# Patient Record
Sex: Female | Born: 1970 | Race: Black or African American | Hispanic: No | Marital: Single | Smoking: Former smoker
Health system: Southern US, Community
[De-identification: ages and names within clinical notes are randomized; demographics above are authoritative.]

## PROBLEM LIST (undated history)

## (undated) DIAGNOSIS — G4730 Sleep apnea, unspecified: Secondary | ICD-10-CM

## (undated) DIAGNOSIS — M1990 Unspecified osteoarthritis, unspecified site: Secondary | ICD-10-CM

## (undated) DIAGNOSIS — M25569 Pain in unspecified knee: Secondary | ICD-10-CM

## (undated) DIAGNOSIS — F319 Bipolar disorder, unspecified: Secondary | ICD-10-CM

## (undated) DIAGNOSIS — F329 Major depressive disorder, single episode, unspecified: Secondary | ICD-10-CM

## (undated) DIAGNOSIS — R51 Headache: Secondary | ICD-10-CM

## (undated) DIAGNOSIS — IMO0001 Reserved for inherently not codable concepts without codable children: Secondary | ICD-10-CM

## (undated) DIAGNOSIS — G4733 Obstructive sleep apnea (adult) (pediatric): Secondary | ICD-10-CM

## (undated) DIAGNOSIS — J189 Pneumonia, unspecified organism: Secondary | ICD-10-CM

## (undated) DIAGNOSIS — F4310 Post-traumatic stress disorder, unspecified: Secondary | ICD-10-CM

## (undated) DIAGNOSIS — F32A Depression, unspecified: Secondary | ICD-10-CM

## (undated) DIAGNOSIS — R519 Headache, unspecified: Secondary | ICD-10-CM

## (undated) DIAGNOSIS — F419 Anxiety disorder, unspecified: Secondary | ICD-10-CM

## (undated) DIAGNOSIS — G8929 Other chronic pain: Secondary | ICD-10-CM

## (undated) DIAGNOSIS — E785 Hyperlipidemia, unspecified: Secondary | ICD-10-CM

## (undated) DIAGNOSIS — J45909 Unspecified asthma, uncomplicated: Secondary | ICD-10-CM

## (undated) DIAGNOSIS — T4145XA Adverse effect of unspecified anesthetic, initial encounter: Secondary | ICD-10-CM

## (undated) DIAGNOSIS — M25579 Pain in unspecified ankle and joints of unspecified foot: Secondary | ICD-10-CM

## (undated) DIAGNOSIS — I10 Essential (primary) hypertension: Secondary | ICD-10-CM

## (undated) DIAGNOSIS — T8859XA Other complications of anesthesia, initial encounter: Secondary | ICD-10-CM

## (undated) DIAGNOSIS — M75100 Unspecified rotator cuff tear or rupture of unspecified shoulder, not specified as traumatic: Secondary | ICD-10-CM

## (undated) DIAGNOSIS — K219 Gastro-esophageal reflux disease without esophagitis: Secondary | ICD-10-CM

## (undated) DIAGNOSIS — Z9989 Dependence on other enabling machines and devices: Secondary | ICD-10-CM

## (undated) HISTORY — DX: Obstructive sleep apnea (adult) (pediatric): G47.33

## (undated) HISTORY — DX: Dependence on other enabling machines and devices: Z99.89

## (undated) HISTORY — DX: Pain in unspecified ankle and joints of unspecified foot: M25.579

## (undated) HISTORY — PX: HERNIA REPAIR: SHX51

## (undated) HISTORY — PX: TUBAL LIGATION: SHX77

## (undated) HISTORY — PX: CERVICAL LAMINECTOMY: SHX94

---

## 1999-06-19 HISTORY — PX: CARPAL TUNNEL RELEASE: SHX101

## 2000-08-08 ENCOUNTER — Encounter: Admission: RE | Admit: 2000-08-08 | Discharge: 2000-11-06 | Payer: Self-pay | Admitting: Specialist

## 2000-08-22 ENCOUNTER — Emergency Department (HOSPITAL_COMMUNITY): Admission: EM | Admit: 2000-08-22 | Discharge: 2000-08-23 | Payer: Self-pay | Admitting: Emergency Medicine

## 2000-12-10 ENCOUNTER — Emergency Department (HOSPITAL_COMMUNITY): Admission: EM | Admit: 2000-12-10 | Discharge: 2000-12-11 | Payer: Self-pay | Admitting: Emergency Medicine

## 2000-12-30 ENCOUNTER — Encounter: Payer: Self-pay | Admitting: Emergency Medicine

## 2000-12-30 ENCOUNTER — Emergency Department (HOSPITAL_COMMUNITY): Admission: EM | Admit: 2000-12-30 | Discharge: 2000-12-30 | Payer: Self-pay | Admitting: Emergency Medicine

## 2001-01-07 ENCOUNTER — Emergency Department (HOSPITAL_COMMUNITY): Admission: EM | Admit: 2001-01-07 | Discharge: 2001-01-07 | Payer: Self-pay | Admitting: Emergency Medicine

## 2001-01-07 ENCOUNTER — Encounter: Payer: Self-pay | Admitting: Emergency Medicine

## 2001-02-21 ENCOUNTER — Emergency Department (HOSPITAL_COMMUNITY): Admission: EM | Admit: 2001-02-21 | Discharge: 2001-02-22 | Payer: Self-pay | Admitting: Emergency Medicine

## 2006-09-22 ENCOUNTER — Emergency Department: Payer: Self-pay | Admitting: Emergency Medicine

## 2006-09-22 ENCOUNTER — Other Ambulatory Visit: Payer: Self-pay

## 2007-01-20 ENCOUNTER — Emergency Department: Payer: Self-pay | Admitting: Internal Medicine

## 2007-03-17 ENCOUNTER — Ambulatory Visit: Payer: Self-pay | Admitting: General Surgery

## 2007-06-14 ENCOUNTER — Emergency Department: Payer: Self-pay | Admitting: Emergency Medicine

## 2008-05-11 ENCOUNTER — Ambulatory Visit: Payer: Self-pay | Admitting: Internal Medicine

## 2008-09-01 ENCOUNTER — Emergency Department: Payer: Self-pay | Admitting: Emergency Medicine

## 2009-08-14 ENCOUNTER — Emergency Department: Payer: Self-pay | Admitting: Emergency Medicine

## 2009-08-26 ENCOUNTER — Emergency Department: Payer: Self-pay | Admitting: Emergency Medicine

## 2010-02-02 ENCOUNTER — Emergency Department: Payer: Self-pay | Admitting: Emergency Medicine

## 2010-02-14 ENCOUNTER — Emergency Department: Payer: Self-pay | Admitting: Emergency Medicine

## 2010-02-23 ENCOUNTER — Emergency Department: Payer: Self-pay | Admitting: Internal Medicine

## 2010-04-05 ENCOUNTER — Emergency Department: Payer: Self-pay | Admitting: Emergency Medicine

## 2010-11-01 ENCOUNTER — Emergency Department (HOSPITAL_COMMUNITY): Payer: Self-pay

## 2010-11-01 ENCOUNTER — Emergency Department (HOSPITAL_COMMUNITY)
Admission: EM | Admit: 2010-11-01 | Discharge: 2010-11-01 | Disposition: A | Discharge status: Home / Self Care | Payer: Self-pay | Attending: Emergency Medicine | Admitting: Emergency Medicine

## 2010-11-01 DIAGNOSIS — Z79899 Other long term (current) drug therapy: Secondary | ICD-10-CM | POA: Insufficient documentation

## 2010-11-01 DIAGNOSIS — F1010 Alcohol abuse, uncomplicated: Secondary | ICD-10-CM | POA: Insufficient documentation

## 2010-11-01 DIAGNOSIS — I10 Essential (primary) hypertension: Secondary | ICD-10-CM | POA: Insufficient documentation

## 2010-11-01 DIAGNOSIS — E119 Type 2 diabetes mellitus without complications: Secondary | ICD-10-CM | POA: Insufficient documentation

## 2010-11-01 DIAGNOSIS — R4182 Altered mental status, unspecified: Secondary | ICD-10-CM | POA: Insufficient documentation

## 2010-11-01 LAB — GLUCOSE, CAPILLARY
Glucose-Capillary: 225 mg/dL — ABNORMAL HIGH (ref 70–99)
Glucose-Capillary: 203 mg/dL — ABNORMAL HIGH (ref 70–99)

## 2010-11-01 LAB — COMPREHENSIVE METABOLIC PANEL
Alkaline Phosphatase: 77 U/L (ref 39–117)
BUN: 9 mg/dL (ref 6–23)
Chloride: 102 mEq/L (ref 96–112)
Glucose, Bld: 205 mg/dL — ABNORMAL HIGH (ref 70–99)
Potassium: 3.6 mEq/L (ref 3.5–5.1)
Total Bilirubin: 0.2 mg/dL — ABNORMAL LOW (ref 0.3–1.2)
Total Protein: 7.2 g/dL (ref 6.0–8.3)

## 2010-11-01 LAB — ETHANOL: Alcohol, Ethyl (B): 112 mg/dL — ABNORMAL HIGH (ref 0–10)

## 2010-11-01 LAB — CBC
HCT: 39.1 % (ref 36.0–46.0)
Hemoglobin: 12.8 g/dL (ref 12.0–15.0)
MCH: 26.3 pg (ref 26.0–34.0)
MCHC: 32.7 g/dL (ref 30.0–36.0)
MCV: 80.3 fL (ref 78.0–100.0)
Platelets: 211 10*3/uL (ref 150–400)
RBC: 4.87 MIL/uL (ref 3.87–5.11)
RDW: 14.5 % (ref 11.5–15.5)
WBC: 6.9 10*3/uL (ref 4.0–10.5)

## 2010-11-01 LAB — DIFFERENTIAL
Basophils Absolute: 0 10*3/uL (ref 0.0–0.1)
Basophils Relative: 0 % (ref 0–1)
Monocytes Absolute: 0.4 10*3/uL (ref 0.1–1.0)
Neutro Abs: 3.6 10*3/uL (ref 1.7–7.7)
Neutrophils Relative %: 52 % (ref 43–77)

## 2010-11-01 LAB — RAPID URINE DRUG SCREEN, HOSP PERFORMED
Amphetamines: NOT DETECTED
Barbiturates: NOT DETECTED
Cocaine: NOT DETECTED
Opiates: NOT DETECTED

## 2010-11-01 LAB — URINALYSIS, ROUTINE W REFLEX MICROSCOPIC
Bilirubin Urine: NEGATIVE
Glucose, UA: NEGATIVE mg/dL
Hgb urine dipstick: NEGATIVE
Ketones, ur: NEGATIVE mg/dL
Nitrite: NEGATIVE
Protein, ur: NEGATIVE mg/dL
Specific Gravity, Urine: 1.007 (ref 1.005–1.030)
Urobilinogen, UA: 0.2 mg/dL (ref 0.0–1.0)
pH: 6.5 (ref 5.0–8.0)

## 2010-11-01 LAB — POCT CARDIAC MARKERS
CKMB, poc: 1.4 ng/mL (ref 1.0–8.0)
Myoglobin, poc: 71.3 ng/mL (ref 12–200)
Troponin i, poc: <0.05 ng/mL (ref 0.00–0.09)

## 2010-11-02 ENCOUNTER — Emergency Department (HOSPITAL_COMMUNITY)
Admission: EM | Admit: 2010-11-02 | Discharge: 2010-11-02 | Disposition: A | Discharge status: Home / Self Care | Payer: Self-pay | Attending: Emergency Medicine | Admitting: Emergency Medicine

## 2010-11-02 ENCOUNTER — Emergency Department (HOSPITAL_COMMUNITY): Payer: Self-pay

## 2010-11-02 DIAGNOSIS — R5383 Other fatigue: Secondary | ICD-10-CM | POA: Insufficient documentation

## 2010-11-02 DIAGNOSIS — R112 Nausea with vomiting, unspecified: Secondary | ICD-10-CM | POA: Insufficient documentation

## 2010-11-02 DIAGNOSIS — R5381 Other malaise: Secondary | ICD-10-CM | POA: Insufficient documentation

## 2010-11-02 DIAGNOSIS — R079 Chest pain, unspecified: Secondary | ICD-10-CM | POA: Insufficient documentation

## 2010-11-02 DIAGNOSIS — I10 Essential (primary) hypertension: Secondary | ICD-10-CM | POA: Insufficient documentation

## 2010-11-02 DIAGNOSIS — E119 Type 2 diabetes mellitus without complications: Secondary | ICD-10-CM | POA: Insufficient documentation

## 2010-11-02 DIAGNOSIS — Z79899 Other long term (current) drug therapy: Secondary | ICD-10-CM | POA: Insufficient documentation

## 2010-11-02 LAB — COMPREHENSIVE METABOLIC PANEL
ALT: 15 U/L (ref 0–35)
CO2: 29 mEq/L (ref 19–32)
Calcium: 9.7 mg/dL (ref 8.4–10.5)
Creatinine, Ser: 0.87 mg/dL (ref 0.4–1.2)
GFR calc non Af Amer: >60 mL/min (ref >60)
Glucose, Bld: 176 mg/dL — ABNORMAL HIGH (ref 70–99)
Sodium: 137 mEq/L (ref 135–145)

## 2010-11-02 LAB — POCT PREGNANCY, URINE: Preg Test, Ur: NEGATIVE

## 2010-11-02 LAB — DIFFERENTIAL
Basophils Absolute: 0 10*3/uL (ref 0.0–0.1)
Eosinophils Absolute: 0.1 10*3/uL (ref 0.0–0.7)
Eosinophils Relative: 1 % (ref 0–5)

## 2010-11-02 LAB — CBC
Platelets: 199 10*3/uL (ref 150–400)
RDW: 15 % (ref 11.5–15.5)
WBC: 6.1 10*3/uL (ref 4.0–10.5)

## 2010-11-02 LAB — GLUCOSE, CAPILLARY
Glucose-Capillary: 174 mg/dL — ABNORMAL HIGH (ref 70–99)
Glucose-Capillary: 118 mg/dL — ABNORMAL HIGH (ref 70–99)

## 2010-11-02 LAB — LIPASE, BLOOD: Lipase: 17 U/L (ref 11–59)

## 2010-11-02 LAB — URINALYSIS, ROUTINE W REFLEX MICROSCOPIC
Bilirubin Urine: NEGATIVE
Nitrite: NEGATIVE
Specific Gravity, Urine: 1.021 (ref 1.005–1.030)
Urobilinogen, UA: 0.2 mg/dL (ref 0.0–1.0)

## 2010-11-02 LAB — POCT CARDIAC MARKERS: Myoglobin, poc: 72.6 ng/mL (ref 12–200)

## 2010-11-02 LAB — URINE MICROSCOPIC-ADD ON

## 2010-11-21 ENCOUNTER — Emergency Department (HOSPITAL_COMMUNITY)
Admission: EM | Admit: 2010-11-21 | Discharge: 2010-11-22 | Disposition: A | Discharge status: Home / Self Care | Payer: Medicaid Other | Attending: Emergency Medicine | Admitting: Emergency Medicine

## 2010-11-21 ENCOUNTER — Emergency Department (HOSPITAL_COMMUNITY): Payer: Medicaid Other

## 2010-11-21 DIAGNOSIS — IMO0002 Reserved for concepts with insufficient information to code with codable children: Secondary | ICD-10-CM | POA: Insufficient documentation

## 2010-11-21 DIAGNOSIS — M25569 Pain in unspecified knee: Secondary | ICD-10-CM | POA: Insufficient documentation

## 2010-11-21 DIAGNOSIS — I10 Essential (primary) hypertension: Secondary | ICD-10-CM | POA: Insufficient documentation

## 2010-11-21 DIAGNOSIS — M25469 Effusion, unspecified knee: Secondary | ICD-10-CM | POA: Insufficient documentation

## 2010-11-21 DIAGNOSIS — E669 Obesity, unspecified: Secondary | ICD-10-CM | POA: Insufficient documentation

## 2010-11-21 DIAGNOSIS — E119 Type 2 diabetes mellitus without complications: Secondary | ICD-10-CM | POA: Insufficient documentation

## 2010-11-21 DIAGNOSIS — M1710 Unilateral primary osteoarthritis, unspecified knee: Secondary | ICD-10-CM | POA: Insufficient documentation

## 2010-12-05 ENCOUNTER — Emergency Department (HOSPITAL_COMMUNITY)
Admission: EM | Admit: 2010-12-05 | Discharge: 2010-12-05 | Disposition: A | Discharge status: Home / Self Care | Payer: Worker's Compensation | Attending: Emergency Medicine | Admitting: Emergency Medicine

## 2010-12-05 DIAGNOSIS — S61209A Unspecified open wound of unspecified finger without damage to nail, initial encounter: Secondary | ICD-10-CM | POA: Insufficient documentation

## 2010-12-05 DIAGNOSIS — E119 Type 2 diabetes mellitus without complications: Secondary | ICD-10-CM | POA: Insufficient documentation

## 2010-12-05 DIAGNOSIS — Y9289 Other specified places as the place of occurrence of the external cause: Secondary | ICD-10-CM | POA: Insufficient documentation

## 2010-12-05 DIAGNOSIS — M79609 Pain in unspecified limb: Secondary | ICD-10-CM | POA: Insufficient documentation

## 2010-12-05 DIAGNOSIS — W260XXA Contact with knife, initial encounter: Secondary | ICD-10-CM | POA: Insufficient documentation

## 2010-12-05 DIAGNOSIS — I10 Essential (primary) hypertension: Secondary | ICD-10-CM | POA: Insufficient documentation

## 2010-12-05 DIAGNOSIS — E669 Obesity, unspecified: Secondary | ICD-10-CM | POA: Insufficient documentation

## 2011-07-26 ENCOUNTER — Encounter (HOSPITAL_COMMUNITY): Payer: Self-pay | Admitting: Emergency Medicine

## 2011-07-26 ENCOUNTER — Emergency Department (HOSPITAL_COMMUNITY): Payer: Medicaid Other

## 2011-07-26 ENCOUNTER — Emergency Department (HOSPITAL_COMMUNITY)
Admission: EM | Admit: 2011-07-26 | Discharge: 2011-07-26 | Disposition: A | Discharge status: Home / Self Care | Payer: Medicaid Other | Attending: Emergency Medicine | Admitting: Emergency Medicine

## 2011-07-26 DIAGNOSIS — I10 Essential (primary) hypertension: Secondary | ICD-10-CM | POA: Insufficient documentation

## 2011-07-26 DIAGNOSIS — R059 Cough, unspecified: Secondary | ICD-10-CM | POA: Insufficient documentation

## 2011-07-26 DIAGNOSIS — Z79899 Other long term (current) drug therapy: Secondary | ICD-10-CM | POA: Insufficient documentation

## 2011-07-26 DIAGNOSIS — J45909 Unspecified asthma, uncomplicated: Secondary | ICD-10-CM | POA: Insufficient documentation

## 2011-07-26 DIAGNOSIS — J3489 Other specified disorders of nose and nasal sinuses: Secondary | ICD-10-CM | POA: Insufficient documentation

## 2011-07-26 DIAGNOSIS — E119 Type 2 diabetes mellitus without complications: Secondary | ICD-10-CM | POA: Insufficient documentation

## 2011-07-26 DIAGNOSIS — R05 Cough: Secondary | ICD-10-CM | POA: Insufficient documentation

## 2011-07-26 HISTORY — DX: Essential (primary) hypertension: I10

## 2011-07-26 MED ORDER — IPRATROPIUM BROMIDE 0.02 % IN SOLN
0.5000 mg | Freq: Once | RESPIRATORY_TRACT | Status: AC
  Administered 2011-07-26: 0.5 mg via RESPIRATORY_TRACT
  Filled 2011-07-26: qty 2.5

## 2011-07-26 MED ORDER — ALBUTEROL SULFATE (5 MG/ML) 0.5% IN NEBU
5.0000 mg | INHALATION_SOLUTION | Freq: Once | RESPIRATORY_TRACT | Status: AC
  Administered 2011-07-26: 5 mg via RESPIRATORY_TRACT
  Filled 2011-07-26: qty 1

## 2011-07-26 MED ORDER — ALBUTEROL SULFATE HFA 108 (90 BASE) MCG/ACT IN AERS: 2.0000 | INHALATION_SPRAY | RESPIRATORY_TRACT | Status: DC | PRN

## 2011-07-26 MED ORDER — PREDNISONE 10 MG PO TABS: 20.0000 mg | ORAL_TABLET | Freq: Every day | ORAL | Status: DC

## 2011-07-26 MED ORDER — PREDNISONE 20 MG PO TABS
60.0000 mg | ORAL_TABLET | Freq: Once | ORAL | Status: AC
  Administered 2011-07-26: 60 mg via ORAL
  Filled 2011-07-26: qty 3

## 2011-07-26 NOTE — ED Notes (Signed)
Pt c/o head congestion and cough x several days; pt sts some wheezing but no hx of asthma

## 2011-07-26 NOTE — ED Provider Notes (Signed)
History     CSN: [id number]  Arrival date & time 07/26/11  1048   First MD Initiated Contact with Patient 07/26/11 1143      Chief Complaint  Patient presents with  . URI     HPI Pt c/o head congestion and cough x several days; pt sts some wheezing but no hx of asthma   Past Medical History  Diagnosis Date  . Diabetes mellitus   . Hypertension     History reviewed. No pertinent past surgical history.  History reviewed. No pertinent family history.  History  Substance Use Topics  . Smoking status: Never Smoker   . Smokeless tobacco: Not on file  . Alcohol Use: No    OB History    Grav Para Term Preterm Abortions TAB SAB Ect Mult Living                  Review of Systems Negative except as noted in history of present illness Allergies  Review of patient's allergies indicates no known allergies.  Home Medications   Current Outpatient Rx  Name Route Sig Dispense Refill  . ENALAPRIL MALEATE 10 MG PO TABS Oral Take 10 mg by mouth daily.    Marland Kitchen GLIPIZIDE ER 10 MG PO TB24 Oral Take 10 mg by mouth daily.    Marland Kitchen METFORMIN HCL 500 MG PO TABS Oral Take 1,000 mg by mouth 2 (two) times daily with a meal.    . ALBUTEROL SULFATE HFA 108 (90 BASE) MCG/ACT IN AERS Inhalation Inhale 2 puffs into the lungs every 4 (four) hours as needed for wheezing. 1 Inhaler 0  . PREDNISONE 10 MG PO TABS Oral Take 2 tablets (20 mg total) by mouth daily. 8 tablet 0    BP 170/100  Pulse 68  Temp(Src) 97.6 F (36.4 C) (Oral)  Resp 20  SpO2 99%  LMP 07/24/2011  Physical Exam  Nursing note and vitals reviewed. Constitutional: She is oriented to person, place, and time. She appears well-developed and well-nourished. No distress.  HENT:  Head: Normocephalic and atraumatic.  Eyes: Pupils are equal, round, and reactive to light.  Neck: Normal range of motion.  Cardiovascular: Normal rate and intact distal pulses.   Pulmonary/Chest: Effort normal. No respiratory distress. She has wheezes. She  exhibits no tenderness.  Abdominal: Normal appearance. She exhibits no distension.  Musculoskeletal: Normal range of motion.  Neurological: She is alert and oriented to person, place, and time. No cranial nerve deficit.  Skin: Skin is warm and dry. No rash noted.  Psychiatric: She has a normal mood and affect. Her behavior is normal.    ED Course  Procedures (including critical care time)  Labs Reviewed - No data to display Dg Chest 2 View  07/26/2011  *RADIOLOGY REPORT*  Clinical Data: Cough and wheezing.  Fever.  CHEST - 2 VIEW  Comparison: Chest x-ray of 11/02/2010.  Findings: Lung volumes are normal.  No consolidative airspace disease.  No pleural effusions.  No pneumothorax.  No pulmonary nodule or mass noted.  Pulmonary vasculature and the cardiomediastinal silhouette are within normal limits.  IMPRESSION: 1. No radiographic evidence of acute cardiopulmonary disease.  Original Report Authenticated By: Florencia Reasons, M.D.     1. Asthma       MDM          Nelia Shi, MD 07/26/11 [phone]

## 2011-10-18 ENCOUNTER — Encounter (HOSPITAL_COMMUNITY): Payer: Self-pay | Admitting: *Deleted

## 2011-10-18 ENCOUNTER — Emergency Department (HOSPITAL_COMMUNITY)
Admission: EM | Admit: 2011-10-18 | Discharge: 2011-10-19 | Disposition: A | Discharge status: Home / Self Care | Payer: Medicaid Other | Attending: Emergency Medicine | Admitting: Emergency Medicine

## 2011-10-18 DIAGNOSIS — I10 Essential (primary) hypertension: Secondary | ICD-10-CM | POA: Insufficient documentation

## 2011-10-18 DIAGNOSIS — E119 Type 2 diabetes mellitus without complications: Secondary | ICD-10-CM | POA: Insufficient documentation

## 2011-10-18 DIAGNOSIS — R079 Chest pain, unspecified: Secondary | ICD-10-CM | POA: Insufficient documentation

## 2011-10-18 DIAGNOSIS — M79609 Pain in unspecified limb: Secondary | ICD-10-CM

## 2011-10-18 DIAGNOSIS — M7989 Other specified soft tissue disorders: Secondary | ICD-10-CM | POA: Insufficient documentation

## 2011-10-18 DIAGNOSIS — M25559 Pain in unspecified hip: Secondary | ICD-10-CM | POA: Insufficient documentation

## 2011-10-18 NOTE — ED Notes (Signed)
Patient with three weeks of right hip/side pain that radiates through her whole right side.  Pain is worse when patient puts weight on the right leg.

## 2011-10-19 ENCOUNTER — Ambulatory Visit (HOSPITAL_COMMUNITY)
Admission: RE | Admit: 2011-10-19 | Discharge: 2011-10-19 | Disposition: A | Discharge status: Home / Self Care | Payer: Medicaid Other | Source: Ambulatory Visit | Attending: Emergency Medicine | Admitting: Emergency Medicine

## 2011-10-19 ENCOUNTER — Emergency Department (HOSPITAL_COMMUNITY): Payer: Medicaid Other

## 2011-10-19 DIAGNOSIS — M79609 Pain in unspecified limb: Secondary | ICD-10-CM | POA: Insufficient documentation

## 2011-10-19 DIAGNOSIS — M79606 Pain in leg, unspecified: Secondary | ICD-10-CM

## 2011-10-19 LAB — URINALYSIS, ROUTINE W REFLEX MICROSCOPIC
Bilirubin Urine: NEGATIVE
Ketones, ur: NEGATIVE mg/dL
Nitrite: NEGATIVE
Urobilinogen, UA: 1 mg/dL (ref 0.0–1.0)

## 2011-10-19 LAB — URINE MICROSCOPIC-ADD ON

## 2011-10-19 MED ORDER — ONDANSETRON HCL 4 MG PO TABS: 4.0000 mg | ORAL_TABLET | Freq: Four times a day (QID) | ORAL | Status: DC

## 2011-10-19 MED ORDER — HYDROCODONE-ACETAMINOPHEN 5-325 MG PO TABS: 1.0000 | ORAL_TABLET | ORAL | Status: DC | PRN

## 2011-10-19 MED ORDER — ONDANSETRON 4 MG PO TBDP: 4.0000 mg | ORAL_TABLET | Freq: Once | ORAL | Status: DC

## 2011-10-19 MED ORDER — HYDROMORPHONE HCL PF 1 MG/ML IJ SOLN: 1.0000 mg | Freq: Once | INTRAMUSCULAR | Status: DC

## 2011-10-19 MED ORDER — ONDANSETRON HCL 4 MG/2ML IJ SOLN
4.0000 mg | Freq: Once | INTRAMUSCULAR | Status: AC
  Administered 2011-10-19: 4 mg via INTRAVENOUS
  Filled 2011-10-19: qty 2

## 2011-10-19 MED ORDER — HYDROMORPHONE HCL PF 1 MG/ML IJ SOLN
1.0000 mg | Freq: Once | INTRAMUSCULAR | Status: AC
  Administered 2011-10-19: 1 mg via INTRAVENOUS
  Filled 2011-10-19: qty 1

## 2011-10-19 NOTE — ED Provider Notes (Signed)
History     CSN: [id number]  Arrival date & time 10/18/11  2245   First MD Initiated Contact with Patient 10/18/11 2333      Chief Complaint  Patient presents with  . Hip Pain  . Chest Pain    (Consider location/radiation/quality/duration/timing/severity/associated sxs/prior treatment) HPI  Patient presents to the ED for severe right side calf pain that radiates up towards her hip, then up her back, towards her shoulder blades, and then around the side to her chest. She states that her pain has been going on for 3 weeks. She has been afebrile. She denies previous history of DVT but admits to a family history. She admits to her calf being very tender. She denies recent long trip, surgery, or cough/chest pressure.   Past Medical History  Diagnosis Date  . Diabetes mellitus   . Hypertension     History reviewed. No pertinent past surgical history.  History reviewed. No pertinent family history.  History  Substance Use Topics  . Smoking status: Never Smoker   . Smokeless tobacco: Not on file  . Alcohol Use: No    OB History    Grav Para Term Preterm Abortions TAB SAB Ect Mult Living                  Review of Systems   HEENT: denies blurry vision or change in hearing PULMONARY: Denies difficulty breathing and SOB CARDIAC: denies chest pain or heart palpitations MUSCULOSKELETAL:  denies being unable to ambulate ABDOMEN AL: denies abdominal pain GU: denies loss of bowel or urinary control NEURO: denies numbness and tingling in extremities   Allergies  Review of patient's allergies indicates no known allergies.  Home Medications   Current Outpatient Rx  Name Route Sig Dispense Refill  . ALBUTEROL SULFATE HFA 108 (90 BASE) MCG/ACT IN AERS Inhalation Inhale 2 puffs into the lungs every 4 (four) hours as needed for wheezing. 1 Inhaler 0  . ENALAPRIL MALEATE 10 MG PO TABS Oral Take 10 mg by mouth daily.    Marland Kitchen GLIPIZIDE ER 10 MG PO TB24 Oral Take 10 mg by mouth  daily.    Marland Kitchen METFORMIN HCL 500 MG PO TABS Oral Take 1,000 mg by mouth 2 (two) times daily with a meal.    . PREDNISONE 10 MG PO TABS Oral Take 2 tablets (20 mg total) by mouth daily. 8 tablet 0    BP 148/93  Pulse 84  Temp(Src) 98.1 F (36.7 C) (Oral)  Resp 16  SpO2 97%  Physical Exam  Nursing note and vitals reviewed. Constitutional: She appears well-developed and well-nourished. No distress.  HENT:  Head: Normocephalic and atraumatic.  Eyes: Pupils are equal, round, and reactive to light.  Neck: Normal range of motion. Neck supple.  Cardiovascular: Normal rate and regular rhythm.   Pulmonary/Chest: Effort normal.  Abdominal: Soft.  Musculoskeletal:       Right lower leg: She exhibits tenderness and swelling (less than 3 cm of swelling). She exhibits no bony tenderness, no edema, no deformity and no laceration.        Equal strength to bilateral lower extremities. Neurosensory function adequate to both legs. Skin color is normal. Skin is warm and moist. I see no step off deformity, no bony tenderness. Pt is able to ambulate without limp. Pain is relieved when sitting in certain positions. ROM is decreased due to pain. No crepitus, laceration, effusion, swelling.  Pulses are normal   Neurological: She is alert.  Skin: Skin is  warm and dry.    ED Course  Procedures (including critical care time)  Labs Reviewed  URINALYSIS, ROUTINE W REFLEX MICROSCOPIC - Abnormal; Notable for the following:    Protein, ur 100 (*)    All other components within normal limits  URINE MICROSCOPIC-ADD ON - Abnormal; Notable for the following:    Squamous Epithelial / LPF FEW (*)    All other components within normal limits  PREGNANCY, URINE   Dg Hip Complete Right  10/19/2011  *RADIOLOGY REPORT*  Clinical Data: Right-sided migratory hip pain  RIGHT HIP - COMPLETE 2+ VIEW  Comparison: None.  Findings:  No fracture or dislocation.  Mild degenerative changes of the right hip with joint space loss,  subchondral sclerosis and osteophytosis. Minimal enthesopathic change/ prior avulsion injury at the greater trochanter.  Limited visualization of the pelvis is normal. Limited visualization of the bilateral SI joints and pubic symphysis is normal.  Suspect mild degenerative change within the contralateral left hip. Limited visualization of lower lumbar spine is normal.  Regional bowel gas pattern is normal.  IMPRESSION: 1.  Mild degenerative change of the right hip.  2.   Minimal enthesopathic change/prior avulsion injury at the greater trochanter.  Original Report Authenticated By: Waynard Reeds, M.D.     1. Extremity pain       MDM   . Date: 10/19/2011  Rate: 76  Rhythm: normal sinus rhythm  QRS Axis: normal  Intervals: normal  ST/T Wave abnormalities: normal  Conduction Disutrbances:none  Narrative Interpretation: Probable left atrial abnormality  Old EKG Reviewed: unchanged Nov 02, 2010  Patients pain managed in ED with strong pain medication.   According to the [facility] Criteria patient is low risk for a DVT with a score of zero. Due to her continued pain and swelling, I will bring her back to [facility] tomorrow for lower extremity dopplers to r/o DVT although unlikely. Patients pain being treated in the ED. Patient also given pain prescription for home. Xray of the hip shows arthritis and an old mild avulsion fracture.   This patient was discussed with Dr. Fonnie Jarvis who agrees with my treatment and plan.  Pt has been advised of the symptoms that warrant their return to the ED. Patient has voiced understanding and has agreed to follow-up with the PCP or specialist.         Dorthula Matas, PA 10/19/11 [phone]

## 2011-10-19 NOTE — ED Notes (Addendum)
Pt stated that she has been having right calf pain x 3 weeks. Pain is now radiating to her rt hip and back x 2 weeks. Pt stated that she has not had any trauma or injury to rt leg or hip. No swelling or warmness to leg noted. She has also been having constant dull left chest pain x 4 days. No n/v. Pt stated that she has intermittent SOB with CP. CP does not radiate. Will continue to monitor.

## 2011-10-19 NOTE — Progress Notes (Signed)
VASCULAR LAB PRELIMINARY  PRELIMINARY  PRELIMINARY  PRELIMINARY  Bilateral lower extremity venous duplex completed.    Preliminary report: Bilateral:  No evidence of DVT, superficial thrombosis, or Baker's Cyst.   Shailah Gibbins D, RVS 10/19/2011, 2:13 PM

## 2011-10-19 NOTE — ED Notes (Signed)
EDP at bedside  

## 2011-10-19 NOTE — Discharge Instructions (Signed)
Deep Vein Thrombosis A deep vein thrombosis (DVT) is a blood clot (thrombus) that develops in a deep vein. A DVT is a clot in the deep, larger veins of the leg, arm, or pelvis. These are more dangerous than clots that might form in veins on the surface of the body. Deep vein thrombosis can lead to complications if the clot breaks off and travels in the bloodstream to the lungs. CAUSES Blood clots form in a vein for different reasons. Usually several things cause blood clots. They include:  The flow of blood slows down.   The inside of the vein is damaged in some way.   The person has a condition that makes blood clot more easily. These conditions may include:   Older age (especially over 75 years old).   Having a history of blood clots.   Having major or lengthy surgery. Hip surgery is particularly high-risk.   Breaking a hip or leg.   Sitting or lying still for a long time.   Cancer or cancer treatment.   Having a long, thin tube (catheter) placed inside a vein during a medical procedure.   Being overweight (obese).   Pregnancy and childbirth.   Medicines with estrogen.   Smoking.   Other circulation or heart problems.  SYMPTOMS When a clot forms, it can either partially or totally block the blood flow in that vein. Symptoms of a DVT can include:  Swelling of the leg or arm, especially if one side is much worse.   Warmth and redness of the leg or arm, especially if one side is much worse.   Pain in an arm or leg. If the clot is in the leg, symptoms may be more noticeable or worse when standing or walking.  If the blood clot travels to the lung, it may cause:  Shortness of breath.   Chest pain. The pain may be worsened by deep breaths.   Coughing up thick mucus (phlegm), possibly flecked with blood.  Anyone with these symptoms should get emergency medical treatment right away. Call your local emergency services (911 in [place] if you have these symptoms. DIAGNOSIS If  a DVT is suspected, your caregiver will take a full medical history. He or she will also perform a physical exam. Tests that also may be required include:  Studies of the clotting properties of the blood.   An ultrasound scan.   X-rays to show the flow of blood when special dye is injected into the veins (venography).   Studies of your lungs if you have any chest symptoms.  PREVENTION  Exercise the legs regularly. Take a brisk 30 minute walk every day.   Maintain a weight that is appropriate for your height.   Avoid sitting or lying in bed for long periods of time without moving your legs.   Women, particularly those over the age of 35, should consider the risks and benefits of taking estrogen medicines, including birth control pills.   Do not smoke, especially if you take estrogen medicines.   Long-distance travel can increase your risk. You should exercise your legs by walking or pumping the muscles every hour.   In hospital prevention:   Prevention may include medical and nonmedical measures.  TREATMENT  The most common treatment for DVT is blood thinning (anticoagulant) medicine, which reduces the blood's tendency to clot. Anticoagulants can stop new blood clots from forming and old ones from growing. They cannot dissolve existing clots. Your body does this by itself over time.   Anticoagulants can be given by mouth, by intravenous (IV) access, or by injection. Your caregiver will determine the best program for you.   Less commonly, clot-dissolving drugs (thrombolytics) are used to dissolve a DVT. They carry a high risk of bleeding, so they are used mainly in severe cases.   Very rarely, a blood clot in the leg needs to be removed surgically.   If you are unable to take anticoagulants, your caregiver may arrange for you to have a filter placed in a main vein in your belly (abdomen). This filter prevents clots from traveling to your lungs.  HOME CARE INSTRUCTIONS  Take all  medicines prescribed by your caregiver. Follow the directions carefully.   You will most likely continue taking anticoagulants after you leave the hospital. Your caregiver will advise you on the length of treatment (usually 3 to 6 months, sometimes for life).   Taking too much or too little of an anticoagulant is dangerous. While taking this type of medicine, you will need to have regular blood tests to be sure the dose is correct. The dose can change for many reasons. It is critically important that you take this medicine exactly as prescribed, and that you have blood tests exactly as directed.   Many foods can interfere with anticoagulants. These include foods high in vitamin K, such as spinach, kale, broccoli, cabbage, collard and turnip greens, Brussels sprouts, peas, cauliflower, seaweed, parsley, beef and pork liver, green tea, and soybean oil. Your caregiver should discuss limits on these foods with you or you should arrange a visit with a dietician to answer your questions.   Many medicines can interfere with anticoagulants. You must tell your caregiver about any and all medicines you take. This includes all vitamins and supplements. Be especially cautious with aspirin and anti-inflammatory medicines. Ask your caregiver before taking these.   Anticoagulants can have side effects, mostly excessive bruising or bleeding. You will need to hold pressure over cuts for longer than usual. Avoid alcoholic drinks or consume only very small amounts while taking this medicine.   If you are taking an anticoagulant:   Wear a medical alert bracelet.   Notify your dentist or other caregivers before procedures.   Avoid contact sports.   Ask your caregiver how soon you can go back to normal activities. Not being active can lead to new clots. Ask for a list of what you should and should not do.   Exercise your lower leg muscles. This is important while traveling.   You may need to wear compression  stockings. These are tight elastic stockings that apply pressure to the lower legs. This can help keep the blood in the legs from clotting.   If you are a smoker, you should quit.   Learn as much as you can about DVT.  SEEK MEDICAL CARE IF:  You have unusual bruising or any bleeding problems.   The swelling or pain in your affected arm or leg is not gradually improving.   You anticipate surgery or long-distance travel. You should get specific advice on DVT prevention.   You discover other family members with blood clots. This may require further testing for inherited diseases or conditions.  SEEK IMMEDIATE MEDICAL CARE IF:  You develop chest pain.   You develop severe shortness of breath.   You begin to cough up bloody mucus or phlegm (sputum).   You feel dizzy or faint.   You develop swelling or pain in the leg.   You have   breathing problems after traveling.  MAKE SURE YOU:  Understand these instructions.   Will watch your condition.   Will get help right away if you are not doing well or get worse.  Document Released: 06/04/2005 Document Revised: 05/24/2011 Document Reviewed: 07/27/2010 Exit[employer] Patient Information 2012 [employer], LLPain of Unknown Etiology (Pain Without a Known Cause) You have come to your caregiver because of pain. Pain can occur in any part of the body. Often there is not a definite cause. If your laboratory (blood or urine) work was normal and x-rays or other studies were normal, your caregiver may treat you without knowing the cause of the pain. An example of this is the headache. Most headaches are diagnosed by taking a history. This means your caregiver asks you questions about your headaches. Your caregiver determines a treatment based on your answers. Usually testing done for headaches is normal. Often testing is not done unless there is no response to medications. Regardless of where your pain is located today, you can be given medications to make you  comfortable. If no physical cause of pain can be found, most cases of pain will gradually leave as suddenly as they came.  If you have a painful condition and no reason can be found for the pain, It is importantthat you follow up with your caregiver. If the pain becomes worse or does not go away, it may be necessary to repeat tests and look further for a possible cause.  Only take over-the-counter or prescription medicines for pain, discomfort, or fever as directed by your caregiver.   For the protection of your privacy, test results can not be given over the phone. Make sure you receive the results of your test. Ask as to how these results are to be obtained if you have not been informed. It is your responsibility to obtain your test results.   You may continue all activities unless the activities cause more pain. When the pain lessens, it is important to gradually resume normal activities. Resume activities by beginning slowly and gradually increasing the intensity and duration of the activities or exercise. During periods of severe pain, bed-rest may be helpful. Lay or sit in any position that is comfortable.   Ice used for acute (sudden) conditions may be effective. Use a large plastic bag filled with ice and wrapped in a towel. This may provide pain relief.   See your caregiver for continued problems. They can help or refer you for exercises or physical therapy if necessary.  If you were given medications for your condition, do not drive, operate machinery or power tools, or sign legal documents for 24 hours. Do not drink alcohol, take sleeping pills, or take other medications that may interfere with treatment. See your caregiver immediately if you have pain that is becoming worse and not relieved by medications. Document Released: 02/27/2001 Document Revised: 05/24/2011 Document Reviewed: 06/04/2005 [facility] Patient Information 2012 [place], [place].C.

## 2011-10-19 NOTE — ED Notes (Signed)
EKG done by EMT R Manson Passey Old and New EKG given to Dr Fonnie Jarvis

## 2011-10-21 ENCOUNTER — Encounter (HOSPITAL_COMMUNITY): Payer: Self-pay | Admitting: Emergency Medicine

## 2011-10-21 ENCOUNTER — Emergency Department (HOSPITAL_COMMUNITY): Payer: Medicaid Other

## 2011-10-21 ENCOUNTER — Emergency Department (HOSPITAL_COMMUNITY)
Admission: EM | Admit: 2011-10-21 | Discharge: 2011-10-21 | Disposition: A | Discharge status: Home / Self Care | Payer: Medicaid Other | Attending: Emergency Medicine | Admitting: Emergency Medicine

## 2011-10-21 DIAGNOSIS — M79609 Pain in unspecified limb: Secondary | ICD-10-CM | POA: Insufficient documentation

## 2011-10-21 DIAGNOSIS — R0602 Shortness of breath: Secondary | ICD-10-CM | POA: Insufficient documentation

## 2011-10-21 DIAGNOSIS — I10 Essential (primary) hypertension: Secondary | ICD-10-CM | POA: Insufficient documentation

## 2011-10-21 DIAGNOSIS — M7989 Other specified soft tissue disorders: Secondary | ICD-10-CM | POA: Insufficient documentation

## 2011-10-21 DIAGNOSIS — R0789 Other chest pain: Secondary | ICD-10-CM | POA: Insufficient documentation

## 2011-10-21 DIAGNOSIS — M76891 Other specified enthesopathies of right lower limb, excluding foot: Secondary | ICD-10-CM

## 2011-10-21 DIAGNOSIS — E119 Type 2 diabetes mellitus without complications: Secondary | ICD-10-CM | POA: Insufficient documentation

## 2011-10-21 LAB — URINALYSIS, ROUTINE W REFLEX MICROSCOPIC
Bilirubin Urine: NEGATIVE
Ketones, ur: NEGATIVE mg/dL
Nitrite: NEGATIVE
Protein, ur: NEGATIVE mg/dL
Specific Gravity, Urine: 1.007 (ref 1.005–1.030)
Urobilinogen, UA: 1 mg/dL (ref 0.0–1.0)

## 2011-10-21 LAB — CBC
HCT: 40.8 % (ref 36.0–46.0)
MCV: 83.6 fL (ref 78.0–100.0)
Platelets: 194 10*3/uL (ref 150–400)
RBC: 4.88 MIL/uL (ref 3.87–5.11)
WBC: 7.3 10*3/uL (ref 4.0–10.5)

## 2011-10-21 LAB — COMPREHENSIVE METABOLIC PANEL
ALT: 14 U/L (ref 0–35)
AST: 13 U/L (ref 0–37)
Alkaline Phosphatase: 69 U/L (ref 39–117)
CO2: 24 mEq/L (ref 19–32)
Calcium: 9.4 mg/dL (ref 8.4–10.5)
Chloride: 102 mEq/L (ref 96–112)
GFR calc Af Amer: 85 mL/min — ABNORMAL LOW (ref >90)
GFR calc non Af Amer: 73 mL/min — ABNORMAL LOW (ref >90)
Glucose, Bld: 97 mg/dL (ref 70–99)
Sodium: 136 mEq/L (ref 135–145)
Total Bilirubin: 0.2 mg/dL — ABNORMAL LOW (ref 0.3–1.2)

## 2011-10-21 LAB — DIFFERENTIAL
Eosinophils Relative: 2 % (ref 0–5)
Lymphocytes Relative: 34 % (ref 12–46)
Lymphs Abs: 2.5 10*3/uL (ref 0.7–4.0)
Neutro Abs: 4.4 10*3/uL (ref 1.7–7.7)

## 2011-10-21 LAB — URINE MICROSCOPIC-ADD ON

## 2011-10-21 MED ORDER — NAPROXEN 500 MG PO TABS: 500.0000 mg | ORAL_TABLET | Freq: Two times a day (BID) | ORAL | Status: DC

## 2011-10-21 MED ORDER — HYDROMORPHONE HCL PF 2 MG/ML IJ SOLN
1.0000 mg | Freq: Once | INTRAMUSCULAR | Status: AC
  Administered 2011-10-21: 1 mg via INTRAMUSCULAR
  Filled 2011-10-21: qty 1

## 2011-10-21 NOTE — Discharge Instructions (Signed)
Tendinitis  Tendinitis is swelling and inflammation of the tendons. Tendons are band-like tissues that connect muscle to bone. Tendinitis commonly occurs in the:    Shoulders (rotator cuff).   Heels (Achilles tendon).   Elbows (triceps tendon).  CAUSES  Tendinitis is usually caused by overusing the tendon, muscles, and joints involved. When the tissue surrounding a tendon (synovium) becomes inflamed, it is called tenosynovitis. Tendinitis commonly develops in people whose jobs require repetitive motions.  SYMPTOMS   Pain.   Tenderness.   Mild swelling.  DIAGNOSIS  Tendinitis is usually diagnosed by physical exam. Your caregiver may also order X-rays or other imaging tests.  TREATMENT  Your caregiver may recommend certain medicines or exercises for your treatment.  HOME CARE INSTRUCTIONS    Use a sling or splint for as long as directed by your caregiver until the pain decreases.   Put ice on the injured area.   Put ice in a plastic bag.   Place a towel between your skin and the bag.   Leave the ice on for 15 to 20 minutes, 3 to 4 times a day.   Avoid using the limb while the tendon is painful. Perform gentle range of motion exercises only as directed by your caregiver. Stop exercises if pain or discomfort increase, unless directed otherwise by your caregiver.   Only take over-the-counter or prescription medicines for pain, discomfort, or fever as directed by your caregiver.  SEEK MEDICAL CARE IF:    Your pain and swelling increase.   You develop new, unexplained symptoms, especially increased numbness in the hands.  MAKE SURE YOU:    Understand these instructions.   Will watch your condition.   Will get help right away if you are not doing well or get worse.  Document Released: 06/01/2000 Document Revised: 05/24/2011 Document Reviewed: 08/21/2010  [employer] Patient Information 2012 [employer].

## 2011-10-21 NOTE — ED Notes (Signed)
C/o pain and swelling to posterior R upper leg x 3 weeks.  States she was seen in ED this past week and came back for doppler and that they did not see a blood clot.  States pain and swelling is now going up leg more, into back , and has bilateral shoulder pain.

## 2011-10-21 NOTE — ED Provider Notes (Signed)
Medical screening examination/treatment/procedure(s) were performed by non-physician practitioner and as supervising physician I was immediately available for consultation/collaboration.  Iban Utz R. Samyah Bilbo, MD 10/21/11 2323 

## 2011-10-21 NOTE — ED Provider Notes (Signed)
History     CSN: [id number]  Arrival date & time 10/21/11  1941   First MD Initiated Contact with Patient 10/21/11 2011      8:19 PM HPI Patient reports swelling of right posterior calf for 3 weeks. States she was seen here 3 days ago for same and had Dopplers done on leg. Dopplers were negative for blood clot. Reports persistent pain and swelling. States now she is also beginning to have some chest pressure and shortness of breath. Denies erythema of leg, mass, numbness, tingling, weakness. Reports chest pressure and shortness of breath is worse with laying flat. Denies known injury to your right leg.  Patient is a 41 y.o. female presenting with leg pain.  Leg Pain  Incident onset: 3 weeks ago. The injury mechanism is unknown. Pain location: right posterior calf. The pain is severe. The pain has been worsening since onset. Pertinent negatives include no numbness, no inability to bear weight, no loss of motion, no muscle weakness, no loss of sensation and no tingling. She reports no foreign bodies present. The symptoms are aggravated by palpation, activity and bearing weight. Treatments tried: vicodin. The treatment provided no relief.    Past Medical History  Diagnosis Date  . Diabetes mellitus   . Hypertension     History reviewed. No pertinent past surgical history.  No family history on file.  History  Substance Use Topics  . Smoking status: Never Smoker   . Smokeless tobacco: Not on file  . Alcohol Use: No    OB History    Grav Para Term Preterm Abortions TAB SAB Ect Mult Living                  Review of Systems  Constitutional: Negative for fever, chills, diaphoresis and fatigue.  HENT: Negative for neck pain.   Respiratory: Positive for shortness of breath. Negative for cough and wheezing.   Cardiovascular: Positive for chest pain. Negative for leg swelling.  Gastrointestinal: Negative for nausea, vomiting and abdominal pain.  Musculoskeletal: Negative for back  pain.       Calf pain and swelling  Neurological: Negative for dizziness, tingling, weakness, numbness and headaches.  All other systems reviewed and are negative.    Allergies  Review of patient's allergies indicates no known allergies.  Home Medications   Current Outpatient Rx  Name Route Sig Dispense Refill  . ALBUTEROL SULFATE HFA 108 (90 BASE) MCG/ACT IN AERS Inhalation Inhale 2 puffs into the lungs every 4 (four) hours as needed. For wheezing.    . ENALAPRIL MALEATE 10 MG PO TABS Oral Take 10 mg by mouth daily.    Marland Kitchen GLIPIZIDE ER 10 MG PO TB24 Oral Take 10 mg by mouth daily.    Marland Kitchen HYDROCODONE-ACETAMINOPHEN 5-325 MG PO TABS Oral Take 1 tablet by mouth every 4 (four) hours as needed. For pain.    Marland Kitchen METFORMIN HCL 500 MG PO TABS Oral Take 1,000 mg by mouth 2 (two) times daily with a meal.    . ONDANSETRON HCL 4 MG PO TABS Oral Take 4 mg by mouth every 6 (six) hours.    Marland Kitchen PREDNISONE 10 MG PO TABS Oral Take 2 tablets (20 mg total) by mouth daily. 8 tablet 0    BP 160/81  Pulse 73  Temp(Src) 97.9 F (36.6 C) (Oral)  Resp 20  SpO2 100%  LMP 10/19/2011  Physical Exam  Vitals reviewed. Constitutional: She is oriented to person, place, and time. Vital signs are normal. She  appears well-developed and well-nourished.  HENT:  Head: Normocephalic and atraumatic.  Eyes: Conjunctivae are normal. Pupils are equal, round, and reactive to light.  Neck: Normal range of motion. Neck supple.  Cardiovascular: Normal rate, regular rhythm and normal heart sounds.  Exam reveals no friction rub.   No murmur heard. Pulmonary/Chest: Effort normal and breath sounds normal. She has no wheezes. She has no rhonchi. She has no rales. She exhibits no tenderness.  Musculoskeletal: Normal range of motion.       Right lower leg: She exhibits tenderness. She exhibits no swelling (no pitting edema or edema noticible ), no edema, no deformity and no laceration.       Legs: Neurological: She is alert and  oriented to person, place, and time.  Skin: Skin is warm and dry. No rash noted. No erythema. No pallor.    ED Course  Procedures  Results for orders placed during the hospital encounter of 10/21/11  CBC      Component Value Range   WBC 7.3  4.0 - 10.5 (K/uL)   RBC 4.88  3.87 - 5.11 (MIL/uL)   Hemoglobin 13.5  12.0 - 15.0 (g/dL)   HCT 16.1  09.6 - 04.5 (%)   MCV 83.6  78.0 - 100.0 (fL)   MCH 27.7  26.0 - 34.0 (pg)   MCHC 33.1  30.0 - 36.0 (g/dL)   RDW 40.9  81.1 - 91.4 (%)   Platelets 194  150 - 400 (K/uL)  DIFFERENTIAL      Component Value Range   Neutrophils Relative 60  43 - 77 (%)   Neutro Abs 4.4  1.7 - 7.7 (K/uL)   Lymphocytes Relative 34  12 - 46 (%)   Lymphs Abs 2.5  0.7 - 4.0 (K/uL)   Monocytes Relative 5  3 - 12 (%)   Monocytes Absolute 0.3  0.1 - 1.0 (K/uL)   Eosinophils Relative 2  0 - 5 (%)   Eosinophils Absolute 0.1  0.0 - 0.7 (K/uL)   Basophils Relative 0  0 - 1 (%)   Basophils Absolute 0.0  0.0 - 0.1 (K/uL)  COMPREHENSIVE METABOLIC PANEL      Component Value Range   Sodium 136  135 - 145 (mEq/L)   Potassium 4.1  3.5 - 5.1 (mEq/L)   Chloride 102  96 - 112 (mEq/L)   CO2 24  19 - 32 (mEq/L)   Glucose, Bld 97  70 - 99 (mg/dL)   BUN 10  6 - 23 (mg/dL)   Creatinine, Ser 7.82  0.50 - 1.10 (mg/dL)   Calcium 9.4  8.4 - 95.6 (mg/dL)   Total Protein 7.3  6.0 - 8.3 (g/dL)   Albumin 3.8  3.5 - 5.2 (g/dL)   AST 13  0 - 37 (U/L)   ALT 14  0 - 35 (U/L)   Alkaline Phosphatase 69  39 - 117 (U/L)   Total Bilirubin 0.2 (*) 0.3 - 1.2 (mg/dL)   GFR calc non Af Amer 73 (*) >90 (mL/min)   GFR calc Af Amer 85 (*) >90 (mL/min)  URINALYSIS, ROUTINE W REFLEX MICROSCOPIC      Component Value Range   Color, Urine YELLOW  YELLOW    APPearance CLEAR  CLEAR    Specific Gravity, Urine 1.007  1.005 - 1.030    pH 6.5  5.0 - 8.0    Glucose, UA NEGATIVE  NEGATIVE (mg/dL)   Hgb urine dipstick LARGE (*) NEGATIVE    Bilirubin Urine NEGATIVE  NEGATIVE    Ketones, ur NEGATIVE  NEGATIVE  (mg/dL)   Protein, ur NEGATIVE  NEGATIVE (mg/dL)   Urobilinogen, UA 1.0  0.0 - 1.0 (mg/dL)   Nitrite NEGATIVE  NEGATIVE    Leukocytes, UA TRACE (*) NEGATIVE   POCT I-STAT TROPONIN I      Component Value Range   Troponin i, poc 0.00  0.00 - 0.08 (ng/mL)   Comment 3           POCT PREGNANCY, URINE      Component Value Range   Preg Test, Ur NEGATIVE  NEGATIVE   URINE MICROSCOPIC-ADD ON      Component Value Range   Squamous Epithelial / LPF FEW (*) RARE    WBC, UA 0-2  <3 (WBC/hpf)   RBC / HPF 3-6  <3 (RBC/hpf)   Bacteria, UA RARE  RARE    Dg Chest 2 View  10/21/2011  *RADIOLOGY REPORT*  Clinical Data: Chest pain and shortness of breath for 2 months.  CHEST - 2 VIEW  Comparison: Chest radiograph performed 07/26/2011  Findings: The lungs are well-aerated and clear.  There is no evidence of focal opacification, pleural effusion or pneumothorax.  The heart is borderline normal in size; the mediastinal contour is within normal limits.  No acute osseous abnormalities are seen.  IMPRESSION: No acute cardiopulmonary process seen.  Original Report Authenticated By: Tonia Ghent, M.D.   Dg Hip Complete Right  10/19/2011  *RADIOLOGY REPORT*  Clinical Data: Right-sided migratory hip pain  RIGHT HIP - COMPLETE 2+ VIEW  Comparison: None.  Findings:  No fracture or dislocation.  Mild degenerative changes of the right hip with joint space loss, subchondral sclerosis and osteophytosis. Minimal enthesopathic change/ prior avulsion injury at the greater trochanter.  Limited visualization of the pelvis is normal. Limited visualization of the bilateral SI joints and pubic symphysis is normal.  Suspect mild degenerative change within the contralateral left hip. Limited visualization of lower lumbar spine is normal.  Regional bowel gas pattern is normal.  IMPRESSION: 1.  Mild degenerative change of the right hip.  2.   Minimal enthesopathic change/prior avulsion injury at the greater trochanter.  Original Report  Authenticated By: Waynard Reeds, M.D.    Date: 10/21/2011  Rate: 74  Rhythm: normal sinus rhythm  QRS Axis: normal  Intervals: normal  ST/T Wave abnormalities: normal  Conduction Disutrbances: none   Old EKG Reviewed: No significant changes noted since 10/19/2011     MDM   Discussed normal labs with patient. Readdressed patient's chest pain. Patient states chest pain has been occurring persistently for several months without change. Low suspicion for acute coronary syndrome or pulmonary edema. Suspect patient has tendinitis as patient points to right lateral posterior knee overhead and fibula. Patient is currently pending Medicaid in we'll be following up with primary care physician seems she hadn't. Patient reports a significant history is standing all day because she works at [employer]. Advised anti-inflammatory medication in addition to pain medication. Will give patient 2 days off work and advised to rest elevate and use warm compresses on the lower chart many. Patient agrees with plan and is ready for discharge     Thomasene Lot, Cordelia Poche 10/21/11 2146  Thomasene Lot, PA-C 10/21/11 2229

## 2011-10-21 NOTE — ED Notes (Signed)
Patient transported to X-ray 

## 2011-10-22 NOTE — ED Provider Notes (Signed)
Medical screening examination/treatment/procedure(s) were performed by non-physician practitioner and as supervising physician I was immediately available for consultation/collaboration.  Halena Mohar M Trenia Tennyson, MD 10/22/11 2334 

## 2011-11-04 ENCOUNTER — Encounter (HOSPITAL_COMMUNITY): Payer: Self-pay | Admitting: Emergency Medicine

## 2011-11-04 ENCOUNTER — Emergency Department (HOSPITAL_COMMUNITY)
Admission: EM | Admit: 2011-11-04 | Discharge: 2011-11-04 | Disposition: A | Discharge status: Home / Self Care | Payer: Medicaid Other | Attending: Emergency Medicine | Admitting: Emergency Medicine

## 2011-11-04 DIAGNOSIS — E119 Type 2 diabetes mellitus without complications: Secondary | ICD-10-CM | POA: Insufficient documentation

## 2011-11-04 DIAGNOSIS — M79609 Pain in unspecified limb: Secondary | ICD-10-CM | POA: Insufficient documentation

## 2011-11-04 DIAGNOSIS — M79606 Pain in leg, unspecified: Secondary | ICD-10-CM

## 2011-11-04 DIAGNOSIS — I10 Essential (primary) hypertension: Secondary | ICD-10-CM | POA: Insufficient documentation

## 2011-11-04 DIAGNOSIS — Z79899 Other long term (current) drug therapy: Secondary | ICD-10-CM | POA: Insufficient documentation

## 2011-11-04 LAB — POCT I-STAT, CHEM 8
Creatinine, Ser: 0.9 mg/dL (ref 0.50–1.10)
HCT: 45 % (ref 36.0–46.0)
Hemoglobin: 15.3 g/dL — ABNORMAL HIGH (ref 12.0–15.0)
Potassium: 3.4 mEq/L — ABNORMAL LOW (ref 3.5–5.1)
Sodium: 139 mEq/L (ref 135–145)

## 2011-11-04 LAB — DIFFERENTIAL
Basophils Absolute: 0 10*3/uL (ref 0.0–0.1)
Basophils Relative: 0 % (ref 0–1)
Eosinophils Absolute: 0.1 10*3/uL (ref 0.0–0.7)
Lymphs Abs: 2.4 10*3/uL (ref 0.7–4.0)
Neutrophils Relative %: 67 % (ref 43–77)

## 2011-11-04 LAB — CBC
MCH: 27.3 pg (ref 26.0–34.0)
Platelets: 226 10*3/uL (ref 150–400)
RBC: 4.94 MIL/uL (ref 3.87–5.11)

## 2011-11-04 MED ORDER — OXYCODONE-ACETAMINOPHEN 5-325 MG PO TABS: 1.0000 | ORAL_TABLET | Freq: Four times a day (QID) | ORAL | Status: AC | PRN

## 2011-11-04 NOTE — ED Notes (Signed)
C/o pain to R leg that radiates up R side of back and into R shoulder x 3 weeks.  No known injury.  Pt states she had a x-ray before for same and believes pain is coming from her hip.

## 2011-11-04 NOTE — Discharge Instructions (Signed)
No definitive cause for your pain was identified tonight .  The test specifically designed to rule out blood clot is negative.  Please followup with your primary care physician as scheduled June 5

## 2011-11-04 NOTE — ED Provider Notes (Signed)
Medical screening examination/treatment/procedure(s) were performed by non-physician practitioner and as supervising physician I was immediately available for consultation/collaboration.   Glynn Octave, MD 11/04/11 [phone]

## 2011-11-04 NOTE — ED Provider Notes (Signed)
History     CSN: [id number]  Arrival date & time 11/04/11  2034   First MD Initiated Contact with Patient 11/04/11 2148      Chief Complaint  Patient presents with  . Leg Pain    (Consider location/radiation/quality/duration/timing/severity/associated sxs/prior treatment) HPI Comments: Patient has been having right leg pain that radiates from her calf up to her lower back persistently for quite some time.  She has been evaluated in the emergency department several times for this with negative findings including x-ray, blood count today.  I added a d-dimer to rule out a DVT.  This is negative.  She does have an appointment with her primary care physician on June 5  The history is provided by the patient.    Past Medical History  Diagnosis Date  . Diabetes mellitus   . Hypertension     History reviewed. No pertinent past surgical history.  No family history on file.  History  Substance Use Topics  . Smoking status: Never Smoker   . Smokeless tobacco: Not on file  . Alcohol Use: No    OB History    Grav Para Term Preterm Abortions TAB SAB Ect Mult Living                  Review of Systems  Constitutional: Negative for fever.  Musculoskeletal: Positive for myalgias. Negative for back pain and joint swelling.  Skin: Negative for wound.  Neurological: Negative for weakness and numbness.    Allergies  Review of patient's allergies indicates no known allergies.  Home Medications   Current Outpatient Rx  Name Route Sig Dispense Refill  . ALBUTEROL SULFATE HFA 108 (90 BASE) MCG/ACT IN AERS Inhalation Inhale 2 puffs into the lungs every 4 (four) hours as needed. For wheezing.    . ENALAPRIL MALEATE 10 MG PO TABS Oral Take 10 mg by mouth daily.    Marland Kitchen GLIPIZIDE ER 10 MG PO TB24 Oral Take 10 mg by mouth daily.    Marland Kitchen METFORMIN HCL 500 MG PO TABS Oral Take 1,000 mg by mouth 2 (two) times daily with a meal.    . NAPROXEN 500 MG PO TABS Oral Take 1 tablet (500 mg total) by  mouth 2 (two) times daily. 30 tablet 0  . PREDNISONE 10 MG PO TABS Oral Take 2 tablets (20 mg total) by mouth daily. 8 tablet 0  . OXYCODONE-ACETAMINOPHEN 5-325 MG PO TABS Oral Take 1-2 tablets by mouth every 6 (six) hours as needed for pain. 20 tablet 0    BP 160/100  Pulse 98  Temp(Src) 97.9 F (36.6 C) (Oral)  Resp 18  SpO2 97%  LMP 10/19/2011  Physical Exam  Constitutional: She appears well-developed and well-nourished.  HENT:  Head: Normocephalic.  Neck: Normal range of motion.  Cardiovascular: Normal rate.   Pulmonary/Chest: Effort normal and breath sounds normal. No respiratory distress. She has no wheezes. She exhibits no tenderness.  Abdominal: Soft.  Musculoskeletal: Normal range of motion. She exhibits tenderness. She exhibits no edema.       Legs:   ED Course  Procedures (including critical care time)  Labs Reviewed  POCT I-STAT, CHEM 8 - Abnormal; Notable for the following:    Potassium 3.4 (*)    Glucose, Bld 136 (*)    Hemoglobin 15.3 (*)    All other components within normal limits  D-DIMER, QUANTITATIVE  CBC  DIFFERENTIAL   No results found.   No diagnosis found.    MDM  Reviewed.  Films.  Labs, previous recent evaluations for the same pain, the only test, missing, is a d-dimer, will obtaint his to rule out DVT, although I do not think this is likely after examination.        Arman Filter, NP 11/04/11 1610  Arman Filter, NP 11/04/11 9604  Arman Filter, NP 11/04/11 2340

## 2011-11-04 NOTE — ED Notes (Signed)
Pt reports having left calf pain for the past month - with radiation to lower back and shoulders.  Pt states she feel like her heart is tightening up and has been having severe abdominal cramping which she associates with her leg pain.  Pt denies any injury to the site.

## 2011-11-05 ENCOUNTER — Other Ambulatory Visit (HOSPITAL_BASED_OUTPATIENT_CLINIC_OR_DEPARTMENT_OTHER): Payer: Self-pay | Admitting: Internal Medicine

## 2011-11-05 ENCOUNTER — Other Ambulatory Visit (HOSPITAL_BASED_OUTPATIENT_CLINIC_OR_DEPARTMENT_OTHER): Payer: Self-pay

## 2011-11-05 DIAGNOSIS — R52 Pain, unspecified: Secondary | ICD-10-CM

## 2011-11-06 ENCOUNTER — Other Ambulatory Visit (HOSPITAL_BASED_OUTPATIENT_CLINIC_OR_DEPARTMENT_OTHER): Payer: Self-pay

## 2011-11-06 ENCOUNTER — Ambulatory Visit (HOSPITAL_BASED_OUTPATIENT_CLINIC_OR_DEPARTMENT_OTHER)
Admission: RE | Admit: 2011-11-06 | Discharge: 2011-11-06 | Disposition: A | Discharge status: Home / Self Care | Payer: Medicaid Other | Source: Ambulatory Visit | Attending: Internal Medicine | Admitting: Internal Medicine

## 2011-11-06 DIAGNOSIS — M7989 Other specified soft tissue disorders: Secondary | ICD-10-CM | POA: Insufficient documentation

## 2011-11-06 DIAGNOSIS — R52 Pain, unspecified: Secondary | ICD-10-CM

## 2011-11-06 DIAGNOSIS — M79609 Pain in unspecified limb: Secondary | ICD-10-CM | POA: Insufficient documentation

## 2011-12-16 ENCOUNTER — Observation Stay (HOSPITAL_COMMUNITY)
Admission: EM | Admit: 2011-12-16 | Discharge: 2011-12-17 | Disposition: A | Discharge status: Home / Self Care | Payer: Medicaid Other | Source: Ambulatory Visit | Attending: Emergency Medicine | Admitting: Emergency Medicine

## 2011-12-16 ENCOUNTER — Encounter (HOSPITAL_COMMUNITY): Payer: Self-pay | Admitting: Emergency Medicine

## 2011-12-16 ENCOUNTER — Emergency Department (HOSPITAL_COMMUNITY): Payer: Medicaid Other

## 2011-12-16 DIAGNOSIS — R079 Chest pain, unspecified: Principal | ICD-10-CM | POA: Insufficient documentation

## 2011-12-16 DIAGNOSIS — R071 Chest pain on breathing: Secondary | ICD-10-CM

## 2011-12-16 LAB — BASIC METABOLIC PANEL
BUN: 8 mg/dL (ref 6–23)
Chloride: 97 mEq/L (ref 96–112)
GFR calc Af Amer: 89 mL/min — ABNORMAL LOW (ref >90)
Glucose, Bld: 181 mg/dL — ABNORMAL HIGH (ref 70–99)
Potassium: 3.9 mEq/L (ref 3.5–5.1)
Sodium: 133 mEq/L — ABNORMAL LOW (ref 135–145)

## 2011-12-16 LAB — CBC WITH DIFFERENTIAL/PLATELET
Hemoglobin: 13 g/dL (ref 12.0–15.0)
Lymphs Abs: 1.9 10*3/uL (ref 0.7–4.0)
Monocytes Relative: 5 % (ref 3–12)
Neutro Abs: 3.2 10*3/uL (ref 1.7–7.7)
Neutrophils Relative %: 58 % (ref 43–77)
RBC: 4.77 MIL/uL (ref 3.87–5.11)
WBC: 5.5 10*3/uL (ref 4.0–10.5)

## 2011-12-16 LAB — GLUCOSE, CAPILLARY: Glucose-Capillary: 101 mg/dL — ABNORMAL HIGH (ref 70–99)

## 2011-12-16 LAB — URINALYSIS, ROUTINE W REFLEX MICROSCOPIC
Bilirubin Urine: NEGATIVE
Hgb urine dipstick: NEGATIVE
Nitrite: NEGATIVE
Specific Gravity, Urine: 1.01 (ref 1.005–1.030)
pH: 5.5 (ref 5.0–8.0)

## 2011-12-16 LAB — POCT I-STAT TROPONIN I: Troponin i, poc: 0 ng/mL (ref 0.00–0.08)

## 2011-12-16 MED ORDER — ASPIRIN 81 MG PO CHEW
CHEWABLE_TABLET | ORAL | Status: AC
  Administered 2011-12-16: 324 mg
  Filled 2011-12-16: qty 4

## 2011-12-16 MED ORDER — MORPHINE SULFATE 4 MG/ML IJ SOLN
4.0000 mg | Freq: Once | INTRAMUSCULAR | Status: AC
  Administered 2011-12-16: 4 mg via INTRAVENOUS
  Filled 2011-12-16: qty 1
  Filled 2011-12-16: qty 2

## 2011-12-16 MED ORDER — ONDANSETRON HCL 4 MG/2ML IJ SOLN
4.0000 mg | Freq: Once | INTRAMUSCULAR | Status: AC
  Administered 2011-12-16: 4 mg via INTRAVENOUS
  Filled 2011-12-16: qty 2

## 2011-12-16 NOTE — ED Notes (Signed)
Pt reports chest pain with shortness of breath, dizziness and nausea onset today at 1715. Pt also reports checked her blood sugar at home and it was 356.

## 2011-12-16 NOTE — ED Provider Notes (Signed)
History     CSN: [id number]  Arrival date & time 12/16/11  1827   First MD Initiated Contact with Patient 12/16/11 2019      Chief Complaint  Patient presents with  . Chest Pain  . Hyperglycemia    (Consider location/radiation/quality/duration/timing/severity/associated sxs/prior treatment) Patient is a 41 y.o. female presenting with chest pain. The history is provided by the patient.  Chest Pain The chest pain began 3 - 5 hours ago. Chest pain occurs constantly. The chest pain is unchanged. The pain is associated with breathing. At its most intense, the pain is at 7/10. The quality of the pain is described as pleuritic and sharp. Primary symptoms include shortness of breath, nausea and dizziness. Pertinent negatives for primary symptoms include no fever, no cough, no wheezing, no palpitations, no abdominal pain and no vomiting.  Dizziness also occurs with nausea. Dizziness does not occur with vomiting or weakness.   Pertinent negatives for associated symptoms include no weakness.   Pt states she was sitting on the cough when developed left sided chest pain. States it was associated with shortness of breath, dizziness, nausea. Describes shortness of breath as "pain with breathing."  States also checked her blood sugar at home and it was over 300. No cardiac history. No prior cardiac workup. Has never seen cardiology. Denies current sob, nausea. But states chest pain is still present. No fever, chills, cough, abdominal pain.   Past Medical History  Diagnosis Date  . Diabetes mellitus   . Hypertension   . Myocardial infarction     Past Surgical History  Procedure Date  . Tubal ligation     History reviewed. No pertinent family history.  History  Substance Use Topics  . Smoking status: Never Smoker   . Smokeless tobacco: Not on file  . Alcohol Use: No    OB History    Grav Para Term Preterm Abortions TAB SAB Ect Mult Living                  Review of Systems    Constitutional: Negative for fever and chills.  HENT: Negative for neck pain.   Respiratory: Positive for shortness of breath. Negative for cough, chest tightness, wheezing and stridor.   Cardiovascular: Positive for chest pain. Negative for palpitations and leg swelling.  Gastrointestinal: Positive for nausea. Negative for vomiting, abdominal pain, diarrhea and constipation.  Genitourinary: Negative for dysuria and flank pain.  Musculoskeletal: Negative for myalgias and back pain.  Skin: Negative.   Neurological: Positive for dizziness. Negative for weakness, light-headedness and headaches.    Allergies  Review of patient's allergies indicates no known allergies.  Home Medications   Current Outpatient Rx  Name Route Sig Dispense Refill  . ALBUTEROL SULFATE HFA 108 (90 BASE) MCG/ACT IN AERS Inhalation Inhale 2 puffs into the lungs every 4 (four) hours as needed. For wheezing.    . ENALAPRIL MALEATE 10 MG PO TABS Oral Take 10 mg by mouth daily.    Marland Kitchen GLIPIZIDE ER 10 MG PO TB24 Oral Take 10 mg by mouth daily.    Marland Kitchen METFORMIN HCL 500 MG PO TABS Oral Take 1,000 mg by mouth 2 (two) times daily with a meal.    . NAPROXEN 500 MG PO TABS Oral Take 1 tablet (500 mg total) by mouth 2 (two) times daily. 30 tablet 0  . PREDNISONE 10 MG PO TABS Oral Take 2 tablets (20 mg total) by mouth daily. 8 tablet 0    BP 135/114  Pulse 87  Temp 98 F (36.7 C) (Oral)  Resp 25  SpO2 98%  LMP 12/12/2011  Physical Exam  Nursing note and vitals reviewed. Constitutional: She is oriented to person, place, and time. She appears well-developed and well-nourished. No distress.       obese  HENT:  Head: Normocephalic.  Eyes: Conjunctivae are normal.  Neck: Neck supple.  Cardiovascular: Normal rate, regular rhythm and normal heart sounds.   Pulmonary/Chest: Effort normal and breath sounds normal. No respiratory distress. She has no wheezes. She has no rales.  Abdominal: Soft. Bowel sounds are normal. She  exhibits no distension. There is no tenderness.  Musculoskeletal:       Non pitting, mild LE edema bilaterally  Neurological: She is alert and oriented to person, place, and time.  Skin: Skin is warm and dry.  Psychiatric: She has a normal mood and affect.    ED Course  Procedures (including critical care time)  Labs Reviewed  BASIC METABOLIC PANEL - Abnormal; Notable for the following:    Sodium 133 (*)     Glucose, Bld 181 (*)     GFR calc non Af Amer 77 (*)     GFR calc Af Amer 89 (*)     All other components within normal limits  GLUCOSE, CAPILLARY - Abnormal; Notable for the following:    Glucose-Capillary 101 (*)     All other components within normal limits  CBC WITH DIFFERENTIAL  URINALYSIS, ROUTINE W REFLEX MICROSCOPIC  POCT I-STAT TROPONIN I   Dg Chest 2 View  12/16/2011  *RADIOLOGY REPORT*  Clinical Data: Left chest pain, shortness of breath, cough.  CHEST - 2 VIEW  Comparison: 06/23/2011  Findings: Heart is mildly enlarged.  There is peribronchial thickening.  Lungs are clear.  No effusions or edema.  No acute bony abnormality.  IMPRESSION: Mild cardiomegaly.  Bronchitic changes.  Original Report Authenticated By: Cyndie Chime, M.D.    Date: 12/16/2011  Rate: 86  Rhythm: normal sinus rhythm  QRS Axis: normal  Intervals: normal  ST/T Wave abnormalities: normal  Conduction Disutrbances: none  Narrative Interpretation:   Old EKG Reviewed: No significant changes noted    Pt with atypical Chest pain. No prior work up. Risk factors: she is diabetic, htn, family hx of cardiac problems. TIMI 0. ECG normal. Pain reproducible with palpation and breathing. PERC negative. Discussed results with pt and Dr. Karma Ganja, will place on Chest pain protocol in CDU. Stress echo in AM.   Off note. Pt's history states hx of MI, however, she has never had cardiac cath or stenting according to her. States she may have been told "a long time ago" that she had a "mini heart attack" but  states she cannot remember who told he this and states that she has never seen a cardiologist before. Never had a stress test. So do not think pt actually had an MI.   1. Chest pain on breathing       MDM         Lottie Mussel, PA 12/21/11 1605

## 2011-12-16 NOTE — ED Notes (Addendum)
Chest pain started about 1715 pm tonight while watching TV, sharp pain and some tightness with some left hand feeling heavy . Also noticing  some right side weakness.  Stated some hx of CP in past but" it has been long time ago" stated some dizzness  And some SOB

## 2011-12-17 ENCOUNTER — Encounter (HOSPITAL_COMMUNITY): Payer: Self-pay | Admitting: Physician Assistant

## 2011-12-17 DIAGNOSIS — R071 Chest pain on breathing: Secondary | ICD-10-CM

## 2011-12-17 LAB — CARDIAC PANEL(CRET KIN+CKTOT+MB+TROPI)
CK, MB: 1.9 ng/mL (ref 0.3–4.0)
Relative Index: 1.2 (ref 0.0–2.5)
Total CK: 153 U/L (ref 7–177)
Troponin I: <0.3 ng/mL (ref <0.30)

## 2011-12-17 LAB — POCT I-STAT TROPONIN I

## 2011-12-17 LAB — GLUCOSE, CAPILLARY: Glucose-Capillary: 124 mg/dL — ABNORMAL HIGH (ref 70–99)

## 2011-12-17 MED ORDER — IBUPROFEN 800 MG PO TABS
800.0000 mg | ORAL_TABLET | Freq: Once | ORAL | Status: AC
  Administered 2011-12-17: 800 mg via ORAL
  Filled 2011-12-17: qty 1

## 2011-12-17 MED ORDER — MORPHINE SULFATE 4 MG/ML IJ SOLN
4.0000 mg | Freq: Once | INTRAMUSCULAR | Status: AC
  Administered 2011-12-17: 4 mg via INTRAVENOUS
  Filled 2011-12-17: qty 1

## 2011-12-17 MED ORDER — IBUPROFEN 800 MG PO TABS: 800.0000 mg | ORAL_TABLET | Freq: Three times a day (TID) | ORAL | Status: AC

## 2011-12-17 NOTE — Discharge Instructions (Signed)
Read instructions below for reasons to return to the Emergency Department. It is recommended that your follow up with your Primary Care Doctor in regards to today's visit. If you do not have a doctor, use the resource guide listed below to help you find one. Take Ibuprofen as needed for chest pain.    Chest Pain (Nonspecific)  HOME CARE INSTRUCTIONS  For the next few days, avoid physical activities that bring on chest pain. Continue physical activities as directed.  Do not smoke cigarettes or drink alcohol until your symptoms are gone.  Only take over-the-counter or prescription medicine for pain, discomfort, or fever as directed by your caregiver.  Follow your caregiver's suggestions for further testing if your chest pain does not go away.  Keep any follow-up appointments you made. If you do not go to an appointment, you could develop lasting (chronic) problems with pain. If there is any problem keeping an appointment, you must call to reschedule.  SEEK MEDICAL CARE IF:  You think you are having problems from the medicine you are taking. Read your medicine instructions carefully.  Your chest pain does not go away, even after treatment.  You develop a rash with blisters on your chest.  SEEK IMMEDIATE MEDICAL CARE IF:  You have increased chest pain or pain that spreads to your arm, neck, jaw, back, or belly (abdomen).  You develop shortness of breath, an increasing cough, or you are coughing up blood.  You have severe back or abdominal pain, feel sick to your stomach (nauseous) or throw up (vomit).  You develop severe weakness, fainting, or chills.  You have an oral temperature above 102 F (38.9 C), not controlled by medicine.   THIS IS AN EMERGENCY. Do not wait to see if the pain will go away. Get medical help at once. Call your local emergency services (911 in [place] Do not drive yourself to the hospital.   RESOURCE GUIDE  Dental Problems  Patients with Medicaid: [facility] [phone] [address]                                           [phone] [address] Phone:  [phone]                                                  Phone:  [phone]  If unable to pay or uninsured, contact:  [facility] or [facility]. to become qualified for the adult dental clinic.  Chronic Pain Problems Contact [facility]  [phone] Patients need to be referred by their primary care doctor.  Insufficient Money for Medicine Contact [employer] Way:  call "211" or [facility] 9056746559.  No Primary Care Doctor Call [facility]  [phone] Other agencies that provide inexpensive medical care    [facility]  [phone]    [facility]  [phone]    [facility]  [phone]    [facility]  [phone]    [facility]  [phone]    [facility]  [phone]  Psychological [facility] [place]  [facility]  [phone] [facility]  [phone] [facility] Mental [facility]   [phone] (emergency services [phone])  Substance Abuse Resources [facility]  727-833-9794 [facility] [phone] [facility] 647-089-4190 Floydene Flock [phone] Residential & Outpatient Substance Abuse Program  [phone]  Abuse/Neglect [facility] Child Abuse Hotline (604) 225-3077 [facility] Child Abuse Hotline 509-326-6170 (After Hours)  Emergency [facility]ter [facility] [phone]  [facility] [place] [facility] [place] [phone] [facility] 5487160403  MRSA Hotline #:   478-118-6520    [facility] Resources  [facility]     [employer]                          [facility] Dept. [address] [place]                       [address]      [address]  Blondell Reveal Phone:  [phone]                                   Phone:  [phone]                 Phone:  [phone]  [facility] Mental Health Phone:  [phone]  [facility] Child Abuse Hotline (405) [phone] (After Hours)

## 2011-12-17 NOTE — ED Notes (Signed)
PATIENT STATES THE PAIN SHE IS AND HAS BEEN HAVING MAKES HER CHEST FEEL "DRAWN UP" AND MAKES HER LEFT ARM "SORE TO TRY TO MOVE OR LIFT" .DENIES INJURY. DENIES SOB; STATES THIS PAIN IS A DAILY OCCURRENCE.

## 2011-12-17 NOTE — Progress Notes (Signed)
Observation review is complete. 

## 2011-12-17 NOTE — ED Notes (Signed)
Called lab to check on 90 minute delay in lab results. Lab reports they do not have the blood sample . According to chart documentation sample was drawn at 1145 am

## 2011-12-17 NOTE — ED Provider Notes (Signed)
9:10 AM Pt was getting ready to have stress test but while getting ready to get onto treadmill states she still has CP. Due to this she was excluded form the CPP and had to be sent back to CDU. Will consult cards for close follow-up. Upon further review of charts, it is over the patient has had similar chest pains for several months. I personally saw the patient approximately 2 months ago patient describes similar pains in the past reports symptoms have not changed.   9:36 AM I have spoken with Edsel Petrin requesting a Cards Consult. States she will send MD down. Patient reports still having chest pain. With history of possible "mini heart attack" I felt that a consult was more appropriate. However I do feel the patient's chest pain is low risk has been ongoing for several months and is likely noncardiac in nature. Patient is also perc negative , has had normal bilateral Dopplers in the past and neg D-dimers. Feel it is unlikely patient is having pulmonary embolism or acute coronary syndrome.   10:10 AM I have Spoken with Rhonda  And Dr. Patty Sermons from [place] [facility]. They recommend one more cardiac marker and if normal patient may be d/ced home with PCP follow-up    Thomasene Lot, PA-C 12/18/11 1007

## 2011-12-17 NOTE — ED Provider Notes (Signed)
  Assumed care of patient in the CDU from [place], [place].  She reports that the patient is waiting on second set of cardiac markers.  The plan is for patient to be discharged home if second set of markers are negative.  Patient has been evaluated by [facility].    3:30 PM Cardiac enzymes negative.  Nurse has notified [facility] of results.  Plan is to discharge patient home with Cardiology follow up.  [place] PA Trish has scheduled patient for outpatient appointment.    Pascal Lux Humble, PA-C 12/18/11 0002  Basia Mcginty Anne Shutter, PA-C 12/18/11 0002

## 2011-12-17 NOTE — Consult Note (Signed)
CARDIOLOGY CONSULT NOTE   Patient ID: Diana Henderson MRN: [id number] DOB/AGE: 1971/01/12 41 y.o.  Admit date: 12/16/2011  Primary Physician  Dr Julio Sicks Primary Cardiologist   None  Reason for Consultation   Chest pain  Diana Henderson is a 41 y.o. female with no history of CAD. Pt had onset of SSCP that radiates to the left approximately 5:30 pm yesterday. She describes the pain as sharp and squeezing. It was a 10/10 when she took a deep breath. She drank water but took no meds. She got a niece to take her to the ER. In the ER, she received the meds listed below with almost no relief. She now c/o 7/10 pain with mild inspiration. The pain is still 10/10 with deep inspiration. She does not appear to be acutely uncomfortable. She was slightly diaphoretic but no nausea with the pain. She has been having this pain regularly, it was not new, but this was the worst episode.    Past Medical History  Diagnosis Date  . Diabetes mellitus   . Hypertension   . Myocardial infarction - possible - small      Past Surgical History  Procedure Date  . Tubal ligation    History   Social History  . Marital Status: Single    Spouse Name: N/A    Number of Children: N/A  . Years of Education: N/A   Occupational History  . CASHIER at [employer]    Social History Main Topics  . Smoking status: Never Smoker   . Smokeless tobacco: Not on file  . Alcohol Use: No  . Drug Use: No  . Sexually Active:    Other Topics Concern  . Not on file   Social History Narrative   Lives with 39 year-old daughter, works at [employer], does not exercise but is on her feet at work.   Family Status  Relation Status Death Age  . Mother Deceased 37s    ESRD on HD  . Father Deceased 30s    s/p CABG  . Sister Alive     No CAD  . Sister Alive     No CAD  . Brother Alive     No CAD    No Known Allergies  I have reviewed the patient's current medications   . aspirin      . morphine  4 mg  Intravenous Once  . morphine  4 mg Intravenous Once  . ondansetron  4 mg Intravenous Once    Medication Sig  albuterol (PROVENTIL HFA;VENTOLIN HFA) 108 (90 BASE) MCG/ACT inhaler Inhale 2 puffs into the lungs every 4 (four) hours as needed. For wheezing.  enalapril (VASOTEC) 10 MG tablet Take 10 mg by mouth daily.  glipiZIDE (GLUCOTROL XL) 10 MG 24 hr tablet Take 10 mg by mouth daily.  metFORMIN (GLUCOPHAGE) 500 MG tablet Take 1,000 mg by mouth 2 (two) times daily with a meal.  naproxen (NAPROSYN) 500 MG tablet Take 1 tablet (500 mg total) by mouth 2  times daily.  predniSONE (DELTASONE) 10 MG tablet Take 2 tablets (20 mg total)  daily.     ROS: She feels she eats a diabetic diet but admits she likes grits, hash browns and toast at breakfast and does not know about carbohydrate counting. She has not had illnesses, fevers or chills. No GI problems reported. Chronic MS aches/pains. Full 14 point review of systems complete and found to be negative unless listed above.  Physical Exam: Blood pressure 154/94, pulse 68, temperature  98.3 F (36.8 C), temperature source Oral, resp. rate 18, weight 288 lb (130.636 kg), last menstrual period 12/12/2011, SpO2 100.00%.  General: Well developed, well nourished, female in no acute distress Head: Eyes PERRLA, No xanthomas.   Normocephalic and atraumatic, oropharynx without edema or exudate. Dentition good Lungs: bilaterally clear Heart: HRRR S1 S2, no rub/gallop, no significant murmur. pulses are 2+ all 4 extrem.   Neck: No carotid bruits. No lymphadenopathy.  JVD not elevated. Abdomen: Bowel sounds present, abdomen soft and tender both upper quadrants without guarding. No masses or hernias noted. Msk:  No spine or cva tenderness. No weakness, no joint deformities or effusions. Extremities: No clubbing or cyanosis. No edema.  Neuro: Alert and oriented X 3. No focal deficits noted. Psych:  Good affect, responds appropriately Skin: No rashes or lesions  noted.  Labs:  Lab Results  Component Value Date   WBC 5.5 12/16/2011   HGB 13.0 12/16/2011   HCT 39.4 12/16/2011   MCV 82.6 12/16/2011   PLT 186 12/16/2011     Lab 12/16/11 1839  NA 133*  K 3.9  CL 97  CO2 25  BUN 8  CREATININE 0.90  CALCIUM 9.1  PROT --  BILITOT --  ALKPHOS --  ALT --  AST --  GLUCOSE 181*    Basename 12/17/11 0214 12/17/11 0001  TROPIPOC 0.00 0.05    ECG:  16-Dec-2011 18:32:44  Normal sinus rhythm Normal ECG No significant change since last tracing Vent. rate 86 BPM PR interval 144 ms QRS duration 76 ms QT/QTc 396/473 ms P-R-T axes 57 26 65  Radiology:  Dg Chest 2 View 12/16/2011  *RADIOLOGY REPORT*  Clinical Data: Left chest pain, shortness of breath, cough.  CHEST - 2 VIEW  Comparison: 06/23/2011  Findings: Heart is mildly enlarged.  There is peribronchial thickening.  Lungs are clear.  No effusions or edema.  No acute bony abnormality.  IMPRESSION: Mild cardiomegaly.  Bronchitic changes.  Original Report Authenticated By: Cyndie Chime, M.D.    ASSESSMENT AND PLAN:   The patient was seen today by Dr Patty Sermons, the patient evaluated and the data reviewed.  Principal Problem:  *Chest pain on breathing - Pt still uncomfortable, need 1 full set of enzymes (now >12 hours after onset of pain) to rule out. If does so, will give Toradol to try to get more comfortable. If this works, consider d/c with Motrin and f/u in ofc with OP MV.    Signed: Theodore Demark 12/17/2011, 10:24 AM Co-Sign MD Patient seen in CDU.  EKG from yesterday and today show no ischemic changes. I agree with physical exam as noted above.  In addition, the patient is tender to touch along upper left sternal border. Cardiac exam is normal. My impression is that this is musculoskeletal chest wall pain.  No EKG or physical findings for pericarditis. Agree with plans for trial of NSAIDs and subsequent outpatient Myoview providing next set of enzymes is okay.

## 2011-12-17 NOTE — ED Notes (Signed)
Carb Mod diet ordered spoke with Eber Jones

## 2011-12-18 NOTE — ED Provider Notes (Signed)
Medical screening examination/treatment/procedure(s) were performed by non-physician practitioner and as supervising physician I was immediately available for consultation/collaboration.  Kynslie Ringle R. Brittyn Salaz, MD 12/18/11 0030 

## 2011-12-18 NOTE — ED Provider Notes (Signed)
Medical screening examination/treatment/procedure(s) were performed by non-physician practitioner and as supervising physician I was immediately available for consultation/collaboration.   Dione Booze, MD 12/18/11 [phone]

## 2011-12-21 NOTE — ED Provider Notes (Signed)
Medical screening examination/treatment/procedure(s) were performed by non-physician practitioner and as supervising physician I was immediately available for consultation/collaboration.  Ethelda Chick, MD 12/21/11 [phone]

## 2011-12-31 ENCOUNTER — Encounter: Payer: Self-pay | Admitting: Nurse Practitioner

## 2011-12-31 ENCOUNTER — Encounter: Payer: Self-pay | Admitting: *Deleted

## 2012-01-10 HISTORY — PX: OTHER SURGICAL HISTORY: SHX169

## 2012-02-21 ENCOUNTER — Emergency Department (HOSPITAL_COMMUNITY): Payer: Medicaid Other

## 2012-02-21 ENCOUNTER — Encounter (HOSPITAL_COMMUNITY): Payer: Self-pay | Admitting: *Deleted

## 2012-02-21 ENCOUNTER — Emergency Department (EMERGENCY_DEPARTMENT_HOSPITAL)
Admission: EM | Admit: 2012-02-21 | Discharge: 2012-02-22 | Disposition: A | Discharge status: Home / Self Care | Payer: Medicaid Other | Source: Home / Self Care | Attending: Emergency Medicine | Admitting: Emergency Medicine

## 2012-02-21 DIAGNOSIS — F061 Catatonic disorder due to known physiological condition: Secondary | ICD-10-CM

## 2012-02-21 DIAGNOSIS — R29818 Other symptoms and signs involving the nervous system: Secondary | ICD-10-CM | POA: Insufficient documentation

## 2012-02-21 DIAGNOSIS — I252 Old myocardial infarction: Secondary | ICD-10-CM | POA: Insufficient documentation

## 2012-02-21 DIAGNOSIS — E119 Type 2 diabetes mellitus without complications: Secondary | ICD-10-CM | POA: Insufficient documentation

## 2012-02-21 DIAGNOSIS — R4182 Altered mental status, unspecified: Secondary | ICD-10-CM

## 2012-02-21 DIAGNOSIS — I10 Essential (primary) hypertension: Secondary | ICD-10-CM | POA: Insufficient documentation

## 2012-02-21 DIAGNOSIS — Z79899 Other long term (current) drug therapy: Secondary | ICD-10-CM | POA: Insufficient documentation

## 2012-02-21 LAB — CBC WITH DIFFERENTIAL/PLATELET
Basophils Relative: 0 % (ref 0–1)
Eosinophils Absolute: 0.1 10*3/uL (ref 0.0–0.7)
Eosinophils Relative: 1 % (ref 0–5)
MCH: 26.7 pg (ref 26.0–34.0)
MCHC: 33.2 g/dL (ref 30.0–36.0)
Monocytes Relative: 5 % (ref 3–12)
Neutrophils Relative %: 68 % (ref 43–77)
Platelets: 209 10*3/uL (ref 150–400)

## 2012-02-21 LAB — GLUCOSE, CAPILLARY: Glucose-Capillary: 141 mg/dL — ABNORMAL HIGH (ref 70–99)

## 2012-02-21 LAB — COMPREHENSIVE METABOLIC PANEL
AST: 18 U/L (ref 0–37)
Alkaline Phosphatase: 78 U/L (ref 39–117)
CO2: 23 mEq/L (ref 19–32)
Chloride: 100 mEq/L (ref 96–112)
Creatinine, Ser: 0.8 mg/dL (ref 0.50–1.10)
GFR calc non Af Amer: 89 mL/min — ABNORMAL LOW (ref >90)
Potassium: 4.3 mEq/L (ref 3.5–5.1)
Total Bilirubin: 0.2 mg/dL — ABNORMAL LOW (ref 0.3–1.2)

## 2012-02-21 LAB — RAPID URINE DRUG SCREEN, HOSP PERFORMED
Barbiturates: NOT DETECTED
Cocaine: NOT DETECTED
Opiates: NOT DETECTED

## 2012-02-21 MED ORDER — ALUM & MAG HYDROXIDE-SIMETH 200-200-20 MG/5ML PO SUSP
30.0000 mL | ORAL | Status: DC | PRN
Start: 2012-02-21 — End: 2012-02-22

## 2012-02-21 MED ORDER — IBUPROFEN 200 MG PO TABS: 600.0000 mg | ORAL_TABLET | Freq: Three times a day (TID) | ORAL | Status: DC | PRN

## 2012-02-21 MED ORDER — NABUMETONE 500 MG PO TABS
500.0000 mg | ORAL_TABLET | Freq: Two times a day (BID) | ORAL | Status: DC
  Administered 2012-02-21 – 2012-02-22 (×2): 500 mg via ORAL
  Filled 2012-02-21 (×4): qty 1

## 2012-02-21 MED ORDER — SODIUM CHLORIDE 0.9 % IV BOLUS (SEPSIS)
1000.0000 mL | Freq: Once | INTRAVENOUS | Status: AC
  Administered 2012-02-21: 1000 mL via INTRAVENOUS

## 2012-02-21 MED ORDER — DULOXETINE HCL 30 MG PO CPEP
30.0000 mg | ORAL_CAPSULE | Freq: Every day | ORAL | Status: DC
  Administered 2012-02-21 – 2012-02-22 (×2): 30 mg via ORAL
  Filled 2012-02-21 (×2): qty 1

## 2012-02-21 MED ORDER — METFORMIN HCL 500 MG PO TABS
500.0000 mg | ORAL_TABLET | Freq: Two times a day (BID) | ORAL | Status: DC
  Administered 2012-02-21 – 2012-02-22 (×3): 500 mg via ORAL
  Filled 2012-02-21 (×5): qty 1

## 2012-02-21 MED ORDER — GABAPENTIN 300 MG PO CAPS
300.0000 mg | ORAL_CAPSULE | Freq: Three times a day (TID) | ORAL | Status: DC
  Administered 2012-02-21 – 2012-02-22 (×4): 300 mg via ORAL
  Filled 2012-02-21 (×6): qty 1

## 2012-02-21 MED ORDER — LORAZEPAM 1 MG PO TABS: 1.0000 mg | ORAL_TABLET | Freq: Three times a day (TID) | ORAL | Status: DC | PRN

## 2012-02-21 MED ORDER — ONDANSETRON HCL 4 MG PO TABS: 4.0000 mg | ORAL_TABLET | Freq: Three times a day (TID) | ORAL | Status: DC | PRN

## 2012-02-21 MED ORDER — ONDANSETRON HCL 4 MG/2ML IJ SOLN
4.0000 mg | Freq: Once | INTRAMUSCULAR | Status: AC
  Administered 2012-02-21: 4 mg via INTRAVENOUS
  Filled 2012-02-21: qty 2

## 2012-02-21 MED ORDER — ALBUTEROL SULFATE HFA 108 (90 BASE) MCG/ACT IN AERS: 2.0000 | INHALATION_SPRAY | RESPIRATORY_TRACT | Status: DC | PRN

## 2012-02-21 MED ORDER — LORAZEPAM 2 MG/ML IJ SOLN
0.5000 mg | Freq: Once | INTRAMUSCULAR | Status: AC
  Administered 2012-02-21: 0.5 mg via INTRAVENOUS
  Filled 2012-02-21: qty 1

## 2012-02-21 MED ORDER — ACETAMINOPHEN 325 MG PO TABS: 650.0000 mg | ORAL_TABLET | ORAL | Status: DC | PRN

## 2012-02-21 MED ORDER — AMLODIPINE BESYLATE 10 MG PO TABS
10.0000 mg | ORAL_TABLET | Freq: Every day | ORAL | Status: DC
  Administered 2012-02-21 – 2012-02-22 (×2): 10 mg via ORAL
  Filled 2012-02-21 (×2): qty 1

## 2012-02-21 MED ORDER — GLIPIZIDE ER 10 MG PO TB24
10.0000 mg | ORAL_TABLET | Freq: Every day | ORAL | Status: DC
  Administered 2012-02-22: 10 mg via ORAL
  Filled 2012-02-21 (×2): qty 1

## 2012-02-21 NOTE — ED Notes (Signed)
Spoke with pt's sister and stated she was coming to ED to pick up pt's daughter. Spoke with charge nurse concerning this and stated ok for pt's daughter to go with pt's sister. Awaiting her arrival. Pt and pt's daughter informed of this.

## 2012-02-21 NOTE — ED Provider Notes (Signed)
History     CSN: [id number]  Arrival date & time 02/21/12  0247   First MD Initiated Contact with Patient 02/21/12 0255      Chief Complaint  Patient presents with  . Altered Mental Status    (Consider location/radiation/quality/duration/timing/severity/associated sxs/prior treatment) HPI 41 yo female presents to the ER via EMS from local gas station with report of altered mental status. Patient is reported to be catatonic. Patient will nod yes or no and follow simple commands, but will do nothing else. Patient is reported to have gone out with friends last night. Friends report she was in her normal state of physical and mental health, until midway through the evening, one of her friends heard her throwing up in the club bathroom. They report she only had one beer earlier in the evening.  She was not hanging out with anyone else in the club.  They do not feel that anyone "slipped her anything" Patient has history of diabetes, they were concerned that she would have low sugar, they gave her a hard candy and then brought her to the gas station to get her some food. On the way to the gas station, patient became less responsive and began staring off in space. Her friends report she's been under a lot of stress recently due to to losing her job and having foot surgery. She and her daughter had a fight yesterday. Patient has made passive suicide statements. She has a history of depression. No other history available from friends. Patient not answering any questions.  Past Medical History  Diagnosis Date  . Diabetes mellitus   . Hypertension   . Myocardial infarction     Past Surgical History  Procedure Date  . Tubal ligation     History reviewed. No pertinent family history.  History  Substance Use Topics  . Smoking status: Never Smoker   . Smokeless tobacco: Not on file  . Alcohol Use: No    OB History    Grav Para Term Preterm Abortions TAB SAB Ect Mult Living                   Review of Systems  Unable to perform ROS: Mental status change    Allergies  Review of patient's allergies indicates no known allergies.  Home Medications   Current Outpatient Rx  Name Route Sig Dispense Refill  . ALBUTEROL SULFATE HFA 108 (90 BASE) MCG/ACT IN AERS Inhalation Inhale 2 puffs into the lungs every 4 (four) hours as needed. For wheezing.    Marland Kitchen AMLODIPINE BESYLATE 10 MG PO TABS Oral Take 10 mg by mouth daily.    . DULOXETINE HCL 30 MG PO CPEP Oral Take 30 mg by mouth daily.    Marland Kitchen GABAPENTIN 300 MG PO CAPS Oral Take 300 mg by mouth 3 (three) times daily.    Marland Kitchen GLIPIZIDE ER 10 MG PO TB24 Oral Take 10 mg by mouth daily.    Marland Kitchen METFORMIN HCL 500 MG PO TABS Oral Take 500 mg by mouth 2 (two) times daily with a meal.     . NABUMETONE 500 MG PO TABS Oral Take 500 mg by mouth 2 (two) times daily.      BP 148/92  Pulse 107  Temp 98.5 F (36.9 C) (Oral)  Resp 22  SpO2 98%  Physical Exam  Nursing note and vitals reviewed. Constitutional:       Morbidly obese   HENT:  Head: Normocephalic and atraumatic.  Right Ear: External  ear normal.  Left Ear: External ear normal.  Nose: Nose normal.  Mouth/Throat: Oropharynx is clear and moist.  Eyes: Conjunctivae and EOM are normal. Pupils are equal, round, and reactive to light. Right eye exhibits no discharge. Left eye exhibits no discharge.  Neck: Normal range of motion. Neck supple. No JVD present. No tracheal deviation present. No thyromegaly present.  Cardiovascular: Normal rate, regular rhythm, normal heart sounds and intact distal pulses.  Exam reveals no gallop and no friction rub.   No murmur heard. Pulmonary/Chest: Effort normal and breath sounds normal. No stridor. No respiratory distress. She has no wheezes. She has no rales. She exhibits no tenderness.  Abdominal: Soft. Bowel sounds are normal. She exhibits no distension and no mass. There is no tenderness. There is no rebound and no guarding.  Musculoskeletal: She  exhibits no edema and no tenderness.  Lymphadenopathy:    She has no cervical adenopathy.  Neurological: She is alert. She displays normal reflexes. She exhibits normal muscle tone. Coordination normal.  Skin: Skin is warm and dry. No rash noted. No erythema. No pallor.  Psychiatric:       Pt does not answer questions, limited response to commands, waxy catatonia    ED Course  Procedures (including critical care time)  Labs Reviewed  ETHANOL - Abnormal; Notable for the following:    Alcohol, Ethyl (B) 78 (*)     All other components within normal limits  COMPREHENSIVE METABOLIC PANEL - Abnormal; Notable for the following:    Glucose, Bld 257 (*)     Total Bilirubin 0.2 (*)     GFR calc non Af Amer 89 (*)     All other components within normal limits  GLUCOSE, CAPILLARY - Abnormal; Notable for the following:    Glucose-Capillary 206 (*)     All other components within normal limits  SALICYLATE LEVEL - Abnormal; Notable for the following:    Salicylate Lvl <2.0 (*)     All other components within normal limits  CBC WITH DIFFERENTIAL  URINE RAPID DRUG SCREEN (HOSP PERFORMED)  ACETAMINOPHEN LEVEL   Ct Head Wo Contrast  02/21/2012  *RADIOLOGY REPORT*  Clinical Data: Altered mental status, history hypertension, diabetes, hyperglycemia  CT HEAD WITHOUT CONTRAST  Technique:  Contiguous axial images were obtained from the base of the skull through the vertex without contrast.  Comparison: 11/01/2010  Findings: Scattered motion artifacts for which repeat imaging was performed. Normal ventricular morphology. No midline shift or mass effect. Grossly normal appearance of brain parenchyma. No intracranial hemorrhage, mass lesion or evidence of acute infarction. No extra-axial fluid collections. Visualized paranasal sinuses and mastoid air cells clear. No acute calvarial abnormalities.  IMPRESSION: No definite acute intracranial abnormalities.   Original Report Authenticated By: Lollie Marrow, M.D.      Date: 02/21/2012  Rate: 104  Rhythm: sinus tachycardia  QRS Axis: normal  Intervals: normal  ST/T Wave abnormalities: normal  Conduction Disutrbances:none  Narrative Interpretation: tachycardia new  Old EKG Reviewed: changes noted    1. Altered mental state   2. 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 No medical cause found. No improvement in symptoms with observation. Have placed holding orders and will have ACT team member evaluate and start process for admission.        Olivia Mackie, MD 02/21/12 [phone]

## 2012-02-21 NOTE — ED Provider Notes (Signed)
[place], ACT will put pt on list to be seen.  Ward Givens, MD 02/21/12 [phone]

## 2012-02-21 NOTE — ED Notes (Signed)
Per EMS: [place]. Friends report unusual behavior. Pt is non-verbal, but able to write name and birthday down. Friends on scene report no ETOH, pt smells of ETOH. Pt will nod head to yes or no questions.

## 2012-02-21 NOTE — BH Assessment (Signed)
Assessment Note   Shawnte Winton is an 41 y.o. female.  Patient is a 41 year old African American female that has a history of depression.  Patient only nodded during the assessment. Patient's daughter reports that she started to become catatonic yesterday.  Patient has a BAL of 78 and her drug screen was negative. Patient stared blankly when I asked her if she wanted to hurt herself or others.  Patient was not able to deny that she did not want to hurt herself.  Patient was not able to verbally contract for safety. Patient and her daughter did not report any past psychiatric hospitalizations. Patient's daughter reports that her mother takes medication for depression. Patient's daughter reports that he she has been very depressed since losing her job and having foot surgery. Patient's daughter answered most of the questions.  Patient was only able to nod her head.  Patient nooded her head, "no" about a past history of substance abuse. Documentation in the file reports that she had a CT scan that did not have any irregularities. Her daughter reports that the patient does not see or hear things that is not there.    Axis I: Major Depression, single episode Axis II: Deferred Axis III:  Past Medical History  Diagnosis Date  . Diabetes mellitus   . Hypertension   . Myocardial infarction    Axis IV: economic problems, educational problems, occupational problems, other psychosocial or environmental problems, problems related to social environment, problems with access to health care services and problems with primary support group Axis V: 11-20 some danger of hurting self or others possible OR occasionally fails to maintain minimal personal hygiene OR gross impairment in communication  Past Medical History:  Past Medical History  Diagnosis Date  . Diabetes mellitus   . Hypertension   . Myocardial infarction     Past Surgical History  Procedure Date  . Tubal ligation     Family History:  History reviewed. No pertinent family history.  Social History:  reports that she has never smoked. She does not have any smokeless tobacco history on file. She reports that she does not drink alcohol or use illicit drugs.  Additional Social History:     CIWA: CIWA-Ar BP: 147/84 mmHg Pulse Rate: 100  COWS:    Allergies: No Known Allergies  Home Medications:  (Not in a hospital admission)  OB/GYN Status:  No LMP recorded. Patient is not currently having periods (Reason: Other).  General Assessment Data Location of Assessment: [facility] ACT Assessment: Yes Living Arrangements: Children Can pt return to current living arrangement?: Yes Admission Status: Voluntary Is patient capable of signing voluntary admission?: Yes Transfer from: Acute Hospital Referral Source: Other  Education Status Is patient currently in school?: Yes Name of school: [employer]  Risk to self Suicidal Ideation: Yes-Currently Present Suicidal Intent: No-Not Currently/Within Last 6 Months Is patient at risk for suicide?: Yes Suicidal Plan?: No Access to Means: No What has been your use of drugs/alcohol within the last 12 months?:  (none reported) Previous Attempts/Gestures: No How many times?: 0  Other Self Harm Risks: na Triggers for Past Attempts: None known Intentional Self Injurious Behavior: None Family Suicide History: No Recent stressful life event(s): Job Loss Persecutory voices/beliefs?: No Depression: Yes Depression Symptoms: Despondent;Tearfulness;Isolating;Fatigue;Guilt;Loss of interest in usual pleasures;Feeling worthless/self pity Substance abuse history and/or treatment for substance abuse?: No Suicide prevention information given to non-admitted patients: Yes  Risk to Others Homicidal Ideation: No Thoughts of Harm to Others: No Current  Homicidal Intent: No Current Homicidal Plan: No Access to Homicidal Means: No Identified Victim:  (none reported) History of harm to others?:  No Assessment of Violence: None Noted Violent Behavior Description:  (none reported) Does patient have access to weapons?: No Criminal Charges Pending?: No Does patient have a court date: No  Psychosis Hallucinations: None noted Delusions: None noted  Mental Status Report Appear/Hygiene: Bizarre Eye Contact: Fair Motor Activity: Freedom of movement Speech: Slow;Soft Level of Consciousness: Quiet/awake;Irritable Mood: Depressed;Despair;Empty Affect: Anxious;Blunted;Depressed Anxiety Level: Minimal Thought Processes: Coherent;Relevant Judgement: Unimpaired Orientation:  (none )  Cognitive Functioning Concentration: Decreased Memory: Recent Impaired;Remote Impaired IQ: Average Insight: Poor Impulse Control: Poor Appetite: Poor Weight Loss: 0  (0) Weight Gain: 0  Sleep: Increased Total Hours of Sleep: 9  Vegetative Symptoms: Staying in bed;Not bathing;Decreased grooming  ADLScreening [facility]) Patient's cognitive ability adequate to safely complete daily activities?: Yes Patient able to express need for assistance with ADLs?: Yes Independently performs ADLs?: Yes (appropriate for developmental age)  Abuse/Neglect [facility]) Physical Abuse: Denies Verbal Abuse: Denies Sexual Abuse: Denies  Prior Inpatient Therapy Prior Inpatient Therapy: No Prior Therapy Dates:  (none) Prior Therapy Facilty/Provider(s):  (none ) Reason for Treatment: na  Prior Outpatient Therapy Prior Outpatient Therapy: No Prior Therapy Facilty/Provider(s):  (none reported) Reason for Treatment:  (na)  ADL Screening (condition at time of admission) Patient's cognitive ability adequate to safely complete daily activities?: Yes Patient able to express need for assistance with ADLs?: Yes Independently performs ADLs?: Yes (appropriate for developmental age)       Abuse/Neglect Assessment (Assessment to be complete while patient is alone) Physical Abuse: Denies Verbal Abuse:  Denies Sexual Abuse: Denies Values / Beliefs Cultural Requests During Hospitalization: None Spiritual Requests During Hospitalization: None        Additional Information 1:1 In Past 12 Months?: No CIRT Risk: No Elopement Risk: No Does patient have medical clearance?: Yes     Disposition: Pending on waiting list at [facility] at present there are no beds for the consumer to be considered at this time. Patient was also referred to [facility] Disposition Disposition of Patient: Referred to Patient referred to: Other (Comment)  On Site Evaluation by:   Reviewed with Physician:     Phillip Heal LaVerne 02/21/2012 11:01 PM

## 2012-02-21 NOTE — ED Notes (Signed)
Pt resting in bed with eyes closed pt still not communicating at this time. Pt easily aroused pt opening eyes but not talking at this time.

## 2012-02-21 NOTE — ED Notes (Signed)
Pt with n/v x 1. MD called and stated he would order medication for pt.

## 2012-02-21 NOTE — ED Notes (Signed)
Friends and family at bedside.

## 2012-02-21 NOTE — ED Notes (Signed)
ZOX:WRUE<AV> Expected date:<BR> Expected time:<BR> Means of arrival:<BR> Comments:<BR> EMS/&quot;catatonic state&quot;

## 2012-02-21 NOTE — ED Notes (Signed)
Called lab about pt's tylenol and aspirin level. Lab states it was "just pulled from the pending." should be done in 10-15 minutes

## 2012-02-22 ENCOUNTER — Encounter (HOSPITAL_COMMUNITY): Payer: Self-pay

## 2012-02-22 ENCOUNTER — Inpatient Hospital Stay (HOSPITAL_COMMUNITY)
Admission: RE | Admit: 2012-02-22 | Discharge: 2012-02-28 | DRG: 885 | Disposition: A | Discharge status: Home / Self Care | Payer: Medicaid Other | Source: Ambulatory Visit | Attending: Psychiatry | Admitting: Psychiatry

## 2012-02-22 ENCOUNTER — Inpatient Hospital Stay (HOSPITAL_COMMUNITY): Admission: RE | Admit: 2012-02-22 | Payer: Self-pay | Source: Ambulatory Visit | Admitting: Psychiatry

## 2012-02-22 ENCOUNTER — Inpatient Hospital Stay (HOSPITAL_COMMUNITY): Admit: 2012-02-22 | Payer: Self-pay

## 2012-02-22 DIAGNOSIS — F332 Major depressive disorder, recurrent severe without psychotic features: Secondary | ICD-10-CM

## 2012-02-22 DIAGNOSIS — F339 Major depressive disorder, recurrent, unspecified: Principal | ICD-10-CM | POA: Diagnosis present

## 2012-02-22 DIAGNOSIS — F068 Other specified mental disorders due to known physiological condition: Secondary | ICD-10-CM | POA: Diagnosis present

## 2012-02-22 DIAGNOSIS — F1010 Alcohol abuse, uncomplicated: Secondary | ICD-10-CM | POA: Diagnosis present

## 2012-02-22 DIAGNOSIS — F061 Catatonic disorder due to known physiological condition: Secondary | ICD-10-CM

## 2012-02-22 LAB — GLUCOSE, CAPILLARY
Glucose-Capillary: 109 mg/dL — ABNORMAL HIGH (ref 70–99)
Glucose-Capillary: 101 mg/dL — ABNORMAL HIGH (ref 70–99)

## 2012-02-22 MED ORDER — ACETAMINOPHEN 325 MG PO TABS
650.0000 mg | ORAL_TABLET | Freq: Four times a day (QID) | ORAL | Status: DC | PRN
Start: 2012-02-22 — End: 2012-02-28
  Administered 2012-02-23 – 2012-02-26 (×4): 650 mg via ORAL

## 2012-02-22 MED ORDER — HYDROXYZINE HCL 25 MG PO TABS
25.0000 mg | ORAL_TABLET | Freq: Every evening | ORAL | Status: DC | PRN
  Administered 2012-02-25 – 2012-02-27 (×2): 25 mg via ORAL
  Filled 2012-02-22: qty 14

## 2012-02-22 MED ORDER — GABAPENTIN 300 MG PO CAPS
300.0000 mg | ORAL_CAPSULE | Freq: Three times a day (TID) | ORAL | Status: DC
  Administered 2012-02-22 – 2012-02-25 (×9): 300 mg via ORAL
  Filled 2012-02-22 (×16): qty 1

## 2012-02-22 MED ORDER — NABUMETONE 500 MG PO TABS
500.0000 mg | ORAL_TABLET | Freq: Every day | ORAL | Status: DC
  Administered 2012-02-23 – 2012-02-25 (×3): 500 mg via ORAL
  Filled 2012-02-22 (×6): qty 1

## 2012-02-22 MED ORDER — METFORMIN HCL 500 MG PO TABS
500.0000 mg | ORAL_TABLET | Freq: Two times a day (BID) | ORAL | Status: DC
  Administered 2012-02-23 – 2012-02-28 (×11): 500 mg via ORAL
  Filled 2012-02-22 (×12): qty 1
  Filled 2012-02-22 (×2): qty 28
  Filled 2012-02-22 (×2): qty 1

## 2012-02-22 MED ORDER — ALBUTEROL SULFATE HFA 108 (90 BASE) MCG/ACT IN AERS: 2.0000 | INHALATION_SPRAY | RESPIRATORY_TRACT | Status: DC | PRN

## 2012-02-22 MED ORDER — ALUM & MAG HYDROXIDE-SIMETH 200-200-20 MG/5ML PO SUSP: 30.0000 mL | ORAL | Status: DC | PRN

## 2012-02-22 MED ORDER — DULOXETINE HCL 30 MG PO CPEP
30.0000 mg | ORAL_CAPSULE | Freq: Every day | ORAL | Status: DC
  Administered 2012-02-23 – 2012-02-28 (×6): 30 mg via ORAL
  Filled 2012-02-22: qty 14
  Filled 2012-02-22 (×9): qty 1

## 2012-02-22 MED ORDER — GLIPIZIDE ER 10 MG PO TB24
10.0000 mg | ORAL_TABLET | Freq: Every day | ORAL | Status: DC
  Administered 2012-02-23 – 2012-02-28 (×6): 10 mg via ORAL
  Filled 2012-02-22 (×2): qty 1
  Filled 2012-02-22: qty 14
  Filled 2012-02-22 (×5): qty 1
  Filled 2012-02-22: qty 2

## 2012-02-22 MED ORDER — MAGNESIUM HYDROXIDE 400 MG/5ML PO SUSP: 30.0000 mL | Freq: Every day | ORAL | Status: DC | PRN

## 2012-02-22 MED ORDER — AMLODIPINE BESYLATE 10 MG PO TABS
10.0000 mg | ORAL_TABLET | Freq: Every day | ORAL | Status: DC
  Administered 2012-02-23 – 2012-02-28 (×6): 10 mg via ORAL
  Filled 2012-02-22 (×3): qty 1
  Filled 2012-02-22: qty 2
  Filled 2012-02-22 (×3): qty 1
  Filled 2012-02-22: qty 14
  Filled 2012-02-22: qty 1

## 2012-02-22 NOTE — Progress Notes (Signed)
D: Pt has passive SI but contracts for safety.. Pt is withdrawn and hardly speaks. Pt is currently sleeping. Pt did not attend wrap-up group.  A: Pt was offered support and encouragement. Pt was given scheduled medications. Pt was encourage to attend groups. Q 15 minute checks were done for safety.  R:Pt did not attend groups and does not interacts well with peers and staff. Pt istaking medication. Pt has no complaints.Pt receptive to treatment and safety maintained on unit. Pt remains on a 1:1 for safety.

## 2012-02-22 NOTE — Tx Team (Signed)
Initial Interdisciplinary Treatment Plan  PATIENT STRENGTHS: (choose at least two) Motivation for treatment/growth Supportive family/friends  PATIENT STRESSORS: Financial difficulties Health problems   PROBLEM LIST: Problem List/Patient Goals Date to be addressed Date deferred Reason deferred Estimated date of resolution  Suicidal Ideations      Anxiety                                                 DISCHARGE CRITERIA:  Improved stabilization in mood, thinking, and/or behavior Motivation to continue treatment in a less acute level of care Verbal commitment to aftercare and medication compliance  PRELIMINARY DISCHARGE PLAN: Attend PHP/IOP Outpatient therapy  PATIENT/FAMIILY INVOLVEMENT: This treatment plan has been presented to and reviewed with the patient, Diana Henderson, and/or family member,   The patient and family have been given the opportunity to ask questions and make suggestions.  Noah Charon 02/22/2012, 6:29 PM
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
## 2012-02-22 NOTE — Progress Notes (Signed)
Psychoeducational Group Note  Date:  02/22/2012 Time:  2000  Group Topic/Focus:  Wrap-Up Group:   The focus of this group is to help patients review their daily goal of treatment and discuss progress on daily workbooks.  Participation Level: Did Not Attend  Participation Quality:  Not Applicable  Affect:  Not Applicable  Cognitive:  Not Applicable  Insight:  Not Applicable  Engagement in Group: Not Applicable  Additional Comments:  The patient did not feel well enough to attend group.   Hazle Coca S 02/22/2012, 11:29 PM
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
## 2012-02-22 NOTE — BHH Counselor (Signed)
Accepted to [facility] by Verne Spurr, PA to Dr. Dan Humphreys [phone]). Completed support documentation. Pt is voluntary & to be transported via security. Updated EDP & RN.

## 2012-02-22 NOTE — ED Provider Notes (Signed)
Pt presented yesterday in catatonic state. Sleeping today but awakens, seems confused and when asked how she is feeling states "I don't know".  Waiting placement per ACT.   Devoria Albe, MD, FACEP   Ward Givens, MD 02/22/12 [phone]

## 2012-02-22 NOTE — ED Notes (Signed)
Report called to [place] at [facility].  Security called to transport pt to [facility]

## 2012-02-23 DIAGNOSIS — F1010 Alcohol abuse, uncomplicated: Secondary | ICD-10-CM

## 2012-02-23 DIAGNOSIS — F339 Major depressive disorder, recurrent, unspecified: Principal | ICD-10-CM

## 2012-02-23 DIAGNOSIS — F068 Other specified mental disorders due to known physiological condition: Secondary | ICD-10-CM

## 2012-02-23 NOTE — H&P (Signed)
  Pt was seen by me today and I agree with the key elements documented in H&P.  
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
## 2012-02-23 NOTE — Progress Notes (Signed)
Pt up in dayroom with MHT at side. Complaining of R foot pain of 8/10. Elevated in wheelchair. She continues with rocking movement. Displays thought blocking. Endorses AH but is unable to provide further detail. Denying SI/HI/VH at present. Pt supported, given tylenol for pain. 1:1 continued for pt safety. Pt remains safe. Diana Henderson

## 2012-02-23 NOTE — Progress Notes (Signed)
D   Pt was assisted in a bath and grooming  She has been up in the wheelchair  She has attended groups and going to the cafeteria for meals  She remains unstable due to psychotic symptoms and recent surgical history A   Verbal support given and medications administered and effectiveness monitored  Pt on 1:1 R   Safe at present time

## 2012-02-23 NOTE — Progress Notes (Addendum)
D   Pt is in her bed this morning resting  She has some rocking movements that are very repetitive which are not seen when she is laying back in bed  She is depressed and has somewhat of a poverty of speech  She mostly answers questions with one word or nods her head   Her affect is flat and she is very slow to move  Pt is at risk for a fall due to recent surgery to her foot and being unable to [place] a cane and wear her boot  And because of her lethargy A   Verbal support given  Medications administered and effectiveness monitored  Pt on 1:1  R   safe at present
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
## 2012-02-23 NOTE — Progress Notes (Signed)
D   Pt continues to be a high fall risk due to her depression psycotic symptoms and unsteady gait  A   Pt on 1:1   R   Safe at present

## 2012-02-23 NOTE — Progress Notes (Signed)
[facility] Group Notes:  (Counselor/Nursing/MHT/Case Management/Adjunct)  02/23/2012 12:13 PM  Type of Therapy:  Discharge Planning/ Group Therapy  Participation Level:  Did Not attend   Christen Butter 02/23/2012, 12:13 PM

## 2012-02-23 NOTE — Progress Notes (Signed)
Patient ID: Diana Henderson, female   DOB: 10-28-1970, 41 y.o.   MRN: [id number]  See DAR note in paper chart.

## 2012-02-24 LAB — COMPREHENSIVE METABOLIC PANEL
Albumin: 3.4 g/dL — ABNORMAL LOW (ref 3.5–5.2)
BUN: 8 mg/dL (ref 6–23)
Calcium: 9.1 mg/dL (ref 8.4–10.5)
Chloride: 99 mEq/L (ref 96–112)
Creatinine, Ser: 0.85 mg/dL (ref 0.50–1.10)
Total Bilirubin: 0.3 mg/dL (ref 0.3–1.2)

## 2012-02-24 LAB — LIPID PANEL
Cholesterol: 165 mg/dL (ref 0–200)
HDL: 61 mg/dL (ref >39)

## 2012-02-24 LAB — TSH: TSH: 1.174 u[IU]/mL (ref 0.350–4.500)

## 2012-02-24 LAB — T4, FREE: Free T4: 1.21 ng/dL (ref 0.80–1.80)

## 2012-02-24 LAB — GLUCOSE, CAPILLARY: Glucose-Capillary: 127 mg/dL — ABNORMAL HIGH (ref 70–99)

## 2012-02-24 MED ORDER — RISPERIDONE 0.5 MG PO TABS
0.5000 mg | ORAL_TABLET | Freq: Every day | ORAL | Status: DC
  Administered 2012-02-24: 0.5 mg via ORAL
  Filled 2012-02-24 (×4): qty 1

## 2012-02-24 NOTE — Progress Notes (Signed)
Pt observed resting in bed with eyes closed. RR WNL, even and unlabored. Light, audible snoring overheard. 1:1 staff at bedside for safety. Pt remains safe. Diana Henderson

## 2012-02-24 NOTE — Progress Notes (Signed)
D   Pt remains a high fall risk due to her mental status and unsteady gait A   Pt on 1:1 R  Safe at present

## 2012-02-24 NOTE — Progress Notes (Signed)
Psychoeducational Group Note  Date:  02/24/2012 Time:  2000  Group Topic/Focus:  Wrap-Up Group:   The focus of this group is to help patients review their daily goal of treatment and discuss progress on daily workbooks.  Participation Level:  Minimal  Participation Quality:  Appropriate  Affect:  Appropriate  Cognitive:  Appropriate  Insight:  Good  Engagement in Group:  Good  Additional Comments:  Patient attended and participated in group tonight  Scot Dock 02/24/2012, 3:44 AM

## 2012-02-24 NOTE — Progress Notes (Signed)
Patient ID: Merdis Delay, female   DOB: March 07, 1971, 41 y.o.   MRN: [id number]   [facility] Group Notes:  (Counselor/Nursing/MHT/Case Management/Adjunct)  02/24/2012 11 AM  Type of Therapy:  Aftercare Planning, Group Therapy, Dance/Movement Therapy   Participation Level:  Active  Participation Quality:  Appropriate  Affect:  Appropriate  Cognitive:  Appropriate  Insight:  Limited  Engagement in Group:  Limited  Engagement in Therapy:  Limited  Modes of Intervention:  Clarification, Problem-solving, Role-play, Socialization and Support  Summary of Progress/Problems: After Care: Pt. attended and participated in aftercare planning group. Pt. verbally accepted information on suicide prevention, warning signs to look for with suicide and crisis line numbers to use. Pt. shared that she ready to see her 12 year old daughter and wants to go back to [facility] for cooking.  Counseling:  Therapist and group members discussed positive perception as well as things within our control and things outside of our control. Therapist provided examples and group members discussed our options and choices when dealing with various life situations. Pt did not share but seemed attentive and alert during counseling portion of group.  Cassidi Long 02/24/2012. 11:52 AM

## 2012-02-24 NOTE — Progress Notes (Signed)
Psychoeducational Group Note  Date:  02/24/2012 Time:  0945 am  Group Topic/Focus:  Making Healthy Choices:   The focus of this group is to help patients identify negative/unhealthy choices they were using prior to admission and identify positive/healthier coping strategies to replace them upon discharge.  Participation Level:  Minimal  Participation Quality:  Attentive  Affect:  Blunted  Cognitive:  Alert  Insight:  Limited  Engagement in Group:  Limited  Additional Comments:    Andrena Mews 02/24/2012, 10:29 AM
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
## 2012-02-24 NOTE — Progress Notes (Signed)
Patient ID: Merdis Delay, female   DOB: [birth year], 41 y.o.   MRN: [id number]  Seen today. She is on wheelchair. Poor historian. appeared confused. Not sure why she is here.  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   AXIS IV  Financial occupational issues   AXIS V  31-40 impairment in reality testing     Treatment Plan Summary:    Will continue to observe for high blood sugar  Start risperidone 0.5 mg qhs

## 2012-02-24 NOTE — Progress Notes (Signed)
Pt is resting in bed with eyes closed. RR WNL. No distress noted, complaints voiced. 1:1 obs continued for safety and pt remains safe. Lawrence Marseilles

## 2012-02-24 NOTE — Progress Notes (Signed)
Pt in dayroom with MHT staff at side. She continues to display rocking in her wheelchair though it seems somewhat decreased from this time last evening. She rates her depression at an 8/10 (with 10 being the worst) and is sad that she did not have visitors today. Denying pain at the moment or other physical problems. Denies AVH/SI/HI. Pt given support and reassurance. Explained prn sleep med should she need it later tonight. 1:1 obs continued for safety. Pt verbalizes understanding. Pt remains safe. Lawrence Marseilles

## 2012-02-24 NOTE — Progress Notes (Signed)
D   Pt is unsteady on her feet and very slow to respond verbally and physically  She uses a wheelchair and needs assist to bath and go to bathroom A   Pt on 1:1 R   Safe at present time

## 2012-02-24 NOTE — Progress Notes (Signed)
D   Pt was up in her room this morning being assisted with morning ADL's  She has an unsteady gait and upper body rocking motions and is a high fall risk  She is less thought blocking her affect has improved A   Pt on 1:1  Verbal support given   Medications administered and effectiveness monitored  R   Pt safe at present

## 2012-02-25 DIAGNOSIS — F1010 Alcohol abuse, uncomplicated: Secondary | ICD-10-CM | POA: Diagnosis present

## 2012-02-25 DIAGNOSIS — F061 Catatonic disorder due to known physiological condition: Secondary | ICD-10-CM | POA: Diagnosis present

## 2012-02-25 LAB — GLUCOSE, CAPILLARY: Glucose-Capillary: 140 mg/dL — ABNORMAL HIGH (ref 70–99)

## 2012-02-25 MED ORDER — GABAPENTIN 300 MG PO CAPS
300.0000 mg | ORAL_CAPSULE | ORAL | Status: DC
  Administered 2012-02-25 – 2012-02-28 (×9): 300 mg via ORAL
  Filled 2012-02-25 (×5): qty 1
  Filled 2012-02-25: qty 42
  Filled 2012-02-25 (×2): qty 1
  Filled 2012-02-25: qty 42
  Filled 2012-02-25 (×2): qty 1
  Filled 2012-02-25: qty 42
  Filled 2012-02-25 (×2): qty 1

## 2012-02-25 MED ORDER — NABUMETONE 500 MG PO TABS
500.0000 mg | ORAL_TABLET | ORAL | Status: DC
  Administered 2012-02-25 – 2012-02-28 (×6): 500 mg via ORAL
  Filled 2012-02-25: qty 28
  Filled 2012-02-25: qty 1
  Filled 2012-02-25: qty 28
  Filled 2012-02-25 (×7): qty 1

## 2012-02-25 MED ORDER — RISPERIDONE 1 MG PO TABS
1.0000 mg | ORAL_TABLET | Freq: Every day | ORAL | Status: DC
  Administered 2012-02-25 – 2012-02-27 (×3): 1 mg via ORAL
  Filled 2012-02-25 (×4): qty 1
  Filled 2012-02-25: qty 14

## 2012-02-25 NOTE — Discharge Planning (Signed)
02/25/2012  SW met with Merdis Delay in discharge planning group.  SW found Jacqualine Sorey to need contacts made on their behalf with follow-up providers.  Pt states she can't recall how she got to the hospital.  Pt states she needs a note for school to be excused at [employer]  SW will continue to assess for referrals.  Clarice Pole, LCASA 02/25/2012, 10:19 AM

## 2012-02-25 NOTE — Progress Notes (Signed)
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  Alcohol Abuse.    The patient was seen today and reports the following:   ADL's: Good.  Sleep: The patient reports to having difficulty initiating and maintaining sleep last night but according to staff, the patient slept 6.75 hours.  Appetite: The patient reports a decreased appetite.   Mild>(1-10) >Severe  Hopelessness (1-10): 0  Depression (1-10): 0  Anxiety (1-10): 0   Suicidal Ideation: The patient denies any current suicidal ideations today.  Plan: No  Intent: No  Means: No   Homicidal Ideation: The patient denies any homicidal ideations today. Plan: No  Intent: No.  Means: No   General Appearance/Behavior: The patient was cooperative today with the treatment team.  Eye Contact: Good.  Speech: Appropriate in rate and volume with no pressuring noted.  Motor Behavior: wnl.  Level of Consciousness: Alert and Oriented x 3.  Mental Status: Alert and Oriented x 3.  Mood: Depressed - No depression reported today.  Affect: Mildly constricted.  Anxiety Level: No anxiety reported.  Thought Process: Episodic auditory hallucinations reported.  Thought Content: The patient reports episodic auditory hallucinations today but denies any visual hallucinations or delusional thinking.   Perception:   Episodic auditory hallucinations reported.  Judgment: Fair.  Insight: Fair.  Cognition: Oriented to person, place and time.  Sleep:  Number of Hours: 6.75    Vital Signs:Blood pressure 141/100, pulse 106, temperature 98.4 F (36.9 C), temperature source Oral, resp. rate 19, last menstrual period 02/15/2012.  Current Medications: Current Facility-Administered Medications  Medication Dose Route Frequency Provider Last Rate Last Dose  . acetaminophen (TYLENOL) tablet 650 mg  650 mg Oral Q6H PRN Mickeal Skinner, MD   650 mg  at 02/25/12 0805  . albuterol (PROVENTIL HFA;VENTOLIN HFA) 108 (90 BASE) MCG/ACT inhaler 2 puff  2 puff Inhalation Q4H PRN Mickeal Skinner, MD      . alum & mag hydroxide-simeth (MAALOX/MYLANTA) 200-200-20 MG/5ML suspension 30 mL  30 mL Oral Q4H PRN Mickeal Skinner, MD      . amLODipine (NORVASC) tablet 10 mg  10 mg Oral Daily Curlene Labrum Readling, MD   10 mg at 02/25/12 0802  . DULoxetine (CYMBALTA) DR capsule 30 mg  30 mg Oral Daily Curlene Labrum Readling, MD   30 mg at 02/25/12 0802  . gabapentin (NEURONTIN) capsule 300 mg  300 mg Oral [id number] Randy D Readling, MD      . glipiZIDE (GLUCOTROL XL) 24 hr tablet 10 mg  10 mg Oral Q breakfast Curlene Labrum Readling, MD   10 mg at 02/25/12 0802  . hydrOXYzine (ATARAX/VISTARIL) tablet 25 mg  25 mg Oral QHS PRN Mickeal Skinner, MD      . magnesium hydroxide (MILK OF MAGNESIA) suspension 30 mL  30 mL Oral Daily PRN Mickeal Skinner, MD      . metFORMIN (GLUCOPHAGE) tablet 500 mg  500 mg Oral BID WC Curlene Labrum Readling, MD   500 mg at 02/25/12 0802  . nabumetone (RELAFEN) tablet 500 mg  500 mg Oral BH-qamhs Randy D Readling, MD      . risperiDONE (RISPERDAL) tablet 1 mg  1 mg Oral QHS Curlene Labrum Readling, MD      . DISCONTD: gabapentin (NEURONTIN) capsule 300 mg  300 mg Oral TID Mickeal Skinner, MD   300 mg at 02/25/12 1151  . DISCONTD: nabumetone (RELAFEN)  tablet 500 mg  500 mg Oral Daily Mickeal Skinner, MD   500 mg at 02/25/12 0802  . DISCONTD: risperiDONE (RISPERDAL) tablet 0.5 mg  0.5 mg Oral QHS Wonda Cerise, MD   0.5 mg at 02/24/12 2149   Lab Results:  Results for orders placed during the hospital encounter of 02/22/12 (from the past 48 hour(s))  GLUCOSE, CAPILLARY     Status: Abnormal   Collection Time   02/24/12  6:12 AM      Component Value Range Comment   Glucose-Capillary 127 (*) 70 - 99 mg/dL   COMPREHENSIVE METABOLIC PANEL     Status: Abnormal   Collection Time   02/24/12  7:10 AM      Component Value Range Comment   Sodium 134 (*) 135 - 145 mEq/L    Potassium  4.2  3.5 - 5.1 mEq/L    Chloride 99  96 - 112 mEq/L    CO2 28  19 - 32 mEq/L    Glucose, Bld 133 (*) 70 - 99 mg/dL    BUN 8  6 - 23 mg/dL    Creatinine, Ser 4.09  0.50 - 1.10 mg/dL    Calcium 9.1  8.4 - 81.1 mg/dL    Total Protein 6.6  6.0 - 8.3 g/dL    Albumin 3.4 (*) 3.5 - 5.2 g/dL    AST 15  0 - 37 U/L    ALT 14  0 - 35 U/L    Alkaline Phosphatase 70  39 - 117 U/L    Total Bilirubin 0.3  0.3 - 1.2 mg/dL    GFR calc non Af Amer 83 (*) >90 mL/min    GFR calc Af Amer >90  >90 mL/min   TSH     Status: Normal   Collection Time   02/24/12  7:10 AM      Component Value Range Comment   TSH 1.174  0.350 - 4.500 uIU/mL   T4, FREE     Status: Normal   Collection Time   02/24/12  7:10 AM      Component Value Range Comment   Free T4 1.21  0.80 - 1.80 ng/dL   LIPID PANEL     Status: Normal   Collection Time   02/24/12  7:10 AM      Component Value Range Comment   Cholesterol 165  0 - 200 mg/dL    Triglycerides 74  <914 mg/dL    HDL 61  >78 mg/dL    Total CHOL/HDL Ratio 2.7      VLDL 15  0 - 40 mg/dL    LDL Cholesterol 89  0 - 99 mg/dL   GLUCOSE, CAPILLARY     Status: Abnormal   Collection Time   02/25/12  6:06 AM      Component Value Range Comment   Glucose-Capillary 140 (*) 70 - 99 mg/dL    Physical Findings: AIMS: Facial and Oral Movements Muscles of Facial Expression: Moderate Lips and Perioral Area: Minimal Jaw: None, normal Tongue: None, normal,Extremity Movements Upper (arms, wrists, hands, fingers): None, normal Lower (legs, knees, ankles, toes): None, normal, Trunk Movements Neck, shoulders, hips: None, normal, Overall Severity Severity of abnormal movements (highest score from questions above): Moderate Incapacitation due to abnormal movements: Minimal Patient's awareness of abnormal movements (rate only patient's report): No Awareness, Dental Status Current problems with teeth and/or dentures?: No Does patient usually wear dentures?: No   Review of Systems:    Neurological: The patient denies any headaches  today. He denies any seizures or dizziness.  G.I.: The patient denies any constipation today or G.I. Upset.  Musculoskeletal: The patient denies any muscle or skeletal difficulties today.  Extremities:  S/P operation of Right Foot.  Time was spent today discussing with the patient her current symptoms. The patient states that she is having difficulty initiating and maintaining sleep but according to staff she slept 6.75 hours last night.  The patient reports a decreased appetite and denies any significant feelings of sadness, anhedonia or depressed mood today.  The patient denies any suicidal or homicidal ideations but reports some auditory hallucinations last night.  She denies any visual hallucinations or delusional thinking and denies any anxiety symptoms.  The patient denies any medication related side effects today or other concerns.  Treatment Plan Summary:  1. Daily contact with patient to assess and evaluate symptoms and progress in treatment  2. Medication management  3. The patient will deny suicidal ideations or homicidal ideations for 48 hours prior to discharge and have a depression and anxiety rating of 3 or less. The patient will also deny any auditory or visual hallucinations or delusional thinking and display no manic or hypomanic behaviors.  4. The patient will deny any symptoms of substance withdrawal at time of discharge.   Plan:  1. Will continue the patient on the medication Cymbalta at 30 mgs po q am for depression and pain. 2. Will increase the medication Risperdal to 1 mg po qhs to further address her psychotic symptoms. 3. Will continue the medication Neurontin at 300 mgs po q am, 2 pm, hs for anxiety, chronic pain and mood stabilization. 4. Will continue the patient on her non-psychiatric medications last listed above. 5. Will order CBG's BID-WC to evaluate the patient's blood sugars. 6. Laboratory Studies reviewed.  7.  Will continue to monitor.  8. Will continue the patient on a 1 to 1 for fall risk.  RANDY READLING 02/25/2012, 2:45 PM

## 2012-02-25 NOTE — Progress Notes (Signed)
Psychoeducational Group Note  Date:  02/25/2012 Time:  2000  Group Topic/Focus:  Goals Group:   The focus of this group is to help patients establish daily goals to achieve during treatment and discuss how the patient can incorporate goal setting into their daily lives to aide in recovery.  Participation Level:  Active  Participation Quality:  Appropriate  Affect:  Appropriate  Cognitive:  Appropriate  Insight:  Good  Engagement in Group:  Limited  Additional Comments: Patient stated that she had a good day due to the fact that she received a visit from her daughter and sister and because she was able to get in contact with a few friends over the phone. Patient noted an increase in appetite.  Lyndee Hensen 02/25/2012, 9:14 PM

## 2012-02-25 NOTE — Progress Notes (Signed)
Pt continues to rest in bed with eyes closed. RR WNL, even and unlabored. 1:1 staff at bedside for safety. Pt remains safe. Lawrence Marseilles

## 2012-02-25 NOTE — Progress Notes (Signed)
Pt resting in bed with eyes closed. Shifting positions prn. RR WNL, even and unlabored. No distress noted, complaints voiced. 1:1 staff at bedside for pt safety. She remains safe. Lawrence Marseilles

## 2012-02-25 NOTE — Progress Notes (Signed)
02/25/2012         Time: 0930      Group Topic/Focus: The focus of this group is on enhancing patients' problem solving skills, which involves identifying the problem, brainstorming solutions and choosing and trying a solution.  Participation Level: Active  Participation Quality: Attentive  Affect: Blunted  Cognitive: Alert   Additional Comments: Patient participated appropriately, was able to offer suggestions during group activity.   Angy Swearengin 02/25/2012 10:03 AM

## 2012-02-25 NOTE — Progress Notes (Signed)
Psychoeducational Group Note  Date:  02/25/2012 Time:  2000  Group Topic/Focus:  Wrap-Up Group:   The focus of this group is to help patients review their daily goal of treatment and discuss progress on daily workbooks.  Participation Level:  Active  Participation Quality:  Appropriate  Affect:  Appropriate  Cognitive:  Appropriate  Insight:  Good  Engagement in Group:  Good  Additional Comments: Patient attended and participated in group tonight. She reported that her day was good. She went out side, colored, and attended all her groups. Pt advised that she did not have any visitors. She did not know how to get in touch with her family. She was afraid that her family did not know where she was. Her nurse advised that there are some family members number in her chart, we could help her to contact family after group. Pt reports that she would like to develop a support system in her community where she could go to talk with people and meet new people. She will need help setting up that support system and also help with setting up transportation to get there   Scot Dock 02/25/2012, 12:44 AM

## 2012-02-25 NOTE — Progress Notes (Signed)
Patient ID: Diana Henderson, female   DOB: April 20, 1971, 41 y.o.   MRN: [id number]   See paper chart for 1:1 notes.

## 2012-02-26 LAB — GLUCOSE, CAPILLARY: Glucose-Capillary: 102 mg/dL — ABNORMAL HIGH (ref 70–99)

## 2012-02-26 NOTE — Progress Notes (Signed)
[facility] Group Notes:  (Counselor/Nursing/MHT/Case Management/Adjunct)  02/26/2012 1:33 PM  Type of Therapy:  Group Therapy  Participation Level:  Active while in group pulled out to speak to staff  Participation Quality:  Attentive  Affect:  Flat  Cognitive:  Oriented  Insight:  Limited  Engagement in Group:  Good  Engagement in Therapy:  Good  Modes of Intervention:  Problem-solving, Support and exploration  Summary of Progress/Problems:Pt attended group therapy to process feelings surrounding recovery. Pt was able to explore what personal recovery looks and feels like physically, emotionally and spiritually. Pt shared and gave a description of what their personal road to recovery would like in terms of being straight, forked, or with curves.  Pt explored any fears of recovery in terms of possible road blocks and how to find healthy detours and discussed possible ideas for staying on the road to recovery. Pt shared that she is working on recovery and shared about having a nice visit with her sister and daughter. Pt also shared that she needs to take care of her hygiene and eat well. Pt shared she enjoyed breakfast. Vanetta Mulders, LPCA      Katreena Schupp L 02/26/2012, 1:33 PM

## 2012-02-26 NOTE — Progress Notes (Signed)
[facility] MD Progress Note                                         02/26/2012    Diana Henderson 1971-03-31    [id number] Hospital day #4  1. Major depressive disorder, recurrent, with catatonic features   2. Alcohol abuse   The patient was seen today and reports the following:  Sleep: good with medications Appetite: getting better  Mild (1-10) Severe  Depression (1-10): 5 Anxiety (1-10):  10 Hopelessness (1-10): 4  Suicidal Ideation: The patient denies suicidal ideation. Plan: None Intent: None Means: None  Homicidal Ideation: The patient denies homicidal ideation. Plan: None Intent: None Means: None  Eye Contact:  good General Appearance:  Fairly groomed Behavior:  Cooperative   Motor Behavior: normal Speech: clear and goal directed  Mental Status:  Orientation x  3 Level of Consciousness:   alert Mood: depressed Affect: congruent   Thought Process:  linear Thought Content: reports no AH today Perception: intact  Judgment: fair Insight:present Cognition: at least average  VS: oral temperature is 98.6 F (37 C). Her blood pressure is 142/101 and her pulse is 96. Her respiration is 19.   Current Medication:  . amLODipine  10 mg Oral Daily  . DULoxetine  30 mg Oral Daily  . gabapentin  300 mg Oral [id number]  . glipiZIDE  10 mg Oral Q breakfast  . metFORMIN  500 mg Oral BID WC  . nabumetone  500 mg Oral BH-qamhs  . risperiDONE  1 mg Oral QHS    Lab results:  Results for orders placed during the hospital encounter of 02/22/12 (from the past 48 hour(s))  GLUCOSE, CAPILLARY     Status: Abnormal   Collection Time   02/25/12  6:06 AM      Component Value Range Comment   Glucose-Capillary 140 (*) 70 - 99 mg/dL   GLUCOSE, CAPILLARY     Status: Abnormal   Collection Time   02/25/12  5:05 PM      Component Value Range Comment   Glucose-Capillary 118 (*) 70 - 99 mg/dL   GLUCOSE, CAPILLARY     Status: Abnormal   Collection Time   02/26/12  6:15 AM   Component Value Range Comment   Glucose-Capillary 102 (*) 70 - 99 mg/dL    Comment 1 Notify RN        No results found for this or any previous visit for  Last 48 hours.  Group attendance: 50% ROS:    Constitutional: WDWN Adult in NAD   GI: Negative for N,V,D,C   Neuro: Negative for dizziness, blurred vision, visual changes, headaches   Resp: Negative for wheezing, SOB, cough   Cardio: Negative for CP, diaphoresis, fatigue   MSK: Negative for joint pain, swelling, DROM, or ambulatory difficulties.  Time was spent with the patient discussing the current symptoms and the response to treatment. Diana Henderson states that she would like to have her Albuterol inhaler for her wheezing.  Treatment Summary: 1. Admit for crisis management and stabilization. 2. Medication management to reduce current symptoms to base line and improve the patient's overall level of functioning 3. Treat health problems as indicated. 4. Develop treatment plan to decrease risk of relapse upon discharge and the need for readmission. 5. Psycho-social education regarding relapse prevention and self care. 6. Health care follow up as needed for medical problems. 7. Restart  home medications where appropriate.   Treatment Plan: Plan:  1. Will continue the patient on the medication Cymbalta at 30 mgs po q am for depression and pain.  2. Will continue the medication Risperdal to 1 mg po qhs to further address her psychotic symptoms.  3. Will continue the medication Neurontin at 300 mgs po q am, 2 pm, hs for anxiety, chronic pain and mood stabilization.  4. Will continue the patient on her non-psychiatric medications last listed above.  5. Will order CBG's BID-WC to evaluate the patient's blood sugars.  6. Laboratory Studies reviewed.  7. Will continue to monitor.  8. Will continue the patient on a 1 to 1 for fall risk. [name]. Shoichi Mielke [facility] 02/26/2012

## 2012-02-26 NOTE — Progress Notes (Signed)
Psychoeducational Group Note  Date:  02/26/2012 Time:  0930  Group Topic/Focus:  Recovery Goals:   The focus of this group is to identify appropriate goals for recovery and establish a plan to achieve them.  Participation Level: Did Not Attend  Participation Quality:  Not Applicable  Affect:  Not Applicable  Cognitive:  Not Applicable  Insight:  Not Applicable  Engagement in Group: Not Applicable  Additional Comments:  Pt was unable to attend group as she was in the bathroom.  Diana Henderson E 02/26/2012, 11:07 AM

## 2012-02-26 NOTE — Progress Notes (Signed)
[facility] Group Notes:  (Counselor/Nursing/MHT/Case Management/Adjunct)  02/26/2012 5:02 PM  Type of Therapy:  Discharge Planning  Participation Level:  Active  Participation Quality:  Appropriate  Affect:  Blunted  Cognitive:  Oriented  Insight:  Good  Engagement in Group:  Good  Engagement in Therapy:  Good  Modes of Intervention:  Education, Problem-solving and Support  Summary of Progress/Problems: Patient stated that she realized more about why she got depressed. She stated that she had been working at [employer] and had to stop because of having trouble with her foot and eventually surgery. She had this done at [facility] and has an appointment that she was supposed to have today. Her last day of work was July 31st. She stated that she has always worked and this upset her plus now she has not been able to pay the bills and has had utilities cut off. Not able to provide daughter with extra things and this has caused some conflict with them. She feels better now that she had a visit from her sister and daughter and daughter says she just wants her to be better. Concerned about getting a letter to her school so she won't have to drop out. She had noticed that she wasn't jerking her head and was more conscious of stopping this when it started again.   Walid Haig, Aram Beecham 02/26/2012, 5:02 PM

## 2012-02-26 NOTE — Progress Notes (Signed)
NSG 1:1 note: D: Currently sitting in day room, watching TV with peers and sitter who is sitting at pts side per MD order. Appears flat and depressed, but brightens in conversation. Calm and cooperative with assessment. States she has had a good day. When asked to qualify good day, she replied she got to speak to several of her family members and it has helped her feel better. Denies SI/HI/AVH and contracts for safety. Offers no questions or concerns.  A: Safety has been maintained with 1:1 observation. Support and encouragement provided. POC and medications for the shift reviewed and understanding verbalized. Attending groups with appropriate participation. Meds given per MD order.   R: Pt remains safe. She is calm and cooperative and continues to be depressed. She offers no questions or concerns. Will continue 1:1 observation and continue current POC.

## 2012-02-26 NOTE — Progress Notes (Signed)
[facility] Group Notes:  (Counselor/Nursing/MHT/Case Management/Adjunct)  02/26/2012 5:00 PM  Type of Therapy:  Group Therapy  02/25/12  Participation Level:  Active  Participation Quality:  Attentive and Sharing  Affect:  Depressed  Cognitive:  Oriented  Insight:  Good  Engagement in Group:  Good  Engagement in Therapy:  Good  Modes of Intervention:  Education, Problem-solving and Support  Summary of Progress/Problems: Patient talked about how she felt like she was being taken advantage of and that wished family could at least just say thank-you. She exhibited a lot of head movement but was attentive to discussion. Agreed with peers that she liked to have a say so in her life. Talked about concerns of notifying school.   Normand Damron, Aram Beecham 02/26/2012, 5:00 PM

## 2012-02-26 NOTE — Progress Notes (Addendum)
RN 1:1 Note: Patient pleasant and cooperative. Remains on 1:1 for safety secondary to s/p foot surgery. Denies SI/HI. In bed lying on side resting. No complaints of pain. MHT at bedside. Joice Lofts RN MS EdS 02/26/2012  0800

## 2012-02-26 NOTE — Progress Notes (Signed)
1:1 Nursing Note:  Patient continues to sit in Day Room watching TV and interacting with others.  She is pleasant and cooperative.  She interacts with staff well.  She denies any SI/HI/AVH.  She was given tylenol for a headache which she felt relief.    Her behavior continues to be appropriate.

## 2012-02-27 NOTE — Progress Notes (Signed)
The focus of this group is to help patients review their daily goal of treatment and discuss progress on daily workbooks. Pt attended the group and participated in the discussion about recovery goals. Pt stated that as part of her recovery she would like to spend more time with her child and go back to school.

## 2012-02-27 NOTE — Progress Notes (Signed)
02/27/2012         Time: 0930      Group Topic/Focus: The focus of the group is on enhancing the patients' ability to utilize positive relaxation strategies by practicing several that can be used at discharge.  Participation Level: Active  Participation Quality: Active  Affect: Appropriate  Cognitive: Blunted  Additional Comments: Patient participated in the relaxation exercises, reported finding them helpful, and was able to identify other coping skills she can use to help relax upon discharge.    Bhumi Godbey 02/27/2012 1:29 PM

## 2012-02-27 NOTE — Progress Notes (Addendum)
RN 1:1 Note: Patient's affect brighter this afternoon. Patient remains using a wheelchair for safety and on 1:1 observation. Attending groups, hygiene improved. Joice Lofts RN MS EdS 02/27/2012  1:44 PM

## 2012-02-27 NOTE — Progress Notes (Signed)
Psychoeducational Group Note  Date:  02/27/2012 Time:  1100  Group Topic/Focus:  Personal Choices and Values:   The focus of this group is to help patients assess and explore the importance of values in their lives, how their values affect their decisions, how they express their values and what opposes their expression.  Participation Level:  Active  Participation Quality:  Appropriate, Sharing and Supportive  Affect:  Appropriate  Cognitive:  Appropriate  Insight:  Good  Engagement in Group:  Good  Additional Comments: pt was pleasant and happy about going home tomorrow.   Isla Pence M 02/27/2012, 1:22 PM

## 2012-02-27 NOTE — Progress Notes (Signed)
Late entry Note @ 1700 D/T 1-1.D: Pt. Back from recreation.pt using W/C d/t s/p Rt. Foot surgery.Pt with flat affect & depressed mood.Denies AVH  @ this time.Denies SI & HI.A: 1-1 continues for increased pt. Safety.R: Pt. Safety maintained.

## 2012-02-27 NOTE — Discharge Planning (Signed)
02/27/2012  SW met with Merdis Delay in discharge planning group.  SW found Lashawndra Callas to have adequate transportation upon discharge with her sister.  SW found Edie Strothman to need contacts made on their behalf regarding her medication copays.  SW provided transportation resources written on follow-up plan to assist with getting to medical appointments.  SW scheduled follow-ups with [facility] for clinical intake and medication management with Dr. Shela Commons.  SW will continue to assess for referrals.  Clarice Pole, LCASA 02/27/2012, 5:48 PM

## 2012-02-27 NOTE — Progress Notes (Signed)
Psychoeducational Group Note  Date:  02/27/2012 Time:  2000  Group Topic/Focus:  Wrap-Up Group:   The focus of this group is to help patients review their daily goal of treatment and discuss progress on daily workbooks.  Participation Level:  Active  Participation Quality:  Appropriate  Affect:  Flat  Cognitive:  Alert  Insight:  Good  Engagement in Group:  Good  Additional Comments:  Pt stated that she had a good day. She really enjoyed the group where the mental health association had a person come in and share their story.  Kaleen Odea R 02/27/2012, 9:22 PM

## 2012-02-27 NOTE — Progress Notes (Signed)
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   Alcohol Abuse.   The patient was seen today and reports the following:   ADL's: Good.  Sleep: The patient reports to sleeping well last night with her current medications. Appetite: The patient reports a good appetite today.   Mild>(1-10) >Severe  Hopelessness (1-10): 0  Depression (1-10): 0  Anxiety (1-10): 0   Suicidal Ideation: The patient adamantly denies any suicidal ideations today.  Plan: No  Intent: No  Means: No   Homicidal Ideation: The patient adamantly denies any homicidal ideations today.  Plan: No  Intent: No.  Means: No   General Appearance/Behavior: The patient was friendly and cooperative today with this provider.  Eye Contact: Good.  Speech: Appropriate in rate and volume with no pressuring noted.  Motor Behavior: wnl.  Level of Consciousness: Alert and Oriented x 3.  Mental Status: Alert and Oriented x 3.  Mood: Depressed - No depression reported today.  Affect: Full.  Anxiety Level: No anxiety reported.  Thought Process:  wnl.  Thought Content: The patient denies any auditory or visual hallucinations today as well as any delusional thinking.  Perception: wnl.  Judgment: Good.  Insight: Good.  Cognition: Oriented to person, place and time.  Sleep:  Number of Hours: 6.75    Vital Signs:Blood pressure 134/78, pulse 81, temperature 98.1 F (36.7 C), temperature source Oral, resp. rate 16, last menstrual period 02/15/2012.  Current Medications: Current Facility-Administered Medications  Medication Dose Route Frequency Provider Last Rate Last Dose  . acetaminophen (TYLENOL) tablet 650 mg  650 mg Oral Q6H PRN Mickeal Skinner, MD   650 mg at 02/26/12 1316  . albuterol (PROVENTIL HFA;VENTOLIN HFA) 108 (90 BASE) MCG/ACT inhaler 2 puff  2 puff Inhalation Q4H PRN Mickeal Skinner, MD      .  alum & mag hydroxide-simeth (MAALOX/MYLANTA) 200-200-20 MG/5ML suspension 30 mL  30 mL Oral Q4H PRN Mickeal Skinner, MD      . amLODipine (NORVASC) tablet 10 mg  10 mg Oral Daily Curlene Labrum Perley Arthurs, MD   10 mg at 02/27/12 0815  . DULoxetine (CYMBALTA) DR capsule 30 mg  30 mg Oral Daily Curlene Labrum Paullette Mckain, MD   30 mg at 02/27/12 0817  . gabapentin (NEURONTIN) capsule 300 mg  300 mg Oral [id number] Curlene Labrum Trayshawn Durkin, MD   300 mg at 02/27/12 0815  . glipiZIDE (GLUCOTROL XL) 24 hr tablet 10 mg  10 mg Oral Q breakfast Curlene Labrum Hillarie Harrigan, MD   10 mg at 02/27/12 0815  . hydrOXYzine (ATARAX/VISTARIL) tablet 25 mg  25 mg Oral QHS PRN Mickeal Skinner, MD   25 mg at 02/25/12 2142  . magnesium hydroxide (MILK OF MAGNESIA) suspension 30 mL  30 mL Oral Daily PRN Mickeal Skinner, MD      . metFORMIN (GLUCOPHAGE) tablet 500 mg  500 mg Oral BID WC Curlene Labrum Elianie Hubers, MD   500 mg at 02/27/12 0813  . nabumetone (RELAFEN) tablet 500 mg  500 mg Oral BH-qamhs Curlene Labrum Bryston Colocho, MD   500 mg at 02/27/12 0813  . risperiDONE (RISPERDAL) tablet 1 mg  1 mg Oral QHS Curlene Labrum Dhwani Venkatesh, MD   1 mg at 02/26/12 2121   Lab Results:  Results for orders placed during the hospital encounter of 02/22/12 (from the past 48 hour(s))  GLUCOSE, CAPILLARY     Status: Abnormal  Collection Time   02/25/12  5:05 PM      Component Value Range Comment   Glucose-Capillary 118 (*) 70 - 99 mg/dL   GLUCOSE, CAPILLARY     Status: Abnormal   Collection Time   02/26/12  6:15 AM      Component Value Range Comment   Glucose-Capillary 102 (*) 70 - 99 mg/dL    Comment 1 Notify RN     GLUCOSE, CAPILLARY     Status: Abnormal   Collection Time   02/26/12  4:54 PM      Component Value Range Comment   Glucose-Capillary 178 (*) 70 - 99 mg/dL    Physical Findings: AIMS: Facial and Oral Movements Muscles of Facial Expression: Moderate Lips and Perioral Area: Minimal Jaw: None, normal Tongue: None, normal,Extremity Movements Upper (arms, wrists, hands, fingers):  None, normal Lower (legs, knees, ankles, toes): None, normal, Trunk Movements Neck, shoulders, hips: None, normal, Overall Severity Severity of abnormal movements (highest score from questions above): Moderate Incapacitation due to abnormal movements: Minimal Patient's awareness of abnormal movements (rate only patient's report): No Awareness, Dental Status Current problems with teeth and/or dentures?: No Does patient usually wear dentures?: No   Review of Systems:  Neurological: The patient denies any headaches today. He denies any seizures or dizziness.  G.I.: The patient denies any constipation today or G.I. Upset.  Musculoskeletal: The patient denies any muscle or skeletal difficulties today.  Extremities: S/P operation of Right Foot.   Time was spent today discussing with the patient her current symptoms. The patient states that she is now sleeping well when she takes her sleep medications. The patient reports a good appetite and denies any significant feelings of sadness, anhedonia or depressed mood today. The patient adamantly denies any suicidal or homicidal ideations and denies any auditory or visual hallucinations or delusional thinking.  She also denies any anxiety symptoms today. The patient denies any medication related side effects today or other concerns.  Time was spent today discussing with the patient her plans at discharge.  There appears to be some Medicaid and follow up issues remaining and plans will be made for the patient to discharge tomorrow.   Treatment Plan Summary:  1. Daily contact with patient to assess and evaluate symptoms and progress in treatment  2. Medication management  3. The patient will deny suicidal ideations or homicidal ideations for 48 hours prior to discharge and have a depression and anxiety rating of 3 or less. The patient will also deny any auditory or visual hallucinations or delusional thinking and display no manic or hypomanic behaviors.  4.  The patient will deny any symptoms of substance withdrawal at time of discharge.   Plan:  1. Will continue the patient on the medication Cymbalta at 30 mgs po q am for depression and pain.  2. Will continue the medication Risperdal to 1 mg po qhs to address her psychotic symptoms.  3. Will continue the medication Neurontin at 300 mgs po q am, 2 pm, hs for anxiety, chronic pain and mood stabilization.  4. Will continue the patient on her non-psychiatric medications last listed above.  5. Will order CBG's BID-WC to evaluate the patient's blood sugars.  6. Laboratory Studies reviewed.  7. Will continue to monitor.  8. Will continue the patient on a 1 to 1 for fall risk. 9. Probable discharge in the morning.  Nashonda Limberg 02/27/2012, 12:17 PM

## 2012-02-27 NOTE — Tx Team (Addendum)
Interdisciplinary Treatment Plan Update (Adult) Date: 02/25/12  Time Reviewed: 9:57 AM  Progress in Treatment: Attending groups: Yes Participating in groups: Yes Taking medication as prescribed: Yes Tolerating medication: Yes Family/Significant othe contact made: Counselor to contact  Patient understands diagnosis: Yes Discussing patient identified problems/goals with staff: Yes Medical problems stabilized or resolved: Yes Denies suicidal/homicidal ideation: Yes Issues/concerns per patient self-inventory: None identified Other: N/A New problem(s) identified: None Identified Reason for Continuation of Hospitalization: Anxiety Depression Medication stabilization Interventions implemented related to continuation of hospitalization: mood stabilization, medication monitoring and adjustment, group therapy and psycho education, safety checks q 15 mins Additional comments: N/A Estimated length of stay:  Discharge Plan: SW is assessing for appropriate referrals.  New goal(s): N/A Review of initial/current patient goals per problem list:  1. Goal(s): Reduce depressive symptoms  Met: No  Target date: by discharge  As evidenced by: Reducing depression from a 10 to a 3 as reported by pt.  2. Goal (s): Eliminate Suicidal Ideation  Met: No  Target date: by discharge  As evidenced by: Eliminate suicidal ideation.  3. Goal(s): Reduce Psychosis  Met: No  Target date: by discharge  As evidenced by: Reduce psychotic symptoms to baseline, as reported by pt.   Attendees: Patient: Diana Henderson   9:58 AM 02/25/12  Family:    Physician: Franchot Gallo, MD  9:57 AM 02/25/12  Nursing:    Case Manager: Clarice Pole, LCASA  9:57 AM 02/25/12  Counselor: Veto Kemps, MT-BC  9:57 AM 02/25/12  Other:  Joslyn Devon, RN  9:57 AM 02/25/12  Other:    Other:    Other:    Scribe for Treatment Team:  Clarice Pole, LCASA 02/25/12  9:57 AM

## 2012-02-27 NOTE — Progress Notes (Signed)
8 PM Note d/t 1-1D:Pt sitting up in bed holding an appropriate conservation.Affect somewhat blunted & mood sad.A: staff with pt @ all times. 1-1 continues for increased pt safety.R: pt safety maintained.

## 2012-02-27 NOTE — Progress Notes (Signed)
1-1 note @1600  D:Pt. Off unit downstairs for recreation.

## 2012-02-28 MED ORDER — NABUMETONE 500 MG PO TABS: 500.0000 mg | ORAL_TABLET | ORAL | Status: DC

## 2012-02-28 MED ORDER — GABAPENTIN 300 MG PO CAPS: 300.0000 mg | ORAL_CAPSULE | ORAL | Status: DC

## 2012-02-28 MED ORDER — HYDROXYZINE HCL 25 MG PO TABS: 25.0000 mg | ORAL_TABLET | Freq: Every evening | ORAL | Status: AC | PRN

## 2012-02-28 MED ORDER — DULOXETINE HCL 30 MG PO CPEP: 30.0000 mg | ORAL_CAPSULE | Freq: Every day | ORAL | Status: DC

## 2012-02-28 MED ORDER — GLIPIZIDE ER 10 MG PO TB24: 10.0000 mg | ORAL_TABLET | Freq: Every day | ORAL | Status: DC

## 2012-02-28 MED ORDER — METFORMIN HCL 500 MG PO TABS: 500.0000 mg | ORAL_TABLET | Freq: Two times a day (BID) | ORAL | Status: DC

## 2012-02-28 MED ORDER — AMLODIPINE BESYLATE 10 MG PO TABS: 10.0000 mg | ORAL_TABLET | Freq: Every day | ORAL | Status: DC

## 2012-02-28 MED ORDER — ALBUTEROL SULFATE HFA 108 (90 BASE) MCG/ACT IN AERS: 2.0000 | INHALATION_SPRAY | RESPIRATORY_TRACT | Status: DC | PRN

## 2012-02-28 MED ORDER — RISPERIDONE 1 MG PO TABS: 1.0000 mg | ORAL_TABLET | Freq: Every day | ORAL | Status: DC

## 2012-02-28 NOTE — Progress Notes (Signed)
[facility] Adult Inpatient Family/Significant Other Suicide Prevention Education  Suicide Prevention Education:  Education Completed; Geronimo Running (sister) [phone])  has been identified by the patient as the family member/significant other with whom the patient will be residing, and identified as the person(s) who will aid the patient in the event of a mental health crisis (suicidal ideations/suicide attempt).  With written consent from the patient, the family member/significant other has been provided the following suicide prevention education, prior to the and/or following the discharge of the patient.  The suicide prevention education provided includes the following:  Suicide risk factors  Suicide prevention and interventions  National Suicide Hotline telephone number  [facility] assessment telephone number  [facility] Emergency Assistance 911  [facility] and/or Residential Mobile Crisis Unit telephone number  Request made of family/significant other to:  Remove weapons (e.g., guns, rifles, knives), all items previously/currently identified as safety concern.    Remove drugs/medications (over-the-counter, prescriptions, illicit drugs), all items previously/currently identified as a safety concern.  The family member/significant other verbalizes understanding of the suicide prevention education information provided.  The family member/significant other agrees to remove the items of safety concern listed above.   HartisAram Beecham 02/28/2012, 8:27 AM

## 2012-02-28 NOTE — Tx Team (Signed)
Interdisciplinary Treatment Plan Update (Adult) Date: 02/28/2012  Time Reviewed: 10:13 AM  Progress in Treatment: Attending groups: Yes  Participating in groups: Yes  Taking medication as prescribed: Yes  Tolerating medication: Yes  Family/Significant othe contact made: Counselor contacted the patient's sister.   Patient understands diagnosis: Yes  Discussing patient identified problems/goals with staff: Yes  Medical problems stabilized or resolved: Yes  Denies suicidal/homicidal ideation: Yes  Issues/concerns per patient self-inventory: None identified  Other: N/A  New problem(s) identified: None Identified    Additional comments: N/A  Estimated length of stay: discharge today Discharge Plan: Pt discharged to [facility] for medication management and counseling. New goal(s): N/A  Review of initial/current patient goals per problem list:  1. Goal(s): Reduce depressive symptoms   Met: Yes  Target date: by discharge   As evidenced by: Reducing depression from a 10 to a 3 as reported by pt.  2. Goal (s): Eliminate Suicidal Ideation   Met: Yes   Target date: by discharge   As evidenced by: Eliminate suicidal ideation, as per reported by patient for 48 hours consecutively.  3. Goal(s): Reduce Psychosis   Met: Yes  Target date: by discharge   As evidenced by: Reduce psychotic symptoms to baseline, as reported by pt.  Pt reported no hallucinations for the past 48 consecutive hours.  3. Goal(s): Address Aftercare plan  Met: Yes  Target date: by discharge   As evidenced by: Appts have been scheduled with [facility] for medication management and follow-up with [facility] has been arranged for primary care needs for her foot.   Attendees: Patient: Diana Henderson  02/28/2012 10:20 AM  Family:    Physician: Franchot Gallo, MD 02/28/2012 10:13 AM   Nursing:    Case Manager: Clarice Pole, LCASA  02/28/2012 10:13 AM   Counselor: Veto Kemps, MT-BC 02/28/2012 10:13 AM   Other: Joslyn Devon, RN 02/28/2012 10:13 AM   Other:    Other:    Other:    Scribe for Treatment Team:  Clarice Pole, LCASA 02/28/2012 10:13 AM

## 2012-02-28 NOTE — [facility] Suicide Risk Assessment (Signed)
Suicide Risk Assessment  Discharge Assessment     Demographic Factors:  Low socioeconomic status, Living alone and Unemployed  Mental Status Per Nursing Assessment::   On Admission:  Suicidal ideation indicated by patient At Time of Discharge: Time was spent today discussing with the patient her current symptoms. The patient states that she is continuing to sleep well when she takes her sleep medications. The patient reports a good appetite and denies any significant feelings of sadness, anhedonia or depressed mood today. The patient adamantly denies any suicidal or homicidal ideations and denies any auditory or visual hallucinations or delusional thinking.  She also denies any anxiety symptoms today. The patient denies any medication related side effects today or other concerns.   The patient states that she feels ready for discharge and would like to be discharged today.  The patient will be discharged to outpatient follow up as requested.   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  Alcohol Abuse.   The patient was seen today and reports the following:   ADL's: Good.  Sleep: The patient reports to sleeping well with her current medications.  Appetite: The patient reports a good appetite today.   Mild>(1-10) >Severe  Hopelessness (1-10): 0  Depression (1-10): 0  Anxiety (1-10): 0   Suicidal Ideation: The patient adamantly denies any suicidal ideations today.  Plan: No  Intent: No  Means: No   Homicidal Ideation: The patient adamantly denies any homicidal ideations today.  Plan: No  Intent: No.  Means: No   General Appearance/Behavior: The patient remains friendly and cooperative today with this provider.  Eye Contact: Good.  Speech: Appropriate in rate and volume with no pressuring noted.  Motor Behavior: wnl.  Level of Consciousness: Alert and Oriented x 3.  Mental Status: Alert and Oriented  x 3.  Mood: Depressed - No depression reported today.  Affect: Full.  Anxiety Level: No anxiety reported.  Thought Process: wnl.  Thought Content: The patient denies any auditory or visual hallucinations today as well as any delusional thinking.  Perception: wnl.  Judgment: Good.  Insight: Good.  Cognition: Oriented to person, place and time.   Current Medications:  Current Facility-Administered Medications   Medication  Dose  Route  Frequency  Provider  Last Rate  Last Dose   .  acetaminophen (TYLENOL) tablet 650 mg  650 mg  Oral  Q6H PRN  Mickeal Skinner, MD   650 mg at 02/26/12 1316   .  albuterol (PROVENTIL HFA;VENTOLIN HFA) 108 (90 BASE) MCG/ACT inhaler 2 puff  2 puff  Inhalation  Q4H PRN  Mickeal Skinner, MD     .  alum & mag hydroxide-simeth (MAALOX/MYLANTA) 200-200-20 MG/5ML suspension 30 mL  30 mL  Oral  Q4H PRN  Mickeal Skinner, MD     .  amLODipine (NORVASC) tablet 10 mg  10 mg  Oral  Daily  Curlene Labrum Braedyn Kauk, MD   10 mg at 02/27/12 0815   .  DULoxetine (CYMBALTA) DR capsule 30 mg  30 mg  Oral  Daily  Curlene Labrum Tiya Schrupp, MD   30 mg at 02/27/12 0817   .  gabapentin (NEURONTIN) capsule 300 mg  300 mg  Oral  [id number]  Curlene Labrum Saryn Cherry, MD   300 mg at 02/27/12 0815   .  glipiZIDE (GLUCOTROL XL) 24 hr tablet 10 mg  10 mg  Oral  Q breakfast  Ronny Bacon, MD  10 mg at 02/27/12 0815   .  hydrOXYzine (ATARAX/VISTARIL) tablet 25 mg  25 mg  Oral  QHS PRN  Mickeal Skinner, MD   25 mg at 02/25/12 2142   .  magnesium hydroxide (MILK OF MAGNESIA) suspension 30 mL  30 mL  Oral  Daily PRN  Mickeal Skinner, MD     .  metFORMIN (GLUCOPHAGE) tablet 500 mg  500 mg  Oral  BID WC  Curlene Labrum Dmani Mizer, MD   500 mg at 02/27/12 0813   .  nabumetone (RELAFEN) tablet 500 mg  500 mg  Oral  BH-qamhs  Curlene Labrum Kimari Coudriet, MD   500 mg at 02/27/12 0813   .  risperiDONE (RISPERDAL) tablet 1 mg  1 mg  Oral  QHS  Curlene Labrum Briannia Laba, MD   1 mg at 02/26/12 2121    Lab Results:  Results for orders placed during the hospital  encounter of 02/22/12 (from the past 48 hour(s))   GLUCOSE, CAPILLARY Status: Abnormal    Collection Time    02/25/12 5:05 PM   Component  Value  Range  Comment    Glucose-Capillary  118 (*)  70 - 99 mg/dL    GLUCOSE, CAPILLARY Status: Abnormal    Collection Time    02/26/12 6:15 AM   Component  Value  Range  Comment    Glucose-Capillary  102 (*)  70 - 99 mg/dL     Comment 1  Notify RN     GLUCOSE, CAPILLARY Status: Abnormal    Collection Time    02/26/12 4:54 PM   Component  Value  Range  Comment    Glucose-Capillary  178 (*)  70 - 99 mg/dL     Loss Factors: Loss of Employment.  Health Issues.   Historical Factors: Long History of Mental Illness. Non-psychiatric Medical Issues.  Corporate treasurer.    Risk Reduction Factors:   Good Family Support.  Good Insight into Mental Illness.  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    Alcohol Abuse.  AXIS II:   Deferred. AXIS III:  1.  Diabetes Mellitus.   2.  Hypertension.   3.  S/P Myocardial Infarction.  AXIS IV:   Long History of Mental Illness. Non-psychiatric Medical Issues.  Corporate treasurer.  Unemployment.   AXIS V:   GAF at time of admission approximately 35.  GAF at time of discharge approximately 60.  Cognitive Features That Contribute To Risk:  None   Physical Findings:  AIMS: Facial and Oral Movements  Muscles of Facial Expression: Moderate  Lips and Perioral Area: Minimal  Jaw: None, normal  Tongue: None, normal,Extremity Movements  Upper (arms, wrists, hands, fingers): None, normal  Lower (legs, knees, ankles, toes): None, normal, Trunk Movements  Neck, shoulders, hips: None, normal, Overall Severity  Severity of abnormal movements (highest score from  questions above): Moderate  Incapacitation due to abnormal movements: Minimal  Patient's awareness of abnormal movements (rate only patient's report): No Awareness, Dental Status  Current problems with teeth and/or dentures?: No  Does patient usually wear dentures?: No   Review of Systems:  Neurological: The patient denies any headaches today. He denies any seizures or dizziness.  G.I.: The patient denies any constipation today or G.I. Upset.  Musculoskeletal: The patient denies any muscle or skeletal difficulties today.  Extremities: S/P Operation of Right Foot.   Time was spent today discussing with the patient her current symptoms. The patient states that she is continuing to sleep well when she takes her sleep medications. The patient reports a good appetite and denies any significant feelings of sadness, anhedonia or depressed mood today. The patient adamantly denies any suicidal or homicidal ideations and denies any auditory or visual hallucinations or delusional thinking.  She also denies any anxiety symptoms today. The patient denies any medication related side effects today or other concerns.   The patient states that she feels ready for discharge and would like to be discharged today.  The patient will be discharged to outpatient follow up as requested.  Treatment Plan Summary:  1. Daily contact with patient to assess and evaluate symptoms and progress in treatment  2. Medication management  3. The patient will deny suicidal ideations or homicidal ideations for 48 hours prior to discharge and have a depression and anxiety rating of 3 or less. The patient will also deny any auditory or visual hallucinations or delusional thinking and display no manic or hypomanic behaviors.  4. The patient will deny any symptoms of substance withdrawal at time of discharge.   Plan:  1. Will continue the patient on the medication Cymbalta at 30 mgs po q am for depression and pain.  2. Will continue  the medication Risperdal at 1 mg po qhs to address her psychotic symptoms.  3. Will continue the medication Neurontin at 300 mgs po q am, 2 pm, hs for anxiety, chronic pain and mood stabilization.  4. Will continue the patient on her non-psychiatric medications last listed above.  5. Laboratory Studies reviewed.  6. Will continue to monitor.  7. Will continue the patient on a 1 to 1 for fall risk.  8. The patient will be discharge today to outpatient follow up at her request.  Suicide Risk:  Minimal: No identifiable suicidal ideation.  Patients presenting with no risk factors but with morbid ruminations; may be classified as minimal risk based on the severity of the depressive symptoms  Plan Of Care/Follow-up recommendations:  Activity:  Please be careful with walking until cleared by your Foot Doctor. Diet:  Diabetic, Heart Healthy Diet. Other:  Please take all medications only as directed and keep all scheduled follow up appointments.  Please return to your local Emergency Room should you have any thoughts of harming yourself or others.  Diana Henderson 02/28/2012, 12:21 PM

## 2012-02-28 NOTE — Progress Notes (Signed)
Patient discharged home.  Left by wheelchair and transported by mother and daughter  Denies any SI/HI/AVH.  Signed all appropriate paperwork and received samples of her medications.

## 2012-02-28 NOTE — Progress Notes (Signed)
Psychoeducational Group Note  Date:  9/12/2013Time:  0930  Group Topic/Focus:  Rediscovering Joy:   The focus of this group is to explore various ways to relieve stress in a positive manner.  Participation Level: Did Not Attend  Participation Quality:  Not Applicable  Affect:  Not Applicable  Cognitive:  Not Applicable  Insight:  Not Applicable  Engagement in Group: Not Applicable  Additional Comments:  Pt was unable to attend group due to the fact she was taking a shower while the group was in progress.  Tenya Araque E 02/28/2012, 12:28 PM

## 2012-02-28 NOTE — Progress Notes (Signed)
1:1 observation note:  Patient to be discharged home today.  She continues to be with sitter 1:1 to ensure safety.  She is ambulating well with toiletry.  Patient attended treatment team meeting today and discussed her aftercare with members.  She denies any SI/HI/AVH. Continue to reassure patient and ensure safety on unit; patient's behavior is appropriate on unit.

## 2012-02-28 NOTE — Discharge Planning (Signed)
02/28/2012  SW met with Merdis Delay in discharge planning group.  SW found Diana Henderson to have adequate transportation upon discharge with her sister.  Pt linked to [employer] [facility] and [facility].  SW provided contact resources for [employer] center and [employer] transportation.  Pt expressed ready to be discharged today.  SW will continue to assess for referrals.  Clarice Pole, LCASA 02/28/2012, 10:06 AM

## 2012-02-28 NOTE — Progress Notes (Signed)
Adult Services Patient-Family Contact/Session  Attendees:  Diana Henderson, patient's sister [phone]  Goal(s):  Discharge planning  Safety Concerns:  None   Narrative:  Called sister with plans to discharge patient today. She had some concerns that it was too soon for patient to be discharged, but admitted she had shown great improvement. She was more concerned with the fact that patient is going to have to go back out into the same situation as before. She is not able to work, drive, pay bills, etc. She doesn't have to pay rent currently because she is not working but still owes on utilities. The family has worked out that her daughter will stay with another sister in [place] for awhile to help reduce patient's stress.   They are counting on her money from school to help with the bills and getting caught up. She is working with a deadline of only missing 4 days of school and doesn't want to lose money or school. They think the letter that was given from here will help. Sister is worried that she may not be able to keep up with school since it is so soon. Counselor suggested that she get back in to meet the absence requirements and then maybe the school will work with her if she has additional problems.  Sister is worried about her having a lot of time alone at home but plans to check on her. Counselor informed her of outpatient follow-up and using this resource if she got concerned about patient. Sister thinks that this scared patient enough, she will ask for help. Also she knows that she could run the risk of losing her daughter if something else happened. Patient does not know the number to call for Medicaid transportation to get to appointments. This will be provided. Also informed her that our staff would reschedule appointment for her foot.  Barrier(s):none     Interventions: Support, information, discharge planning    Recommendation(s):  Outpatient follow up  Follow-up Required:   No  Explanation:    Veto Kemps 02/28/2012, 8:29 AM

## 2012-02-28 NOTE — Progress Notes (Signed)
[facility] Case Management Discharge Plan:  Will you be returning to the same living situation after discharge: Yes,    At discharge, do you have transportation home?:Yes,    Do you have the ability to pay for your medications:No.  Interagency Information:     Release of information consent forms completed and in the chart;  Patient's signature needed at discharge.  Patient to Follow up at:  Follow-up Information    Follow up with [facility]. On 03/06/2012. (Appt scheduled with Breck Coons, [facility] 10am.)    Contact information:   [address] [place], [place] [address] [phone] fax [phone]      Follow up with [facility] . On 03/14/2012. (Appt scheduled with Dr. Chevis Pretty at [facility].)    Contact information:   [address] [place], [place] [address] [phone] fax [phone]      Please follow up. (Please contact them 48 hours in advance to schedule transportation for medical appointments.  This company will request information regarding your medicaid.)    Contact information:   [employer] [phone]      Please follow up. (Please call to see if they can assist with bus passes and medication co-pays )    Contact information:   inter[facility] resource center [address] [place], [place] [address] [phone]        Follow up with [employer] .   Contact information:   Call 211 from your phone to ask about church's or agency that may be able to help with co-pay's and utility assistance.      Follow up with Dr. Leanor Kail. On 03/04/2012. (Appt at 2:45pm.)    Contact information:   [facility]    [address]  [place], [place] [address]  [phone] fax [phone]         Patient denies SI/HI:   Yes,       Safety Planning and Suicide Prevention discussed:  Yes,     Barrier to discharge identified:No.  Summary and Recommendations:  Patient was linked to the  interactive resource center to assist with possible transportation assistance to school via bus passes and assistance with medication co-pays.  Pt was linked to transportation services for medical appointments.       Clarice Pole C 02/28/2012, 1:05 PM

## 2012-02-28 NOTE — Progress Notes (Signed)
1:1 observation note:  Patient continues to interact well with staff and others.  She is pleasant and eager for discharge.  She has follow ups after discharge, hopefully, to help with her utilities.  Her mother and daughter will be picking her up.  Patient denies any SI/HI/AVH.  She will be getting samples of her medications to take home.  Continue to reassure patient and ensure safety on the unit; her behavior is appropriate.

## 2012-02-29 LAB — GLUCOSE, CAPILLARY: Glucose-Capillary: 108 mg/dL — ABNORMAL HIGH (ref 70–99)

## 2012-02-29 NOTE — Progress Notes (Signed)
Patient Discharge Instructions:  After Visit Summary (AVS):   Faxed to:  02/29/2012 Psychiatric Admission Assessment Note:   Faxed to:  02/29/2012 Suicide Risk Assessment - Discharge Assessment:   Faxed to:  02/29/2012 Faxed/Sent to the Next Level Care provider:  02/29/2012  Faxed to [facility] - Dr. Shela Commons and Breck Coons @ [phone] And to [facility] - Dr. Leanor Kail @ [phone]  Wandra Scot, 02/29/2012, 4:23 PM
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
## 2012-04-01 ENCOUNTER — Ambulatory Visit (INDEPENDENT_AMBULATORY_CARE_PROVIDER_SITE_OTHER): Payer: Medicaid Other | Admitting: *Deleted

## 2012-04-01 DIAGNOSIS — M7989 Other specified soft tissue disorders: Secondary | ICD-10-CM

## 2012-04-01 DIAGNOSIS — M79609 Pain in unspecified limb: Secondary | ICD-10-CM

## 2012-05-29 ENCOUNTER — Emergency Department (HOSPITAL_COMMUNITY)
Admission: EM | Admit: 2012-05-29 | Discharge: 2012-05-30 | Disposition: A | Discharge status: Home / Self Care | Payer: Medicaid Other | Attending: Emergency Medicine | Admitting: Emergency Medicine

## 2012-05-29 ENCOUNTER — Encounter (HOSPITAL_COMMUNITY): Payer: Self-pay | Admitting: Emergency Medicine

## 2012-05-29 DIAGNOSIS — R0609 Other forms of dyspnea: Secondary | ICD-10-CM | POA: Insufficient documentation

## 2012-05-29 DIAGNOSIS — E785 Hyperlipidemia, unspecified: Secondary | ICD-10-CM | POA: Insufficient documentation

## 2012-05-29 DIAGNOSIS — Z9889 Other specified postprocedural states: Secondary | ICD-10-CM | POA: Insufficient documentation

## 2012-05-29 DIAGNOSIS — R0600 Dyspnea, unspecified: Secondary | ICD-10-CM

## 2012-05-29 DIAGNOSIS — E119 Type 2 diabetes mellitus without complications: Secondary | ICD-10-CM | POA: Insufficient documentation

## 2012-05-29 DIAGNOSIS — J45909 Unspecified asthma, uncomplicated: Secondary | ICD-10-CM | POA: Insufficient documentation

## 2012-05-29 DIAGNOSIS — R0989 Other specified symptoms and signs involving the circulatory and respiratory systems: Secondary | ICD-10-CM | POA: Insufficient documentation

## 2012-05-29 DIAGNOSIS — I252 Old myocardial infarction: Secondary | ICD-10-CM | POA: Insufficient documentation

## 2012-05-29 DIAGNOSIS — I10 Essential (primary) hypertension: Secondary | ICD-10-CM | POA: Insufficient documentation

## 2012-05-29 HISTORY — DX: Hyperlipidemia, unspecified: E78.5

## 2012-05-29 HISTORY — DX: Unspecified asthma, uncomplicated: J45.909

## 2012-05-29 LAB — CBC WITH DIFFERENTIAL/PLATELET
Basophils Absolute: 0 10*3/uL (ref 0.0–0.1)
Eosinophils Absolute: 0 10*3/uL (ref 0.0–0.7)
Eosinophils Relative: 1 % (ref 0–5)
MCH: 27.7 pg (ref 26.0–34.0)
MCHC: 33 g/dL (ref 30.0–36.0)
MCV: 83.7 fL (ref 78.0–100.0)
Monocytes Absolute: 0.4 10*3/uL (ref 0.1–1.0)
Platelets: 181 10*3/uL (ref 150–400)
RDW: 13.7 % (ref 11.5–15.5)

## 2012-05-29 LAB — POCT I-STAT TROPONIN I: Troponin i, poc: 0.01 ng/mL (ref 0.00–0.08)

## 2012-05-29 MED ORDER — ASPIRIN 81 MG PO CHEW
324.0000 mg | CHEWABLE_TABLET | Freq: Once | ORAL | Status: AC
  Administered 2012-05-30: 324 mg via ORAL
  Filled 2012-05-29: qty 4

## 2012-05-29 NOTE — ED Notes (Signed)
Patient reports that for the past week, every time she has tried to lay down, she has to get up because of a pressure feeling in her chest/esphogeal area.  Patient also reports shortness of breath upon lying down. Cardiac history includes myocardial infarction and hypertension.

## 2012-05-29 NOTE — ED Provider Notes (Signed)
History     CSN: [id number]  Arrival date & time 05/29/12  2320   First MD Initiated Contact with Patient 05/29/12 2331      Chief Complaint  Patient presents with  . Chest Pain    (Consider location/radiation/quality/duration/timing/severity/associated sxs/prior treatment) Patient is a 41 y.o. female presenting with chest pain. The history is provided by the patient.  Chest Pain   She has been having dyspnea for the last 4 days. This is worse whenever she lays flat and better with sitting up. There's also a tight feeling across her chest which she rates at 10/10. There is no associated nausea or vomiting or diaphoresis. She denies cough or fever. Also, of note, she had surgery on her right foot 5 months ago and is still in a cast.  Past Medical History  Diagnosis Date  . Diabetes mellitus   . Hypertension   . Myocardial infarction   . Asthma   . Hyperlipidemia     Past Surgical History  Procedure Date  . Tubal ligation     History reviewed. No pertinent family history.  History  Substance Use Topics  . Smoking status: Never Smoker   . Smokeless tobacco: Not on file  . Alcohol Use: No    OB History    Grav Para Term Preterm Abortions TAB SAB Ect Mult Living                  Review of Systems  Cardiovascular: Positive for chest pain.  All other systems reviewed and are negative.    Allergies  Review of patient's allergies indicates no known allergies.  Home Medications   Current Outpatient Rx  Name  Route  Sig  Dispense  Refill  . ALBUTEROL SULFATE HFA 108 (90 BASE) MCG/ACT IN AERS   Inhalation   Inhale 2 puffs into the lungs every 4 (four) hours as needed for wheezing or shortness of breath.   18 g   0   . AMLODIPINE BESYLATE 10 MG PO TABS   Oral   Take 1 tablet (10 mg total) by mouth daily. For blood pressure control.   30 tablet   0   . DULOXETINE HCL 30 MG PO CPEP   Oral   Take 1 capsule (30 mg total) by mouth daily. For depression and  pain.   30 capsule   0   . GABAPENTIN 300 MG PO CAPS   Oral   Take 1 capsule (300 mg total) by mouth 3 (three) times daily at 8am, 2pm and bedtime. For anxiety, pain and mood stabilization.   90 capsule   0   . GLIPIZIDE ER 10 MG PO TB24   Oral   Take 1 tablet (10 mg total) by mouth daily with breakfast. For blood sugar control.   30 tablet   0   . METFORMIN HCL 500 MG PO TABS   Oral   Take 1 tablet (500 mg total) by mouth 2 (two) times daily with a meal. For blood sugar control.   60 tablet   0   . NABUMETONE 500 MG PO TABS   Oral   Take 1 tablet (500 mg total) by mouth 2 (two) times daily in the am and at bedtime.. For pain.   60 tablet   0   . RISPERIDONE 1 MG PO TABS   Oral   Take 1 tablet (1 mg total) by mouth at bedtime. For clarity of thoughts.   30 tablet   0  BP 143/90  Pulse 73  Temp 97.2 F (36.2 C) (Oral)  Resp 26  SpO2 100%  LMP 05/13/2012  Physical Exam  Nursing note and vitals reviewed. 41 year old female, resting comfortably and in no acute distress. Vital signs are significant for borderline hypertension with blood pressure 143/90, and tachypnea with respiratory rate of 26. Oxygen saturation is 100%, which is normal. Head is normocephalic and atraumatic. PERRLA, EOMI. Oropharynx is clear. Neck is nontender and supple without adenopathy or JVD. Back is nontender and there is no CVA tenderness. Lungs are clear without rales, wheezes, or rhonchi. Chest is nontender. Heart has regular rate and rhythm without murmur. Abdomen is soft, flat, nontender without masses or hepatosplenomegaly and peristalsis is normoactive. Extremities have 2+ edema. Short leg cast is present on the right. Skin is warm and dry without rash. Neurologic: Mental status is normal, cranial nerves are intact, there are no motor or sensory deficits.   ED Course  Procedures (including critical care time)  Results for orders placed during the hospital encounter of 05/29/12   CBC WITH DIFFERENTIAL      Component Value Range   WBC 5.5  4.0 - 10.5 K/uL   RBC 5.53 (*) 3.87 - 5.11 MIL/uL   Hemoglobin 15.3 (*) 12.0 - 15.0 g/dL   HCT 96.0 (*) 45.4 - 09.8 %   MCV 83.7  78.0 - 100.0 fL   MCH 27.7  26.0 - 34.0 pg   MCHC 33.0  30.0 - 36.0 g/dL   RDW 11.9  14.7 - 82.9 %   Platelets 181  150 - 400 K/uL   Neutrophils Relative 74  43 - 77 %   Neutro Abs 4.0  1.7 - 7.7 K/uL   Lymphocytes Relative 18  12 - 46 %   Lymphs Abs 1.0  0.7 - 4.0 K/uL   Monocytes Relative 7  3 - 12 %   Monocytes Absolute 0.4  0.1 - 1.0 K/uL   Eosinophils Relative 1  0 - 5 %   Eosinophils Absolute 0.0  0.0 - 0.7 K/uL   Basophils Relative 0  0 - 1 %   Basophils Absolute 0.0  0.0 - 0.1 K/uL  COMPREHENSIVE METABOLIC PANEL      Component Value Range   Sodium 135  135 - 145 mEq/L   Potassium 3.8  3.5 - 5.1 mEq/L   Chloride 96  96 - 112 mEq/L   CO2 24  19 - 32 mEq/L   Glucose, Bld 151 (*) 70 - 99 mg/dL   BUN 11  6 - 23 mg/dL   Creatinine, Ser 5.62  0.50 - 1.10 mg/dL   Calcium 9.7  8.4 - 13.0 mg/dL   Total Protein 8.0  6.0 - 8.3 g/dL   Albumin 4.1  3.5 - 5.2 g/dL   AST 34  0 - 37 U/L   ALT 29  0 - 35 U/L   Alkaline Phosphatase 86  39 - 117 U/L   Total Bilirubin 0.3  0.3 - 1.2 mg/dL   GFR calc non Af Amer 71 (*) >90 mL/min   GFR calc Af Amer 82 (*) >90 mL/min  PRO B NATRIURETIC PEPTIDE      Component Value Range   Pro B Natriuretic peptide (BNP) <5.0  0 - 125 pg/mL  D-DIMER, QUANTITATIVE      Component Value Range   D-Dimer, Quant 0.49 (*) 0.00 - 0.48 ug/mL-FEU  POCT I-STAT TROPONIN I      Component Value Range  Troponin i, poc 0.01  0.00 - 0.08 ng/mL   Comment 3            Dg Chest 2 View  05/30/2012  *RADIOLOGY REPORT*  Clinical Data: Chest pain and cough for 2 days.  CHEST - 2 VIEW  Comparison: 12/16/2011  Findings: Mild cardiac enlargement.  Pulmonary vascularity is normal.  No focal airspace consolidation.  No blunting of costophrenic angles.  No pneumothorax.  Mediastinal  contours are intact.  No significant change since the previous study.  IMPRESSION: Mild cardiac enlargement.  No evidence of active pulmonary disease.   Original Report Authenticated By: Burman Nieves, M.D.    Ct Angio Chest W/cm &/or Wo Cm  05/30/2012  *RADIOLOGY REPORT*  Clinical Data: Chest pressure and shortness of breath.  CT ANGIOGRAPHY CHEST  Technique:  Multidetector CT imaging of the chest using the standard protocol during bolus administration of intravenous contrast. Multiplanar reconstructed images including MIPs were obtained and reviewed to evaluate the vascular anatomy.  Contrast: OMNIPAQUE IOHEXOL 350 MG/ML SOLN  Comparison: None.  Findings: Technically limited study due to the patient's body habitus.  There is moderately reasonable opacification and visualization of the central and proximal segmental pulmonary arteries.  Distal segmental and peripheral vessels are not well visualized.  No filling defects demonstrated in the visualized central vessels.  No evidence of significant central pulmonary embolus.  Mild cardiac enlargement.  Normal caliber thoracic aorta.  No significant lymphadenopathy in the chest.  Esophagus is decompressed.  Mild enlargement of the thyroid gland diffusely suggesting goiter.  Visualization of the lungs is limited due to respiratory motion artifact but no gross consolidation or interstitial changes are appreciated.  No significant pleural effusion.  No pneumothorax.  Airways appear patent.  Degenerative changes in the thoracic spine.  IMPRESSION: Technically limited study.  No large central pulmonary emboli are visualized.   Original Report Authenticated By: Burman Nieves, M.D.       Date: 05/29/2012  Rate: 104  Rhythm: sinus tachycardia  QRS Axis: normal  Intervals: normal  ST/T Wave abnormalities: normal  Conduction Disutrbances:none  Narrative Interpretation: Sinus tachycardia, otherwise normal ECG. When compared with ECG of 02/21/2012, no  significant changes are seen.  Old EKG Reviewed: unchanged    1. Dyspnea       MDM  Acute dyspnea worrisome for CHF exacerbation. She also has a history asthma. Her foot is been immobilized in a cast for extended period of time which would put her risk for DVT and pulmonary embolism. Screening labs are ordered. Chest x-ray is ordered.  D-dimer is come back slightly elevated, but chest x-ray is unremarkable and BNP is very low indicating that she does not have any significant component of congestive heart failure. Although d-dimer is only minimally elevated, she is at increased risk for DVT, so CT angiogram will be obtained.  CT angiogram shows no evidence of pulmonary embolism and also no occult pneumonia. She will be given a therapeutic trial of albuterol to see if there is any improvement.  There is slight improvement with initial albuterol. A second possibility is that she may be having dyspnea secondary to anxiety. She'll be given a second albuterol treatment and also a dose of lorazepam.  She feels much better after the second treatment and the lorazepam. She is given a dose of dexamethasone in the ED and she is discharged with a prescription for lorazepam and is to followup with PCP in 3 days.  Dione Booze, MD 05/30/12 [phone]

## 2012-05-29 NOTE — ED Notes (Signed)
MD at bedside. 

## 2012-05-30 ENCOUNTER — Emergency Department (HOSPITAL_COMMUNITY): Payer: Medicaid Other

## 2012-05-30 ENCOUNTER — Encounter (HOSPITAL_COMMUNITY): Payer: Self-pay | Admitting: Radiology

## 2012-05-30 LAB — D-DIMER, QUANTITATIVE: D-Dimer, Quant: 0.49 ug/mL-FEU — ABNORMAL HIGH (ref 0.00–0.48)

## 2012-05-30 LAB — COMPREHENSIVE METABOLIC PANEL
ALT: 29 U/L (ref 0–35)
AST: 34 U/L (ref 0–37)
Calcium: 9.7 mg/dL (ref 8.4–10.5)
Creatinine, Ser: 0.97 mg/dL (ref 0.50–1.10)
GFR calc Af Amer: 82 mL/min — ABNORMAL LOW (ref >90)
Glucose, Bld: 151 mg/dL — ABNORMAL HIGH (ref 70–99)
Sodium: 135 mEq/L (ref 135–145)
Total Protein: 8 g/dL (ref 6.0–8.3)

## 2012-05-30 MED ORDER — ALBUTEROL SULFATE (5 MG/ML) 0.5% IN NEBU
2.5000 mg | INHALATION_SOLUTION | Freq: Once | RESPIRATORY_TRACT | Status: AC
  Administered 2012-05-30: 2.5 mg via RESPIRATORY_TRACT
  Filled 2012-05-30: qty 0.5

## 2012-05-30 MED ORDER — IOHEXOL 350 MG/ML SOLN
100.0000 mL | Freq: Once | INTRAVENOUS | Status: AC | PRN
  Administered 2012-05-30: 100 mL via INTRAVENOUS

## 2012-05-30 MED ORDER — LORAZEPAM 2 MG/ML IJ SOLN
1.0000 mg | Freq: Once | INTRAMUSCULAR | Status: AC
  Administered 2012-05-30: 1 mg via INTRAVENOUS
  Filled 2012-05-30: qty 1

## 2012-05-30 MED ORDER — LORAZEPAM 1 MG PO TABS: 1.0000 mg | ORAL_TABLET | Freq: Three times a day (TID) | ORAL | Status: DC | PRN

## 2012-05-30 MED ORDER — DEXAMETHASONE SODIUM PHOSPHATE 10 MG/ML IJ SOLN
10.0000 mg | Freq: Once | INTRAMUSCULAR | Status: AC
  Administered 2012-05-30: 10 mg via INTRAVENOUS
  Filled 2012-05-30: qty 1

## 2012-05-30 MED ORDER — IPRATROPIUM BROMIDE 0.02 % IN SOLN
0.5000 mg | Freq: Once | RESPIRATORY_TRACT | Status: AC
  Administered 2012-05-30: 0.5 mg via RESPIRATORY_TRACT
  Filled 2012-05-30: qty 2.5

## 2012-05-30 NOTE — ED Notes (Signed)
Patient transported to X-ray 

## 2012-05-30 NOTE — ED Notes (Signed)
The patient is AOx4 and comfortable with her discharge instructions. 

## 2012-05-30 NOTE — ED Notes (Signed)
Call pt. Diana Henderson to pick pt. Up when D/c [phone]

## 2012-06-02 ENCOUNTER — Encounter (HOSPITAL_COMMUNITY): Payer: Self-pay | Admitting: *Deleted

## 2012-06-02 ENCOUNTER — Emergency Department (HOSPITAL_COMMUNITY)
Admission: EM | Admit: 2012-06-02 | Discharge: 2012-06-02 | Disposition: A | Discharge status: Home / Self Care | Payer: Medicaid Other | Attending: Emergency Medicine | Admitting: Emergency Medicine

## 2012-06-02 DIAGNOSIS — E119 Type 2 diabetes mellitus without complications: Secondary | ICD-10-CM | POA: Insufficient documentation

## 2012-06-02 DIAGNOSIS — I252 Old myocardial infarction: Secondary | ICD-10-CM | POA: Insufficient documentation

## 2012-06-02 DIAGNOSIS — R110 Nausea: Secondary | ICD-10-CM | POA: Insufficient documentation

## 2012-06-02 DIAGNOSIS — Z791 Long term (current) use of non-steroidal anti-inflammatories (NSAID): Secondary | ICD-10-CM | POA: Insufficient documentation

## 2012-06-02 DIAGNOSIS — Z79899 Other long term (current) drug therapy: Secondary | ICD-10-CM | POA: Insufficient documentation

## 2012-06-02 DIAGNOSIS — I10 Essential (primary) hypertension: Secondary | ICD-10-CM | POA: Insufficient documentation

## 2012-06-02 DIAGNOSIS — H109 Unspecified conjunctivitis: Secondary | ICD-10-CM | POA: Insufficient documentation

## 2012-06-02 DIAGNOSIS — E785 Hyperlipidemia, unspecified: Secondary | ICD-10-CM | POA: Insufficient documentation

## 2012-06-02 DIAGNOSIS — R509 Fever, unspecified: Secondary | ICD-10-CM | POA: Insufficient documentation

## 2012-06-02 DIAGNOSIS — J45909 Unspecified asthma, uncomplicated: Secondary | ICD-10-CM | POA: Insufficient documentation

## 2012-06-02 LAB — CBC WITH DIFFERENTIAL/PLATELET
Eosinophils Relative: 0 % (ref 0–5)
HCT: 43.5 % (ref 36.0–46.0)
Hemoglobin: 13.9 g/dL (ref 12.0–15.0)
Lymphocytes Relative: 18 % (ref 12–46)
MCV: 84.5 fL (ref 78.0–100.0)
Monocytes Absolute: 1.1 10*3/uL — ABNORMAL HIGH (ref 0.1–1.0)
Monocytes Relative: 14 % — ABNORMAL HIGH (ref 3–12)
Neutro Abs: 5.1 10*3/uL (ref 1.7–7.7)
WBC: 7.5 10*3/uL (ref 4.0–10.5)

## 2012-06-02 LAB — BASIC METABOLIC PANEL
BUN: 8 mg/dL (ref 6–23)
CO2: 26 mEq/L (ref 19–32)
Chloride: 97 mEq/L (ref 96–112)
Creatinine, Ser: 0.99 mg/dL (ref 0.50–1.10)
Glucose, Bld: 142 mg/dL — ABNORMAL HIGH (ref 70–99)

## 2012-06-02 MED ORDER — ACETAMINOPHEN 325 MG PO TABS
650.0000 mg | ORAL_TABLET | Freq: Once | ORAL | Status: AC
  Administered 2012-06-02: 650 mg via ORAL
  Filled 2012-06-02: qty 2

## 2012-06-02 MED ORDER — KETOROLAC TROMETHAMINE 30 MG/ML IJ SOLN
30.0000 mg | Freq: Once | INTRAMUSCULAR | Status: AC
  Administered 2012-06-02: 30 mg via INTRAVENOUS
  Filled 2012-06-02: qty 1

## 2012-06-02 MED ORDER — ACETAMINOPHEN 500 MG PO TABS: 1000.0000 mg | ORAL_TABLET | Freq: Once | ORAL | Status: DC

## 2012-06-02 MED ORDER — ERYTHROMYCIN 5 MG/GM OP OINT
1.0000 "application " | TOPICAL_OINTMENT | Freq: Once | OPHTHALMIC | Status: AC
  Administered 2012-06-02: 1 via OPHTHALMIC
  Filled 2012-06-02: qty 1

## 2012-06-02 MED ORDER — SODIUM CHLORIDE 0.9 % IV BOLUS (SEPSIS)
1000.0000 mL | Freq: Once | INTRAVENOUS | Status: AC
  Administered 2012-06-02: 1000 mL via INTRAVENOUS

## 2012-06-02 MED ORDER — ONDANSETRON HCL 4 MG/2ML IJ SOLN
4.0000 mg | Freq: Once | INTRAMUSCULAR | Status: AC
  Administered 2012-06-02: 4 mg via INTRAVENOUS
  Filled 2012-06-02: qty 2

## 2012-06-02 NOTE — ED Notes (Signed)
Pt is here with swollen eyes times three days, fever, and headache.  Pt complains of all over body aches

## 2012-06-02 NOTE — ED Notes (Signed)
Warm compresses given for eyes per Dr.

## 2012-06-02 NOTE — ED Notes (Signed)
AIDET performed. 

## 2012-06-02 NOTE — ED Notes (Signed)
Assisted pt to restroom, full assistance needed

## 2012-06-02 NOTE — ED Provider Notes (Addendum)
History     CSN: [id number]  Arrival date & time 06/02/12  1224   First MD Initiated Contact with Patient 06/02/12 1819      Chief Complaint  Patient presents with  . swollen eyes   . Fever    HPI  The patient presents with concerns of ongoing generalized discomfort, and new bilateral eye discharge.  She was seen here 2 days ago after her symptoms began.  Since that time she notes that she continues to generally unwell, with continued subjective fever, chills, headache, as well as diffuse myalgia.  She complains specifically of new bilateral discharge, that prohibits her from opening her eyes.  There is associated irritation, swelling about the eyelids.  Symptoms are worse in the morning.  She has not achieved significant relief with anything.   Past Medical History  Diagnosis Date  . Diabetes mellitus   . Hypertension   . Myocardial infarction   . Asthma   . Hyperlipidemia     Past Surgical History  Procedure Date  . Tubal ligation     No family history on file.  History  Substance Use Topics  . Smoking status: Never Smoker   . Smokeless tobacco: Not on file  . Alcohol Use: No    OB History    Grav Para Term Preterm Abortions TAB SAB Ect Mult Living                  Review of Systems  Constitutional:       Per HPI, otherwise negative  HENT:       Per HPI, otherwise negative  Eyes: Negative.   Respiratory:       Per HPI, otherwise negative  Cardiovascular:       Per HPI, otherwise negative  Gastrointestinal: Positive for nausea. Negative for vomiting.  Genitourinary: Negative.   Musculoskeletal:       Per HPI, otherwise negative  Skin: Negative.   Neurological: Negative for syncope.    Allergies  Review of patient's allergies indicates no known allergies.  Home Medications   Current Outpatient Rx  Name  Route  Sig  Dispense  Refill  . ALBUTEROL SULFATE HFA 108 (90 BASE) MCG/ACT IN AERS   Inhalation   Inhale 2 puffs into the lungs every 4  (four) hours as needed for wheezing or shortness of breath.   18 g   0   . AMLODIPINE BESYLATE 10 MG PO TABS   Oral   Take 1 tablet (10 mg total) by mouth daily. For blood pressure control.   30 tablet   0   . CYCLOBENZAPRINE HCL 10 MG PO TABS   Oral   Take 10 mg by mouth 3 (three) times daily as needed.         . DULOXETINE HCL 30 MG PO CPEP   Oral   Take 1 capsule (30 mg total) by mouth daily. For depression and pain.   30 capsule   0   . GABAPENTIN 300 MG PO CAPS   Oral   Take 1 capsule (300 mg total) by mouth 3 (three) times daily at 8am, 2pm and bedtime. For anxiety, pain and mood stabilization.   90 capsule   0   . GLIPIZIDE ER 10 MG PO TB24   Oral   Take 1 tablet (10 mg total) by mouth daily with breakfast. For blood sugar control.   30 tablet   0   . HYDROXYZINE HCL 50 MG PO TABS  Oral   Take 50 mg by mouth 3 (three) times daily as needed.         . IBUPROFEN 200 MG PO TABS   Oral   Take 200 mg by mouth every 6 (six) hours as needed. For pain         . LORAZEPAM 1 MG PO TABS   Oral   Take 1 tablet (1 mg total) by mouth 3 (three) times daily as needed for anxiety.   15 tablet   0   . METFORMIN HCL 500 MG PO TABS   Oral   Take 1 tablet (500 mg total) by mouth 2 (two) times daily with a meal. For blood sugar control.   60 tablet   0   . RISPERIDONE 1 MG PO TABS   Oral   Take 1 tablet (1 mg total) by mouth at bedtime. For clarity of thoughts.   30 tablet   0     BP 140/75  Pulse 103  Temp 99.9 F (37.7 C) (Oral)  Resp 20  SpO2 97%  LMP 05/13/2012  Physical Exam  Nursing note and vitals reviewed. Constitutional: She is oriented to person, place, and time. She appears well-developed and well-nourished.       Large F resting in bed  HENT:  Head: Normocephalic and atraumatic.  Mouth/Throat: Oropharynx is clear and moist.  Eyes:       Bilateral discharge w edematous lids - eyes are injected.  Acuity is appropriate  Neck: No tracheal  deviation present.  Cardiovascular: Regular rhythm.  Tachycardia present.   Pulmonary/Chest: No stridor. Tachypnea noted.  Abdominal: Soft. Normal appearance.  Musculoskeletal: She exhibits no edema and no tenderness.       Feet:  Neurological: She is alert and oriented to person, place, and time. No cranial nerve deficit. She exhibits normal muscle tone. Coordination normal.  Skin: Skin is warm and dry.  Psychiatric: She has a normal mood and affect. Her speech is delayed. She is slowed and withdrawn. Cognition and memory are impaired.    ED Course  Procedures (including critical care time)  Labs Reviewed  CBC WITH DIFFERENTIAL - Abnormal; Notable for the following:    RBC 5.15 (*)     Monocytes Relative 14 (*)     Monocytes Absolute 1.1 (*)     All other components within normal limits  BASIC METABOLIC PANEL - Abnormal; Notable for the following:    Sodium 134 (*)     Glucose, Bld 142 (*)     GFR calc non Af Amer 69 (*)     GFR calc Af Amer 80 (*)     All other components within normal limits   No results found.   No diagnosis found.  O2- 99%ra, normal  I reviewed the patient's chart from 2 days ago, including labs, CT scan results.  9:10 PM  Patient sleeping, awakens easily Patient can open eyes- though minimally  MDM  This 41 year old female presents with concerns of new conjunctivitis, complicating ongoing generalized complaints, elbows most consistent with viral syndrome.  On exam the patient is in no distress.  She has unremarkable vital signs, unremarkable laboratory evaluation.  Following provision of topical antibiotics, warm compresses, the patient was able to arise.  This conjunctivitis is a notable change from a presentation several days ago.  She was counseled on the need for outpatient followup, continued ophthalmologic treatment, and given her sleeping status on repeat check, the absence of distress, the aforementioned reassuring  vital signs and labs, she is  appropriate for discharge with outpatient management.        Gerhard Munch, MD 06/02/12 2112  Gerhard Munch, MD 06/02/12 2112

## 2012-07-17 HISTORY — PX: OTHER SURGICAL HISTORY: SHX169

## 2012-07-17 HISTORY — PX: MIDTARSAL ARTHRODESIS: SHX2034

## 2012-09-03 ENCOUNTER — Encounter: Payer: Self-pay | Admitting: Podiatry

## 2012-09-04 DIAGNOSIS — M24576 Contracture, unspecified foot: Secondary | ICD-10-CM

## 2012-09-04 DIAGNOSIS — M24573 Contracture, unspecified ankle: Secondary | ICD-10-CM

## 2012-09-04 HISTORY — PX: GASTROC RECESSION EXTREMITY: SHX6262

## 2012-09-08 ENCOUNTER — Encounter: Payer: Self-pay | Admitting: *Deleted

## 2012-09-09 ENCOUNTER — Ambulatory Visit (INDEPENDENT_AMBULATORY_CARE_PROVIDER_SITE_OTHER): Payer: Medicaid Other | Admitting: Podiatry

## 2012-09-09 ENCOUNTER — Encounter: Payer: Self-pay | Admitting: Podiatry

## 2012-09-09 VITALS — BP 153/113 | HR 77 | Temp 98.1°F | Ht 66.0 in | Wt 289.0 lb

## 2012-09-09 DIAGNOSIS — Z9889 Other specified postprocedural states: Secondary | ICD-10-CM | POA: Insufficient documentation

## 2012-09-09 MED ORDER — HYDROCODONE-ACETAMINOPHEN 7.5-300 MG PO TABS: 325.0000 mg | ORAL_TABLET | ORAL | Status: DC | PRN

## 2012-09-09 NOTE — Patient Instructions (Addendum)
Seen for follow up on left foot surgery. Due to tightness and pain on calf and slow healing following bone surgery on left foot, we extended part of Achilles tendon and place the leg in Cast. It will be total 4 weeks in cast.  Due to unresolved foot pain and associated edema on lower limbs on both legs, we do not expect you will be able to return to work at this time.  Please advise the Disability people that you will not be able to return to work till further notice. Return in 3 weeks.

## 2012-09-09 NOTE — Progress Notes (Signed)
Post-op: Patient presents walking in casted foot assisted with a cane. Patient here for 5 days post right Gastrocnemius Recession left, (01/10/12 initial fusion of the first mCJ, and procedure repeated on  Left 07/17/12 for failed fusion). Stated that back of the leg (surgery site) is sore.  Pain is controlled with current analgesics. Medications being used: Vicodin 7.5mg . The patient denies any fever or chil, denies calf pain other than tenderness at incision site. She noted her left foot is getting more painful with some swelling in leg, since she is rely on the left side more. Patient wears ankle brace on left with tennis shoes.   Objective: Cast is in good shape. All digits are normal color and able to wiggle.  Noted mild pitting edema bilaterally at the level of anterior shin about 5 cm below knee cap.    Assessment: Post surgical foot under cast. Shown no complication with surgical area.  Pitting edema(1+) at anterior shin area is bilateral.   Plan: Return in 3 weeks to remove cast. Discussed about return work date.  Statement given stating that patient has not recovered from the original surgery and failed to improve from the surgery as to this date. At this time she is not able to return to work. According to her progress, it is not possible to determine her return to work date.

## 2012-09-30 ENCOUNTER — Ambulatory Visit (INDEPENDENT_AMBULATORY_CARE_PROVIDER_SITE_OTHER): Payer: Medicaid Other | Admitting: Podiatry

## 2012-09-30 ENCOUNTER — Encounter: Payer: Self-pay | Admitting: Podiatry

## 2012-09-30 VITALS — BP 153/90 | HR 72 | Ht 66.0 in | Wt 289.0 lb

## 2012-09-30 DIAGNOSIS — M21961 Unspecified acquired deformity of right lower leg: Secondary | ICD-10-CM

## 2012-09-30 DIAGNOSIS — M24576 Contracture, unspecified foot: Secondary | ICD-10-CM

## 2012-09-30 DIAGNOSIS — M21969 Unspecified acquired deformity of unspecified lower leg: Secondary | ICD-10-CM

## 2012-09-30 MED ORDER — HYDROCODONE-ACETAMINOPHEN 7.5-300 MG PO TABS: 325.0000 mg | ORAL_TABLET | ORAL | Status: DC | PRN

## 2012-09-30 NOTE — Progress Notes (Signed)
Subjective: Status post 10 weeks osseous foot surgery, third post op visit. Patient is ambulatory in BK cast. Denies having fever or chill. Pain is taking medication as prescribed. Patient stated that her right leg and back has been hurting. She was coping with pain medication. At this time she was referred to pain management center for her back pain.   Objective:  Cast removed. All wound healed well. No significant edema noted in right foot.  Denies pain upon ambulation.   Radiographic examination of the right limb show internal fixation devices across the first MCJ fusion site. Hard wares are in place. Bone to bone contact is in good approximation. First ray is in dorsiflexed position. Positive of trabecular patterns going across the fusion site.  No sign of bone resorption, no sign of gapping across the fusion site.  Impressions: Positive evidence of bone healing across the fusion site.   Assessment:  Satisfactory osseous healing right fusion site. Ankle equinus is corrected with Gastrocnemius Recession procedure.   Plan: Ambulate in CAM walker until leg muscle strength is returned. Will check back in 3 weeks.

## 2012-09-30 NOTE — Patient Instructions (Addendum)
Right bone surgery has been for 10 weeks now.  Try to ambulate in CAM walker as needed. May try regular tennis shoes as tolerated. Return in 3 weeks.

## 2012-10-21 ENCOUNTER — Ambulatory Visit (INDEPENDENT_AMBULATORY_CARE_PROVIDER_SITE_OTHER): Payer: Medicaid Other | Admitting: Podiatry

## 2012-10-21 DIAGNOSIS — M25579 Pain in unspecified ankle and joints of unspecified foot: Secondary | ICD-10-CM

## 2012-10-21 MED ORDER — OXYCODONE-ACETAMINOPHEN 10-325 MG PO TABS: 1.0000 | ORAL_TABLET | Freq: Four times a day (QID) | ORAL | Status: DC | PRN

## 2012-10-21 NOTE — Progress Notes (Signed)
S: 7 week post Lapidus fusion with plate and screws.  Patient is wearing CAM walker for ambulation for the past 2 weeks.  Patient also uses wheel chair at home to get around. Patient stated that the surgical site hurts on right foot.  Stated that she used Vicodin for pain and she has been out of the pain medication for several days.  O: No visible edema or erythema.  Pain elicited upon manipulation of forefoot or rearfoot right.  Last X-ray findings were consistent with the surgery without any displacement. No bone callus observed.  No complete consolidation at fusion site noted.  A: Status post fusion first MCJ healing in progress with pain upon weight bearing.  P: Reviewed the findings. 7th week is still too early for the bones to consolidate. Placed in cast to help immobilize and promote healing of the fusion site. Patient cooperated well. Instructed to take weight off of the foot and use wheel chair as much as possible.  Return in 3 weeks unless there is any other problem.

## 2012-11-03 ENCOUNTER — Encounter (HOSPITAL_COMMUNITY): Payer: Self-pay | Admitting: Emergency Medicine

## 2012-11-03 ENCOUNTER — Emergency Department (HOSPITAL_COMMUNITY): Payer: Medicaid Other

## 2012-11-03 ENCOUNTER — Emergency Department (HOSPITAL_COMMUNITY)
Admission: EM | Admit: 2012-11-03 | Discharge: 2012-11-03 | Disposition: A | Discharge status: Home / Self Care | Payer: Medicaid Other | Attending: Emergency Medicine | Admitting: Emergency Medicine

## 2012-11-03 DIAGNOSIS — R6889 Other general symptoms and signs: Secondary | ICD-10-CM | POA: Insufficient documentation

## 2012-11-03 DIAGNOSIS — Z79899 Other long term (current) drug therapy: Secondary | ICD-10-CM | POA: Insufficient documentation

## 2012-11-03 DIAGNOSIS — M25579 Pain in unspecified ankle and joints of unspecified foot: Secondary | ICD-10-CM | POA: Insufficient documentation

## 2012-11-03 DIAGNOSIS — R059 Cough, unspecified: Secondary | ICD-10-CM | POA: Insufficient documentation

## 2012-11-03 DIAGNOSIS — I10 Essential (primary) hypertension: Secondary | ICD-10-CM | POA: Insufficient documentation

## 2012-11-03 DIAGNOSIS — Z9889 Other specified postprocedural states: Secondary | ICD-10-CM | POA: Insufficient documentation

## 2012-11-03 DIAGNOSIS — R05 Cough: Secondary | ICD-10-CM | POA: Insufficient documentation

## 2012-11-03 DIAGNOSIS — I252 Old myocardial infarction: Secondary | ICD-10-CM | POA: Insufficient documentation

## 2012-11-03 DIAGNOSIS — Z8639 Personal history of other endocrine, nutritional and metabolic disease: Secondary | ICD-10-CM | POA: Insufficient documentation

## 2012-11-03 DIAGNOSIS — R0600 Dyspnea, unspecified: Secondary | ICD-10-CM

## 2012-11-03 DIAGNOSIS — Z862 Personal history of diseases of the blood and blood-forming organs and certain disorders involving the immune mechanism: Secondary | ICD-10-CM | POA: Insufficient documentation

## 2012-11-03 DIAGNOSIS — M25569 Pain in unspecified knee: Secondary | ICD-10-CM | POA: Insufficient documentation

## 2012-11-03 DIAGNOSIS — J45901 Unspecified asthma with (acute) exacerbation: Secondary | ICD-10-CM | POA: Insufficient documentation

## 2012-11-03 DIAGNOSIS — R0989 Other specified symptoms and signs involving the circulatory and respiratory systems: Secondary | ICD-10-CM | POA: Insufficient documentation

## 2012-11-03 DIAGNOSIS — E119 Type 2 diabetes mellitus without complications: Secondary | ICD-10-CM | POA: Insufficient documentation

## 2012-11-03 DIAGNOSIS — R0609 Other forms of dyspnea: Secondary | ICD-10-CM | POA: Insufficient documentation

## 2012-11-03 LAB — POCT I-STAT, CHEM 8
BUN: 9 mg/dL (ref 6–23)
Calcium, Ion: 0.98 mmol/L — ABNORMAL LOW (ref 1.12–1.23)
Chloride: 106 mEq/L (ref 96–112)
Creatinine, Ser: 0.9 mg/dL (ref 0.50–1.10)
Glucose, Bld: 194 mg/dL — ABNORMAL HIGH (ref 70–99)

## 2012-11-03 LAB — POCT I-STAT TROPONIN I: Troponin i, poc: 0 ng/mL (ref 0.00–0.08)

## 2012-11-03 MED ORDER — ALBUTEROL SULFATE (5 MG/ML) 0.5% IN NEBU
2.5000 mg | INHALATION_SOLUTION | Freq: Once | RESPIRATORY_TRACT | Status: AC
  Administered 2012-11-03: 2.5 mg via RESPIRATORY_TRACT
  Filled 2012-11-03: qty 0.5

## 2012-11-03 MED ORDER — SODIUM CHLORIDE 0.9 % IN NEBU
INHALATION_SOLUTION | RESPIRATORY_TRACT | Status: AC
  Filled 2012-11-03: qty 3

## 2012-11-03 NOTE — ED Notes (Signed)
PER EMS- pt picked up from rehab facility but stays at home with c/o SOB,  Pt has hx of asthma.  EMS reports lung sounds clear and labored breathing.  Pt reports taking 2 puffs of albuterol inhaler at 10a today, with relief.  Pt is alert and oriented and in no distress.

## 2012-11-03 NOTE — ED Notes (Signed)
[id number]<AV> Expected date:<BR> Expected time:<BR> Means of arrival:<BR> Comments:<BR> Hold for hall A

## 2012-11-03 NOTE — ED Provider Notes (Signed)
History     CSN: [id number]  Arrival date & time 11/03/12  1412   First MD Initiated Contact with Patient 11/03/12 1456      Chief Complaint  Patient presents with  . Shortness of Breath    (Consider location/radiation/quality/duration/timing/severity/associated sxs/prior treatment) HPI Comments: Patient presents with shortness of breath. She states that she frequently has shortness of breath but it was worse today. She's short of breath even at rest doesn't seem to be worse on exertion. She occasionally has some pain in the Center of her chest. It's worse with inspiration. She denies any other pleuritic-type pain. She denies any leg swelling. She has some pain in her left knee which she says is ongoing for several months. She also has some pain in her right foot. She had recent surgery on the right foot by a podiatrist and currently has a cast on her right lower leg. She does have a history of asthma and she's not sure if this is her asthma flaring up. She has a cough which is mostly dry. She denies he fevers or chills. She does have some sneezing and feels that she has allergies. She's using her albuterol inhaler at home without relief. She's not any other medications for asthma or allergic rhinitis.  Patient is a 42 y.o. female presenting with shortness of breath.  Shortness of Breath Associated symptoms: no abdominal pain, no chest pain, no cough, no diaphoresis, no fever, no headaches, no rash and no vomiting     Past Medical History  Diagnosis Date  . Diabetes mellitus   . Hypertension   . Myocardial infarction   . Asthma   . Hyperlipidemia     Past Surgical History  Procedure Laterality Date  . Tubal ligation    . Midtarsal arthrodesis Right 07/17/12  . Remove implant deep Right 07/17/12  . Lapidus fusion Right 01/10/12  . Carpal tunnel release Left 2001    Left wrist  . Gastroc recession extremity Right 3//20/14    Right foot    No family history on file.  History   Substance Use Topics  . Smoking status: Never Smoker   . Smokeless tobacco: Never Used  . Alcohol Use: No    OB History   Grav Para Term Preterm Abortions TAB SAB Ect Mult Living                  Review of Systems  Constitutional: Negative for fever, chills, diaphoresis and fatigue.  HENT: Negative for congestion, rhinorrhea and sneezing.   Eyes: Negative.   Respiratory: Positive for shortness of breath. Negative for cough and chest tightness.   Cardiovascular: Negative for chest pain and leg swelling.  Gastrointestinal: Negative for nausea, vomiting, abdominal pain, diarrhea and blood in stool.  Genitourinary: Negative for frequency, hematuria, flank pain and difficulty urinating.  Musculoskeletal: Positive for myalgias (to lower legs), joint swelling and arthralgias. Negative for back pain.  Skin: Negative for rash.  Neurological: Negative for dizziness, speech difficulty, weakness, numbness and headaches.    Allergies  Review of patient's allergies indicates no known allergies.  Home Medications   Current Outpatient Rx  Name  Route  Sig  Dispense  Refill  . albuterol (PROVENTIL HFA;VENTOLIN HFA) 108 (90 BASE) MCG/ACT inhaler   Inhalation   Inhale 2 puffs into the lungs every 4 (four) hours as needed for wheezing or shortness of breath.   18 g   0   . amLODipine (NORVASC) 10 MG tablet   Oral  Take 1 tablet (10 mg total) by mouth daily. For blood pressure control.   30 tablet   0   . cyclobenzaprine (FLEXERIL) 10 MG tablet   Oral   Take 10 mg by mouth 3 (three) times daily as needed.         . DULoxetine (CYMBALTA) 30 MG capsule   Oral   Take 1 capsule (30 mg total) by mouth daily. For depression and pain.   30 capsule   0   . gabapentin (NEURONTIN) 300 MG capsule   Oral   Take 1 capsule (300 mg total) by mouth 3 (three) times daily at 8am, 2pm and bedtime. For anxiety, pain and mood stabilization.   90 capsule   0   . glipiZIDE (GLUCOTROL XL) 10 MG  24 hr tablet   Oral   Take 1 tablet (10 mg total) by mouth daily with breakfast. For blood sugar control.   30 tablet   0   . hydrOXYzine (ATARAX/VISTARIL) 50 MG tablet   Oral   Take 50 mg by mouth 3 (three) times daily as needed.         Marland Kitchen ibuprofen (ADVIL,MOTRIN) 200 MG tablet   Oral   Take 200 mg by mouth every 6 (six) hours as needed. For pain         . LORazepam (ATIVAN) 1 MG tablet   Oral   Take 1 tablet (1 mg total) by mouth 3 (three) times daily as needed for anxiety.   15 tablet   0   . metFORMIN (GLUCOPHAGE) 500 MG tablet   Oral   Take 1 tablet (500 mg total) by mouth 2 (two) times daily with a meal. For blood sugar control.   60 tablet   0   . oxyCODONE-acetaminophen (PERCOCET) 10-325 MG per tablet   Oral   Take 1 tablet by mouth every 6 (six) hours as needed for pain.   90 tablet   0   . risperiDONE (RISPERDAL) 1 MG tablet   Oral   Take 1 mg by mouth at bedtime.           BP 141/87  Pulse 73  Temp(Src) 97.7 F (36.5 C)  Resp 24  SpO2 100%  LMP 10/29/2012  Physical Exam  Constitutional: She is oriented to person, place, and time. She appears well-developed and well-nourished.  HENT:  Head: Normocephalic and atraumatic.  Mouth/Throat: Oropharynx is clear and moist.  Eyes: Pupils are equal, round, and reactive to light.  Neck: Normal range of motion. Neck supple.  Cardiovascular: Normal rate, regular rhythm and normal heart sounds.   Pulmonary/Chest: Effort normal and breath sounds normal. No respiratory distress. She has no wheezes. She has no rales. She exhibits tenderness (Reproducible tenderness along the sternum).  Abdominal: Soft. Bowel sounds are normal. There is no tenderness. There is no rebound and no guarding.  Musculoskeletal: Normal range of motion. She exhibits no edema.  Pt has a lower leg cast to right leg.  TTP around left knee, no noticeable unilateral swelling to legs.  Generalized TTP along both legs diffusely   Lymphadenopathy:    She has no cervical adenopathy.  Neurological: She is alert and oriented to person, place, and time.  Skin: Skin is warm and dry. No rash noted.  Psychiatric: She has a normal mood and affect.    ED Course  Procedures (including critical care time)  Results for orders placed during the hospital encounter of 11/03/12  D-DIMER, QUANTITATIVE  Result Value Range   D-Dimer, Quant <0.27  0.00 - 0.48 ug/mL-FEU   Dg Chest 2 View  11/03/2012   *RADIOLOGY REPORT*  Clinical Data: Shortness of breath  CHEST - 2 VIEW  Comparison: May 30, 2012.  Findings: Stable mild cardiomegaly.  No acute pulmonary disease is noted.  Bony thorax is intact.  IMPRESSION: No acute cardiopulmonary abnormality seen.   Original Report Authenticated By: Lupita Raider.,  M.D.     Dg Chest 2 View  11/03/2012   *RADIOLOGY REPORT*  Clinical Data: Shortness of breath  CHEST - 2 VIEW  Comparison: May 30, 2012.  Findings: Stable mild cardiomegaly.  No acute pulmonary disease is noted.  Bony thorax is intact.  IMPRESSION: No acute cardiopulmonary abnormality seen.   Original Report Authenticated By: Lupita Raider.,  M.D.     Date: 11/03/2012  Rate: 71  Rhythm: normal sinus rhythm  QRS Axis: normal  Intervals: normal  ST/T Wave abnormalities: normal  Conduction Disutrbances:none  Narrative Interpretation:   Old EKG Reviewed: unchanged   1. Dyspnea       MDM  Patient presents with shortness of breath. She has normal oxygen saturations, has no tachypnea, tachycardia or other signs of respiratory distress. Her lung sounds are clear with no wheezing. She has no symptoms suggestive of acute coronary syndrome. She has a negative d-dimer and has no other suggestions of pulmonary embolus. I advised her to continue using her albuterol inhaler at home and to followup with her primary care physician within the next 2 days.        Rolan Bucco, MD 11/03/12 [phone]

## 2012-11-04 ENCOUNTER — Ambulatory Visit (INDEPENDENT_AMBULATORY_CARE_PROVIDER_SITE_OTHER): Payer: Medicaid Other | Admitting: Podiatry

## 2012-11-04 VITALS — BP 159/97 | HR 78

## 2012-11-04 DIAGNOSIS — M25579 Pain in unspecified ankle and joints of unspecified foot: Secondary | ICD-10-CM

## 2012-11-04 DIAGNOSIS — M25571 Pain in right ankle and joints of right foot: Secondary | ICD-10-CM

## 2012-11-04 DIAGNOSIS — M79661 Pain in right lower leg: Secondary | ICD-10-CM

## 2012-11-04 DIAGNOSIS — Z9889 Other specified postprocedural states: Secondary | ICD-10-CM

## 2012-11-04 DIAGNOSIS — R609 Edema, unspecified: Secondary | ICD-10-CM

## 2012-11-04 DIAGNOSIS — M216X9 Other acquired deformities of unspecified foot: Secondary | ICD-10-CM

## 2012-11-04 DIAGNOSIS — M21961 Unspecified acquired deformity of right lower leg: Secondary | ICD-10-CM

## 2012-11-04 MED ORDER — OXYCODONE-ACETAMINOPHEN 10-325 MG PO TABS: 1.0000 | ORAL_TABLET | Freq: Three times a day (TID) | ORAL | Status: DC | PRN

## 2012-11-04 NOTE — Progress Notes (Signed)
History of present illness:  42 year old female presents for post op care. Patient is walking with a cane wearing below knee cast on right lower limb.  Stated that the Zenaida Niece did not have access to wheel chair.  This is 8 weeks since the fusion of the first Metatarsocuneiform joint and Gastrocnemius Recession was done on right lower limb.  Stated that she had been to ER yesterday because of excess foot pain. Patient complained of pain on back of both legs and right foot at surgical site. Stated that pain is bad and have difficulty sleeping. When patient complained of left leg pain during her last visit, it was thought to be due to excess weight bearing in order to favor the right limb. The pain on right calf since the surgery was thought to be due to tendon lengthening surgery.  Objective:  Cast was removed. Surgical wound is well headed. No associated edema or erythema noted. Positive of bilateral calf pain when squeezed.  Calf is felt tight with questionable swelling. Noted of no increased heat at the calf area.  Radiographic examination of the right foot show incomplete bone healing at the fusion site. Possible loosening of fixation screws at proximal end of plat hole.  Assessment: R/O DVT. Slow bone healing at fusion site, 1st Metatarsocuneiform joint.  Excess post op pain right foot.  Plan:  Referral to vascular specialty to rule out DVT. Minimum weight bearing on right lower limb. Right foot placed in soft cast and Air CAM walker. Instructed how to use the air pump. Patient was instructed to use Wheel chair whenever possible to avoid weight bearing. Return in 2 weeks. If fail to improve by 3rd month, will try bone stimulator.

## 2012-11-12 ENCOUNTER — Emergency Department (HOSPITAL_COMMUNITY): Payer: Medicaid Other

## 2012-11-12 ENCOUNTER — Encounter (HOSPITAL_COMMUNITY): Payer: Self-pay | Admitting: Emergency Medicine

## 2012-11-12 ENCOUNTER — Emergency Department (HOSPITAL_COMMUNITY)
Admission: EM | Admit: 2012-11-12 | Discharge: 2012-11-13 | Disposition: A | Discharge status: Home / Self Care | Payer: Medicaid Other | Attending: Emergency Medicine | Admitting: Emergency Medicine

## 2012-11-12 DIAGNOSIS — I252 Old myocardial infarction: Secondary | ICD-10-CM | POA: Insufficient documentation

## 2012-11-12 DIAGNOSIS — S46919A Strain of unspecified muscle, fascia and tendon at shoulder and upper arm level, unspecified arm, initial encounter: Secondary | ICD-10-CM

## 2012-11-12 DIAGNOSIS — S52121A Displaced fracture of head of right radius, initial encounter for closed fracture: Secondary | ICD-10-CM

## 2012-11-12 DIAGNOSIS — W19XXXA Unspecified fall, initial encounter: Secondary | ICD-10-CM

## 2012-11-12 DIAGNOSIS — Y929 Unspecified place or not applicable: Secondary | ICD-10-CM | POA: Insufficient documentation

## 2012-11-12 DIAGNOSIS — Z91040 Latex allergy status: Secondary | ICD-10-CM | POA: Insufficient documentation

## 2012-11-12 DIAGNOSIS — S8000XA Contusion of unspecified knee, initial encounter: Secondary | ICD-10-CM

## 2012-11-12 DIAGNOSIS — Y9389 Activity, other specified: Secondary | ICD-10-CM | POA: Insufficient documentation

## 2012-11-12 DIAGNOSIS — S93409A Sprain of unspecified ligament of unspecified ankle, initial encounter: Secondary | ICD-10-CM

## 2012-11-12 DIAGNOSIS — S52123A Displaced fracture of head of unspecified radius, initial encounter for closed fracture: Secondary | ICD-10-CM | POA: Insufficient documentation

## 2012-11-12 DIAGNOSIS — I10 Essential (primary) hypertension: Secondary | ICD-10-CM | POA: Insufficient documentation

## 2012-11-12 DIAGNOSIS — IMO0002 Reserved for concepts with insufficient information to code with codable children: Secondary | ICD-10-CM | POA: Insufficient documentation

## 2012-11-12 DIAGNOSIS — W010XXA Fall on same level from slipping, tripping and stumbling without subsequent striking against object, initial encounter: Secondary | ICD-10-CM | POA: Insufficient documentation

## 2012-11-12 DIAGNOSIS — J45909 Unspecified asthma, uncomplicated: Secondary | ICD-10-CM | POA: Insufficient documentation

## 2012-11-12 DIAGNOSIS — E119 Type 2 diabetes mellitus without complications: Secondary | ICD-10-CM | POA: Insufficient documentation

## 2012-11-12 DIAGNOSIS — Z79899 Other long term (current) drug therapy: Secondary | ICD-10-CM | POA: Insufficient documentation

## 2012-11-12 DIAGNOSIS — E785 Hyperlipidemia, unspecified: Secondary | ICD-10-CM | POA: Insufficient documentation

## 2012-11-12 MED ORDER — OXYCODONE-ACETAMINOPHEN 5-325 MG PO TABS
2.0000 | ORAL_TABLET | Freq: Once | ORAL | Status: AC
  Administered 2012-11-12: 2 via ORAL
  Filled 2012-11-12: qty 2

## 2012-11-12 NOTE — ED Notes (Signed)
Per EMS: Pt reports falling at a thrift shop approximately 40 minutes ago. Pt reports bilateral knee pain, right ankle pain. EMS reports that the bathroom floor was flooded, however the patient had no signs of being wet. Pt has had a right foot surgery with fusion and pins, pt reports that her foot twisted inside her boot as she feel. Pt also reports having no cartilage in her left knee.

## 2012-11-12 NOTE — ED Notes (Signed)
Patient returned from X-ray 

## 2012-11-12 NOTE — ED Notes (Signed)
Patient transported to X-ray 

## 2012-11-12 NOTE — ED Notes (Signed)
[id number]<AV> Expected date:<BR> Expected time:<BR> Means of arrival:<BR> Comments:<BR> EMS, 42 F; fall c/o rt knee and ankle pain

## 2012-11-12 NOTE — ED Provider Notes (Signed)
History     CSN: [id number]  Arrival date & time 11/12/12  2114   First MD Initiated Contact with Patient 11/12/12 2117      Chief Complaint  Patient presents with  . Fall  . Knee Pain    Bilateral   . Ankle Pain    Right     (Consider location/radiation/quality/duration/timing/severity/associated sxs/prior treatment) Patient is a 42 y.o. female presenting with fall, knee pain, and ankle pain. The history is provided by the patient.  Fall This is a new problem. Pertinent negatives include no abdominal pain and no shortness of breath.  Knee Pain Associated symptoms: no back pain   Ankle Pain Associated symptoms: no back pain    patient states that she was at a thrift store in the bathrooms were wet. She states that her feet slipped and both ankles twisted out. She states that she landed on her ankles knees and arms. She states she has pain in her shoulders elbows knees and ankles. She states she is unable to raise her arms up. There is no neck pain. No loss consciousness. She did not hit her head. She states she is out of her pain meds that she is on after right foot surgery. She states that she still wears a boot for it. She states she follows up with her orthopedic surgeon next month.  Past Medical History  Diagnosis Date  . Diabetes mellitus   . Hypertension   . Myocardial infarction   . Asthma   . Hyperlipidemia     Past Surgical History  Procedure Laterality Date  . Tubal ligation    . Midtarsal arthrodesis Right 07/17/12  . Remove implant deep Right 07/17/12  . Lapidus fusion Right 01/10/12  . Carpal tunnel release Left 2001    Left wrist  . Gastroc recession extremity Right 3//20/14    Right foot    No family history on file.  History  Substance Use Topics  . Smoking status: Never Smoker   . Smokeless tobacco: Never Used  . Alcohol Use: No    OB History   Grav Para Term Preterm Abortions TAB SAB Ect Mult Living                  Review of Systems   Constitutional: Negative for activity change and appetite change.  Respiratory: Negative for cough and shortness of breath.   Gastrointestinal: Negative for abdominal pain and blood in stool.  Genitourinary: Negative for dysuria.  Musculoskeletal: Negative for back pain.       Patient complaining of pain in her shoulders elbows knees and ankles.    Allergies  Celery oil and Latex  Home Medications   Current Outpatient Rx  Name  Route  Sig  Dispense  Refill  . albuterol (PROVENTIL HFA;VENTOLIN HFA) 108 (90 BASE) MCG/ACT inhaler   Inhalation   Inhale 2 puffs into the lungs every 4 (four) hours as needed for wheezing or shortness of breath.   18 g   0   . amLODipine (NORVASC) 10 MG tablet   Oral   Take 1 tablet (10 mg total) by mouth daily. For blood pressure control.   30 tablet   0   . cyclobenzaprine (FLEXERIL) 10 MG tablet   Oral   Take 10 mg by mouth 3 (three) times daily as needed for muscle spasms.          . DULoxetine (CYMBALTA) 30 MG capsule   Oral   Take 1 capsule (30  mg total) by mouth daily. For depression and pain.   30 capsule   0   . gabapentin (NEURONTIN) 300 MG capsule   Oral   Take 1 capsule (300 mg total) by mouth 3 (three) times daily at 8am, 2pm and bedtime. For anxiety, pain and mood stabilization.   90 capsule   0   . glipiZIDE (GLUCOTROL XL) 10 MG 24 hr tablet   Oral   Take 1 tablet (10 mg total) by mouth daily with breakfast. For blood sugar control.   30 tablet   0   . hydrOXYzine (ATARAX/VISTARIL) 50 MG tablet   Oral   Take 50 mg by mouth 3 (three) times daily as needed for anxiety.          Marland Kitchen LORazepam (ATIVAN) 1 MG tablet   Oral   Take 1 tablet (1 mg total) by mouth 3 (three) times daily as needed for anxiety.   15 tablet   0   . metFORMIN (GLUCOPHAGE) 500 MG tablet   Oral   Take 1 tablet (500 mg total) by mouth 2 (two) times daily with a meal. For blood sugar control.   60 tablet   0   . oxyCODONE-acetaminophen  (PERCOCET) 10-325 MG per tablet   Oral   Take 1 tablet by mouth every 8 (eight) hours as needed for pain.   90 tablet   0   . risperiDONE (RISPERDAL) 1 MG tablet   Oral   Take 1 mg by mouth at bedtime.         Marland Kitchen tiotropium (SPIRIVA) 18 MCG inhalation capsule   Inhalation   Place 18 mcg into inhaler and inhale daily.           BP 154/97  Pulse 85  Temp(Src) 97.6 F (36.4 C) (Oral)  Resp 22  SpO2 100%  LMP 10/29/2012  Physical Exam  Constitutional: She appears well-developed and well-nourished.  Patient is obese  HENT:  Head: Normocephalic and atraumatic.  Eyes: Pupils are equal, round, and reactive to light.  Neck: Normal range of motion. Neck supple.  Cardiovascular: Normal rate and regular rhythm.   Pulmonary/Chest: Effort normal and breath sounds normal.  Abdominal: Soft. There is no tenderness.  Musculoskeletal: She exhibits tenderness.  Tenderness over bilateral shoulders. Decreased range of motion bilateral shoulders. No Crepitance. Tenderness over bilateral low-dose. Decreased range of motion in bilateral elbows. No tenderness over wrist. Neurovascular intact distally. Tenderness over bilateral knees. No effusion. Decreased range of motion. Tenderness in bilateral ankles diffusely. Well-healing surgical sites on right foot. Neurovascular intact distally    ED Course  Procedures (including critical care time)  Labs Reviewed - No data to display Dg Shoulder Right  11/12/2012   *RADIOLOGY REPORT*  Clinical Data: Status post fall; right shoulder pain.  RIGHT SHOULDER - 2+ VIEW  Comparison: None.  Findings: There is no evidence of fracture or dislocation.  The right humeral head is seated within the glenoid fossa.  Mild degenerative change is noted at the right acromioclavicular joint. No significant soft tissue abnormalities are seen.  The visualized portions of the right lung are clear.  IMPRESSION: No evidence of fracture or dislocation.   Original Report  Authenticated By: Tonia Ghent, M.D.   Dg Elbow Complete Left  11/12/2012   *RADIOLOGY REPORT*  Clinical Data: Status post fall; bilateral elbow pain.  LEFT ELBOW - COMPLETE 3+ VIEW  Comparison: None.  Findings: There is no evidence of fracture or dislocation.  The visualized joint spaces  are preserved.  No significant joint effusion is identified.  The soft tissues are unremarkable in appearance.  IMPRESSION: No evidence of fracture or dislocation.   Original Report Authenticated By: Tonia Ghent, M.D.   Dg Elbow Complete Right  11/12/2012   *RADIOLOGY REPORT*  Clinical Data: Status post fall; right elbow pain.  RIGHT ELBOW - COMPLETE 3+ VIEW  Comparison: None.  Findings: A small elbow joint effusion is suspected.  This raises question for an occult fracture, possibly along the radial head, though the fracture is not well seen.  Visualized joint spaces are grossly unremarkable.  No additional soft tissue abnormalities are seen.  Mild chronic cortical irregularity is noted along the coronoid process.  IMPRESSION: Suspect elbow joint effusion, raising question for occult fracture, possibly along the radial head, though the fracture is not well seen.   Original Report Authenticated By: Tonia Ghent, M.D.   Dg Ankle Complete Left  11/12/2012   *RADIOLOGY REPORT*  Clinical Data: Ankle pain  LEFT ANKLE COMPLETE - 3+ VIEW  Comparison: None.  Findings: Ankle mortise intact and the talar dome is normal.  No malleolar fracture.  Calcaneus is normal.  IMPRESSION: No evidence of ankle fracture.   Original Report Authenticated By: Genevive Bi, M.D.   Dg Ankle Complete Right  11/12/2012   *RADIOLOGY REPORT*  Clinical Data: Bilateral ankle pain, fall  RIGHT ANKLE - COMPLETE 3+ VIEW  Comparison: None.  Findings: There is some widening of the lateral ankle mortise. Talar dome is normal.  No malleolar fracture.  There is some deformity of the posterior talus which appears chronic.  There is internal fixation of  the tarsal metatarsal joint of the first ray.  IMPRESSION:  1.  No clear acute findings in the right ankle.  2.  Mild widening of the ankle mortise may be chronic. 3.  Internal fixation of the midfoot.   Original Report Authenticated By: Genevive Bi, M.D.   Dg Shoulder Left  11/12/2012   *RADIOLOGY REPORT*  Clinical Data: Status post fall; bilateral shoulder pain.  LEFT SHOULDER - 2+ VIEW  Comparison: None.  Findings: There is no evidence of fracture or dislocation.  The left humeral head is seated within the glenoid fossa.  Mild cortical irregularity is noted along the greater tuberosity, with question of underlying chronic Hill-Sachs lesion.  The acromioclavicular joint is unremarkable in appearance.  No significant soft tissue abnormalities are seen.  The visualized portions of the left lung are clear.  IMPRESSION: No evidence of acute fracture or dislocation.   Original Report Authenticated By: Tonia Ghent, M.D.   Dg Knee Complete 4 Views Left  11/12/2012   *RADIOLOGY REPORT*  Clinical Data: Bilateral knee pain  LEFT KNEE - COMPLETE 4+ VIEW  Comparison: None.  Findings: No fracture or dislocation left knee.  No joint effusion. There is narrowing of the medial compartment with osteophytosis  IMPRESSION: 1.  No evidence of fracture. 2.  Osteoarthritis of the medial compartment.   Original Report Authenticated By: Genevive Bi, M.D.   Dg Knee Complete 4 Views Right  11/12/2012   *RADIOLOGY REPORT*  Clinical Data: Fall, bilateral knee pain  RIGHT KNEE - COMPLETE 4+ VIEW  Comparison: None.  Findings: No fracture dislocation of the right knee.  No joint effusion.  Spurring of the patella noted.  IMPRESSION: No fracture or dislocation.   Original Report Authenticated By: Genevive Bi, M.D.     1. Fall, initial encounter   2. Radial head fracture, closed, right, initial encounter  3. Knee contusion, unspecified laterality, initial encounter   4. Shoulder strain, unspecified laterality,  initial encounter   5. Ankle sprain, unspecified laterality, initial encounter       MDM  Patient with fall. Pain in every extremity. X-rays are reassuring except for possible occult radial head fracture of the right elbow. Patient was given a sling and will followup with or so. Patient initially denied having any medications. States she ran out 2 weeks ago. [place] drug database records reviewed and she had 90 pills filled 8 days ago. She later stated that she did have 30 pills left over. She'll be discharged home.        Juliet Rude. Rubin Payor, MD 11/13/12 0000

## 2012-11-18 ENCOUNTER — Ambulatory Visit (INDEPENDENT_AMBULATORY_CARE_PROVIDER_SITE_OTHER): Payer: Medicaid Other | Admitting: Podiatry

## 2012-11-18 ENCOUNTER — Encounter: Payer: Self-pay | Admitting: Podiatry

## 2012-11-18 DIAGNOSIS — Z9889 Other specified postprocedural states: Secondary | ICD-10-CM

## 2012-11-18 DIAGNOSIS — M21969 Unspecified acquired deformity of unspecified lower leg: Secondary | ICD-10-CM

## 2012-11-18 DIAGNOSIS — M79609 Pain in unspecified limb: Secondary | ICD-10-CM

## 2012-11-18 DIAGNOSIS — M216X9 Other acquired deformities of unspecified foot: Secondary | ICD-10-CM

## 2012-11-18 DIAGNOSIS — M25571 Pain in right ankle and joints of right foot: Secondary | ICD-10-CM

## 2012-11-18 DIAGNOSIS — M25579 Pain in unspecified ankle and joints of unspecified foot: Secondary | ICD-10-CM

## 2012-11-18 MED ORDER — OXYCODONE-ACETAMINOPHEN 10-325 MG PO TABS: 1.0000 | ORAL_TABLET | Freq: Three times a day (TID) | ORAL | Status: DC | PRN

## 2012-11-18 NOTE — Progress Notes (Signed)
Subjective: 11 weeks post op. Patient came in with right arm wrapped in a sling and CAM walker on right lower limb. Stated that she slipped on wet floor at a store bathroom and fell on the right foot and the knee, and fractured right elbow bone. She fell on foot that had surgery on.  This happened last Wednesday, May 28. Stated that right foot is sore and hurting more. Patient feels the fall brought in more pain now on the foot. Objective: Pain and swelling on right foot more with weight bearing. No acute redness or erythema noted. X-ray; Focused at the fusion site with plate and screws. One of proximal screw head possibly loose.  Show some bone callus medial aspect of the base of the first Metatarsocuneiform fusion site.  On AP view, still show lucency at the fusion site.  Assessment: Accidental fall, and more pain and swelling at surgical site. Delayed/none healing bone of the fusion site. Plan: Explained clinical findings. Continue to stay in CAM walker. Will try bone stimulator if available.

## 2012-11-27 ENCOUNTER — Emergency Department (HOSPITAL_COMMUNITY): Payer: Medicaid Other

## 2012-11-27 ENCOUNTER — Emergency Department (HOSPITAL_COMMUNITY)
Admission: EM | Admit: 2012-11-27 | Discharge: 2012-11-27 | Disposition: A | Discharge status: Home / Self Care | Payer: Medicaid Other | Attending: Emergency Medicine | Admitting: Emergency Medicine

## 2012-11-27 ENCOUNTER — Encounter (HOSPITAL_COMMUNITY): Payer: Self-pay

## 2012-11-27 DIAGNOSIS — J45909 Unspecified asthma, uncomplicated: Secondary | ICD-10-CM | POA: Insufficient documentation

## 2012-11-27 DIAGNOSIS — E785 Hyperlipidemia, unspecified: Secondary | ICD-10-CM | POA: Insufficient documentation

## 2012-11-27 DIAGNOSIS — E119 Type 2 diabetes mellitus without complications: Secondary | ICD-10-CM | POA: Insufficient documentation

## 2012-11-27 DIAGNOSIS — E669 Obesity, unspecified: Secondary | ICD-10-CM | POA: Insufficient documentation

## 2012-11-27 DIAGNOSIS — F3289 Other specified depressive episodes: Secondary | ICD-10-CM | POA: Insufficient documentation

## 2012-11-27 DIAGNOSIS — R5383 Other fatigue: Secondary | ICD-10-CM | POA: Insufficient documentation

## 2012-11-27 DIAGNOSIS — F329 Major depressive disorder, single episode, unspecified: Secondary | ICD-10-CM | POA: Insufficient documentation

## 2012-11-27 DIAGNOSIS — R5381 Other malaise: Secondary | ICD-10-CM | POA: Insufficient documentation

## 2012-11-27 DIAGNOSIS — Z79899 Other long term (current) drug therapy: Secondary | ICD-10-CM | POA: Insufficient documentation

## 2012-11-27 DIAGNOSIS — I252 Old myocardial infarction: Secondary | ICD-10-CM | POA: Insufficient documentation

## 2012-11-27 DIAGNOSIS — Z794 Long term (current) use of insulin: Secondary | ICD-10-CM | POA: Insufficient documentation

## 2012-11-27 DIAGNOSIS — R0789 Other chest pain: Secondary | ICD-10-CM | POA: Insufficient documentation

## 2012-11-27 DIAGNOSIS — F17200 Nicotine dependence, unspecified, uncomplicated: Secondary | ICD-10-CM | POA: Insufficient documentation

## 2012-11-27 DIAGNOSIS — F4310 Post-traumatic stress disorder, unspecified: Secondary | ICD-10-CM | POA: Insufficient documentation

## 2012-11-27 DIAGNOSIS — R0602 Shortness of breath: Secondary | ICD-10-CM | POA: Insufficient documentation

## 2012-11-27 DIAGNOSIS — R002 Palpitations: Secondary | ICD-10-CM | POA: Insufficient documentation

## 2012-11-27 DIAGNOSIS — M2550 Pain in unspecified joint: Secondary | ICD-10-CM | POA: Insufficient documentation

## 2012-11-27 DIAGNOSIS — R000 Tachycardia, unspecified: Secondary | ICD-10-CM | POA: Insufficient documentation

## 2012-11-27 HISTORY — DX: Sleep apnea, unspecified: G47.30

## 2012-11-27 HISTORY — DX: Post-traumatic stress disorder, unspecified: F43.10

## 2012-11-27 HISTORY — DX: Major depressive disorder, single episode, unspecified: F32.9

## 2012-11-27 HISTORY — DX: Depression, unspecified: F32.A

## 2012-11-27 LAB — BASIC METABOLIC PANEL
BUN: 8 mg/dL (ref 6–23)
Calcium: 9.8 mg/dL (ref 8.4–10.5)
Chloride: 97 mEq/L (ref 96–112)
Creatinine, Ser: 0.83 mg/dL (ref 0.50–1.10)
GFR calc Af Amer: >90 mL/min (ref >90)
GFR calc non Af Amer: 85 mL/min — ABNORMAL LOW (ref >90)

## 2012-11-27 LAB — CBC WITH DIFFERENTIAL/PLATELET
Basophils Absolute: 0 10*3/uL (ref 0.0–0.1)
Basophils Relative: 0 % (ref 0–1)
Eosinophils Absolute: 0.1 10*3/uL (ref 0.0–0.7)
HCT: 41 % (ref 36.0–46.0)
MCH: 26.7 pg (ref 26.0–34.0)
MCHC: 32.9 g/dL (ref 30.0–36.0)
Monocytes Absolute: 0.5 10*3/uL (ref 0.1–1.0)
Monocytes Relative: 7 % (ref 3–12)
Neutro Abs: 4.3 10*3/uL (ref 1.7–7.7)
RDW: 12.9 % (ref 11.5–15.5)

## 2012-11-27 LAB — RAPID URINE DRUG SCREEN, HOSP PERFORMED
Barbiturates: NOT DETECTED
Cocaine: NOT DETECTED
Tetrahydrocannabinol: NOT DETECTED

## 2012-11-27 LAB — D-DIMER, QUANTITATIVE: D-Dimer, Quant: 0.34 ug/mL-FEU (ref 0.00–0.48)

## 2012-11-27 LAB — POCT I-STAT TROPONIN I: Troponin i, poc: 0.02 ng/mL (ref 0.00–0.08)

## 2012-11-27 MED ORDER — SODIUM CHLORIDE 0.9 % IV BOLUS (SEPSIS)
1000.0000 mL | Freq: Once | INTRAVENOUS | Status: AC
  Administered 2012-11-27: 1000 mL via INTRAVENOUS

## 2012-11-27 MED ORDER — IOHEXOL 350 MG/ML SOLN
100.0000 mL | Freq: Once | INTRAVENOUS | Status: AC | PRN
  Administered 2012-11-27: 100 mL via INTRAVENOUS

## 2012-11-27 MED ORDER — IOHEXOL 300 MG/ML  SOLN: 100.0000 mL | Freq: Once | INTRAMUSCULAR | Status: DC | PRN

## 2012-11-27 NOTE — ED Notes (Signed)
Pt ambulated to bathroom without assistance 

## 2012-11-27 NOTE — ED Notes (Signed)
Pt ambulated to bathroom 

## 2012-11-27 NOTE — ED Provider Notes (Signed)
History     CSN: [id number]  Arrival date & time 11/27/12  1730   First MD Initiated Contact with Patient 11/27/12 1902      Chief Complaint  Patient presents with  . Hypertension  . Tachycardia    (Consider location/radiation/quality/duration/timing/severity/associated sxs/prior treatment) HPI Comments: Patient history diabetes hypertension and asthma presents with palpitations and tachycardia. She has a home health nurse that comes out and helps her with her medications. Home health nursing noted her heart rate to be elevated in the 130s. She states that she's had some shortness of breath however this has been going on intermittently for months. She also notes some intermittent tightness across her chest which is also been going on for months it is unchanged. She does admit to some pleuritic chest pain at times as well. She denies any cough or chest congestion. She denies he fevers or chills. She denies any nausea or vomiting. She denies any dizziness. She has some intermittent headaches which she feels is associated to a knot that she has been back of her head which is also been on for years. She did recently have foot surgery and has a cam walker in place. She states she had a cast on prior to that. She denies a known history of blood clots.  Patient is a 42 y.o. female presenting with hypertension.  Hypertension Associated symptoms include chest pain (intermittent) and shortness of breath. Pertinent negatives include no abdominal pain and no headaches.    Past Medical History  Diagnosis Date  . Diabetes mellitus   . Hypertension   . Myocardial infarction   . Asthma   . Hyperlipidemia   . Sleep apnea   . Depression   . PTSD (post-traumatic stress disorder)     Past Surgical History  Procedure Laterality Date  . Tubal ligation    . Midtarsal arthrodesis Right 07/17/12  . Remove implant deep Right 07/17/12  . Lapidus fusion Right 01/10/12  . Carpal tunnel release Left 2001     Left wrist  . Gastroc recession extremity Right 3//20/14    Right foot    No family history on file.  History  Substance Use Topics  . Smoking status: Current Every Day Smoker  . Smokeless tobacco: Never Used  . Alcohol Use: No    OB History   Grav Para Term Preterm Abortions TAB SAB Ect Mult Living                  Review of Systems  Constitutional: Positive for fatigue. Negative for fever, chills and diaphoresis.  HENT: Negative for congestion, rhinorrhea and sneezing.   Eyes: Negative.   Respiratory: Positive for shortness of breath. Negative for cough and chest tightness.   Cardiovascular: Positive for chest pain (intermittent) and palpitations. Negative for leg swelling.  Gastrointestinal: Negative for nausea, vomiting, abdominal pain, diarrhea and blood in stool.  Genitourinary: Negative for frequency, hematuria, flank pain and difficulty urinating.  Musculoskeletal: Positive for arthralgias. Negative for back pain.  Skin: Negative for rash.  Neurological: Negative for dizziness, speech difficulty, weakness, numbness and headaches.    Allergies  Celery oil and Latex  Home Medications   Current Outpatient Rx  Name  Route  Sig  Dispense  Refill  . acetaminophen (TYLENOL) 500 MG tablet   Oral   Take 1,000 mg by mouth every 6 (six) hours as needed for pain.         Marland Kitchen albuterol (PROVENTIL HFA;VENTOLIN HFA) 108 (90 BASE) MCG/ACT inhaler  Inhalation   Inhale 2 puffs into the lungs every 4 (four) hours as needed for wheezing or shortness of breath.   18 g   0   . amLODipine (NORVASC) 10 MG tablet   Oral   Take 1 tablet (10 mg total) by mouth daily. For blood pressure control.   30 tablet   0   . cyclobenzaprine (FLEXERIL) 10 MG tablet   Oral   Take 10 mg by mouth 3 (three) times daily as needed for muscle spasms.          . DULoxetine (CYMBALTA) 30 MG capsule   Oral   Take 1 capsule (30 mg total) by mouth daily. For depression and pain.   30 capsule    0   . gabapentin (NEURONTIN) 300 MG capsule   Oral   Take 1 capsule (300 mg total) by mouth 3 (three) times daily at 8am, 2pm and bedtime. For anxiety, pain and mood stabilization.   90 capsule   0   . glipiZIDE (GLUCOTROL XL) 10 MG 24 hr tablet   Oral   Take 1 tablet (10 mg total) by mouth daily with breakfast. For blood sugar control.   30 tablet   0   . hydrOXYzine (ATARAX/VISTARIL) 50 MG tablet   Oral   Take 50 mg by mouth 3 (three) times daily as needed for anxiety.          . insulin glargine (LANTUS) 100 UNIT/ML injection   Subcutaneous   Inject 10 Units into the skin daily.         Marland Kitchen LORazepam (ATIVAN) 1 MG tablet   Oral   Take 1 tablet (1 mg total) by mouth 3 (three) times daily as needed for anxiety.   15 tablet   0   . metFORMIN (GLUCOPHAGE) 500 MG tablet   Oral   Take 1 tablet (500 mg total) by mouth 2 (two) times daily with a meal. For blood sugar control.   60 tablet   0   . oxyCODONE-acetaminophen (PERCOCET) 10-325 MG per tablet   Oral   Take 1 tablet by mouth every 8 (eight) hours as needed for pain.   90 tablet   0   . risperiDONE (RISPERDAL) 1 MG tablet   Oral   Take 1 mg by mouth at bedtime.         Marland Kitchen tiotropium (SPIRIVA) 18 MCG inhalation capsule   Inhalation   Place 18 mcg into inhaler and inhale daily.           BP 146/86  Pulse 128  Temp(Src) 98.5 F (36.9 C) (Oral)  Resp 10  SpO2 97%  LMP 11/12/2012  Physical Exam  Constitutional: She is oriented to person, place, and time. She appears well-developed and well-nourished.  obese  HENT:  Head: Normocephalic and atraumatic.  Mouth/Throat: Oropharynx is clear and moist.  Eyes: Pupils are equal, round, and reactive to light.  Neck: Normal range of motion. Neck supple.  Cardiovascular: Normal rate, regular rhythm and normal heart sounds.   Pulmonary/Chest: Effort normal and breath sounds normal. No respiratory distress. She has no wheezes. She has no rales. She exhibits no  tenderness.  Abdominal: Soft. Bowel sounds are normal. There is no tenderness. There is no rebound and no guarding.  Musculoskeletal: Normal range of motion. She exhibits no edema.  roght CAM walker present  Lymphadenopathy:    She has no cervical adenopathy.  Neurological: She is alert and oriented to person, place, and time.  Skin: Skin is warm and dry. No rash noted.  Psychiatric: She has a normal mood and affect.    ED Course  Procedures (including critical care time)  Results for orders placed during the hospital encounter of 11/27/12  CBC WITH DIFFERENTIAL      Result Value Range   WBC 6.9  4.0 - 10.5 K/uL   RBC 5.06  3.87 - 5.11 MIL/uL   Hemoglobin 13.5  12.0 - 15.0 g/dL   HCT 78.4  69.6 - 29.5 %   MCV 81.0  78.0 - 100.0 fL   MCH 26.7  26.0 - 34.0 pg   MCHC 32.9  30.0 - 36.0 g/dL   RDW 28.4  13.2 - 44.0 %   Platelets 196  150 - 400 K/uL   Neutrophils Relative % 62  43 - 77 %   Neutro Abs 4.3  1.7 - 7.7 K/uL   Lymphocytes Relative 30  12 - 46 %   Lymphs Abs 2.1  0.7 - 4.0 K/uL   Monocytes Relative 7  3 - 12 %   Monocytes Absolute 0.5  0.1 - 1.0 K/uL   Eosinophils Relative 1  0 - 5 %   Eosinophils Absolute 0.1  0.0 - 0.7 K/uL   Basophils Relative 0  0 - 1 %   Basophils Absolute 0.0  0.0 - 0.1 K/uL  BASIC METABOLIC PANEL      Result Value Range   Sodium 133 (*) 135 - 145 mEq/L   Potassium 3.7  3.5 - 5.1 mEq/L   Chloride 97  96 - 112 mEq/L   CO2 24  19 - 32 mEq/L   Glucose, Bld 265 (*) 70 - 99 mg/dL   BUN 8  6 - 23 mg/dL   Creatinine, Ser 1.02  0.50 - 1.10 mg/dL   Calcium 9.8  8.4 - 72.5 mg/dL   GFR calc non Af Amer 85 (*) >90 mL/min   GFR calc Af Amer >90  >90 mL/min  URINE RAPID DRUG SCREEN (HOSP PERFORMED)      Result Value Range   Opiates NONE DETECTED  NONE DETECTED   Cocaine NONE DETECTED  NONE DETECTED   Benzodiazepines NONE DETECTED  NONE DETECTED   Amphetamines NONE DETECTED  NONE DETECTED   Tetrahydrocannabinol NONE DETECTED  NONE DETECTED    Barbiturates NONE DETECTED  NONE DETECTED  D-DIMER, QUANTITATIVE      Result Value Range   D-Dimer, Quant 0.34  0.00 - 0.48 ug/mL-FEU  POCT I-STAT TROPONIN I      Result Value Range   Troponin i, poc 0.02  0.00 - 0.08 ng/mL   Comment 3             Ct Angio Chest Pe W/cm &/or Wo Cm  11/27/2012   *RADIOLOGY REPORT*  Clinical Data: Shortness of breath, tachycardia, hypertension, evaluate for pulmonary embolism  CT ANGIOGRAPHY CHEST  Technique:  Multidetector CT imaging of the chest using the standard protocol during bolus administration of intravenous contrast. Multiplanar reconstructed images including MIPs were obtained and reviewed to evaluate the vascular anatomy.  Contrast: OMNIPAQUE IOHEXOL 350 MG/ML SOLN  Comparison: Chest CT - 05/30/2012  Vascular Findings:  There is suboptimal opacification of the pulmonary arterial tree with the main pulmonary artery measuring only 169 HU.  There are no discrete filling defects within the main, right, left or segmental branches of the pulmonary arteries to suggest pulmonary embolism. Evaluation of the distal subsegmental pulmonary arteries is degraded secondary to suboptimal  vessel opacification and patient body habitus.  There is unchanged enlargement of the caliber of the main pulmonary artery measuring 3.4 cm in diameter.  Normal heart size.  No pericardial effusion.  Normal caliber of the thoracic aorta.  Bovine configuration of the aortic arch.  No periaortic stranding.  ---------------------------------------------------------  Nonvascular findings:  There is very mild ground glass opacification in most conspicuous within the right middle lobe and lingula, favored to represent atelectasis.  No focal airspace opacities.  No pleural effusion or pneumothorax.  There is mild thickening of the central aspect of the segmental bronchi.  No mediastinal, hilar or axillary lymphadenopathy.  Limited evaluation of the upper abdomen demonstrates diastases of the  rectus abdominal musculature.  No acute or aggressive osseous abnormalities.  IMPRESSION: 1.  Negative for pulmonary embolism to the level of the bilateral subsegmental pulmonary arteries.  2.  Unchanged enlargement of the caliber of the main pulmonary artery, nonspecific though could be seen in the setting of pulmonary arterial hypertension.  Further evaluation with cardiac echo may be performed as clinically indicated.   Original Report Authenticated By: Tacey Ruiz, MD     Date: 11/27/2012  Rate: 75  Rhythm: normal sinus rhythm  QRS Axis: normal  Intervals: normal  ST/T Wave abnormalities: nonspecific ST/T changes  Conduction Disutrbances:none  Narrative Interpretation:   Old EKG Reviewed: changes noted, mild t wave flattening   Date: 11/27/2012  Rate: 123  Rhythm: sinus tachycardia  QRS Axis: normal  Intervals: normal  ST/T Wave abnormalities: nonspecific ST/T changes  Conduction Disutrbances:none  Narrative Interpretation:   Old EKG Reviewed: unchanged    1. Tachycardia       MDM  Patient with sinus tachycardia. She currently does not have any chest pain. There's nothing suggestive of acute coronary syndrome. Her troponin is negative. There's no suggestion of infectious etiology for the tachycardia. There is no evidence of dehydration. She was given some IV fluids here in her heart rate has improved but is still mildly tachycardic into the 120s. Her blood pressures been stable. There is no evidence of pulmonary embolus. She was discharged home in good condition and will followup with her primary care physician.        Rolan Bucco, MD 11/27/12 2132

## 2012-11-27 NOTE — ED Notes (Signed)
Pt sent here from her homehealth nurse d/t HTN and tachycardia, pt c/o headache, shoulder pain where she fell 2wks ago

## 2012-12-03 ENCOUNTER — Encounter: Payer: Self-pay | Admitting: Podiatry

## 2012-12-03 ENCOUNTER — Ambulatory Visit (INDEPENDENT_AMBULATORY_CARE_PROVIDER_SITE_OTHER): Payer: Medicaid Other | Admitting: Podiatry

## 2012-12-03 VITALS — BP 132/85 | HR 89

## 2012-12-03 DIAGNOSIS — M25571 Pain in right ankle and joints of right foot: Secondary | ICD-10-CM

## 2012-12-03 DIAGNOSIS — M25579 Pain in unspecified ankle and joints of unspecified foot: Secondary | ICD-10-CM

## 2012-12-03 MED ORDER — LIDOCAINE 5 % EX PTCH: 1.0000 | MEDICATED_PATCH | CUTANEOUS | Status: DC

## 2012-12-03 MED ORDER — OXYCODONE-ACETAMINOPHEN 10-325 MG PO TABS: 1.0000 | ORAL_TABLET | Freq: Three times a day (TID) | ORAL | Status: DC | PRN

## 2012-12-03 NOTE — Progress Notes (Signed)
Subjective: Patient presents stating her right foot was hurting. Walking in CAM walker. This is status post 3 months Fusion of the first MCJ right and Gastroc recession right. Got in touch with distributor rep for bone stimulator. We were informed it would take another 2 weeks.   Objective: Range of motion causes pain on both feet at ankle on left, and surgery site on right, dorsum and back of leg.  No edema or erythema noted.  Assessment: Delayed bone healing. Last X-ray (11 weeks post op) did not show clear evidence of bone healing at fusion site. R/O Intolerance to hardware.  Plan: Wait on bone stimulator. Add Lidoderm patch for pain management. Patient is to use while resting or asleep to get good body rest. Pain pills (Vicodin 10/325) as needed. We will contact patient as soon as we get the bone stimulator.  Continue with CAM walker. Ace wrap dispensed to use under the CAM walker.

## 2012-12-10 ENCOUNTER — Encounter (INDEPENDENT_AMBULATORY_CARE_PROVIDER_SITE_OTHER): Payer: Medicaid Other | Admitting: Vascular Surgery

## 2012-12-10 DIAGNOSIS — M79609 Pain in unspecified limb: Secondary | ICD-10-CM

## 2012-12-15 ENCOUNTER — Ambulatory Visit: Payer: Self-pay | Admitting: Surgery

## 2013-01-13 DIAGNOSIS — M79609 Pain in unspecified limb: Secondary | ICD-10-CM

## 2013-01-30 ENCOUNTER — Ambulatory Visit (INDEPENDENT_AMBULATORY_CARE_PROVIDER_SITE_OTHER): Payer: Medicaid Other | Admitting: Podiatry

## 2013-01-30 DIAGNOSIS — M216X9 Other acquired deformities of unspecified foot: Secondary | ICD-10-CM

## 2013-01-30 DIAGNOSIS — M25571 Pain in right ankle and joints of right foot: Secondary | ICD-10-CM

## 2013-01-30 DIAGNOSIS — Z9889 Other specified postprocedural states: Secondary | ICD-10-CM

## 2013-01-30 MED ORDER — OXYCODONE-ACETAMINOPHEN 10-325 MG PO TABS: 1.0000 | ORAL_TABLET | Freq: Three times a day (TID) | ORAL | Status: DC | PRN

## 2013-01-30 NOTE — Progress Notes (Signed)
Patient came in with her younger sister who was concerned with her recovery.  Stated that the electronic brace is not helping. She has been wearing the brace over a month.  Her foot swells and hurts. Her case worker is questioning the nature of her problem.  She is not using wheelchair since they took them away for having extended time period.  Stated that her blood sugar was 225 yesterday.   Objective: Pain on left foot with prolonged ambulation. Interrupted treatment with bone stimulator. Continues to run high on blood sugar. Radiographic examination show bone healing consistent with the surgery done on the first MCJ fusion with screws and plate. Positive signs of bone healing with crossing trabecular patterns.  Assessment: Reviewed the treatment plan with bone stimulator that will take 3-4 months. Post op pain on left foot. Bone healing in progress.  Plan: Advised to keep the electronic brace for the next 3 months. If pain and swelling continues, we will remove hardware. Patient is to return back to her PCP to control her blood sugar. Will monitor her monthly.

## 2013-02-05 ENCOUNTER — Emergency Department (HOSPITAL_COMMUNITY)
Admission: EM | Admit: 2013-02-05 | Discharge: 2013-02-06 | Disposition: A | Discharge status: Home / Self Care | Payer: Medicaid Other | Attending: Emergency Medicine | Admitting: Emergency Medicine

## 2013-02-05 ENCOUNTER — Encounter (HOSPITAL_COMMUNITY): Payer: Self-pay | Admitting: *Deleted

## 2013-02-05 DIAGNOSIS — G43909 Migraine, unspecified, not intractable, without status migrainosus: Secondary | ICD-10-CM | POA: Insufficient documentation

## 2013-02-05 DIAGNOSIS — Z794 Long term (current) use of insulin: Secondary | ICD-10-CM | POA: Insufficient documentation

## 2013-02-05 DIAGNOSIS — F3289 Other specified depressive episodes: Secondary | ICD-10-CM | POA: Insufficient documentation

## 2013-02-05 DIAGNOSIS — R519 Headache, unspecified: Secondary | ICD-10-CM

## 2013-02-05 DIAGNOSIS — S93409A Sprain of unspecified ligament of unspecified ankle, initial encounter: Secondary | ICD-10-CM | POA: Insufficient documentation

## 2013-02-05 DIAGNOSIS — R739 Hyperglycemia, unspecified: Secondary | ICD-10-CM

## 2013-02-05 DIAGNOSIS — Z79899 Other long term (current) drug therapy: Secondary | ICD-10-CM | POA: Insufficient documentation

## 2013-02-05 DIAGNOSIS — Y939 Activity, unspecified: Secondary | ICD-10-CM | POA: Insufficient documentation

## 2013-02-05 DIAGNOSIS — I10 Essential (primary) hypertension: Secondary | ICD-10-CM | POA: Insufficient documentation

## 2013-02-05 DIAGNOSIS — E785 Hyperlipidemia, unspecified: Secondary | ICD-10-CM | POA: Insufficient documentation

## 2013-02-05 DIAGNOSIS — J45909 Unspecified asthma, uncomplicated: Secondary | ICD-10-CM | POA: Insufficient documentation

## 2013-02-05 DIAGNOSIS — F4310 Post-traumatic stress disorder, unspecified: Secondary | ICD-10-CM | POA: Insufficient documentation

## 2013-02-05 DIAGNOSIS — F329 Major depressive disorder, single episode, unspecified: Secondary | ICD-10-CM | POA: Insufficient documentation

## 2013-02-05 DIAGNOSIS — Y929 Unspecified place or not applicable: Secondary | ICD-10-CM | POA: Insufficient documentation

## 2013-02-05 DIAGNOSIS — F17200 Nicotine dependence, unspecified, uncomplicated: Secondary | ICD-10-CM | POA: Insufficient documentation

## 2013-02-05 DIAGNOSIS — X500XXA Overexertion from strenuous movement or load, initial encounter: Secondary | ICD-10-CM | POA: Insufficient documentation

## 2013-02-05 DIAGNOSIS — E669 Obesity, unspecified: Secondary | ICD-10-CM | POA: Insufficient documentation

## 2013-02-05 DIAGNOSIS — Z91040 Latex allergy status: Secondary | ICD-10-CM | POA: Insufficient documentation

## 2013-02-05 DIAGNOSIS — M79671 Pain in right foot: Secondary | ICD-10-CM

## 2013-02-05 DIAGNOSIS — S93401A Sprain of unspecified ligament of right ankle, initial encounter: Secondary | ICD-10-CM

## 2013-02-05 DIAGNOSIS — I252 Old myocardial infarction: Secondary | ICD-10-CM | POA: Insufficient documentation

## 2013-02-05 DIAGNOSIS — Z9889 Other specified postprocedural states: Secondary | ICD-10-CM | POA: Insufficient documentation

## 2013-02-05 DIAGNOSIS — G4730 Sleep apnea, unspecified: Secondary | ICD-10-CM | POA: Insufficient documentation

## 2013-02-05 DIAGNOSIS — E119 Type 2 diabetes mellitus without complications: Secondary | ICD-10-CM | POA: Insufficient documentation

## 2013-02-05 LAB — POCT I-STAT, CHEM 8
Chloride: 98 mEq/L (ref 96–112)
Glucose, Bld: 374 mg/dL — ABNORMAL HIGH (ref 70–99)
HCT: 42 % (ref 36.0–46.0)
Potassium: 4.4 mEq/L (ref 3.5–5.1)
Sodium: 136 mEq/L (ref 135–145)

## 2013-02-05 NOTE — ED Notes (Signed)
[facility] 324 for EMS

## 2013-02-05 NOTE — ED Notes (Signed)
Pt c/o migraine headache; right foot pain at previous surgery site a yr ago

## 2013-02-05 NOTE — ED Notes (Signed)
Per EMs pt complaining of R foot pain, had surgery x 1 year ago, also having a migraine, pt also diabetic, pt states has not followed up w/ doctor about diabetes lately.

## 2013-02-06 ENCOUNTER — Emergency Department (HOSPITAL_COMMUNITY): Payer: Medicaid Other

## 2013-02-06 LAB — GLUCOSE, CAPILLARY: Glucose-Capillary: 223 mg/dL — ABNORMAL HIGH (ref 70–99)

## 2013-02-06 MED ORDER — INSULIN ASPART 100 UNIT/ML ~~LOC~~ SOLN
10.0000 [IU] | Freq: Once | SUBCUTANEOUS | Status: AC
  Administered 2013-02-06: 10 [IU] via SUBCUTANEOUS
  Filled 2013-02-06: qty 1

## 2013-02-06 MED ORDER — OXYCODONE-ACETAMINOPHEN 5-325 MG PO TABS
2.0000 | ORAL_TABLET | Freq: Once | ORAL | Status: AC
  Administered 2013-02-06: 2 via ORAL
  Filled 2013-02-06: qty 2

## 2013-02-06 NOTE — ED Provider Notes (Signed)
CSN: [id number]     Arrival date & time 02/05/13  2008 History     First MD Initiated Contact with Patient 02/06/13 0004     Chief Complaint  Patient presents with  . Migraine  . Foot Pain   (Consider location/radiation/quality/duration/timing/severity/associated sxs/prior Treatment) HPI 42 yo female presents to the ER with multiple complaints.  She reports she has had daily headaches for the past year.  Headaches are global, last throughout the whole day, and are moderate to severe.  No photo/phono phobia.  No n/v.  No neck pain, no fevers.  No trauma.  Pt also c/o elevated blood sugars. She was seen by her pcm on Wednesday and had her lantus increased from 10 units to 25 units.  She reports her sugars have still been high.  No increased urination or thirst.  Pt also c/o acute on chronic pain to her right foot and ankle.  Pt had surgery to her foot about a year ago, has had pain since the surgery that is steadily getting worse.  She was seen this past Friday by her foot surgeon who felt patient may need another surgery.  Today she got out of bed and twisted her foot and ankle.  She has been unable to bear weight due to pain.  Past Medical History  Diagnosis Date  . Diabetes mellitus   . Hypertension   . Myocardial infarction   . Asthma   . Hyperlipidemia   . Sleep apnea   . Depression   . PTSD (post-traumatic stress disorder)    Past Surgical History  Procedure Laterality Date  . Tubal ligation    . Midtarsal arthrodesis Right 07/17/12  . Remove implant deep Right 07/17/12  . Lapidus fusion Right 01/10/12  . Carpal tunnel release Left 2001    Left wrist  . Gastroc recession extremity Right 3//20/14    Right foot   No family history on file. History  Substance Use Topics  . Smoking status: Current Every Day Smoker  . Smokeless tobacco: Never Used  . Alcohol Use: No   OB History   Grav Para Term Preterm Abortions TAB SAB Ect Mult Living                 Review of Systems   All other systems reviewed and are negative.    Allergies  Celery oil and Latex  Home Medications   Current Outpatient Rx  Name  Route  Sig  Dispense  Refill  . albuterol (PROVENTIL HFA;VENTOLIN HFA) 108 (90 BASE) MCG/ACT inhaler   Inhalation   Inhale 2 puffs into the lungs every 4 (four) hours as needed for wheezing or shortness of breath.   18 g   0   . amLODipine (NORVASC) 10 MG tablet   Oral   Take 1 tablet (10 mg total) by mouth daily. For blood pressure control.   30 tablet   0   . atenolol (TENORMIN) 50 MG tablet   Oral   Take 50 mg by mouth daily.         . cyclobenzaprine (FLEXERIL) 10 MG tablet   Oral   Take 10 mg by mouth 3 (three) times daily as needed for muscle spasms.          . DULoxetine (CYMBALTA) 30 MG capsule   Oral   Take 1 capsule (30 mg total) by mouth daily. For depression and pain.   30 capsule   0   . gabapentin (NEURONTIN) 300 MG capsule  Oral   Take 1 capsule (300 mg total) by mouth 3 (three) times daily at 8am, 2pm and bedtime. For anxiety, pain and mood stabilization.   90 capsule   0   . hydrOXYzine (ATARAX/VISTARIL) 50 MG tablet   Oral   Take 50 mg by mouth 3 (three) times daily as needed for anxiety.          . insulin glargine (LANTUS) 100 UNIT/ML injection   Subcutaneous   Inject 25 Units into the skin daily.          Marland Kitchen ketoprofen (ORUDIS) 75 MG capsule   Oral   Take 75 mg by mouth 3 (three) times daily as needed for pain (pain).         Marland Kitchen lisinopril (PRINIVIL,ZESTRIL) 20 MG tablet   Oral   Take 20 mg by mouth daily.         Marland Kitchen LORazepam (ATIVAN) 1 MG tablet   Oral   Take 1 tablet (1 mg total) by mouth 3 (three) times daily as needed for anxiety.   15 tablet   0   . metFORMIN (GLUCOPHAGE) 850 MG tablet   Oral   Take 850 mg by mouth 2 (two) times daily with a meal.         . oxyCODONE-acetaminophen (PERCOCET) 10-325 MG per tablet   Oral   Take 1 tablet by mouth every 8 (eight) hours as needed for  pain.   90 tablet   0   . risperiDONE (RISPERDAL) 1 MG tablet   Oral   Take 2 mg by mouth at bedtime.          Marland Kitchen tiotropium (SPIRIVA) 18 MCG inhalation capsule   Inhalation   Place 18 mcg into inhaler and inhale daily.         Marland Kitchen lidocaine (LIDODERM) 5 %   Transdermal   Place 1 patch onto the skin daily. Remove & Discard patch within 12 hours or as directed by MD   30 patch   0    BP 135/93  Pulse 91  Temp(Src) 99 F (37.2 C)  Resp 16  SpO2 97%  LMP 01/29/2013 Physical Exam  Nursing note and vitals reviewed. Constitutional: She is oriented to person, place, and time. She appears well-developed and well-nourished.  Obese female NAD  HENT:  Head: Normocephalic and atraumatic.  Right Ear: External ear normal.  Left Ear: External ear normal.  Nose: Nose normal.  Mouth/Throat: Oropharynx is clear and moist.  Eyes: Conjunctivae and EOM are normal. Pupils are equal, round, and reactive to light.  Neck: Normal range of motion. Neck supple. No JVD present. No tracheal deviation present. No thyromegaly present.  Cardiovascular: Normal rate, regular rhythm, normal heart sounds and intact distal pulses.  Exam reveals no gallop and no friction rub.   No murmur heard. Pulmonary/Chest: Effort normal and breath sounds normal. No stridor. No respiratory distress. She has no wheezes. She has no rales. She exhibits no tenderness.  Abdominal: Soft. Bowel sounds are normal. She exhibits no distension and no mass. There is no tenderness. There is no rebound and no guarding.  Musculoskeletal: Normal range of motion. She exhibits edema (mild soft tissue swelling noted to lateral malleolus, no warmth, crepitus) and tenderness (ttp over entire ankle, foot).  Lymphadenopathy:    She has no cervical adenopathy.  Neurological: She is alert and oriented to person, place, and time. No cranial nerve deficit. She exhibits normal muscle tone. Coordination normal.  Skin: Skin is warm  and dry. No rash  noted. No erythema. No pallor.  Psychiatric: She has a normal mood and affect. Her behavior is normal. Judgment and thought content normal.    ED Course   Procedures (including critical care time)  Labs Reviewed  POCT I-STAT, CHEM 8 - Abnormal; Notable for the following:    Glucose, Bld 374 (*)    All other components within normal limits   Dg Ankle Complete Right  02/06/2013   *RADIOLOGY REPORT*  Clinical Data: The patient up out of bed and felt something pop on the foot.  Painful anterior surface.  RIGHT ANKLE - COMPLETE 3+ VIEW  Comparison: 11/12/2012  Findings: Medial greater than lateral soft tissue swelling. Degenerative changes in the tibiotalar joint.  Suggestion of focal osteonecrosis or osteochondral defect in the medial talar dome. This is progressing since the previous study.  Postoperative changes in the first tarsometatarsal joint region.  See additional description of the right foot.  Plantar calcaneal spur.  No displaced fractures identified.  IMPRESSION: Soft tissue swelling about the right ankle.  Degenerative changes with focal osteonecrosis versus osteochondral defect at the medial talar dome.   Original Report Authenticated By: Burman Nieves, M.D.   Dg Foot Complete Right  02/06/2013   *RADIOLOGY REPORT*  Clinical Data: The patient out of bed and felt something pop on the foot.  The anterior surface of the foot is painful.  RIGHT FOOT COMPLETE - 3+ VIEW  Comparison: Right ankle 11/12/2012  Findings: Postoperative changes with plate and screw fixation across the first tarsometatarsal joint.  There is associated productive bone formation.  A screw fixation also extends across the first and second metatarsal heads.  Normal alignment of the fused segments.  Screw heads are not entirely flushed with the plate but appear stable since the previous ankle views. Degenerative changes in the first metatarsophalangeal joint as well as in multiple intertarsal joints.  Ossification over the  dorsum of the right foot, likely at the postoperative site.  This is developing since the previous study.  Plantar calcaneal spur is stable.  Dorsal soft tissue swelling.  Vascular calcifications. Non corticated ununited ossicle demonstrated medial to the talonavicular joint may represent avulsion fragment.  No displaced fractures demonstrated.  IMPRESSION: Postoperative changes at the first tarsometatarsal joint. Developing productive bone over the dorsal aspect of the postoperative change.  Dorsal soft tissue swelling.  Possible avulsion fracture of the talonavicular joint medially.   Original Report Authenticated By: Burman Nieves, M.D.   1. Ankle sprain, right, initial encounter   2. Foot pain, right   3. Hyperglycemia   4. Headache     MDM  42 yo female with multiple complaints.  Workup shows soft tissue swelling to ankle, foot.  Will refer her back to her foot surgeon, place her in ASO and crutches.  Headache ongoing for some time, no neurologic findings.  Elevated glucose improved with sq insulin.  Will refer her back to pcm.  Olivia Mackie, MD 02/06/13 [phone]

## 2013-02-17 ENCOUNTER — Ambulatory Visit (INDEPENDENT_AMBULATORY_CARE_PROVIDER_SITE_OTHER): Payer: Medicaid Other | Admitting: Podiatry

## 2013-02-17 DIAGNOSIS — M25571 Pain in right ankle and joints of right foot: Secondary | ICD-10-CM

## 2013-02-17 DIAGNOSIS — M216X9 Other acquired deformities of unspecified foot: Secondary | ICD-10-CM

## 2013-02-17 DIAGNOSIS — M25579 Pain in unspecified ankle and joints of unspecified foot: Secondary | ICD-10-CM

## 2013-02-17 DIAGNOSIS — R600 Localized edema: Secondary | ICD-10-CM | POA: Insufficient documentation

## 2013-02-17 HISTORY — DX: Pain in unspecified ankle and joints of unspecified foot: M25.579

## 2013-02-17 NOTE — Progress Notes (Signed)
Foot and ankle is swelling on both feet. Walking is slow. The left foot is hurting bad. Feel some improvement on right. Been doing what was told. Used device 3 hours/day and stayed home. Blood sugar this morning was 195 and yesterday was 230. Doctor has changed her medication.   Assessment: Post op pain and swelling. Delay healing right fusion site. In treatment with bone stimulator consistently for the past one month.  Plan: Continue with bone stimulator for another month, which will be total one and a half month.  Will check back next month.

## 2013-03-02 ENCOUNTER — Encounter: Payer: Medicaid Other | Admitting: Podiatry

## 2013-03-20 ENCOUNTER — Ambulatory Visit (INDEPENDENT_AMBULATORY_CARE_PROVIDER_SITE_OTHER): Payer: Medicaid Other | Admitting: Podiatry

## 2013-03-20 ENCOUNTER — Encounter: Payer: Self-pay | Admitting: Podiatry

## 2013-03-20 VITALS — BP 170/101 | HR 90

## 2013-03-20 DIAGNOSIS — M216X9 Other acquired deformities of unspecified foot: Secondary | ICD-10-CM

## 2013-03-20 DIAGNOSIS — M25571 Pain in right ankle and joints of right foot: Secondary | ICD-10-CM

## 2013-03-20 DIAGNOSIS — M79609 Pain in unspecified limb: Secondary | ICD-10-CM

## 2013-03-20 MED ORDER — OXYCODONE-ACETAMINOPHEN 10-325 MG PO TABS: 1.0000 | ORAL_TABLET | Freq: Three times a day (TID) | ORAL | Status: DC | PRN

## 2013-03-20 NOTE — Patient Instructions (Addendum)
X-rays show good bone healing.  Bone Stimulator is working well. Continue with the stimulator for one more months. Return in 2 months.

## 2013-03-20 NOTE — Progress Notes (Signed)
Foot hurts only when in certain position. It does not hurt all the time.  Getting around with a cane. Wants to have handicap parking slip signed.  Still using stimulator. So far she used 3 months straight. She was able to tell the difference.  Blood sugar was is under control. Blood pressure was up.  No clinical swelling or specific pain.  Normal range of motion.  X-rays: Positive of good bone callus along the fusion site.  Assessment: Improvement with right foot pain. Complete fusion of the first MCJ following Lapidus fusion and use of bone stimulator for 3 months.   Plan: Will use one more month with the stimulator. Will follow up in two months.

## 2013-06-16 ENCOUNTER — Ambulatory Visit: Payer: Medicaid Other | Admitting: Podiatry

## 2013-06-22 ENCOUNTER — Encounter: Payer: Self-pay | Admitting: Podiatry

## 2013-06-22 ENCOUNTER — Ambulatory Visit (INDEPENDENT_AMBULATORY_CARE_PROVIDER_SITE_OTHER): Payer: Medicaid Other | Admitting: Podiatry

## 2013-06-22 VITALS — BP 161/97 | HR 117

## 2013-06-22 DIAGNOSIS — R600 Localized edema: Secondary | ICD-10-CM

## 2013-06-22 DIAGNOSIS — M216X9 Other acquired deformities of unspecified foot: Secondary | ICD-10-CM

## 2013-06-22 DIAGNOSIS — M79609 Pain in unspecified limb: Secondary | ICD-10-CM

## 2013-06-22 DIAGNOSIS — M25571 Pain in right ankle and joints of right foot: Secondary | ICD-10-CM

## 2013-06-22 MED ORDER — OXYCODONE-ACETAMINOPHEN 10-325 MG PO TABS: 1.0000 | ORAL_TABLET | Freq: Three times a day (TID) | ORAL | Status: DC | PRN

## 2013-06-22 NOTE — Progress Notes (Signed)
Stated that she did well holding off this far. Patient points right medial arch area being the sore area and pain goes up to the ankle.  This pain started 2-3 weeks ago. Has not done anything unusual. Usually sits a lot. Uses rolling chair when goes to store.  Still cannot wear shoe on the foot.  Pain was mild initially than it got worse. Had bone stimulator on for 2 month (August and September).   Objective: Subjective pain increased on right foot with ambulation ambulation.  No visible change. X-ray show no change from the previous X-ray. Still show questionable lucent line through the fusion area. Not clearly distinguishable from the 2nd MCJ line.   Assessment: Rule out delayed fusion.  Plan: Will keep in CAM walker and go back using bone stimulator.  Re-evaluate in one month.

## 2013-06-22 NOTE — Patient Instructions (Signed)
Seen for pain on right foot. X-ray taken no change from previous. Stay in CAM walker to get around. Return back to using bone stimulator. Return in 1 month.

## 2013-07-07 ENCOUNTER — Encounter (HOSPITAL_COMMUNITY): Payer: Self-pay | Admitting: Emergency Medicine

## 2013-07-07 ENCOUNTER — Emergency Department (HOSPITAL_COMMUNITY)
Admission: EM | Admit: 2013-07-07 | Discharge: 2013-07-07 | Disposition: A | Discharge status: Home / Self Care | Payer: Medicaid Other | Attending: Emergency Medicine | Admitting: Emergency Medicine

## 2013-07-07 ENCOUNTER — Emergency Department (HOSPITAL_COMMUNITY): Payer: Medicaid Other

## 2013-07-07 DIAGNOSIS — R52 Pain, unspecified: Secondary | ICD-10-CM | POA: Insufficient documentation

## 2013-07-07 DIAGNOSIS — F329 Major depressive disorder, single episode, unspecified: Secondary | ICD-10-CM | POA: Insufficient documentation

## 2013-07-07 DIAGNOSIS — R1013 Epigastric pain: Secondary | ICD-10-CM | POA: Insufficient documentation

## 2013-07-07 DIAGNOSIS — Z79899 Other long term (current) drug therapy: Secondary | ICD-10-CM | POA: Insufficient documentation

## 2013-07-07 DIAGNOSIS — R079 Chest pain, unspecified: Secondary | ICD-10-CM

## 2013-07-07 DIAGNOSIS — F4310 Post-traumatic stress disorder, unspecified: Secondary | ICD-10-CM | POA: Insufficient documentation

## 2013-07-07 DIAGNOSIS — I252 Old myocardial infarction: Secondary | ICD-10-CM | POA: Insufficient documentation

## 2013-07-07 DIAGNOSIS — R0789 Other chest pain: Secondary | ICD-10-CM | POA: Insufficient documentation

## 2013-07-07 DIAGNOSIS — J45901 Unspecified asthma with (acute) exacerbation: Secondary | ICD-10-CM | POA: Insufficient documentation

## 2013-07-07 DIAGNOSIS — I10 Essential (primary) hypertension: Secondary | ICD-10-CM | POA: Insufficient documentation

## 2013-07-07 DIAGNOSIS — F17200 Nicotine dependence, unspecified, uncomplicated: Secondary | ICD-10-CM | POA: Insufficient documentation

## 2013-07-07 DIAGNOSIS — R1110 Vomiting, unspecified: Secondary | ICD-10-CM | POA: Insufficient documentation

## 2013-07-07 DIAGNOSIS — Z794 Long term (current) use of insulin: Secondary | ICD-10-CM | POA: Insufficient documentation

## 2013-07-07 DIAGNOSIS — Z8739 Personal history of other diseases of the musculoskeletal system and connective tissue: Secondary | ICD-10-CM | POA: Insufficient documentation

## 2013-07-07 DIAGNOSIS — Z91040 Latex allergy status: Secondary | ICD-10-CM | POA: Insufficient documentation

## 2013-07-07 DIAGNOSIS — F3289 Other specified depressive episodes: Secondary | ICD-10-CM | POA: Insufficient documentation

## 2013-07-07 DIAGNOSIS — E119 Type 2 diabetes mellitus without complications: Secondary | ICD-10-CM | POA: Insufficient documentation

## 2013-07-07 DIAGNOSIS — R197 Diarrhea, unspecified: Secondary | ICD-10-CM | POA: Insufficient documentation

## 2013-07-07 LAB — COMPREHENSIVE METABOLIC PANEL
ALK PHOS: 92 U/L (ref 39–117)
ALT: 13 U/L (ref 0–35)
AST: 13 U/L (ref 0–37)
Albumin: 3.6 g/dL (ref 3.5–5.2)
BUN: 8 mg/dL (ref 6–23)
CALCIUM: 8.8 mg/dL (ref 8.4–10.5)
CO2: 23 mEq/L (ref 19–32)
CREATININE: 0.77 mg/dL (ref 0.50–1.10)
Chloride: 97 mEq/L (ref 96–112)
GFR calc non Af Amer: >90 mL/min (ref >90)
GLUCOSE: 307 mg/dL — AB (ref 70–99)
Potassium: 4.4 mEq/L (ref 3.7–5.3)
Sodium: 136 mEq/L — ABNORMAL LOW (ref 137–147)
TOTAL PROTEIN: 7.4 g/dL (ref 6.0–8.3)
Total Bilirubin: 0.4 mg/dL (ref 0.3–1.2)

## 2013-07-07 LAB — CBC
HEMATOCRIT: 42 % (ref 36.0–46.0)
Hemoglobin: 13.9 g/dL (ref 12.0–15.0)
MCH: 26.7 pg (ref 26.0–34.0)
MCHC: 33.1 g/dL (ref 30.0–36.0)
MCV: 80.8 fL (ref 78.0–100.0)
Platelets: 214 10*3/uL (ref 150–400)
RBC: 5.2 MIL/uL — ABNORMAL HIGH (ref 3.87–5.11)
RDW: 14.5 % (ref 11.5–15.5)
WBC: 5.4 10*3/uL (ref 4.0–10.5)

## 2013-07-07 LAB — POCT I-STAT TROPONIN I: TROPONIN I, POC: 0 ng/mL (ref 0.00–0.08)

## 2013-07-07 LAB — PRO B NATRIURETIC PEPTIDE: Pro B Natriuretic peptide (BNP): 9.6 pg/mL (ref 0–125)

## 2013-07-07 LAB — LIPASE, BLOOD: LIPASE: 59 U/L (ref 11–59)

## 2013-07-07 LAB — D-DIMER, QUANTITATIVE (NOT AT ARMC)

## 2013-07-07 MED ORDER — GI COCKTAIL ~~LOC~~
30.0000 mL | Freq: Once | ORAL | Status: AC
  Administered 2013-07-07: 30 mL via ORAL
  Filled 2013-07-07: qty 30

## 2013-07-07 MED ORDER — OMEPRAZOLE 20 MG PO CPDR: 20.0000 mg | DELAYED_RELEASE_CAPSULE | Freq: Every day | ORAL | Status: DC

## 2013-07-07 MED ORDER — FAMOTIDINE IN NACL 20-0.9 MG/50ML-% IV SOLN
20.0000 mg | Freq: Once | INTRAVENOUS | Status: AC
  Administered 2013-07-07: 20 mg via INTRAVENOUS
  Filled 2013-07-07: qty 50

## 2013-07-07 MED ORDER — SUCRALFATE 1 G PO TABS: 1.0000 g | ORAL_TABLET | Freq: Four times a day (QID) | ORAL | Status: DC

## 2013-07-07 MED ORDER — ONDANSETRON HCL 4 MG/2ML IJ SOLN
4.0000 mg | Freq: Once | INTRAMUSCULAR | Status: AC
  Administered 2013-07-07: 4 mg via INTRAVENOUS
  Filled 2013-07-07: qty 2

## 2013-07-07 NOTE — ED Provider Notes (Signed)
CSN: [id number]     Arrival date & time 07/07/13  6144 History   First MD Initiated Contact with Patient 07/07/13 0957     Chief Complaint  Patient presents with  . Chest Pain  . Shortness of Breath  . Emesis  . Diarrhea   HPI  Patient p/w chest and epigastric pain. Symptoms began within the past day. Since onset she said pain essentially from her epigastrium to her suprasternal notch.  The pain is burning, severe.  On the pain is worse with prone positioning.  There is associated nausea, generalized discomfort.  There is no vomiting, there is mild dyspnea. Pain is slightly worse with deep inspiration.  The patient has no history of thromboembolic disease.  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 No relief of the chest pain with anything.  No  Past Medical History  Diagnosis Date  . Diabetes mellitus   . Hypertension   . Myocardial infarction   . Asthma   . Hyperlipidemia   . Sleep apnea   . Depression   . PTSD (post-traumatic stress disorder)   . Pain in joint, ankle and foot 02/17/2013   Past Surgical History  Procedure Laterality Date  . Tubal ligation    . Midtarsal arthrodesis Right 07/17/12  . Remove implant deep Right 07/17/12  . Lapidus fusion Right 01/10/12  . Carpal tunnel release Left 2001    Left wrist  . Gastroc recession extremity Right 3//20/14    Right foot   No family history on file. History  Substance Use Topics  . Smoking status: Current Every Day Smoker    Types: Cigarettes  . Smokeless tobacco: Never Used  . Alcohol Use: No   OB History   Grav Para Term Preterm Abortions TAB SAB Ect Mult Living                 Review of Systems  Constitutional:       Per HPI, otherwise negative  HENT:       Per HPI, otherwise negative  Respiratory:       Per HPI, otherwise negative  Cardiovascular:       Per HPI, otherwise  negative  Gastrointestinal: Positive for nausea and abdominal pain. Negative for vomiting.  Endocrine:       Negative aside from HPI  Genitourinary:       Neg aside from HPI   Musculoskeletal:       Per HPI, otherwise negative  Skin: Negative.   Neurological: Negative for syncope.    Allergies  Celery oil and Latex  Home Medications   Current Outpatient Rx  Name  Route  Sig  Dispense  Refill  . EXPIRED: albuterol (PROVENTIL HFA;VENTOLIN HFA) 108 (90 BASE) MCG/ACT inhaler   Inhalation   Inhale 2 puffs into the lungs every 4 (four) hours as needed for wheezing or shortness of breath.   18 g   0   . EXPIRED: amLODipine (NORVASC) 10 MG tablet   Oral   Take 1 tablet (10 mg total) by mouth daily. For blood pressure control.   30 tablet   0   . atenolol (TENORMIN) 50 MG tablet   Oral   Take 50 mg by mouth daily.         . cyclobenzaprine (FLEXERIL) 10 MG tablet   Oral   Take 10 mg by mouth 3 (three) times  daily as needed for muscle spasms.          Marland Kitchen EXPIRED: DULoxetine (CYMBALTA) 30 MG capsule   Oral   Take 1 capsule (30 mg total) by mouth daily. For depression and pain.   30 capsule   0   . EXPIRED: gabapentin (NEURONTIN) 300 MG capsule   Oral   Take 1 capsule (300 mg total) by mouth 3 (three) times daily at 8am, 2pm and bedtime. For anxiety, pain and mood stabilization.   90 capsule   0   . hydrOXYzine (ATARAX/VISTARIL) 50 MG tablet   Oral   Take 50 mg by mouth 3 (three) times daily as needed for anxiety.          . insulin glargine (LANTUS) 100 UNIT/ML injection   Subcutaneous   Inject 25 Units into the skin daily.          Marland Kitchen ketoprofen (ORUDIS) 75 MG capsule   Oral   Take 75 mg by mouth 3 (three) times daily as needed for pain (pain).         Marland Kitchen lidocaine (LIDODERM) 5 %   Transdermal   Place 1 patch onto the skin daily. Remove & Discard patch within 12 hours or as directed by MD   30 patch   0   . lisinopril (PRINIVIL,ZESTRIL) 20 MG tablet    Oral   Take 20 mg by mouth daily.         Marland Kitchen LORazepam (ATIVAN) 1 MG tablet   Oral   Take 1 tablet (1 mg total) by mouth 3 (three) times daily as needed for anxiety.   15 tablet   0   . metFORMIN (GLUCOPHAGE) 850 MG tablet   Oral   Take 850 mg by mouth 2 (two) times daily with a meal.         . oxyCODONE-acetaminophen (PERCOCET) 10-325 MG per tablet   Oral   Take 1 tablet by mouth every 8 (eight) hours as needed for pain.   90 tablet   0   . risperiDONE (RISPERDAL) 1 MG tablet   Oral   Take 2 mg by mouth at bedtime.          Marland Kitchen tiotropium (SPIRIVA) 18 MCG inhalation capsule   Inhalation   Place 18 mcg into inhaler and inhale daily.          BP 143/75  Pulse 81  Temp(Src) 98.2 F (36.8 C) (Oral)  Resp 19  Wt 279 lb (126.554 kg)  SpO2 100%  LMP 07/02/2013 Physical Exam  Nursing note and vitals reviewed. Constitutional: She is oriented to person, place, and time. She appears well-developed and well-nourished. No distress.  HENT:  Head: Normocephalic and atraumatic.  Eyes: Conjunctivae and EOM are normal.  Cardiovascular: Normal rate and regular rhythm.   Pulmonary/Chest: Effort normal and breath sounds normal. No stridor. No respiratory distress.    Abdominal: She exhibits no distension. There is tenderness in the epigastric area.  Musculoskeletal: She exhibits no edema.       Feet:  Neurological: She is alert and oriented to person, place, and time. No cranial nerve deficit.  Skin: Skin is warm and dry.  Psychiatric: She has a normal mood and affect.    ED Course  Procedures (including critical care time) Labs Review Labs Reviewed  CBC  PRO B NATRIURETIC PEPTIDE  COMPREHENSIVE METABOLIC PANEL  D-DIMER, QUANTITATIVE  LIPASE, BLOOD  POCT I-STAT TROPONIN I   Imaging Review Dg Chest 2 View  07/07/2013   CLINICAL DATA:  Chest pain and shortness of breath  EXAM: CHEST  2 VIEW  COMPARISON:  Nov 03, 2012 chest radiograph and chest CT November 27, 2012   FINDINGS: Lungs are clear. Heart size and pulmonary vascularity are normal. No adenopathy. No pneumothorax. There is degenerative change in the thoracic spine.  IMPRESSION: No edema or consolidation.   Electronically Signed   By: Lowella Grip M.D.   On: 07/07/2013 10:05    EKG Interpretation    Date/Time:  Tuesday July 07 2013 09:11:42 EST Ventricular Rate:  88 PR Interval:  144 QRS Duration: 68 QT Interval:  384 QTC Calculation: 464 R Axis:   92 Text Interpretation:  Normal sinus rhythm Rightward axis Borderline ECG Sinus rhythm No significant change since last tracing Rightward axis Abnormal ekg Confirmed by Carmin Muskrat  MD [phone]) on 07/07/2013 11:01:59 AM           1:17 PM Patient in no distress, sitting upright, stable vital signs She and family members are aware of all results, the need for followup, the likelihood of GI etiology. MDM   1. Chest pain    Patient presents with ongoing epigastric pain.  On exam she is awake alert and hemodynamically stable, in no distress.  Patient's the pain is worse with supine positioning, reassuring labs, absence of distress were reassuring for the low suspicion of ongoing coronary ischemia.  The patient was discharged in stable condition to follow up with primary care after initiation of therapy.    Carmin Muskrat, MD 07/07/13 1318

## 2013-07-07 NOTE — ED Notes (Signed)
PT ambulated with baseline gait; VSS; A&Ox3; no signs of distress; respirations even and unlabored; skin warm and dry; no questions upon discharge.  

## 2013-07-07 NOTE — ED Notes (Signed)
Pharmacy at bedside

## 2013-07-07 NOTE — Discharge Instructions (Signed)
As discussed, it is important that you follow up as soon as possible with your physician for continued management of your condition. ° °If you develop any new, or concerning changes in your condition, please return to the emergency department immediately. ° °Chest Pain (Nonspecific) °It is often hard to give a specific diagnosis for the cause of chest pain. There is always a chance that your pain could be related to something serious, such as a heart attack or a blood clot in the lungs. You need to follow up with your caregiver for further evaluation. °CAUSES  °· Heartburn. °· Pneumonia or bronchitis. °· Anxiety or stress. °· Inflammation around your heart (pericarditis) or lung (pleuritis or pleurisy). °· A blood clot in the lung. °· A collapsed lung (pneumothorax). It can develop suddenly on its own (spontaneous pneumothorax) or from injury (trauma) to the chest. °· Shingles infection (herpes zoster virus). °The chest wall is composed of bones, muscles, and cartilage. Any of these can be the source of the pain. °· The bones can be bruised by injury. °· The muscles or cartilage can be strained by coughing or overwork. °· The cartilage can be affected by inflammation and become sore (costochondritis). °DIAGNOSIS  °Lab tests or other studies, such as X-rays, electrocardiography, stress testing, or cardiac imaging, may be needed to find the cause of your pain.  °TREATMENT  °· Treatment depends on what may be causing your chest pain. Treatment may include: °· Acid blockers for heartburn. °· Anti-inflammatory medicine. °· Pain medicine for inflammatory conditions. °· Antibiotics if an infection is present. °· You may be advised to change lifestyle habits. This includes stopping smoking and avoiding alcohol, caffeine, and chocolate. °· You may be advised to keep your head raised (elevated) when sleeping. This reduces the chance of acid going backward from your stomach into your esophagus. °· Most of the time, nonspecific  chest pain will improve within 2 to 3 days with rest and mild pain medicine. °HOME CARE INSTRUCTIONS  °· If antibiotics were prescribed, take your antibiotics as directed. Finish them even if you start to feel better. °· For the next few days, avoid physical activities that bring on chest pain. Continue physical activities as directed. °· Do not smoke. °· Avoid drinking alcohol. °· Only take over-the-counter or prescription medicine for pain, discomfort, or fever as directed by your caregiver. °· Follow your caregiver's suggestions for further testing if your chest pain does not go away. °· Keep any follow-up appointments you made. If you do not go to an appointment, you could develop lasting (chronic) problems with pain. If there is any problem keeping an appointment, you must call to reschedule. °SEEK MEDICAL CARE IF:  °· You think you are having problems from the medicine you are taking. Read your medicine instructions carefully. °· Your chest pain does not go away, even after treatment. °· You develop a rash with blisters on your chest. °SEEK IMMEDIATE MEDICAL CARE IF:  °· You have increased chest pain or pain that spreads to your arm, neck, jaw, back, or abdomen. °· You develop shortness of breath, an increasing cough, or you are coughing up blood. °· You have severe back or abdominal pain, feel nauseous, or vomit. °· You develop severe weakness, fainting, or chills. °· You have a fever. °THIS IS AN EMERGENCY. Do not wait to see if the pain will go away. Get medical help at once. Call your local emergency services (911 in [place] Do not drive yourself to the hospital. °  the hospital. MAKE SURE YOU:   Understand these instructions.  Will watch your condition.  Will get help right away if you are not doing well or get worse. Document Released: 03/14/2005 Document Revised: 08/27/2011 Document Reviewed: 01/08/2008 [facility] Patient Information 2014 [place].

## 2013-07-07 NOTE — ED Notes (Signed)
Pt states since last nite has mid epigastric pain, vomiting, diarrhea, sob.

## 2013-07-24 ENCOUNTER — Ambulatory Visit (INDEPENDENT_AMBULATORY_CARE_PROVIDER_SITE_OTHER): Payer: Medicaid Other | Admitting: Podiatry

## 2013-07-24 ENCOUNTER — Encounter: Payer: Self-pay | Admitting: Podiatry

## 2013-07-24 VITALS — BP 136/76 | HR 74

## 2013-07-24 DIAGNOSIS — M216X9 Other acquired deformities of unspecified foot: Secondary | ICD-10-CM

## 2013-07-24 DIAGNOSIS — M25579 Pain in unspecified ankle and joints of unspecified foot: Secondary | ICD-10-CM

## 2013-07-24 DIAGNOSIS — Z9889 Other specified postprocedural states: Secondary | ICD-10-CM

## 2013-07-24 DIAGNOSIS — M79609 Pain in unspecified limb: Secondary | ICD-10-CM

## 2013-07-24 MED ORDER — OXYCODONE-ACETAMINOPHEN 10-325 MG PO TABS: 1.0000 | ORAL_TABLET | Freq: Three times a day (TID) | ORAL | Status: DC | PRN

## 2013-07-24 NOTE — Patient Instructions (Signed)
Seen for pain in right foot at surgical area. Will have CT scan done to determine if the fusion has failed. Will have referral to Radiology.

## 2013-07-24 NOTE — Progress Notes (Signed)
HPI: First Metatarsocuneiform fusion with two screw fixation in 01/10/2012. After 6 month post op, the area was determined to be failed to fuse.  Second MCJ fusion done with plate and screws in 9/52/8413. After 6 month post op, the area was suspected to have failed again showing no evidence of solid bone fusion at the site. Patient was placed in bone stimulator in late July or early August, but patient stopped using after about a month.  On March 20, 2013 patient was informed to stay in bone stimulator for the minimum of 3 months.  On her last January visit patient was advised to go back using bone stimulator after seeing no change in fusion site in X-ray finding.  Radiographic examination done in January 2015, it failed to show definitive sign of solid fusion. Noted of no bony trabeculation across fusion site.  Assessment: Possible failed fusion first Metatarsocuneiform joint after using bone stimulator.  Plan: Will order CT scan to determine condition of fusion site. Reviewed the above findings with patient.

## 2013-07-31 ENCOUNTER — Ambulatory Visit
Admission: RE | Admit: 2013-07-31 | Discharge: 2013-07-31 | Disposition: A | Discharge status: Home / Self Care | Payer: Medicaid Other | Source: Ambulatory Visit | Attending: Podiatry | Admitting: Podiatry

## 2013-08-17 ENCOUNTER — Ambulatory Visit: Payer: Medicaid Other | Admitting: Podiatry

## 2013-08-17 ENCOUNTER — Ambulatory Visit (INDEPENDENT_AMBULATORY_CARE_PROVIDER_SITE_OTHER): Payer: Medicaid Other | Admitting: Podiatry

## 2013-08-17 ENCOUNTER — Encounter: Payer: Self-pay | Admitting: Podiatry

## 2013-08-17 VITALS — BP 168/111 | HR 87

## 2013-08-17 DIAGNOSIS — M79609 Pain in unspecified limb: Secondary | ICD-10-CM

## 2013-08-17 DIAGNOSIS — M25579 Pain in unspecified ankle and joints of unspecified foot: Secondary | ICD-10-CM

## 2013-08-17 DIAGNOSIS — M216X9 Other acquired deformities of unspecified foot: Secondary | ICD-10-CM

## 2013-08-17 DIAGNOSIS — M659 Synovitis and tenosynovitis, unspecified: Secondary | ICD-10-CM | POA: Insufficient documentation

## 2013-08-17 MED ORDER — OXYCODONE-ACETAMINOPHEN 10-325 MG PO TABS: 1.0000 | ORAL_TABLET | Freq: Three times a day (TID) | ORAL | Status: DC | PRN

## 2013-08-17 NOTE — Patient Instructions (Signed)
CT scan revealed fused bone. Continue with bone stimulator for next 2-3 months longer since it is making the foot feel better.  May use pain pills as needed. Return as needed.

## 2013-08-17 NOTE — Progress Notes (Signed)
Subjective: S/P Lapidus fusion one year and 2 month right foot.  Reviewed CT result that revealed the area in question is fused.  Patient continues to have pain in instep area upon weight bearing and have difficulty wearing regular shoe gear. Using Bone stimulator since last visit. Stated that it gives her less pain while it is on her foot.   Objective: No visible abnormal findings on right foot and ankle. Subjective pain in instep and at the post surgical site with weight bearing.    Assessment: Status post Lapidus fusion right (January 2014) still having pain around the surgical site.  R/O Intolerance to hard wares, screws and plate.  Tenosynovitis mid foot at instep area.   Plan: Reviewed available options including 2nd opinion. Continue to use Bone stimulator since it is helping her to have less pain. Try to wear regular shoe gear gradually with incremental steps.  Analgesics as needed.

## 2013-08-30 ENCOUNTER — Encounter (HOSPITAL_COMMUNITY): Payer: Self-pay | Admitting: Emergency Medicine

## 2013-08-30 ENCOUNTER — Emergency Department (HOSPITAL_COMMUNITY)
Admission: EM | Admit: 2013-08-30 | Discharge: 2013-08-30 | Disposition: A | Discharge status: Hospice | Payer: Medicaid Other | Source: Home / Self Care | Attending: Family Medicine | Admitting: Family Medicine

## 2013-08-30 DIAGNOSIS — K5900 Constipation, unspecified: Secondary | ICD-10-CM

## 2013-08-30 MED ORDER — PEG 3350-KCL-NA BICARB-NACL 420 G PO SOLR: 4000.0000 mL | Freq: Once | ORAL | Status: DC

## 2013-08-30 NOTE — ED Notes (Signed)
C/o constipation for two weeks Has tried miralax but no relief. States she does have rectal pain and abd pain States abd pain starts on LLQ and radiates to the RLQ

## 2013-08-30 NOTE — ED Provider Notes (Signed)
CSN: [id number]     Arrival date & time 08/30/13  1332 History   First MD Initiated Contact with Patient 08/30/13 1407     Chief Complaint  Patient presents with  . Constipation   (Consider location/radiation/quality/duration/timing/severity/associated sxs/prior Treatment) Patient is a 43 y.o. female presenting with constipation. The history is provided by the patient.  Constipation Severity:  Mild Time since last bowel movement:  2 weeks Chronicity:  New Stool description:  None produced Worsened by:  Nothing tried Ineffective treatments:  Miralax Associated symptoms: abdominal pain   Associated symptoms: no back pain, no diarrhea, no fever, no nausea and no vomiting     Past Medical History  Diagnosis Date  . Diabetes mellitus   . Hypertension   . Myocardial infarction   . Asthma   . Hyperlipidemia   . Sleep apnea   . Depression   . PTSD (post-traumatic stress disorder)   . Pain in joint, ankle and foot 02/17/2013   Past Surgical History  Procedure Laterality Date  . Tubal ligation    . Midtarsal arthrodesis Right 07/17/12  . Remove implant deep Right 07/17/12  . Lapidus fusion Right 01/10/12  . Carpal tunnel release Left 2001    Left wrist  . Gastroc recession extremity Right 3//20/14    Right foot   History reviewed. No pertinent family history. History  Substance Use Topics  . Smoking status: Former Smoker    Types: Cigarettes    Quit date: 07/03/2013  . Smokeless tobacco: Never Used  . Alcohol Use: No   OB History   Grav Para Term Preterm Abortions TAB SAB Ect Mult Living                 Review of Systems  Constitutional: Negative.  Negative for fever.  Gastrointestinal: Positive for abdominal pain, constipation and anal bleeding. Negative for nausea, vomiting, diarrhea and rectal pain.  Musculoskeletal: Negative for back pain.    Allergies  Celery oil and Latex  Home Medications   Current Outpatient Rx  Name  Route  Sig  Dispense  Refill  .  albuterol (PROVENTIL HFA;VENTOLIN HFA) 108 (90 BASE) MCG/ACT inhaler   Inhalation   Inhale 2 puffs into the lungs every 4 (four) hours as needed for wheezing or shortness of breath.         Marland Kitchen amLODipine (NORVASC) 10 MG tablet   Oral   Take 10 mg by mouth daily.         Marland Kitchen atenolol (TENORMIN) 50 MG tablet   Oral   Take 50 mg by mouth daily.         . Canagliflozin (INVOKANA) 300 MG TABS   Oral   Take 300 mg by mouth daily.         . cyclobenzaprine (FLEXERIL) 10 MG tablet   Oral   Take 10 mg by mouth 3 (three) times daily as needed for muscle spasms.          . DULoxetine (CYMBALTA) 60 MG capsule   Oral   Take 60 mg by mouth daily.         . hydrOXYzine (ATARAX/VISTARIL) 50 MG tablet   Oral   Take 50 mg by mouth 3 (three) times daily as needed for anxiety.          . insulin glargine (LANTUS) 100 UNIT/ML injection   Subcutaneous   Inject 25 Units into the skin at bedtime.          . insulin NPH-regular  Human (NOVOLIN 70/30) (70-30) 100 UNIT/ML injection   Subcutaneous   Inject 30 Units into the skin 2 (two) times daily with a meal.         . ketoprofen (ORUDIS) 75 MG capsule   Oral   Take 75 mg by mouth 3 (three) times daily as needed for pain (pain).         Marland Kitchen lisinopril (PRINIVIL,ZESTRIL) 20 MG tablet   Oral   Take 20 mg by mouth daily.         Marland Kitchen LORazepam (ATIVAN) 1 MG tablet   Oral   Take 1 tablet (1 mg total) by mouth 3 (three) times daily as needed for anxiety.   15 tablet   0   . omeprazole (PRILOSEC) 20 MG capsule   Oral   Take 1 capsule (20 mg total) by mouth daily.   21 capsule   0   . oxyCODONE-acetaminophen (PERCOCET) 10-325 MG per tablet   Oral   Take 1 tablet by mouth every 8 (eight) hours as needed for pain.   90 tablet   0   . polyethylene glycol-electrolytes (NULYTELY/GOLYTELY) 420 G solution   Oral   Take 4,000 mLs by mouth once.   4000 mL   0   . risperiDONE (RISPERDAL) 2 MG tablet   Oral   Take 2 mg by mouth  at bedtime.         . sucralfate (CARAFATE) 1 G tablet   Oral   Take 1 tablet (1 g total) by mouth 4 (four) times daily.   28 tablet   0   . tiotropium (SPIRIVA) 18 MCG inhalation capsule   Inhalation   Place 18 mcg into inhaler and inhale daily.          BP 144/84  Pulse 80  Temp(Src) 99.1 F (37.3 C) (Oral)  Resp 18  SpO2 100%  LMP 07/26/2013 Physical Exam  Nursing note and vitals reviewed. Constitutional: She is oriented to person, place, and time. She appears well-developed and well-nourished.  HENT:  Mouth/Throat: Oropharynx is clear and moist.  Abdominal: Soft. Bowel sounds are normal. She exhibits no distension and no mass. There is no splenomegaly or hepatomegaly. There is tenderness in the right lower quadrant, suprapubic area and left lower quadrant. There is no rigidity, no rebound, no guarding, no CVA tenderness, no tenderness at McBurney's point and negative Murphy's sign.  Neurological: She is alert and oriented to person, place, and time.  Skin: Skin is warm and dry.    ED Course  Procedures (including critical care time) Labs Review Labs Reviewed - No data to display Imaging Review No results found.   MDM   1. Acute constipation        Billy Fischer, MD 08/30/13 2011

## 2013-09-22 ENCOUNTER — Encounter: Payer: Self-pay | Admitting: Podiatry

## 2013-09-22 ENCOUNTER — Ambulatory Visit (INDEPENDENT_AMBULATORY_CARE_PROVIDER_SITE_OTHER): Payer: Medicaid Other | Admitting: Podiatry

## 2013-09-22 VITALS — BP 157/94 | HR 88

## 2013-09-22 DIAGNOSIS — M79609 Pain in unspecified limb: Secondary | ICD-10-CM

## 2013-09-22 DIAGNOSIS — M7660 Achilles tendinitis, unspecified leg: Secondary | ICD-10-CM

## 2013-09-22 DIAGNOSIS — M659 Synovitis and tenosynovitis, unspecified: Secondary | ICD-10-CM

## 2013-09-22 DIAGNOSIS — R609 Edema, unspecified: Secondary | ICD-10-CM

## 2013-09-22 DIAGNOSIS — R600 Localized edema: Secondary | ICD-10-CM

## 2013-09-22 MED ORDER — OXYCODONE-ACETAMINOPHEN 10-325 MG PO TABS: 1.0000 | ORAL_TABLET | Freq: Three times a day (TID) | ORAL | Status: DC | PRN

## 2013-09-22 NOTE — Progress Notes (Signed)
Subjective: Swelling on lateral foot and ankle. Now pain has expanded to center bottom of foot.  Stated that she walks minimum and uses motor cart in grocery store. Her foot tend to give out when she walks long time. Been back for a month using stimulator.  The CT scan result was previously informed to patient. The report stated that the top 1/2 of the surface was fused. Uses pain pills about two times a day.   Objective: Tender through out right foot from fore foot to ankle when attempted go through range of motion. Much pain at the ankle joint, forefoot, midfoot and arch. Mild ankle edema on lateral aspect. Positive of forefoot varus.  Assessment: Status post Lapidus fusion. Fail to recover from post op pain. Chronic pain and swelling right foot and ankle. Tenosynovitis foot and ankle right.  Plan:  Reviewed findings. Discussed benefit of 2nd opinion.  Will refer out upon request.

## 2013-09-22 NOTE — Patient Instructions (Signed)
Seen for pain in right foot.  Continue with bone stimulator if it makes the foot feel better. Ok to try regular shoes. Will send for 2nd opinion if requested.  Pain medication re ordered.

## 2013-10-08 ENCOUNTER — Encounter: Payer: Self-pay | Admitting: *Deleted

## 2013-10-23 ENCOUNTER — Encounter: Payer: Self-pay | Admitting: Podiatry

## 2013-10-23 ENCOUNTER — Ambulatory Visit (INDEPENDENT_AMBULATORY_CARE_PROVIDER_SITE_OTHER): Payer: Medicaid Other | Admitting: Podiatry

## 2013-10-23 VITALS — BP 135/82 | HR 87

## 2013-10-23 DIAGNOSIS — S99919A Unspecified injury of unspecified ankle, initial encounter: Secondary | ICD-10-CM

## 2013-10-23 DIAGNOSIS — S8990XA Unspecified injury of unspecified lower leg, initial encounter: Secondary | ICD-10-CM

## 2013-10-23 DIAGNOSIS — R600 Localized edema: Secondary | ICD-10-CM

## 2013-10-23 DIAGNOSIS — M79609 Pain in unspecified limb: Secondary | ICD-10-CM

## 2013-10-23 DIAGNOSIS — S90921A Unspecified superficial injury of right foot, initial encounter: Secondary | ICD-10-CM

## 2013-10-23 DIAGNOSIS — S99929A Unspecified injury of unspecified foot, initial encounter: Secondary | ICD-10-CM

## 2013-10-23 DIAGNOSIS — R609 Edema, unspecified: Secondary | ICD-10-CM

## 2013-10-23 DIAGNOSIS — M25579 Pain in unspecified ankle and joints of unspecified foot: Secondary | ICD-10-CM

## 2013-10-23 MED ORDER — OXYCODONE-ACETAMINOPHEN 10-325 MG PO TABS: 1.0000 | ORAL_TABLET | Freq: Three times a day (TID) | ORAL | Status: DC | PRN

## 2013-10-23 NOTE — Progress Notes (Signed)
Subjective:  43 year old female presents stating that a brick fell on top of right foot near at old surgery site 3 days ago and is still painful.  Walks with cane because of poor balance. Foot swells inside tennis shoe and hurts. So wears surgical shoes.  Objective: Most pain is over old incision and medial aspect right foot.  Right lateral nkle edema is minimum.  No visible discoloration or edema noted on right foot.  Radiographic examination reveal no change from old X-ray. Positive of bone callus over fusion site at the old 1st  MCJ right. All fixation plate and screws are intact.  Pain and swelling with ambulation right foot.  Assessment:  Status post Lapidus fusion.  New trauma to right foot from fallen brick without acute damage to osseous structure. Difficulty walking with poor balance.   Plan:  Reviewed findings.  Continue to walk in surgical shoe with aid of cane.  Pain medication as needed.  Monitor monthly.

## 2013-10-23 NOTE — Patient Instructions (Signed)
Seen for pain on right foot. Injured with a brick. X-ray taken. No acute soft tissue or bony injury noted. Return as needed.

## 2013-10-28 ENCOUNTER — Encounter (INDEPENDENT_AMBULATORY_CARE_PROVIDER_SITE_OTHER): Payer: Self-pay | Admitting: General Surgery

## 2013-10-28 ENCOUNTER — Ambulatory Visit (INDEPENDENT_AMBULATORY_CARE_PROVIDER_SITE_OTHER): Payer: Medicaid Other | Admitting: General Surgery

## 2013-10-28 VITALS — BP 144/100 | HR 80 | Temp 97.9°F | Resp 16 | Ht 66.0 in | Wt 300.6 lb

## 2013-10-28 DIAGNOSIS — R109 Unspecified abdominal pain: Secondary | ICD-10-CM

## 2013-10-28 NOTE — Patient Instructions (Signed)
Will get CT of abdomen.  

## 2013-10-28 NOTE — Progress Notes (Signed)
Patient ID: Diana Henderson, female   DOB: [birth year], 43 y.o.   MRN: [id number]  Chief Complaint  Patient presents with  . New Evaluation    eval knot in navel area/painful    HPI Diana Henderson is a 43 y.o. female.  We're asked to see the patient in consultation by Dr. Vista Lawman to evaluate her for her abdominal pain. The patient is a 43 year old black female who has a history of umbilical hernia repair done in [place] in 1996. Over the last several months she has felt what she describes as a knot centrally in her abdomen. She has had some occasional nausea associated with it but no vomiting. Her appetite has been good and her bowels are working normally. She also reports having a sudden pain in her left chest with shortness of breath. She does have a history of myocardial infarction.  HPI  Past Medical History  Diagnosis Date  . Diabetes mellitus   . Hypertension   . Myocardial infarction   . Asthma   . Hyperlipidemia   . Sleep apnea   . Depression   . PTSD (post-traumatic stress disorder)   . Pain in joint, ankle and foot 02/17/2013    Past Surgical History  Procedure Laterality Date  . Tubal ligation    . Midtarsal arthrodesis Right 07/17/12  . Remove implant deep Right 07/17/12  . Lapidus fusion Right 01/10/12  . Carpal tunnel release Left 2001    Left wrist  . Gastroc recession extremity Right 3//20/14    Right foot  . Hernia repair      1996    History reviewed. No pertinent family history.  Social History History  Substance Use Topics  . Smoking status: Former Smoker    Types: Cigarettes    Quit date: 07/03/2013  . Smokeless tobacco: Never Used  . Alcohol Use: No    Allergies  Allergen Reactions  . Celery Oil Hives  . Latex Itching    Powdered latex gloves.      Current Outpatient Prescriptions  Medication Sig Dispense Refill  . albuterol (PROVENTIL HFA;VENTOLIN HFA) 108 (90 BASE) MCG/ACT inhaler Inhale 2 puffs into the lungs every 4 (four)  hours as needed for wheezing or shortness of breath.      Marland Kitchen amLODipine (NORVASC) 10 MG tablet Take 10 mg by mouth daily.      Marland Kitchen atenolol (TENORMIN) 50 MG tablet Take 50 mg by mouth daily.      . Canagliflozin (INVOKANA) 300 MG TABS Take 300 mg by mouth daily.      . cyclobenzaprine (FLEXERIL) 10 MG tablet Take 10 mg by mouth 3 (three) times daily as needed for muscle spasms.       . DULoxetine (CYMBALTA) 60 MG capsule Take 60 mg by mouth daily.      . hydrOXYzine (ATARAX/VISTARIL) 50 MG tablet Take 50 mg by mouth 3 (three) times daily as needed for anxiety.       . insulin glargine (LANTUS) 100 UNIT/ML injection Inject 25 Units into the skin at bedtime.       . insulin NPH-regular Human (NOVOLIN 70/30) (70-30) 100 UNIT/ML injection Inject 30 Units into the skin 2 (two) times daily with a meal.      . ketoprofen (ORUDIS) 75 MG capsule Take 75 mg by mouth 3 (three) times daily as needed for pain (pain).      Marland Kitchen lisinopril (PRINIVIL,ZESTRIL) 20 MG tablet Take 20 mg by mouth daily.      Marland Kitchen LORazepam (  ATIVAN) 1 MG tablet Take 1 tablet (1 mg total) by mouth 3 (three) times daily as needed for anxiety.  15 tablet  0  . omeprazole (PRILOSEC) 20 MG capsule Take 1 capsule (20 mg total) by mouth daily.  21 capsule  0  . oxyCODONE-acetaminophen (PERCOCET) 10-325 MG per tablet Take 1 tablet by mouth every 8 (eight) hours as needed for pain.  90 tablet  0  . polyethylene glycol-electrolytes (NULYTELY/GOLYTELY) 420 G solution Take 4,000 mLs by mouth once.  4000 mL  0  . risperiDONE (RISPERDAL) 2 MG tablet Take 2 mg by mouth at bedtime.      . sucralfate (CARAFATE) 1 G tablet Take 1 tablet (1 g total) by mouth 4 (four) times daily.  28 tablet  0  . tiotropium (SPIRIVA) 18 MCG inhalation capsule Place 18 mcg into inhaler and inhale daily.       No current facility-administered medications for this visit.    Review of Systems Review of Systems  Constitutional: Negative.   HENT: Negative.   Eyes: Negative.    Respiratory: Positive for chest tightness and shortness of breath.   Cardiovascular: Positive for chest pain.  Gastrointestinal: Positive for nausea and abdominal pain.  Endocrine: Negative.   Genitourinary: Negative.   Musculoskeletal: Negative.   Skin: Negative.   Allergic/Immunologic: Negative.   Neurological: Negative.   Hematological: Negative.   Psychiatric/Behavioral: Negative.     Blood pressure 144/100, pulse 80, temperature 97.9 F (36.6 C), temperature source Temporal, resp. rate 16, height 5\' 6"  (1.676 m), weight 300 lb 9.6 oz (136.351 kg).  Physical Exam Physical Exam  Constitutional: She is oriented to person, place, and time.  Obese black female in no acute distress  HENT:  Head: Normocephalic and atraumatic.  Eyes: Conjunctivae and EOM are normal. Pupils are equal, round, and reactive to light.  Neck: Normal range of motion. Neck supple.  Cardiovascular: Normal rate, regular rhythm and normal heart sounds.   Pulmonary/Chest: Effort normal and breath sounds normal.  Abdominal: Soft. Bowel sounds are normal.  She has a well-healed midline scar near the umbilicus. She is morbidly obese so it is very difficult to feel if there is a mass in this area or any fascial defect. She has some mild to moderate tenderness diffusely around her abdomen but no peritonitis  Musculoskeletal: Normal range of motion.  Lymphadenopathy:    She has no cervical adenopathy.  Neurological: She is alert and oriented to person, place, and time.  Skin: Skin is warm and dry.  Psychiatric: She has a normal mood and affect. Her behavior is normal.    Data Reviewed As above  Assessment    The patient has some abdominal pain that is nonspecific and she is too obese to appreciate a definite hernia clinically. Because of this I think she would benefit from a CT scan of her abdomen and pelvis to look for any radiographic evidence of a hernia. If she has a hernia then she may benefit from having  this fixed. The because of her cardiac history she would require cardiac clearance prior to scheduling this.     Plan    Plan for CT scan of her abdomen and pelvis. She will follow up with [place] in a couple weeks to go over the results of the study and then proceed accordingly        Luella Cook III 10/28/2013, 4:50 PM

## 2013-11-16 ENCOUNTER — Encounter (INDEPENDENT_AMBULATORY_CARE_PROVIDER_SITE_OTHER): Payer: Medicaid Other | Admitting: General Surgery

## 2013-11-30 ENCOUNTER — Encounter (INDEPENDENT_AMBULATORY_CARE_PROVIDER_SITE_OTHER): Payer: Self-pay | Admitting: General Surgery

## 2013-11-30 ENCOUNTER — Ambulatory Visit (INDEPENDENT_AMBULATORY_CARE_PROVIDER_SITE_OTHER): Payer: Medicaid Other | Admitting: General Surgery

## 2013-11-30 ENCOUNTER — Telehealth (INDEPENDENT_AMBULATORY_CARE_PROVIDER_SITE_OTHER): Payer: Self-pay | Admitting: General Surgery

## 2013-11-30 VITALS — BP 134/76 | HR 80 | Temp 97.8°F | Ht 66.0 in | Wt 297.0 lb

## 2013-11-30 DIAGNOSIS — R109 Unspecified abdominal pain: Secondary | ICD-10-CM

## 2013-11-30 NOTE — Patient Instructions (Signed)
Will call with results of CT 

## 2013-11-30 NOTE — Progress Notes (Signed)
Subjective:     Patient ID: Diana Henderson, female   DOB: 1971/03/05, 43 y.o.   MRN: [id number]  HPI The patient is a 43 year old black female who we saw recently with some central abdominal pain. Since her last visit she has had a couple episodes of nausea and vomiting. Her appetite is otherwise good and her bowels are working normally. She denies any fevers or chills.  Review of Systems  Constitutional: Negative.   HENT: Negative.   Eyes: Negative.   Respiratory: Negative.   Cardiovascular: Negative.   Gastrointestinal: Positive for nausea, vomiting and abdominal pain.  Endocrine: Negative.   Genitourinary: Negative.   Musculoskeletal: Negative.   Skin: Negative.   Allergic/Immunologic: Negative.   Neurological: Negative.   Hematological: Negative.   Psychiatric/Behavioral: Negative.        Objective:   Physical Exam  Constitutional: She is oriented to person, place, and time. She appears well-developed and well-nourished.  HENT:  Head: Normocephalic and atraumatic.  Eyes: Conjunctivae and EOM are normal. Pupils are equal, round, and reactive to light.  Neck: Normal range of motion. Neck supple.  Cardiovascular: Normal rate, regular rhythm and normal heart sounds.   Pulmonary/Chest: Effort normal and breath sounds normal.  Abdominal: Soft. Bowel sounds are normal.  There is some tenderness centrally around her old scar. I do not palpate a definite mass or fascial defect at the site. There is no abdominal distention or sign of obstruction  Musculoskeletal: Normal range of motion.  Lymphadenopathy:    She has no cervical adenopathy.  Neurological: She is alert and oriented to person, place, and time.  Skin: Skin is warm and dry.  Psychiatric: She has a normal mood and affect. Her behavior is normal.       Assessment:     The patient has persistent central Abdominal pain. Because of her size it is difficult to tell by physical exam whether she has a hernia or not.      Plan:     At this point we will try to obtain the CT scan of her abdomen and pelvis that we ordered last time. We will call her with the results of the study and then proceed accordingly.

## 2013-11-30 NOTE — Telephone Encounter (Signed)
LMOM asking pt to return my call.  This is d/t her appt today which was meant to go over the CT scan.  She did not have this CT scan done so I wanted to see if she was symptomatic or if we could reschedule the appt for another day so we can try and reschedule her CT scan beforehand.

## 2013-11-30 NOTE — Addendum Note (Signed)
Addended byFlossie Buffy on: 11/30/2013 01:49 PM   Modules accepted: Orders

## 2013-12-03 ENCOUNTER — Emergency Department (HOSPITAL_COMMUNITY): Payer: Medicaid Other

## 2013-12-03 ENCOUNTER — Encounter (HOSPITAL_COMMUNITY): Payer: Self-pay | Admitting: Emergency Medicine

## 2013-12-03 ENCOUNTER — Emergency Department (HOSPITAL_COMMUNITY)
Admission: EM | Admit: 2013-12-03 | Discharge: 2013-12-03 | Disposition: A | Discharge status: Home / Self Care | Payer: Medicaid Other | Attending: Emergency Medicine | Admitting: Emergency Medicine

## 2013-12-03 DIAGNOSIS — S99929A Unspecified injury of unspecified foot, initial encounter: Secondary | ICD-10-CM

## 2013-12-03 DIAGNOSIS — M25571 Pain in right ankle and joints of right foot: Secondary | ICD-10-CM

## 2013-12-03 DIAGNOSIS — S0993XA Unspecified injury of face, initial encounter: Secondary | ICD-10-CM | POA: Diagnosis present

## 2013-12-03 DIAGNOSIS — I252 Old myocardial infarction: Secondary | ICD-10-CM | POA: Diagnosis not present

## 2013-12-03 DIAGNOSIS — M549 Dorsalgia, unspecified: Secondary | ICD-10-CM

## 2013-12-03 DIAGNOSIS — S4980XA Other specified injuries of shoulder and upper arm, unspecified arm, initial encounter: Secondary | ICD-10-CM | POA: Insufficient documentation

## 2013-12-03 DIAGNOSIS — S99919A Unspecified injury of unspecified ankle, initial encounter: Secondary | ICD-10-CM

## 2013-12-03 DIAGNOSIS — Y9389 Activity, other specified: Secondary | ICD-10-CM | POA: Diagnosis not present

## 2013-12-03 DIAGNOSIS — Y92410 Unspecified street and highway as the place of occurrence of the external cause: Secondary | ICD-10-CM | POA: Insufficient documentation

## 2013-12-03 DIAGNOSIS — F319 Bipolar disorder, unspecified: Secondary | ICD-10-CM | POA: Insufficient documentation

## 2013-12-03 DIAGNOSIS — S0990XA Unspecified injury of head, initial encounter: Secondary | ICD-10-CM | POA: Insufficient documentation

## 2013-12-03 DIAGNOSIS — S199XXA Unspecified injury of neck, initial encounter: Principal | ICD-10-CM

## 2013-12-03 DIAGNOSIS — S46909A Unspecified injury of unspecified muscle, fascia and tendon at shoulder and upper arm level, unspecified arm, initial encounter: Secondary | ICD-10-CM | POA: Insufficient documentation

## 2013-12-03 DIAGNOSIS — I10 Essential (primary) hypertension: Secondary | ICD-10-CM | POA: Diagnosis not present

## 2013-12-03 DIAGNOSIS — F411 Generalized anxiety disorder: Secondary | ICD-10-CM | POA: Diagnosis not present

## 2013-12-03 DIAGNOSIS — Z91040 Latex allergy status: Secondary | ICD-10-CM | POA: Insufficient documentation

## 2013-12-03 DIAGNOSIS — IMO0002 Reserved for concepts with insufficient information to code with codable children: Secondary | ICD-10-CM | POA: Insufficient documentation

## 2013-12-03 DIAGNOSIS — Z794 Long term (current) use of insulin: Secondary | ICD-10-CM | POA: Diagnosis not present

## 2013-12-03 DIAGNOSIS — S8990XA Unspecified injury of unspecified lower leg, initial encounter: Secondary | ICD-10-CM | POA: Diagnosis not present

## 2013-12-03 DIAGNOSIS — M542 Cervicalgia: Secondary | ICD-10-CM

## 2013-12-03 DIAGNOSIS — Z79899 Other long term (current) drug therapy: Secondary | ICD-10-CM | POA: Diagnosis not present

## 2013-12-03 DIAGNOSIS — E119 Type 2 diabetes mellitus without complications: Secondary | ICD-10-CM | POA: Insufficient documentation

## 2013-12-03 DIAGNOSIS — M25512 Pain in left shoulder: Secondary | ICD-10-CM

## 2013-12-03 HISTORY — DX: Anxiety disorder, unspecified: F41.9

## 2013-12-03 HISTORY — DX: Bipolar disorder, unspecified: F31.9

## 2013-12-03 LAB — URINE MICROSCOPIC-ADD ON

## 2013-12-03 LAB — I-STAT CHEM 8, ED
BUN: 18 mg/dL (ref 6–23)
CALCIUM ION: 1.19 mmol/L (ref 1.12–1.23)
CHLORIDE: 104 meq/L (ref 96–112)
Creatinine, Ser: 1.1 mg/dL (ref 0.50–1.10)
Glucose, Bld: 138 mg/dL — ABNORMAL HIGH (ref 70–99)
HCT: 47 % — ABNORMAL HIGH (ref 36.0–46.0)
Hemoglobin: 16 g/dL — ABNORMAL HIGH (ref 12.0–15.0)
Potassium: 4 mEq/L (ref 3.7–5.3)
Sodium: 141 mEq/L (ref 137–147)
TCO2: 25 mmol/L (ref 0–100)

## 2013-12-03 LAB — URINALYSIS, ROUTINE W REFLEX MICROSCOPIC
BILIRUBIN URINE: NEGATIVE
Glucose, UA: NEGATIVE mg/dL
Hgb urine dipstick: NEGATIVE
KETONES UR: NEGATIVE mg/dL
Leukocytes, UA: NEGATIVE
NITRITE: NEGATIVE
PH: 7 (ref 5.0–8.0)
Protein, ur: 100 mg/dL — AB
Specific Gravity, Urine: 1.021 (ref 1.005–1.030)
UROBILINOGEN UA: 1 mg/dL (ref 0.0–1.0)

## 2013-12-03 LAB — CBC
HCT: 43 % (ref 36.0–46.0)
Hemoglobin: 13.7 g/dL (ref 12.0–15.0)
MCH: 27.3 pg (ref 26.0–34.0)
MCHC: 31.9 g/dL (ref 30.0–36.0)
MCV: 85.8 fL (ref 78.0–100.0)
PLATELETS: 186 10*3/uL (ref 150–400)
RBC: 5.01 MIL/uL (ref 3.87–5.11)
RDW: 14.1 % (ref 11.5–15.5)
WBC: 6.7 10*3/uL (ref 4.0–10.5)

## 2013-12-03 LAB — COMPREHENSIVE METABOLIC PANEL
ALT: 33 U/L (ref 0–35)
AST: 35 U/L (ref 0–37)
Albumin: 3.8 g/dL (ref 3.5–5.2)
Alkaline Phosphatase: 77 U/L (ref 39–117)
BILIRUBIN TOTAL: 0.4 mg/dL (ref 0.3–1.2)
BUN: 16 mg/dL (ref 6–23)
CHLORIDE: 102 meq/L (ref 96–112)
CO2: 24 meq/L (ref 19–32)
Calcium: 9.6 mg/dL (ref 8.4–10.5)
Creatinine, Ser: 1.06 mg/dL (ref 0.50–1.10)
GFR calc Af Amer: 72 mL/min — ABNORMAL LOW (ref >90)
GFR, EST NON AFRICAN AMERICAN: 62 mL/min — AB (ref >90)
Glucose, Bld: 144 mg/dL — ABNORMAL HIGH (ref 70–99)
Potassium: 4.2 mEq/L (ref 3.7–5.3)
SODIUM: 138 meq/L (ref 137–147)
Total Protein: 7.3 g/dL (ref 6.0–8.3)

## 2013-12-03 LAB — ETHANOL

## 2013-12-03 MED ORDER — HYDROCODONE-ACETAMINOPHEN 5-325 MG PO TABS: 1.0000 | ORAL_TABLET | Freq: Four times a day (QID) | ORAL | Status: DC | PRN

## 2013-12-03 MED ORDER — ORPHENADRINE CITRATE ER 100 MG PO TB12: 100.0000 mg | ORAL_TABLET | Freq: Two times a day (BID) | ORAL | Status: DC | PRN

## 2013-12-03 MED ORDER — ONDANSETRON HCL 4 MG/2ML IJ SOLN
4.0000 mg | Freq: Once | INTRAMUSCULAR | Status: AC
  Administered 2013-12-03: 4 mg via INTRAVENOUS
  Filled 2013-12-03: qty 2

## 2013-12-03 MED ORDER — IOHEXOL 300 MG/ML  SOLN
100.0000 mL | Freq: Once | INTRAMUSCULAR | Status: AC | PRN
  Administered 2013-12-03: 100 mL via INTRAVENOUS

## 2013-12-03 MED ORDER — HYDROCODONE-ACETAMINOPHEN 5-325 MG PO TABS
1.0000 | ORAL_TABLET | Freq: Once | ORAL | Status: AC
  Administered 2013-12-03: 1 via ORAL
  Filled 2013-12-03: qty 1

## 2013-12-03 MED ORDER — HYDROMORPHONE HCL PF 1 MG/ML IJ SOLN
1.0000 mg | Freq: Once | INTRAMUSCULAR | Status: AC
  Administered 2013-12-03: 1 mg via INTRAVENOUS
  Filled 2013-12-03: qty 1
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
## 2013-12-03 NOTE — ED Notes (Signed)
Pt ambulated to the door and sat in the chair, awaiting d/c at this time

## 2013-12-03 NOTE — ED Notes (Signed)
Pt transported to ct via stretcher.

## 2013-12-03 NOTE — ED Notes (Signed)
Resident MD with pt for revaluation

## 2013-12-03 NOTE — ED Notes (Signed)
Family with patient at this time
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
## 2013-12-07 ENCOUNTER — Telehealth (INDEPENDENT_AMBULATORY_CARE_PROVIDER_SITE_OTHER): Payer: Self-pay

## 2013-12-07 ENCOUNTER — Other Ambulatory Visit: Payer: Self-pay | Admitting: Internal Medicine

## 2013-12-07 ENCOUNTER — Ambulatory Visit
Admission: RE | Admit: 2013-12-07 | Discharge: 2013-12-07 | Disposition: A | Discharge status: Home / Self Care | Payer: Medicaid Other | Source: Ambulatory Visit | Attending: General Surgery | Admitting: General Surgery

## 2013-12-07 DIAGNOSIS — N920 Excessive and frequent menstruation with regular cycle: Secondary | ICD-10-CM

## 2013-12-07 DIAGNOSIS — R109 Unspecified abdominal pain: Secondary | ICD-10-CM
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
## 2013-12-08 ENCOUNTER — Other Ambulatory Visit: Payer: Medicaid Other

## 2013-12-09 NOTE — Telephone Encounter (Signed)
No evidence of recurrent hernia on CT

## 2013-12-09 NOTE — Telephone Encounter (Signed)
Patient called back and given msg. Appt cancelled.

## 2013-12-09 NOTE — Telephone Encounter (Signed)
LMOM> CT normal. No evidence of recurrent hernia. Does show scar tissue. Scar tissue probable for her pain. Nothing to surgically. She is scheduled for a follow up late this week. She does not need to keep appt unless she would like to discuss results with Dr Marlou Starks. Other wise she can cancel the appt.

## 2013-12-14 ENCOUNTER — Encounter (INDEPENDENT_AMBULATORY_CARE_PROVIDER_SITE_OTHER): Payer: Medicaid Other | Admitting: General Surgery

## 2014-01-13 ENCOUNTER — Emergency Department (HOSPITAL_COMMUNITY): Payer: Medicaid Other

## 2014-01-13 ENCOUNTER — Inpatient Hospital Stay (HOSPITAL_COMMUNITY)
Admission: EM | Admit: 2014-01-13 | Discharge: 2014-01-16 | DRG: 194 | Disposition: A | Discharge status: Home / Self Care | Payer: Medicaid Other | Attending: Internal Medicine | Admitting: Internal Medicine

## 2014-01-13 ENCOUNTER — Encounter (HOSPITAL_COMMUNITY): Payer: Self-pay | Admitting: Emergency Medicine

## 2014-01-13 DIAGNOSIS — G8929 Other chronic pain: Secondary | ICD-10-CM | POA: Diagnosis present

## 2014-01-13 DIAGNOSIS — A088 Other specified intestinal infections: Secondary | ICD-10-CM | POA: Diagnosis present

## 2014-01-13 DIAGNOSIS — Z6841 Body Mass Index (BMI) 40.0 and over, adult: Secondary | ICD-10-CM | POA: Diagnosis not present

## 2014-01-13 DIAGNOSIS — R112 Nausea with vomiting, unspecified: Secondary | ICD-10-CM

## 2014-01-13 DIAGNOSIS — E669 Obesity, unspecified: Secondary | ICD-10-CM | POA: Diagnosis present

## 2014-01-13 DIAGNOSIS — Z794 Long term (current) use of insulin: Secondary | ICD-10-CM | POA: Diagnosis not present

## 2014-01-13 DIAGNOSIS — E119 Type 2 diabetes mellitus without complications: Secondary | ICD-10-CM | POA: Diagnosis present

## 2014-01-13 DIAGNOSIS — J45901 Unspecified asthma with (acute) exacerbation: Secondary | ICD-10-CM | POA: Diagnosis present

## 2014-01-13 DIAGNOSIS — F411 Generalized anxiety disorder: Secondary | ICD-10-CM | POA: Diagnosis present

## 2014-01-13 DIAGNOSIS — E785 Hyperlipidemia, unspecified: Secondary | ICD-10-CM

## 2014-01-13 DIAGNOSIS — G4730 Sleep apnea, unspecified: Secondary | ICD-10-CM | POA: Diagnosis present

## 2014-01-13 DIAGNOSIS — Z87891 Personal history of nicotine dependence: Secondary | ICD-10-CM | POA: Diagnosis not present

## 2014-01-13 DIAGNOSIS — R0602 Shortness of breath: Secondary | ICD-10-CM | POA: Diagnosis present

## 2014-01-13 DIAGNOSIS — F4310 Post-traumatic stress disorder, unspecified: Secondary | ICD-10-CM | POA: Diagnosis present

## 2014-01-13 DIAGNOSIS — I10 Essential (primary) hypertension: Secondary | ICD-10-CM | POA: Diagnosis present

## 2014-01-13 DIAGNOSIS — J189 Pneumonia, unspecified organism: Principal | ICD-10-CM | POA: Diagnosis present

## 2014-01-13 DIAGNOSIS — F339 Major depressive disorder, recurrent, unspecified: Secondary | ICD-10-CM

## 2014-01-13 DIAGNOSIS — R197 Diarrhea, unspecified: Secondary | ICD-10-CM

## 2014-01-13 DIAGNOSIS — F061 Catatonic disorder due to known physiological condition: Secondary | ICD-10-CM

## 2014-01-13 DIAGNOSIS — I252 Old myocardial infarction: Secondary | ICD-10-CM | POA: Diagnosis not present

## 2014-01-13 DIAGNOSIS — R062 Wheezing: Secondary | ICD-10-CM

## 2014-01-13 DIAGNOSIS — R109 Unspecified abdominal pain: Secondary | ICD-10-CM

## 2014-01-13 DIAGNOSIS — F319 Bipolar disorder, unspecified: Secondary | ICD-10-CM | POA: Diagnosis present

## 2014-01-13 LAB — URINALYSIS, ROUTINE W REFLEX MICROSCOPIC
Bilirubin Urine: NEGATIVE
GLUCOSE, UA: NEGATIVE mg/dL
Hgb urine dipstick: NEGATIVE
Ketones, ur: NEGATIVE mg/dL
LEUKOCYTES UA: NEGATIVE
NITRITE: NEGATIVE
PROTEIN: 100 mg/dL — AB
Specific Gravity, Urine: 1.017 (ref 1.005–1.030)
UROBILINOGEN UA: 0.2 mg/dL (ref 0.0–1.0)
pH: 5 (ref 5.0–8.0)

## 2014-01-13 LAB — COMPREHENSIVE METABOLIC PANEL
ALT: 53 U/L — AB (ref 0–35)
AST: 52 U/L — AB (ref 0–37)
Albumin: 3.7 g/dL (ref 3.5–5.2)
Alkaline Phosphatase: 78 U/L (ref 39–117)
Anion gap: 16 — ABNORMAL HIGH (ref 5–15)
BUN: 7 mg/dL (ref 6–23)
CO2: 23 mEq/L (ref 19–32)
CREATININE: 0.92 mg/dL (ref 0.50–1.10)
Calcium: 9.1 mg/dL (ref 8.4–10.5)
Chloride: 99 mEq/L (ref 96–112)
GFR calc Af Amer: 86 mL/min — ABNORMAL LOW (ref >90)
GFR calc non Af Amer: 74 mL/min — ABNORMAL LOW (ref >90)
Glucose, Bld: 204 mg/dL — ABNORMAL HIGH (ref 70–99)
Potassium: 4.4 mEq/L (ref 3.7–5.3)
SODIUM: 138 meq/L (ref 137–147)
TOTAL PROTEIN: 7.2 g/dL (ref 6.0–8.3)
Total Bilirubin: 0.4 mg/dL (ref 0.3–1.2)

## 2014-01-13 LAB — PREGNANCY, URINE: Preg Test, Ur: NEGATIVE

## 2014-01-13 LAB — GLUCOSE, CAPILLARY
GLUCOSE-CAPILLARY: 394 mg/dL — AB (ref 70–99)
GLUCOSE-CAPILLARY: 314 mg/dL — AB (ref 70–99)

## 2014-01-13 LAB — URINE MICROSCOPIC-ADD ON

## 2014-01-13 LAB — CBC
HCT: 42.5 % (ref 36.0–46.0)
HEMOGLOBIN: 13.8 g/dL (ref 12.0–15.0)
MCH: 27.4 pg (ref 26.0–34.0)
MCHC: 32.5 g/dL (ref 30.0–36.0)
MCV: 84.3 fL (ref 78.0–100.0)
PLATELETS: 174 10*3/uL (ref 150–400)
RBC: 5.04 MIL/uL (ref 3.87–5.11)
RDW: 13.7 % (ref 11.5–15.5)
WBC: 7.5 10*3/uL (ref 4.0–10.5)

## 2014-01-13 LAB — CBC WITH DIFFERENTIAL/PLATELET
BASOS ABS: 0 10*3/uL (ref 0.0–0.1)
Basophils Relative: 0 % (ref 0–1)
EOS PCT: 2 % (ref 0–5)
Eosinophils Absolute: 0.1 10*3/uL (ref 0.0–0.7)
HCT: 42.7 % (ref 36.0–46.0)
Hemoglobin: 13.8 g/dL (ref 12.0–15.0)
Lymphocytes Relative: 40 % (ref 12–46)
Lymphs Abs: 2.2 10*3/uL (ref 0.7–4.0)
MCH: 27.7 pg (ref 26.0–34.0)
MCHC: 32.3 g/dL (ref 30.0–36.0)
MCV: 85.6 fL (ref 78.0–100.0)
MONO ABS: 0.3 10*3/uL (ref 0.1–1.0)
Monocytes Relative: 6 % (ref 3–12)
Neutro Abs: 2.9 10*3/uL (ref 1.7–7.7)
Neutrophils Relative %: 52 % (ref 43–77)
PLATELETS: 168 10*3/uL (ref 150–400)
RBC: 4.99 MIL/uL (ref 3.87–5.11)
RDW: 13.7 % (ref 11.5–15.5)
WBC: 5.5 10*3/uL (ref 4.0–10.5)

## 2014-01-13 LAB — PRO B NATRIURETIC PEPTIDE: Pro B Natriuretic peptide (BNP): 13.3 pg/mL (ref 0–125)

## 2014-01-13 LAB — CREATININE, SERUM
CREATININE: 0.84 mg/dL (ref 0.50–1.10)
GFR calc Af Amer: >90 mL/min (ref >90)
GFR, EST NON AFRICAN AMERICAN: 83 mL/min — AB (ref >90)

## 2014-01-13 LAB — HEMOGLOBIN A1C
HEMOGLOBIN A1C: 7.8 % — AB (ref <5.7)
Mean Plasma Glucose: 177 mg/dL — ABNORMAL HIGH (ref <117)

## 2014-01-13 LAB — TROPONIN I

## 2014-01-13 LAB — LIPASE, BLOOD: LIPASE: 19 U/L (ref 11–59)

## 2014-01-13 MED ORDER — INSULIN ASPART 100 UNIT/ML ~~LOC~~ SOLN
0.0000 [IU] | Freq: Three times a day (TID) | SUBCUTANEOUS | Status: DC
  Administered 2014-01-13: 9 [IU] via SUBCUTANEOUS

## 2014-01-13 MED ORDER — HYDROXYZINE HCL 25 MG PO TABS: 50.0000 mg | ORAL_TABLET | Freq: Three times a day (TID) | ORAL | Status: DC | PRN

## 2014-01-13 MED ORDER — IPRATROPIUM-ALBUTEROL 0.5-2.5 (3) MG/3ML IN SOLN
3.0000 mL | Freq: Once | RESPIRATORY_TRACT | Status: AC
  Administered 2014-01-13: 3 mL via RESPIRATORY_TRACT
  Filled 2014-01-13: qty 3

## 2014-01-13 MED ORDER — OXYCODONE-ACETAMINOPHEN 10-325 MG PO TABS: 1.0000 | ORAL_TABLET | Freq: Three times a day (TID) | ORAL | Status: DC | PRN

## 2014-01-13 MED ORDER — LISINOPRIL 20 MG PO TABS
20.0000 mg | ORAL_TABLET | Freq: Two times a day (BID) | ORAL | Status: DC
  Administered 2014-01-13 – 2014-01-16 (×6): 20 mg via ORAL
  Filled 2014-01-13 (×7): qty 1

## 2014-01-13 MED ORDER — ONDANSETRON HCL 4 MG PO TABS: 4.0000 mg | ORAL_TABLET | Freq: Four times a day (QID) | ORAL | Status: DC | PRN

## 2014-01-13 MED ORDER — METHYLPREDNISOLONE SODIUM SUCC 125 MG IJ SOLR
125.0000 mg | Freq: Once | INTRAMUSCULAR | Status: AC
  Administered 2014-01-13: 125 mg via INTRAVENOUS
  Filled 2014-01-13: qty 2

## 2014-01-13 MED ORDER — CYCLOBENZAPRINE HCL 10 MG PO TABS: 10.0000 mg | ORAL_TABLET | Freq: Three times a day (TID) | ORAL | Status: DC | PRN

## 2014-01-13 MED ORDER — ALBUTEROL SULFATE (2.5 MG/3ML) 0.083% IN NEBU
5.0000 mg | INHALATION_SOLUTION | Freq: Once | RESPIRATORY_TRACT | Status: AC
Start: 2014-01-13 — End: 2014-01-13
  Administered 2014-01-13: 5 mg via RESPIRATORY_TRACT
  Filled 2014-01-13: qty 6

## 2014-01-13 MED ORDER — PANTOPRAZOLE SODIUM 40 MG PO TBEC
40.0000 mg | DELAYED_RELEASE_TABLET | Freq: Every day | ORAL | Status: DC
  Administered 2014-01-14 – 2014-01-16 (×3): 40 mg via ORAL
  Filled 2014-01-13 (×2): qty 1

## 2014-01-13 MED ORDER — DEXTROSE 5 % IV SOLN
500.0000 mg | INTRAVENOUS | Status: DC
  Filled 2014-01-13: qty 500

## 2014-01-13 MED ORDER — IPRATROPIUM-ALBUTEROL 0.5-2.5 (3) MG/3ML IN SOLN: 3.0000 mL | RESPIRATORY_TRACT | Status: DC | PRN

## 2014-01-13 MED ORDER — ORPHENADRINE CITRATE ER 100 MG PO TB12
100.0000 mg | ORAL_TABLET | Freq: Two times a day (BID) | ORAL | Status: DC | PRN
  Filled 2014-01-13: qty 1

## 2014-01-13 MED ORDER — IPRATROPIUM-ALBUTEROL 0.5-2.5 (3) MG/3ML IN SOLN
3.0000 mL | Freq: Four times a day (QID) | RESPIRATORY_TRACT | Status: DC
  Administered 2014-01-14 – 2014-01-16 (×10): 3 mL via RESPIRATORY_TRACT
  Filled 2014-01-13 (×11): qty 3

## 2014-01-13 MED ORDER — AZITHROMYCIN 500 MG IV SOLR
500.0000 mg | INTRAVENOUS | Status: DC
  Administered 2014-01-13 – 2014-01-14 (×2): 500 mg via INTRAVENOUS
  Filled 2014-01-13 (×3): qty 500

## 2014-01-13 MED ORDER — GUAIFENESIN ER 600 MG PO TB12
1200.0000 mg | ORAL_TABLET | Freq: Two times a day (BID) | ORAL | Status: DC
  Administered 2014-01-13 – 2014-01-16 (×6): 1200 mg via ORAL
  Filled 2014-01-13 (×7): qty 2

## 2014-01-13 MED ORDER — MAGNESIUM SULFATE 40 MG/ML IJ SOLN
2.0000 g | Freq: Once | INTRAMUSCULAR | Status: AC
  Administered 2014-01-13: 2 g via INTRAVENOUS
  Filled 2014-01-13: qty 50

## 2014-01-13 MED ORDER — DEXTROSE 5 % IV SOLN
1.0000 g | INTRAVENOUS | Status: DC
  Administered 2014-01-13 – 2014-01-14 (×2): 1 g via INTRAVENOUS
  Filled 2014-01-13 (×3): qty 10

## 2014-01-13 MED ORDER — METHYLPREDNISOLONE SODIUM SUCC 125 MG IJ SOLR
60.0000 mg | Freq: Two times a day (BID) | INTRAMUSCULAR | Status: DC
  Administered 2014-01-13 – 2014-01-14 (×3): 60 mg via INTRAVENOUS
  Filled 2014-01-13 (×5): qty 0.96

## 2014-01-13 MED ORDER — ATENOLOL 50 MG PO TABS
50.0000 mg | ORAL_TABLET | Freq: Every day | ORAL | Status: DC
  Administered 2014-01-14 – 2014-01-16 (×3): 50 mg via ORAL
  Filled 2014-01-13 (×3): qty 1

## 2014-01-13 MED ORDER — OXYCODONE-ACETAMINOPHEN 5-325 MG PO TABS
1.0000 | ORAL_TABLET | Freq: Three times a day (TID) | ORAL | Status: DC | PRN
  Administered 2014-01-15 (×2): 1 via ORAL
  Filled 2014-01-13 (×2): qty 1

## 2014-01-13 MED ORDER — IPRATROPIUM-ALBUTEROL 0.5-2.5 (3) MG/3ML IN SOLN
3.0000 mL | RESPIRATORY_TRACT | Status: DC
  Administered 2014-01-13 (×2): 3 mL via RESPIRATORY_TRACT
  Filled 2014-01-13 (×2): qty 3

## 2014-01-13 MED ORDER — OXYCODONE HCL 5 MG PO TABS
5.0000 mg | ORAL_TABLET | Freq: Three times a day (TID) | ORAL | Status: DC | PRN
Start: 2014-01-13 — End: 2014-01-16
  Administered 2014-01-15 (×2): 5 mg via ORAL
  Filled 2014-01-13 (×2): qty 1

## 2014-01-13 MED ORDER — RISPERIDONE 2 MG PO TABS
2.0000 mg | ORAL_TABLET | Freq: Every day | ORAL | Status: DC
  Administered 2014-01-13 – 2014-01-15 (×3): 2 mg via ORAL
  Filled 2014-01-13 (×4): qty 1

## 2014-01-13 MED ORDER — AMLODIPINE BESYLATE 10 MG PO TABS
10.0000 mg | ORAL_TABLET | Freq: Every day | ORAL | Status: DC
  Administered 2014-01-14 – 2014-01-16 (×3): 10 mg via ORAL
  Filled 2014-01-13 (×3): qty 1

## 2014-01-13 MED ORDER — SODIUM CHLORIDE 0.9 % IV BOLUS (SEPSIS)
500.0000 mL | Freq: Once | INTRAVENOUS | Status: AC
  Administered 2014-01-13: 500 mL via INTRAVENOUS

## 2014-01-13 MED ORDER — KETOPROFEN 75 MG PO CAPS
75.0000 mg | ORAL_CAPSULE | Freq: Three times a day (TID) | ORAL | Status: DC | PRN
  Filled 2014-01-13: qty 1

## 2014-01-13 MED ORDER — DEXTROSE 5 % IV SOLN
1.0000 g | Freq: Once | INTRAVENOUS | Status: DC
  Administered 2014-01-13: 1 g via INTRAVENOUS
  Filled 2014-01-13: qty 10

## 2014-01-13 MED ORDER — SUCRALFATE 1 G PO TABS
1.0000 g | ORAL_TABLET | Freq: Every day | ORAL | Status: DC
  Administered 2014-01-13 – 2014-01-16 (×4): 1 g via ORAL
  Filled 2014-01-13 (×4): qty 1

## 2014-01-13 MED ORDER — DULOXETINE HCL 60 MG PO CPEP
60.0000 mg | ORAL_CAPSULE | Freq: Every day | ORAL | Status: DC
  Administered 2014-01-13 – 2014-01-16 (×4): 60 mg via ORAL
  Filled 2014-01-13 (×4): qty 1

## 2014-01-13 MED ORDER — TIOTROPIUM BROMIDE MONOHYDRATE 18 MCG IN CAPS
18.0000 ug | ORAL_CAPSULE | Freq: Every day | RESPIRATORY_TRACT | Status: DC
  Administered 2014-01-14 – 2014-01-15 (×2): 18 ug via RESPIRATORY_TRACT
  Filled 2014-01-13: qty 5

## 2014-01-13 MED ORDER — LORAZEPAM 1 MG PO TABS
1.0000 mg | ORAL_TABLET | Freq: Three times a day (TID) | ORAL | Status: DC | PRN
  Administered 2014-01-15 (×2): 1 mg via ORAL
  Filled 2014-01-13 (×2): qty 1

## 2014-01-13 MED ORDER — INSULIN GLARGINE 100 UNIT/ML ~~LOC~~ SOLN
25.0000 [IU] | Freq: Every day | SUBCUTANEOUS | Status: DC
  Filled 2014-01-13: qty 0.25

## 2014-01-13 MED ORDER — ALBUTEROL (5 MG/ML) CONTINUOUS INHALATION SOLN
10.0000 mg/h | INHALATION_SOLUTION | Freq: Once | RESPIRATORY_TRACT | Status: AC
  Administered 2014-01-13: 10 mg/h via RESPIRATORY_TRACT
  Filled 2014-01-13: qty 20

## 2014-01-13 MED ORDER — SODIUM CHLORIDE 0.9 % IV SOLN
INTRAVENOUS | Status: AC
  Administered 2014-01-13: via INTRAVENOUS

## 2014-01-13 MED ORDER — ALBUTEROL SULFATE HFA 108 (90 BASE) MCG/ACT IN AERS: 2.0000 | INHALATION_SPRAY | RESPIRATORY_TRACT | Status: DC | PRN

## 2014-01-13 MED ORDER — ENOXAPARIN SODIUM 40 MG/0.4ML ~~LOC~~ SOLN
40.0000 mg | SUBCUTANEOUS | Status: DC
  Administered 2014-01-13: 40 mg via SUBCUTANEOUS
  Filled 2014-01-13 (×2): qty 0.4

## 2014-01-13 MED ORDER — CANAGLIFLOZIN 300 MG PO TABS
300.0000 mg | ORAL_TABLET | Freq: Every day | ORAL | Status: DC
  Administered 2014-01-14 – 2014-01-16 (×3): 300 mg via ORAL
  Filled 2014-01-13 (×3): qty 1

## 2014-01-13 MED ORDER — ONDANSETRON HCL 4 MG/2ML IJ SOLN: 4.0000 mg | Freq: Four times a day (QID) | INTRAMUSCULAR | Status: DC | PRN

## 2014-01-13 MED ORDER — INSULIN ASPART PROT & ASPART (70-30 MIX) 100 UNIT/ML ~~LOC~~ SUSP
30.0000 [IU] | Freq: Two times a day (BID) | SUBCUTANEOUS | Status: DC
  Administered 2014-01-13 – 2014-01-16 (×6): 30 [IU] via SUBCUTANEOUS
  Filled 2014-01-13: qty 10

## 2014-01-13 NOTE — ED Provider Notes (Signed)
CSN: [id number]     Arrival date & time 01/13/14  3267 History   First MD Initiated Contact with Patient 01/13/14 [phone]     Chief Complaint  Patient presents with  . Shortness of Breath  . Asthma  . Emesis     (Consider location/radiation/quality/duration/timing/severity/associated sxs/prior Treatment) The history is provided by the patient. No language interpreter was used.  Diana Henderson is a 43 y/o F with PMHx of asthma, DM, HTN, HLD, sleep apnea, depression, PTSD, anxiety, bipolar presenting to the ED with shortness of breath, abdominal pain, and emesis for the past couple of days. Patient reported that she has been having worsening of her asthma for the past couple of days with no reliefs with nebulizer treatments at home - stated that she has been using at least 2 treatments per day. Patient reported that she has been having nasal congestion as well as cough that is dry associated with this. Patient stated that she has been having chills. Reported that the shortness of breath worsens with exertion. Patient reported that she has been feeling dizzy - reported that when she got up this morning and with activity she has been having dizziness. Stated that she has been dealing with abdominal pain for the past couple of days, but stated that she normally has abdominal pain - stated that she had an Ultrasound done before with negative findings. Stated that she has been feeling nauseous and reported one episode of emesis yesterday and today - denied NB/NB. Denied traveling, fever, weakness, numbness, tingling, diarrhea, melena, hematochezia, hematemesis, syncope, dysuria, urinary symptoms. PCP Dr. Vevelyn Pat  Past Medical History  Diagnosis Date  . Diabetes mellitus   . Hypertension   . Myocardial infarction   . Asthma   . Hyperlipidemia   . Sleep apnea   . Depression   . PTSD (post-traumatic stress disorder)   . Pain in joint, ankle and foot 02/17/2013  . Anxiety   . Bipolar 1 disorder     Past Surgical History  Procedure Laterality Date  . Tubal ligation    . Midtarsal arthrodesis Right 07/17/12  . Remove implant deep Right 07/17/12  . Lapidus fusion Right 01/10/12  . Carpal tunnel release Left 2001    Left wrist  . Gastroc recession extremity Right 3//20/14    Right foot  . Hernia repair      1996   History reviewed. No pertinent family history. History  Substance Use Topics  . Smoking status: Former Smoker    Types: Cigarettes    Quit date: 07/03/2013  . Smokeless tobacco: Never Used  . Alcohol Use: No   OB History   Grav Para Term Preterm Abortions TAB SAB Ect Mult Living                 Review of Systems  Constitutional: Positive for chills. Negative for fever.  HENT: Negative for sore throat.   Respiratory: Positive for cough, chest tightness, shortness of breath and wheezing.   Cardiovascular: Negative for chest pain.  Gastrointestinal: Positive for nausea, vomiting and abdominal pain. Negative for diarrhea, constipation, blood in stool and anal bleeding.  Musculoskeletal: Negative for back pain, neck pain and neck stiffness.  Neurological: Positive for dizziness. Negative for weakness.      Allergies  Celery oil and Latex  Home Medications   Prior to Admission medications   Medication Sig Start Date End Date Taking? Authorizing Provider  albuterol (PROVENTIL HFA;VENTOLIN HFA) 108 (90 BASE) MCG/ACT inhaler Inhale 2 puffs into  the lungs every 4 (four) hours as needed for wheezing or shortness of breath.   Yes Historical Provider, MD  amLODipine (NORVASC) 10 MG tablet Take 10 mg by mouth daily.   Yes Historical Provider, MD  atenolol (TENORMIN) 50 MG tablet Take 50 mg by mouth daily.   Yes Historical Provider, MD  Canagliflozin (INVOKANA) 300 MG TABS Take 300 mg by mouth daily.   Yes Historical Provider, MD  cyclobenzaprine (FLEXERIL) 10 MG tablet Take 10 mg by mouth 3 (three) times daily as needed for muscle spasms.    Yes Historical Provider,  MD  DULoxetine (CYMBALTA) 60 MG capsule Take 60 mg by mouth daily.   Yes Historical Provider, MD  hydrOXYzine (ATARAX/VISTARIL) 50 MG tablet Take 50 mg by mouth 3 (three) times daily as needed for anxiety.    Yes Historical Provider, MD  insulin glargine (LANTUS) 100 UNIT/ML injection Inject 25 Units into the skin at bedtime.    Yes Historical Provider, MD  insulin NPH-regular Human (NOVOLIN 70/30) (70-30) 100 UNIT/ML injection Inject 30 Units into the skin 2 (two) times daily with a meal.   Yes Historical Provider, MD  ketoprofen (ORUDIS) 75 MG capsule Take 75 mg by mouth 3 (three) times daily as needed for pain (pain).   Yes Historical Provider, MD  lisinopril (PRINIVIL,ZESTRIL) 20 MG tablet Take 20 mg by mouth 2 (two) times daily.    Yes Historical Provider, MD  LORazepam (ATIVAN) 1 MG tablet Take 1 tablet (1 mg total) by mouth 3 (three) times daily as needed for anxiety. 04/03/50  Yes Delora Fuel, MD  omeprazole (PRILOSEC) 20 MG capsule Take 1 capsule (20 mg total) by mouth daily. 07/07/13  Yes Carmin Muskrat, MD  orphenadrine (NORFLEX) 100 MG tablet Take 1 tablet (100 mg total) by mouth 2 (two) times daily as needed for muscle spasms (moderate pain). 12/03/13  Yes Bonnita Hollow, MD  oxyCODONE-acetaminophen (PERCOCET) 10-325 MG per tablet Take 1 tablet by mouth every 8 (eight) hours as needed for pain. 10/23/13  Yes Myeong Sheard, DPM  risperiDONE (RISPERDAL) 2 MG tablet Take 2 mg by mouth at bedtime.   Yes Historical Provider, MD  sucralfate (CARAFATE) 1 G tablet Take 1 g by mouth daily.   Yes Historical Provider, MD  tiotropium (SPIRIVA) 18 MCG inhalation capsule Place 18 mcg into inhaler and inhale daily.   Yes Historical Provider, MD   BP 145/81  Pulse 72  Temp(Src) 98.7 F (37.1 C) (Oral)  Resp 26  Ht 5\' 6"  (1.676 m)  Wt 269 lb (122.018 kg)  BMI 43.44 kg/m2  SpO2 100%  LMP 12/14/2013 Physical Exam  Nursing note and vitals reviewed. Constitutional: She is oriented to person, place,  and time. She appears well-developed and well-nourished. No distress.  HENT:  Head: Normocephalic and atraumatic.  Mouth/Throat: Oropharynx is clear and moist. No oropharyngeal exudate.  Eyes: Conjunctivae and EOM are normal. Pupils are equal, round, and reactive to light. Right eye exhibits no discharge.  Neck: Normal range of motion. Neck supple. No tracheal deviation present.  Negative neck stiffness Negative nuchal rigidity  Negative cervical lymphadenopathy  Negative meningeal signs   Cardiovascular: Normal rate, regular rhythm and normal heart sounds.  Exam reveals no friction rub.   No murmur heard. Cap refill < 3 seconds Negative swelling or pitting edema noted to the lower extremities bilaterally   Pulmonary/Chest: No respiratory distress. She has wheezes. She has no rales. She exhibits no tenderness.  Patient is able to speak in full  sentences, but at the end of each sentence patient needs to take a deep breath Negative use of accessory muscles Negative stridor Expiratory wheezes noted to upper and lower lobes bilaterally with rhonchi noted to lower lobes bilaterally Decreased lung expansion noted  Abdominal: Soft. Bowel sounds are normal. She exhibits no distension. There is tenderness. There is no rebound and no guarding.  Obese Negative abdominal distension noted BS normoactive in all 4 quadrants Negative peritoneal signs Negative rigidity or guarding noted Discomfort upon palpation to the RUQ, LUQ, and epigastric  Musculoskeletal: Normal range of motion.  Full ROM to upper and lower extremities without difficulty noted, negative ataxia noted.  Lymphadenopathy:    She has no cervical adenopathy.  Neurological: She is alert and oriented to person, place, and time. No cranial nerve deficit. She exhibits normal muscle tone. Coordination normal.  Cranial nerves III-XII grossly intact Strength 5+/5+ to upper and lower extremities bilaterally with resistance applied, equal  distribution noted Equal grip strength  Negative facial drooping Negative slurred speech  Negative aphasia  Skin: Skin is warm and dry. No rash noted. She is not diaphoretic. No erythema.  Psychiatric: She has a normal mood and affect. Her behavior is normal. Thought content normal.    ED Course  Procedures (including critical care time)  10:46 AM This provider re-assessed the patient, after 3 breathing treatments, IV steroids, and Magnesium - patient continues to be short of breath and wheezing.   11:08 AM This provider spoke with Dr. Ree Kida - [facility] - discussed case, history, labs, imaging, ED course, vitals in great detail. As per physician, recommended patient to be started on IV antibiotics burning possible beginnings of pneumonia as noted on chest x-ray. Patient to be admitted to [place] as inpatient.  Results for orders placed during the hospital encounter of 01/13/14  CBC WITH DIFFERENTIAL      Result Value Ref Range   WBC 5.5  4.0 - 10.5 K/uL   RBC 4.99  3.87 - 5.11 MIL/uL   Hemoglobin 13.8  12.0 - 15.0 g/dL   HCT 42.7  36.0 - 46.0 %   MCV 85.6  78.0 - 100.0 fL   MCH 27.7  26.0 - 34.0 pg   MCHC 32.3  30.0 - 36.0 g/dL   RDW 13.7  11.5 - 15.5 %   Platelets 168  150 - 400 K/uL   Neutrophils Relative % 52  43 - 77 %   Neutro Abs 2.9  1.7 - 7.7 K/uL   Lymphocytes Relative 40  12 - 46 %   Lymphs Abs 2.2  0.7 - 4.0 K/uL   Monocytes Relative 6  3 - 12 %   Monocytes Absolute 0.3  0.1 - 1.0 K/uL   Eosinophils Relative 2  0 - 5 %   Eosinophils Absolute 0.1  0.0 - 0.7 K/uL   Basophils Relative 0  0 - 1 %   Basophils Absolute 0.0  0.0 - 0.1 K/uL  COMPREHENSIVE METABOLIC PANEL      Result Value Ref Range   Sodium 138  137 - 147 mEq/L   Potassium 4.4  3.7 - 5.3 mEq/L   Chloride 99  96 - 112 mEq/L   CO2 23  19 - 32 mEq/L   Glucose, Bld 204 (*) 70 - 99 mg/dL   BUN 7  6 - 23 mg/dL   Creatinine, Ser 0.92  0.50 - 1.10 mg/dL   Calcium 9.1  8.4 - 10.5 mg/dL   Total  Protein  7.2  6.0 - 8.3 g/dL   Albumin 3.7  3.5 - 5.2 g/dL   AST 52 (*) 0 - 37 U/L   ALT 53 (*) 0 - 35 U/L   Alkaline Phosphatase 78  39 - 117 U/L   Total Bilirubin 0.4  0.3 - 1.2 mg/dL   GFR calc non Af Amer 74 (*) >90 mL/min   GFR calc Af Amer 86 (*) >90 mL/min   Anion gap 16 (*) 5 - 15  LIPASE, BLOOD      Result Value Ref Range   Lipase 19  11 - 59 U/L  URINALYSIS, ROUTINE W REFLEX MICROSCOPIC      Result Value Ref Range   Color, Urine YELLOW  YELLOW   APPearance CLOUDY (*) CLEAR   Specific Gravity, Urine 1.017  1.005 - 1.030   pH 5.0  5.0 - 8.0   Glucose, UA NEGATIVE  NEGATIVE mg/dL   Hgb urine dipstick NEGATIVE  NEGATIVE   Bilirubin Urine NEGATIVE  NEGATIVE   Ketones, ur NEGATIVE  NEGATIVE mg/dL   Protein, ur 100 (*) NEGATIVE mg/dL   Urobilinogen, UA 0.2  0.0 - 1.0 mg/dL   Nitrite NEGATIVE  NEGATIVE   Leukocytes, UA NEGATIVE  NEGATIVE  TROPONIN I      Result Value Ref Range   Troponin I <0.30  <0.30 ng/mL  PRO B NATRIURETIC PEPTIDE      Result Value Ref Range   Pro B Natriuretic peptide (BNP) 13.3  0 - 125 pg/mL  URINE MICROSCOPIC-ADD ON      Result Value Ref Range   Squamous Epithelial / LPF FEW (*) RARE   WBC, UA 0-2  <3 WBC/hpf   Bacteria, UA FEW (*) RARE   Urine-Other MUCOUS PRESENT    PREGNANCY, URINE      Result Value Ref Range   Preg Test, Ur NEGATIVE  NEGATIVE    Labs Review Labs Reviewed  COMPREHENSIVE METABOLIC PANEL - Abnormal; Notable for the following:    Glucose, Bld 204 (*)    AST 52 (*)    ALT 53 (*)    GFR calc non Af Amer 74 (*)    GFR calc Af Amer 86 (*)    Anion gap 16 (*)    All other components within normal limits  URINALYSIS, ROUTINE W REFLEX MICROSCOPIC - Abnormal; Notable for the following:    APPearance CLOUDY (*)    Protein, ur 100 (*)    All other components within normal limits  URINE MICROSCOPIC-ADD ON - Abnormal; Notable for the following:    Squamous Epithelial / LPF FEW (*)    Bacteria, UA FEW (*)    All other  components within normal limits  CBC WITH DIFFERENTIAL  LIPASE, BLOOD  TROPONIN I  PRO B NATRIURETIC PEPTIDE  PREGNANCY, URINE    Imaging Review Dg Chest 2 View  01/13/2014   CLINICAL DATA:  Shortness of breath, asthma and emesis. Productive cough and cold symptoms.  EXAM: CHEST  2 VIEW  COMPARISON:  12/03/2013  FINDINGS: There is motion artifact on the lateral image. Lungs are adequately inflated as there is mild hazy density over the posterior base on the lateral film as cannot exclude atelectasis or infection. There is mild cardiomegaly slightly worse. There are degenerative changes of the spine.  IMPRESSION: Mild opacification of the posterior lung bases on the lateral film as cannot exclude atelectasis or infection.  Cardiomegaly.   Electronically Signed   By: Marin Olp M.D.  On: 01/13/2014 10:29   US Abdomen Limited Ruq  01/13/2014   CLINICAL DATA:  Abdominal pain.  EXAM: US ABDOMEN LIMITED - RIGHT UPPER QUADRANT  COMPARISON:  CT 12/03/2013  FINDINGS: Gallbladder:  No gallstones or wall thickening visualized. No sonographic Murphy sign noted.  Common bile duct:  Diameter: 4.4 mm.  Liver:  Increased echogenicity compatible with hepatic steatosis. 2 cm region of decreased echogenicity adjacent the gallbladder fossa likely focal fatty sparing.  IMPRESSION: No acute hepatobiliary findings.   Electronically Signed   By: Marin Olp M.D.   On: 01/13/2014 10:23     EKG Interpretation   Date/Time:  Wednesday January 13 2014 08:47:39 EDT Ventricular Rate:  82 PR Interval:  95 QRS Duration: 149 QT Interval:  484 QTC Calculation: 565 R Axis:   -32 Text Interpretation:  Age not entered, assumed to be  43 years old for  purpose of ECG interpretation Ectopic atrial rhythm Short PR interval  Nonspecific intraventricular conduction delay Borderline ST depression,  diffuse leads Similar to prior Confirmed by Mingo Amber  MD, [place] ([id number]) on  01/13/2014 9:00:27 AM      MDM   Final diagnoses:   Asthma exacerbation  Wheezing    Medications  cefTRIAXone (ROCEPHIN) 1 g in dextrose 5 % 50 mL IVPB (not administered)  ipratropium-albuterol (DUONEB) 0.5-2.5 (3) MG/3ML nebulizer solution 3 mL (3 mLs Nebulization Given 01/13/14 0757)  albuterol (PROVENTIL) (2.5 MG/3ML) 0.083% nebulizer solution 5 mg (5 mg Nebulization Given 01/13/14 0915)  sodium chloride 0.9 % bolus 500 mL (0 mLs Intravenous Stopped 01/13/14 1038)  methylPREDNISolone sodium succinate (SOLU-MEDROL) 125 mg/2 mL injection 125 mg (125 mg Intravenous Given 01/13/14 1038)  sodium chloride 0.9 % bolus 500 mL (500 mLs Intravenous New Bag/Given 01/13/14 1041)  magnesium sulfate IVPB 2 g 50 mL (2 g Intravenous New Bag/Given 01/13/14 1044)  albuterol (PROVENTIL,VENTOLIN) solution continuous neb (10 mg/hr Nebulization Given 01/13/14 1035)   Filed Vitals:   01/13/14 0914 01/13/14 0916 01/13/14 1046 01/13/14 1119  BP: 124/71  145/81   Pulse: 77  72   Temp:      TempSrc:      Resp: 26  26   Height:      Weight:      SpO2: 100% 99% 99% 100%   EKG noted normal sinus rhythm a heart rate of 82 beats per minute with a short PR interval-nonspecific intraventricular conduction identified-no previous EKG to compare. Troponin negative elevation. BNP negative elevation. CBC negative elevated white blood cell count-negative left shift or leukocytosis noted. Hemoglobin 13.8, hematocrit 42.7. CMP negative findings-BUN 7, creatinine 0.92. Glucose 204 with anion gap of 16.0 mEq per liter. Mild bump in AST, ALT identified-AST 52, ALT 53. Negative elevated alkaline phosphatase of bilirubin. Lipase negative elevation. Urine pregnancy negative. Urinalysis noted negative hemoglobin, negative nitrites or leukocytes identified. Negative pyuria noted in the sample. Right upper quadrant ultrasounds negative for gallstones or wall thickening-no acute hepatobiliary findings identified. Chest x-ray noted mild opacification of the posterior lung base on the lateral film  but cannot exclude atelectasis versus infection. Cardiomegaly identified. Patient presenting to the ED with asthma attack - patient has been given fluids, IV steroids, numerous breathing treatments, IV Magnesium - patient continues to have shortness of breath and expiratory wheezes on exam. Vitals are stable, pulse ox 99% on room air. CXR possible beginnings of pneumonia - empiric treatment to be started. Patient to be admitted to the hospital secondary to asthma exacerbation. This provider spoke with Dr. Ree Kida -  patient to be admitted to [place]. Discussed plan for admission with patient who agreed to plan of care and understood. Patient stable for transfer.   Jamse Mead, PA-C 01/13/14 1615

## 2014-01-13 NOTE — H&P (Signed)
[facility] History and Physical  Koral Thaden GHW:[id number] DOB: April 26, 1971 DOA: 01/13/2014  Referring physician:  PCP: Benito Mccreedy, MD  Specialists:   Chief Complaint: Shortness of breath, nausea, vomiting, diarrhea  HPI: Diana Henderson is a 43 y.o. female  With a history of diabetes mellitus, hypertension, asthma, depression, sleep apnea that presented to the emergency department with complaints of shortness of breath, nausea, vomiting, diarrhea. Patient states approximately 2 days ago she began to have shortness of breath along with cough nasal congestion. She began to feel as if she was coming down with a cold. Patient used her albuterol inhaler, however had no improvement in her shortness of breath. She denies any fever or chills at home. Patient also complains of nausea, vomiting, diarrhea which started last night. Patient had 3 episodes of vomiting in the past 12 hours. Patient states she's had 2 episodes of diarrhea. She denies blood with either. Patient denies any sick contacts recent travel. Patient denies any recent hospitalization for an exacerbation.  Review of Systems:  Constitutional: Denies fever, chills, diaphoresis, appetite change and fatigue.  HEENT: Complains of nasal congestion. Respiratory: Shortness of breath and cough for 2 days. Cardiovascular: Denies chest pain, palpitations and leg swelling.  Gastrointestinal: Complains of nausea, vomiting, diarrhea for one day. Genitourinary: Denies dysuria, urgency, frequency, hematuria, flank pain and difficulty urinating.  Musculoskeletal: Denies myalgias, back pain, joint swelling, arthralgias and gait problem.  Skin: Denies pallor, rash and wound.  Neurological: Denies dizziness, seizures, syncope, weakness, light-headedness, numbness and headaches.  Hematological: Denies adenopathy. Easy bruising, personal or family bleeding history  Psychiatric/Behavioral: Denies suicidal ideation, mood changes,  confusion, nervousness, sleep disturbance and agitation  Past Medical History  Diagnosis Date  . Diabetes mellitus   . Hypertension   . Myocardial infarction   . Asthma   . Hyperlipidemia   . Sleep apnea   . Depression   . PTSD (post-traumatic stress disorder)   . Pain in joint, ankle and foot 02/17/2013  . Anxiety   . Bipolar 1 disorder    Past Surgical History  Procedure Laterality Date  . Tubal ligation    . Midtarsal arthrodesis Right 07/17/12  . Remove implant deep Right 07/17/12  . Lapidus fusion Right 01/10/12  . Carpal tunnel release Left 2001    Left wrist  . Gastroc recession extremity Right 3//20/14    Right foot  . Hernia repair      1996   Social History: Patient lives at home with her family. She states she has not smoked in over 20 years. Patient denies any alcohol or illicit drug use. Patient states she is currently disabled due to her foot pain, however it tends [employer].  Allergies  Allergen Reactions  . Celery Oil Hives  . Latex Itching    Powdered latex gloves.      History reviewed. No pertinent family history.   Prior to Admission medications   Medication Sig Start Date End Date Taking? Authorizing Provider  albuterol (PROVENTIL HFA;VENTOLIN HFA) 108 (90 BASE) MCG/ACT inhaler Inhale 2 puffs into the lungs every 4 (four) hours as needed for wheezing or shortness of breath.   Yes Historical Provider, MD  amLODipine (NORVASC) 10 MG tablet Take 10 mg by mouth daily.   Yes Historical Provider, MD  atenolol (TENORMIN) 50 MG tablet Take 50 mg by mouth daily.   Yes Historical Provider, MD  Canagliflozin (INVOKANA) 300 MG TABS Take 300 mg by mouth daily.   Yes Historical Provider, MD  cyclobenzaprine (FLEXERIL)  10 MG tablet Take 10 mg by mouth 3 (three) times daily as needed for muscle spasms.    Yes Historical Provider, MD  DULoxetine (CYMBALTA) 60 MG capsule Take 60 mg by mouth daily.   Yes Historical Provider, MD  hydrOXYzine (ATARAX/VISTARIL) 50 MG  tablet Take 50 mg by mouth 3 (three) times daily as needed for anxiety.    Yes Historical Provider, MD  insulin glargine (LANTUS) 100 UNIT/ML injection Inject 25 Units into the skin at bedtime.    Yes Historical Provider, MD  insulin NPH-regular Human (NOVOLIN 70/30) (70-30) 100 UNIT/ML injection Inject 30 Units into the skin 2 (two) times daily with a meal.   Yes Historical Provider, MD  ketoprofen (ORUDIS) 75 MG capsule Take 75 mg by mouth 3 (three) times daily as needed for pain (pain).   Yes Historical Provider, MD  lisinopril (PRINIVIL,ZESTRIL) 20 MG tablet Take 20 mg by mouth 2 (two) times daily.    Yes Historical Provider, MD  LORazepam (ATIVAN) 1 MG tablet Take 1 tablet (1 mg total) by mouth 3 (three) times daily as needed for anxiety. 24/58/09  Yes Delora Fuel, MD  omeprazole (PRILOSEC) 20 MG capsule Take 1 capsule (20 mg total) by mouth daily. 07/07/13  Yes Carmin Muskrat, MD  orphenadrine (NORFLEX) 100 MG tablet Take 1 tablet (100 mg total) by mouth 2 (two) times daily as needed for muscle spasms (moderate pain). 12/03/13  Yes Bonnita Hollow, MD  oxyCODONE-acetaminophen (PERCOCET) 10-325 MG per tablet Take 1 tablet by mouth every 8 (eight) hours as needed for pain. 10/23/13  Yes Myeong Sheard, DPM  risperiDONE (RISPERDAL) 2 MG tablet Take 2 mg by mouth at bedtime.   Yes Historical Provider, MD  sucralfate (CARAFATE) 1 G tablet Take 1 g by mouth daily.   Yes Historical Provider, MD  tiotropium (SPIRIVA) 18 MCG inhalation capsule Place 18 mcg into inhaler and inhale daily.   Yes Historical Provider, MD   Physical Exam: Filed Vitals:   01/13/14 1046  BP: 145/81  Pulse: 72  Temp:   Resp: 26     General: Well developed, well nourished, NAD, appears stated age  HEENT: NCAT, PERRLA, EOMI, Anicteic Sclera, mucous membranes moist.   Neck: Supple, no JVD, no masses  Cardiovascular: S1 S2 auscultated, no rubs, murmurs or gallops. Regular rate and rhythm.  Respiratory: Diffuse expiratory  wheezing noted in all lung fields  Abdomen: Soft, obese, epigastric and umbilical tenderness, nondistended, + bowel sounds  Extremities: warm, dry without cyanosis clubbing or edema. Right foot and diabetic shoe  Neuro: AAOx3, cranial nerves grossly intact. Strength 5/5 in patient's upper and lower extremities bilaterally  Skin: Without rashes exudates or nodules  Psych: Normal affect and demeanor with intact judgement and insight  Labs on Admission:  Basic Metabolic Panel:  Recent Labs Lab 01/13/14 0845  NA 138  K 4.4  CL 99  CO2 23  GLUCOSE 204*  BUN 7  CREATININE 0.92  CALCIUM 9.1   Liver Function Tests:  Recent Labs Lab 01/13/14 0845  AST 52*  ALT 53*  ALKPHOS 78  BILITOT 0.4  PROT 7.2  ALBUMIN 3.7    Recent Labs Lab 01/13/14 0845  LIPASE 19   No results found for this basename: AMMONIA,  in the last 168 hours CBC:  Recent Labs Lab 01/13/14 0845  WBC 5.5  NEUTROABS 2.9  HGB 13.8  HCT 42.7  MCV 85.6  PLT 168   Cardiac Enzymes:  Recent Labs Lab 01/13/14 0845  TROPONINI <  0.30    BNP (last 3 results)  Recent Labs  07/07/13 1019 01/13/14 0845  PROBNP 9.6 13.3   CBG: No results found for this basename: GLUCAP,  in the last 168 hours  Radiological Exams on Admission: Dg Chest 2 View  01/13/2014   CLINICAL DATA:  Shortness of breath, asthma and emesis. Productive cough and cold symptoms.  EXAM: CHEST  2 VIEW  COMPARISON:  12/03/2013  FINDINGS: There is motion artifact on the lateral image. Lungs are adequately inflated as there is mild hazy density over the posterior base on the lateral film as cannot exclude atelectasis or infection. There is mild cardiomegaly slightly worse. There are degenerative changes of the spine.  IMPRESSION: Mild opacification of the posterior lung bases on the lateral film as cannot exclude atelectasis or infection.  Cardiomegaly.   Electronically Signed   By: Marin Olp M.D.   On: 01/13/2014 10:29   US Abdomen  Limited Ruq  01/13/2014   CLINICAL DATA:  Abdominal pain.  EXAM: US ABDOMEN LIMITED - RIGHT UPPER QUADRANT  COMPARISON:  CT 12/03/2013  FINDINGS: Gallbladder:  No gallstones or wall thickening visualized. No sonographic Murphy sign noted.  Common bile duct:  Diameter: 4.4 mm.  Liver:  Increased echogenicity compatible with hepatic steatosis. 2 cm region of decreased echogenicity adjacent the gallbladder fossa likely focal fatty sparing.  IMPRESSION: No acute hepatobiliary findings.   Electronically Signed   By: Marin Olp M.D.   On: 01/13/2014 10:23    EKG: Independently reviewed. Sinus rhythm, rate 82  Assessment/Plan  Asthma Exacerbation possibly secondary to community-acquired pneumonia -Patient be admitted to medical floor. -Chest x-ray: Mild opacification of the posterior lung bases on the lateral film, cannot exclude atelectasis or infection -Will place patient on azithromycin and ceftriaxone, DuoNeb treatments, and oxygen to maintain her sats above 92%, Mucinex, steriods -Will try to obtain sputum culture and Gram stain, urine Legionella and strep pneumonia antigens -Will obtain repeat chest x-ray in the morning.  Nausea, vomiting, diarrhea -Possibly secondary to viral gastroenteritis -Will place patient on antiemetics as needed along with IV fluid -Patient wishes to try to eat, we'll place her on a carb modified heart healthy diet -Will obtain GI pathogen panel -Ultrasound the abdomen showed no acute hepatobiliary findings.  Diabetes mellitus type 2 -Will obtain hemoglobin A1c -Will continue patient's home regimen along with insulin sliding scale and CBG monitoring  Essential Hypertension -Continue amlodipine, Tenormin, lisinopril  Depression -Continue Cymbalta and Risperdal  Chronic foot pain -Continue home regimen of Percocet, Norflex, orudis, Flexeril  Sleep apnea -Ordered CPAP at bedtime  DVT prophylaxis: Lovenox  Code Status: Full  Condition:  Guarded  Family Communication: None at bedside. Admission, patients condition and plan of care including tests being ordered have been discussed with the patient, who indicates understanding and agreed with the plan and Code Status.  Disposition Plan: Admitted   Time spent: 60 minutes  Scarlett Portlock D.O. [facility] Pager [phone]  If 7PM-7AM, please contact night-coverage [email or web address] Password [facility] 01/13/2014, 11:52 AM

## 2014-01-13 NOTE — Progress Notes (Signed)
ANTIBIOTIC CONSULT NOTE - INITIAL  Pharmacy Consult for Azithromycin Indication: CAP  Allergies  Allergen Reactions  . Celery Oil Hives  . Latex Itching    Powdered latex gloves.      Patient Measurements: Height: 5\' 6"  (167.6 cm) Weight: 269 lb (122.018 kg) IBW/kg (Calculated) : 59.3  Vital Signs: Temp: 98.7 F (37.1 C) (07/29 0748) Temp src: Oral (07/29 0748) BP: 145/81 mmHg (07/29 1046) Pulse Rate: 72 (07/29 1046) Intake/Output from previous day:   Intake/Output from this shift:    Labs:  Recent Labs  01/13/14 0845  WBC 5.5  HGB 13.8  PLT 168  CREATININE 0.92   Estimated Creatinine Clearance: 102.9 ml/min (by C-G formula based on Cr of 0.92). No results found for this basename: VANCOTROUGH, VANCOPEAK, VANCORANDOM, GENTTROUGH, GENTPEAK, GENTRANDOM, TOBRATROUGH, TOBRAPEAK, TOBRARND, AMIKACINPEAK, AMIKACINTROU, AMIKACIN,  in the last 72 hours   Microbiology: No results found for this or any previous visit (from the past 720 hour(s)).  Medical History: Past Medical History  Diagnosis Date  . Diabetes mellitus   . Hypertension   . Myocardial infarction   . Asthma   . Hyperlipidemia   . Sleep apnea   . Depression   . PTSD (post-traumatic stress disorder)   . Pain in joint, ankle and foot 02/17/2013  . Anxiety   . Bipolar 1 disorder     Assessment: 19 YOF who presented to the [facility] on 7/29 with SOB and nasal congestion. The patient has history of asthma. CXR showed mild opacification - infection could not be ruled out. Pharmacy was consulted to start Azithromycin along with Rocephin for r/o CAP.   Goal of Therapy:  Proper antibiotics for infection/cultures adjusted for renal/hepatic function   Plan:  1. Azithromycin 500 mg IV every 24 hours 2. Pharmacy will sign off as no further dose adjustments expected  Alycia Rossetti, PharmD, BCPS Clinical Pharmacist Pager: [phone] 01/13/2014 1:01 PM

## 2014-01-13 NOTE — ED Notes (Signed)
Pt reports having asthma and sob, productive cough and cold symptoms, no relief with inhalers at home. Pt able to speak in full sentences at triage, spo2 99%. Having n/v last night and this am.

## 2014-01-14 LAB — BASIC METABOLIC PANEL
Anion gap: 14 (ref 5–15)
BUN: 9 mg/dL (ref 6–23)
CO2: 23 mEq/L (ref 19–32)
CREATININE: 0.89 mg/dL (ref 0.50–1.10)
Calcium: 8.7 mg/dL (ref 8.4–10.5)
Chloride: 100 mEq/L (ref 96–112)
GFR calc Af Amer: 89 mL/min — ABNORMAL LOW (ref >90)
GFR calc non Af Amer: 77 mL/min — ABNORMAL LOW (ref >90)
Glucose, Bld: 219 mg/dL — ABNORMAL HIGH (ref 70–99)
Potassium: 4.5 mEq/L (ref 3.7–5.3)
Sodium: 137 mEq/L (ref 137–147)

## 2014-01-14 LAB — CBC
HCT: 40.4 % (ref 36.0–46.0)
Hemoglobin: 13 g/dL (ref 12.0–15.0)
MCH: 26.9 pg (ref 26.0–34.0)
MCHC: 32.2 g/dL (ref 30.0–36.0)
MCV: 83.6 fL (ref 78.0–100.0)
Platelets: 178 10*3/uL (ref 150–400)
RBC: 4.83 MIL/uL (ref 3.87–5.11)
RDW: 13.5 % (ref 11.5–15.5)
WBC: 12.7 10*3/uL — ABNORMAL HIGH (ref 4.0–10.5)

## 2014-01-14 LAB — GLUCOSE, CAPILLARY
GLUCOSE-CAPILLARY: 170 mg/dL — AB (ref 70–99)
Glucose-Capillary: 160 mg/dL — ABNORMAL HIGH (ref 70–99)
Glucose-Capillary: 184 mg/dL — ABNORMAL HIGH (ref 70–99)
Glucose-Capillary: 221 mg/dL — ABNORMAL HIGH (ref 70–99)
Glucose-Capillary: 252 mg/dL — ABNORMAL HIGH (ref 70–99)
Glucose-Capillary: 297 mg/dL — ABNORMAL HIGH (ref 70–99)

## 2014-01-14 LAB — LEGIONELLA ANTIGEN, URINE: Legionella Antigen, Urine: NEGATIVE

## 2014-01-14 LAB — STREP PNEUMONIAE URINARY ANTIGEN: STREP PNEUMO URINARY ANTIGEN: NEGATIVE

## 2014-01-14 LAB — HIV ANTIBODY (ROUTINE TESTING W REFLEX): HIV 1&2 Ab, 4th Generation: NONREACTIVE

## 2014-01-14 MED ORDER — PNEUMOCOCCAL VAC POLYVALENT 25 MCG/0.5ML IJ INJ
0.5000 mL | INJECTION | INTRAMUSCULAR | Status: AC
  Administered 2014-01-15: 0.5 mL via INTRAMUSCULAR
  Filled 2014-01-14: qty 0.5

## 2014-01-14 MED ORDER — INSULIN ASPART 100 UNIT/ML ~~LOC~~ SOLN
0.0000 [IU] | SUBCUTANEOUS | Status: DC
Start: 2014-01-14 — End: 2014-01-16
  Administered 2014-01-14: 3 [IU] via SUBCUTANEOUS
  Administered 2014-01-14: 8 [IU] via SUBCUTANEOUS
  Administered 2014-01-14: 5 [IU] via SUBCUTANEOUS
  Administered 2014-01-14: 3 [IU] via SUBCUTANEOUS
  Administered 2014-01-14: 8 [IU] via SUBCUTANEOUS
  Administered 2014-01-14 – 2014-01-15 (×2): 3 [IU] via SUBCUTANEOUS
  Administered 2014-01-15 (×4): 2 [IU] via SUBCUTANEOUS

## 2014-01-14 MED ORDER — ENOXAPARIN SODIUM 80 MG/0.8ML ~~LOC~~ SOLN
0.5000 mg/kg | SUBCUTANEOUS | Status: DC
  Administered 2014-01-14 – 2014-01-15 (×2): 70 mg via SUBCUTANEOUS
  Filled 2014-01-14 (×4): qty 0.8

## 2014-01-14 NOTE — Progress Notes (Signed)
Brought patient a new CPAP. Auto settings of 20 max/4 min with 2L bled in. Patient tolerating well.

## 2014-01-14 NOTE — ED Provider Notes (Signed)
Medical screening examination/treatment/procedure(s) were conducted as a shared visit with non-physician practitioner(s) and myself.  I personally evaluated the patient during the encounter.   EKG Interpretation   Date/Time:  Wednesday January 13 2014 08:47:39 EDT Ventricular Rate:  82 PR Interval:  95 QRS Duration: 149 QT Interval:  484 QTC Calculation: 565 R Axis:   -32 Text Interpretation:  Age not entered, assumed to be  43 years old for  purpose of ECG interpretation Ectopic atrial rhythm Short PR interval  Nonspecific intraventricular conduction delay Borderline ST depression,  diffuse leads Similar to prior Confirmed by Mingo Amber  MD, Prentiss ([id number]) on  01/13/2014 9:00:27 AM       CRITICAL CARE Performed by: Osvaldo Shipper   Total critical care time: 30 minutes  Critical care time was exclusive of separately billable procedures and treating other patients.  Critical care was necessary to treat or prevent imminent or life-threatening deterioration.  Critical care was time spent personally by me on the following activities: development of treatment plan with patient and/or surrogate as well as nursing, discussions with consultants, evaluation of patient's response to treatment, examination of patient, obtaining history from patient or surrogate, ordering and performing treatments and interventions, ordering and review of laboratory studies, ordering and review of radiographic studies, pulse oximetry and re-evaluation of patient's condition.   Patient here with asthma exacerbation. After 1 hour long albuterol treatment, still having severe wheezing and dyspnea. 2nd hour long albuterol treatment given, magnesium given. Admitted.  Osvaldo Shipper, MD 01/14/14 [phone]

## 2014-01-14 NOTE — Progress Notes (Signed)
PROGRESS NOTE  Diana Henderson BZJ:[id number] DOB: April 06, 1971 DOA: 01/13/2014 PCP: Benito Mccreedy, MD  Subjective/ 24 H Interval events - feels better this morning  Assessment/Plan: Asthma Exacerbation possibly secondary to community-acquired pneumonia  - slight improvement in her respiratory status, still wheezing this morning - Chest x-ray: Mild opacification of the posterior lung bases on the lateral film, cannot exclude atelectasis or infection  - sputum culture and Gram stain, urine Legionella and strep pneumonia antigens  Nausea, vomiting, diarrhea  - some chronic component, improving.  - Ultrasound the abdomen showed no acute hepatobiliary findings.  Diabetes mellitus type 2  - A1c 7.8 - Will continue patient's home regimen along with insulin sliding scale and CBG monitoring  Essential Hypertension  - Continue amlodipine, Tenormin, lisinopril  Depression  - Continue Cymbalta and Risperdal  Chronic foot pain  - Continue home regimen of Percocet, Norflex, orudis, Flexeril  Sleep apnea  - Ordered CPAP at bedtime  Diet: heart  Fluids: NS DVT Prophylaxis: Lovenox  Code Status: Full Family Communication: none  Disposition Plan: home when ready   Consultants:  None   Procedures:  None    Antibiotics Ceftriaxone 7/29 >> Azithromycin 7/29 >>  Studies  Filed Vitals:   01/13/14 2039 01/14/14 0500 01/14/14 0946 01/14/14 0957  BP: 172/90 129/62 122/74   Pulse: 98 85 82   Temp: 98.3 F (36.8 C) 97.7 F (36.5 C) 97.7 F (36.5 C)   TempSrc: Oral Oral Oral   Resp: 20 20 21    Height:      Weight:      SpO2: 98% 94% 96% 96%    Intake/Output Summary (Last 24 hours) at 01/14/14 1113 Last data filed at 01/14/14 0000  Gross per 24 hour  Intake    620 ml  Output    750 ml  Net   -130 ml   Filed Weights   01/13/14 0748 01/13/14 1331  Weight: 122.018 kg (269 lb) 135.1 kg (297 lb 13.5 oz)    Exam:  General:  NAD  Cardiovascular: RRR  Respiratory:  scattered wheezing  Abdomen: non tender, soft  MSK: no edema  Neuro: non focal  Data Reviewed: Basic Metabolic Panel:  Recent Labs Lab 01/13/14 0845 01/13/14 1525 01/14/14 0714  NA 138  --  137  K 4.4  --  4.5  CL 99  --  100  CO2 23  --  23  GLUCOSE 204*  --  219*  BUN 7  --  9  CREATININE 0.92 0.84 0.89  CALCIUM 9.1  --  8.7   Liver Function Tests:  Recent Labs Lab 01/13/14 0845  AST 52*  ALT 53*  ALKPHOS 78  BILITOT 0.4  PROT 7.2  ALBUMIN 3.7    Recent Labs Lab 01/13/14 0845  LIPASE 19   No results found for this basename: AMMONIA,  in the last 168 hours CBC:  Recent Labs Lab 01/13/14 0845 01/13/14 1525 01/14/14 0714  WBC 5.5 7.5 12.7*  NEUTROABS 2.9  --   --   HGB 13.8 13.8 13.0  HCT 42.7 42.5 40.4  MCV 85.6 84.3 83.6  PLT 168 174 178   Cardiac Enzymes:  Recent Labs Lab 01/13/14 0845  TROPONINI <0.30   BNP (last 3 results)  Recent Labs  07/07/13 1019 01/13/14 0845  PROBNP 9.6 13.3   CBG:  Recent Labs Lab 01/13/14 1751 01/13/14 2033 01/14/14 0013 01/14/14 0401 01/14/14 0817  GLUCAP 394* 314* 297* 252* 221*    Recent Results (from the  past 240 hour(s))  CULTURE, BLOOD (ROUTINE X 2)     Status: None   Collection Time    01/13/14  3:10 PM      Result Value Ref Range Status   Specimen Description BLOOD RIGHT ARM   Final   Special Requests BOTTLES DRAWN AEROBIC AND ANAEROBIC 10CC   Final   Culture  Setup Time     Final   Value: 01/13/2014 18:50     Performed at [facility]   Culture     Final   Value:        BLOOD CULTURE RECEIVED NO GROWTH TO DATE CULTURE WILL BE HELD FOR 5 DAYS BEFORE ISSUING A FINAL NEGATIVE REPORT     Performed at [facility]   Report Status PENDING   Incomplete  CULTURE, BLOOD (ROUTINE X 2)     Status: None   Collection Time    01/13/14  3:29 PM      Result Value Ref Range Status   Specimen Description BLOOD LEFT HAND   Final   Special Requests BOTTLES DRAWN AEROBIC ONLY 5CC    Final   Culture  Setup Time     Final   Value: 01/13/2014 18:50     Performed at [facility]   Culture     Final   Value:        BLOOD CULTURE RECEIVED NO GROWTH TO DATE CULTURE WILL BE HELD FOR 5 DAYS BEFORE ISSUING A FINAL NEGATIVE REPORT     Performed at [facility]   Report Status PENDING   Incomplete     Studies: Dg Chest 2 View  01/13/2014   CLINICAL DATA:  Shortness of breath, asthma and emesis. Productive cough and cold symptoms.  EXAM: CHEST  2 VIEW  COMPARISON:  12/03/2013  FINDINGS: There is motion artifact on the lateral image. Lungs are adequately inflated as there is mild hazy density over the posterior base on the lateral film as cannot exclude atelectasis or infection. There is mild cardiomegaly slightly worse. There are degenerative changes of the spine.  IMPRESSION: Mild opacification of the posterior lung bases on the lateral film as cannot exclude atelectasis or infection.  Cardiomegaly.   Electronically Signed   By: Marin Olp M.D.   On: 01/13/2014 10:29   US Abdomen Limited Ruq  01/13/2014   CLINICAL DATA:  Abdominal pain.  EXAM: US ABDOMEN LIMITED - RIGHT UPPER QUADRANT  COMPARISON:  CT 12/03/2013  FINDINGS: Gallbladder:  No gallstones or wall thickening visualized. No sonographic Murphy sign noted.  Common bile duct:  Diameter: 4.4 mm.  Liver:  Increased echogenicity compatible with hepatic steatosis. 2 cm region of decreased echogenicity adjacent the gallbladder fossa likely focal fatty sparing.  IMPRESSION: No acute hepatobiliary findings.   Electronically Signed   By: Marin Olp M.D.   On: 01/13/2014 10:23    Scheduled Meds: . amLODipine  10 mg Oral Daily  . atenolol  50 mg Oral Daily  . azithromycin  500 mg Intravenous Q24H  . Canagliflozin  300 mg Oral Daily  . cefTRIAXone (ROCEPHIN)  IV  1 g Intravenous Q24H  . DULoxetine  60 mg Oral Daily  . enoxaparin (LOVENOX) injection  0.5 mg/kg Subcutaneous Q24H  . guaiFENesin  1,200 mg Oral BID    . insulin aspart  0-15 Units Subcutaneous 6 times per day  . insulin aspart protamine- aspart  30 Units Subcutaneous BID WC  . ipratropium-albuterol  3 mL Nebulization QID  .  lisinopril  20 mg Oral BID  . methylPREDNISolone (SOLU-MEDROL) injection  60 mg Intravenous Q12H  . pantoprazole  40 mg Oral Daily  . [START ON 01/15/2014] pneumococcal 23 valent vaccine  0.5 mL Intramuscular Tomorrow-1000  . risperiDONE  2 mg Oral QHS  . sucralfate  1 g Oral Daily  . tiotropium  18 mcg Inhalation Daily   Continuous Infusions: . sodium chloride 75 mL/hr at 01/13/14 2354    Principal Problem:   Asthma exacerbation Active Problems:   Major depressive disorder, recurrent, with catatonic features   Abdominal pain, unspecified site   Essential hypertension   Diabetes mellitus   Time spent: 25  This note has been created with Surveyor, quantity. Any transcriptional errors are unintentional.   Marzetta Board, MD [facility] Pager [phone]. If 7 PM - 7 AM, please contact night-coverage at [email or web address], password [facility] 01/14/2014, 11:13 AM  LOS: 1 day

## 2014-01-14 NOTE — Progress Notes (Signed)
RT applied CPAP for patient on Auto of max 20cmH2O and min 4 cmH2O with 2 L of O2 bled in. Patient is tolerating CPAP at this time. RT will continue to monitor.

## 2014-01-15 LAB — GLUCOSE, CAPILLARY
Glucose-Capillary: 120 mg/dL — ABNORMAL HIGH (ref 70–99)
Glucose-Capillary: 128 mg/dL — ABNORMAL HIGH (ref 70–99)
Glucose-Capillary: 133 mg/dL — ABNORMAL HIGH (ref 70–99)
Glucose-Capillary: 134 mg/dL — ABNORMAL HIGH (ref 70–99)
Glucose-Capillary: 152 mg/dL — ABNORMAL HIGH (ref 70–99)
Glucose-Capillary: 166 mg/dL — ABNORMAL HIGH (ref 70–99)
Glucose-Capillary: 175 mg/dL — ABNORMAL HIGH (ref 70–99)

## 2014-01-15 LAB — GI PATHOGEN PANEL BY PCR, STOOL
C difficile toxin A/B: NEGATIVE
CRYPTOSPORIDIUM BY PCR: NEGATIVE
Campylobacter by PCR: NEGATIVE
E coli (ETEC) LT/ST: NEGATIVE
E coli (STEC): NEGATIVE
E coli 0157 by PCR: NEGATIVE
G lamblia by PCR: NEGATIVE
Norovirus GI/GII: NEGATIVE
ROTAVIRUS A BY PCR: NEGATIVE
SALMONELLA BY PCR: NEGATIVE
SHIGELLA BY PCR: NEGATIVE

## 2014-01-15 MED ORDER — CHLORHEXIDINE GLUCONATE 0.12 % MT SOLN
15.0000 mL | Freq: Two times a day (BID) | OROMUCOSAL | Status: DC
  Administered 2014-01-15 – 2014-01-16 (×2): 15 mL via OROMUCOSAL
  Filled 2014-01-15 (×5): qty 15

## 2014-01-15 MED ORDER — PREDNISONE 20 MG PO TABS
40.0000 mg | ORAL_TABLET | Freq: Every day | ORAL | Status: DC
  Administered 2014-01-15 – 2014-01-16 (×2): 40 mg via ORAL
  Filled 2014-01-15 (×3): qty 2

## 2014-01-15 MED ORDER — CETYLPYRIDINIUM CHLORIDE 0.05 % MT LIQD
7.0000 mL | Freq: Two times a day (BID) | OROMUCOSAL | Status: DC
  Administered 2014-01-15 – 2014-01-16 (×3): 7 mL via OROMUCOSAL

## 2014-01-15 MED ORDER — LEVOFLOXACIN 750 MG PO TABS
750.0000 mg | ORAL_TABLET | Freq: Every day | ORAL | Status: DC
  Administered 2014-01-15 – 2014-01-16 (×2): 750 mg via ORAL
  Filled 2014-01-15 (×2): qty 1

## 2014-01-15 NOTE — Progress Notes (Signed)
PROGRESS NOTE  Diana Henderson DXI:[id number] DOB: 1971-03-31 DOA: 01/13/2014 PCP: Benito Mccreedy, MD  Subjective/ 24 H Interval events - feels better this morning  Assessment/Plan: Asthma Exacerbation possibly secondary to community-acquired pneumonia  - still wheezing this morning, feels shortwinded - Chest x-ray: Mild opacification of the posterior lung bases on the lateral film, cannot exclude atelectasis or infection  - sputum culture and Gram stain pending, urine Legionella and strep pneumonia antigens negative  - narrow to levofloxacin today - convert steroids to oral - d/c spiriva Nausea, vomiting, diarrhea  - some chronic component, improving.  - Ultrasound the abdomen showed no acute hepatobiliary findings.  - gi pathogen panel pending Diabetes mellitus type 2  - A1c 7.8 - Will continue patient's home regimen along with insulin sliding scale and CBG monitoring  Essential Hypertension  - Continue amlodipine, Tenormin, lisinopril  Depression  - Continue Cymbalta and Risperdal  Chronic foot pain  - Continue home regimen of Percocet, Norflex, orudis, Flexeril  Sleep apnea  - Ordered CPAP at bedtime  Diet: heart  Fluids: NS DVT Prophylaxis: Lovenox  Code Status: Full Family Communication: none  Disposition Plan: home when ready  Consultants:  None   Procedures:  None    Antibiotics Ceftriaxone 7/29 >> 7/31 Azithromycin 7/29 >> 7/31 Levofloxacin 7/31 >>  Studies  Filed Vitals:   01/14/14 1656 01/14/14 1710 01/14/14 2045 01/15/14 0415  BP:  108/76 104/72 145/82  Pulse:  71 75 67  Temp:  98.3 F (36.8 C) 97.9 F (36.6 C) 97.7 F (36.5 C)  TempSrc:  Oral Oral Oral  Resp:  23 20 20   Height:      Weight:   136 kg (299 lb 13.2 oz) 136 kg (299 lb 13.2 oz)  SpO2: 98% 98% 97% 96%    Intake/Output Summary (Last 24 hours) at 01/15/14 0928 Last data filed at 01/15/14 0426  Gross per 24 hour  Intake      0 ml  Output   2950 ml  Net  -2950 ml     Filed Weights   01/13/14 1331 01/14/14 2045 01/15/14 0415  Weight: 135.1 kg (297 lb 13.5 oz) 136 kg (299 lb 13.2 oz) 136 kg (299 lb 13.2 oz)    Exam:  General:  NAD  Cardiovascular: RRR  Respiratory: scattered wheezing, improved since yesterday  Abdomen: non tender, soft  MSK: no edema  Data Reviewed: Basic Metabolic Panel:  Recent Labs Lab 01/13/14 0845 01/13/14 1525 01/14/14 0714  NA 138  --  137  K 4.4  --  4.5  CL 99  --  100  CO2 23  --  23  GLUCOSE 204*  --  219*  BUN 7  --  9  CREATININE 0.92 0.84 0.89  CALCIUM 9.1  --  8.7   Liver Function Tests:  Recent Labs Lab 01/13/14 0845  AST 52*  ALT 53*  ALKPHOS 78  BILITOT 0.4  PROT 7.2  ALBUMIN 3.7    Recent Labs Lab 01/13/14 0845  LIPASE 19   CBC:  Recent Labs Lab 01/13/14 0845 01/13/14 1525 01/14/14 0714  WBC 5.5 7.5 12.7*  NEUTROABS 2.9  --   --   HGB 13.8 13.8 13.0  HCT 42.7 42.5 40.4  MCV 85.6 84.3 83.6  PLT 168 174 178   Cardiac Enzymes:  Recent Labs Lab 01/13/14 0845  TROPONINI <0.30   BNP (last 3 results)  Recent Labs  07/07/13 1019 01/13/14 0845  PROBNP 9.6 13.3   CBG:  Recent Labs Lab 01/14/14 1235 01/14/14 1712 01/14/14 2015 01/15/14 0007 01/15/14 0412  GLUCAP 160* 170* 184* 134* 133*    Recent Results (from the past 240 hour(s))  CULTURE, BLOOD (ROUTINE X 2)     Status: None   Collection Time    01/13/14  3:10 PM      Result Value Ref Range Status   Specimen Description BLOOD RIGHT ARM   Final   Special Requests BOTTLES DRAWN AEROBIC AND ANAEROBIC 10CC   Final   Culture  Setup Time     Final   Value: 01/13/2014 18:50     Performed at [facility]   Culture     Final   Value:        BLOOD CULTURE RECEIVED NO GROWTH TO DATE CULTURE WILL BE HELD FOR 5 DAYS BEFORE ISSUING A FINAL NEGATIVE REPORT     Performed at [facility]   Report Status PENDING   Incomplete  CULTURE, BLOOD (ROUTINE X 2)     Status: None   Collection Time     01/13/14  3:29 PM      Result Value Ref Range Status   Specimen Description BLOOD LEFT HAND   Final   Special Requests BOTTLES DRAWN AEROBIC ONLY 5CC   Final   Culture  Setup Time     Final   Value: 01/13/2014 18:50     Performed at [facility]   Culture     Final   Value:        BLOOD CULTURE RECEIVED NO GROWTH TO DATE CULTURE WILL BE HELD FOR 5 DAYS BEFORE ISSUING A FINAL NEGATIVE REPORT     Performed at [facility]   Report Status PENDING   Incomplete     Studies: No results found.  Scheduled Meds: . amLODipine  10 mg Oral Daily  . atenolol  50 mg Oral Daily  . Canagliflozin  300 mg Oral Daily  . DULoxetine  60 mg Oral Daily  . enoxaparin (LOVENOX) injection  0.5 mg/kg Subcutaneous Q24H  . guaiFENesin  1,200 mg Oral BID  . insulin aspart  0-15 Units Subcutaneous 6 times per day  . insulin aspart protamine- aspart  30 Units Subcutaneous BID WC  . ipratropium-albuterol  3 mL Nebulization QID  . levofloxacin  750 mg Oral Daily  . lisinopril  20 mg Oral BID  . pantoprazole  40 mg Oral Daily  . pneumococcal 23 valent vaccine  0.5 mL Intramuscular Tomorrow-1000  . predniSONE  40 mg Oral Q breakfast  . risperiDONE  2 mg Oral QHS  . sucralfate  1 g Oral Daily  . tiotropium  18 mcg Inhalation Daily   Continuous Infusions:    Principal Problem:   Asthma exacerbation Active Problems:   Major depressive disorder, recurrent, with catatonic features   Abdominal pain, unspecified site   Essential hypertension   Diabetes mellitus  Time spent: 25  This note has been created with Surveyor, quantity. Any transcriptional errors are unintentional.   Marzetta Board, MD [facility] Pager [phone]. If 7 PM - 7 AM, please contact night-coverage at [email or web address], password [facility] 01/15/2014, 9:28 AM  LOS: 2 days

## 2014-01-15 NOTE — Progress Notes (Signed)
RT applied CPAP for patient on Auto with a maximum of 20cmH2O and minimum of 4 cmH2O and 2 L of O2 bled in.  Patient is tolerating the CPAP at this time.  RT will continue to monitor.

## 2014-01-16 LAB — GLUCOSE, CAPILLARY
GLUCOSE-CAPILLARY: 104 mg/dL — AB (ref 70–99)
GLUCOSE-CAPILLARY: 137 mg/dL — AB (ref 70–99)
Glucose-Capillary: 99 mg/dL (ref 70–99)
Glucose-Capillary: 168 mg/dL — ABNORMAL HIGH (ref 70–99)

## 2014-01-16 MED ORDER — PREDNISONE 20 MG PO TABS: 40.0000 mg | ORAL_TABLET | Freq: Every day | ORAL | Status: DC

## 2014-01-16 MED ORDER — LEVOFLOXACIN 750 MG PO TABS: 750.0000 mg | ORAL_TABLET | Freq: Every day | ORAL | Status: DC

## 2014-01-16 NOTE — Discharge Instructions (Signed)

## 2014-01-16 NOTE — Discharge Summary (Signed)
Physician Discharge Summary  Diana Henderson MBW:[id number] DOB: 09-21-1970 DOA: 01/13/2014  PCP: Diana Mccreedy, MD  Admit date: 01/13/2014 Discharge date: 01/16/2014  Time spent: 35 minutes  Recommendations for Outpatient Follow-up:  1. Follow up with Dr. Vista Henderson in a week   Discharge Diagnoses:  Principal Problem:   Asthma exacerbation Active Problems:   Major depressive disorder, recurrent, with catatonic features   Abdominal pain, unspecified site   Essential hypertension   Diabetes mellitus  Discharge Condition: stable  Diet recommendation: regular   Filed Weights   01/13/14 1331 01/14/14 2045 01/15/14 0415  Weight: 135.1 kg (297 lb 13.5 oz) 136 kg (299 lb 13.2 oz) 136 kg (299 lb 13.2 oz)   History of present illness:  Diana Henderson is a 43 y.o. female With a history of diabetes mellitus, hypertension, asthma, depression, sleep apnea that presented to the emergency department with complaints of shortness of breath, nausea, vomiting, diarrhea. Patient states approximately 2 days ago she began to have shortness of breath along with cough nasal congestion. She began to feel as if she was coming down with a cold. Patient used her albuterol inhaler, however had no improvement in her shortness of breath. She denies any fever or chills at home. Patient also complains of nausea, vomiting, diarrhea which started last night. Patient had 3 episodes of vomiting in the past 12 hours. Patient states she's had 2 episodes of diarrhea. She denies blood with either. Patient denies any sick contacts recent travel. Patient denies any recent hospitalization for an exacerbation.  Hospital Course:  Asthma Exacerbation possibly secondary to community-acquired pneumonia, suspect also underlying COPD - patient was admitted with probable CAP based on CXR and asthma exacerbation. She was initially started on Ceftriaxone and Azithromycin and IV steroids as well as nebulizer treatments. She  gradually improved and was transitioned to po Levofloxacin and po Prednisone with continued improvement. On the day of discharge her wheezing has resolved and patient was able to ambulate in the hallway on room air without any respiratory discomfort. Once her acute issues resolve she should have formal PFT evaluation as an outpatient.  Nausea, vomiting, diarrhea - some chronic component, resolved and tolerating diet. Ultrasound the abdomen showed no acute hepatobiliary findings.  Diabetes mellitus type 2 - A1c 7.8  Essential Hypertension - Continue amlodipine, Tenormin, lisinopril  Depression - Continue Cymbalta and Risperdal  Chronic foot pain - Continue home regimen of Percocet, Norflex, orudis, Flexeril. She is under disability for that. Sleep apnea - Ordered CPAP at bedtime  Procedures:  None    Consultations:  None   Discharge Exam: Filed Vitals:   01/16/14 0405 01/16/14 0900 01/16/14 0906 01/16/14 1636  BP: 111/63 115/71  111/70  Pulse: 83 67  67  Temp: 97.9 F (36.6 C) 97.6 F (36.4 C)  97.8 F (36.6 C)  TempSrc:  Oral  Oral  Resp: 20 20  20   Height:      Weight:      SpO2: 100% 100% 93% 100%   General: NAD Cardiovascular: RRR Respiratory: CTA biL, decreased breath sounds overall  Discharge Instructions    Medication List         albuterol 108 (90 BASE) MCG/ACT inhaler  Commonly known as:  PROVENTIL HFA;VENTOLIN HFA  Inhale 2 puffs into the lungs every 4 (four) hours as needed for wheezing or shortness of breath.     amLODipine 10 MG tablet  Commonly known as:  NORVASC  Take 10 mg by mouth daily.  atenolol 50 MG tablet  Commonly known as:  TENORMIN  Take 50 mg by mouth daily.     cyclobenzaprine 10 MG tablet  Commonly known as:  FLEXERIL  Take 10 mg by mouth 3 (three) times daily as needed for muscle spasms.     DULoxetine 60 MG capsule  Commonly known as:  CYMBALTA  Take 60 mg by mouth daily.     hydrOXYzine 50 MG tablet  Commonly known as:   ATARAX/VISTARIL  Take 50 mg by mouth 3 (three) times daily as needed for anxiety.     insulin NPH-regular Human (70-30) 100 UNIT/ML injection  Commonly known as:  NOVOLIN 70/30  Inject 30 Units into the skin 2 (two) times daily with a meal.     INVOKANA 300 MG Tabs  Generic drug:  Canagliflozin  Take 300 mg by mouth daily.     ketoprofen 75 MG capsule  Commonly known as:  ORUDIS  Take 75 mg by mouth 3 (three) times daily as needed for pain (pain).     levofloxacin 750 MG tablet  Commonly known as:  LEVAQUIN  Take 1 tablet (750 mg total) by mouth daily.     lisinopril 20 MG tablet  Commonly known as:  PRINIVIL,ZESTRIL  Take 20 mg by mouth 2 (two) times daily.     LORazepam 1 MG tablet  Commonly known as:  ATIVAN  Take 1 tablet (1 mg total) by mouth 3 (three) times daily as needed for anxiety.     omeprazole 20 MG capsule  Commonly known as:  PRILOSEC  Take 1 capsule (20 mg total) by mouth daily.     orphenadrine 100 MG tablet  Commonly known as:  NORFLEX  Take 1 tablet (100 mg total) by mouth 2 (two) times daily as needed for muscle spasms (moderate pain).     oxyCODONE-acetaminophen 10-325 MG per tablet  Commonly known as:  PERCOCET  Take 1 tablet by mouth every 8 (eight) hours as needed for pain.     predniSONE 20 MG tablet  Commonly known as:  DELTASONE  Take 2 tablets (40 mg total) by mouth daily with breakfast. 2 tablets daily for 2 days then 1 tablet daily for 2 days then stop     risperiDONE 2 MG tablet  Commonly known as:  RISPERDAL  Take 2 mg by mouth at bedtime.     sucralfate 1 G tablet  Commonly known as:  CARAFATE  Take 1 g by mouth daily.     tiotropium 18 MCG inhalation capsule  Commonly known as:  SPIRIVA  Place 18 mcg into inhaler and inhale daily.           Follow-up Information   Follow up with OSEI-BONSU,GEORGE, MD. Schedule an appointment as soon as possible for a visit in 1 week.   Specialty:  Internal Medicine   Contact information:     [address] [place] [address] [phone]      The results of significant diagnostics from this hospitalization (including imaging, microbiology, ancillary and laboratory) are listed below for reference.    Significant Diagnostic Studies: Dg Chest 2 View  01/13/2014   CLINICAL DATA:  Shortness of breath, asthma and emesis. Productive cough and cold symptoms.  EXAM: CHEST  2 VIEW  COMPARISON:  12/03/2013  FINDINGS: There is motion artifact on the lateral image. Lungs are adequately inflated as there is mild hazy density over the posterior base on the lateral film as cannot exclude  atelectasis or infection. There is mild cardiomegaly slightly worse. There are degenerative changes of the spine.  IMPRESSION: Mild opacification of the posterior lung bases on the lateral film as cannot exclude atelectasis or infection.  Cardiomegaly.   Electronically Signed   By: Marin Olp M.D.   On: 01/13/2014 10:29   US Abdomen Limited Ruq  01/13/2014   CLINICAL DATA:  Abdominal pain.  EXAM: US ABDOMEN LIMITED - RIGHT UPPER QUADRANT  COMPARISON:  CT 12/03/2013  FINDINGS: Gallbladder:  No gallstones or wall thickening visualized. No sonographic Murphy sign noted.  Common bile duct:  Diameter: 4.4 mm.  Liver:  Increased echogenicity compatible with hepatic steatosis. 2 cm region of decreased echogenicity adjacent the gallbladder fossa likely focal fatty sparing.  IMPRESSION: No acute hepatobiliary findings.   Electronically Signed   By: Marin Olp M.D.   On: 01/13/2014 10:23   Microbiology: Recent Results (from the past 240 hour(s))  CULTURE, BLOOD (ROUTINE X 2)     Status: None   Collection Time    01/13/14  3:10 PM      Result Value Ref Range Status   Specimen Description BLOOD RIGHT ARM   Final   Special Requests BOTTLES DRAWN AEROBIC AND ANAEROBIC 10CC   Final   Culture  Setup Time     Final   Value: 01/13/2014 18:50     Performed at [facility]   Culture     Final    Value:        BLOOD CULTURE RECEIVED NO GROWTH TO DATE CULTURE WILL BE HELD FOR 5 DAYS BEFORE ISSUING A FINAL NEGATIVE REPORT     Performed at [facility]   Report Status PENDING   Incomplete  CULTURE, BLOOD (ROUTINE X 2)     Status: None   Collection Time    01/13/14  3:29 PM      Result Value Ref Range Status   Specimen Description BLOOD LEFT HAND   Final   Special Requests BOTTLES DRAWN AEROBIC ONLY 5CC   Final   Culture  Setup Time     Final   Value: 01/13/2014 18:50     Performed at [facility]   Culture     Final   Value:        BLOOD CULTURE RECEIVED NO GROWTH TO DATE CULTURE WILL BE HELD FOR 5 DAYS BEFORE ISSUING A FINAL NEGATIVE REPORT     Performed at [facility]   Report Status PENDING   Incomplete    Labs: Basic Metabolic Panel:  Recent Labs Lab 01/13/14 0845 01/13/14 1525 01/14/14 0714  NA 138  --  137  K 4.4  --  4.5  CL 99  --  100  CO2 23  --  23  GLUCOSE 204*  --  219*  BUN 7  --  9  CREATININE 0.92 0.84 0.89  CALCIUM 9.1  --  8.7   Liver Function Tests:  Recent Labs Lab 01/13/14 0845  AST 52*  ALT 53*  ALKPHOS 78  BILITOT 0.4  PROT 7.2  ALBUMIN 3.7    Recent Labs Lab 01/13/14 0845  LIPASE 19   CBC:  Recent Labs Lab 01/13/14 0845 01/13/14 1525 01/14/14 0714  WBC 5.5 7.5 12.7*  NEUTROABS 2.9  --   --   HGB 13.8 13.8 13.0  HCT 42.7 42.5 40.4  MCV 85.6 84.3 83.6  PLT 168 174 178   Cardiac Enzymes:  Recent Labs Lab 01/13/14 0845  TROPONINI <0.30   BNP: BNP (last 3 results)  Recent Labs  07/07/13 1019 01/13/14 0845  PROBNP 9.6 13.3   CBG:  Recent Labs Lab 01/15/14 2010 01/15/14 2341 01/16/14 0403 01/16/14 0726 01/16/14 1156  GLUCAP 175* 166* 104* 99 168*   Signed:  GHERGHE, COSTIN  [facility] 01/16/2014, 4:55 PM

## 2014-01-16 NOTE — Progress Notes (Signed)
Patient will be going home by bus.  Patient received new cane before leaving.  Patient given a bus pass.  Discharge paperwork was explained and given to patient.

## 2014-01-16 NOTE — Progress Notes (Signed)
4 prong black cane found in patient's room after discharge.  Called patient's residence and spoke with her uncle, Mr. Ginnie Smart.  He stated that she had received a new cane upon discharge and that we could throw this one away.  Earleen Reaper RN-BC, [employer]

## 2014-01-19 LAB — CULTURE, BLOOD (ROUTINE X 2)
CULTURE: NO GROWTH
Culture: NO GROWTH

## 2014-01-28 ENCOUNTER — Encounter (HOSPITAL_COMMUNITY): Payer: Self-pay | Admitting: Emergency Medicine

## 2014-01-28 ENCOUNTER — Emergency Department (HOSPITAL_COMMUNITY)
Admission: EM | Admit: 2014-01-28 | Discharge: 2014-01-28 | Disposition: A | Discharge status: Home / Self Care | Payer: Medicaid Other | Attending: Emergency Medicine | Admitting: Emergency Medicine

## 2014-01-28 ENCOUNTER — Emergency Department (HOSPITAL_COMMUNITY): Payer: Medicaid Other

## 2014-01-28 DIAGNOSIS — R197 Diarrhea, unspecified: Secondary | ICD-10-CM | POA: Diagnosis not present

## 2014-01-28 DIAGNOSIS — I10 Essential (primary) hypertension: Secondary | ICD-10-CM | POA: Insufficient documentation

## 2014-01-28 DIAGNOSIS — R079 Chest pain, unspecified: Secondary | ICD-10-CM | POA: Insufficient documentation

## 2014-01-28 DIAGNOSIS — Z79899 Other long term (current) drug therapy: Secondary | ICD-10-CM | POA: Insufficient documentation

## 2014-01-28 DIAGNOSIS — R1011 Right upper quadrant pain: Secondary | ICD-10-CM | POA: Insufficient documentation

## 2014-01-28 DIAGNOSIS — R0602 Shortness of breath: Secondary | ICD-10-CM | POA: Insufficient documentation

## 2014-01-28 DIAGNOSIS — Z91040 Latex allergy status: Secondary | ICD-10-CM | POA: Insufficient documentation

## 2014-01-28 DIAGNOSIS — R1012 Left upper quadrant pain: Secondary | ICD-10-CM | POA: Diagnosis not present

## 2014-01-28 DIAGNOSIS — R1110 Vomiting, unspecified: Secondary | ICD-10-CM | POA: Insufficient documentation

## 2014-01-28 DIAGNOSIS — Z791 Long term (current) use of non-steroidal anti-inflammatories (NSAID): Secondary | ICD-10-CM | POA: Diagnosis not present

## 2014-01-28 DIAGNOSIS — Z792 Long term (current) use of antibiotics: Secondary | ICD-10-CM | POA: Insufficient documentation

## 2014-01-28 DIAGNOSIS — Z794 Long term (current) use of insulin: Secondary | ICD-10-CM | POA: Insufficient documentation

## 2014-01-28 DIAGNOSIS — Z8739 Personal history of other diseases of the musculoskeletal system and connective tissue: Secondary | ICD-10-CM | POA: Diagnosis not present

## 2014-01-28 DIAGNOSIS — R112 Nausea with vomiting, unspecified: Secondary | ICD-10-CM | POA: Diagnosis not present

## 2014-01-28 DIAGNOSIS — Z3202 Encounter for pregnancy test, result negative: Secondary | ICD-10-CM | POA: Diagnosis not present

## 2014-01-28 DIAGNOSIS — IMO0002 Reserved for concepts with insufficient information to code with codable children: Secondary | ICD-10-CM | POA: Diagnosis not present

## 2014-01-28 DIAGNOSIS — R1013 Epigastric pain: Secondary | ICD-10-CM | POA: Diagnosis not present

## 2014-01-28 DIAGNOSIS — R000 Tachycardia, unspecified: Secondary | ICD-10-CM | POA: Insufficient documentation

## 2014-01-28 DIAGNOSIS — I252 Old myocardial infarction: Secondary | ICD-10-CM | POA: Diagnosis not present

## 2014-01-28 DIAGNOSIS — R05 Cough: Secondary | ICD-10-CM | POA: Insufficient documentation

## 2014-01-28 DIAGNOSIS — E119 Type 2 diabetes mellitus without complications: Secondary | ICD-10-CM | POA: Insufficient documentation

## 2014-01-28 DIAGNOSIS — F319 Bipolar disorder, unspecified: Secondary | ICD-10-CM | POA: Insufficient documentation

## 2014-01-28 DIAGNOSIS — R059 Cough, unspecified: Secondary | ICD-10-CM | POA: Insufficient documentation

## 2014-01-28 DIAGNOSIS — J45909 Unspecified asthma, uncomplicated: Secondary | ICD-10-CM | POA: Diagnosis not present

## 2014-01-28 DIAGNOSIS — F411 Generalized anxiety disorder: Secondary | ICD-10-CM | POA: Insufficient documentation

## 2014-01-28 DIAGNOSIS — Z87891 Personal history of nicotine dependence: Secondary | ICD-10-CM | POA: Diagnosis not present

## 2014-01-28 LAB — URINALYSIS, ROUTINE W REFLEX MICROSCOPIC
Bilirubin Urine: NEGATIVE
Glucose, UA: 100 mg/dL — AB
HGB URINE DIPSTICK: NEGATIVE
Ketones, ur: NEGATIVE mg/dL
Leukocytes, UA: NEGATIVE
NITRITE: NEGATIVE
PROTEIN: 30 mg/dL — AB
SPECIFIC GRAVITY, URINE: 1.016 (ref 1.005–1.030)
UROBILINOGEN UA: 0.2 mg/dL (ref 0.0–1.0)
pH: 5 (ref 5.0–8.0)

## 2014-01-28 LAB — CBC WITH DIFFERENTIAL/PLATELET
BASOS ABS: 0 10*3/uL (ref 0.0–0.1)
BASOS PCT: 0 % (ref 0–1)
EOS ABS: 0.1 10*3/uL (ref 0.0–0.7)
EOS PCT: 1 % (ref 0–5)
HEMATOCRIT: 41.5 % (ref 36.0–46.0)
Hemoglobin: 13.4 g/dL (ref 12.0–15.0)
Lymphocytes Relative: 41 % (ref 12–46)
Lymphs Abs: 1.9 10*3/uL (ref 0.7–4.0)
MCH: 27.7 pg (ref 26.0–34.0)
MCHC: 32.3 g/dL (ref 30.0–36.0)
MCV: 85.9 fL (ref 78.0–100.0)
MONOS PCT: 7 % (ref 3–12)
Monocytes Absolute: 0.3 10*3/uL (ref 0.1–1.0)
NEUTROS ABS: 2.3 10*3/uL (ref 1.7–7.7)
Neutrophils Relative %: 51 % (ref 43–77)
Platelets: 187 10*3/uL (ref 150–400)
RBC: 4.83 MIL/uL (ref 3.87–5.11)
RDW: 13.7 % (ref 11.5–15.5)
WBC: 4.6 10*3/uL (ref 4.0–10.5)

## 2014-01-28 LAB — URINE MICROSCOPIC-ADD ON

## 2014-01-28 LAB — I-STAT TROPONIN, ED: Troponin i, poc: 0 ng/mL (ref 0.00–0.08)

## 2014-01-28 LAB — COMPREHENSIVE METABOLIC PANEL
ALBUMIN: 3.5 g/dL (ref 3.5–5.2)
ALT: 45 U/L — ABNORMAL HIGH (ref 0–35)
ANION GAP: 17 — AB (ref 5–15)
AST: 29 U/L (ref 0–37)
Alkaline Phosphatase: 84 U/L (ref 39–117)
BUN: 6 mg/dL (ref 6–23)
CHLORIDE: 102 meq/L (ref 96–112)
CO2: 20 mEq/L (ref 19–32)
CREATININE: 0.94 mg/dL (ref 0.50–1.10)
Calcium: 8.7 mg/dL (ref 8.4–10.5)
GFR calc Af Amer: 84 mL/min — ABNORMAL LOW (ref >90)
GFR calc non Af Amer: 72 mL/min — ABNORMAL LOW (ref >90)
Glucose, Bld: 239 mg/dL — ABNORMAL HIGH (ref 70–99)
Potassium: 4.2 mEq/L (ref 3.7–5.3)
Sodium: 139 mEq/L (ref 137–147)
Total Bilirubin: 0.4 mg/dL (ref 0.3–1.2)
Total Protein: 7.1 g/dL (ref 6.0–8.3)

## 2014-01-28 LAB — LIPASE, BLOOD: LIPASE: 24 U/L (ref 11–59)

## 2014-01-28 LAB — D-DIMER, QUANTITATIVE: D-Dimer, Quant: 0.35 ug/mL-FEU (ref 0.00–0.48)

## 2014-01-28 LAB — POC URINE PREG, ED: PREG TEST UR: NEGATIVE

## 2014-01-28 MED ORDER — ONDANSETRON 4 MG PO TBDP
8.0000 mg | ORAL_TABLET | Freq: Once | ORAL | Status: AC
  Administered 2014-01-28: 8 mg via ORAL
  Filled 2014-01-28: qty 2

## 2014-01-28 MED ORDER — ONDANSETRON HCL 4 MG/2ML IJ SOLN
4.0000 mg | Freq: Once | INTRAMUSCULAR | Status: AC
  Administered 2014-01-28: 4 mg via INTRAVENOUS
  Filled 2014-01-28: qty 2

## 2014-01-28 MED ORDER — IPRATROPIUM-ALBUTEROL 0.5-2.5 (3) MG/3ML IN SOLN
3.0000 mL | Freq: Once | RESPIRATORY_TRACT | Status: AC
  Administered 2014-01-28: 3 mL via RESPIRATORY_TRACT
  Filled 2014-01-28: qty 3

## 2014-01-28 MED ORDER — ONDANSETRON 8 MG PO TBDP: 8.0000 mg | ORAL_TABLET | Freq: Three times a day (TID) | ORAL | Status: DC | PRN

## 2014-01-28 MED ORDER — SODIUM CHLORIDE 0.9 % IV BOLUS (SEPSIS)
1000.0000 mL | Freq: Once | INTRAVENOUS | Status: AC
  Administered 2014-01-28: 1000 mL via INTRAVENOUS

## 2014-01-28 NOTE — ED Notes (Signed)
Dr. Goldston at the bedside. 

## 2014-01-28 NOTE — ED Provider Notes (Signed)
CSN: [id number]     Arrival date & time 01/28/14  0551 History   First MD Initiated Contact with Patient 01/28/14 [phone]     Chief Complaint  Patient presents with  . Emesis     (Consider location/radiation/quality/duration/timing/severity/associated sxs/prior Treatment) HPI 43 year old female presents with vomiting that started this morning. She states that she was getting on the bus to go to school started developing nausea and vomiting during the ride. She was dropped off in the ER due to this. The patient states she's vomited at least 6 times. She's had diarrhea since yesterday. Loose, watery stools, no blood. She's also had a cough for couple days and some shortness of breath but feels like she needs a breathing treatment. This is similar to when she was admitted last month for pneumonia. She denies a specific pain until her abdomen is palpated. She states she has recurrent epigastric pain that worked up in the past with ultrasounds showed no obvious etiology. She endorses mild left chest wall pain with inspiration since her last discharge.  Past Medical History  Diagnosis Date  . Diabetes mellitus   . Hypertension   . Myocardial infarction   . Asthma   . Hyperlipidemia   . Sleep apnea   . Depression   . PTSD (post-traumatic stress disorder)   . Pain in joint, ankle and foot 02/17/2013  . Anxiety   . Bipolar 1 disorder    Past Surgical History  Procedure Laterality Date  . Tubal ligation    . Midtarsal arthrodesis Right 07/17/12  . Remove implant deep Right 07/17/12  . Lapidus fusion Right 01/10/12  . Carpal tunnel release Left 2001    Left wrist  . Gastroc recession extremity Right 3//20/14    Right foot  . Hernia repair      1996   History reviewed. No pertinent family history. History  Substance Use Topics  . Smoking status: Former Smoker    Types: Cigarettes    Quit date: 07/03/2013  . Smokeless tobacco: Never Used  . Alcohol Use: No   OB History   Grav Para Term  Preterm Abortions TAB SAB Ect Mult Living                 Review of Systems  Constitutional: Negative for fever.  Respiratory: Positive for cough and shortness of breath.   Cardiovascular: Positive for chest pain. Negative for leg swelling.  Gastrointestinal: Positive for nausea, vomiting, abdominal pain and diarrhea.  Genitourinary: Negative for dysuria.  All other systems reviewed and are negative.     Allergies  Celery oil and Latex  Home Medications   Prior to Admission medications   Medication Sig Start Date End Date Taking? Authorizing Provider  albuterol (PROVENTIL HFA;VENTOLIN HFA) 108 (90 BASE) MCG/ACT inhaler Inhale 2 puffs into the lungs every 4 (four) hours as needed for wheezing or shortness of breath.    Historical Provider, MD  amLODipine (NORVASC) 10 MG tablet Take 10 mg by mouth daily.    Historical Provider, MD  atenolol (TENORMIN) 50 MG tablet Take 50 mg by mouth daily.    Historical Provider, MD  Canagliflozin (INVOKANA) 300 MG TABS Take 300 mg by mouth daily.    Historical Provider, MD  cyclobenzaprine (FLEXERIL) 10 MG tablet Take 10 mg by mouth 3 (three) times daily as needed for muscle spasms.     Historical Provider, MD  DULoxetine (CYMBALTA) 60 MG capsule Take 60 mg by mouth daily.    Historical Provider, MD  hydrOXYzine (ATARAX/VISTARIL) 50 MG tablet Take 50 mg by mouth 3 (three) times daily as needed for anxiety.     Historical Provider, MD  insulin NPH-regular Human (NOVOLIN 70/30) (70-30) 100 UNIT/ML injection Inject 30 Units into the skin 2 (two) times daily with a meal.    Historical Provider, MD  ketoprofen (ORUDIS) 75 MG capsule Take 75 mg by mouth 3 (three) times daily as needed for pain (pain).    Historical Provider, MD  levofloxacin (LEVAQUIN) 750 MG tablet Take 1 tablet (750 mg total) by mouth daily. 01/16/14   Costin Karlyne Greenspan, MD  lisinopril (PRINIVIL,ZESTRIL) 20 MG tablet Take 20 mg by mouth 2 (two) times daily.     Historical Provider, MD   LORazepam (ATIVAN) 1 MG tablet Take 1 tablet (1 mg total) by mouth 3 (three) times daily as needed for anxiety. 29/51/88   Delora Fuel, MD  omeprazole (PRILOSEC) 20 MG capsule Take 1 capsule (20 mg total) by mouth daily. 07/07/13   Carmin Muskrat, MD  orphenadrine (NORFLEX) 100 MG tablet Take 1 tablet (100 mg total) by mouth 2 (two) times daily as needed for muscle spasms (moderate pain). 12/03/13   Bonnita Hollow, MD  oxyCODONE-acetaminophen (PERCOCET) 10-325 MG per tablet Take 1 tablet by mouth every 8 (eight) hours as needed for pain. 10/23/13   Myeong Sheard, DPM  predniSONE (DELTASONE) 20 MG tablet Take 2 tablets (40 mg total) by mouth daily with breakfast. 2 tablets daily for 2 days then 1 tablet daily for 2 days then stop 01/16/14   Caren Griffins, MD  risperiDONE (RISPERDAL) 2 MG tablet Take 2 mg by mouth at bedtime.    Historical Provider, MD  sucralfate (CARAFATE) 1 G tablet Take 1 g by mouth daily.    Historical Provider, MD  tiotropium (SPIRIVA) 18 MCG inhalation capsule Place 18 mcg into inhaler and inhale daily.    Historical Provider, MD   BP 162/92  Pulse 114  Temp(Src) 98.4 F (36.9 C) (Oral)  Resp 22  Ht 5\' 6"  (1.676 m)  Wt 289 lb (131.09 kg)  BMI 46.67 kg/m2  SpO2 95%  LMP 12/14/2013 Physical Exam  Nursing note and vitals reviewed. Constitutional: She is oriented to person, place, and time. She appears well-developed and well-nourished. No distress.  Morbidly obese  HENT:  Head: Normocephalic and atraumatic.  Right Ear: External ear normal.  Left Ear: External ear normal.  Nose: Nose normal.  Eyes: Right eye exhibits no discharge. Left eye exhibits no discharge.  Cardiovascular: Regular rhythm and normal heart sounds.  Tachycardia present.   Pulmonary/Chest: Effort normal and breath sounds normal.  Scan expiratory wheezes  Abdominal: Soft. She exhibits no distension. There is tenderness in the right upper quadrant, epigastric area and left upper quadrant.   Neurological: She is alert and oriented to person, place, and time.  Skin: Skin is warm and dry.    ED Course  Procedures (including critical care time) Labs Review Labs Reviewed  COMPREHENSIVE METABOLIC PANEL - Abnormal; Notable for the following:    Glucose, Bld 239 (*)    ALT 45 (*)    GFR calc non Af Amer 72 (*)    GFR calc Af Amer 84 (*)    Anion gap 17 (*)    All other components within normal limits  URINALYSIS, ROUTINE W REFLEX MICROSCOPIC - Abnormal; Notable for the following:    APPearance HAZY (*)    Glucose, UA 100 (*)    Protein, ur 30 (*)  All other components within normal limits  URINE MICROSCOPIC-ADD ON - Abnormal; Notable for the following:    Squamous Epithelial / LPF FEW (*)    Bacteria, UA FEW (*)    Crystals URIC ACID CRYSTALS (*)    All other components within normal limits  CBC WITH DIFFERENTIAL  LIPASE, BLOOD  D-DIMER, QUANTITATIVE  POC URINE PREG, ED  I-STAT TROPOININ, ED    Imaging Review Dg Chest 2 View  01/28/2014   CLINICAL DATA:  Cough and congestion and chest pain with history of diabetes and asthma  EXAM: CHEST  2 VIEW  COMPARISON:  PA and lateral chest of January 13, 2014  FINDINGS: The lungs are adequately inflated. There is no focal infiltrate. The interstitial markings are coarse though stable. The cardiac silhouette is top-normal in size but stable. The pulmonary vascularity is normal. There is no pleural effusion. The trachea is midline. There is mild degenerative disc change at multiple thoracic levels.  IMPRESSION: There are chronically increased pulmonary interstitial markings likely related to asthma and previous tobacco use. There is no evidence of CHF nor pneumonia.   Electronically Signed   By: David  Martinique   On: 01/28/2014 07:54     EKG Interpretation   Date/Time:  Thursday January 28 2014 07:59:48 EDT Ventricular Rate:  99 PR Interval:  121 QRS Duration: 80 QT Interval:  364 QTC Calculation: 467 R Axis:   32 Text  Interpretation:  Normal sinus rhythm Normal ECG No significant change  since last tracing Confirmed by Myalee Stengel  MD, Tamari Redwine ([id number]) on 01/28/2014  8:16:28 AM      MDM   Final diagnoses:  Non-intractable vomiting with nausea, vomiting of unspecified type    Patient's vomiting is likely from a gastroenteritis given her associated diarrhea. Has recurrent upper abdominal pain of unclear etiology, most likely associated with her nausea. Even with a heart rate of 140 in triage is significantly decreased with fluids. Heart rate is now around high 90s and low 100s. Blood pressure is normal. Workup for her cough is unremarkable. At this time she feels significantly improved and prefers to go home. Will discharge with Zofran and recommend followup with her PCP. Discussed return precautions.    Ephraim Hamburger, MD 01/28/14 [phone]

## 2014-01-28 NOTE — ED Notes (Signed)
Patient reports she has been throwing up since 5am this morning, approximately 6 times vomiting.  She reports previously being hospitalized for the same thing, and was told she had PNA.

## 2014-01-28 NOTE — Discharge Instructions (Signed)
Nausea and Vomiting °Nausea is a sick feeling that often comes before throwing up (vomiting). Vomiting is a reflex where stomach contents come out of your mouth. Vomiting can cause severe loss of body fluids (dehydration). Children and elderly adults can become dehydrated quickly, especially if they also have diarrhea. Nausea and vomiting are symptoms of a condition or disease. It is important to find the cause of your symptoms. °CAUSES  °· Direct irritation of the stomach lining. This irritation can result from increased acid production (gastroesophageal reflux disease), infection, food poisoning, taking certain medicines (such as nonsteroidal anti-inflammatory drugs), alcohol use, or tobacco use. °· Signals from the brain. These signals could be caused by a headache, heat exposure, an inner ear disturbance, increased pressure in the brain from injury, infection, a tumor, or a concussion, pain, emotional stimulus, or metabolic problems. °· An obstruction in the gastrointestinal tract (bowel obstruction). °· Illnesses such as diabetes, hepatitis, gallbladder problems, appendicitis, kidney problems, cancer, sepsis, atypical symptoms of a heart attack, or eating disorders. °· Medical treatments such as chemotherapy and radiation. °· Receiving medicine that makes you sleep (general anesthetic) during surgery. °DIAGNOSIS °Your caregiver may ask for tests to be done if the problems do not improve after a few days. Tests may also be done if symptoms are severe or if the reason for the nausea and vomiting is not clear. Tests may include: °· Urine tests. °· Blood tests. °· Stool tests. °· Cultures (to look for evidence of infection). °· X-rays or other imaging studies. °Test results can help your caregiver make decisions about treatment or the need for additional tests. °TREATMENT °You need to stay well hydrated. Drink frequently but in small amounts. You may wish to drink water, sports drinks, clear broth, or eat frozen  ice pops or gelatin dessert to help stay hydrated. When you eat, eating slowly may help prevent nausea. There are also some antinausea medicines that may help prevent nausea. °HOME CARE INSTRUCTIONS  °· Take all medicine as directed by your caregiver. °· If you do not have an appetite, do not force yourself to eat. However, you must continue to drink fluids. °· If you have an appetite, eat a normal diet unless your caregiver tells you differently. °¨ Eat a variety of complex carbohydrates (rice, wheat, potatoes, bread), lean meats, yogurt, fruits, and vegetables. °¨ Avoid high-fat foods because they are more difficult to digest. °· Drink enough water and fluids to keep your urine clear or pale yellow. °· If you are dehydrated, ask your caregiver for specific rehydration instructions. Signs of dehydration may include: °¨ Severe thirst. °¨ Dry lips and mouth. °¨ Dizziness. °¨ Dark urine. °¨ Decreasing urine frequency and amount. °¨ Confusion. °¨ Rapid breathing or pulse. °SEEK IMMEDIATE MEDICAL CARE IF:  °· You have blood or brown flecks (like coffee grounds) in your vomit. °· You have black or bloody stools. °· You have a severe headache or stiff neck. °· You are confused. °· You have severe abdominal pain. °· You have chest pain or trouble breathing. °· You do not urinate at least once every 8 hours. °· You develop cold or clammy skin. °· You continue to vomit for longer than 24 to 48 hours. °· You have a fever. °MAKE SURE YOU:  °· Understand these instructions. °· Will watch your condition. °· Will get help right away if you are not doing well or get worse. °Document Released: 06/04/2005 Document Revised: 08/27/2011 Document Reviewed: 11/01/2010 °[employer]® Patient Information ©2015 [employer]. This information is not intended   to replace advice given to you by your health care provider. Make sure you discuss any questions you have with your health care provider.    Diarrhea Diarrhea is frequent loose and  watery bowel movements. It can cause you to feel weak and dehydrated. Dehydration can cause you to become tired and thirsty, have a dry mouth, and have decreased urination that often is dark yellow. Diarrhea is a sign of another problem, most often an infection that will not last long. In most cases, diarrhea typically lasts 2-3 days. However, it can last longer if it is a sign of something more serious. It is important to treat your diarrhea as directed by your caregiver to lessen or prevent future episodes of diarrhea. CAUSES  Some common causes include:  Gastrointestinal infections caused by viruses, bacteria, or parasites.  Food poisoning or food allergies.  Certain medicines, such as antibiotics, chemotherapy, and laxatives.  Artificial sweeteners and fructose.  Digestive disorders. HOME CARE INSTRUCTIONS  Ensure adequate fluid intake (hydration): Have 1 cup (8 oz) of fluid for each diarrhea episode. Avoid fluids that contain simple sugars or sports drinks, fruit juices, whole milk products, and sodas. Your urine should be clear or pale yellow if you are drinking enough fluids. Hydrate with an oral rehydration solution that you can purchase at pharmacies, retail stores, and online. You can prepare an oral rehydration solution at home by mixing the following ingredients together:   - tsp table salt.   tsp baking soda.   tsp salt substitute containing potassium chloride.  1  tablespoons sugar.  1 L (34 oz) of water.  Certain foods and beverages may increase the speed at which food moves through the gastrointestinal (GI) tract. These foods and beverages should be avoided and include:  Caffeinated and alcoholic beverages.  High-fiber foods, such as raw fruits and vegetables, nuts, seeds, and whole grain breads and cereals.  Foods and beverages sweetened with sugar alcohols, such as xylitol, sorbitol, and mannitol.  Some foods may be well tolerated and may help thicken stool  including:  Starchy foods, such as rice, toast, pasta, low-sugar cereal, oatmeal, grits, baked potatoes, crackers, and bagels.  Bananas.  Applesauce.  Add probiotic-rich foods to help increase healthy bacteria in the GI tract, such as yogurt and fermented milk products.  Wash your hands well after each diarrhea episode.  Only take over-the-counter or prescription medicines as directed by your caregiver.  Take a warm bath to relieve any burning or pain from frequent diarrhea episodes. SEEK IMMEDIATE MEDICAL CARE IF:   You are unable to keep fluids down.  You have persistent vomiting.  You have blood in your stool, or your stools are black and tarry.  You do not urinate in 6-8 hours, or there is only a small amount of very dark urine.  You have abdominal pain that increases or localizes.  You have weakness, dizziness, confusion, or light-headedness.  You have a severe headache.  Your diarrhea gets worse or does not get better.  You have a fever or persistent symptoms for more than 2-3 days.  You have a fever and your symptoms suddenly get worse. MAKE SURE YOU:   Understand these instructions.  Will watch your condition.  Will get help right away if you are not doing well or get worse. Document Released: 05/25/2002 Document Revised: 10/19/2013 Document Reviewed: 02/10/2012 [facility] Patient Information 2015 [place], [place]. This information is not intended to replace advice given to you by your health care provider. Make sure  you discuss any questions you have with your health care provider.   Dehydration, Adult Dehydration is when you lose more fluids from the body than you take in. Vital organs like the kidneys, brain, and heart cannot function without a proper amount of fluids and salt. Any loss of fluids from the body can cause dehydration.  CAUSES   Vomiting.  Diarrhea.  Excessive sweating.  Excessive urine output.  Fever. SYMPTOMS  Mild  dehydration  Thirst.  Dry lips.  Slightly dry mouth. Moderate dehydration  Very dry mouth.  Sunken eyes.  Skin does not bounce back quickly when lightly pinched and released.  Dark urine and decreased urine production.  Decreased tear production.  Headache. Severe dehydration  Very dry mouth.  Extreme thirst.  Rapid, weak pulse (more than 100 beats per minute at rest).  Cold hands and feet.  Not able to sweat in spite of heat and temperature.  Rapid breathing.  Blue lips.  Confusion and lethargy.  Difficulty being awakened.  Minimal urine production.  No tears. DIAGNOSIS  Your caregiver will diagnose dehydration based on your symptoms and your exam. Blood and urine tests will help confirm the diagnosis. The diagnostic evaluation should also identify the cause of dehydration. TREATMENT  Treatment of mild or moderate dehydration can often be done at home by increasing the amount of fluids that you drink. It is best to drink small amounts of fluid more often. Drinking too much at one time can make vomiting worse. Refer to the home care instructions below. Severe dehydration needs to be treated at the hospital where you will probably be given intravenous (IV) fluids that contain water and electrolytes. HOME CARE INSTRUCTIONS   Ask your caregiver about specific rehydration instructions.  Drink enough fluids to keep your urine clear or pale yellow.  Drink small amounts frequently if you have nausea and vomiting.  Eat as you normally do.  Avoid:  Foods or drinks high in sugar.  Carbonated drinks.  Juice.  Extremely hot or cold fluids.  Drinks with caffeine.  Fatty, greasy foods.  Alcohol.  Tobacco.  Overeating.  Gelatin desserts.  Wash your hands well to avoid spreading bacteria and viruses.  Only take over-the-counter or prescription medicines for pain, discomfort, or fever as directed by your caregiver.  Ask your caregiver if you should  continue all prescribed and over-the-counter medicines.  Keep all follow-up appointments with your caregiver. SEEK MEDICAL CARE IF:  You have abdominal pain and it increases or stays in one area (localizes).  You have a rash, stiff neck, or severe headache.  You are irritable, sleepy, or difficult to awaken.  You are weak, dizzy, or extremely thirsty. SEEK IMMEDIATE MEDICAL CARE IF:   You are unable to keep fluids down or you get worse despite treatment.  You have frequent episodes of vomiting or diarrhea.  You have blood or green matter (bile) in your vomit.  You have blood in your stool or your stool looks black and tarry.  You have not urinated in 6 to 8 hours, or you have only urinated a small amount of very dark urine.  You have a fever.  You faint. MAKE SURE YOU:   Understand these instructions.  Will watch your condition.  Will get help right away if you are not doing well or get worse. Document Released: 06/04/2005 Document Revised: 08/27/2011 Document Reviewed: 01/22/2011 [facility] Patient Information 2015 [place], [place]. This information is not intended to replace advice given to you by your health  care provider. Make sure you discuss any questions you have with your health care provider.

## 2014-02-17 ENCOUNTER — Encounter (HOSPITAL_COMMUNITY): Payer: Self-pay | Admitting: Emergency Medicine

## 2014-02-17 ENCOUNTER — Emergency Department (HOSPITAL_COMMUNITY): Payer: Medicaid Other

## 2014-02-17 ENCOUNTER — Inpatient Hospital Stay (HOSPITAL_COMMUNITY)
Admission: EM | Admit: 2014-02-17 | Discharge: 2014-02-19 | DRG: 202 | Disposition: A | Discharge status: Home / Self Care | Payer: Medicaid Other | Attending: Internal Medicine | Admitting: Internal Medicine

## 2014-02-17 DIAGNOSIS — H109 Unspecified conjunctivitis: Secondary | ICD-10-CM | POA: Diagnosis present

## 2014-02-17 DIAGNOSIS — E119 Type 2 diabetes mellitus without complications: Secondary | ICD-10-CM | POA: Diagnosis present

## 2014-02-17 DIAGNOSIS — Z6841 Body Mass Index (BMI) 40.0 and over, adult: Secondary | ICD-10-CM

## 2014-02-17 DIAGNOSIS — J9601 Acute respiratory failure with hypoxia: Secondary | ICD-10-CM

## 2014-02-17 DIAGNOSIS — Z79899 Other long term (current) drug therapy: Secondary | ICD-10-CM

## 2014-02-17 DIAGNOSIS — Z794 Long term (current) use of insulin: Secondary | ICD-10-CM

## 2014-02-17 DIAGNOSIS — J45901 Unspecified asthma with (acute) exacerbation: Secondary | ICD-10-CM | POA: Diagnosis present

## 2014-02-17 DIAGNOSIS — E785 Hyperlipidemia, unspecified: Secondary | ICD-10-CM | POA: Diagnosis present

## 2014-02-17 DIAGNOSIS — F4310 Post-traumatic stress disorder, unspecified: Secondary | ICD-10-CM | POA: Diagnosis present

## 2014-02-17 DIAGNOSIS — K219 Gastro-esophageal reflux disease without esophagitis: Secondary | ICD-10-CM | POA: Diagnosis present

## 2014-02-17 DIAGNOSIS — J9600 Acute respiratory failure, unspecified whether with hypoxia or hypercapnia: Secondary | ICD-10-CM | POA: Diagnosis present

## 2014-02-17 DIAGNOSIS — H5460 Unqualified visual loss, one eye, unspecified: Secondary | ICD-10-CM | POA: Diagnosis present

## 2014-02-17 DIAGNOSIS — I2510 Atherosclerotic heart disease of native coronary artery without angina pectoris: Secondary | ICD-10-CM | POA: Diagnosis present

## 2014-02-17 DIAGNOSIS — R0602 Shortness of breath: Secondary | ICD-10-CM | POA: Diagnosis present

## 2014-02-17 DIAGNOSIS — G4733 Obstructive sleep apnea (adult) (pediatric): Secondary | ICD-10-CM | POA: Diagnosis present

## 2014-02-17 DIAGNOSIS — M940 Chondrocostal junction syndrome [Tietze]: Secondary | ICD-10-CM | POA: Diagnosis present

## 2014-02-17 DIAGNOSIS — F339 Major depressive disorder, recurrent, unspecified: Secondary | ICD-10-CM | POA: Diagnosis present

## 2014-02-17 DIAGNOSIS — I10 Essential (primary) hypertension: Secondary | ICD-10-CM | POA: Diagnosis present

## 2014-02-17 DIAGNOSIS — F411 Generalized anxiety disorder: Secondary | ICD-10-CM | POA: Diagnosis present

## 2014-02-17 DIAGNOSIS — F061 Catatonic disorder due to known physiological condition: Secondary | ICD-10-CM | POA: Diagnosis present

## 2014-02-17 DIAGNOSIS — Z87891 Personal history of nicotine dependence: Secondary | ICD-10-CM

## 2014-02-17 DIAGNOSIS — H5462 Unqualified visual loss, left eye, normal vision right eye: Secondary | ICD-10-CM

## 2014-02-17 DIAGNOSIS — I252 Old myocardial infarction: Secondary | ICD-10-CM

## 2014-02-17 LAB — CBC WITH DIFFERENTIAL/PLATELET
Basophils Absolute: 0 10*3/uL (ref 0.0–0.1)
Basophils Relative: 0 % (ref 0–1)
Eosinophils Absolute: 0 10*3/uL (ref 0.0–0.7)
Eosinophils Relative: 0 % (ref 0–5)
HCT: 43.4 % (ref 36.0–46.0)
Hemoglobin: 14.3 g/dL (ref 12.0–15.0)
Lymphocytes Relative: 29 % (ref 12–46)
Lymphs Abs: 2 10*3/uL (ref 0.7–4.0)
MCH: 28.1 pg (ref 26.0–34.0)
MCHC: 32.9 g/dL (ref 30.0–36.0)
MCV: 85.3 fL (ref 78.0–100.0)
Monocytes Absolute: 0.5 10*3/uL (ref 0.1–1.0)
Monocytes Relative: 7 % (ref 3–12)
Neutro Abs: 4.5 10*3/uL (ref 1.7–7.7)
Neutrophils Relative %: 64 % (ref 43–77)
Platelets: 173 10*3/uL (ref 150–400)
RBC: 5.09 MIL/uL (ref 3.87–5.11)
RDW: 14.3 % (ref 11.5–15.5)
WBC: 7 10*3/uL (ref 4.0–10.5)

## 2014-02-17 LAB — CBC
HCT: 41.5 % (ref 36.0–46.0)
HEMOGLOBIN: 13.6 g/dL (ref 12.0–15.0)
MCH: 27.8 pg (ref 26.0–34.0)
MCHC: 32.8 g/dL (ref 30.0–36.0)
MCV: 84.7 fL (ref 78.0–100.0)
PLATELETS: 183 10*3/uL (ref 150–400)
RBC: 4.9 MIL/uL (ref 3.87–5.11)
RDW: 14.4 % (ref 11.5–15.5)
WBC: 8.3 10*3/uL (ref 4.0–10.5)

## 2014-02-17 LAB — COMPREHENSIVE METABOLIC PANEL
ALK PHOS: 80 U/L (ref 39–117)
ALT: 16 U/L (ref 0–35)
AST: 17 U/L (ref 0–37)
Albumin: 3.8 g/dL (ref 3.5–5.2)
Anion gap: 17 — ABNORMAL HIGH (ref 5–15)
BUN: 7 mg/dL (ref 6–23)
CALCIUM: 8.8 mg/dL (ref 8.4–10.5)
CHLORIDE: 100 meq/L (ref 96–112)
CO2: 21 mEq/L (ref 19–32)
Creatinine, Ser: 0.88 mg/dL (ref 0.50–1.10)
GFR calc Af Amer: >90 mL/min (ref >90)
GFR calc non Af Amer: 78 mL/min — ABNORMAL LOW (ref >90)
Glucose, Bld: 271 mg/dL — ABNORMAL HIGH (ref 70–99)
Potassium: 3.3 mEq/L — ABNORMAL LOW (ref 3.7–5.3)
SODIUM: 138 meq/L (ref 137–147)
Total Bilirubin: 0.4 mg/dL (ref 0.3–1.2)
Total Protein: 7.5 g/dL (ref 6.0–8.3)

## 2014-02-17 LAB — BASIC METABOLIC PANEL
Anion gap: 15 (ref 5–15)
BUN: 6 mg/dL (ref 6–23)
CO2: 23 mEq/L (ref 19–32)
Calcium: 9.2 mg/dL (ref 8.4–10.5)
Chloride: 101 mEq/L (ref 96–112)
Creatinine, Ser: 0.9 mg/dL (ref 0.50–1.10)
GFR calc Af Amer: 88 mL/min — ABNORMAL LOW (ref >90)
GFR calc non Af Amer: 76 mL/min — ABNORMAL LOW (ref >90)
Glucose, Bld: 156 mg/dL — ABNORMAL HIGH (ref 70–99)
Potassium: 3.7 mEq/L (ref 3.7–5.3)
Sodium: 139 mEq/L (ref 137–147)

## 2014-02-17 LAB — RAPID URINE DRUG SCREEN, HOSP PERFORMED
Amphetamines: NOT DETECTED
Barbiturates: NOT DETECTED
Benzodiazepines: NOT DETECTED
Cocaine: NOT DETECTED
OPIATES: NOT DETECTED
Tetrahydrocannabinol: NOT DETECTED

## 2014-02-17 LAB — D-DIMER, QUANTITATIVE (NOT AT ARMC): D-Dimer, Quant: 0.36 ug/mL-FEU (ref 0.00–0.48)

## 2014-02-17 LAB — GLUCOSE, CAPILLARY: GLUCOSE-CAPILLARY: 202 mg/dL — AB (ref 70–99)

## 2014-02-17 LAB — TROPONIN I

## 2014-02-17 MED ORDER — POLYMYXIN B-TRIMETHOPRIM 10000-0.1 UNIT/ML-% OP SOLN
1.0000 [drp] | OPHTHALMIC | Status: DC
  Administered 2014-02-17 – 2014-02-19 (×12): 1 [drp] via OPHTHALMIC
  Filled 2014-02-17: qty 10

## 2014-02-17 MED ORDER — DEXAMETHASONE SODIUM PHOSPHATE 10 MG/ML IJ SOLN
10.0000 mg | Freq: Once | INTRAMUSCULAR | Status: AC
  Administered 2014-02-17: 10 mg via INTRAVENOUS
  Filled 2014-02-17: qty 1

## 2014-02-17 MED ORDER — NITROGLYCERIN 0.4 MG SL SUBL
SUBLINGUAL_TABLET | SUBLINGUAL | Status: DC
Start: 2014-02-17 — End: 2014-02-17
  Filled 2014-02-17: qty 1

## 2014-02-17 MED ORDER — METHYLPREDNISOLONE SODIUM SUCC 125 MG IJ SOLR
125.0000 mg | Freq: Four times a day (QID) | INTRAMUSCULAR | Status: AC
  Administered 2014-02-17 – 2014-02-18 (×2): 125 mg via INTRAVENOUS
  Filled 2014-02-17 (×2): qty 2

## 2014-02-17 MED ORDER — HYDROXYZINE HCL 25 MG PO TABS: 50.0000 mg | ORAL_TABLET | Freq: Three times a day (TID) | ORAL | Status: DC | PRN

## 2014-02-17 MED ORDER — PANTOPRAZOLE SODIUM 40 MG PO TBEC
40.0000 mg | DELAYED_RELEASE_TABLET | Freq: Every day | ORAL | Status: DC
  Administered 2014-02-17 – 2014-02-19 (×3): 40 mg via ORAL
  Filled 2014-02-17 (×3): qty 1

## 2014-02-17 MED ORDER — CANAGLIFLOZIN 300 MG PO TABS
300.0000 mg | ORAL_TABLET | Freq: Every day | ORAL | Status: DC
  Administered 2014-02-17 – 2014-02-19 (×3): 300 mg via ORAL
  Filled 2014-02-17 (×3): qty 1

## 2014-02-17 MED ORDER — ALBUTEROL SULFATE (2.5 MG/3ML) 0.083% IN NEBU: 2.5000 mg | INHALATION_SOLUTION | RESPIRATORY_TRACT | Status: DC | PRN

## 2014-02-17 MED ORDER — GI COCKTAIL ~~LOC~~
30.0000 mL | Freq: Once | ORAL | Status: AC
  Administered 2014-02-17: 30 mL via ORAL
  Filled 2014-02-17: qty 30

## 2014-02-17 MED ORDER — KETOPROFEN 75 MG PO CAPS
75.0000 mg | ORAL_CAPSULE | Freq: Three times a day (TID) | ORAL | Status: DC | PRN
  Filled 2014-02-17: qty 1

## 2014-02-17 MED ORDER — ONDANSETRON 8 MG PO TBDP
8.0000 mg | ORAL_TABLET | Freq: Three times a day (TID) | ORAL | Status: DC | PRN
  Filled 2014-02-17: qty 1

## 2014-02-17 MED ORDER — IPRATROPIUM BROMIDE 0.02 % IN SOLN
0.5000 mg | Freq: Once | RESPIRATORY_TRACT | Status: AC
  Administered 2014-02-17: 0.5 mg via RESPIRATORY_TRACT
  Filled 2014-02-17: qty 2.5

## 2014-02-17 MED ORDER — ALBUTEROL (5 MG/ML) CONTINUOUS INHALATION SOLN
10.0000 mg/h | INHALATION_SOLUTION | RESPIRATORY_TRACT | Status: DC
  Administered 2014-02-17: 10 mg/h via RESPIRATORY_TRACT

## 2014-02-17 MED ORDER — INSULIN ASPART PROT & ASPART (70-30 MIX) 100 UNIT/ML ~~LOC~~ SUSP
35.0000 [IU] | Freq: Two times a day (BID) | SUBCUTANEOUS | Status: DC
Start: 2014-02-17 — End: 2014-02-19
  Administered 2014-02-17 – 2014-02-19 (×5): 35 [IU] via SUBCUTANEOUS
  Filled 2014-02-17: qty 10

## 2014-02-17 MED ORDER — MORPHINE SULFATE 2 MG/ML IJ SOLN: 2.0000 mg | INTRAMUSCULAR | Status: DC | PRN

## 2014-02-17 MED ORDER — LORAZEPAM 1 MG PO TABS: 1.0000 mg | ORAL_TABLET | Freq: Three times a day (TID) | ORAL | Status: DC | PRN

## 2014-02-17 MED ORDER — IBUPROFEN 400 MG PO TABS
400.0000 mg | ORAL_TABLET | Freq: Three times a day (TID) | ORAL | Status: DC
  Administered 2014-02-17 – 2014-02-19 (×6): 400 mg via ORAL
  Filled 2014-02-17 (×8): qty 1

## 2014-02-17 MED ORDER — RISPERIDONE 2 MG PO TABS
2.0000 mg | ORAL_TABLET | Freq: Every day | ORAL | Status: DC
  Administered 2014-02-17 – 2014-02-18 (×2): 2 mg via ORAL
  Filled 2014-02-17 (×3): qty 1

## 2014-02-17 MED ORDER — PREDNISONE 50 MG PO TABS
50.0000 mg | ORAL_TABLET | Freq: Every day | ORAL | Status: DC
  Administered 2014-02-18 – 2014-02-19 (×2): 50 mg via ORAL
  Filled 2014-02-17 (×2): qty 1

## 2014-02-17 MED ORDER — SUCRALFATE 1 G PO TABS
1.0000 g | ORAL_TABLET | Freq: Every day | ORAL | Status: DC
  Administered 2014-02-17 – 2014-02-19 (×3): 1 g via ORAL
  Filled 2014-02-17 (×3): qty 1

## 2014-02-17 MED ORDER — ALBUTEROL SULFATE (2.5 MG/3ML) 0.083% IN NEBU
2.5000 mg | INHALATION_SOLUTION | Freq: Three times a day (TID) | RESPIRATORY_TRACT | Status: DC
  Administered 2014-02-18 – 2014-02-19 (×4): 2.5 mg via RESPIRATORY_TRACT
  Filled 2014-02-17 (×5): qty 3

## 2014-02-17 MED ORDER — PREDNISONE 20 MG PO TABS
60.0000 mg | ORAL_TABLET | Freq: Once | ORAL | Status: AC
  Administered 2014-02-17: 60 mg via ORAL
  Filled 2014-02-17: qty 3

## 2014-02-17 MED ORDER — AMLODIPINE BESYLATE 10 MG PO TABS
10.0000 mg | ORAL_TABLET | Freq: Every day | ORAL | Status: DC
  Administered 2014-02-17 – 2014-02-19 (×3): 10 mg via ORAL
  Filled 2014-02-17 (×3): qty 1

## 2014-02-17 MED ORDER — ATENOLOL 50 MG PO TABS
50.0000 mg | ORAL_TABLET | Freq: Every day | ORAL | Status: DC
  Administered 2014-02-17 – 2014-02-19 (×3): 50 mg via ORAL
  Filled 2014-02-17 (×3): qty 1

## 2014-02-17 MED ORDER — SULFACETAMIDE SODIUM 10 % OP SOLN
2.0000 [drp] | Freq: Four times a day (QID) | OPHTHALMIC | Status: DC
  Filled 2014-02-17: qty 15

## 2014-02-17 MED ORDER — NITROGLYCERIN 0.4 MG SL SUBL
0.4000 mg | SUBLINGUAL_TABLET | SUBLINGUAL | Status: DC | PRN
  Administered 2014-02-17 – 2014-02-19 (×3): 0.4 mg via SUBLINGUAL
  Filled 2014-02-17 (×2): qty 1

## 2014-02-17 MED ORDER — ALBUTEROL SULFATE (2.5 MG/3ML) 0.083% IN NEBU
2.5000 mg | INHALATION_SOLUTION | Freq: Four times a day (QID) | RESPIRATORY_TRACT | Status: DC
  Administered 2014-02-17: 2.5 mg via RESPIRATORY_TRACT
  Filled 2014-02-17: qty 3

## 2014-02-17 MED ORDER — IBUPROFEN 400 MG PO TABS
400.0000 mg | ORAL_TABLET | Freq: Three times a day (TID) | ORAL | Status: DC
  Filled 2014-02-17: qty 1

## 2014-02-17 MED ORDER — DULOXETINE HCL 60 MG PO CPEP
60.0000 mg | ORAL_CAPSULE | Freq: Every day | ORAL | Status: DC
  Administered 2014-02-17 – 2014-02-19 (×3): 60 mg via ORAL
  Filled 2014-02-17 (×3): qty 1

## 2014-02-17 MED ORDER — HEPARIN SODIUM (PORCINE) 5000 UNIT/ML IJ SOLN
5000.0000 [IU] | Freq: Three times a day (TID) | INTRAMUSCULAR | Status: DC
  Administered 2014-02-17 – 2014-02-19 (×6): 5000 [IU] via SUBCUTANEOUS
  Filled 2014-02-17 (×8): qty 1

## 2014-02-17 MED ORDER — METHYLPREDNISOLONE SODIUM SUCC 125 MG IJ SOLR: 125.0000 mg | Freq: Four times a day (QID) | INTRAMUSCULAR | Status: DC

## 2014-02-17 MED ORDER — ASPIRIN 325 MG PO TABS
325.0000 mg | ORAL_TABLET | ORAL | Status: AC
  Administered 2014-02-17: 325 mg via ORAL
  Filled 2014-02-17: qty 1

## 2014-02-17 MED ORDER — CYCLOBENZAPRINE HCL 10 MG PO TABS: 10.0000 mg | ORAL_TABLET | Freq: Three times a day (TID) | ORAL | Status: DC | PRN

## 2014-02-17 MED ORDER — ORPHENADRINE CITRATE ER 100 MG PO TB12: 100.0000 mg | ORAL_TABLET | Freq: Two times a day (BID) | ORAL | Status: DC | PRN

## 2014-02-17 MED ORDER — ALBUTEROL (5 MG/ML) CONTINUOUS INHALATION SOLN
10.0000 mg/h | INHALATION_SOLUTION | RESPIRATORY_TRACT | Status: DC
  Administered 2014-02-17: 10 mg/h via RESPIRATORY_TRACT
  Filled 2014-02-17: qty 20

## 2014-02-17 MED ORDER — LISINOPRIL 20 MG PO TABS
20.0000 mg | ORAL_TABLET | Freq: Two times a day (BID) | ORAL | Status: DC
  Administered 2014-02-17 – 2014-02-19 (×4): 20 mg via ORAL
  Filled 2014-02-17 (×5): qty 1

## 2014-02-17 NOTE — H&P (Signed)
[facility] History and Physical  Diana Henderson HDQ:[id number] DOB: Mar 11, 1971 DOA: 02/17/2014  Referring physician: Gerald Stabs lawyer PCP: Benito Mccreedy, MD   Chief Complaint: SOB  HPI: Diana Henderson is a 43 y.o. female  With past medical history of asthma, and previous history of smoking that comes in for shortness of breath and started one week prior to admission she relates has progressively gotten worse. She went to her primary care doctor who treated her with a course of antibiotics and steroids which she took. She continues to be mixed growth to cigarette smoke. She denies any new animals no emotional no new allergens no new exposures at work or school. She relates she woke up this morning and she can walk to the bathroom without being short of breath. She she relates no fever some cough some nausea but no vomiting.  In the ED: She was given an albuterol treatment 1 hour, she was ambulated on room air and she decided to 90% from 100%. So we are consulted for further evaluation.   Review of Systems:  Constitutional:  No weight loss, night sweats, Fevers, chills, fatigue.  HEENT:  No headaches, Difficulty swallowing,Tooth/dental problems,Sore throat,  No sneezing, itching, ear ache, nasal congestion, post nasal drip,  Cardio-vascular:  No chest pain, Orthopnea, PND, swelling in lower extremities, anasarca, dizziness, palpitations  GI:  No heartburn, indigestion, abdominal pain, nausea, vomiting, diarrhea, change in bowel habits, loss of appetite  Skin:  no rash or lesions.  GU:  no dysuria, change in color of urine, no urgency or frequency. No flank pain.  Musculoskeletal:  No joint pain or swelling. No decreased range of motion. No back pain.  Psych:  No change in mood or affect. No depression or anxiety. No memory loss.   Past Medical History  Diagnosis Date  . Diabetes mellitus   . Hypertension   . Myocardial infarction   . Asthma   . Hyperlipidemia     . Sleep apnea   . Depression   . PTSD (post-traumatic stress disorder)   . Pain in joint, ankle and foot 02/17/2013  . Anxiety   . Bipolar 1 disorder    Past Surgical History  Procedure Laterality Date  . Tubal ligation    . Midtarsal arthrodesis Right 07/17/12  . Remove implant deep Right 07/17/12  . Lapidus fusion Right 01/10/12  . Carpal tunnel release Left 2001    Left wrist  . Gastroc recession extremity Right 3//20/14    Right foot  . Hernia repair      1996   Social History:  reports that she quit smoking about 7 months ago. Her smoking use included Cigarettes. She smoked 0.50 packs per day. She has never used smokeless tobacco. She reports that she does not drink alcohol or use illicit drugs.  Allergies  Allergen Reactions  . Celery Oil Hives  . Pork-Derived Products Nausea And Vomiting    headach  . Latex Itching    Powdered latex gloves.      Family History  Problem Relation Age of Onset  . Heart attack Mother   . Kidney disease Father      Prior to Admission medications   Medication Sig Start Date End Date Taking? Authorizing Provider  albuterol (PROVENTIL HFA;VENTOLIN HFA) 108 (90 BASE) MCG/ACT inhaler Inhale 2 puffs into the lungs every 4 (four) hours as needed for wheezing or shortness of breath.   Yes Historical Provider, MD  amLODipine (NORVASC) 10 MG tablet Take 10 mg by mouth daily.  Yes Historical Provider, MD  atenolol (TENORMIN) 50 MG tablet Take 50 mg by mouth daily.   Yes Historical Provider, MD  Canagliflozin (INVOKANA) 300 MG TABS Take 300 mg by mouth daily.   Yes Historical Provider, MD  cyclobenzaprine (FLEXERIL) 10 MG tablet Take 10 mg by mouth 3 (three) times daily as needed for muscle spasms.    Yes Historical Provider, MD  DULoxetine (CYMBALTA) 60 MG capsule Take 60 mg by mouth daily.   Yes Historical Provider, MD  hydrOXYzine (ATARAX/VISTARIL) 50 MG tablet Take 50 mg by mouth 3 (three) times daily as needed for anxiety.    Yes Historical  Provider, MD  insulin NPH-regular Human (NOVOLIN 70/30) (70-30) 100 UNIT/ML injection Inject 30 Units into the skin 2 (two) times daily with a meal.   Yes Historical Provider, MD  ketoprofen (ORUDIS) 75 MG capsule Take 75 mg by mouth 3 (three) times daily as needed for pain (pain).   Yes Historical Provider, MD  lisinopril (PRINIVIL,ZESTRIL) 20 MG tablet Take 20 mg by mouth 2 (two) times daily.    Yes Historical Provider, MD  LORazepam (ATIVAN) 1 MG tablet Take 1 mg by mouth 3 (three) times daily as needed for anxiety.   Yes Historical Provider, MD  omeprazole (PRILOSEC) 20 MG capsule Take 20 mg by mouth daily.   Yes Historical Provider, MD  ondansetron (ZOFRAN ODT) 8 MG disintegrating tablet Take 1 tablet (8 mg total) by mouth every 8 (eight) hours as needed for nausea or vomiting. 01/28/14  Yes Ephraim Hamburger, MD  orphenadrine (NORFLEX) 100 MG tablet Take 100 mg by mouth 2 (two) times daily as needed for muscle spasms.   Yes Historical Provider, MD  risperiDONE (RISPERDAL) 2 MG tablet Take 2 mg by mouth at bedtime.   Yes Historical Provider, MD  sucralfate (CARAFATE) 1 G tablet Take 1 g by mouth daily.   Yes Historical Provider, MD  tiotropium (SPIRIVA) 18 MCG inhalation capsule Place 18 mcg into inhaler and inhale daily.   Yes Historical Provider, MD   Physical Exam: Filed Vitals:   02/17/14 1545 02/17/14 1600 02/17/14 1615 02/17/14 1630  BP: 140/81 134/77 146/92 149/72  Pulse:  91 89 86  Temp:      TempSrc:      Resp: 24 23 17 19   Height:      Weight:      SpO2:  98% 100% 100%    Wt Readings from Last 3 Encounters:  02/17/14 131.543 kg (290 lb)  01/28/14 131.09 kg (289 lb)  01/15/14 136 kg (299 lb 13.2 oz)    General:  Appears calm and comfortable Eyes: Her left eye has purulent drainage ENT: grossly normal hearing, lips & tongue Neck: no LAD, masses or thyromegaly Cardiovascular: RRR, no m/r/g. No LE edema. Telemetry: SR, no arrhythmias  Respiratory: Moderate air movement,  with minimal wheezing, she did get IV steroids and albuterol after I saw her. She still appears to be using axillary muscles to breathe. Abdomen: soft, ntnd Skin: no rash or induration seen on limited exam Musculoskeletal: grossly normal tone BUE/BLE Psychiatric: grossly normal mood and affect, speech fluent and appropriate Neurologic: grossly non-focal.          Labs on Admission:  Basic Metabolic Panel:  Recent Labs Lab 02/17/14 1447  NA 139  K 3.7  CL 101  CO2 23  GLUCOSE 156*  BUN 6  CREATININE 0.90  CALCIUM 9.2   Liver Function Tests: No results found for this basename: AST,  ALT, ALKPHOS, BILITOT, PROT, ALBUMIN,  in the last 168 hours No results found for this basename: LIPASE, AMYLASE,  in the last 168 hours No results found for this basename: AMMONIA,  in the last 168 hours CBC:  Recent Labs Lab 02/17/14 1447  WBC 7.0  NEUTROABS 4.5  HGB 14.3  HCT 43.4  MCV 85.3  PLT 173   Cardiac Enzymes: No results found for this basename: CKTOTAL, CKMB, CKMBINDEX, TROPONINI,  in the last 168 hours  BNP (last 3 results)  Recent Labs  07/07/13 1019 01/13/14 0845  PROBNP 9.6 13.3   CBG: No results found for this basename: GLUCAP,  in the last 168 hours  Radiological Exams on Admission: Dg Chest 2 View  02/17/2014   CLINICAL DATA:  Cough and sore throat  EXAM: CHEST  2 VIEW  COMPARISON:  01/28/2014  FINDINGS: Cardiac shadow is stable. The lungs are well aerated bilaterally without focal infiltrate. No sizable effusion is seen. No acute bony abnormality is noted.  IMPRESSION: No active cardiopulmonary disease.   Electronically Signed   By: Inez Catalina M.D.   On: 02/17/2014 14:46    EKG: Independently reviewed. Sinus rhythm normal axis no T wave abnormalities.  Assessment/Plan Acute respiratory failure with hypoxia due to   Asthma exacerbation: - Bring her in for 23 hour observation, give her 2 additional doses of IV Solu-Medrol then change her to oral, she doesn't  have a leukocytosis or fevers. Her chest x-ray is clear. So I will not start her on empiric antibiotics. It to expect her white count to increase due to the steroids. - Continue schedule albuterol every 4 hours - No additional narcotics check a UDS no additional benzos.  Essential hypertension - Continue current home meds  Diabetes mellitus type 2, uncomplicated - Continue 2641 at higher dose with sliding scale insulin as she will be on steroids and her glucose are expected to increase.  Conjunctivitis: -  Start her on trimethoprim ophthalmic drops    Code Status: full DVT Prophylaxis:heparin Family Communication: none Disposition Plan: inpatient  Time spent: 23 minutes  Charlynne Cousins [facility] Pager [phone]

## 2014-02-17 NOTE — ED Provider Notes (Signed)
CSN: [id number]     Arrival date & time 02/17/14  1118 History   First MD Initiated Contact with Patient 02/17/14 1151     Chief Complaint  Patient presents with  . Shortness of Breath    The patient is here due to SOB, possible eye infection and cough.  The patient has asthma and says she has no air condition in her home and feels this is causing her to be sick.     (Consider location/radiation/quality/duration/timing/severity/associated sxs/prior Treatment) Patient is a 43 y.o. female presenting with shortness of breath. The history is provided by the patient.  Shortness of Breath Severity:  Severe Onset quality:  Gradual Duration:  1 week Timing:  Constant Progression:  Worsening Chronicity:  Recurrent Context: activity and URI   Context: not animal exposure, not emotional upset, not fumes, not known allergens, not occupational exposure, not pollens, not smoke exposure, not strong odors and not weather changes   Relieved by:  Nothing Worsened by:  Exertion Associated symptoms: cough and wheezing   Associated symptoms: no abdominal pain, no chest pain, no diaphoresis, no ear pain, no fever, no headaches, no hemoptysis, no neck pain, no PND, no rash, no sore throat, no sputum production, no syncope, no swollen glands and no vomiting     Past Medical History  Diagnosis Date  . Diabetes mellitus   . Hypertension   . Myocardial infarction   . Asthma   . Hyperlipidemia   . Sleep apnea   . Depression   . PTSD (post-traumatic stress disorder)   . Pain in joint, ankle and foot 02/17/2013  . Anxiety   . Bipolar 1 disorder    Past Surgical History  Procedure Laterality Date  . Tubal ligation    . Midtarsal arthrodesis Right 07/17/12  . Remove implant deep Right 07/17/12  . Lapidus fusion Right 01/10/12  . Carpal tunnel release Left 2001    Left wrist  . Gastroc recession extremity Right 3//20/14    Right foot  . Hernia repair      1996   History reviewed. No pertinent family  history. History  Substance Use Topics  . Smoking status: Former Smoker    Types: Cigarettes    Quit date: 07/03/2013  . Smokeless tobacco: Never Used  . Alcohol Use: No   OB History   Grav Para Term Preterm Abortions TAB SAB Ect Mult Living                 Review of Systems  Constitutional: Negative for fever and diaphoresis.  HENT: Negative for ear pain and sore throat.   Respiratory: Positive for cough, shortness of breath and wheezing. Negative for hemoptysis and sputum production.   Cardiovascular: Negative for chest pain, syncope and PND.  Gastrointestinal: Negative for vomiting and abdominal pain.  Endocrine: Negative for polyphagia and polyuria.  Genitourinary: Negative for dysuria.  Musculoskeletal: Negative for neck pain.  Skin: Negative for rash.  Neurological: Negative for syncope and headaches.    All other systems negative except as documented in the HPI. All pertinent positives and negatives as reviewed in the HPI.   Allergies  Celery oil; Pork-derived products; and Latex  Home Medications   Prior to Admission medications   Medication Sig Start Date End Date Taking? Authorizing Provider  albuterol (PROVENTIL HFA;VENTOLIN HFA) 108 (90 BASE) MCG/ACT inhaler Inhale 2 puffs into the lungs every 4 (four) hours as needed for wheezing or shortness of breath.   Yes Historical Provider, MD  amLODipine (  NORVASC) 10 MG tablet Take 10 mg by mouth daily.   Yes Historical Provider, MD  atenolol (TENORMIN) 50 MG tablet Take 50 mg by mouth daily.   Yes Historical Provider, MD  Canagliflozin (INVOKANA) 300 MG TABS Take 300 mg by mouth daily.   Yes Historical Provider, MD  cyclobenzaprine (FLEXERIL) 10 MG tablet Take 10 mg by mouth 3 (three) times daily as needed for muscle spasms.    Yes Historical Provider, MD  DULoxetine (CYMBALTA) 60 MG capsule Take 60 mg by mouth daily.   Yes Historical Provider, MD  hydrOXYzine (ATARAX/VISTARIL) 50 MG tablet Take 50 mg by mouth 3 (three)  times daily as needed for anxiety.    Yes Historical Provider, MD  insulin NPH-regular Human (NOVOLIN 70/30) (70-30) 100 UNIT/ML injection Inject 30 Units into the skin 2 (two) times daily with a meal.   Yes Historical Provider, MD  ketoprofen (ORUDIS) 75 MG capsule Take 75 mg by mouth 3 (three) times daily as needed for pain (pain).   Yes Historical Provider, MD  lisinopril (PRINIVIL,ZESTRIL) 20 MG tablet Take 20 mg by mouth 2 (two) times daily.    Yes Historical Provider, MD  LORazepam (ATIVAN) 1 MG tablet Take 1 mg by mouth 3 (three) times daily as needed for anxiety.   Yes Historical Provider, MD  omeprazole (PRILOSEC) 20 MG capsule Take 20 mg by mouth daily.   Yes Historical Provider, MD  ondansetron (ZOFRAN ODT) 8 MG disintegrating tablet Take 1 tablet (8 mg total) by mouth every 8 (eight) hours as needed for nausea or vomiting. 01/28/14  Yes Ephraim Hamburger, MD  orphenadrine (NORFLEX) 100 MG tablet Take 100 mg by mouth 2 (two) times daily as needed for muscle spasms.   Yes Historical Provider, MD  risperiDONE (RISPERDAL) 2 MG tablet Take 2 mg by mouth at bedtime.   Yes Historical Provider, MD  sucralfate (CARAFATE) 1 G tablet Take 1 g by mouth daily.   Yes Historical Provider, MD  tiotropium (SPIRIVA) 18 MCG inhalation capsule Place 18 mcg into inhaler and inhale daily.   Yes Historical Provider, MD   BP 156/86  Pulse 100  Temp(Src) 97.6 F (36.4 C) (Oral)  Resp 24  Ht 5\' 6"  (1.676 m)  Wt 290 lb (131.543 kg)  BMI 46.83 kg/m2  SpO2 100%  LMP 02/14/2014 Physical Exam  Nursing note and vitals reviewed. Constitutional: She is oriented to person, place, and time. She appears well-developed and well-nourished. No distress.  HENT:  Head: Normocephalic and atraumatic.  Eyes: Pupils are equal, round, and reactive to light.  Neck: Normal range of motion. Neck supple.  Cardiovascular: Normal rate, regular rhythm and normal heart sounds.  Exam reveals no gallop and no friction rub.   No  murmur heard. Pulmonary/Chest: She is in respiratory distress. She has wheezes.  Musculoskeletal: She exhibits no edema.  Neurological: She is alert and oriented to person, place, and time.  Skin: Skin is warm and dry. No rash noted. No erythema.  Psychiatric: She has a normal mood and affect. Her behavior is normal. Thought content normal.    ED Course  Procedures (including critical care time) Labs Review Labs Reviewed  BASIC METABOLIC PANEL - Abnormal; Notable for the following:    Glucose, Bld 156 (*)    GFR calc non Af Amer 76 (*)    GFR calc Af Amer 88 (*)    All other components within normal limits  CBC WITH DIFFERENTIAL    Imaging Review Dg Chest  2 View  02/17/2014   CLINICAL DATA:  Cough and sore throat  EXAM: CHEST  2 VIEW  COMPARISON:  01/28/2014  FINDINGS: Cardiac shadow is stable. The lungs are well aerated bilaterally without focal infiltrate. No sizable effusion is seen. No acute bony abnormality is noted.  IMPRESSION: No active cardiopulmonary disease.   Electronically Signed   By: Inez Catalina M.D.   On: 02/17/2014 14:46     EKG Interpretation   Date/Time:  Wednesday February 17 2014 11:26:20 EDT Ventricular Rate:  93 PR Interval:  134 QRS Duration: 66 QT Interval:  380 QTC Calculation: 472 R Axis:   50 Text Interpretation:  Normal sinus rhythm Normal ECG No significant change  since last tracing Confirmed by ZACKOWSKI  MD, SCOTT ([id number]) on 02/17/2014  11:37:25 AM      The patient had difficulty with ambulation and her Pulse Ox dropped to 93% and she had increased SOB. The patient stopped and stated that she could not continue walking. The patient will be admitted for further care.    Brent General, PA-C 02/17/14 1630

## 2014-02-17 NOTE — ED Notes (Signed)
The patient is here due to SOB, possible eye infection and cough.  The patient has asthma and says she has no air condition in her home and feels this is causing her to be sick.  The says she has a dry cough, has green/yellow discharge in her left eye and says she is SOB.  The patient is asthmatic and says she does not have air in her apartment.  She says it is hot in her apartment and when she comes out it is cold and she thinks this is causing her to get sick.

## 2014-02-17 NOTE — ED Notes (Signed)
2 iv therapist at pts bedside with [place] machine

## 2014-02-17 NOTE — ED Notes (Signed)
Per chris pa give decadron as ordered earlier now at this time

## 2014-02-17 NOTE — ED Provider Notes (Signed)
Medical screening examination/treatment/procedure(s) were performed by non-physician practitioner and as supervising physician I was immediately available for consultation/collaboration.   EKG Interpretation   Date/Time:  Wednesday February 17 2014 11:26:20 EDT Ventricular Rate:  93 PR Interval:  134 QRS Duration: 66 QT Interval:  380 QTC Calculation: 472 R Axis:   50 Text Interpretation:  Normal sinus rhythm Normal ECG No significant change  since last tracing Confirmed by Armond Cuthrell  MD, [place] ([id number]) on 02/17/2014  11:37:25 AM        Fredia Sorrow, MD 02/17/14 1635

## 2014-02-17 NOTE — Progress Notes (Signed)
Pt states she has no pain now. NP stated that pt could be taken off of O2 if pt didn't need it. Removed O2 due to pt stating she didn't need it. Will continue to monitor pt. Ranelle Oyster, RN

## 2014-02-17 NOTE — ED Notes (Signed)
Iv therapist unable to obtain line states she will have another iv therapist try for line chris pa informed of no iv access and decadron not given as of this time

## 2014-02-17 NOTE — Progress Notes (Signed)
Texted page Baltazar Najjar, NP that pain having headache rating of 8. Will continue to monitor pt. Ranelle Oyster, RN

## 2014-02-17 NOTE — Progress Notes (Signed)
Shift event: Pt admitted earlier today with asthma exacerbation. PMHx of MI, CAD, obesity, IDDM II, HTN, OSA, GERD, morbid obesity, PTSD, depression and anxiety.  RN, Sonia Baller, paged this NP secondary to pt c/o chest pain. CP located at left chest and radiating to "stomach and left shoulder" accompanied by a "little" SOB. No diaphoresis or n/v. Described as sharp and rated a 9/10. Onset sudden around 1955. BP 162/80 HR 97 reg.  O2sat 98% RA, Afebrile. 12 lead EKG neg for acute changes. NTG x 1 given . Ddimer .36. Troponin < .30. ASA 325mg  po given. O2 2L per [place] placed and removed after testing negative. Pain resolved quickly before MSO4 given. Will continue to monitor pt.  A./P: Atypical CP. Will try GI cocktail if recurs.  Clance Boll, NP [facility]

## 2014-02-17 NOTE — ED Notes (Signed)
Iv therapy called 

## 2014-02-17 NOTE — ED Notes (Signed)
Chris pa infoprmed iv therapists unable to obtain iv access with [place] machine

## 2014-02-17 NOTE — ED Notes (Signed)
To x-ray

## 2014-02-17 NOTE — Progress Notes (Signed)
Called ed for report.

## 2014-02-17 NOTE — ED Notes (Signed)
Attempted iv x 1 22g rt posterior hand without success cath intact dsad to site iv therapist called

## 2014-02-17 NOTE — ED Notes (Signed)
resp tjherapist called for hour long treatment

## 2014-02-17 NOTE — ED Notes (Addendum)
Ambulatory short distance in hallway oxygen sat to 94% with hr to  110/min pt states feels sob when walking

## 2014-02-17 NOTE — ED Notes (Signed)
RESP TREATMENT TO CONTINUE TO FLOOR FLOOR RN AWARE RESP THERAPIST AWARE

## 2014-02-17 NOTE — ED Notes (Signed)
resp therapist called for hour long treatment

## 2014-02-17 NOTE — Progress Notes (Signed)
Baltazar Najjar, NP put in order for scheduled Motrin. Will continue to monitor pt. Ranelle Oyster, RN

## 2014-02-17 NOTE — Progress Notes (Signed)
Paged Baltazar Najjar, NP at [phone] that pt was having sudden sharp chest pain rating a 9.  Baltazar Najjar, NP called back at 2000, gave orders for EKG, 2Lof O2, aspirin, troponins, d-dimer. Will continue to monitor. Ranelle Oyster, RN

## 2014-02-18 DIAGNOSIS — H5462 Unqualified visual loss, left eye, normal vision right eye: Secondary | ICD-10-CM

## 2014-02-18 DIAGNOSIS — H5460 Unqualified visual loss, one eye, unspecified: Secondary | ICD-10-CM

## 2014-02-18 DIAGNOSIS — J45901 Unspecified asthma with (acute) exacerbation: Principal | ICD-10-CM

## 2014-02-18 DIAGNOSIS — F339 Major depressive disorder, recurrent, unspecified: Secondary | ICD-10-CM

## 2014-02-18 DIAGNOSIS — H109 Unspecified conjunctivitis: Secondary | ICD-10-CM

## 2014-02-18 DIAGNOSIS — J9600 Acute respiratory failure, unspecified whether with hypoxia or hypercapnia: Secondary | ICD-10-CM

## 2014-02-18 DIAGNOSIS — I10 Essential (primary) hypertension: Secondary | ICD-10-CM

## 2014-02-18 LAB — COMPREHENSIVE METABOLIC PANEL
ALK PHOS: 72 U/L (ref 39–117)
ALT: 14 U/L (ref 0–35)
ANION GAP: 17 — AB (ref 5–15)
AST: 18 U/L (ref 0–37)
Albumin: 3.5 g/dL (ref 3.5–5.2)
BUN: 12 mg/dL (ref 6–23)
CHLORIDE: 100 meq/L (ref 96–112)
CO2: 22 mEq/L (ref 19–32)
CREATININE: 1.01 mg/dL (ref 0.50–1.10)
Calcium: 9.1 mg/dL (ref 8.4–10.5)
GFR calc Af Amer: 77 mL/min — ABNORMAL LOW (ref >90)
GFR calc non Af Amer: 66 mL/min — ABNORMAL LOW (ref >90)
GLUCOSE: 191 mg/dL — AB (ref 70–99)
POTASSIUM: 4.2 meq/L (ref 3.7–5.3)
Sodium: 139 mEq/L (ref 137–147)
Total Bilirubin: 0.3 mg/dL (ref 0.3–1.2)
Total Protein: 7.4 g/dL (ref 6.0–8.3)

## 2014-02-18 LAB — TROPONIN I: Troponin I: <0.3 ng/mL (ref <0.30)

## 2014-02-18 NOTE — Progress Notes (Signed)
Pt seen and examined. Agree with assessment and plan as outlined above. Pt presents initially with asthma exacerbation. 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 Appreciate Neurology input, who did not feel sx are neuro related. Instead, advised formal Ophthalmology consult to r/o glaucoma. Have consulted Opthalmology. Otherwise, pt is continued on abx eye drop for suspected conjunctivitis. Will follow.

## 2014-02-18 NOTE — Consult Note (Signed)
NEURO HOSPITALIST CONSULT NOTE    Reason for Consult: left eye loss of vision for one week.   HPI:                                                                                                                                          Diana Henderson is an 43 y.o. female who was admitted for SOB which started one week ago.  While in hospital she mentioned she had also "lost vision in her left eye one week ago".  She states "it all started when her eye would become matted from crusty stuff around the lid and then would ooz pus" she states she lost complete vision at that time. She did not seek medical attention "due to nothing else being wrong".  Currently she states she still has no vision in her left eye and has a slight HA bilateral frontal region.  She continues to state she has great vision in her right eye. She denies ocular pain, abnormal shapes or lights. She denies scalp tenderness, claudication when chewing, pain over her temples to palpation.   Past Medical History  Diagnosis Date  . Diabetes mellitus   . Hypertension   . Myocardial infarction   . Asthma   . Hyperlipidemia   . Sleep apnea   . Depression   . PTSD (post-traumatic stress disorder)   . Pain in joint, ankle and foot 02/17/2013  . Anxiety   . Bipolar 1 disorder     Past Surgical History  Procedure Laterality Date  . Tubal ligation    . Midtarsal arthrodesis Right 07/17/12  . Remove implant deep Right 07/17/12  . Lapidus fusion Right 01/10/12  . Carpal tunnel release Left 2001    Left wrist  . Gastroc recession extremity Right 3//20/14    Right foot  . Hernia repair      1996    Family History  Problem Relation Age of Onset  . Heart attack Mother   . Kidney disease Father     Social History:  reports that she quit smoking about 7 months ago. Her smoking use included Cigarettes. She smoked 0.50 packs per day. She has never used smokeless tobacco. She reports that she does not drink  alcohol or use illicit drugs.  Allergies  Allergen Reactions  . Celery Oil Hives  . Pork-Derived Products Nausea And Vomiting    headach  . Latex Itching    Powdered latex gloves.      MEDICATIONS:  Scheduled: . albuterol  2.5 mg Nebulization TID  . amLODipine  10 mg Oral Daily  . atenolol  50 mg Oral Daily  . Canagliflozin  300 mg Oral Daily  . DULoxetine  60 mg Oral Daily  . heparin  5,000 Units Subcutaneous 3 times per day  . ibuprofen  400 mg Oral TID  . insulin aspart protamine- aspart  35 Units Subcutaneous BID WC  . lisinopril  20 mg Oral BID  . pantoprazole  40 mg Oral Daily  . predniSONE  50 mg Oral Q supper  . risperiDONE  2 mg Oral QHS  . sucralfate  1 g Oral Daily  . trimethoprim-polymyxin b  1 drop Left Eye 6 times per day     ROS:                                                                                                                                       History obtained from the patient  General ROS: negative for - chills, fatigue, fever, night sweats, weight gain or weight loss Psychological ROS: negative for - behavioral disorder, hallucinations, memory difficulties, mood swings or suicidal ideation Ophthalmic ROS: negative for - blurry vision, double vision, eye pain or loss of vision ENT ROS: negative for - epistaxis, nasal discharge, oral lesions, sore throat, tinnitus or vertigo Allergy and Immunology ROS: negative for - hives or itchy/watery eyes Hematological and Lymphatic ROS: negative for - bleeding problems, bruising or swollen lymph nodes Endocrine ROS: negative for - galactorrhea, hair pattern changes, polydipsia/polyuria or temperature intolerance Respiratory ROS: negative for - cough, hemoptysis, shortness of breath or wheezing Cardiovascular ROS: negative for - chest pain, dyspnea on exertion, edema or irregular  heartbeat Gastrointestinal ROS: negative for - abdominal pain, diarrhea, hematemesis, nausea/vomiting or stool incontinence Genito-Urinary ROS: negative for - dysuria, hematuria, incontinence or urinary frequency/urgency Musculoskeletal ROS: negative for - joint swelling or muscular weakness Neurological ROS: as noted in HPI Dermatological ROS: negative for rash and skin lesion changes   Blood pressure 121/74, pulse 71, temperature 97.5 F (36.4 C), temperature source Oral, resp. rate 20, height 5\' 6"  (1.676 m), weight 135.6 kg (298 lb 15.1 oz), last menstrual period 02/14/2014, SpO2 98.00%.   Neurologic Examination:                                                                                                      General: NAD Mental Status: Alert, oriented, thought content appropriate.  Speech fluent without evidence of  aphasia.  Able to follow 3 step commands without difficulty. Cranial Nerves: II: Discs flat bilaterally; Visual fields on the right eye grossly normal--left eye blinks to threat in all fields. (of note--when covering her left eye and testing right eye she initially stated she could see nothing but when explained this was her good eye, she was bale to count fingers. When covering the right eye and testing the left eye, she would tract my finger when distracted and blinked to threat in all fields. pupils equal, round, reactive to light and accommodation III,IV, VI: ptosis not present, extra-ocular motions intact bilaterally V,VII: smile symmetric, facial light touch sensation normal bilaterally VIII: hearing normal bilaterally IX,X: gag reflex present XI: bilateral shoulder shrug XII: midline tongue extension without atrophy or fasciculations --no pain to palpation of temples Motor: Right : Upper extremity   5/5    Left:     Upper extremity   5/5  Lower extremity   5/5     Lower extremity   5/5 Tone and bulk:normal tone throughout; no atrophy noted Sensory: Pinprick and  light touch intact throughout, bilaterally Deep Tendon Reflexes:  Right: Upper Extremity   Left: Upper extremity   biceps (C-5 to C-6) 1/4   biceps (C-5 to C-6) 1/4 tricep (C7) 1/4    triceps (C7) 1/4 Brachioradialis (C6) 1/4  Brachioradialis (C6) 1/4  Lower Extremity Lower Extremity  quadriceps (L-2 to L-4) 0/4   quadriceps (L-2 to L-4) 0/4 Achilles (S1) 0/4   Achilles (S1) 0/4  Plantars: Mute bilaterally Cerebellar: normal finger-to-nose,  normal heel-to-shin test Gait: not tested CV: pulses palpable throughout    Lab Results: Basic Metabolic Panel:  Recent Labs Lab 02/17/14 1447 02/17/14 1837  NA 139 138  K 3.7 3.3*  CL 101 100  CO2 23 21  GLUCOSE 156* 271*  BUN 6 7  CREATININE 0.90 0.88  CALCIUM 9.2 8.8    Liver Function Tests:  Recent Labs Lab 02/17/14 1837  AST 17  ALT 16  ALKPHOS 80  BILITOT 0.4  PROT 7.5  ALBUMIN 3.8   No results found for this basename: LIPASE, AMYLASE,  in the last 168 hours No results found for this basename: AMMONIA,  in the last 168 hours  CBC:  Recent Labs Lab 02/17/14 1447 02/17/14 1837  WBC 7.0 8.3  NEUTROABS 4.5  --   HGB 14.3 13.6  HCT 43.4 41.5  MCV 85.3 84.7  PLT 173 183    Cardiac Enzymes:  Recent Labs Lab 02/17/14 2034 02/18/14 0122 02/18/14 1050  TROPONINI <0.30 <0.30 <0.30    Lipid Panel: No results found for this basename: CHOL, TRIG, HDL, CHOLHDL, VLDL, LDLCALC,  in the last 168 hours  CBG:  Recent Labs Lab 02/17/14 [place] 202*    Microbiology: Results for orders placed during the hospital encounter of 01/13/14  CULTURE, BLOOD (ROUTINE X 2)     Status: None   Collection Time    01/13/14  3:10 PM      Result Value Ref Range Status   Specimen Description BLOOD RIGHT ARM   Final   Special Requests BOTTLES DRAWN AEROBIC AND ANAEROBIC 10CC   Final   Culture  Setup Time     Final   Value: 01/13/2014 18:50     Performed at [facility]     Final   Value: NO  GROWTH 5 DAYS     Performed at [facility]   Report Status 01/19/2014 FINAL  Final  CULTURE, BLOOD (ROUTINE X 2)     Status: None   Collection Time    01/13/14  3:29 PM      Result Value Ref Range Status   Specimen Description BLOOD LEFT HAND   Final   Special Requests BOTTLES DRAWN AEROBIC ONLY 5CC   Final   Culture  Setup Time     Final   Value: 01/13/2014 18:50     Performed at [facility]   Culture     Final   Value: NO GROWTH 5 DAYS     Performed at [facility]   Report Status 01/19/2014 FINAL   Final    Coagulation Studies: No results found for this basename: LABPROT, INR,  in the last 72 hours  Imaging: Dg Chest 2 View  02/17/2014   CLINICAL DATA:  Cough and sore throat  EXAM: CHEST  2 VIEW  COMPARISON:  01/28/2014  FINDINGS: Cardiac shadow is stable. The lungs are well aerated bilaterally without focal infiltrate. No sizable effusion is seen. No acute bony abnormality is noted.  IMPRESSION: No active cardiopulmonary disease.   Electronically Signed   By: Inez Catalina M.D.   On: 02/17/2014 14:46       Assessment and plan per attending neurologist  Etta Quill PA-C [facility] [phone]  02/18/2014, 12:29 PM   Assessment/Plan: 43 year old female with subjective vision loss on the left. No APD to suggest optic neuritis. She denied vision, but tracked with her left eye only. The description of discharge and lack of clear findings would argue against a neurological cause of her vision loss. I would favor ophthalmology consultation to rule out angle closure glaucoma.  1) ophthalmology consultation 2) neurology will sign off. Please call for any further questions

## 2014-02-18 NOTE — Progress Notes (Signed)
Utilization review completed.  

## 2014-02-18 NOTE — Plan of Care (Cosign Needed)
Problem: Food- and Nutrition-Related Knowledge Deficit (NB-1.1) Goal: Nutrition education Formal process to instruct or train a patient/client in a skill or to impart knowledge to help patients/clients voluntarily manage or modify food choices and eating behavior to maintain or improve health.  Outcome: Completed/Met Date Met:  02/18/14  RD requested for nutrition education regarding diabetes per patient.    Lab Results  Component Value Date    HGBA1C 7.8* 01/13/2014    RD provided "Carbohydrate Counting for People with Diabetes" and "Using Nutrition Labels: Carbohydrates" handout from the Academy of Nutrition and Dietetics. Discussed different food groups and their effects on blood sugar, emphasizing carbohydrate-containing foods. Provided list of carbohydrates and recommended serving sizes of common foods.  Discussed importance of controlled and consistent carbohydrate intake throughout the day. Provided examples of ways to balance meals/snacks and encouraged intake of high-fiber, whole grain complex carbohydrates. Teach back method used.  Expect poor to fair compliance. Pt was very distracted by family during visit.  Pt requested dietitian come and review diabetes diet education. Discussed carbohydrates and carbohydrate counting, sugar-free beverages and label reading. Patient's appetite and PO intake is adequate.  Pt identified as at nutrition risk on the Malnutrition Screen Tool. Pt reports 8 lb weight loss, wt history shows no weight loss, UBW of 295 lb. Pt states that she would like to lose weight but isn't able to do much physical activity.  Body mass index is 48.27 kg/(m^2). Pt meets criteria for morbid obesity based on current BMI.  Current diet order is carb modified, patient is consuming approximately 100% of meals at this time. Labs and medications reviewed. No further nutrition interventions warranted at this time. RD contact information provided. If additional nutrition issues  arise, please re-consult RD.  Diana Henderson, Diana Henderson, [place] Licensed Dietitian Nutritionist Pager: [phone]

## 2014-02-18 NOTE — Progress Notes (Signed)
[facility] PROGRESS NOTE  Shelton Soler VOJ:[id number] DOB: Dec 24, 1970 DOA: 02/17/2014 PCP: Benito Mccreedy, MD  Assessment/Plan:  Acute Asthma Exacerbation -stable- lung clear bilaterally without any wheeze.  Afebrile, no leukocytosis- denies any cough.  -placed on IV steroids and transitioned to oral prednisone.     -CXR without abnormalities -currently on oral prednisone and albuterol neb -PRN albuterol inhaler  Ashthma, uncontolled  -exposed to second hand smoke at home and only on albuterol inhaler- educated patient that this is the cause of her attacks -continue oral steroids, albuterol nebulizer, and PRN inhaler  Hypertension -stable -continue lisinopril, amlodipine, atenolol  MDD, with catatonic features -continue home regimen of cymbalta and risperidone  Diabetes -CBG stable -continue Insulin 70/30 and oral canagliflozin   Chest pain -patient had substernal chest pain- resolved with SL nitroglycerin -EKG normal -Continue to monitor- nitroglycerin PRN  Conjunctivitis -decrease in left eye redness and discharge -continue polytrim eye drops   Left eye pain/inability to see -complains of moderate eye pain for past few months; and inability to see for the past month.   -Appreciate neurology consult- does not think this is neurological in origin, and advised to rule out glaucoma -Obtained opthalmology consult- to rule out glaucome  Sleep apnea -continue home CPAP  GERD -continue home regimen of sucralafate and pantoperazole  DVT Prophylaxis;  Heparin [place] Code Status: Full Family Communication: No family at bedside Disposition Plan: Home when stable   Consultants:  Neurology   Procedures: None  Antibiotics:  Polytrim ( day 2)  HPI  Diana Henderson is a 43 y.o. female with PMH of asthma, diabetes, hypertension, and depression who presented with shortness of breath for the past few days that progressively gotten worse. She has been  recently sick, and went to her PCP who treated her with antibiotics and steroids, without much improvement on the SOB.  She has associated nausea. She states she quit smoking 7 months ago, but is exposed to second hand smoking. She denied fever, cough, chest pain, abdominal pain, or any urinary complaints. In the Ed, she was given an albuterol treatment 1 hour, and when ambulated in room air, she desats to 90% from 100%. She is admitted, given IV steroids and transitioned to oral steroids.    Subjective:  Denies SOB;  headache resolved;   substernal chest pain resolved with SL nitroglycerin; complains of pain and inability to see out of left eye for the past week.   Objective: Filed Vitals:   02/18/14 0535  BP: 121/74  Pulse: 71  Temp: 97.5 F (36.4 C)  Resp: 20   No intake or output data in the 24 hours ending 02/18/14 0846 Filed Weights   02/17/14 1126 02/17/14 1720  Weight: 131.543 kg (290 lb) 135.6 kg (298 lb 15.1 oz)    Exam:  Gen: Alert and oriented obese AA female, some mild respiratory distress when speaking. On [place] HEENT: Normocephalic, atraumatic.  Pupils symmertrical.  Moist mucosa.   Chest: clear to auscultate bilaterally, no ronchi or rales  Cardiac: Regular rate and rhythm, S1-S2, no rubs murmurs or gallops  Abdomen: soft, non tender, non distended, +bowel sounds. No guarding or rigidity  Extremities: Symmetrical in appearance without cyanosis or edema.  Right foot with surgical scar  Neurological: Alert awake oriented to time place and person.  Psychiatric: Appears normal.   Data Reviewed: Basic Metabolic Panel:  Recent Labs Lab 02/17/14 1447 02/17/14 1837  NA 139 138  K 3.7 3.3*  CL 101 100  CO2 23 21  GLUCOSE 156* 271*  BUN 6 7  CREATININE 0.90 0.88  CALCIUM 9.2 8.8   Liver Function Tests:  Recent Labs Lab 02/17/14 1837  AST 17  ALT 16  ALKPHOS 80  BILITOT 0.4  PROT 7.5  ALBUMIN 3.8   No results found for this basename: LIPASE, AMYLASE,  in  the last 168 hours No results found for this basename: AMMONIA,  in the last 168 hours CBC:  Recent Labs Lab 02/17/14 1447 02/17/14 1837  WBC 7.0 8.3  NEUTROABS 4.5  --   HGB 14.3 13.6  HCT 43.4 41.5  MCV 85.3 84.7  PLT 173 183   Cardiac Enzymes:  Recent Labs Lab 02/17/14 2034 02/18/14 0122  TROPONINI <0.30 <0.30   BNP (last 3 results)  Recent Labs  07/07/13 1019 01/13/14 0845  PROBNP 9.6 13.3   CBG:  Recent Labs Lab 02/17/14 1732  GLUCAP 202*    No results found for this or any previous visit (from the past 240 hour(s)).   Studies: Dg Chest 2 View  02/17/2014   CLINICAL DATA:  Cough and sore throat  EXAM: CHEST  2 VIEW  COMPARISON:  01/28/2014  FINDINGS: Cardiac shadow is stable. The lungs are well aerated bilaterally without focal infiltrate. No sizable effusion is seen. No acute bony abnormality is noted.  IMPRESSION: No active cardiopulmonary disease.   Electronically Signed   By: Inez Catalina M.D.   On: 02/17/2014 14:46    Scheduled Meds: . albuterol  2.5 mg Nebulization TID  . amLODipine  10 mg Oral Daily  . atenolol  50 mg Oral Daily  . Canagliflozin  300 mg Oral Daily  . DULoxetine  60 mg Oral Daily  . heparin  5,000 Units Subcutaneous 3 times per day  . ibuprofen  400 mg Oral TID  . insulin aspart protamine- aspart  35 Units Subcutaneous BID WC  . lisinopril  20 mg Oral BID  . pantoprazole  40 mg Oral Daily  . predniSONE  50 mg Oral Q supper  . risperiDONE  2 mg Oral QHS  . sucralfate  1 g Oral Daily  . trimethoprim-polymyxin b  1 drop Left Eye 6 times per day   Continuous Infusions:   Principal Problem:   Acute respiratory failure with hypoxia Active Problems:   Major depressive disorder, recurrent, with catatonic features   Asthma exacerbation   Essential hypertension   Diabetes mellitus type 2, uncomplicated   Conjunctivitis    Time spent: [place], [facility]  [facility] Pager [phone]. If 7PM-7AM, please contact  night-coverage at [email or web address], password [facility] 02/18/2014, 8:46 AM  LOS: 1 day

## 2014-02-18 NOTE — Consult Note (Signed)
CC: Vision loss HPI: Patient was admitted for asthma exacerbation and c/o vision loss in the left eye. Neurology has seen the patient and felt it is not neurologic in origin. I was asked to consult. Patient reports she has had poor, very blurry but not dark vision in the left eye for several months. She says the right eye sees well. She says she had an eye exam with an eye doctor in [place] this year, and was told that her left eye was not doing as well as her right. She does not remember the name of her doctor. She also complains of AM discharge OS. She wears glasses but does not have them with her. Patient endorses uncontrolled blood glucose at home, with readings above 300.  POH: Denies surgery, laser or injury.   Past Medical History  Diagnosis Date  . Diabetes mellitus   . Hypertension   . Myocardial infarction   . Asthma   . Hyperlipidemia   . Sleep apnea   . Depression   . PTSD (post-traumatic stress disorder)   . Pain in joint, ankle and foot 02/17/2013  . Anxiety   . Bipolar 1 disorder     Past Surgical History  Procedure Laterality Date  . Tubal ligation    . Midtarsal arthrodesis Right 07/17/12  . Remove implant deep Right 07/17/12  . Lapidus fusion Right 01/10/12  . Carpal tunnel release Left 2001    Left wrist  . Gastroc recession extremity Right 3//20/14    Right foot  . Hernia repair      1996   History   Social History  . Marital Status: Single    Spouse Name: N/A    Number of Children: N/A  . Years of Education: N/A   Occupational History  . CASHIER at [employer]    Social History Main Topics  . Smoking status: Former Smoker -- 0.50 packs/day    Types: Cigarettes    Quit date: 07/03/2013  . Smokeless tobacco: Never Used  . Alcohol Use: No  . Drug Use: No  . Sexual Activity: No   Other Topics Concern  . Not on file   Social History Narrative   Lives with daughter, works at [employer], does not exercise but is on her feet at work.   Family History   Problem Relation Age of Onset  . Heart attack Mother   . Kidney disease Father    Allergies  Allergen Reactions  . Celery Oil Hives  . Pork-Derived Products Nausea And Vomiting    headach  . Latex Itching    Powdered latex gloves.     Scheduled Meds: . albuterol  2.5 mg Nebulization TID  . amLODipine  10 mg Oral Daily  . atenolol  50 mg Oral Daily  . Canagliflozin  300 mg Oral Daily  . DULoxetine  60 mg Oral Daily  . heparin  5,000 Units Subcutaneous 3 times per day  . ibuprofen  400 mg Oral TID  . insulin aspart protamine- aspart  35 Units Subcutaneous BID WC  . lisinopril  20 mg Oral BID  . pantoprazole  40 mg Oral Daily  . predniSONE  50 mg Oral Q supper  . risperiDONE  2 mg Oral QHS  . sucralfate  1 g Oral Daily  . trimethoprim-polymyxin b  1 drop Left Eye 6 times per day   Continuous Infusions:  PRN Meds:.albuterol, hydrOXYzine, LORazepam, nitroGLYCERIN, ondansetron  ROS: See neuro consult note 02/18/2014  EXAM: VA near with readers:  OD 20/30  OS Hand motion. Miscounts fingers. Fixes and follows finger well.  IOP by tonopen: OD 21, OS 21 EOM: Full versions Alignment: Orthotropic Pupils: Perrl, no visible RAPD CVF: Full OD. ?constricted OS.   Penlight exam: Lids: Wnl OU Conj - Quiet OU Cornea - Clear OU AC - Formed OU Lens - Clear OU  Phenylephrine and tropicamide instilled OU at 6:12 PM. Dilated Fundus Examination (20D lens): OD: Optic nerve pink, sharp, normal c/d ratio. Macula vessels and periphery normal except for temporal pigmented scar in retinal periphery. OS: Optic nerve pink and sharp, normal c/d ratio. Macula, vessels and periphery normal.   Imp/Plan: 1) Subjectively decreased vision OS, unclear etiology. DDX includes refractive error, including related to labile blood glucose, amblyopia, functional vision loss. Recommend outpatient follow up with her eye doctor. 2) Diabetes mellitus without retinopathy. 3) Chronic appearing chorioretinal scar  OD.

## 2014-02-18 NOTE — Progress Notes (Signed)
02/18/14 2207  BiPAP/CPAP/SIPAP  BiPAP/CPAP/SIPAP Pt Type Adult  Mask Type Full face mask  Mask Size Medium  Respiratory Rate 16 breaths/min  IPAP 8 cmH20  EPAP 8 cmH2O  Flow Rate 3 lpm  BiPAP/CPAP/SIPAP CPAP  Patient Home Equipment No  Auto Titrate No  BiPAP/CPAP /SiPAP Vitals  Pulse Rate 68  Resp 16  Patient placed on CPAP on above settings with O2 bleed in. She tolerates it very well at this time.

## 2014-02-19 ENCOUNTER — Inpatient Hospital Stay (HOSPITAL_COMMUNITY): Payer: Medicaid Other

## 2014-02-19 DIAGNOSIS — E119 Type 2 diabetes mellitus without complications: Secondary | ICD-10-CM

## 2014-02-19 LAB — GLUCOSE, CAPILLARY
GLUCOSE-CAPILLARY: 143 mg/dL — AB (ref 70–99)
Glucose-Capillary: 137 mg/dL — ABNORMAL HIGH (ref 70–99)
Glucose-Capillary: 157 mg/dL — ABNORMAL HIGH (ref 70–99)

## 2014-02-19 MED ORDER — DEXTROMETHORPHAN POLISTIREX 30 MG/5ML PO LQCR
30.0000 mg | Freq: Two times a day (BID) | ORAL | Status: DC
  Administered 2014-02-19: 30 mg via ORAL
  Filled 2014-02-19 (×3): qty 5

## 2014-02-19 MED ORDER — DEXTROMETHORPHAN POLISTIREX 30 MG/5ML PO LQCR: 15.0000 mg | Freq: Every evening | ORAL | Status: DC | PRN

## 2014-02-19 MED ORDER — POLYMYXIN B-TRIMETHOPRIM 10000-0.1 UNIT/ML-% OP SOLN: 1.0000 [drp] | OPHTHALMIC | Status: DC

## 2014-02-19 MED ORDER — PREDNISONE 1 MG PO TABS: ORAL_TABLET | ORAL | Status: DC

## 2014-02-19 NOTE — Discharge Summary (Signed)
Pt seen and examined. Agree with above discharge summary. Pt presents with asthma exacerbation likely related to second hand smoke. CXR on day of discharge was clear. Pt did complain initially of complete lack of vision of L eye, however, pt reported blurry vision to ophthalmologist, who recommends outpt follow up. Would have pt follow up closely as an outpatient.

## 2014-02-19 NOTE — Progress Notes (Signed)
Patient reporting chest pain.  Nitro Sublingual given.  PA Lacy Duverney and MD Sherrian Divers notified

## 2014-02-19 NOTE — Progress Notes (Signed)
NURSING PROGRESS NOTE  Diana Henderson [id number] Discharge Data: 02/19/2014 8:17 PM Attending Provider: Donne Hazel, MD KGM:[id number],UYQIHK, MD     [name] to be D/C'd Home per MD order.  Discussed with the patient the After Visit Summary and all questions fully answered. All IV's discontinued with no bleeding noted. All belongings returned to patient for patient to take home.   Last Vital Signs:  Blood pressure 95/54, pulse 62, temperature 98.1 F (36.7 C), temperature source Oral, resp. rate 16, height 5\' 6"  (1.676 m), weight 135.6 kg (298 lb 15.1 oz), last menstrual period 02/14/2014, SpO2 100.00%.  Discharge Medication List   Medication List         albuterol 108 (90 BASE) MCG/ACT inhaler  Commonly known as:  PROVENTIL HFA;VENTOLIN HFA  Inhale 2 puffs into the lungs every 4 (four) hours as needed for wheezing or shortness of breath.     amLODipine 10 MG tablet  Commonly known as:  NORVASC  Take 10 mg by mouth daily.     atenolol 50 MG tablet  Commonly known as:  TENORMIN  Take 50 mg by mouth daily.     cyclobenzaprine 10 MG tablet  Commonly known as:  FLEXERIL  Take 10 mg by mouth 3 (three) times daily as needed for muscle spasms.     dextromethorphan 30 MG/5ML liquid  Commonly known as:  DELSYM  Take 2.5 mLs (15 mg total) by mouth at bedtime as needed for cough.     DULoxetine 60 MG capsule  Commonly known as:  CYMBALTA  Take 60 mg by mouth daily.     hydrOXYzine 50 MG tablet  Commonly known as:  ATARAX/VISTARIL  Take 50 mg by mouth 3 (three) times daily as needed for anxiety.     insulin NPH-regular Human (70-30) 100 UNIT/ML injection  Commonly known as:  NOVOLIN 70/30  Inject 30 Units into the skin 2 (two) times daily with a meal.     INVOKANA 300 MG Tabs  Generic drug:  Canagliflozin  Take 300 mg by mouth daily.     ketoprofen 75 MG capsule  Commonly known as:  ORUDIS  Take 75 mg by mouth 3 (three) times daily as needed for pain  (pain).     lisinopril 20 MG tablet  Commonly known as:  PRINIVIL,ZESTRIL  Take 20 mg by mouth 2 (two) times daily.     LORazepam 1 MG tablet  Commonly known as:  ATIVAN  Take 1 mg by mouth 3 (three) times daily as needed for anxiety.     omeprazole 20 MG capsule  Commonly known as:  PRILOSEC  Take 20 mg by mouth daily.     ondansetron 8 MG disintegrating tablet  Commonly known as:  ZOFRAN ODT  Take 1 tablet (8 mg total) by mouth every 8 (eight) hours as needed for nausea or vomiting.     orphenadrine 100 MG tablet  Commonly known as:  NORFLEX  Take 100 mg by mouth 2 (two) times daily as needed for muscle spasms.     predniSONE 1 MG tablet  Commonly known as:  DELTASONE  - 60mg   x 3 days  - 50mg   x2 days  - 40 mg x1 day     risperiDONE 2 MG tablet  Commonly known as:  RISPERDAL  Take 2 mg by mouth at bedtime.     sucralfate 1 G tablet  Commonly known as:  CARAFATE  Take 1 g by mouth daily.  tiotropium 18 MCG inhalation capsule  Commonly known as:  SPIRIVA  Place 18 mcg into inhaler and inhale daily.     trimethoprim-polymyxin b ophthalmic solution  Commonly known as:  POLYTRIM  Place 1 drop into the left eye every 4 (four) hours.

## 2014-02-19 NOTE — Discharge Summary (Signed)
Physician Discharge Summary  Diana Henderson FOY:[id number] DOB: [birth year] DOA: 02/17/2014  PCP: Benito Mccreedy, MD  Admit date: 02/17/2014 Discharge date: 02/19/2014  Time spent: 40 minutes  Recommendations for Outpatient Follow-up:  1. PCP for Management of asthma, and referral to ophthalmologist for assessment of left eye visual change.   Discharge Diagnoses:  Principal Problem:   Acute respiratory failure with hypoxia Active Problems:   Major depressive disorder, recurrent, with catatonic features   Asthma exacerbation   Essential hypertension   Diabetes mellitus type 2, uncomplicated   Conjunctivitis   Vision loss, left eye   Discharge Condition: Stable  Diet recommendation: Crab modified diet  Filed Weights   02/17/14 1126 02/17/14 1720  Weight: 131.543 kg (290 lb) 135.6 kg (298 lb 15.1 oz)    History of present illness:   Diana Henderson is a 43 y.o. female with PMH of asthma, diabetes, hypertension, and depression who presented with shortness of breath for the past few days that progressively gotten worse. She has been recently sick, and went to her PCP who treated her with antibiotics and steroids, without much improvement on the SOB. She has associated nausea. She states she quit smoking 7 months ago, but is exposed to second hand smoking. She denied fever, cough, chest pain, abdominal pain, or any urinary complaints. In the Ed, she was given an albuterol treatment 1 hour, and when ambulated in room air, she desats to 90% from 100%. She is admitted, given IV steroids and transitioned to oral steroids.     Hospital Course:   Acute Asthma Exacerbation  -stable- lung clear bilaterally without any wheeze. Afebrile, no leukocytosis, and CXR without abnormalities. Was placed on   IV steroids and transitioned to oral prednisone.  -Discharged on oral prednisone taper, continue PRN albuterol inhaler   Ashthma, uncontolled  -exposed to second hand smoke at home  and only on albuterol inhaler- educated patient that this is the cause of her attacks  -will be discharged on prednisone steroid taper.   - PRN albuterol inhaler   Cough -new onset cough- non productive, afebrile. Lungs clear bilaterally without any wheezing.  100% O2 sat on room air.  CXR negative for acute abnormalities.  -discharged on oral cough medicine and oral steroid taper.   Hypertension  -stable on discharge -continue home regimen of lisinopril, amlodipine, atenolol   MDD, with catatonic features  -continue home regimen of cymbalta and risperidone   Diabetes  -CBG stable  -continue Insulin 70/30 and oral canagliflozin   costochondritis -patient with chest pain likely due to ongoing cough- chest pain is musculoskeletal in nature and is is reproducible with palpation of chest.  -EKG normal  -will discharge on oral cough suppressant.follow up with PCP for referral to cardiologist for further assessment  Conjunctivitis  -significant improvement on discharge- denies eye discharge and any visual abnormalitis -continue polytrim eye drops   Left eye pain/inability to see  -complains of moderate eye pain for past few months; and inability to see for the past month.  -Appreciate neurology consult- does not think this is neurological in origin, and advised to rule out glaucoma  -Appreciate opthalmology consult- unclear etiology, chronic appearing chorioretinal scar OD but no signs of diabetic retinopathy.  recommend outpatient opthalmology followup  Sleep apnea  -continue home CPAP   GERD  -continue home regimen of sucralafate and pantoperazole  Procedures: None Consultations: None  Discharge Exam: Filed Vitals:   02/19/14 0904  BP: 131/72  Pulse: 65  Temp:   Resp:  Exam General: A and oriented obese AA female in NAD.  100% on RA Eyes: Anicteric account.  Cardiovascular: Regular rate and rhythm.  No murmurs, rubs, or gallops. Respiratory: Clear to  auscultate bilaterally without wheezing.  No rhonchi or crepitations. Abdomen: Soft nontender bowel sounds present. No guarding or rigidity.  Musculoskeletal: No edema.  Psychiatric: Appears normal.  Neurologic: Alert awake oriented to time place and person.    Discharge Instructions     Medication List         albuterol 108 (90 BASE) MCG/ACT inhaler  Commonly known as:  PROVENTIL HFA;VENTOLIN HFA  Inhale 2 puffs into the lungs every 4 (four) hours as needed for wheezing or shortness of breath.     amLODipine 10 MG tablet  Commonly known as:  NORVASC  Take 10 mg by mouth daily.     atenolol 50 MG tablet  Commonly known as:  TENORMIN  Take 50 mg by mouth daily.     cyclobenzaprine 10 MG tablet  Commonly known as:  FLEXERIL  Take 10 mg by mouth 3 (three) times daily as needed for muscle spasms.     dextromethorphan 30 MG/5ML liquid  Commonly known as:  DELSYM  Take 2.5 mLs (15 mg total) by mouth at bedtime as needed for cough.     DULoxetine 60 MG capsule  Commonly known as:  CYMBALTA  Take 60 mg by mouth daily.     hydrOXYzine 50 MG tablet  Commonly known as:  ATARAX/VISTARIL  Take 50 mg by mouth 3 (three) times daily as needed for anxiety.     insulin NPH-regular Human (70-30) 100 UNIT/ML injection  Commonly known as:  NOVOLIN 70/30  Inject 30 Units into the skin 2 (two) times daily with a meal.     INVOKANA 300 MG Tabs  Generic drug:  Canagliflozin  Take 300 mg by mouth daily.     ketoprofen 75 MG capsule  Commonly known as:  ORUDIS  Take 75 mg by mouth 3 (three) times daily as needed for pain (pain).     lisinopril 20 MG tablet  Commonly known as:  PRINIVIL,ZESTRIL  Take 20 mg by mouth 2 (two) times daily.     LORazepam 1 MG tablet  Commonly known as:  ATIVAN  Take 1 mg by mouth 3 (three) times daily as needed for anxiety.     omeprazole 20 MG capsule  Commonly known as:  PRILOSEC  Take 20 mg by mouth daily.     ondansetron 8 MG disintegrating  tablet  Commonly known as:  ZOFRAN ODT  Take 1 tablet (8 mg total) by mouth every 8 (eight) hours as needed for nausea or vomiting.     orphenadrine 100 MG tablet  Commonly known as:  NORFLEX  Take 100 mg by mouth 2 (two) times daily as needed for muscle spasms.     predniSONE 1 MG tablet  Commonly known as:  DELTASONE  - 60mg   x 3 days  - 50mg   x2 days  - 40 mg x1 day     risperiDONE 2 MG tablet  Commonly known as:  RISPERDAL  Take 2 mg by mouth at bedtime.     sucralfate 1 G tablet  Commonly known as:  CARAFATE  Take 1 g by mouth daily.     tiotropium 18 MCG inhalation capsule  Commonly known as:  SPIRIVA  Place 18 mcg into inhaler and inhale daily.       Allergies  Allergen Reactions  .  Celery Oil Hives  . Pork-Derived Products Nausea And Vomiting    headach  . Latex Itching    Powdered latex gloves.     Follow-up Information   Follow up with OSEI-BONSU,GEORGE, MD. Schedule an appointment as soon as possible for a visit in 2 days.   Specialty:  Internal Medicine   Contact information:   [address] [place] [address] [phone]        The results of significant diagnostics from this hospitalization (including imaging, microbiology, ancillary and laboratory) are listed below for reference.    Significant Diagnostic Studies: Dg Chest 2 View  02/19/2014   CLINICAL DATA:  Cough with shortness of breath and weakness  EXAM: CHEST  2 VIEW  COMPARISON:  PA and lateral chest dated February 17, 2014  FINDINGS: The lungs are well-expanded. The interstitial markings remain mildly prominent. The cardiopericardial silhouette remains enlarged. The pulmonary vascularity is not clearly engorged. There is no pleural effusion. The bony thorax is unremarkable.  IMPRESSION: There is no acute cardiopulmonary abnormality.   Electronically Signed   By: David  Martinique   On: 02/19/2014 10:43   Dg Chest 2 View  02/17/2014   CLINICAL DATA:  Cough and sore throat   EXAM: CHEST  2 VIEW  COMPARISON:  01/28/2014  FINDINGS: Cardiac shadow is stable. The lungs are well aerated bilaterally without focal infiltrate. No sizable effusion is seen. No acute bony abnormality is noted.  IMPRESSION: No active cardiopulmonary disease.   Electronically Signed   By: Inez Catalina M.D.   On: 02/17/2014 14:46   Dg Chest 2 View  01/28/2014   CLINICAL DATA:  Cough and congestion and chest pain with history of diabetes and asthma  EXAM: CHEST  2 VIEW  COMPARISON:  PA and lateral chest of January 13, 2014  FINDINGS: The lungs are adequately inflated. There is no focal infiltrate. The interstitial markings are coarse though stable. The cardiac silhouette is top-normal in size but stable. The pulmonary vascularity is normal. There is no pleural effusion. The trachea is midline. There is mild degenerative disc change at multiple thoracic levels.  IMPRESSION: There are chronically increased pulmonary interstitial markings likely related to asthma and previous tobacco use. There is no evidence of CHF nor pneumonia.   Electronically Signed   By: David  Martinique   On: 01/28/2014 07:54    Microbiology: No results found for this or any previous visit (from the past 240 hour(s)).   Labs: Basic Metabolic Panel:  Recent Labs Lab 02/17/14 1447 02/17/14 1837 02/18/14 1050  NA 139 138 139  K 3.7 3.3* 4.2  CL 101 100 100  CO2 23 21 22   GLUCOSE 156* 271* 191*  BUN 6 7 12   CREATININE 0.90 0.88 1.01  CALCIUM 9.2 8.8 9.1   Liver Function Tests:  Recent Labs Lab 02/17/14 1837 02/18/14 1050  AST 17 18  ALT 16 14  ALKPHOS 80 72  BILITOT 0.4 0.3  PROT 7.5 7.4  ALBUMIN 3.8 3.5   No results found for this basename: LIPASE, AMYLASE,  in the last 168 hours No results found for this basename: AMMONIA,  in the last 168 hours CBC:  Recent Labs Lab 02/17/14 1447 02/17/14 1837  WBC 7.0 8.3  NEUTROABS 4.5  --   HGB 14.3 13.6  HCT 43.4 41.5  MCV 85.3 84.7  PLT 173 183   Cardiac  Enzymes:  Recent Labs Lab 02/17/14 2034 02/18/14 0122 02/18/14 1050  TROPONINI <0.30 <0.30 <  0.30   BNP: BNP (last 3 results)  Recent Labs  07/07/13 1019 01/13/14 0845  PROBNP 9.6 13.3   CBG:  Recent Labs Lab 02/17/14 1732 02/19/14 0759 02/19/14 1136  GLUCAP 202* 137* 143*       Signed:  Lacy Duverney PA-C  [facility] 02/19/2014, 11:53 AM

## 2014-03-02 LAB — PULMONARY FUNCTION TEST

## 2014-03-10 ENCOUNTER — Ambulatory Visit (INDEPENDENT_AMBULATORY_CARE_PROVIDER_SITE_OTHER): Payer: Medicaid Other | Admitting: Podiatry

## 2014-03-10 ENCOUNTER — Encounter: Payer: Self-pay | Admitting: Podiatry

## 2014-03-10 VITALS — BP 165/102 | HR 95 | Ht 66.0 in | Wt 297.0 lb

## 2014-03-10 DIAGNOSIS — M25571 Pain in right ankle and joints of right foot: Secondary | ICD-10-CM

## 2014-03-10 DIAGNOSIS — M6740 Ganglion, unspecified site: Secondary | ICD-10-CM

## 2014-03-10 DIAGNOSIS — M79609 Pain in unspecified limb: Secondary | ICD-10-CM

## 2014-03-10 DIAGNOSIS — M659 Synovitis and tenosynovitis, unspecified: Secondary | ICD-10-CM

## 2014-03-10 DIAGNOSIS — M65979 Unspecified synovitis and tenosynovitis, unspecified ankle and foot: Secondary | ICD-10-CM

## 2014-03-10 DIAGNOSIS — D492 Neoplasm of unspecified behavior of bone, soft tissue, and skin: Secondary | ICD-10-CM

## 2014-03-10 MED ORDER — OXYCODONE-ACETAMINOPHEN 10-325 MG PO TABS: 1.0000 | ORAL_TABLET | ORAL | Status: DC

## 2014-03-10 NOTE — Progress Notes (Signed)
Subjective:  43 year old female presents stating that she has pain on right foot with a knot on top of foot that is painful for the past 2 weeks.  She has a history of a brick fell on top of right foot near at old surgery site in May 2015.  Walks with cane because of poor balance. Foot swells inside tennis shoe and hurts. So wears surgical cast boot.  Patient request for a new boot and prescription for pain medication.  Stated that she was approved for disability effective January 2016.  Objective: Palpable soft tissue mass, size of a pea over end of old incision dorsum of right foot. Pain with light pressure. No visible discoloration or edema noted on right foot.  Pain and swelling with ambulation right foot.   Assessment:  Status post Lapidus fusion with post surgical pain at surgery site. History of trauma to right foot from fallen brick without damage to osseous structure.  Ganglionic cyst dorsum right, size of a pea at proximal end of old incision. Difficulty walking with poor balance.   Plan:  Reviewed findings.  Compression applied to right foot affected area with Ace bandage. Patient is to get elastic OTC ankle support to provide compression. If pain continue return for Cortisone injection. Explained that if conservative treatment fails to improve, it may require excision of the lesion. Continue to walk in surgical cast boot with aid of cane.  Pain medication as needed.

## 2014-03-10 NOTE — Patient Instructions (Signed)
Seen for pain in right with palpable mass soft and size of a pea. Possible Ganglionic cyst under old scar right foot dorsum. Will try compression now with Ace bandage or with elastic Ankle support. If no change will try Cortisone injection on next visit.  Return in one month if pain is not resolved.  Dispensed Cast boot to replace old one.

## 2014-03-11 DIAGNOSIS — F332 Major depressive disorder, recurrent severe without psychotic features: Secondary | ICD-10-CM | POA: Diagnosis not present

## 2014-04-12 DIAGNOSIS — F332 Major depressive disorder, recurrent severe without psychotic features: Secondary | ICD-10-CM | POA: Diagnosis not present

## 2014-04-18 ENCOUNTER — Emergency Department (HOSPITAL_COMMUNITY): Payer: Medicaid Other

## 2014-04-18 ENCOUNTER — Encounter (HOSPITAL_COMMUNITY): Payer: Self-pay | Admitting: *Deleted

## 2014-04-18 ENCOUNTER — Emergency Department (HOSPITAL_COMMUNITY)
Admission: EM | Admit: 2014-04-18 | Discharge: 2014-04-18 | Disposition: A | Discharge status: Home / Self Care | Payer: Medicaid Other | Attending: Emergency Medicine | Admitting: Emergency Medicine

## 2014-04-18 DIAGNOSIS — F319 Bipolar disorder, unspecified: Secondary | ICD-10-CM | POA: Diagnosis not present

## 2014-04-18 DIAGNOSIS — Z87891 Personal history of nicotine dependence: Secondary | ICD-10-CM | POA: Diagnosis not present

## 2014-04-18 DIAGNOSIS — I252 Old myocardial infarction: Secondary | ICD-10-CM | POA: Diagnosis not present

## 2014-04-18 DIAGNOSIS — Z8669 Personal history of other diseases of the nervous system and sense organs: Secondary | ICD-10-CM | POA: Insufficient documentation

## 2014-04-18 DIAGNOSIS — I10 Essential (primary) hypertension: Secondary | ICD-10-CM | POA: Insufficient documentation

## 2014-04-18 DIAGNOSIS — M79671 Pain in right foot: Secondary | ICD-10-CM | POA: Diagnosis present

## 2014-04-18 DIAGNOSIS — E669 Obesity, unspecified: Secondary | ICD-10-CM | POA: Diagnosis not present

## 2014-04-18 DIAGNOSIS — R609 Edema, unspecified: Secondary | ICD-10-CM | POA: Diagnosis not present

## 2014-04-18 DIAGNOSIS — E119 Type 2 diabetes mellitus without complications: Secondary | ICD-10-CM | POA: Diagnosis not present

## 2014-04-18 DIAGNOSIS — M25571 Pain in right ankle and joints of right foot: Secondary | ICD-10-CM | POA: Diagnosis not present

## 2014-04-18 DIAGNOSIS — J45909 Unspecified asthma, uncomplicated: Secondary | ICD-10-CM | POA: Diagnosis not present

## 2014-04-18 DIAGNOSIS — F419 Anxiety disorder, unspecified: Secondary | ICD-10-CM | POA: Diagnosis not present

## 2014-04-18 DIAGNOSIS — M79609 Pain in unspecified limb: Secondary | ICD-10-CM

## 2014-04-18 DIAGNOSIS — Z79899 Other long term (current) drug therapy: Secondary | ICD-10-CM | POA: Diagnosis not present

## 2014-04-18 DIAGNOSIS — Z794 Long term (current) use of insulin: Secondary | ICD-10-CM | POA: Insufficient documentation

## 2014-04-18 DIAGNOSIS — M25561 Pain in right knee: Secondary | ICD-10-CM

## 2014-04-18 DIAGNOSIS — Z91040 Latex allergy status: Secondary | ICD-10-CM | POA: Insufficient documentation

## 2014-04-18 DIAGNOSIS — R52 Pain, unspecified: Secondary | ICD-10-CM

## 2014-04-18 MED ORDER — HYDROCODONE-ACETAMINOPHEN 5-325 MG PO TABS: ORAL_TABLET | ORAL | Status: DC

## 2014-04-18 MED ORDER — HYDROCODONE-ACETAMINOPHEN 5-325 MG PO TABS
1.0000 | ORAL_TABLET | Freq: Once | ORAL | Status: AC
  Administered 2014-04-18: 1 via ORAL
  Filled 2014-04-18: qty 1

## 2014-04-18 NOTE — ED Provider Notes (Signed)
CSN: [id number]     Arrival date & time 04/18/14  1205 History   First MD Initiated Contact with Patient 04/18/14 1259     Chief Complaint  Patient presents with  . Leg Pain  . Foot Pain     (Consider location/radiation/quality/duration/timing/severity/associated sxs/prior Treatment) HPI   Diana Henderson is a 43 y.o. female with past medical history significant for insulin-dependent diabetes, hypertension, hyperlipidemia complaining of worsening pain to right foot, calf and leg over the last several weeks. Patient is status post remote ORIF to foot by [facility] orthopedist, plan may be to reexplore the foot in January. Patient ambulates with a cane and has been ambulating at her baseline, she has no pain medication other than Tylenol. She denies history of DVT or PE, chest pain. Endorses a mild shortness of breath. Denies cough, fever, difficulty ambulating, lesion or sore.   Past Medical History  Diagnosis Date  . Diabetes mellitus   . Hypertension   . Myocardial infarction   . Asthma   . Hyperlipidemia   . Sleep apnea   . Depression   . PTSD (post-traumatic stress disorder)   . Pain in joint, ankle and foot 02/17/2013  . Anxiety   . Bipolar 1 disorder    Past Surgical History  Procedure Laterality Date  . Tubal ligation    . Midtarsal arthrodesis Right 07/17/12  . Remove implant deep Right 07/17/12  . Lapidus fusion Right 01/10/12  . Carpal tunnel release Left 2001    Left wrist  . Gastroc recession extremity Right 3//20/14    Right foot  . Hernia repair      1996   Family History  Problem Relation Age of Onset  . Heart attack Mother   . Kidney disease Father    History  Substance Use Topics  . Smoking status: Former Smoker -- 0.50 packs/day    Types: Cigarettes    Quit date: 07/03/2013  . Smokeless tobacco: Never Used  . Alcohol Use: No   OB History    No data available     Review of Systems  10 systems reviewed and found to be negative, except as noted  in the HPI.   Allergies  Celery oil; Pork-derived products; and Latex  Home Medications   Prior to Admission medications   Medication Sig Start Date End Date Taking? Authorizing Provider  albuterol (PROVENTIL HFA;VENTOLIN HFA) 108 (90 BASE) MCG/ACT inhaler Inhale 2 puffs into the lungs every 4 (four) hours as needed for wheezing or shortness of breath.    Historical Provider, MD  amLODipine (NORVASC) 10 MG tablet Take 10 mg by mouth daily.    Historical Provider, MD  atenolol (TENORMIN) 50 MG tablet Take 50 mg by mouth daily.    Historical Provider, MD  Canagliflozin (INVOKANA) 300 MG TABS Take 300 mg by mouth daily.    Historical Provider, MD  cyclobenzaprine (FLEXERIL) 10 MG tablet Take 10 mg by mouth 3 (three) times daily as needed for muscle spasms.     Historical Provider, MD  dextromethorphan (DELSYM) 30 MG/5ML liquid Take 2.5 mLs (15 mg total) by mouth at bedtime as needed for cough. 02/19/14   Lacy Duverney, PA-C  DULoxetine (CYMBALTA) 60 MG capsule Take 60 mg by mouth daily.    Historical Provider, MD  HYDROcodone-acetaminophen (NORCO/VICODIN) 5-325 MG per tablet Take 1-2 tablets by mouth every 6 hours as needed for pain. 04/18/14   Kazden Largo, PA-C  hydrOXYzine (ATARAX/VISTARIL) 50 MG tablet Take 50 mg by mouth  3 (three) times daily as needed for anxiety.     Historical Provider, MD  insulin NPH-regular Human (NOVOLIN 70/30) (70-30) 100 UNIT/ML injection Inject 30 Units into the skin 2 (two) times daily with a meal.    Historical Provider, MD  ketoprofen (ORUDIS) 75 MG capsule Take 75 mg by mouth 3 (three) times daily as needed for pain (pain).    Historical Provider, MD  lisinopril (PRINIVIL,ZESTRIL) 20 MG tablet Take 20 mg by mouth 2 (two) times daily.     Historical Provider, MD  LORazepam (ATIVAN) 1 MG tablet Take 1 mg by mouth 3 (three) times daily as needed for anxiety.    Historical Provider, MD  omeprazole (PRILOSEC) 20 MG capsule Take 20 mg by mouth daily.    Historical  Provider, MD  ondansetron (ZOFRAN ODT) 8 MG disintegrating tablet Take 1 tablet (8 mg total) by mouth every 8 (eight) hours as needed for nausea or vomiting. 01/28/14   Ephraim Hamburger, MD  orphenadrine (NORFLEX) 100 MG tablet Take 100 mg by mouth 2 (two) times daily as needed for muscle spasms.    Historical Provider, MD  oxyCODONE-acetaminophen (PERCOCET) 10-325 MG per tablet Take 1 tablet by mouth 1 day or 1 dose. 03/10/14   Myeong Sheard, DPM  predniSONE (DELTASONE) 1 MG tablet 60mg   x 3 days 50mg   x2 days 40 mg x1 day 02/19/14   Lacy Duverney, PA-C  risperiDONE (RISPERDAL) 2 MG tablet Take 2 mg by mouth at bedtime.    Historical Provider, MD  sucralfate (CARAFATE) 1 G tablet Take 1 g by mouth daily.    Historical Provider, MD  tiotropium (SPIRIVA) 18 MCG inhalation capsule Place 18 mcg into inhaler and inhale daily.    Historical Provider, MD  trimethoprim-polymyxin b (POLYTRIM) ophthalmic solution Place 1 drop into the left eye every 4 (four) hours. 02/19/14   Donne Hazel, MD   BP 142/83 mmHg  Pulse 79  Temp(Src) 98.2 F (36.8 C) (Oral)  Resp 18  SpO2 100%  LMP 04/09/2014 Physical Exam  Constitutional: She is oriented to person, place, and time. She appears well-developed and well-nourished. No distress.  obese  HENT:  Head: Normocephalic.  Eyes: Conjunctivae and EOM are normal.  Cardiovascular: Normal rate, regular rhythm and intact distal pulses.   Pulmonary/Chest: Effort normal and breath sounds normal. No stridor.  Musculoskeletal: Normal range of motion. She exhibits edema and tenderness.  Right knee: No deformity, erythema or abrasions.mildly reduced ROM, ++ crepitance.  Anterior and posterior drawer show no abnormal laxity. Stable to valgus and varus stress. Joint lines are non-tender. Neurovascularly intact. Pt ambulates with antalgic gait with cane.   No significant calf asymmetry, Homans + on right  Remote surgical scar to dorsum of right foot. FROM to ankle and toes,  NVI   Neurological: She is alert and oriented to person, place, and time.  Psychiatric: She has a normal mood and affect.  Nursing note and vitals reviewed.   ED Course  Procedures (including critical care time) Labs Review Labs Reviewed - No data to display  Imaging Review Dg Knee Complete 4 Views Right  04/18/2014   CLINICAL DATA:  Right leg tightness and swelling for 3 weeks around the right knee.  EXAM: RIGHT KNEE - COMPLETE 4+ VIEW  COMPARISON:  11/12/2012  FINDINGS: Negative for a fracture or dislocation. Again noted is spurring in the lateral knee compartment. Mild spurring in the medial knee compartment. Degenerative changes and irregularity along the superior aspect of  the patella. Difficult to exclude a small amount of joint fluid.  IMPRESSION: Mild to moderate osteoarthritis in the right knee. No acute bone abnormality.   Electronically Signed   By: Markus Daft M.D.   On: 04/18/2014 17:07   Dg Foot Complete Right  04/18/2014   CLINICAL DATA:  Right leg tightness and swelling. Right foot pain since surgery 2 years ago.  EXAM: RIGHT FOOT COMPLETE - 3+ VIEW  COMPARISON:  02/06/2013  FINDINGS: Plate and screws are present within the base of the first metatarsal and medial cuneiform for arthrodesis. There is a screw that transfixes the base of the first and second metatarsals. No breakage of the hardware. No definite acute fracture. Has plan as. Spurring at the inferior calcaneus.  IMPRESSION: No acute bony injury.  Postoperative changes.   Electronically Signed   By: Maryclare Bean M.D.   On: 04/18/2014 14:55     EKG Interpretation None      MDM   Final diagnoses:  Pain  Arthralgia of right foot  Arthralgia of right knee    Filed Vitals:   04/18/14 1225 04/18/14 1612 04/18/14 1800  BP: 163/95 135/86 142/83  Pulse: 79 76 79  Temp: 97.9 F (36.6 C) 98.2 F (36.8 C)   TempSrc: Oral Oral Oral  Resp: 16 14 18   SpO2: 99% 99% 100%    Medications  HYDROcodone-acetaminophen  (NORCO/VICODIN) 5-325 MG per tablet 1 tablet (1 tablet Oral Given 04/18/14 1313)    Shamia Uppal is a 43 y.o. female presenting with atraumatic exacerbation of chronic RLE pain. XR neg for acute abnormality. Prelim read of DVT study negative. Pt ambulatory with cane and pain improved in ED. Encouraged close follow up with ortho.   Evaluation does not show pathology that would require ongoing emergent intervention or inpatient treatment. Pt is hemodynamically stable and mentating appropriately. Discussed findings and plan with patient/guardian, who agrees with care plan. All questions answered. Return precautions discussed and outpatient follow up given.   Discharge Medication List as of 04/18/2014  5:56 PM    START taking these medications   Details  HYDROcodone-acetaminophen (NORCO/VICODIN) 5-325 MG per tablet Take 1-2 tablets by mouth every 6 hours as needed for pain., Print             Elmyra Ricks Vernadine Coombs, PA-C 04/19/14 1300

## 2014-04-18 NOTE — ED Notes (Signed)
To ED for eval of right foot radiating up to knee since 2013 when she had surgery in [place]. States pain is increasing. No injury. Seen by ortho 2 months ago in [place] and states she advised to come back in Jan. Pt ambulatory.

## 2014-04-18 NOTE — Discharge Instructions (Signed)
Take percocet for breakthrough pain, do not drink alcohol, drive, care for children or do other critical tasks while taking percocet. ° °Please follow with your primary care doctor in the next 2 days for a check-up. They must obtain records for further management.  ° °Do not hesitate to return to the Emergency Department for any new, worsening or concerning symptoms.  ° °

## 2014-04-18 NOTE — ED Provider Notes (Signed)
Diana Henderson is a 43 y.o. female presents with pain to right ankle calf and knee, atraumatic and radiating up the leg.  Pt ambulatory with cane at baseline and here in the ED.  DVT study without clot, knee x-ray pending.  Pt has follow-up with ortho in [place].    Care assumed from [facility], PA-C.  Physical Exam  BP 135/86 mmHg  Pulse 76  Temp(Src) 98.2 F (36.8 C) (Oral)  Resp 14  SpO2 99%  LMP 04/09/2014  Physical Exam  Face to face Exam:   General: Awake  HEENT: Atraumatic  Resp: Normal effort  Abd: Nondistended  Neuro:No focal weakness, ambulates with cane and steady gait  Lymph: No adenopathy  Results for orders placed or performed during the hospital encounter of 02/17/14  CBC with Differential  Result Value Ref Range   WBC 7.0 4.0 - 10.5 K/uL   RBC 5.09 3.87 - 5.11 MIL/uL   Hemoglobin 14.3 12.0 - 15.0 g/dL   HCT 43.4 36.0 - 46.0 %   MCV 85.3 78.0 - 100.0 fL   MCH 28.1 26.0 - 34.0 pg   MCHC 32.9 30.0 - 36.0 g/dL   RDW 14.3 11.5 - 15.5 %   Platelets 173 150 - 400 K/uL   Neutrophils Relative % 64 43 - 77 %   Neutro Abs 4.5 1.7 - 7.7 K/uL   Lymphocytes Relative 29 12 - 46 %   Lymphs Abs 2.0 0.7 - 4.0 K/uL   Monocytes Relative 7 3 - 12 %   Monocytes Absolute 0.5 0.1 - 1.0 K/uL   Eosinophils Relative 0 0 - 5 %   Eosinophils Absolute 0.0 0.0 - 0.7 K/uL   Basophils Relative 0 0 - 1 %   Basophils Absolute 0.0 0.0 - 0.1 K/uL  Basic metabolic panel  Result Value Ref Range   Sodium 139 137 - 147 mEq/L   Potassium 3.7 3.7 - 5.3 mEq/L   Chloride 101 96 - 112 mEq/L   CO2 23 19 - 32 mEq/L   Glucose, Bld 156 (H) 70 - 99 mg/dL   BUN 6 6 - 23 mg/dL   Creatinine, Ser 0.90 0.50 - 1.10 mg/dL   Calcium 9.2 8.4 - 10.5 mg/dL   GFR calc non Af Amer 76 (L) >90 mL/min   GFR calc Af Amer 88 (L) >90 mL/min   Anion gap 15 5 - 15  CBC  Result Value Ref Range   WBC 8.3 4.0 - 10.5 K/uL   RBC 4.90 3.87 - 5.11 MIL/uL   Hemoglobin 13.6 12.0 - 15.0 g/dL   HCT 41.5 36.0 -  46.0 %   MCV 84.7 78.0 - 100.0 fL   MCH 27.8 26.0 - 34.0 pg   MCHC 32.8 30.0 - 36.0 g/dL   RDW 14.4 11.5 - 15.5 %   Platelets 183 150 - 400 K/uL  Comprehensive metabolic panel  Result Value Ref Range   Sodium 138 137 - 147 mEq/L   Potassium 3.3 (L) 3.7 - 5.3 mEq/L   Chloride 100 96 - 112 mEq/L   CO2 21 19 - 32 mEq/L   Glucose, Bld 271 (H) 70 - 99 mg/dL   BUN 7 6 - 23 mg/dL   Creatinine, Ser 0.88 0.50 - 1.10 mg/dL   Calcium 8.8 8.4 - 10.5 mg/dL   Total Protein 7.5 6.0 - 8.3 g/dL   Albumin 3.8 3.5 - 5.2 g/dL   AST 17 0 - 37 U/L   ALT 16 0 - 35  U/L   Alkaline Phosphatase 80 39 - 117 U/L   Total Bilirubin 0.4 0.3 - 1.2 mg/dL   GFR calc non Af Amer 78 (L) >90 mL/min   GFR calc Af Amer >90 >90 mL/min   Anion gap 17 (H) 5 - 15  Comprehensive metabolic panel  Result Value Ref Range   Sodium 139 137 - 147 mEq/L   Potassium 4.2 3.7 - 5.3 mEq/L   Chloride 100 96 - 112 mEq/L   CO2 22 19 - 32 mEq/L   Glucose, Bld 191 (H) 70 - 99 mg/dL   BUN 12 6 - 23 mg/dL   Creatinine, Ser 1.01 0.50 - 1.10 mg/dL   Calcium 9.1 8.4 - 10.5 mg/dL   Total Protein 7.4 6.0 - 8.3 g/dL   Albumin 3.5 3.5 - 5.2 g/dL   AST 18 0 - 37 U/L   ALT 14 0 - 35 U/L   Alkaline Phosphatase 72 39 - 117 U/L   Total Bilirubin 0.3 0.3 - 1.2 mg/dL   GFR calc non Af Amer 66 (L) >90 mL/min   GFR calc Af Amer 77 (L) >90 mL/min   Anion gap 17 (H) 5 - 15  Urine rapid drug screen (hosp performed)  Result Value Ref Range   Opiates NONE DETECTED NONE DETECTED   Cocaine NONE DETECTED NONE DETECTED   Benzodiazepines NONE DETECTED NONE DETECTED   Amphetamines NONE DETECTED NONE DETECTED   Tetrahydrocannabinol NONE DETECTED NONE DETECTED   Barbiturates NONE DETECTED NONE DETECTED  Glucose, capillary  Result Value Ref Range   Glucose-Capillary 202 (H) 70 - 99 mg/dL  Troponin I (q 6hr x 3)  Result Value Ref Range   Troponin I <0.30 <0.30 ng/mL  Troponin I (q 6hr x 3)  Result Value Ref Range   Troponin I <0.30 <0.30 ng/mL   Troponin I (q 6hr x 3)  Result Value Ref Range   Troponin I <0.30 <0.30 ng/mL  D-dimer, quantitative  Result Value Ref Range   D-Dimer, Quant 0.36 0.00 - 0.48 ug/mL-FEU  Glucose, capillary  Result Value Ref Range   Glucose-Capillary 137 (H) 70 - 99 mg/dL  Glucose, capillary  Result Value Ref Range   Glucose-Capillary 143 (H) 70 - 99 mg/dL  Glucose, capillary  Result Value Ref Range   Glucose-Capillary 157 (H) 70 - 99 mg/dL   Dg Foot Complete Right  04/18/2014   CLINICAL DATA:  Right leg tightness and swelling. Right foot pain since surgery 2 years ago.  EXAM: RIGHT FOOT COMPLETE - 3+ VIEW  COMPARISON:  02/06/2013  FINDINGS: Plate and screws are present within the base of the first metatarsal and medial cuneiform for arthrodesis. There is a screw that transfixes the base of the first and second metatarsals. No breakage of the hardware. No definite acute fracture. Has plan as. Spurring at the inferior calcaneus.  IMPRESSION: No acute bony injury.  Postoperative changes.   Electronically Signed   By: Maryclare Bean M.D.   On: 04/18/2014 14:55     ED Course  Procedures  MDM   Plan:  Knee x-ray pending; may be d/c home if no acute findings.    5:06 PM X-ray without acute abnormality.  Results discussed with patient.  Pt to be d/c home.  Pt reports that she does not have an orthopedic physician.  Will d/c with referral to Dr. Doran Durand.  BP 135/86 mmHg  Pulse 76  Temp(Src) 98.2 F (36.8 C) (Oral)  Resp 14  SpO2 99%  LMP 04/09/2014      Abigail Butts, PA-C 04/18/14 1757

## 2014-04-18 NOTE — Progress Notes (Signed)
VASCULAR LAB PRELIMINARY  PRELIMINARY  PRELIMINARY  PRELIMINARY  Right lower extremity venous Doppler completed.    Preliminary report: There is no DVT or SVT noted in the right lower extremity.   Alga Southall, RVT 04/18/2014, 3:41 PM

## 2014-04-18 NOTE — ED Notes (Signed)
To vascular lab

## 2014-04-27 DIAGNOSIS — F332 Major depressive disorder, recurrent severe without psychotic features: Secondary | ICD-10-CM | POA: Diagnosis not present

## 2014-05-04 ENCOUNTER — Ambulatory Visit (INDEPENDENT_AMBULATORY_CARE_PROVIDER_SITE_OTHER): Payer: Medicaid Other | Admitting: Neurology

## 2014-05-04 ENCOUNTER — Encounter: Payer: Self-pay | Admitting: Neurology

## 2014-05-04 VITALS — BP 135/82 | HR 96 | Ht 66.0 in | Wt 299.4 lb

## 2014-05-04 DIAGNOSIS — G5600 Carpal tunnel syndrome, unspecified upper limb: Secondary | ICD-10-CM | POA: Insufficient documentation

## 2014-05-04 DIAGNOSIS — G609 Hereditary and idiopathic neuropathy, unspecified: Secondary | ICD-10-CM | POA: Insufficient documentation

## 2014-05-04 DIAGNOSIS — G5602 Carpal tunnel syndrome, left upper limb: Secondary | ICD-10-CM

## 2014-05-04 DIAGNOSIS — G4733 Obstructive sleep apnea (adult) (pediatric): Secondary | ICD-10-CM

## 2014-05-04 NOTE — Progress Notes (Signed)
[facility]    Provider:  Dr Jaynee Eagles Referring Provider: Benito Mccreedy, MD Primary Care Physician:  Benito Mccreedy, MD  CC:  Left hand pain  HPI:  Diana Henderson is a 43 y.o. female here as a referral from Dr. Vista Lawman for left hand pain. She also has OSA, DM2, HTN, COPD, Morbid obesity. neuropathy.  She has previous CTS release on the left hand. Now with 3 weeks of worsening numbness in the digits 1-2 of the left hand. Throbbing and weakness, dropping everything. She can't hold a spoon. Getting worse. Is right handed. She is waking up in the middle of the night with numbness and having to shake out her hand. Worse with driving a car or any use. Continuous, shaking it out. No neck pain. No inciting factors. Associated with radiation up the arm.   Reviewed notes, labs and imaging from outside physicians, which showed; HgbA1c 8.6cmp/cbc unremarkable.b12 278.   Review of Systems: Patient complains of symptoms per HPI as well as the following symptoms swelling in legs, allergies, runny nose. Pertinent negatives per HPI. All others negative.   History   Social History  . Marital Status: Single    Spouse Name: N/A    Number of Children: 1  . Years of Education: 12   Occupational History  . CASHIER at [employer]    Social History Main Topics  . Smoking status: Former Smoker -- 0.50 packs/day    Types: Cigarettes    Quit date: 07/03/2013  . Smokeless tobacco: Never Used  . Alcohol Use: No  . Drug Use: No  . Sexual Activity: No   Other Topics Concern  . Not on file   Social History Narrative   Lives with daughter, works at [employer], does not exercise but is on her feet at work.    Family History  Problem Relation Age of Onset  . Heart attack Mother   . Kidney disease Father     Past Medical History  Diagnosis Date  . Diabetes mellitus   . Hypertension   . Myocardial infarction   . Asthma   . Hyperlipidemia   . Sleep apnea   . Depression     . PTSD (post-traumatic stress disorder)   . Pain in joint, ankle and foot 02/17/2013  . Anxiety   . Bipolar 1 disorder     Past Surgical History  Procedure Laterality Date  . Tubal ligation    . Midtarsal arthrodesis Right 07/17/12  . Remove implant deep Right 07/17/12  . Lapidus fusion Right 01/10/12  . Carpal tunnel release Left 2001    Left wrist  . Gastroc recession extremity Right 3//20/14    Right foot  . Hernia repair      1996    Current Outpatient Prescriptions  Medication Sig Dispense Refill  . albuterol (PROVENTIL HFA;VENTOLIN HFA) 108 (90 BASE) MCG/ACT inhaler Inhale 2 puffs into the lungs every 4 (four) hours as needed for wheezing or shortness of breath.    Marland Kitchen amLODipine (NORVASC) 10 MG tablet Take 10 mg by mouth daily.    Marland Kitchen atenolol (TENORMIN) 50 MG tablet Take 50 mg by mouth daily.    . Canagliflozin (INVOKANA) 300 MG TABS Take 300 mg by mouth daily.    . cyclobenzaprine (FLEXERIL) 10 MG tablet Take 10 mg by mouth 3 (three) times daily as needed for muscle spasms.     Marland Kitchen dextromethorphan (DELSYM) 30 MG/5ML liquid Take 2.5 mLs (15 mg total) by mouth at bedtime as needed for cough.  90 mL 0  . DULoxetine (CYMBALTA) 60 MG capsule Take 60 mg by mouth daily.    . hydrOXYzine (ATARAX/VISTARIL) 50 MG tablet Take 50 mg by mouth 3 (three) times daily as needed for anxiety.     . insulin NPH-regular Human (NOVOLIN 70/30) (70-30) 100 UNIT/ML injection Inject 30 Units into the skin 2 (two) times daily with a meal.    . ketoprofen (ORUDIS) 75 MG capsule Take 75 mg by mouth 3 (three) times daily as needed for pain (pain).    Marland Kitchen lisinopril (PRINIVIL,ZESTRIL) 20 MG tablet Take 20 mg by mouth 2 (two) times daily.     Marland Kitchen LORazepam (ATIVAN) 1 MG tablet Take 1 mg by mouth 3 (three) times daily as needed for anxiety.    Marland Kitchen omeprazole (PRILOSEC) 20 MG capsule Take 20 mg by mouth daily.    . ondansetron (ZOFRAN ODT) 8 MG disintegrating tablet Take 1 tablet (8 mg total) by mouth every 8 (eight)  hours as needed for nausea or vomiting. 15 tablet 0  . orphenadrine (NORFLEX) 100 MG tablet Take 100 mg by mouth 2 (two) times daily as needed for muscle spasms.    Marland Kitchen oxyCODONE-acetaminophen (PERCOCET) 10-325 MG per tablet as needed.  0  . risperiDONE (RISPERDAL) 2 MG tablet Take 2 mg by mouth at bedtime.    . sucralfate (CARAFATE) 1 G tablet Take 1 g by mouth daily.    Marland Kitchen tiotropium (SPIRIVA) 18 MCG inhalation capsule Place 18 mcg into inhaler and inhale daily.    Marland Kitchen trimethoprim-polymyxin b (POLYTRIM) ophthalmic solution Place 1 drop into the left eye every 4 (four) hours. 10 mL 0   No current facility-administered medications for this visit.    Allergies as of 05/04/2014 - Review Complete 05/04/2014  Allergen Reaction Noted  . Celery oil Hives 11/12/2012  . Pork-derived products Nausea And Vomiting 02/17/2014  . Latex Itching 11/12/2012    Vitals: BP 135/82 mmHg  Pulse 96  Ht 5\' 6"  (1.676 m)  Wt 299 lb 6.4 oz (135.807 kg)  BMI 48.35 kg/m2  LMP 04/09/2014 Last Weight:  Wt Readings from Last 1 Encounters:  05/04/14 299 lb 6.4 oz (135.807 kg)   Last Height:   Ht Readings from Last 1 Encounters:  05/04/14 5\' 6"  (1.676 m)    Physical exam: Exam: Gen: NAD, , morbidly obese, unkempt                   CV: RRR, no MRG. No Carotid Bruits.  Eyes: Conjunctivae clear without exudates or hemorrhage  Neuro: Detailed Neurologic Exam  Speech:    Speech is normal; fluent and spontaneous with normal comprehension.  Cognition:    The patient is oriented to person, place, and time;     recent and remote memory intact;     language fluent;     normal attention, concentration,     fund of knowledge Cranial Nerves:    The pupils are equal, round, and reactive to light. The fundi are flat. Visual fields are full to finger confrontation. Extraocular movements are intact. Trigeminal sensation is intact and the muscles of mastication are normal. The face is symmetric. The palate elevates in  the midline. Voice is normal. Shoulder shrug is normal. The tongue has normal motion without fasciculations.   Coordination:    No dysmetria  Gait:    Antalgic, using a cane. Right leg boot  Motor Observation:    Left flattening thenar eminence Tone:    Normal muscle tone.  Posture:    Posture is normal. normal erect    Strength:    Diffuse left arm weakness 4/5 with give-way, weakness most pronounced left oppnens.      Sensation: Decreased pp in the left digits 1-2 and palm/hypothenar areas     Reflex Exam:  DTR's:    Deep tendon reflexes in the upper and lower extremities are hyporeflexic bilaterally may be secondary to large body habitus Toes:    Left downgoing without clonus     Assessment/Plan:   Diana Henderson is a 43 y.o. female here as a referral from Dr. Vista Lawman for left hand pain. She also has OSA, uncontrolled DM2, HTN, COPD, Morbid obesity, diabetic neuropathy.  She has previous CTS release on the left hand. Now with 3 weeks of worsening numbness in the digits 1-2 of the left hand. Throbbing and weakness, dropping everything.   CTS: Will eval with EMG/NCS of left arm. Conservative measures, wrist brace.  OSA: Continue to use CPAP, follow with sleep doctor regularly Neuropathy: Tight control of diabetes and other vascular risk factors Stroke prevention:  Tight control of diabetes and other vascular risk factors  Sarina Ill, MD  [facility] [address] [place], [place] [address]  Phone [phone] Fax [phone]

## 2014-05-04 NOTE — Patient Instructions (Signed)
Overall you are doing fairly well but I do want to suggest a few things today:   Remember to drink plenty of fluid, eat healthy meals and do not skip any meals. Try to eat protein with a every meal and eat a healthy snack such as fruit or nuts in between meals. Try to keep a regular sleep-wake schedule and try to exercise daily, particularly in the form of walking, 20-30 minutes a day, if you can.   I would like to suggest: wear a left wrist brace to help keep wrist straight during the day and when sleeping  As far as diagnostic testing: EMG/NCS  I would like to see you back within one week for emg/ncs, sooner if we need to. Please call us with any interim questions, concerns, problems, updates or refill requests.   Please also call us for any test results so we can go over those with you on the phone.  My clinical assistant and will answer any of your questions and relay your messages to me and also relay most of my messages to you.   Our phone number is [phone]. We also have an after hours call service for urgent matters and there is a physician on-call for urgent questions. For any emergencies you know to call 911 or go to the nearest emergency room

## 2014-05-13 ENCOUNTER — Encounter (HOSPITAL_COMMUNITY): Payer: Self-pay | Admitting: Emergency Medicine

## 2014-05-13 ENCOUNTER — Emergency Department (HOSPITAL_COMMUNITY)
Admission: EM | Admit: 2014-05-13 | Discharge: 2014-05-13 | Disposition: A | Discharge status: Home / Self Care | Payer: Medicaid Other | Attending: Emergency Medicine | Admitting: Emergency Medicine

## 2014-05-13 DIAGNOSIS — Z794 Long term (current) use of insulin: Secondary | ICD-10-CM | POA: Diagnosis not present

## 2014-05-13 DIAGNOSIS — I10 Essential (primary) hypertension: Secondary | ICD-10-CM | POA: Diagnosis not present

## 2014-05-13 DIAGNOSIS — Z8669 Personal history of other diseases of the nervous system and sense organs: Secondary | ICD-10-CM | POA: Insufficient documentation

## 2014-05-13 DIAGNOSIS — F329 Major depressive disorder, single episode, unspecified: Secondary | ICD-10-CM | POA: Insufficient documentation

## 2014-05-13 DIAGNOSIS — Z91040 Latex allergy status: Secondary | ICD-10-CM | POA: Insufficient documentation

## 2014-05-13 DIAGNOSIS — Z87891 Personal history of nicotine dependence: Secondary | ICD-10-CM | POA: Diagnosis not present

## 2014-05-13 DIAGNOSIS — F4310 Post-traumatic stress disorder, unspecified: Secondary | ICD-10-CM | POA: Diagnosis not present

## 2014-05-13 DIAGNOSIS — J45909 Unspecified asthma, uncomplicated: Secondary | ICD-10-CM | POA: Diagnosis not present

## 2014-05-13 DIAGNOSIS — I252 Old myocardial infarction: Secondary | ICD-10-CM | POA: Diagnosis not present

## 2014-05-13 DIAGNOSIS — M25561 Pain in right knee: Secondary | ICD-10-CM | POA: Insufficient documentation

## 2014-05-13 DIAGNOSIS — F419 Anxiety disorder, unspecified: Secondary | ICD-10-CM | POA: Insufficient documentation

## 2014-05-13 DIAGNOSIS — E119 Type 2 diabetes mellitus without complications: Secondary | ICD-10-CM | POA: Diagnosis not present

## 2014-05-13 DIAGNOSIS — G8929 Other chronic pain: Secondary | ICD-10-CM

## 2014-05-13 DIAGNOSIS — Z79899 Other long term (current) drug therapy: Secondary | ICD-10-CM | POA: Insufficient documentation

## 2014-05-13 MED ORDER — MORPHINE SULFATE 4 MG/ML IJ SOLN
4.0000 mg | Freq: Once | INTRAMUSCULAR | Status: AC
  Administered 2014-05-13: 4 mg via INTRAMUSCULAR
  Filled 2014-05-13: qty 1

## 2014-05-13 MED ORDER — CYCLOBENZAPRINE HCL 10 MG PO TABS: 10.0000 mg | ORAL_TABLET | Freq: Two times a day (BID) | ORAL | Status: DC | PRN

## 2014-05-13 MED ORDER — CYCLOBENZAPRINE HCL 10 MG PO TABS
5.0000 mg | ORAL_TABLET | Freq: Once | ORAL | Status: AC
  Administered 2014-05-13: 5 mg via ORAL
  Filled 2014-05-13: qty 1

## 2014-05-13 NOTE — ED Notes (Signed)
Family at bedside. 

## 2014-05-13 NOTE — ED Provider Notes (Signed)
CSN: [id number]     Arrival date & time 05/13/14  0865 History   First MD Initiated Contact with Patient 05/13/14 [phone]     Chief Complaint  Patient presents with  . Knee Pain    The patient is having right knee pain.  The patient said it has been hurting for about two or three months and today she cannot stand the pain.     (Consider location/radiation/quality/duration/timing/severity/associated sxs/prior Treatment) HPI  Diana Henderson is a 43 y.o. female complaining of atraumatic exacerbation of chronic right lower extremity pain. Patient normally ambulatory with a cane but she states the pain is so severe she cannot ambulate today. No pain medication taken prior to arrival. She has a history of remote right foot surgery. She denies any current trauma, states that right lower extremity is swollen but states that this is chronic for her. Patient was seen a month ago and had negative x-rays and DVT study at that time.  Past Medical History  Diagnosis Date  . Diabetes mellitus   . Hypertension   . Myocardial infarction   . Asthma   . Hyperlipidemia   . Sleep apnea   . Depression   . PTSD (post-traumatic stress disorder)   . Pain in joint, ankle and foot 02/17/2013  . Anxiety   . Bipolar 1 disorder    Past Surgical History  Procedure Laterality Date  . Tubal ligation    . Midtarsal arthrodesis Right 07/17/12  . Remove implant deep Right 07/17/12  . Lapidus fusion Right 01/10/12  . Carpal tunnel release Left 2001    Left wrist  . Gastroc recession extremity Right 3//20/14    Right foot  . Hernia repair      1996   Family History  Problem Relation Age of Onset  . Heart attack Mother   . Kidney disease Father    History  Substance Use Topics  . Smoking status: Former Smoker -- 0.50 packs/day    Types: Cigarettes    Quit date: 07/03/2013  . Smokeless tobacco: Never Used  . Alcohol Use: No   OB History    No data available     Review of Systems  10 systems  reviewed and found to be negative, except as noted in the HPI.  Allergies  Celery oil; Pork-derived products; and Latex  Home Medications   Prior to Admission medications   Medication Sig Start Date End Date Taking? Authorizing Provider  albuterol (PROVENTIL HFA;VENTOLIN HFA) 108 (90 BASE) MCG/ACT inhaler Inhale 2 puffs into the lungs every 4 (four) hours as needed for wheezing or shortness of breath.   Yes Historical Provider, MD  amLODipine (NORVASC) 10 MG tablet Take 10 mg by mouth daily.   Yes Historical Provider, MD  atenolol (TENORMIN) 50 MG tablet Take 50 mg by mouth daily.   Yes Historical Provider, MD  Canagliflozin (INVOKANA) 300 MG TABS Take 300 mg by mouth daily.   Yes Historical Provider, MD  DULoxetine (CYMBALTA) 60 MG capsule Take 60 mg by mouth daily.   Yes Historical Provider, MD  hydrOXYzine (ATARAX/VISTARIL) 50 MG tablet Take 50 mg by mouth 3 (three) times daily as needed for anxiety.    Yes Historical Provider, MD  insulin NPH-regular Human (NOVOLIN 70/30) (70-30) 100 UNIT/ML injection Inject 30 Units into the skin 2 (two) times daily with a meal.   Yes Historical Provider, MD  ketoprofen (ORUDIS) 75 MG capsule Take 75 mg by mouth 3 (three) times daily as needed for pain (  pain).   Yes Historical Provider, MD  lisinopril (PRINIVIL,ZESTRIL) 20 MG tablet Take 20 mg by mouth 2 (two) times daily.    Yes Historical Provider, MD  LORazepam (ATIVAN) 1 MG tablet Take 1 mg by mouth 3 (three) times daily as needed for anxiety.   Yes Historical Provider, MD  omeprazole (PRILOSEC) 20 MG capsule Take 20 mg by mouth daily.   Yes Historical Provider, MD  ondansetron (ZOFRAN ODT) 8 MG disintegrating tablet Take 1 tablet (8 mg total) by mouth every 8 (eight) hours as needed for nausea or vomiting. 01/28/14  Yes Ephraim Hamburger, MD  orphenadrine (NORFLEX) 100 MG tablet Take 100 mg by mouth 2 (two) times daily as needed for muscle spasms.   Yes Historical Provider, MD  oxyCODONE-acetaminophen  (PERCOCET) 10-325 MG per tablet Take 1 tablet by mouth every 6 (six) hours as needed for pain.  03/08/14  Yes Historical Provider, MD  risperiDONE (RISPERDAL) 2 MG tablet Take 2 mg by mouth at bedtime.   Yes Historical Provider, MD  sucralfate (CARAFATE) 1 G tablet Take 1 g by mouth daily.   Yes Historical Provider, MD  tiotropium (SPIRIVA) 18 MCG inhalation capsule Place 18 mcg into inhaler and inhale daily.   Yes Historical Provider, MD  trimethoprim-polymyxin b (POLYTRIM) ophthalmic solution Place 1 drop into the left eye every 4 (four) hours. 02/19/14  Yes Donne Hazel, MD  cyclobenzaprine (FLEXERIL) 10 MG tablet Take 1 tablet (10 mg total) by mouth 2 (two) times daily as needed for muscle spasms. 05/13/14   Jamariah Tony, PA-C  dextromethorphan (DELSYM) 30 MG/5ML liquid Take 2.5 mLs (15 mg total) by mouth at bedtime as needed for cough. Patient not taking: Reported on 05/13/2014 02/19/14   Lacy Duverney, PA-C   BP 164/92 mmHg  Pulse 84  Temp(Src) 97.6 F (36.4 C) (Oral)  Resp 22  SpO2 97%  LMP 05/05/2014 Physical Exam  Constitutional: She is oriented to person, place, and time. She appears well-developed and well-nourished. No distress.  Obese   HENT:  Head: Normocephalic.  Mouth/Throat: Oropharynx is clear and moist.  Eyes: Conjunctivae and EOM are normal. Pupils are equal, round, and reactive to light.  Neck: Normal range of motion.  Cardiovascular: Normal rate, regular rhythm and intact distal pulses.   Pulmonary/Chest: Effort normal and breath sounds normal. No stridor. No respiratory distress. She has no wheezes. She has no rales. She exhibits no tenderness.  Abdominal: Soft. Bowel sounds are normal. She exhibits no distension and no mass. There is no tenderness. There is no rebound and no guarding.  Musculoskeletal: Normal range of motion. She exhibits tenderness.  Right knee:  No deformity, erythema or abrasions. FROM. No effusion ++ Crepitance. Anterior and posterior drawer  show no abnormal laxity. Stable to valgus and varus stress. Joint lines are non-tender. Neurovascularly intact.   RLE with very slight increased swelling compared to LLE, Homans sign negative. No superficial collaterals or palpable cords    Neurological: She is alert and oriented to person, place, and time.  Psychiatric: She has a normal mood and affect.  Nursing note and vitals reviewed.   ED Course  Procedures (including critical care time) Labs Review Labs Reviewed - No data to display  Imaging Review No results found.   EKG Interpretation None      MDM   Final diagnoses:  Knee pain, chronic, right   Filed Vitals:   05/13/14 0943 05/13/14 1107  BP: 177/87 164/92  Pulse: 88 84  Temp:  97.6 F (36.4 C)   TempSrc: Oral   Resp: 20 22  SpO2: 100% 97%    Medications  morphine 4 MG/ML injection 4 mg (4 mg Intramuscular Given 05/13/14 1035)  cyclobenzaprine (FLEXERIL) tablet 5 mg (5 mg Oral Given 05/13/14 1035)    Lorissa Kishbaugh is a 43 y.o. female presenting with exacerbation of RLE pain. Pt has remote right foot surgery ambulates with cane normally, has not followed with ortho. Doubt DVT. No pain medws PTA. NVI Pt pain improved in ED and she ambulated without issue.   Evaluation does not show pathology that would require ongoing emergent intervention or inpatient treatment. Pt is hemodynamically stable and mentating appropriately. Discussed findings and plan with patient/guardian, who agrees with care plan. All questions answered. Return precautions discussed and outpatient follow up given.   New Prescriptions   CYCLOBENZAPRINE (FLEXERIL) 10 MG TABLET    Take 1 tablet (10 mg total) by mouth 2 (two) times daily as needed for muscle spasms.         Monico Blitz, PA-C 05/13/14 [place] Ray, MD 05/14/14 [phone]

## 2014-05-13 NOTE — ED Notes (Signed)
Pt ambulated in hallway with cane.

## 2014-05-13 NOTE — Discharge Instructions (Signed)
For pain control you may take up to 800mg of ibuprofen (that is usually 4 over the counter pills)  3 times a day (take with food) and acetaminophen 975mg (this is 3 over the counter pills) four times a day. Do not drink alcohol or combine with other medications that have acetaminophen as an ingredient (Read the labels!).  ° ° For breakthrough pain you may take Flexeril. Do not drink alcohol, drive or operate heavy machinery when taking Flexeril. ° °Please follow with your primary care doctor in the next 2 days for a check-up. They must obtain records for further management.  ° °Do not hesitate to return to the Emergency Department for any new, worsening or concerning symptoms.  ° ° °

## 2014-05-13 NOTE — ED Notes (Signed)
The patient is having right knee pain.  The patient said it has been hurting for about two or three months and today she cannot stand the pain.  She rates the pain 10/10.  Her pain is so severe she cannot even walk.

## 2014-05-16 ENCOUNTER — Inpatient Hospital Stay (HOSPITAL_COMMUNITY)
Admission: EM | Admit: 2014-05-16 | Discharge: 2014-05-17 | DRG: 202 | Disposition: A | Discharge status: Home / Self Care | Payer: Medicaid Other | Attending: Internal Medicine | Admitting: Internal Medicine

## 2014-05-16 ENCOUNTER — Encounter (HOSPITAL_COMMUNITY): Payer: Self-pay

## 2014-05-16 ENCOUNTER — Emergency Department (HOSPITAL_COMMUNITY): Payer: Medicaid Other

## 2014-05-16 DIAGNOSIS — G4733 Obstructive sleep apnea (adult) (pediatric): Secondary | ICD-10-CM | POA: Diagnosis present

## 2014-05-16 DIAGNOSIS — R0603 Acute respiratory distress: Secondary | ICD-10-CM | POA: Diagnosis present

## 2014-05-16 DIAGNOSIS — F329 Major depressive disorder, single episode, unspecified: Secondary | ICD-10-CM | POA: Diagnosis present

## 2014-05-16 DIAGNOSIS — I10 Essential (primary) hypertension: Secondary | ICD-10-CM | POA: Diagnosis present

## 2014-05-16 DIAGNOSIS — E785 Hyperlipidemia, unspecified: Secondary | ICD-10-CM | POA: Diagnosis present

## 2014-05-16 DIAGNOSIS — Z79899 Other long term (current) drug therapy: Secondary | ICD-10-CM

## 2014-05-16 DIAGNOSIS — J45901 Unspecified asthma with (acute) exacerbation: Principal | ICD-10-CM | POA: Diagnosis present

## 2014-05-16 DIAGNOSIS — Z794 Long term (current) use of insulin: Secondary | ICD-10-CM

## 2014-05-16 DIAGNOSIS — R0600 Dyspnea, unspecified: Secondary | ICD-10-CM

## 2014-05-16 DIAGNOSIS — Z6841 Body Mass Index (BMI) 40.0 and over, adult: Secondary | ICD-10-CM

## 2014-05-16 DIAGNOSIS — Z87891 Personal history of nicotine dependence: Secondary | ICD-10-CM

## 2014-05-16 DIAGNOSIS — R0602 Shortness of breath: Secondary | ICD-10-CM

## 2014-05-16 DIAGNOSIS — I252 Old myocardial infarction: Secondary | ICD-10-CM

## 2014-05-16 DIAGNOSIS — E119 Type 2 diabetes mellitus without complications: Secondary | ICD-10-CM

## 2014-05-16 DIAGNOSIS — F419 Anxiety disorder, unspecified: Secondary | ICD-10-CM | POA: Diagnosis present

## 2014-05-16 DIAGNOSIS — F319 Bipolar disorder, unspecified: Secondary | ICD-10-CM | POA: Diagnosis present

## 2014-05-16 DIAGNOSIS — I519 Heart disease, unspecified: Secondary | ICD-10-CM

## 2014-05-16 LAB — CBC
HCT: 43.6 % (ref 36.0–46.0)
HEMOGLOBIN: 14.4 g/dL (ref 12.0–15.0)
MCH: 28.6 pg (ref 26.0–34.0)
MCHC: 33 g/dL (ref 30.0–36.0)
MCV: 86.7 fL (ref 78.0–100.0)
Platelets: 188 10*3/uL (ref 150–400)
RBC: 5.03 MIL/uL (ref 3.87–5.11)
RDW: 12.7 % (ref 11.5–15.5)
WBC: 6 10*3/uL (ref 4.0–10.5)

## 2014-05-16 LAB — GLUCOSE, CAPILLARY
GLUCOSE-CAPILLARY: 370 mg/dL — AB (ref 70–99)
Glucose-Capillary: 296 mg/dL — ABNORMAL HIGH (ref 70–99)

## 2014-05-16 LAB — BASIC METABOLIC PANEL
Anion gap: 15 (ref 5–15)
BUN: 7 mg/dL (ref 6–23)
CHLORIDE: 97 meq/L (ref 96–112)
CO2: 22 mEq/L (ref 19–32)
Calcium: 9 mg/dL (ref 8.4–10.5)
Creatinine, Ser: 0.85 mg/dL (ref 0.50–1.10)
GFR calc Af Amer: >90 mL/min (ref >90)
GFR, EST NON AFRICAN AMERICAN: 82 mL/min — AB (ref >90)
Glucose, Bld: 185 mg/dL — ABNORMAL HIGH (ref 70–99)
Potassium: 4.5 mEq/L (ref 3.7–5.3)
SODIUM: 134 meq/L — AB (ref 137–147)

## 2014-05-16 LAB — I-STAT TROPONIN, ED: TROPONIN I, POC: 0 ng/mL (ref 0.00–0.08)

## 2014-05-16 LAB — PROTIME-INR
INR: 0.99 (ref 0.00–1.49)
Prothrombin Time: 13.1 seconds (ref 11.6–15.2)

## 2014-05-16 LAB — HEMOGLOBIN A1C
HEMOGLOBIN A1C: 7.9 % — AB (ref <5.7)
MEAN PLASMA GLUCOSE: 180 mg/dL — AB (ref <117)

## 2014-05-16 LAB — D-DIMER, QUANTITATIVE: D-Dimer, Quant: 0.48 ug/mL-FEU (ref 0.00–0.48)

## 2014-05-16 LAB — TSH: TSH: 0.529 u[IU]/mL (ref 0.350–4.500)

## 2014-05-16 MED ORDER — RISPERIDONE 2 MG PO TABS
2.0000 mg | ORAL_TABLET | Freq: Every day | ORAL | Status: DC
  Administered 2014-05-16: 2 mg via ORAL
  Filled 2014-05-16 (×3): qty 1

## 2014-05-16 MED ORDER — DULOXETINE HCL 60 MG PO CPEP
60.0000 mg | ORAL_CAPSULE | Freq: Every day | ORAL | Status: DC
  Administered 2014-05-17: 60 mg via ORAL
  Filled 2014-05-16 (×2): qty 1

## 2014-05-16 MED ORDER — FUROSEMIDE 10 MG/ML IJ SOLN
40.0000 mg | Freq: Once | INTRAMUSCULAR | Status: AC
  Administered 2014-05-16: 40 mg via INTRAVENOUS
  Filled 2014-05-16: qty 4

## 2014-05-16 MED ORDER — ACETAMINOPHEN 325 MG PO TABS: 650.0000 mg | ORAL_TABLET | Freq: Four times a day (QID) | ORAL | Status: DC | PRN

## 2014-05-16 MED ORDER — AMLODIPINE BESYLATE 10 MG PO TABS
10.0000 mg | ORAL_TABLET | Freq: Every day | ORAL | Status: DC
  Administered 2014-05-17: 10 mg via ORAL
  Filled 2014-05-16 (×2): qty 1

## 2014-05-16 MED ORDER — MORPHINE SULFATE 2 MG/ML IJ SOLN
1.0000 mg | INTRAMUSCULAR | Status: DC | PRN
  Administered 2014-05-16: 1 mg via INTRAVENOUS
  Filled 2014-05-16: qty 1

## 2014-05-16 MED ORDER — ACETAMINOPHEN 650 MG RE SUPP: 650.0000 mg | Freq: Four times a day (QID) | RECTAL | Status: DC | PRN

## 2014-05-16 MED ORDER — ONDANSETRON HCL 4 MG/2ML IJ SOLN
4.0000 mg | Freq: Once | INTRAMUSCULAR | Status: AC
Start: 2014-05-16 — End: 2014-05-16
  Administered 2014-05-16: 4 mg via INTRAVENOUS
  Filled 2014-05-16: qty 2

## 2014-05-16 MED ORDER — METHYLPREDNISOLONE SODIUM SUCC 125 MG IJ SOLR
125.0000 mg | Freq: Once | INTRAMUSCULAR | Status: AC
  Administered 2014-05-16: 125 mg via INTRAVENOUS
  Filled 2014-05-16: qty 2

## 2014-05-16 MED ORDER — ALBUTEROL (5 MG/ML) CONTINUOUS INHALATION SOLN
10.0000 mg/h | INHALATION_SOLUTION | RESPIRATORY_TRACT | Status: DC
  Administered 2014-05-16: 10 mg/h via RESPIRATORY_TRACT
  Filled 2014-05-16: qty 20

## 2014-05-16 MED ORDER — INSULIN ASPART 100 UNIT/ML ~~LOC~~ SOLN
10.0000 [IU] | Freq: Once | SUBCUTANEOUS | Status: AC
  Administered 2014-05-16: 10 [IU] via SUBCUTANEOUS

## 2014-05-16 MED ORDER — METHYLPREDNISOLONE SODIUM SUCC 125 MG IJ SOLR
80.0000 mg | Freq: Three times a day (TID) | INTRAMUSCULAR | Status: DC
  Administered 2014-05-16 – 2014-05-17 (×2): 80 mg via INTRAVENOUS
  Filled 2014-05-16 (×4): qty 1.28
  Filled 2014-05-16: qty 2

## 2014-05-16 MED ORDER — HEPARIN SODIUM (PORCINE) 5000 UNIT/ML IJ SOLN
5000.0000 [IU] | Freq: Three times a day (TID) | INTRAMUSCULAR | Status: DC
  Administered 2014-05-16 – 2014-05-17 (×4): 5000 [IU] via SUBCUTANEOUS
  Filled 2014-05-16 (×6): qty 1

## 2014-05-16 MED ORDER — IPRATROPIUM-ALBUTEROL 0.5-2.5 (3) MG/3ML IN SOLN
3.0000 mL | Freq: Once | RESPIRATORY_TRACT | Status: AC
  Administered 2014-05-16: 3 mL via RESPIRATORY_TRACT
  Filled 2014-05-16: qty 3

## 2014-05-16 MED ORDER — MAGNESIUM SULFATE 2 GM/50ML IV SOLN
2.0000 g | Freq: Once | INTRAVENOUS | Status: AC
  Administered 2014-05-16: 2 g via INTRAVENOUS
  Filled 2014-05-16: qty 50

## 2014-05-16 MED ORDER — SODIUM CHLORIDE 0.9 % IJ SOLN
3.0000 mL | Freq: Two times a day (BID) | INTRAMUSCULAR | Status: DC
  Administered 2014-05-16 – 2014-05-17 (×3): 3 mL via INTRAVENOUS

## 2014-05-16 MED ORDER — OXYCODONE HCL 5 MG PO TABS
5.0000 mg | ORAL_TABLET | Freq: Four times a day (QID) | ORAL | Status: DC | PRN
Start: 2014-05-16 — End: 2014-05-17
  Administered 2014-05-16: 5 mg via ORAL
  Filled 2014-05-16: qty 1

## 2014-05-16 MED ORDER — POLYMYXIN B-TRIMETHOPRIM 10000-0.1 UNIT/ML-% OP SOLN: 1.0000 [drp] | OPHTHALMIC | Status: DC

## 2014-05-16 MED ORDER — GUAIFENESIN-DM 100-10 MG/5ML PO SYRP
5.0000 mL | ORAL_SOLUTION | ORAL | Status: DC | PRN
  Administered 2014-05-17: 5 mL via ORAL
  Filled 2014-05-16 (×3): qty 5

## 2014-05-16 MED ORDER — OXYCODONE-ACETAMINOPHEN 5-325 MG PO TABS
1.0000 | ORAL_TABLET | Freq: Four times a day (QID) | ORAL | Status: DC | PRN
  Administered 2014-05-16: 1 via ORAL
  Filled 2014-05-16: qty 1

## 2014-05-16 MED ORDER — LISINOPRIL 20 MG PO TABS
20.0000 mg | ORAL_TABLET | Freq: Two times a day (BID) | ORAL | Status: DC
  Administered 2014-05-16 – 2014-05-17 (×2): 20 mg via ORAL
  Filled 2014-05-16 (×4): qty 1

## 2014-05-16 MED ORDER — PANTOPRAZOLE SODIUM 40 MG PO TBEC
40.0000 mg | DELAYED_RELEASE_TABLET | Freq: Every day | ORAL | Status: DC
  Administered 2014-05-17: 40 mg via ORAL
  Filled 2014-05-16: qty 1

## 2014-05-16 MED ORDER — ALBUTEROL SULFATE (2.5 MG/3ML) 0.083% IN NEBU
10.0000 mg | INHALATION_SOLUTION | Freq: Once | RESPIRATORY_TRACT | Status: DC
Start: 2014-05-16 — End: 2014-05-16
  Filled 2014-05-16: qty 12

## 2014-05-16 MED ORDER — LORAZEPAM 1 MG PO TABS
1.0000 mg | ORAL_TABLET | Freq: Three times a day (TID) | ORAL | Status: DC | PRN
  Administered 2014-05-17: 1 mg via ORAL
  Filled 2014-05-16: qty 1

## 2014-05-16 MED ORDER — CANAGLIFLOZIN 300 MG PO TABS
300.0000 mg | ORAL_TABLET | Freq: Every day | ORAL | Status: DC
  Administered 2014-05-17: 300 mg via ORAL
  Filled 2014-05-16 (×2): qty 300

## 2014-05-16 MED ORDER — GUAIFENESIN ER 600 MG PO TB12
1200.0000 mg | ORAL_TABLET | Freq: Two times a day (BID) | ORAL | Status: DC
  Administered 2014-05-16 – 2014-05-17 (×3): 1200 mg via ORAL
  Filled 2014-05-16 (×4): qty 2

## 2014-05-16 MED ORDER — INSULIN ASPART PROT & ASPART (70-30 MIX) 100 UNIT/ML ~~LOC~~ SUSP
25.0000 [IU] | Freq: Two times a day (BID) | SUBCUTANEOUS | Status: DC
  Administered 2014-05-17: 25 [IU] via SUBCUTANEOUS
  Filled 2014-05-16: qty 10

## 2014-05-16 MED ORDER — HYDROXYZINE HCL 25 MG PO TABS: 50.0000 mg | ORAL_TABLET | Freq: Three times a day (TID) | ORAL | Status: DC | PRN

## 2014-05-16 MED ORDER — SUCRALFATE 1 G PO TABS
1.0000 g | ORAL_TABLET | Freq: Every day | ORAL | Status: DC
  Filled 2014-05-16 (×2): qty 1

## 2014-05-16 MED ORDER — IPRATROPIUM-ALBUTEROL 0.5-2.5 (3) MG/3ML IN SOLN
3.0000 mL | RESPIRATORY_TRACT | Status: DC
  Administered 2014-05-16 – 2014-05-17 (×5): 3 mL via RESPIRATORY_TRACT
  Filled 2014-05-16 (×6): qty 3

## 2014-05-16 MED ORDER — TIOTROPIUM BROMIDE MONOHYDRATE 18 MCG IN CAPS: 18.0000 ug | ORAL_CAPSULE | Freq: Every day | RESPIRATORY_TRACT | Status: DC

## 2014-05-16 MED ORDER — OXYCODONE-ACETAMINOPHEN 10-325 MG PO TABS
1.0000 | ORAL_TABLET | Freq: Four times a day (QID) | ORAL | Status: DC | PRN
Start: 2014-05-16 — End: 2014-05-16

## 2014-05-16 MED ORDER — ATENOLOL 50 MG PO TABS
50.0000 mg | ORAL_TABLET | Freq: Every day | ORAL | Status: DC
  Administered 2014-05-17: 50 mg via ORAL
  Filled 2014-05-16 (×2): qty 1

## 2014-05-16 MED ORDER — ONDANSETRON 8 MG PO TBDP
8.0000 mg | ORAL_TABLET | Freq: Three times a day (TID) | ORAL | Status: DC | PRN
  Filled 2014-05-16: qty 1

## 2014-05-16 MED ORDER — INSULIN ASPART 100 UNIT/ML ~~LOC~~ SOLN
0.0000 [IU] | Freq: Three times a day (TID) | SUBCUTANEOUS | Status: DC
  Administered 2014-05-17: 3 [IU] via SUBCUTANEOUS
  Administered 2014-05-17: 8 [IU] via SUBCUTANEOUS

## 2014-05-16 MED ORDER — ALBUTEROL SULFATE (2.5 MG/3ML) 0.083% IN NEBU: 3.0000 mL | INHALATION_SOLUTION | RESPIRATORY_TRACT | Status: DC | PRN

## 2014-05-16 NOTE — ED Notes (Signed)
Pt. Reports having a cough, congestion and sob since Wednesday.  Chest pain when pt.  Coughs and deep inspiration.  Skin is warm and dry.  Audible wheezing noted. ECG completed in Triage.

## 2014-05-16 NOTE — Progress Notes (Signed)
  Echocardiogram 2D Echocardiogram has been performed.  Diana Henderson 05/16/2014, 3:34 PM

## 2014-05-16 NOTE — ED Provider Notes (Signed)
CSN: [id number]     Arrival date & time 05/16/14  1100 History   First MD Initiated Contact with Patient 05/16/14 1120     Chief Complaint  Patient presents with  . Shortness of Breath     HPI Patient reports 4 days of worsening cough and congestion with associated shortness of breath.  She reports chest pain when she coughs.  She denies productive cough.  Denies fevers and chills.  She has a history of asthma.  States she's tried her inhaler without improvement in her symptoms.  Her symptoms are moderate to severe in severity.  She feels short of breath at this time.  At time of my evaluation she just completed 5 mg of albuterol as well as 0.5 mg of Atrovent without much improvement in her symptoms.   Past Medical History  Diagnosis Date  . Diabetes mellitus   . Hypertension   . Myocardial infarction   . Asthma   . Hyperlipidemia   . Sleep apnea   . Depression   . PTSD (post-traumatic stress disorder)   . Pain in joint, ankle and foot 02/17/2013  . Anxiety   . Bipolar 1 disorder    Past Surgical History  Procedure Laterality Date  . Tubal ligation    . Midtarsal arthrodesis Right 07/17/12  . Remove implant deep Right 07/17/12  . Lapidus fusion Right 01/10/12  . Carpal tunnel release Left 2001    Left wrist  . Gastroc recession extremity Right 3//20/14    Right foot  . Hernia repair      1996   Family History  Problem Relation Age of Onset  . Heart attack Mother   . Kidney disease Father    History  Substance Use Topics  . Smoking status: Former Smoker -- 0.50 packs/day    Types: Cigarettes    Quit date: 07/03/2013  . Smokeless tobacco: Never Used  . Alcohol Use: No   OB History    No data available     Review of Systems  All other systems reviewed and are negative.     Allergies  Celery oil; Pork-derived products; and Latex  Home Medications   Prior to Admission medications   Medication Sig Start Date End Date Taking? Authorizing Provider  albuterol  (PROVENTIL HFA;VENTOLIN HFA) 108 (90 BASE) MCG/ACT inhaler Inhale 2 puffs into the lungs every 4 (four) hours as needed for wheezing or shortness of breath.    Historical Provider, MD  amLODipine (NORVASC) 10 MG tablet Take 10 mg by mouth daily.    Historical Provider, MD  atenolol (TENORMIN) 50 MG tablet Take 50 mg by mouth daily.    Historical Provider, MD  Canagliflozin (INVOKANA) 300 MG TABS Take 300 mg by mouth daily.    Historical Provider, MD  cyclobenzaprine (FLEXERIL) 10 MG tablet Take 1 tablet (10 mg total) by mouth 2 (two) times daily as needed for muscle spasms. 05/13/14   Nicole Pisciotta, PA-C  dextromethorphan (DELSYM) 30 MG/5ML liquid Take 2.5 mLs (15 mg total) by mouth at bedtime as needed for cough. Patient not taking: Reported on 05/13/2014 02/19/14   Lacy Duverney, PA-C  DULoxetine (CYMBALTA) 60 MG capsule Take 60 mg by mouth daily.    Historical Provider, MD  hydrOXYzine (ATARAX/VISTARIL) 50 MG tablet Take 50 mg by mouth 3 (three) times daily as needed for anxiety.     Historical Provider, MD  insulin NPH-regular Human (NOVOLIN 70/30) (70-30) 100 UNIT/ML injection Inject 30 Units into the skin 2 (two)  times daily with a meal.    Historical Provider, MD  ketoprofen (ORUDIS) 75 MG capsule Take 75 mg by mouth 3 (three) times daily as needed for pain (pain).    Historical Provider, MD  lisinopril (PRINIVIL,ZESTRIL) 20 MG tablet Take 20 mg by mouth 2 (two) times daily.     Historical Provider, MD  LORazepam (ATIVAN) 1 MG tablet Take 1 mg by mouth 3 (three) times daily as needed for anxiety.    Historical Provider, MD  omeprazole (PRILOSEC) 20 MG capsule Take 20 mg by mouth daily.    Historical Provider, MD  ondansetron (ZOFRAN ODT) 8 MG disintegrating tablet Take 1 tablet (8 mg total) by mouth every 8 (eight) hours as needed for nausea or vomiting. 01/28/14   Ephraim Hamburger, MD  orphenadrine (NORFLEX) 100 MG tablet Take 100 mg by mouth 2 (two) times daily as needed for muscle spasms.     Historical Provider, MD  oxyCODONE-acetaminophen (PERCOCET) 10-325 MG per tablet Take 1 tablet by mouth every 6 (six) hours as needed for pain.  03/08/14   Historical Provider, MD  risperiDONE (RISPERDAL) 2 MG tablet Take 2 mg by mouth at bedtime.    Historical Provider, MD  sucralfate (CARAFATE) 1 G tablet Take 1 g by mouth daily.    Historical Provider, MD  tiotropium (SPIRIVA) 18 MCG inhalation capsule Place 18 mcg into inhaler and inhale daily.    Historical Provider, MD  trimethoprim-polymyxin b (POLYTRIM) ophthalmic solution Place 1 drop into the left eye every 4 (four) hours. 02/19/14   Donne Hazel, MD   BP 155/88 mmHg  Pulse 108  Temp(Src) 98.6 F (37 C) (Oral)  Resp 24  Ht 5\' 6"  (1.676 m)  Wt 289 lb (131.09 kg)  BMI 46.67 kg/m2  SpO2 100%  LMP 05/05/2014 Physical Exam  Constitutional: She is oriented to person, place, and time. She appears well-developed and well-nourished. No distress.  HENT:  Head: Normocephalic and atraumatic.  Eyes: EOM are normal.  Neck: Normal range of motion.  Cardiovascular: Normal rate, regular rhythm and normal heart sounds.   Pulmonary/Chest:  Tachypnea.  Wheezing throughout.  No rhonchi.  No stridor.  Abdominal: Soft. She exhibits no distension. There is no tenderness.  Musculoskeletal: Normal range of motion.  Neurological: She is alert and oriented to person, place, and time.  Skin: Skin is warm and dry.  Psychiatric: She has a normal mood and affect. Judgment normal.  Nursing note and vitals reviewed.   ED Course  Procedures (including critical care time)   Labs Review Labs Reviewed  BASIC METABOLIC PANEL - Abnormal; Notable for the following:    Sodium 134 (*)    Glucose, Bld 185 (*)    GFR calc non Af Amer 82 (*)    All other components within normal limits  CBC  I-STAT TROPOININ, ED    Imaging Review Dg Chest Port 1 View  05/16/2014   CLINICAL DATA:  Shortness of breath.  History of asthma.  EXAM: PORTABLE CHEST - 1 VIEW   COMPARISON:  02/19/2014  FINDINGS: Low lung volumes without focal disease. Heart size is within normal limits based on the low lung volumes. The trachea is midline. No acute bone abnormality.  IMPRESSION: Low lung volumes without focal disease.   Electronically Signed   By: Markus Daft M.D.   On: 05/16/2014 12:56     EKG Interpretation   Date/Time:  Sunday May 16 2014 11:11:42 EST Ventricular Rate:  108 PR Interval:  132 QRS Duration: 72 QT Interval:  350 QTC Calculation: 469 R Axis:   70 Text Interpretation:  Sinus tachycardia Otherwise normal ECG No  significant change was found Confirmed by Jola Critzer  MD, Tenika Keeran ([id number]) on  05/16/2014 11:21:29 AM      MDM   Final diagnoses:  Shortness of breath    Patient has been given albuterol, Atrovent, magnesium, Solu-Medrol.  She continues to have some shortness of breath at this time and doesn't feel much better.  She seems rather bronchospastic.  She'll need admission to hospital for ongoing bronchodilator therapy and evaluation.  Chest x-ray without pneumonia.  Labs without significant abnormalities.  Doubt ACS.  Doubt PE.    Hoy Morn, MD 05/16/14 [phone]

## 2014-05-16 NOTE — H&P (Signed)
[facility] History and Physical  Diana Henderson ALP:[id number] DOB: 1970/11/17 DOA: 05/16/2014  Referring physician: Dr. Venora Maples PCP: Benito Mccreedy, MD   Chief Complaint: Respiratory distress  HPI: Diana Henderson is a 43 y.o. female with past medical history of diabetes, hypertension and asthma as well as morbid obesity. Patient came into the hospital complaining about extreme shortness of breath. Patient said she started to have wheezing, shortness of breath, non-productive cough for the past 4 days, been worsening to the point she decided to come to the ED today. Upon further questioning patient endorses some orthopnea, she has lower extremity edema as well. She denies any fever, denies chills, denies any sick contact. In the ED patient was not hypoxic, respiratory rate went up to 24 and she has some labored breathing. Patient will be admitted for observation for asthma exacerbation.   Review of Systems:  Constitutional: negative for anorexia, fevers and sweats Eyes: negative for irritation, redness and visual disturbance Ears, nose, mouth, throat, and face: negative for earaches, epistaxis, nasal congestion and sore throat Respiratory:Per history of present illness  Cardiovascular: negative for chest pain, dyspnea, lower extremity edema, orthopnea, palpitations and syncope Gastrointestinal: negative for abdominal pain, constipation, diarrhea, melena, nausea and vomiting Genitourinary:negative for dysuria, frequency and hematuria Hematologic/lymphatic: negative for bleeding, easy bruising and lymphadenopathy Musculoskeletal:negative for arthralgias, muscle weakness and stiff joints Neurological: negative for coordination problems, gait problems, headaches and weakness Endocrine: negative for diabetic symptoms including polydipsia, polyuria and weight loss Allergic/Immunologic: negative for anaphylaxis, hay fever and urticaria  Past Medical History  Diagnosis Date  .  Diabetes mellitus   . Hypertension   . Myocardial infarction   . Asthma   . Hyperlipidemia   . Sleep apnea   . Depression   . PTSD (post-traumatic stress disorder)   . Pain in joint, ankle and foot 02/17/2013  . Anxiety   . Bipolar 1 disorder    Past Surgical History  Procedure Laterality Date  . Tubal ligation    . Midtarsal arthrodesis Right 07/17/12  . Remove implant deep Right 07/17/12  . Lapidus fusion Right 01/10/12  . Carpal tunnel release Left 2001    Left wrist  . Gastroc recession extremity Right 3//20/14    Right foot  . Hernia repair      1996   Social History:   reports that she quit smoking about 10 months ago. Her smoking use included Cigarettes. She smoked 0.50 packs per day. She has never used smokeless tobacco. She reports that she does not drink alcohol or use illicit drugs.  Allergies  Allergen Reactions  . Celery Oil Hives  . Pork-Derived Products Nausea And Vomiting    headach  . Latex Itching    Powdered latex gloves.      Family History  Problem Relation Age of Onset  . Heart attack Mother   . Kidney disease Father      Prior to Admission medications   Medication Sig Start Date End Date Taking? Authorizing Provider  albuterol (PROVENTIL HFA;VENTOLIN HFA) 108 (90 BASE) MCG/ACT inhaler Inhale 2 puffs into the lungs every 4 (four) hours as needed for wheezing or shortness of breath.    Historical Provider, MD  amLODipine (NORVASC) 10 MG tablet Take 10 mg by mouth daily.    Historical Provider, MD  atenolol (TENORMIN) 50 MG tablet Take 50 mg by mouth daily.    Historical Provider, MD  Canagliflozin (INVOKANA) 300 MG TABS Take 300 mg by mouth daily.    Historical Provider, MD  cyclobenzaprine (FLEXERIL) 10 MG tablet Take 1 tablet (10 mg total) by mouth 2 (two) times daily as needed for muscle spasms. 05/13/14   Nicole Pisciotta, PA-C  dextromethorphan (DELSYM) 30 MG/5ML liquid Take 2.5 mLs (15 mg total) by mouth at bedtime as needed for cough. Patient  not taking: Reported on 05/13/2014 02/19/14   Lacy Duverney, PA-C  DULoxetine (CYMBALTA) 60 MG capsule Take 60 mg by mouth daily.    Historical Provider, MD  hydrOXYzine (ATARAX/VISTARIL) 50 MG tablet Take 50 mg by mouth 3 (three) times daily as needed for anxiety.     Historical Provider, MD  insulin NPH-regular Human (NOVOLIN 70/30) (70-30) 100 UNIT/ML injection Inject 30 Units into the skin 2 (two) times daily with a meal.    Historical Provider, MD  ketoprofen (ORUDIS) 75 MG capsule Take 75 mg by mouth 3 (three) times daily as needed for pain (pain).    Historical Provider, MD  lisinopril (PRINIVIL,ZESTRIL) 20 MG tablet Take 20 mg by mouth 2 (two) times daily.     Historical Provider, MD  LORazepam (ATIVAN) 1 MG tablet Take 1 mg by mouth 3 (three) times daily as needed for anxiety.    Historical Provider, MD  omeprazole (PRILOSEC) 20 MG capsule Take 20 mg by mouth daily.    Historical Provider, MD  ondansetron (ZOFRAN ODT) 8 MG disintegrating tablet Take 1 tablet (8 mg total) by mouth every 8 (eight) hours as needed for nausea or vomiting. 01/28/14   Ephraim Hamburger, MD  orphenadrine (NORFLEX) 100 MG tablet Take 100 mg by mouth 2 (two) times daily as needed for muscle spasms.    Historical Provider, MD  oxyCODONE-acetaminophen (PERCOCET) 10-325 MG per tablet Take 1 tablet by mouth every 6 (six) hours as needed for pain.  03/08/14   Historical Provider, MD  risperiDONE (RISPERDAL) 2 MG tablet Take 2 mg by mouth at bedtime.    Historical Provider, MD  sucralfate (CARAFATE) 1 G tablet Take 1 g by mouth daily.    Historical Provider, MD  tiotropium (SPIRIVA) 18 MCG inhalation capsule Place 18 mcg into inhaler and inhale daily.    Historical Provider, MD  trimethoprim-polymyxin b (POLYTRIM) ophthalmic solution Place 1 drop into the left eye every 4 (four) hours. 02/19/14   Donne Hazel, MD   Physical Exam: Filed Vitals:   05/16/14 1245  BP: 120/81  Pulse: 102  Temp:   Resp: 21   Constitutional:  Oriented to person, place, and time. Well-developed and well-nourished. Cooperative.  Head: Normocephalic and atraumatic.  Nose: Nose normal.  Mouth/Throat: Uvula is midline, oropharynx is clear and moist and mucous membranes are normal.  Eyes: Conjunctivae and EOM are normal. Pupils are equal, round, and reactive to light.  Neck: Trachea normal and normal range of motion. Neck supple.  Cardiovascular: Normal rate, regular rhythm, S1 normal, S2 normal, normal heart sounds and intact distal pulses.   Pulmonary/Chest: Effort normal and breath sounds normal.  Abdominal: Soft. Bowel sounds are normal. There is no hepatosplenomegaly. There is no tenderness.  Musculoskeletal: Normal range of motion.  Neurological: Alert and oriented to person, place, and time. Has normal strength. No cranial nerve deficit or sensory deficit.  Skin: Skin is warm, dry and intact.  Psychiatric: Has a normal mood and affect. Speech is normal and behavior is normal.   Labs on Admission:  Basic Metabolic Panel:  Recent Labs Lab 05/16/14 1133  NA 134*  K 4.5  CL 97  CO2 22  GLUCOSE 185*  BUN 7  CREATININE 0.85  CALCIUM 9.0   Liver Function Tests: No results for input(s): AST, ALT, ALKPHOS, BILITOT, PROT, ALBUMIN in the last 168 hours. No results for input(s): LIPASE, AMYLASE in the last 168 hours. No results for input(s): AMMONIA in the last 168 hours. CBC:  Recent Labs Lab 05/16/14 1133  WBC 6.0  HGB 14.4  HCT 43.6  MCV 86.7  PLT 188   Cardiac Enzymes: No results for input(s): CKTOTAL, CKMB, CKMBINDEX, TROPONINI in the last 168 hours.  BNP (last 3 results)  Recent Labs  07/07/13 1019 01/13/14 0845  PROBNP 9.6 13.3   CBG: No results for input(s): GLUCAP in the last 168 hours.  Radiological Exams on Admission: Dg Chest Port 1 View  05/16/2014   CLINICAL DATA:  Shortness of breath.  History of asthma.  EXAM: PORTABLE CHEST - 1 VIEW  COMPARISON:  02/19/2014  FINDINGS: Low lung volumes  without focal disease. Heart size is within normal limits based on the low lung volumes. The trachea is midline. No acute bone abnormality.  IMPRESSION: Low lung volumes without focal disease.   Electronically Signed   By: Markus Daft M.D.   On: 05/16/2014 12:56    EKG: Independently reviewed.   Assessment/Plan Principal Problem:   Asthma exacerbation Active Problems:   Essential hypertension   Diabetes mellitus type 2, uncomplicated   OSA (obstructive sleep apnea)   Respiratory distress    Acute asthma exacerbation Presented with SOB, wheezing and cough but no fever or chills. Patient is started on IV steroids, inhaled bronchodilators. Given 1 dose of IV magnesium in the ED, chest x-ray did not show acute abnormalities. Started on supportive management with mucolytics and antitussives.  Respiratory distress Secondary to asthma exacerbation, patient endorses some orthopnea and lower extremity edema. Check BNP, d-dimer, 2-D echocardiogram and give 40 mg of Lasix. Check BMP in a.m.  Diabetes mellitus type 2 Insulin-dependent diabetes, check hemoglobin A1c. Add insulin sliding scale, restarted on her home dose of 70/30 insulin mix.  Hypertension Restarted home medications  OSA CPAP at night. Patient counseled about weight loss.  Code Status: Full code  Family Communication: Plan discussed with the patient in the presence of her daughter at bedside.  Disposition Plan: Observation  Time spent: 70 minutes  [facility] Pager [phone]

## 2014-05-17 DIAGNOSIS — F329 Major depressive disorder, single episode, unspecified: Secondary | ICD-10-CM | POA: Diagnosis present

## 2014-05-17 DIAGNOSIS — Z794 Long term (current) use of insulin: Secondary | ICD-10-CM | POA: Diagnosis not present

## 2014-05-17 DIAGNOSIS — G4733 Obstructive sleep apnea (adult) (pediatric): Secondary | ICD-10-CM | POA: Diagnosis present

## 2014-05-17 DIAGNOSIS — E785 Hyperlipidemia, unspecified: Secondary | ICD-10-CM | POA: Diagnosis present

## 2014-05-17 DIAGNOSIS — J45901 Unspecified asthma with (acute) exacerbation: Secondary | ICD-10-CM | POA: Diagnosis present

## 2014-05-17 DIAGNOSIS — Z79899 Other long term (current) drug therapy: Secondary | ICD-10-CM | POA: Diagnosis not present

## 2014-05-17 DIAGNOSIS — Z87891 Personal history of nicotine dependence: Secondary | ICD-10-CM | POA: Diagnosis not present

## 2014-05-17 DIAGNOSIS — F419 Anxiety disorder, unspecified: Secondary | ICD-10-CM | POA: Diagnosis present

## 2014-05-17 DIAGNOSIS — R0602 Shortness of breath: Secondary | ICD-10-CM | POA: Diagnosis present

## 2014-05-17 DIAGNOSIS — Z6841 Body Mass Index (BMI) 40.0 and over, adult: Secondary | ICD-10-CM | POA: Diagnosis not present

## 2014-05-17 DIAGNOSIS — E119 Type 2 diabetes mellitus without complications: Secondary | ICD-10-CM | POA: Diagnosis present

## 2014-05-17 DIAGNOSIS — F319 Bipolar disorder, unspecified: Secondary | ICD-10-CM | POA: Diagnosis present

## 2014-05-17 DIAGNOSIS — I10 Essential (primary) hypertension: Secondary | ICD-10-CM | POA: Diagnosis present

## 2014-05-17 DIAGNOSIS — I252 Old myocardial infarction: Secondary | ICD-10-CM | POA: Diagnosis not present

## 2014-05-17 LAB — PRO B NATRIURETIC PEPTIDE: Pro B Natriuretic peptide (BNP): 24.3 pg/mL (ref 0–125)

## 2014-05-17 LAB — BASIC METABOLIC PANEL
ANION GAP: 16 — AB (ref 5–15)
BUN: 13 mg/dL (ref 6–23)
CALCIUM: 8.9 mg/dL (ref 8.4–10.5)
CHLORIDE: 97 meq/L (ref 96–112)
CO2: 22 mEq/L (ref 19–32)
Creatinine, Ser: 1.13 mg/dL — ABNORMAL HIGH (ref 0.50–1.10)
GFR calc Af Amer: 67 mL/min — ABNORMAL LOW (ref >90)
GFR calc non Af Amer: 58 mL/min — ABNORMAL LOW (ref >90)
Glucose, Bld: 254 mg/dL — ABNORMAL HIGH (ref 70–99)
Potassium: 4.5 mEq/L (ref 3.7–5.3)
Sodium: 135 mEq/L — ABNORMAL LOW (ref 137–147)

## 2014-05-17 LAB — CBC
HCT: 43.4 % (ref 36.0–46.0)
HEMOGLOBIN: 14.3 g/dL (ref 12.0–15.0)
MCH: 28.6 pg (ref 26.0–34.0)
MCHC: 32.9 g/dL (ref 30.0–36.0)
MCV: 86.8 fL (ref 78.0–100.0)
Platelets: 180 10*3/uL (ref 150–400)
RBC: 5 MIL/uL (ref 3.87–5.11)
RDW: 13 % (ref 11.5–15.5)
WBC: 9.2 10*3/uL (ref 4.0–10.5)

## 2014-05-17 LAB — MAGNESIUM: MAGNESIUM: 2.1 mg/dL (ref 1.5–2.5)

## 2014-05-17 LAB — GLUCOSE, CAPILLARY
Glucose-Capillary: 262 mg/dL — ABNORMAL HIGH (ref 70–99)
Glucose-Capillary: 296 mg/dL — ABNORMAL HIGH (ref 70–99)
Glucose-Capillary: 191 mg/dL — ABNORMAL HIGH (ref 70–99)

## 2014-05-17 MED ORDER — FLUTICASONE-SALMETEROL 100-50 MCG/DOSE IN AEPB: 1.0000 | INHALATION_SPRAY | Freq: Two times a day (BID) | RESPIRATORY_TRACT | Status: DC

## 2014-05-17 MED ORDER — METHYLPREDNISOLONE SODIUM SUCC 40 MG IJ SOLR
40.0000 mg | Freq: Three times a day (TID) | INTRAMUSCULAR | Status: DC
  Administered 2014-05-17: 40 mg via INTRAVENOUS
  Filled 2014-05-17 (×3): qty 1

## 2014-05-17 MED ORDER — PREDNISONE 10 MG PO TABS: ORAL_TABLET | ORAL | Status: DC

## 2014-05-17 MED ORDER — TIOTROPIUM BROMIDE MONOHYDRATE 18 MCG IN CAPS: 18.0000 ug | ORAL_CAPSULE | Freq: Every day | RESPIRATORY_TRACT | Status: DC

## 2014-05-17 MED ORDER — PNEUMOCOCCAL VAC POLYVALENT 25 MCG/0.5ML IJ INJ: 0.5000 mL | INJECTION | INTRAMUSCULAR | Status: DC

## 2014-05-17 MED ORDER — PNEUMOCOCCAL VAC POLYVALENT 25 MCG/0.5ML IJ INJ
0.5000 mL | INJECTION | INTRAMUSCULAR | Status: AC
  Administered 2014-05-17: 0.5 mL via INTRAMUSCULAR
  Filled 2014-05-17: qty 0.5

## 2014-05-17 NOTE — Progress Notes (Signed)
Diana Henderson to be D/C'd Home per MD order.  Discussed with the patient and all questions fully answered.    Medication List    TAKE these medications        albuterol 108 (90 BASE) MCG/ACT inhaler  Commonly known as:  PROVENTIL HFA;VENTOLIN HFA  Inhale 2 puffs into the lungs every 4 (four) hours as needed for wheezing or shortness of breath.     amLODipine 10 MG tablet  Commonly known as:  NORVASC  Take 10 mg by mouth daily.     atenolol 50 MG tablet  Commonly known as:  TENORMIN  Take 50 mg by mouth daily.     cyclobenzaprine 10 MG tablet  Commonly known as:  FLEXERIL  Take 1 tablet (10 mg total) by mouth 2 (two) times daily as needed for muscle spasms.     DULoxetine 60 MG capsule  Commonly known as:  CYMBALTA  Take 60 mg by mouth daily.     Fluticasone-Salmeterol 100-50 MCG/DOSE Aepb  Commonly known as:  ADVAIR DISKUS  Inhale 1 puff into the lungs 2 (two) times daily.     hydrOXYzine 50 MG tablet  Commonly known as:  ATARAX/VISTARIL  Take 50 mg by mouth 3 (three) times daily as needed for anxiety.     insulin NPH-regular Human (70-30) 100 UNIT/ML injection  Commonly known as:  NOVOLIN 70/30  Inject 30 Units into the skin 2 (two) times daily with a meal.     INVOKANA 300 MG Tabs tablet  Generic drug:  canagliflozin  Take 300 mg by mouth daily.     ketoprofen 75 MG capsule  Commonly known as:  ORUDIS  Take 75 mg by mouth 3 (three) times daily as needed for pain (pain).     lisinopril 20 MG tablet  Commonly known as:  PRINIVIL,ZESTRIL  Take 20 mg by mouth 2 (two) times daily.     LORazepam 1 MG tablet  Commonly known as:  ATIVAN  Take 1 mg by mouth 3 (three) times daily as needed for anxiety.     omeprazole 20 MG capsule  Commonly known as:  PRILOSEC  Take 20 mg by mouth daily.     ondansetron 8 MG disintegrating tablet  Commonly known as:  ZOFRAN ODT  Take 1 tablet (8 mg total) by mouth every 8 (eight) hours as needed for nausea or vomiting.     orphenadrine 100 MG tablet  Commonly known as:  NORFLEX  Take 100 mg by mouth 2 (two) times daily as needed for muscle spasms.     oxyCODONE-acetaminophen 10-325 MG per tablet  Commonly known as:  PERCOCET  Take 1 tablet by mouth every 6 (six) hours as needed for pain.     predniSONE 10 MG tablet  Commonly known as:  DELTASONE  Take 40 mg tomorrow, 30 mg the next day, 20 mg the next day and 10 mg on the last day     risperiDONE 2 MG tablet  Commonly known as:  RISPERDAL  Take 2 mg by mouth at bedtime.     sucralfate 1 G tablet  Commonly known as:  CARAFATE  Take 1 g by mouth daily.     tiotropium 18 MCG inhalation capsule  Commonly known as:  SPIRIVA  Place 1 capsule (18 mcg total) into inhaler and inhale daily.     trimethoprim-polymyxin b ophthalmic solution  Commonly known as:  POLYTRIM  Place 1 drop into the left eye every 4 (four) hours.  VVS, Skin clean, dry and intact without evidence of skin break down, no evidence of skin tears noted. IV catheter discontinued intact. Site without signs and symptoms of complications. Dressing and pressure applied.  An After Visit Summary was printed and given to the patient.  D/c education completed with patient/family including follow up instructions, medication list, d/c activities limitations if indicated, with other d/c instructions as indicated by MD - patient able to verbalize understanding, all questions fully answered.   Patient instructed to return to ED, call 911, or call MD for any changes in condition.   Patient escorted via [place], and D/C home via private auto.  Diana Henderson 05/17/2014 3:57 PM

## 2014-05-17 NOTE — Progress Notes (Signed)
UR completed 

## 2014-05-17 NOTE — Discharge Summary (Signed)
Physician Discharge Summary  Diana Henderson OMB:[id number] DOB: December 04, 1970 DOA: 05/16/2014  PCP: Benito Mccreedy, MD  Admit date: 05/16/2014 Discharge date: 05/17/2014  Time spent: >45 minutes  Recommendations for Outpatient Follow-up:  1. See PCP in 1 week   Discharge Condition: stable Diet recommendation: diabetic diet, heart healthy  Discharge Diagnoses:  Principal Problem:   Asthma exacerbation Active Problems:   Essential hypertension   Diabetes mellitus type 2, uncomplicated   OSA (obstructive sleep apnea)   Respiratory distress   History of present illness:  43 year old female with a history of diabetes, hypertension, asthma and morbid obesity who presented with an aggressive shortness of breath, wheezing and a nonproductive cough over the past 3-4 days and was admitted for an asthma exacerbation.  Hospital Course:  Asthma exacerbation -The patient states that she has been using her albuterol inhaler at least 3 times every day for the past 3-4 weeks. She saw her PCP in regards to this and states that he prescribed a new inhaler for her but she decided she did not want any new medications and therefore did not get this prescription filled.  - According to our med rec, it appears that she also has Spiriva prescribed for her but she tells me that she was taken off of this medication by her PCP -At this point I will be reinstating the Spiriva and also adding Advair  -Will discharge home on a prednisone taper  -Of note there was a suspicion that the patient may also have congestive heart failure and she was given a one-time dose of Lasix-chest x-ray did not reveal any pulmonary edema-echo was performed yesterday- the study was poor-  results reveal that she has an EF of 60-65% with grade 1 diastolic dysfunction-at this point she does not need any further diuretics.  Diabetes mellitus type 2  -Insulin recently increased as outpatient by PCP  -Sugars slightly elevated here  due to IV steroids  -Continue current dose of outpatient insulin -sugar should normalize over the next few days as steroids are weaned   Hypertension  -Continue home medications   Obstructive sleep apnea -Continue C Pap at bed time  Morbid obesity -Patient advised to work on losing weight   Procedures:  none  Consultations:  none  Discharge Exam: Filed Weights   05/16/14 1116  Weight: 131.09 kg (289 lb)   Filed Vitals:   05/17/14 0943  BP: 131/58  Pulse:   Temp:   Resp:     General: AAO x 3, no distress Cardiovascular: RRR, no murmurs  Respiratory: clear to auscultation bilaterally GI: soft, non-tender, non-distended, bowel sound positive Extremities: No cyanosis clubbing or edema   Discharge Instructions You were cared for by a hospitalist during your hospital stay. If you have any questions about your discharge medications or the care you received while you were in the hospital after you are discharged, you can call the unit and asked to speak with the hospitalist on call if the hospitalist that took care of you is not available. Once you are discharged, your primary care physician will handle any further medical issues. Please note that NO REFILLS for any discharge medications will be authorized once you are discharged, as it is imperative that you return to your primary care physician (or establish a relationship with a primary care physician if you do not have one) for your aftercare needs so that they can reassess your need for medications and monitor your lab values.     Medication List  TAKE these medications        albuterol 108 (90 BASE) MCG/ACT inhaler  Commonly known as:  PROVENTIL HFA;VENTOLIN HFA  Inhale 2 puffs into the lungs every 4 (four) hours as needed for wheezing or shortness of breath.     amLODipine 10 MG tablet  Commonly known as:  NORVASC  Take 10 mg by mouth daily.     atenolol 50 MG tablet  Commonly known as:  TENORMIN  Take 50  mg by mouth daily.     cyclobenzaprine 10 MG tablet  Commonly known as:  FLEXERIL  Take 1 tablet (10 mg total) by mouth 2 (two) times daily as needed for muscle spasms.     DULoxetine 60 MG capsule  Commonly known as:  CYMBALTA  Take 60 mg by mouth daily.     Fluticasone-Salmeterol 100-50 MCG/DOSE Aepb  Commonly known as:  ADVAIR DISKUS  Inhale 1 puff into the lungs 2 (two) times daily.     hydrOXYzine 50 MG tablet  Commonly known as:  ATARAX/VISTARIL  Take 50 mg by mouth 3 (three) times daily as needed for anxiety.     insulin NPH-regular Human (70-30) 100 UNIT/ML injection  Commonly known as:  NOVOLIN 70/30  Inject 30 Units into the skin 2 (two) times daily with a meal.     INVOKANA 300 MG Tabs tablet  Generic drug:  canagliflozin  Take 300 mg by mouth daily.     ketoprofen 75 MG capsule  Commonly known as:  ORUDIS  Take 75 mg by mouth 3 (three) times daily as needed for pain (pain).     lisinopril 20 MG tablet  Commonly known as:  PRINIVIL,ZESTRIL  Take 20 mg by mouth 2 (two) times daily.     LORazepam 1 MG tablet  Commonly known as:  ATIVAN  Take 1 mg by mouth 3 (three) times daily as needed for anxiety.     omeprazole 20 MG capsule  Commonly known as:  PRILOSEC  Take 20 mg by mouth daily.     ondansetron 8 MG disintegrating tablet  Commonly known as:  ZOFRAN ODT  Take 1 tablet (8 mg total) by mouth every 8 (eight) hours as needed for nausea or vomiting.     orphenadrine 100 MG tablet  Commonly known as:  NORFLEX  Take 100 mg by mouth 2 (two) times daily as needed for muscle spasms.     oxyCODONE-acetaminophen 10-325 MG per tablet  Commonly known as:  PERCOCET  Take 1 tablet by mouth every 6 (six) hours as needed for pain.     predniSONE 10 MG tablet  Commonly known as:  DELTASONE  Take 40 mg tomorrow, 30 mg the next day, 20 mg the next day and 10 mg on the last day     risperiDONE 2 MG tablet  Commonly known as:  RISPERDAL  Take 2 mg by mouth at  bedtime.     sucralfate 1 G tablet  Commonly known as:  CARAFATE  Take 1 g by mouth daily.     tiotropium 18 MCG inhalation capsule  Commonly known as:  SPIRIVA  Place 1 capsule (18 mcg total) into inhaler and inhale daily.     trimethoprim-polymyxin b ophthalmic solution  Commonly known as:  POLYTRIM  Place 1 drop into the left eye every 4 (four) hours.       Allergies  Allergen Reactions  . Celery Oil Hives  . Pork-Derived Products Nausea And Vomiting    headach  .  Latex Itching    Powdered latex gloves.        The results of significant diagnostics from this hospitalization (including imaging, microbiology, ancillary and laboratory) are listed below for reference.    Significant Diagnostic Studies: Dg Chest Port 1 View  05/16/2014   CLINICAL DATA:  Shortness of breath.  History of asthma.  EXAM: PORTABLE CHEST - 1 VIEW  COMPARISON:  02/19/2014  FINDINGS: Low lung volumes without focal disease. Heart size is within normal limits based on the low lung volumes. The trachea is midline. No acute bone abnormality.  IMPRESSION: Low lung volumes without focal disease.   Electronically Signed   By: Markus Daft M.D.   On: 05/16/2014 12:56   Dg Knee Complete 4 Views Right  04/18/2014   CLINICAL DATA:  Right leg tightness and swelling for 3 weeks around the right knee.  EXAM: RIGHT KNEE - COMPLETE 4+ VIEW  COMPARISON:  11/12/2012  FINDINGS: Negative for a fracture or dislocation. Again noted is spurring in the lateral knee compartment. Mild spurring in the medial knee compartment. Degenerative changes and irregularity along the superior aspect of the patella. Difficult to exclude a small amount of joint fluid.  IMPRESSION: Mild to moderate osteoarthritis in the right knee. No acute bone abnormality.   Electronically Signed   By: Markus Daft M.D.   On: 04/18/2014 17:07   Dg Foot Complete Right  04/18/2014   CLINICAL DATA:  Right leg tightness and swelling. Right foot pain since surgery 2  years ago.  EXAM: RIGHT FOOT COMPLETE - 3+ VIEW  COMPARISON:  02/06/2013  FINDINGS: Plate and screws are present within the base of the first metatarsal and medial cuneiform for arthrodesis. There is a screw that transfixes the base of the first and second metatarsals. No breakage of the hardware. No definite acute fracture. Has plan as. Spurring at the inferior calcaneus.  IMPRESSION: No acute bony injury.  Postoperative changes.   Electronically Signed   By: Maryclare Bean M.D.   On: 04/18/2014 14:55    Microbiology: No results found for this or any previous visit (from the past 240 hour(s)).   Labs: Basic Metabolic Panel:  Recent Labs Lab 05/16/14 1133 05/17/14 0449  NA 134* 135*  K 4.5 4.5  CL 97 97  CO2 22 22  GLUCOSE 185* 254*  BUN 7 13  CREATININE 0.85 1.13*  CALCIUM 9.0 8.9  MG  --  2.1   Liver Function Tests: No results for input(s): AST, ALT, ALKPHOS, BILITOT, PROT, ALBUMIN in the last 168 hours. No results for input(s): LIPASE, AMYLASE in the last 168 hours. No results for input(s): AMMONIA in the last 168 hours. CBC:  Recent Labs Lab 05/16/14 1133 05/17/14 0449  WBC 6.0 9.2  HGB 14.4 14.3  HCT 43.6 43.4  MCV 86.7 86.8  PLT 188 180   Cardiac Enzymes: No results for input(s): CKTOTAL, CKMB, CKMBINDEX, TROPONINI in the last 168 hours. BNP: BNP (last 3 results)  Recent Labs  07/07/13 1019 01/13/14 0845 05/17/14 0449  PROBNP 9.6 13.3 24.3   CBG:  Recent Labs Lab 05/16/14 1825 05/16/14 2207 05/17/14 0622 05/17/14 0757 05/17/14 1208  GLUCAP 370* 296* 262* 296* 191*       SignedDebbe Odea, MD [facility] 05/17/2014, 1:50 PM

## 2014-05-20 ENCOUNTER — Ambulatory Visit (INDEPENDENT_AMBULATORY_CARE_PROVIDER_SITE_OTHER): Payer: Self-pay | Admitting: Neurology

## 2014-05-20 ENCOUNTER — Ambulatory Visit (INDEPENDENT_AMBULATORY_CARE_PROVIDER_SITE_OTHER): Payer: Medicaid Other | Admitting: Neurology

## 2014-05-20 DIAGNOSIS — M6289 Other specified disorders of muscle: Secondary | ICD-10-CM

## 2014-05-20 DIAGNOSIS — M6281 Muscle weakness (generalized): Secondary | ICD-10-CM

## 2014-05-20 DIAGNOSIS — M79642 Pain in left hand: Secondary | ICD-10-CM

## 2014-05-20 DIAGNOSIS — Z0289 Encounter for other administrative examinations: Secondary | ICD-10-CM

## 2014-05-20 DIAGNOSIS — R202 Paresthesia of skin: Secondary | ICD-10-CM

## 2014-05-20 DIAGNOSIS — G5602 Carpal tunnel syndrome, left upper limb: Secondary | ICD-10-CM

## 2014-05-20 DIAGNOSIS — R29898 Other symptoms and signs involving the musculoskeletal system: Secondary | ICD-10-CM

## 2014-05-20 NOTE — Progress Notes (Signed)
  [facility]    Provider:  Dr Jaynee Eagles Referring Provider: Melvenia Beam, MD Primary Care Physician:  Benito Mccreedy, MD   HPI:  Analya Louissaint is a 43 y.o. female here as a referral from Dr. Vista Lawman for left hand pain. She also has OSA, DM2, HTN, COPD, Morbid obesity. neuropathy. She has previous CTS release on the left hand. Now with several weeks of worsening numbness in the digits 1-2 of the left hand. Throbbing hand weakness, dropping everything. She can't hold a spoon. Getting worse. Is right handed. She is waking up in the middle of the night with numbness and having to shake out her hand. Worse with driving a car or any use. Continuous, shaking it out. No neck pain. No inciting factors. Associated with radiation up the arm.   Reviewed notes, labs and imaging from outside physicians, which showed; HgbA1c 8.6cmp/cbc unremarkable.b12 278.   Summary:   Left median motor nerve was within normal limits with normal F Response latency Left 2nd-digit Median sensory was within normal limits Left Ulnar ADM motor nerve was within normal limits with normal F response latency Left Ulnar 5th digit sensory nerve was within normal limits Left Radial sensory nerve was within normal limits Left Median/Ulnar palmar comparison study was within normal limits  The left Opponens Pollicis showed increased motor unit amplitude. Needle evaluation of the following muscles were within normal limits: left Deltoid, left Triceps, left Pronator teres,left First Dorsal Interosseous, left C6/C7/C8 paraspinal muscles.   Conclusion: Chronic neurogenic changes seen in a left distal median-innervated muscle consistent with previous history of carpal tunnel syndrome. There is no electrophysiologic evidence for acute or ongoing left Carpal Tunnel Syndrome or Ulnar neuropathy. No suggestion of polyneuropathy. No cervical radiculopathy seen on EMG however study is technically difficult due to  morbid obesity. Recommend imaging of the cervical spine and brain.    Sarina Ill, MD  [facility] [address] [place], [place] [address]  Phone [phone] Fax [phone]

## 2014-05-23 NOTE — Progress Notes (Signed)
Provider: Dr Jaynee Eagles  Referring Provider: Melvenia Beam, MD  Primary Care Physician: Benito Mccreedy, MD   HPI: Diana Henderson is a 43 y.o. female here as a referral from Dr. Vista Lawman for left hand pain. She also has OSA, DM2, HTN, COPD, Morbid obesity. neuropathy. She has previous CTS release on the left hand. Now with several weeks of worsening numbness in the digits 1-2 of the left hand. Throbbing hand weakness, dropping everything. She can't hold a spoon. Getting worse. Is right handed. She is waking up in the middle of the night with numbness and having to shake out her hand. Worse with driving a car or any use. Continuous, shaking it out. No neck pain. No inciting factors. Associated with radiation up the arm.  Reviewed notes, labs and imaging from outside physicians, which showed; HgbA1c 8.6cmp/cbc unremarkable.b12 278.   Summary:  Left median motor nerve was within normal limits with normal F Response latency  Left 2nd-digit Median sensory was within normal limits  Left Ulnar ADM motor nerve was within normal limits with normal F response latency  Left Ulnar 5th digit sensory nerve was within normal limits  Left Radial sensory nerve was within normal limits  Left Median/Ulnar palmar comparison study was within normal limits  The left Opponens Pollicis showed increased motor unit amplitude. Needle evaluation of the following muscles were within normal limits: left Deltoid, left Triceps, left Pronator teres,left First Dorsal Interosseous, left C6/C7/C8 paraspinal muscles.   Conclusion: Chronic neurogenic changes seen in a left distal median-innervated muscle consistent with previous history of carpal tunnel syndrome. There is no electrophysiologic evidence for acute or ongoing left Carpal Tunnel Syndrome or Ulnar neuropathy. No suggestion of polyneuropathy. No cervical radiculopathy seen on EMG however study is technically difficult due to morbid obesity. Recommend imaging of the  cervical spine and brain.   Sarina Ill, MD  [facility]  [address] [place]  [place], [place] [address]  Phone [phone] Fax [phone]

## 2014-05-25 DIAGNOSIS — F332 Major depressive disorder, recurrent severe without psychotic features: Secondary | ICD-10-CM | POA: Diagnosis not present

## 2014-06-04 DIAGNOSIS — F332 Major depressive disorder, recurrent severe without psychotic features: Secondary | ICD-10-CM | POA: Diagnosis not present

## 2014-06-09 ENCOUNTER — Ambulatory Visit (INDEPENDENT_AMBULATORY_CARE_PROVIDER_SITE_OTHER): Payer: Medicaid Other | Admitting: Pulmonary Disease

## 2014-06-09 ENCOUNTER — Other Ambulatory Visit: Payer: Self-pay | Admitting: *Deleted

## 2014-06-09 ENCOUNTER — Encounter: Payer: Self-pay | Admitting: Pulmonary Disease

## 2014-06-09 VITALS — BP 120/78 | HR 80 | Temp 97.6°F | Ht 66.0 in | Wt 288.8 lb

## 2014-06-09 DIAGNOSIS — R05 Cough: Secondary | ICD-10-CM

## 2014-06-09 DIAGNOSIS — R0602 Shortness of breath: Secondary | ICD-10-CM

## 2014-06-09 DIAGNOSIS — R059 Cough, unspecified: Secondary | ICD-10-CM | POA: Insufficient documentation

## 2014-06-09 DIAGNOSIS — J45901 Unspecified asthma with (acute) exacerbation: Secondary | ICD-10-CM

## 2014-06-09 MED ORDER — FLUTICASONE-SALMETEROL 100-50 MCG/DOSE IN AEPB: 1.0000 | INHALATION_SPRAY | Freq: Two times a day (BID) | RESPIRATORY_TRACT | Status: DC

## 2014-06-09 MED ORDER — OLMESARTAN MEDOXOMIL 20 MG PO TABS: 20.0000 mg | ORAL_TABLET | Freq: Every day | ORAL | Status: DC

## 2014-06-09 NOTE — Patient Instructions (Addendum)
Stay on your advair one inhalation am AND pm everyday no matter how you feel.  Rinse mouth well after using. Do not use albuterol (blue ventolin) unless having a real hard time breathing. Stop spiriva (the inhaler with the pill that you use inside the egg) Stop lisinopril.  I suspect this is making you cough and wheeze. Start on benicar 20mg , one a day until next visit.  Will then get you to your primary doctor for a substitute at a later date. followup with me in 3 weeks.

## 2014-06-09 NOTE — Progress Notes (Signed)
   Subjective:    Patient ID: Diana Henderson, female    DOB: 1971/05/24, 43 y.o.   MRN: [id number]  HPI The patient is a 43 year old female who I've been asked to see for management of asthma. She tells me that she was diagnosed with asthma starting one year ago, and has been on medication since that time. She has had 3 hospitalizations in the last one year for presumed asthma exacerbation, with the last being in November of this year. She describes chronic dyspnea on exertion, and feels that it is a little worse than one year ago. She denies any medical noncompliance, and has a misunderstanding about her rescue medication that she is using on a regular basis. She currently feels that her breathing is near her usual baseline. She tells me that she has frequent audible wheezing, and feels that there is something stuck in her throat that chokes her. She denies significant GERD issues, but does have a dry and hacking cough that is fairly frequent. It should be noted that she is on an ACE inhibitor and a fairly high dose. She denies any chronic sinusitis symptoms, and has only intermittent allergy symptoms. I do have pulmonary function studies from September of this year that appears to show airflow obstruction, and there is also an element of restriction related to her obesity.   Review of Systems  Constitutional: Positive for appetite change. Negative for fever and unexpected weight change.  HENT: Positive for congestion, ear pain and sneezing. Negative for dental problem, nosebleeds, postnasal drip, rhinorrhea, sinus pressure, sore throat and trouble swallowing.   Eyes: Negative for redness and itching.  Respiratory: Positive for shortness of breath. Negative for cough, chest tightness and wheezing.   Cardiovascular: Positive for chest pain and leg swelling. Negative for palpitations.  Gastrointestinal: Negative for nausea and vomiting.  Genitourinary: Negative for dysuria.  Musculoskeletal:  Negative for joint swelling.  Skin: Negative for rash.  Neurological: Positive for headaches.  Hematological: Does not bruise/bleed easily.  Psychiatric/Behavioral: Positive for dysphoric mood. The patient is nervous/anxious.        Objective:   Physical Exam Constitutional:  Morbidly obese female, no acute distress  HENT:  Nares patent without discharge, mild turbinate hypertrophy  Oropharynx without exudate, palate and uvula are elongated.  Eyes:  Perrla, eomi, no scleral icterus  Neck:  No JVD, no TMG  Cardiovascular:  Normal rate, regular rhythm, no rubs or gallops.  No murmurs        Intact distal pulses  Pulmonary :  Normal breath sounds, no stridor or respiratory distress   No rales, rhonchi, or wheezing  Abdominal:  Soft, nondistended, bowel sounds present.  No tenderness noted.   Musculoskeletal:  mild lower extremity edema noted.  Lymph Nodes:  No cervical lymphadenopathy noted  Skin:  No cyanosis noted  Neurologic:  Alert, appropriate, moves all 4 extremities without obvious deficit.         Assessment & Plan:

## 2014-06-09 NOTE — Assessment & Plan Note (Signed)
The patient has a chronic cough as well as classic symptoms for upper airway instability. She is describing ongoing upper airway wheezing, and I suspect this is all secondary to her ACE inhibitor. It is really unclear to me whether she has asthma or not, although she does have PFTs from September which do appear to show airflow obstruction. Her spirometry today is totally normal. She may indeed have asthma, but I do not think it is responsible for her upper airway wheezing. It is unclear whether she was admitted this year with more upper airway dysfunction than true asthma. For now, I would like to continue her on her Advair, but get her off the ACE inhibitor. If she does very well, I would then move toward minimizing or discontinuing her asthma medications to see if she really needs them.

## 2014-06-17 ENCOUNTER — Ambulatory Visit
Admission: RE | Admit: 2014-06-17 | Discharge: 2014-06-17 | Disposition: A | Discharge status: Home / Self Care | Payer: Medicaid Other | Source: Ambulatory Visit | Attending: Neurology | Admitting: Neurology

## 2014-06-17 ENCOUNTER — Encounter (INDEPENDENT_AMBULATORY_CARE_PROVIDER_SITE_OTHER): Payer: Medicaid Other | Admitting: Diagnostic Neuroimaging

## 2014-06-17 DIAGNOSIS — R202 Paresthesia of skin: Secondary | ICD-10-CM

## 2014-06-17 DIAGNOSIS — M6281 Muscle weakness (generalized): Secondary | ICD-10-CM

## 2014-06-21 ENCOUNTER — Telehealth: Payer: Self-pay | Admitting: Pulmonary Disease

## 2014-06-21 ENCOUNTER — Telehealth: Payer: Self-pay | Admitting: Diagnostic Neuroimaging

## 2014-06-21 NOTE — Telephone Encounter (Signed)
Pls call patient to review MRI results. I can discuss when she is on the phone. -VRP

## 2014-06-21 NOTE — Telephone Encounter (Signed)
[email or web address] for [facility]

## 2014-06-22 NOTE — Telephone Encounter (Signed)
Diana Henderson with [facility] is returning call - [phone]

## 2014-06-22 NOTE — Telephone Encounter (Signed)
Spoke by pone to this Patient of Dr Jaynee Eagles, needs results for abnormal MRI spine/ cord..  Patient was babysitting and the conversation was difficult due to  the background volume.  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 This can some times improve with steroids, to which she replied that she had been in hospital, being given steroids and it changed nothing. The patient would like to discuss the therapeutic options  / results on Friday wit Dr Jaynee Eagles and asked for a referral to neurosurgery.  CD

## 2014-06-22 NOTE — Telephone Encounter (Signed)
Called and spoke to Diana Henderson. [name] is requesting a current reviewed med list faxed to [phone]. Med list faxed to provided number. Charlene aware. Nothing further needed.

## 2014-06-23 ENCOUNTER — Ambulatory Visit (INDEPENDENT_AMBULATORY_CARE_PROVIDER_SITE_OTHER): Payer: Medicare Other | Admitting: Podiatry

## 2014-06-23 ENCOUNTER — Encounter: Payer: Self-pay | Admitting: Podiatry

## 2014-06-23 VITALS — BP 176/109 | HR 99

## 2014-06-23 DIAGNOSIS — M659 Synovitis and tenosynovitis, unspecified: Secondary | ICD-10-CM

## 2014-06-23 DIAGNOSIS — M25571 Pain in right ankle and joints of right foot: Secondary | ICD-10-CM | POA: Diagnosis not present

## 2014-06-23 DIAGNOSIS — M216X9 Other acquired deformities of unspecified foot: Secondary | ICD-10-CM | POA: Diagnosis not present

## 2014-06-23 NOTE — Progress Notes (Signed)
Subjective: 44 year old female presents complaining of pain in right foot. Feels like she is going to lose her balance.  Pain at the proximal end of dorsal incision right upon ambulation. Also pain with direct pressure over the first metatarsal head which is distal to  Dorsal incision from the first Tarsometatarsal joint fusion right foot.  Recently noted of having numbness in wrist and diagnosed for spinal nerve problem, possible surgery in discussion.  Patient is wearing surgical shoe/cast boot on right.   Objective: Palpable soft tissue mass, about a size of pea that is not fixed to underlying structure right dorsum proximal to incision. Post op pain continuing right foot. Old CT scan result reviewed again.   Assessment: Soft tissue mass over dorsum right, rule out neuroma. Arthropathy right first MPJ. 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  Plan: Reviewed findings. May require removal of painful mass. May require another procedure to plantar flex the first ray.  May need to change to regular shoe gear.  We will discuss next week with new x-ray examination.

## 2014-06-23 NOTE — Patient Instructions (Signed)
Seen for pain in right foot. May need to excise painful soft tissue lesion and another procedure to bring down the first Metatarsal bone. Return on Monday for another X-ray and pre op discussion.

## 2014-06-25 NOTE — Telephone Encounter (Signed)
Called and LM for patient to call and schedule an appt with Dr Jaynee Eagles to review her images next week.

## 2014-06-25 NOTE — Telephone Encounter (Signed)
Diana Henderson - would you ask Patient to come for a follow up appointment next week so we can review her scans? If I don't have any openings, I am happy to open up a 9:30am or a 4pm for her. I need 30 minutes. Thank you.

## 2014-06-28 ENCOUNTER — Encounter: Payer: Self-pay | Admitting: Podiatry

## 2014-06-28 ENCOUNTER — Ambulatory Visit (INDEPENDENT_AMBULATORY_CARE_PROVIDER_SITE_OTHER): Payer: Medicare Other | Admitting: Podiatry

## 2014-06-28 ENCOUNTER — Other Ambulatory Visit: Payer: Self-pay | Admitting: Neurology

## 2014-06-28 ENCOUNTER — Telehealth: Payer: Self-pay | Admitting: Neurology

## 2014-06-28 DIAGNOSIS — M216X9 Other acquired deformities of unspecified foot: Secondary | ICD-10-CM

## 2014-06-28 DIAGNOSIS — M4712 Other spondylosis with myelopathy, cervical region: Secondary | ICD-10-CM

## 2014-06-28 DIAGNOSIS — M21961 Unspecified acquired deformity of right lower leg: Secondary | ICD-10-CM | POA: Diagnosis not present

## 2014-06-28 DIAGNOSIS — M659 Synovitis and tenosynovitis, unspecified: Secondary | ICD-10-CM

## 2014-06-28 DIAGNOSIS — M79673 Pain in unspecified foot: Secondary | ICD-10-CM | POA: Diagnosis not present

## 2014-06-28 NOTE — Patient Instructions (Signed)
Seen for pain in right foot. Surgery consent form reviewed for removal of hardware and cotton osteotomy with bone graft right foot.

## 2014-06-28 NOTE — Progress Notes (Signed)
Subjective: 44 year old female presents requesting surgical option on painful right foot.   Objective: Palpable soft tissue mass, about a size of pea that is not fixed to underlying structure right dorsum proximal to incision. Post op pain continuing right foot. Old CT scan result reviewed again.   Assessment: Soft tissue mass over dorsum right, rule out neuroma. Arthropathy right first MPJ. 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  Plan: Reviewed findings. Surgery consent form reviewed for removal of hardware and cotton osteotomy with bone graft right foot.

## 2014-06-28 NOTE — Telephone Encounter (Signed)
Pt is calling wanting to get the results from her NCV/EMG.  Please call and advise.

## 2014-06-28 NOTE — Telephone Encounter (Signed)
Please advise 

## 2014-06-29 ENCOUNTER — Encounter: Payer: Self-pay | Admitting: Neurology

## 2014-06-29 ENCOUNTER — Ambulatory Visit (INDEPENDENT_AMBULATORY_CARE_PROVIDER_SITE_OTHER): Payer: Medicare Other | Admitting: Neurology

## 2014-06-29 VITALS — BP 160/86 | HR 94 | Ht 66.0 in | Wt 300.0 lb

## 2014-06-29 DIAGNOSIS — M5000 Cervical disc disorder with myelopathy, unspecified cervical region: Secondary | ICD-10-CM | POA: Insufficient documentation

## 2014-06-29 NOTE — Progress Notes (Signed)
[facility]    Provider:  Dr Jaynee Eagles Referring Provider: Benito Mccreedy, MD Primary Care Physician:  Benito Mccreedy, MD  CC:  Left hand pain  HPI:  Diana Henderson is a 44 y.o. female here as a follow up for left hand pain. She also has OSA, uncontrolled DM2, HTN, COPD, Morbid obesity, diabetic neuropathy. She has previous CTS release on the left hand. Now with several months of worsening numbness in the digits 1-2 of the left hand. Throbbing and weakness, dropping everything. MRi of the cervical cord revealed At C5-6: posterior right para-sagittal disc protrusion (37mm) with severe spinal cord compression; mild STIR hyperintense signal within the cord may represent edema or gliosis. Adjacent thickening and ossification of posterior longitudinal ligament.(personally reviewed and agree with findings). Patient is here today for follow up of MRI findings.     Review of Systems: Patient complains of symptoms per HPI as well as the following symptoms Denies bowel/bladder changes. Pertinent negatives per HPI. All others negative.   History   Social History  . Marital Status: Single    Spouse Name: N/A    Number of Children: 1  . Years of Education: 12   Occupational History  . disabled    Social History Main Topics  . Smoking status: Former Smoker -- 2.00 packs/day for 6 years    Types: Cigarettes    Quit date: 06/18/1978  . Smokeless tobacco: Never Used  . Alcohol Use: No  . Drug Use: No  . Sexual Activity: No   Other Topics Concern  . Not on file   Social History Narrative   Lives with daughter, works at [employer], does not exercise but is on her feet at work.    Family History  Problem Relation Age of Onset  . Heart attack Father   . Kidney disease Mother     Past Medical History  Diagnosis Date  . Diabetes mellitus   . Hypertension   . Asthma   . Hyperlipidemia   . Sleep apnea   . Depression   . PTSD (post-traumatic stress disorder)    . Pain in joint, ankle and foot 02/17/2013  . Anxiety   . Bipolar 1 disorder     Past Surgical History  Procedure Laterality Date  . Tubal ligation    . Midtarsal arthrodesis Right 07/17/12  . Remove implant deep Right 07/17/12  . Lapidus fusion Right 01/10/12  . Carpal tunnel release Left 2001    Left wrist  . Gastroc recession extremity Right 3//20/14    Right foot  . Hernia repair      1996    Current Outpatient Prescriptions  Medication Sig Dispense Refill  . albuterol (PROVENTIL HFA;VENTOLIN HFA) 108 (90 BASE) MCG/ACT inhaler Inhale 2 puffs into the lungs every 4 (four) hours as needed for wheezing or shortness of breath.    Marland Kitchen amLODipine (NORVASC) 10 MG tablet Take 10 mg by mouth daily.    Marland Kitchen atenolol (TENORMIN) 50 MG tablet Take 50 mg by mouth daily.    . Canagliflozin (INVOKANA) 300 MG TABS Take 300 mg by mouth daily.    . cyclobenzaprine (FLEXERIL) 10 MG tablet Take 1 tablet (10 mg total) by mouth 2 (two) times daily as needed for muscle spasms. 20 tablet 0  . DULoxetine (CYMBALTA) 60 MG capsule Take 60 mg by mouth daily.    . Fluticasone-Salmeterol (ADVAIR DISKUS) 100-50 MCG/DOSE AEPB Inhale 1 puff into the lungs 2 (two) times daily. 60 each 0  . hydrOXYzine (ATARAX/VISTARIL)  50 MG tablet Take 50 mg by mouth 3 (three) times daily as needed for anxiety.     . insulin NPH-regular Human (NOVOLIN 70/30) (70-30) 100 UNIT/ML injection Inject 30 Units into the skin 2 (two) times daily with a meal.    . ketoprofen (ORUDIS) 75 MG capsule Take 75 mg by mouth 3 (three) times daily as needed for pain (pain).    . LORazepam (ATIVAN) 1 MG tablet Take 1 mg by mouth 3 (three) times daily as needed for anxiety.    Marland Kitchen olmesartan (BENICAR) 20 MG tablet Take 1 tablet (20 mg total) by mouth daily. 30 tablet 0  . omeprazole (PRILOSEC) 20 MG capsule Take 20 mg by mouth daily.    . ondansetron (ZOFRAN ODT) 8 MG disintegrating tablet Take 1 tablet (8 mg total) by mouth every 8 (eight) hours as needed  for nausea or vomiting. 15 tablet 0  . orphenadrine (NORFLEX) 100 MG tablet Take 100 mg by mouth 2 (two) times daily as needed for muscle spasms.    . risperiDONE (RISPERDAL) 2 MG tablet Take 2 mg by mouth at bedtime.    . sucralfate (CARAFATE) 1 G tablet Take 1 g by mouth daily.    Marland Kitchen trimethoprim-polymyxin b (POLYTRIM) ophthalmic solution Place 1 drop into the left eye every 4 (four) hours. (Patient taking differently: Place 1 drop into the left eye 2 (two) times daily. ) 10 mL 0   No current facility-administered medications for this visit.    Allergies as of 06/29/2014 - Review Complete 06/29/2014  Allergen Reaction Noted  . Celery oil Hives 11/12/2012  . Pork-derived products Nausea And Vomiting 02/17/2014  . Latex Itching 11/12/2012    Vitals: BP 160/86 mmHg  Pulse 94  Ht 5\' 6"  (1.676 m)  Wt 300 lb (136.079 kg)  BMI 48.44 kg/m2 Last Weight:  Wt Readings from Last 1 Encounters:  06/29/14 300 lb (136.079 kg)   Last Height:   Ht Readings from Last 1 Encounters:  06/29/14 5\' 6"  (1.676 m)         Assessment/Plan:  Reviewed MRI images with patient and discussed the findings which included cervical cord myelopathy and the significance of this finding, as the compression can cause irreversible cord damage and a variety af symptoms including paralysis. Referring to neurosurgery. If she should deveop any concerning symptoms such as bowel/bladder changes, increased symptoms such as weakness, urinary or bowel changes or anything concerning that she should proceed to the emergency room.  A total of 15 minutes was spent in with this patient. Over half this time was spent on counseling patient on the diagnosis and different therapeutic options available.    Sarina Ill, MD  [facility] [address], [place] [address]  Phone [phone] Fax [phone]

## 2014-06-29 NOTE — Patient Instructions (Addendum)
Remember to drink plenty of fluid, eat healthy meals and do not skip any meals. Try to eat protein with a every meal and eat a healthy snack such as fruit or nuts in between meals. Try to keep a regular sleep-wake schedule and try to exercise daily, particularly in the form of walking, 20-30 minutes a day, if you can.   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 Please call us with any interim questions, concerns, problems, updates or refill requests.   Please also call us for any test results so we can go over those with you on the phone.  My clinical assistant and will answer any of your questions and relay your messages to me and also relay most of my messages to you.   Our phone number is [phone]. We also have an after hours call service for urgent matters and there is a physician on-call for urgent questions. For any emergencies you know to call 911 or go to the nearest emergency room

## 2014-07-05 ENCOUNTER — Encounter: Payer: Self-pay | Admitting: Pulmonary Disease

## 2014-07-05 ENCOUNTER — Ambulatory Visit (INDEPENDENT_AMBULATORY_CARE_PROVIDER_SITE_OTHER): Payer: Medicare Other | Admitting: Pulmonary Disease

## 2014-07-05 VITALS — BP 136/74 | HR 84 | Temp 98.0°F | Ht 66.0 in | Wt 297.0 lb

## 2014-07-05 DIAGNOSIS — R05 Cough: Secondary | ICD-10-CM

## 2014-07-05 DIAGNOSIS — R0609 Other forms of dyspnea: Secondary | ICD-10-CM | POA: Diagnosis not present

## 2014-07-05 DIAGNOSIS — R059 Cough, unspecified: Secondary | ICD-10-CM

## 2014-07-05 MED ORDER — BECLOMETHASONE DIPROPIONATE 80 MCG/ACT IN AERS: 2.0000 | INHALATION_SPRAY | Freq: Two times a day (BID) | RESPIRATORY_TRACT | Status: DC

## 2014-07-05 NOTE — Progress Notes (Signed)
   Subjective:    Patient ID: Diana Henderson, female    DOB: 09-08-1970, 44 y.o.   MRN: [id number]  HPI The patient comes in today for follow-up of her complaint of cough and also questionable asthma. She was taken off her ACE inhibitor at the last visit, and asked to continue on her reflux regimen. Feels that her cough is definitely improved, but is still present. It is unclear whether she really has asthma or not, but she has maintained on Advair. Her spirometry at the last visit was normal. She is still complaining of dyspnea on exertion, but I do not feel it has anything to do with her asthma. She is obese and significantly deconditioned.   Review of Systems  Constitutional: Negative for fever and unexpected weight change.  HENT: Negative for congestion, dental problem, ear pain, nosebleeds, postnasal drip, rhinorrhea, sinus pressure, sneezing, sore throat and trouble swallowing.   Eyes: Negative for redness and itching.  Respiratory: Positive for cough, chest tightness and shortness of breath. Negative for wheezing.   Cardiovascular: Negative for palpitations and leg swelling.  Gastrointestinal: Negative for nausea and vomiting.  Genitourinary: Negative for dysuria.  Musculoskeletal: Negative for joint swelling.  Skin: Negative for rash.  Neurological: Negative for headaches.  Hematological: Does not bruise/bleed easily.  Psychiatric/Behavioral: Negative for dysphoric mood. The patient is not nervous/anxious.        Objective:   Physical Exam Obese female in no acute distress Nose without purulence or discharge noted Neck without lymphadenopathy or thyromegaly Chest is totally clear to auscultation Cardiac exam with regular rate and rhythm Lower extremities with edema noted, no cyanosis Alert and oriented, moves all 4 extremities.       Assessment & Plan:

## 2014-07-05 NOTE — Patient Instructions (Signed)
Please discuss with your primary doctor about getting a substitute for your blood pressure medication.  You cannot be on the mecations called "ACE inhibitors" that end in "pril". Stop advair, and start on qvar 80  2 inhalations each am AND pm with spacer.  Rinse mouth out well.  Will send in prescription for this as well.  Work on weight loss followup with me again in 27mos, but call if having problems.

## 2014-07-05 NOTE — Assessment & Plan Note (Signed)
The patient's cough is much improved since being off the ACE inhibitor. I have told her it may take 8-12 weeks to get the medication completely out of her system. So think reflux may be contributing to her if she has well. I do not think that asthma has anything to do with her cough at this time.

## 2014-07-05 NOTE — Assessment & Plan Note (Addendum)
The patient has chronic dyspnea on exertion secondary to her morbid obesity and deconditioning with musculoskeletal issues as well. Her spirometry was totally normal at her last visit. Because cough has been an ongoing issue for her, will discontinue Advair and try her on Qvar with a smaller particles size. It remains unclear to me whether she really does have asthma or not, but she did have PFTs in the past which showed airflow obstruction but technique was questionable.

## 2014-07-06 DIAGNOSIS — N920 Excessive and frequent menstruation with regular cycle: Secondary | ICD-10-CM | POA: Diagnosis not present

## 2014-07-06 DIAGNOSIS — K219 Gastro-esophageal reflux disease without esophagitis: Secondary | ICD-10-CM | POA: Diagnosis not present

## 2014-07-06 DIAGNOSIS — J45909 Unspecified asthma, uncomplicated: Secondary | ICD-10-CM | POA: Diagnosis not present

## 2014-07-06 DIAGNOSIS — G4733 Obstructive sleep apnea (adult) (pediatric): Secondary | ICD-10-CM | POA: Diagnosis not present

## 2014-07-06 DIAGNOSIS — I10 Essential (primary) hypertension: Secondary | ICD-10-CM | POA: Diagnosis not present

## 2014-07-06 DIAGNOSIS — I119 Hypertensive heart disease without heart failure: Secondary | ICD-10-CM | POA: Diagnosis not present

## 2014-07-06 DIAGNOSIS — J449 Chronic obstructive pulmonary disease, unspecified: Secondary | ICD-10-CM | POA: Diagnosis not present

## 2014-07-06 DIAGNOSIS — E119 Type 2 diabetes mellitus without complications: Secondary | ICD-10-CM | POA: Diagnosis not present

## 2014-07-07 DIAGNOSIS — M4712 Other spondylosis with myelopathy, cervical region: Secondary | ICD-10-CM | POA: Diagnosis not present

## 2014-07-07 DIAGNOSIS — I10 Essential (primary) hypertension: Secondary | ICD-10-CM | POA: Diagnosis not present

## 2014-07-07 DIAGNOSIS — Z6841 Body Mass Index (BMI) 40.0 and over, adult: Secondary | ICD-10-CM | POA: Diagnosis not present

## 2014-07-13 ENCOUNTER — Encounter: Payer: Medicare Other | Admitting: Podiatry

## 2014-07-22 DIAGNOSIS — M216X9 Other acquired deformities of unspecified foot: Secondary | ICD-10-CM | POA: Diagnosis not present

## 2014-07-22 DIAGNOSIS — T8484XA Pain due to internal orthopedic prosthetic devices, implants and grafts, initial encounter: Secondary | ICD-10-CM | POA: Diagnosis not present

## 2014-07-22 HISTORY — PX: OTHER SURGICAL HISTORY: SHX169

## 2014-07-27 ENCOUNTER — Ambulatory Visit (INDEPENDENT_AMBULATORY_CARE_PROVIDER_SITE_OTHER): Payer: Medicare Other | Admitting: Podiatry

## 2014-07-27 ENCOUNTER — Encounter: Payer: Self-pay | Admitting: Podiatry

## 2014-07-27 DIAGNOSIS — Z9889 Other specified postprocedural states: Secondary | ICD-10-CM

## 2014-07-27 NOTE — Patient Instructions (Signed)
One week post op. Surgical site is sore but doing well. Dressing changed. Return in one week.

## 2014-07-27 NOTE — Progress Notes (Signed)
One week post op following removal of internal fixation device right foot. Stated that she is doing well other than sore foot. Her grand child stepped on her foot twice right after the surgery.  Normal wound healing noted. Clean and dry. No edema or erythema noted. Dressing changed. Return in one week.

## 2014-08-03 ENCOUNTER — Encounter: Payer: Self-pay | Admitting: Podiatry

## 2014-08-03 ENCOUNTER — Ambulatory Visit (INDEPENDENT_AMBULATORY_CARE_PROVIDER_SITE_OTHER): Payer: Medicare Other | Admitting: Podiatry

## 2014-08-03 DIAGNOSIS — M25571 Pain in right ankle and joints of right foot: Secondary | ICD-10-CM | POA: Diagnosis not present

## 2014-08-03 DIAGNOSIS — S99821A Other specified injuries of right foot, initial encounter: Secondary | ICD-10-CM | POA: Diagnosis not present

## 2014-08-03 DIAGNOSIS — W108XXA Fall (on) (from) other stairs and steps, initial encounter: Secondary | ICD-10-CM | POA: Insufficient documentation

## 2014-08-03 MED ORDER — OXYCODONE-ACETAMINOPHEN 10-325 MG PO TABS: 1.0000 | ORAL_TABLET | Freq: Four times a day (QID) | ORAL | Status: DC | PRN

## 2014-08-03 NOTE — Patient Instructions (Signed)
Post op wound following removal of internal fixation plate and screws. Pain from a recent fall. Increase rest and off weight bearing. CAM walker dispensed. Take pain medication as needed. Return in 3 weeks.

## 2014-08-03 NOTE — Progress Notes (Signed)
Subjective: This is 10 days following internal fixation removal right foot.  Stated that she fell on ice over the weekend. Fell on left side and then to right. Foot feels bad and hardly can walk.   Objective: No edema or erythema noted on right foot. Normal wound healing. Pain upon weight bearing. Radiographic examination reveal normal finding at fusion site without hardware. No acute changes noted.   Assessment: Excess pain at surgical site after a fall over the weekend.  Normal wound healing following internal fixation device removal, screws and plate from first MCJ fusion..   Plan: Right lower limb placed in CAM walker to assist in ambulation. Pain medication prescribed as per request. Return in 3 weeks.

## 2014-08-05 ENCOUNTER — Other Ambulatory Visit (HOSPITAL_COMMUNITY): Payer: Self-pay | Admitting: Neurosurgery

## 2014-08-05 DIAGNOSIS — E119 Type 2 diabetes mellitus without complications: Secondary | ICD-10-CM | POA: Diagnosis not present

## 2014-08-05 DIAGNOSIS — I119 Hypertensive heart disease without heart failure: Secondary | ICD-10-CM | POA: Diagnosis not present

## 2014-08-05 DIAGNOSIS — I10 Essential (primary) hypertension: Secondary | ICD-10-CM | POA: Diagnosis not present

## 2014-08-05 DIAGNOSIS — G4733 Obstructive sleep apnea (adult) (pediatric): Secondary | ICD-10-CM | POA: Diagnosis not present

## 2014-08-05 DIAGNOSIS — J45909 Unspecified asthma, uncomplicated: Secondary | ICD-10-CM | POA: Diagnosis not present

## 2014-08-05 DIAGNOSIS — N920 Excessive and frequent menstruation with regular cycle: Secondary | ICD-10-CM | POA: Diagnosis not present

## 2014-08-05 DIAGNOSIS — J449 Chronic obstructive pulmonary disease, unspecified: Secondary | ICD-10-CM | POA: Diagnosis not present

## 2014-08-05 DIAGNOSIS — K219 Gastro-esophageal reflux disease without esophagitis: Secondary | ICD-10-CM | POA: Diagnosis not present

## 2014-08-19 ENCOUNTER — Other Ambulatory Visit (HOSPITAL_COMMUNITY): Payer: Self-pay | Admitting: *Deleted

## 2014-08-20 ENCOUNTER — Encounter (HOSPITAL_COMMUNITY)
Admission: RE | Admit: 2014-08-20 | Discharge: 2014-08-20 | Disposition: A | Discharge status: Home / Self Care | Payer: Medicare Other | Source: Ambulatory Visit | Attending: Neurosurgery | Admitting: Neurosurgery

## 2014-08-20 ENCOUNTER — Encounter (HOSPITAL_COMMUNITY): Payer: Self-pay

## 2014-08-20 DIAGNOSIS — M4712 Other spondylosis with myelopathy, cervical region: Secondary | ICD-10-CM | POA: Diagnosis not present

## 2014-08-20 DIAGNOSIS — Z01812 Encounter for preprocedural laboratory examination: Secondary | ICD-10-CM | POA: Insufficient documentation

## 2014-08-20 DIAGNOSIS — I517 Cardiomegaly: Secondary | ICD-10-CM | POA: Insufficient documentation

## 2014-08-20 DIAGNOSIS — J45909 Unspecified asthma, uncomplicated: Secondary | ICD-10-CM | POA: Insufficient documentation

## 2014-08-20 DIAGNOSIS — E119 Type 2 diabetes mellitus without complications: Secondary | ICD-10-CM | POA: Diagnosis not present

## 2014-08-20 DIAGNOSIS — Z01818 Encounter for other preprocedural examination: Secondary | ICD-10-CM | POA: Diagnosis not present

## 2014-08-20 HISTORY — DX: Unspecified osteoarthritis, unspecified site: M19.90

## 2014-08-20 HISTORY — DX: Headache, unspecified: R51.9

## 2014-08-20 HISTORY — DX: Gastro-esophageal reflux disease without esophagitis: K21.9

## 2014-08-20 HISTORY — DX: Pneumonia, unspecified organism: J18.9

## 2014-08-20 HISTORY — DX: Headache: R51

## 2014-08-20 LAB — BASIC METABOLIC PANEL
Anion gap: 8 (ref 5–15)
BUN: 5 mg/dL — AB (ref 6–23)
CO2: 26 mmol/L (ref 19–32)
Calcium: 9.1 mg/dL (ref 8.4–10.5)
Chloride: 105 mmol/L (ref 96–112)
Creatinine, Ser: 0.96 mg/dL (ref 0.50–1.10)
GFR, EST AFRICAN AMERICAN: 82 mL/min — AB (ref >90)
GFR, EST NON AFRICAN AMERICAN: 70 mL/min — AB (ref >90)
Glucose, Bld: 170 mg/dL — ABNORMAL HIGH (ref 70–99)
Potassium: 4 mmol/L (ref 3.5–5.1)
SODIUM: 139 mmol/L (ref 135–145)

## 2014-08-20 LAB — HCG, SERUM, QUALITATIVE: Preg, Serum: NEGATIVE

## 2014-08-20 LAB — SURGICAL PCR SCREEN
MRSA, PCR: NEGATIVE
Staphylococcus aureus: NEGATIVE

## 2014-08-20 LAB — CBC
HCT: 43 % (ref 36.0–46.0)
Hemoglobin: 14 g/dL (ref 12.0–15.0)
MCH: 28.1 pg (ref 26.0–34.0)
MCHC: 32.6 g/dL (ref 30.0–36.0)
MCV: 86.2 fL (ref 78.0–100.0)
PLATELETS: 164 10*3/uL (ref 150–400)
RBC: 4.99 MIL/uL (ref 3.87–5.11)
RDW: 14.1 % (ref 11.5–15.5)
WBC: 4.3 10*3/uL (ref 4.0–10.5)

## 2014-08-20 NOTE — Progress Notes (Signed)
Pt. Followed by Dr. Vista Lawman, also seen at [facility] for emotional support & meds. Management.

## 2014-08-20 NOTE — Progress Notes (Signed)
Call to MD office, spoke with Manuela Schwartz, asked that MD sign his preop orders.

## 2014-08-20 NOTE — Progress Notes (Signed)
Pt. BP's elevated, report to A. Zelenak after the pt.  had left the hosp. While pt. Was here in PAT, pt. Admitted that she hadn't taken her meds. this a.m.  Pt. Coached to take her meds. this a.m. BP at 1-18/2016 visit to Pulmonary- was 136/74.

## 2014-08-20 NOTE — Pre-Procedure Instructions (Signed)
Diana Henderson  08/20/2014   Your procedure is scheduled on:  09/03/2014  Report to [facility] Admitting at 5:30 AM.  Call this number if you have problems the morning of surgery: [phone]   Remember:   Do not eat food or drink liquids after midnight.  On Thursday    Take these medicines the morning of surgery with A SIP OF WATER: AMLODIPINE, use inhalers, ATENOLOL, Cymbalta,Lorazepam, omeprazole and  PAIN MEDICINE is OK the morning odf surgery if needed    Do not wear jewelry, make-up or nail polish.   Do not wear lotions, powders, or perfumes. You may wear deodorant.   Do not shave 48 hours prior to surgery.    Do not bring valuables to the hospital.  [facility] is not responsible                  for any belongings or valuables.               Contacts, dentures or bridgework may not be worn into surgery.   Leave suitcase in the car. After surgery it may be brought to your room.   For patients admitted to the hospital, discharge time is determined by your                treatment team.               Patients discharged the day of surgery will not be allowed to drive  home.  Name and phone number of your driver: with family  Special Instructions: Special Instructions: [place] - Preparing for Surgery  Before surgery, you can play an important role.  Because skin is not sterile, your skin needs to be as free of germs as possible.  You can reduce the number of germs on you skin by washing with CHG (chlorahexidine gluconate) soap before surgery.  CHG is an antiseptic cleaner which kills germs and bonds with the skin to continue killing germs even after washing.  Please DO NOT use if you have an allergy to CHG or antibacterial soaps.  If your skin becomes reddened/irritated stop using the CHG and inform your nurse when you arrive at Short Stay.  Do not shave (including legs and underarms) for at least 48 hours prior to the first CHG shower.  You may shave your  face.  Please follow these instructions carefully:   1.  Shower with CHG Soap the night before surgery and the  morning of Surgery.  2.  If you choose to wash your hair, wash your hair first as usual with your  normal shampoo.  3.  After you shampoo, rinse your hair and body thoroughly to remove the  Shampoo.  4.  Use CHG as you would any other liquid soap.  You can apply chg directly to the skin and wash gently with scrungie or a clean washcloth.  5.  Apply the CHG Soap to your body ONLY FROM THE NECK DOWN.    Do not use on open wounds or open sores.  Avoid contact with your eyes, ears, mouth and genitals (private parts).  Wash genitals (private parts)   with your normal soap.  6.  Wash thoroughly, paying special attention to the area where your surgery will be performed.  7.  Thoroughly rinse your body with warm water from the neck down.  8.  DO NOT shower/wash with your normal soap after using and rinsing off   the CHG Soap.  9.  Pat yourself dry with a clean towel.            10.  Wear clean pajamas.            11.  Place clean sheets on your bed the night of your first shower and do not sleep with pets.  Day of Surgery  Do not apply any lotions/deodorants the morning of surgery.  Please wear clean clothes to the hospital/surgery center.   Please read over the following fact sheets that you were given: Pain Booklet, Coughing and Deep Breathing, MRSA Information and Surgical Site Infection Prevention

## 2014-08-20 NOTE — Progress Notes (Signed)
Pt. C/o cough, pt. Reports " when I cough I feel a tightening in my chest". Will do CXR today.

## 2014-08-24 ENCOUNTER — Emergency Department (HOSPITAL_COMMUNITY): Payer: Medicare Other

## 2014-08-24 ENCOUNTER — Emergency Department (HOSPITAL_COMMUNITY)
Admission: EM | Admit: 2014-08-24 | Discharge: 2014-08-24 | Disposition: A | Discharge status: Home / Self Care | Payer: Medicare Other | Attending: Emergency Medicine | Admitting: Emergency Medicine

## 2014-08-24 ENCOUNTER — Other Ambulatory Visit: Payer: Self-pay

## 2014-08-24 ENCOUNTER — Encounter (HOSPITAL_COMMUNITY): Payer: Self-pay | Admitting: Emergency Medicine

## 2014-08-24 ENCOUNTER — Encounter: Payer: Medicare Other | Admitting: Podiatry

## 2014-08-24 DIAGNOSIS — Z794 Long term (current) use of insulin: Secondary | ICD-10-CM | POA: Diagnosis not present

## 2014-08-24 DIAGNOSIS — M1990 Unspecified osteoarthritis, unspecified site: Secondary | ICD-10-CM | POA: Insufficient documentation

## 2014-08-24 DIAGNOSIS — Z7951 Long term (current) use of inhaled steroids: Secondary | ICD-10-CM | POA: Diagnosis not present

## 2014-08-24 DIAGNOSIS — K219 Gastro-esophageal reflux disease without esophagitis: Secondary | ICD-10-CM | POA: Insufficient documentation

## 2014-08-24 DIAGNOSIS — G4730 Sleep apnea, unspecified: Secondary | ICD-10-CM | POA: Diagnosis not present

## 2014-08-24 DIAGNOSIS — Z79899 Other long term (current) drug therapy: Secondary | ICD-10-CM | POA: Diagnosis not present

## 2014-08-24 DIAGNOSIS — F419 Anxiety disorder, unspecified: Secondary | ICD-10-CM | POA: Diagnosis not present

## 2014-08-24 DIAGNOSIS — R0602 Shortness of breath: Secondary | ICD-10-CM | POA: Diagnosis not present

## 2014-08-24 DIAGNOSIS — Z3202 Encounter for pregnancy test, result negative: Secondary | ICD-10-CM | POA: Insufficient documentation

## 2014-08-24 DIAGNOSIS — J45901 Unspecified asthma with (acute) exacerbation: Secondary | ICD-10-CM | POA: Diagnosis not present

## 2014-08-24 DIAGNOSIS — G43909 Migraine, unspecified, not intractable, without status migrainosus: Secondary | ICD-10-CM | POA: Diagnosis not present

## 2014-08-24 DIAGNOSIS — Z8701 Personal history of pneumonia (recurrent): Secondary | ICD-10-CM | POA: Diagnosis not present

## 2014-08-24 DIAGNOSIS — E119 Type 2 diabetes mellitus without complications: Secondary | ICD-10-CM | POA: Insufficient documentation

## 2014-08-24 DIAGNOSIS — Z87891 Personal history of nicotine dependence: Secondary | ICD-10-CM | POA: Diagnosis not present

## 2014-08-24 DIAGNOSIS — Z91040 Latex allergy status: Secondary | ICD-10-CM | POA: Insufficient documentation

## 2014-08-24 DIAGNOSIS — I10 Essential (primary) hypertension: Secondary | ICD-10-CM | POA: Insufficient documentation

## 2014-08-24 DIAGNOSIS — Z9981 Dependence on supplemental oxygen: Secondary | ICD-10-CM | POA: Diagnosis not present

## 2014-08-24 DIAGNOSIS — F17210 Nicotine dependence, cigarettes, uncomplicated: Secondary | ICD-10-CM | POA: Diagnosis not present

## 2014-08-24 DIAGNOSIS — R05 Cough: Secondary | ICD-10-CM | POA: Diagnosis not present

## 2014-08-24 DIAGNOSIS — F319 Bipolar disorder, unspecified: Secondary | ICD-10-CM | POA: Insufficient documentation

## 2014-08-24 LAB — I-STAT CHEM 8, ED
BUN: 9 mg/dL (ref 6–23)
Calcium, Ion: 1.2 mmol/L (ref 1.12–1.23)
Chloride: 101 mmol/L (ref 96–112)
Creatinine, Ser: 1 mg/dL (ref 0.50–1.10)
Glucose, Bld: 219 mg/dL — ABNORMAL HIGH (ref 70–99)
HEMATOCRIT: 48 % — AB (ref 36.0–46.0)
Hemoglobin: 16.3 g/dL — ABNORMAL HIGH (ref 12.0–15.0)
POTASSIUM: 3.8 mmol/L (ref 3.5–5.1)
Sodium: 137 mmol/L (ref 135–145)
TCO2: 21 mmol/L (ref 0–100)

## 2014-08-24 LAB — CBC WITH DIFFERENTIAL/PLATELET
BASOS PCT: 0 % (ref 0–1)
Basophils Absolute: 0 10*3/uL (ref 0.0–0.1)
EOS PCT: 1 % (ref 0–5)
Eosinophils Absolute: 0 10*3/uL (ref 0.0–0.7)
HCT: 42.5 % (ref 36.0–46.0)
Hemoglobin: 14.1 g/dL (ref 12.0–15.0)
Lymphocytes Relative: 30 % (ref 12–46)
Lymphs Abs: 1.6 10*3/uL (ref 0.7–4.0)
MCH: 28.6 pg (ref 26.0–34.0)
MCHC: 33.2 g/dL (ref 30.0–36.0)
MCV: 86.2 fL (ref 78.0–100.0)
MONO ABS: 0.4 10*3/uL (ref 0.1–1.0)
MONOS PCT: 7 % (ref 3–12)
NEUTROS ABS: 3.3 10*3/uL (ref 1.7–7.7)
Neutrophils Relative %: 62 % (ref 43–77)
Platelets: 153 10*3/uL (ref 150–400)
RBC: 4.93 MIL/uL (ref 3.87–5.11)
RDW: 14 % (ref 11.5–15.5)
WBC: 5.3 10*3/uL (ref 4.0–10.5)

## 2014-08-24 LAB — POC URINE PREG, ED: PREG TEST UR: NEGATIVE

## 2014-08-24 LAB — I-STAT TROPONIN, ED: Troponin i, poc: 0 ng/mL (ref 0.00–0.08)

## 2014-08-24 LAB — BRAIN NATRIURETIC PEPTIDE: B NATRIURETIC PEPTIDE 5: 8.8 pg/mL (ref 0.0–100.0)

## 2014-08-24 MED ORDER — ALBUTEROL SULFATE (2.5 MG/3ML) 0.083% IN NEBU
5.0000 mg | INHALATION_SOLUTION | Freq: Once | RESPIRATORY_TRACT | Status: AC
  Administered 2014-08-24: 5 mg via RESPIRATORY_TRACT
  Filled 2014-08-24: qty 6

## 2014-08-24 MED ORDER — IPRATROPIUM BROMIDE 0.02 % IN SOLN
0.5000 mg | Freq: Once | RESPIRATORY_TRACT | Status: AC
  Administered 2014-08-24: 0.5 mg via RESPIRATORY_TRACT
  Filled 2014-08-24: qty 2.5

## 2014-08-24 MED ORDER — PREDNISONE 50 MG PO TABS: 50.0000 mg | ORAL_TABLET | Freq: Every day | ORAL | Status: DC

## 2014-08-24 MED ORDER — PREDNISONE 20 MG PO TABS
60.0000 mg | ORAL_TABLET | Freq: Once | ORAL | Status: AC
  Administered 2014-08-24: 60 mg via ORAL
  Filled 2014-08-24: qty 3

## 2014-08-24 NOTE — ED Provider Notes (Signed)
CSN: [id number]     Arrival date & time 08/24/14  1605 History   First MD Initiated Contact with Patient 08/24/14 1755     Chief Complaint  Patient presents with  . Shortness of Breath     (Consider location/radiation/quality/duration/timing/severity/associated sxs/prior Treatment) HPI  Pt with hx of asthma, HTN, DM presenting with shortness of breath and cough. Pt states her symptoms began approx 2 weeks ago- she has been using albuterol multiple times per day but continues to be short of breath.  No leg swelling. No chest pain.  No fever/chills- but has been having sweats at night.  She states her albuterol is not helping her sob.  Cough is nonproductive.  Last steroids was approx 3 months ago with hospitalizations. Denies hx of intubations.  Per chart review she had normal Echo 11/15 with EF 60%. There are no other associated systemic symptoms, there are no other alleviating or modifying factors.   Past Medical History  Diagnosis Date  . Diabetes mellitus   . Hypertension   . Hyperlipidemia   . Depression   . PTSD (post-traumatic stress disorder)   . Pain in joint, ankle and foot 02/17/2013  . Anxiety   . Bipolar 1 disorder   . Sleep apnea     uses CPAP intermittently, pt. doesn't remember where she had the study  . Asthma     has been eval. by Dr. Gwenette Greet- last 06/2014  . Pneumonia     hosp. for a couple of day- not sure when it was   . GERD (gastroesophageal reflux disease)   . Headache     sometimes has migraines   . Arthritis     osteoarthritis    Past Surgical History  Procedure Laterality Date  . Tubal ligation    . Midtarsal arthrodesis Right 07/17/12    Rt foot  . Remove implant deep Right 07/17/12    Rt foot  . Lapidus fusion Right 01/10/12    Rt foot  . Carpal tunnel release Left 2001    Left wrist  . Gastroc recession extremity Right 3//20/14    Right foot  . Hernia repair      1996  . Remove implant deep Right 07/22/2014    Rt foot @ [facility]   Family History   Problem Relation Age of Onset  . Heart attack Father   . Kidney disease Mother    History  Substance Use Topics  . Smoking status: Former Smoker -- 2.00 packs/day for 6 years    Types: Cigarettes    Quit date: 06/18/1978  . Smokeless tobacco: Never Used  . Alcohol Use: 0.0 oz/week    0 Standard drinks or equivalent per week     Comment: weekend- use of beer    OB History    No data available     Review of Systems  ROS reviewed and all otherwise negative except for mentioned in HPI    Allergies  Celery oil; Pork-derived products; and Latex  Home Medications   Prior to Admission medications   Medication Sig Start Date End Date Taking? Authorizing Provider  albuterol (PROVENTIL) (2.5 MG/3ML) 0.083% nebulizer solution Take 2.5 mg by nebulization every 6 (six) hours as needed for wheezing or shortness of breath.    Historical Provider, MD  amLODipine (NORVASC) 10 MG tablet Take 10 mg by mouth daily.     Historical Provider, MD  atenolol (TENORMIN) 50 MG tablet Take 50 mg by mouth daily.     Historical  Provider, MD  beclomethasone (QVAR) 80 MCG/ACT inhaler Inhale 2 puffs into the lungs 2 (two) times daily. 07/05/14   Kathee Delton, MD  budesonide-formoterol [facility]) 160-4.5 MCG/ACT inhaler Inhale 2 puffs into the lungs 2 (two) times daily.    Historical Provider, MD  cyclobenzaprine (FLEXERIL) 10 MG tablet Take 1 tablet (10 mg total) by mouth 2 (two) times daily as needed for muscle spasms. Patient not taking: Reported on 08/20/2014 05/13/14   Elmyra Ricks Pisciotta, PA-C  DULoxetine (CYMBALTA) 60 MG capsule Take 60 mg by mouth at bedtime.     Historical Provider, MD  Fluticasone-Salmeterol (ADVAIR DISKUS) 100-50 MCG/DOSE AEPB Inhale 1 puff into the lungs 2 (two) times daily. Patient not taking: Reported on 08/20/2014 06/09/14   Kathee Delton, MD  insulin NPH-regular Human (NOVOLIN 70/30) (70-30) 100 UNIT/ML injection Inject 30 Units into the skin 2 (two) times daily with a meal.     Historical Provider, MD  lisinopril (PRINIVIL,ZESTRIL) 20 MG tablet Take 20 mg by mouth daily.     Historical Provider, MD  olmesartan (BENICAR) 20 MG tablet Take 1 tablet (20 mg total) by mouth daily. Patient not taking: Reported on 08/20/2014 06/09/14   Kathee Delton, MD  omeprazole (PRILOSEC) 20 MG capsule Take 20 mg by mouth daily.     Historical Provider, MD  ondansetron (ZOFRAN ODT) 8 MG disintegrating tablet Take 1 tablet (8 mg total) by mouth every 8 (eight) hours as needed for nausea or vomiting. 01/28/14   Sherwood Gambler, MD  oxyCODONE-acetaminophen (PERCOCET) 10-325 MG per tablet Take 1 tablet by mouth every 6 (six) hours as needed for pain. 08/03/14   Myeong O Sheard, DPM  predniSONE (DELTASONE) 50 MG tablet Take 1 tablet (50 mg total) by mouth daily. 08/24/14   Alfonzo Beers, MD  tiZANidine (ZANAFLEX) 4 MG tablet Take 4 mg by mouth every 8 (eight) hours as needed for muscle spasms.    Historical Provider, MD  trimethoprim-polymyxin b (POLYTRIM) ophthalmic solution Place 1 drop into the left eye every 4 (four) hours. Patient not taking: Reported on 08/20/2014 02/19/14   Donne Hazel, MD   BP 158/99 mmHg  Pulse 92  Temp(Src) 97.9 F (36.6 C) (Oral)  Resp 22  Ht 5\' 6"  (1.676 m)  Wt 280 lb (127.007 kg)  BMI 45.21 kg/m2  SpO2 97%  LMP 08/07/2014  Vitals reviewed Physical Exam  Physical Examination: General appearance - alert, well appearing, and in no distress Mental status - alert, oriented to person, place, and time Eyes - no conjunctival injection, no scleral icterus Mouth - mucous membranes moist, pharynx normal without lesions Chest -  BSS, bilateral expiratory wheezing, slight increased respiratory effort, no retractions, speaking in full sentences Heart - normal rate, regular rhythm, normal S1, S2, no murmurs, rubs, clicks or gallops Abdomen - soft, nontender, nondistended, no masses or organomegaly Extremities - peripheral pulses normal, no pedal edema, no clubbing or  cyanosis Skin - normal coloration and turgor, no rashes  ED Course  Procedures (including critical care time) Labs Review Labs Reviewed  I-STAT CHEM 8, ED - Abnormal; Notable for the following:    Glucose, Bld 219 (*)    Hemoglobin 16.3 (*)    HCT 48.0 (*)    All other components within normal limits  CBC WITH DIFFERENTIAL/PLATELET  BRAIN NATRIURETIC PEPTIDE  POC URINE PREG, ED  Randolm Idol, ED    Imaging Review Dg Chest 2 View  08/24/2014   CLINICAL DATA:  44 year old female with cough,  wheezing, shortness of breath and headache for the past 2 weeks accompanied by unintentional weight loss.  EXAM: CHEST  2 VIEW  COMPARISON:  Prior chest x-ray 08/30/2014  FINDINGS: Mild cardiomegaly with left ventricular prominence similar compared to prior. No pulmonary edema, focal airspace consolidation, pleural effusion or pneumothorax. No acute osseous abnormality.  IMPRESSION: Stable borderline cardiomegaly with left ventricular prominence.  Otherwise, no acute cardiopulmonary process.   Electronically Signed   By: Jacqulynn Cadet M.D.   On: 08/24/2014 17:02     EKG Interpretation None      Date: 08/24/2014  Rate: 90  Rhythm: normal sinus rhythm  QRS Axis: normal  Intervals: normal  ST/T Wave abnormalities: normal  Conduction Disutrbances:none  Narrative Interpretation:   Old EKG Reviewed: none available EKg not available in EPIC for interpretation into [facility]   Final diagnoses:  Asthma exacerbation    Pt presenting with 2 weeks of wheezing and cough.  Pt improved after 3 duonebs and steroids.  EKG and troponin reassuring. No elevation in BNP, CXR does not show any fluid overload or pneumonia.   Xray images reviewed and interpreted by me as well.  Discharged with strict return precautions.  Pt agreeable with plan.    Alfonzo Beers, MD 08/24/14 2231

## 2014-08-24 NOTE — ED Notes (Signed)
Pt verbalized understanding of d/c instructions, prescriptions, and follow up appointment with dr bonsu in 2 days and has no further questions.

## 2014-08-24 NOTE — ED Notes (Signed)
Spoke with Lab and BNP to result asap.

## 2014-08-24 NOTE — ED Notes (Signed)
Phlebotomy and this RN unable to obtain blood at this time

## 2014-08-24 NOTE — Discharge Instructions (Signed)
Return to the ED with any concerns including difficulty breathing despite using albuterol every 4 hours, not drinking fluids, decreased urine output, vomiting and not able to keep down liquids or medications, decreased level of alertness/lethargy, or any other alarming symptoms °

## 2014-08-24 NOTE — ED Notes (Signed)
Pt to ED with c/o shortness of breath x's 4 days.  St's she has used her inhaler without relief.  Also c/o nonproductive cough and not being able to sleep at night due to cough.  St's has used several OTC meds without relief.

## 2014-08-25 ENCOUNTER — Encounter: Payer: Medicare Other | Admitting: Podiatry

## 2014-09-02 MED ORDER — DEXTROSE 5 % IV SOLN
3.0000 g | INTRAVENOUS | Status: DC
  Filled 2014-09-02: qty 3000

## 2014-09-03 ENCOUNTER — Encounter (HOSPITAL_COMMUNITY)
Admission: RE | Disposition: A | Discharge status: Home / Self Care | Payer: Self-pay | Source: Ambulatory Visit | Attending: Internal Medicine

## 2014-09-03 ENCOUNTER — Inpatient Hospital Stay (HOSPITAL_COMMUNITY): Payer: Medicare Other | Admitting: Anesthesiology

## 2014-09-03 ENCOUNTER — Inpatient Hospital Stay (HOSPITAL_COMMUNITY): Payer: Medicare Other

## 2014-09-03 ENCOUNTER — Encounter (HOSPITAL_COMMUNITY): Payer: Self-pay | Admitting: *Deleted

## 2014-09-03 ENCOUNTER — Inpatient Hospital Stay (HOSPITAL_COMMUNITY)
Admission: RE | Admit: 2014-09-03 | Discharge: 2014-09-10 | DRG: 551 | Disposition: A | Discharge status: Home / Self Care | Payer: Medicare Other | Source: Ambulatory Visit | Attending: Internal Medicine | Admitting: Internal Medicine

## 2014-09-03 ENCOUNTER — Inpatient Hospital Stay (HOSPITAL_COMMUNITY): Payer: Medicare Other | Admitting: Vascular Surgery

## 2014-09-03 DIAGNOSIS — R748 Abnormal levels of other serum enzymes: Secondary | ICD-10-CM

## 2014-09-03 DIAGNOSIS — F4310 Post-traumatic stress disorder, unspecified: Secondary | ICD-10-CM | POA: Diagnosis present

## 2014-09-03 DIAGNOSIS — M79602 Pain in left arm: Secondary | ICD-10-CM | POA: Diagnosis not present

## 2014-09-03 DIAGNOSIS — J45909 Unspecified asthma, uncomplicated: Secondary | ICD-10-CM | POA: Diagnosis present

## 2014-09-03 DIAGNOSIS — G9341 Metabolic encephalopathy: Secondary | ICD-10-CM | POA: Diagnosis present

## 2014-09-03 DIAGNOSIS — M1990 Unspecified osteoarthritis, unspecified site: Secondary | ICD-10-CM | POA: Diagnosis present

## 2014-09-03 DIAGNOSIS — K760 Fatty (change of) liver, not elsewhere classified: Secondary | ICD-10-CM | POA: Diagnosis present

## 2014-09-03 DIAGNOSIS — E7220 Disorder of urea cycle metabolism, unspecified: Secondary | ICD-10-CM | POA: Diagnosis not present

## 2014-09-03 DIAGNOSIS — Z91018 Allergy to other foods: Secondary | ICD-10-CM

## 2014-09-03 DIAGNOSIS — I10 Essential (primary) hypertension: Secondary | ICD-10-CM | POA: Diagnosis present

## 2014-09-03 DIAGNOSIS — K219 Gastro-esophageal reflux disease without esophagitis: Secondary | ICD-10-CM | POA: Diagnosis present

## 2014-09-03 DIAGNOSIS — G4733 Obstructive sleep apnea (adult) (pediatric): Secondary | ICD-10-CM | POA: Diagnosis not present

## 2014-09-03 DIAGNOSIS — E669 Obesity, unspecified: Secondary | ICD-10-CM | POA: Diagnosis present

## 2014-09-03 DIAGNOSIS — Z5309 Procedure and treatment not carried out because of other contraindication: Secondary | ICD-10-CM | POA: Diagnosis not present

## 2014-09-03 DIAGNOSIS — R5383 Other fatigue: Secondary | ICD-10-CM | POA: Diagnosis not present

## 2014-09-03 DIAGNOSIS — J810 Acute pulmonary edema: Secondary | ICD-10-CM | POA: Diagnosis not present

## 2014-09-03 DIAGNOSIS — T8859XA Other complications of anesthesia, initial encounter: Secondary | ICD-10-CM | POA: Diagnosis not present

## 2014-09-03 DIAGNOSIS — F339 Major depressive disorder, recurrent, unspecified: Secondary | ICD-10-CM

## 2014-09-03 DIAGNOSIS — J45901 Unspecified asthma with (acute) exacerbation: Secondary | ICD-10-CM

## 2014-09-03 DIAGNOSIS — R0902 Hypoxemia: Secondary | ICD-10-CM | POA: Diagnosis not present

## 2014-09-03 DIAGNOSIS — G9340 Encephalopathy, unspecified: Secondary | ICD-10-CM | POA: Diagnosis not present

## 2014-09-03 DIAGNOSIS — Z91040 Latex allergy status: Secondary | ICD-10-CM

## 2014-09-03 DIAGNOSIS — I674 Hypertensive encephalopathy: Secondary | ICD-10-CM | POA: Diagnosis present

## 2014-09-03 DIAGNOSIS — Z23 Encounter for immunization: Secondary | ICD-10-CM

## 2014-09-03 DIAGNOSIS — M4712 Other spondylosis with myelopathy, cervical region: Principal | ICD-10-CM | POA: Diagnosis present

## 2014-09-03 DIAGNOSIS — M542 Cervicalgia: Secondary | ICD-10-CM | POA: Diagnosis present

## 2014-09-03 DIAGNOSIS — F061 Catatonic disorder due to known physiological condition: Secondary | ICD-10-CM | POA: Diagnosis present

## 2014-09-03 DIAGNOSIS — E785 Hyperlipidemia, unspecified: Secondary | ICD-10-CM | POA: Diagnosis present

## 2014-09-03 DIAGNOSIS — Z6841 Body Mass Index (BMI) 40.0 and over, adult: Secondary | ICD-10-CM | POA: Diagnosis not present

## 2014-09-03 DIAGNOSIS — Z794 Long term (current) use of insulin: Secondary | ICD-10-CM

## 2014-09-03 DIAGNOSIS — F1010 Alcohol abuse, uncomplicated: Secondary | ICD-10-CM | POA: Diagnosis present

## 2014-09-03 DIAGNOSIS — R0600 Dyspnea, unspecified: Secondary | ICD-10-CM | POA: Diagnosis not present

## 2014-09-03 DIAGNOSIS — Z7952 Long term (current) use of systemic steroids: Secondary | ICD-10-CM | POA: Diagnosis not present

## 2014-09-03 DIAGNOSIS — R51 Headache: Secondary | ICD-10-CM | POA: Diagnosis not present

## 2014-09-03 DIAGNOSIS — R29898 Other symptoms and signs involving the musculoskeletal system: Secondary | ICD-10-CM | POA: Diagnosis present

## 2014-09-03 DIAGNOSIS — E1165 Type 2 diabetes mellitus with hyperglycemia: Secondary | ICD-10-CM | POA: Diagnosis present

## 2014-09-03 DIAGNOSIS — Z79899 Other long term (current) drug therapy: Secondary | ICD-10-CM

## 2014-09-03 DIAGNOSIS — G441 Vascular headache, not elsewhere classified: Secondary | ICD-10-CM | POA: Diagnosis not present

## 2014-09-03 DIAGNOSIS — Z87891 Personal history of nicotine dependence: Secondary | ICD-10-CM

## 2014-09-03 DIAGNOSIS — R410 Disorientation, unspecified: Secondary | ICD-10-CM | POA: Diagnosis not present

## 2014-09-03 DIAGNOSIS — F319 Bipolar disorder, unspecified: Secondary | ICD-10-CM | POA: Diagnosis present

## 2014-09-03 DIAGNOSIS — E119 Type 2 diabetes mellitus without complications: Secondary | ICD-10-CM | POA: Diagnosis not present

## 2014-09-03 DIAGNOSIS — F419 Anxiety disorder, unspecified: Secondary | ICD-10-CM | POA: Diagnosis present

## 2014-09-03 DIAGNOSIS — J811 Chronic pulmonary edema: Secondary | ICD-10-CM

## 2014-09-03 DIAGNOSIS — R0989 Other specified symptoms and signs involving the circulatory and respiratory systems: Secondary | ICD-10-CM | POA: Diagnosis not present

## 2014-09-03 DIAGNOSIS — R519 Headache, unspecified: Secondary | ICD-10-CM

## 2014-09-03 DIAGNOSIS — M79603 Pain in arm, unspecified: Secondary | ICD-10-CM | POA: Diagnosis present

## 2014-09-03 DIAGNOSIS — I517 Cardiomegaly: Secondary | ICD-10-CM | POA: Diagnosis not present

## 2014-09-03 LAB — COMPREHENSIVE METABOLIC PANEL
ALK PHOS: 75 U/L (ref 39–117)
ALT: 79 U/L — ABNORMAL HIGH (ref 0–35)
AST: 90 U/L — ABNORMAL HIGH (ref 0–37)
Albumin: 3.6 g/dL (ref 3.5–5.2)
Anion gap: 7 (ref 5–15)
CALCIUM: 8.7 mg/dL (ref 8.4–10.5)
CO2: 25 mmol/L (ref 19–32)
Chloride: 104 mmol/L (ref 96–112)
Creatinine, Ser: 1.08 mg/dL (ref 0.50–1.10)
GFR calc non Af Amer: 61 mL/min — ABNORMAL LOW (ref >90)
GFR, EST AFRICAN AMERICAN: 71 mL/min — AB (ref >90)
GLUCOSE: 227 mg/dL — AB (ref 70–99)
POTASSIUM: 4.2 mmol/L (ref 3.5–5.1)
Sodium: 136 mmol/L (ref 135–145)
Total Bilirubin: 0.7 mg/dL (ref 0.3–1.2)
Total Protein: 6.6 g/dL (ref 6.0–8.3)

## 2014-09-03 LAB — CBC
HCT: 40.1 % (ref 36.0–46.0)
Hemoglobin: 13.2 g/dL (ref 12.0–15.0)
MCH: 28.1 pg (ref 26.0–34.0)
MCHC: 32.9 g/dL (ref 30.0–36.0)
MCV: 85.3 fL (ref 78.0–100.0)
PLATELETS: 206 10*3/uL (ref 150–400)
RBC: 4.7 MIL/uL (ref 3.87–5.11)
RDW: 13.8 % (ref 11.5–15.5)
WBC: 11.4 10*3/uL — AB (ref 4.0–10.5)

## 2014-09-03 LAB — CREATININE, SERUM
CREATININE: 1.04 mg/dL (ref 0.50–1.10)
GFR calc Af Amer: 74 mL/min — ABNORMAL LOW (ref >90)
GFR, EST NON AFRICAN AMERICAN: 64 mL/min — AB (ref >90)

## 2014-09-03 LAB — RAPID URINE DRUG SCREEN, HOSP PERFORMED
Amphetamines: NOT DETECTED
Barbiturates: NOT DETECTED
Benzodiazepines: POSITIVE — AB
COCAINE: NOT DETECTED
Opiates: NOT DETECTED
Tetrahydrocannabinol: NOT DETECTED

## 2014-09-03 LAB — POCT I-STAT 7, (LYTES, BLD GAS, ICA,H+H)
Acid-Base Excess: 1 mmol/L (ref 0.0–2.0)
Bicarbonate: 27.4 mEq/L — ABNORMAL HIGH (ref 20.0–24.0)
Calcium, Ion: 1.13 mmol/L (ref 1.12–1.23)
HCT: 40 % (ref 36.0–46.0)
Hemoglobin: 13.6 g/dL (ref 12.0–15.0)
O2 Saturation: 100 %
Patient temperature: 36
Potassium: 4.3 mmol/L (ref 3.5–5.1)
Sodium: 135 mmol/L (ref 135–145)
TCO2: 29 mmol/L (ref 0–100)
pCO2 arterial: 49.2 mmHg — ABNORMAL HIGH (ref 35.0–45.0)
pH, Arterial: 7.349 — ABNORMAL LOW (ref 7.350–7.450)
pO2, Arterial: 332 mmHg — ABNORMAL HIGH (ref 80.0–100.0)

## 2014-09-03 LAB — TROPONIN I: Troponin I: <0.03 ng/mL (ref <0.031)

## 2014-09-03 LAB — CK TOTAL AND CKMB (NOT AT ARMC)
CK, MB: 1.8 ng/mL (ref 0.3–4.0)
Relative Index: 1.4 (ref 0.0–2.5)
Total CK: 125 U/L (ref 7–177)

## 2014-09-03 LAB — GLUCOSE, CAPILLARY
GLUCOSE-CAPILLARY: 154 mg/dL — AB (ref 70–99)
Glucose-Capillary: 202 mg/dL — ABNORMAL HIGH (ref 70–99)
Glucose-Capillary: 144 mg/dL — ABNORMAL HIGH (ref 70–99)

## 2014-09-03 LAB — TSH: TSH: 0.698 u[IU]/mL (ref 0.350–4.500)

## 2014-09-03 SURGERY — CANCELLED PROCEDURE
Anesthesia: General

## 2014-09-03 MED ORDER — LIDOCAINE HCL (CARDIAC) 20 MG/ML IV SOLN
INTRAVENOUS | Status: DC | PRN
  Administered 2014-09-03: 100 mg via INTRAVENOUS

## 2014-09-03 MED ORDER — NEOSTIGMINE METHYLSULFATE 10 MG/10ML IV SOLN
INTRAVENOUS | Status: AC
  Filled 2014-09-03: qty 3

## 2014-09-03 MED ORDER — MORPHINE SULFATE 2 MG/ML IJ SOLN
2.0000 mg | INTRAMUSCULAR | Status: DC | PRN
  Administered 2014-09-03: 2 mg via INTRAVENOUS
  Filled 2014-09-03: qty 1

## 2014-09-03 MED ORDER — PANTOPRAZOLE SODIUM 40 MG PO TBEC
40.0000 mg | DELAYED_RELEASE_TABLET | Freq: Every day | ORAL | Status: DC
Start: 2014-09-04 — End: 2014-09-10
  Administered 2014-09-04 – 2014-09-10 (×7): 40 mg via ORAL
  Filled 2014-09-03 (×7): qty 1

## 2014-09-03 MED ORDER — SUCCINYLCHOLINE CHLORIDE 20 MG/ML IJ SOLN
INTRAMUSCULAR | Status: AC
  Filled 2014-09-03: qty 1

## 2014-09-03 MED ORDER — ESMOLOL HCL 10 MG/ML IV SOLN
INTRAVENOUS | Status: DC | PRN
  Administered 2014-09-03: 20 mg via INTRAVENOUS
  Administered 2014-09-03 (×2): 10 mg via INTRAVENOUS

## 2014-09-03 MED ORDER — PROPOFOL 10 MG/ML IV BOLUS
INTRAVENOUS | Status: AC
  Filled 2014-09-03: qty 20

## 2014-09-03 MED ORDER — LISINOPRIL 20 MG PO TABS
20.0000 mg | ORAL_TABLET | Freq: Every day | ORAL | Status: DC
Start: 2014-09-04 — End: 2014-09-10
  Administered 2014-09-04 – 2014-09-10 (×7): 20 mg via ORAL
  Filled 2014-09-03 (×7): qty 1

## 2014-09-03 MED ORDER — HEMOSTATIC AGENTS (NO CHARGE) OPTIME: TOPICAL | Status: DC | PRN

## 2014-09-03 MED ORDER — DULOXETINE HCL 60 MG PO CPEP
60.0000 mg | ORAL_CAPSULE | Freq: Every day | ORAL | Status: DC
  Administered 2014-09-04 – 2014-09-09 (×6): 60 mg via ORAL
  Filled 2014-09-03 (×7): qty 1

## 2014-09-03 MED ORDER — ONDANSETRON HCL 4 MG/2ML IJ SOLN
INTRAMUSCULAR | Status: AC
  Filled 2014-09-03: qty 2

## 2014-09-03 MED ORDER — SODIUM CHLORIDE 0.9 % IV SOLN
10.0000 mg | INTRAVENOUS | Status: DC | PRN
  Administered 2014-09-03: 20 ug/min via INTRAVENOUS

## 2014-09-03 MED ORDER — INSULIN ASPART 100 UNIT/ML ~~LOC~~ SOLN
0.0000 [IU] | Freq: Three times a day (TID) | SUBCUTANEOUS | Status: DC
  Administered 2014-09-04 (×2): 4 [IU] via SUBCUTANEOUS
  Administered 2014-09-04: 3 [IU] via SUBCUTANEOUS
  Administered 2014-09-05: 4 [IU] via SUBCUTANEOUS
  Administered 2014-09-05: 3 [IU] via SUBCUTANEOUS
  Administered 2014-09-05: 4 [IU] via SUBCUTANEOUS
  Administered 2014-09-06 – 2014-09-07 (×4): 3 [IU] via SUBCUTANEOUS
  Administered 2014-09-07 – 2014-09-08 (×2): 4 [IU] via SUBCUTANEOUS
  Administered 2014-09-08: 3 [IU] via SUBCUTANEOUS
  Administered 2014-09-08: 4 [IU] via SUBCUTANEOUS
  Administered 2014-09-09: 1 [IU] via SUBCUTANEOUS
  Administered 2014-09-09 (×2): 3 [IU] via SUBCUTANEOUS
  Administered 2014-09-10: 7 [IU] via SUBCUTANEOUS
  Administered 2014-09-10 (×2): 3 [IU] via SUBCUTANEOUS

## 2014-09-03 MED ORDER — MIDAZOLAM HCL 5 MG/5ML IJ SOLN
INTRAMUSCULAR | Status: DC | PRN
  Administered 2014-09-03: 2 mg via INTRAVENOUS

## 2014-09-03 MED ORDER — SODIUM CHLORIDE 0.9 % IJ SOLN
INTRAMUSCULAR | Status: AC
  Filled 2014-09-03: qty 10

## 2014-09-03 MED ORDER — NICARDIPINE HCL IN NACL 20-0.86 MG/200ML-% IV SOLN
INTRAVENOUS | Status: AC
  Administered 2014-09-03: 12.5 mg/h via INTRAVENOUS
  Filled 2014-09-03: qty 200

## 2014-09-03 MED ORDER — LACTATED RINGERS IV SOLN
INTRAVENOUS | Status: DC | PRN
  Administered 2014-09-03 (×3): via INTRAVENOUS

## 2014-09-03 MED ORDER — THROMBIN 5000 UNITS EX SOLR: CUTANEOUS | Status: DC | PRN

## 2014-09-03 MED ORDER — NICARDIPINE HCL IN NACL 20-0.86 MG/200ML-% IV SOLN
3.0000 mg/h | INTRAVENOUS | Status: DC
  Administered 2014-09-03 (×3): 7.5 mg/h via INTRAVENOUS
  Administered 2014-09-03: 5 mg/h via INTRAVENOUS
  Filled 2014-09-03 (×6): qty 200

## 2014-09-03 MED ORDER — FLUTICASONE PROPIONATE HFA 44 MCG/ACT IN AERO
2.0000 | INHALATION_SPRAY | Freq: Two times a day (BID) | RESPIRATORY_TRACT | Status: DC
  Filled 2014-09-03: qty 10.6

## 2014-09-03 MED ORDER — FENTANYL CITRATE 0.05 MG/ML IJ SOLN
INTRAMUSCULAR | Status: AC
  Filled 2014-09-03: qty 5

## 2014-09-03 MED ORDER — BUPIVACAINE HCL 0.5 % IJ SOLN: INTRAMUSCULAR | Status: DC | PRN

## 2014-09-03 MED ORDER — FENTANYL CITRATE 0.05 MG/ML IJ SOLN: 25.0000 ug | INTRAMUSCULAR | Status: DC | PRN

## 2014-09-03 MED ORDER — HEPARIN SODIUM (PORCINE) 5000 UNIT/ML IJ SOLN
5000.0000 [IU] | Freq: Three times a day (TID) | INTRAMUSCULAR | Status: DC
  Administered 2014-09-03 – 2014-09-10 (×21): 5000 [IU] via SUBCUTANEOUS
  Filled 2014-09-03 (×22): qty 1

## 2014-09-03 MED ORDER — PROMETHAZINE HCL 25 MG/ML IJ SOLN: 6.2500 mg | INTRAMUSCULAR | Status: DC | PRN

## 2014-09-03 MED ORDER — EPHEDRINE SULFATE 50 MG/ML IJ SOLN
INTRAMUSCULAR | Status: AC
  Filled 2014-09-03: qty 1

## 2014-09-03 MED ORDER — ALBUTEROL SULFATE HFA 108 (90 BASE) MCG/ACT IN AERS: 2.0000 | INHALATION_SPRAY | Freq: Four times a day (QID) | RESPIRATORY_TRACT | Status: DC | PRN

## 2014-09-03 MED ORDER — DOCUSATE SODIUM 100 MG PO CAPS
100.0000 mg | ORAL_CAPSULE | Freq: Two times a day (BID) | ORAL | Status: DC
  Administered 2014-09-03 – 2014-09-10 (×13): 100 mg via ORAL
  Filled 2014-09-03 (×15): qty 1

## 2014-09-03 MED ORDER — GLYCOPYRROLATE 0.2 MG/ML IJ SOLN
INTRAMUSCULAR | Status: DC | PRN
  Administered 2014-09-03: .8 mg via INTRAVENOUS

## 2014-09-03 MED ORDER — SODIUM CHLORIDE 0.9 % IR SOLN: Status: DC | PRN

## 2014-09-03 MED ORDER — ONDANSETRON HCL 4 MG/2ML IJ SOLN
4.0000 mg | Freq: Once | INTRAMUSCULAR | Status: AC
  Administered 2014-09-03: 4 mg via INTRAVENOUS

## 2014-09-03 MED ORDER — AMLODIPINE BESYLATE 10 MG PO TABS
10.0000 mg | ORAL_TABLET | Freq: Every day | ORAL | Status: DC
  Administered 2014-09-04 – 2014-09-10 (×7): 10 mg via ORAL
  Filled 2014-09-03 (×7): qty 1

## 2014-09-03 MED ORDER — BUDESONIDE-FORMOTEROL FUMARATE 160-4.5 MCG/ACT IN AERO
2.0000 | INHALATION_SPRAY | Freq: Two times a day (BID) | RESPIRATORY_TRACT | Status: DC
  Administered 2014-09-04 – 2014-09-10 (×11): 2 via RESPIRATORY_TRACT
  Filled 2014-09-03 (×2): qty 6

## 2014-09-03 MED ORDER — FENTANYL CITRATE 0.05 MG/ML IJ SOLN
INTRAMUSCULAR | Status: DC | PRN
  Administered 2014-09-03: 150 ug via INTRAVENOUS
  Administered 2014-09-03: 100 ug via INTRAVENOUS

## 2014-09-03 MED ORDER — LIDOCAINE-EPINEPHRINE 1 %-1:100000 IJ SOLN: INTRAMUSCULAR | Status: DC | PRN

## 2014-09-03 MED ORDER — ONDANSETRON 4 MG PO TBDP
8.0000 mg | ORAL_TABLET | Freq: Three times a day (TID) | ORAL | Status: DC | PRN
  Administered 2014-09-05: 8 mg via ORAL
  Filled 2014-09-03 (×2): qty 1

## 2014-09-03 MED ORDER — NEOSTIGMINE METHYLSULFATE 10 MG/10ML IV SOLN
INTRAVENOUS | Status: DC | PRN
  Administered 2014-09-03: 5 mg via INTRAVENOUS

## 2014-09-03 MED ORDER — NITROGLYCERIN 0.2 MG/ML ON CALL CATH LAB
INTRAVENOUS | Status: DC | PRN
  Administered 2014-09-03 (×2): 160 ug via INTRAVENOUS
  Administered 2014-09-03: 40 ug via INTRAVENOUS
  Administered 2014-09-03: 160 ug via INTRAVENOUS

## 2014-09-03 MED ORDER — PROPOFOL 10 MG/ML IV BOLUS
INTRAVENOUS | Status: DC | PRN
  Administered 2014-09-03: 200 mg via INTRAVENOUS

## 2014-09-03 MED ORDER — SENNA 8.6 MG PO TABS
1.0000 | ORAL_TABLET | Freq: Two times a day (BID) | ORAL | Status: DC
  Administered 2014-09-03 – 2014-09-10 (×10): 8.6 mg via ORAL
  Filled 2014-09-03 (×13): qty 1

## 2014-09-03 MED ORDER — MEPERIDINE HCL 25 MG/ML IJ SOLN: 6.2500 mg | INTRAMUSCULAR | Status: DC | PRN

## 2014-09-03 MED ORDER — SODIUM CHLORIDE 0.9 % IV SOLN
INTRAVENOUS | Status: DC
  Administered 2014-09-03: 22:00:00 via INTRAVENOUS

## 2014-09-03 MED ORDER — NITROGLYCERIN IN D5W 200-5 MCG/ML-% IV SOLN
INTRAVENOUS | Status: DC | PRN
  Administered 2014-09-03: 20 ug/min via INTRAVENOUS

## 2014-09-03 MED ORDER — ROCURONIUM BROMIDE 100 MG/10ML IV SOLN
INTRAVENOUS | Status: DC | PRN
  Administered 2014-09-03: 50 mg via INTRAVENOUS

## 2014-09-03 MED ORDER — ATENOLOL 25 MG PO TABS
50.0000 mg | ORAL_TABLET | Freq: Every day | ORAL | Status: DC
  Administered 2014-09-04 – 2014-09-10 (×7): 50 mg via ORAL
  Filled 2014-09-03 (×4): qty 2
  Filled 2014-09-03: qty 1
  Filled 2014-09-03: qty 2
  Filled 2014-09-03: qty 1

## 2014-09-03 MED ORDER — TIZANIDINE HCL 4 MG PO TABS
4.0000 mg | ORAL_TABLET | Freq: Three times a day (TID) | ORAL | Status: DC | PRN
  Administered 2014-09-04: 4 mg via ORAL
  Filled 2014-09-03 (×2): qty 1

## 2014-09-03 MED ORDER — ROCURONIUM BROMIDE 50 MG/5ML IV SOLN
INTRAVENOUS | Status: AC
  Filled 2014-09-03: qty 1

## 2014-09-03 MED ORDER — HYDRALAZINE HCL 20 MG/ML IJ SOLN
INTRAMUSCULAR | Status: DC | PRN
  Administered 2014-09-03 (×2): 10 mg via INTRAVENOUS

## 2014-09-03 MED ORDER — NICARDIPINE HCL IN NACL 20-0.86 MG/200ML-% IV SOLN
3.0000 mg/h | INTRAVENOUS | Status: DC
  Administered 2014-09-03: 15 mg/h via INTRAVENOUS
  Administered 2014-09-03: 12.5 mg/h via INTRAVENOUS
  Administered 2014-09-03: 10 mg/h via INTRAVENOUS
  Filled 2014-09-03 (×3): qty 200

## 2014-09-03 MED ORDER — HYDROCODONE-ACETAMINOPHEN 5-325 MG PO TABS
1.0000 | ORAL_TABLET | ORAL | Status: DC | PRN
  Administered 2014-09-04: 1 via ORAL
  Administered 2014-09-05: 2 via ORAL
  Administered 2014-09-05: 1 via ORAL
  Administered 2014-09-06 (×3): 2 via ORAL
  Administered 2014-09-07 – 2014-09-08 (×2): 1 via ORAL
  Administered 2014-09-08 – 2014-09-10 (×4): 2 via ORAL
  Filled 2014-09-03 (×2): qty 2
  Filled 2014-09-03 (×2): qty 1
  Filled 2014-09-03 (×5): qty 2
  Filled 2014-09-03: qty 1
  Filled 2014-09-03 (×2): qty 2
  Filled 2014-09-03: qty 1

## 2014-09-03 MED ORDER — MIDAZOLAM HCL 2 MG/2ML IJ SOLN
INTRAMUSCULAR | Status: AC
  Filled 2014-09-03: qty 2

## 2014-09-03 MED ORDER — ALBUTEROL SULFATE (2.5 MG/3ML) 0.083% IN NEBU: 2.5000 mg | INHALATION_SOLUTION | Freq: Four times a day (QID) | RESPIRATORY_TRACT | Status: DC | PRN

## 2014-09-03 MED ORDER — PHENYLEPHRINE 40 MCG/ML (10ML) SYRINGE FOR IV PUSH (FOR BLOOD PRESSURE SUPPORT)
PREFILLED_SYRINGE | INTRAVENOUS | Status: AC
  Filled 2014-09-03: qty 10

## 2014-09-03 MED ORDER — LIDOCAINE HCL (CARDIAC) 20 MG/ML IV SOLN
INTRAVENOUS | Status: AC
  Filled 2014-09-03: qty 5

## 2014-09-03 MED ORDER — ONDANSETRON HCL 4 MG/2ML IJ SOLN
INTRAMUSCULAR | Status: DC | PRN
  Administered 2014-09-03: 4 mg via INTRAVENOUS

## 2014-09-03 MED ORDER — 0.9 % SODIUM CHLORIDE (POUR BTL) OPTIME: TOPICAL | Status: DC | PRN

## 2014-09-03 MED ORDER — NICARDIPINE HCL IN NACL 20-0.86 MG/200ML-% IV SOLN
INTRAVENOUS | Status: AC
  Filled 2014-09-03: qty 200

## 2014-09-03 SURGICAL SUPPLY — 58 items
BAG DECANTER FOR FLEXI CONT (MISCELLANEOUS) IMPLANT
BENZOIN TINCTURE PRP APPL 2/3 (GAUZE/BANDAGES/DRESSINGS) IMPLANT
BIT DRILL 13 (BIT) IMPLANT
BLADE CLIPPER SURG (BLADE) IMPLANT
BLADE SURG 11 STRL SS (BLADE) IMPLANT
BUR MATCHSTICK NEURO 3.0 LAGG (BURR) IMPLANT
CANISTER SUCT 3000ML PPV (MISCELLANEOUS) IMPLANT
CONT SPEC 4OZ CLIKSEAL STRL BL (MISCELLANEOUS) IMPLANT
COVER TABLE BACK 60X90 (DRAPES) IMPLANT
DECANTER SPIKE VIAL GLASS SM (MISCELLANEOUS) IMPLANT
DRAIN CHANNEL 10M FLAT 3/4 FLT (DRAIN) IMPLANT
DRAPE C-ARM 42X72 X-RAY (DRAPES) IMPLANT
DRAPE LAPAROTOMY 100X72 PEDS (DRAPES) IMPLANT
DRAPE MICROSCOPE LEICA (MISCELLANEOUS) IMPLANT
DRAPE POUCH INSTRU U-SHP 10X18 (DRAPES) IMPLANT
DRAPE PROXIMA HALF (DRAPES) IMPLANT
DRSG OPSITE POSTOP 3X4 (GAUZE/BANDAGES/DRESSINGS) IMPLANT
DRSG TEGADERM 4X4.75 (GAUZE/BANDAGES/DRESSINGS) IMPLANT
DURAPREP 6ML APPLICATOR 50/CS (WOUND CARE) IMPLANT
ELECT COATED BLADE 2.86 ST (ELECTRODE) IMPLANT
ELECT REM PT RETURN 9FT ADLT (ELECTROSURGICAL)
ELECTRODE REM PT RTRN 9FT ADLT (ELECTROSURGICAL) IMPLANT
EVACUATOR SILICONE 100CC (DRAIN) IMPLANT
GAUZE SPONGE 4X4 16PLY XRAY LF (GAUZE/BANDAGES/DRESSINGS) IMPLANT
GLOVE BIOGEL PI IND STRL 7.5 (GLOVE) IMPLANT
GLOVE BIOGEL PI INDICATOR 7.5 (GLOVE)
GLOVE ECLIPSE 7.0 STRL STRAW (GLOVE) IMPLANT
GLOVE EXAM NITRILE LRG STRL (GLOVE) IMPLANT
GLOVE EXAM NITRILE MD LF STRL (GLOVE) IMPLANT
GLOVE EXAM NITRILE XL STR (GLOVE) IMPLANT
GLOVE EXAM NITRILE XS STR PU (GLOVE) IMPLANT
GLOVE SURG SS PI 7.0 STRL IVOR (GLOVE) IMPLANT
GOWN STRL REUS W/ TWL LRG LVL3 (GOWN DISPOSABLE) IMPLANT
GOWN STRL REUS W/ TWL XL LVL3 (GOWN DISPOSABLE) IMPLANT
GOWN STRL REUS W/TWL 2XL LVL3 (GOWN DISPOSABLE) IMPLANT
GOWN STRL REUS W/TWL LRG LVL3 (GOWN DISPOSABLE)
GOWN STRL REUS W/TWL XL LVL3 (GOWN DISPOSABLE)
HEMOSTAT POWDER KIT SURGIFOAM (HEMOSTASIS) IMPLANT
KIT BASIN OR (CUSTOM PROCEDURE TRAY) IMPLANT
KIT ROOM TURNOVER OR (KITS) IMPLANT
LIQUID BAND (GAUZE/BANDAGES/DRESSINGS) IMPLANT
NEEDLE HYPO 25X1 1.5 SAFETY (NEEDLE) IMPLANT
NEEDLE SPNL 22GX3.5 QUINCKE BK (NEEDLE) IMPLANT
NS IRRIG 1000ML POUR BTL (IV SOLUTION) IMPLANT
PACK LAMINECTOMY NEURO (CUSTOM PROCEDURE TRAY) IMPLANT
PAD ARMBOARD 7.5X6 YLW CONV (MISCELLANEOUS) IMPLANT
RUBBERBAND STERILE (MISCELLANEOUS) IMPLANT
SPONGE INTESTINAL PEANUT (DISPOSABLE) IMPLANT
SPONGE SURGIFOAM ABS GEL SZ50 (HEMOSTASIS) IMPLANT
STRIP CLOSURE SKIN 1/2X4 (GAUZE/BANDAGES/DRESSINGS) IMPLANT
SUT ETHILON 3 0 FSL (SUTURE) IMPLANT
SUT VIC AB 3-0 SH 8-18 (SUTURE) IMPLANT
SUT VICRYL 3-0 RB1 18 ABS (SUTURE) IMPLANT
SYR 20ML ECCENTRIC (SYRINGE) IMPLANT
TAPE CLOTH 2X10 TAN LF (GAUZE/BANDAGES/DRESSINGS) IMPLANT
TOWEL OR 17X24 6PK STRL BLUE (TOWEL DISPOSABLE) IMPLANT
TOWEL OR 17X26 10 PK STRL BLUE (TOWEL DISPOSABLE) IMPLANT
WATER STERILE IRR 1000ML POUR (IV SOLUTION) IMPLANT

## 2014-09-03 NOTE — Progress Notes (Signed)
Pt admitted to ICU after unable to perform 1 level ACDF this am because of severe hemodynamic reactivity with minor stimulation. Surgery was therefore canceled and pt extubated. Appreciate PCCM assistance in w/u to identify any possible underlying cause.

## 2014-09-03 NOTE — Progress Notes (Signed)
RT called to neuro PACU for pt needing stat cpap post OR. Pt placed on cpap with 4L O2 bled in. Pt very tachypnic but encouraged to take slow deep breaths. CRNA at bedside. RT will continue to monitor.

## 2014-09-03 NOTE — Anesthesia Postprocedure Evaluation (Signed)
  Anesthesia Post-op Note  Patient: Diana Henderson  Procedure(s) Performed: Procedure(s) with comments: ANTERIOR CERVICAL DECOMPRESSION/DISCECTOMY FUSION 1 LEVEL (N/A) - C56 anterior cervical decompression with fusion interbody prosthesis plating and bonegraft  Patient Location: PACU  Anesthesia Type:General  Level of Consciousness: awake, alert  and oriented  Airway and Oxygen Therapy: Patient Spontanous Breathing and Patient connected to nasal cannula oxygen  Post-op Pain: none  Post-op Assessment: Post-op Vital signs reviewed, Respiratory Function Stable and No signs of Nausea or vomiting  Post-op Vital Signs: Reviewed  Last Vitals:  Filed Vitals:   09/03/14 1200  BP:   Pulse: 119  Temp:   Resp: 27    Complications: cardiovascular complications

## 2014-09-03 NOTE — Progress Notes (Signed)
Pt states she does not wish to wear CPAP tonight. Pt states it was not comfortable when she tried to wear it previously. Pt encouraged to call RT if pt changes mind. Pt also encouraged to have family bring in home mask and/or machine. No distress noted at this time.

## 2014-09-03 NOTE — Consult Note (Addendum)
PULMONARY / CRITICAL CARE MEDICINE   Name: Diana Henderson MRN: [id number] DOB: August 08, 1970    ADMISSION DATE:  09/03/2014 CONSULTATION DATE: 3/18  REFERRING MD :  NS  CHIEF COMPLAINT:  HTN  INITIAL PRESENTATION: For cervical surgery  STUDIES:  3/18 2 d>>  SIGNIFICANT EVENTS: 3/18 cervical disc surgery cancelled due to htn after induction    HISTORY OF PRESENT ILLNESS:   44 yo mo(280 lbs) with a plethora of health issues that include but not limited to , OSA(cpap dependent ) DM (poorly controlled) HTN(poorly controlled) bipolar, asthma, GERD who was scheduled ACD 3/18 for bulging disc C5 per Dr. Kathyrn Sheriff. During induction her bp was greater than 200 and was refractory to interventions. Surgery was canceled, extubated and transported to PACU with Cardene drip and SBP 140's. She required NIMS briefly  But is now able to protect her airway. PCCM asked to assist with her care.  PAST MEDICAL HISTORY :   has a past medical history of Diabetes mellitus; Hypertension; Hyperlipidemia; Depression; PTSD (post-traumatic stress disorder); Pain in joint, ankle and foot (02/17/2013); Anxiety; Bipolar 1 disorder; Sleep apnea; Asthma; Pneumonia; GERD (gastroesophageal reflux disease); Headache; and Arthritis.  has past surgical history that includes Tubal ligation; Midtarsal arthrodesis (Right, 07/17/12); Remove implant deep (Right, 07/17/12); Lapidus fusion (Right, 01/10/12); Carpal tunnel release (Left, 2001); Gastroc recession extremity (Right, 3//20/14); Hernia repair; and Remove Implant Deep (Right, 07/22/2014). Prior to Admission medications   Medication Sig Start Date End Date Taking? Authorizing Provider  albuterol (PROVENTIL HFA;VENTOLIN HFA) 108 (90 BASE) MCG/ACT inhaler Inhale 2 puffs into the lungs every 6 (six) hours as needed for wheezing or shortness of breath.   Yes Historical Provider, MD  albuterol (PROVENTIL) (2.5 MG/3ML) 0.083% nebulizer solution Take 2.5 mg by nebulization every 6 (six)  hours as needed for wheezing or shortness of breath.   Yes Historical Provider, MD  amLODipine (NORVASC) 10 MG tablet Take 10 mg by mouth daily.    Yes Historical Provider, MD  atenolol (TENORMIN) 50 MG tablet Take 50 mg by mouth daily.    Yes Historical Provider, MD  beclomethasone (QVAR) 80 MCG/ACT inhaler Inhale 2 puffs into the lungs 2 (two) times daily. 07/05/14  Yes Kathee Delton, MD  budesonide-formoterol [facility]) 160-4.5 MCG/ACT inhaler Inhale 2 puffs into the lungs 2 (two) times daily.   Yes Historical Provider, MD  DULoxetine (CYMBALTA) 60 MG capsule Take 60 mg by mouth at bedtime.    Yes Historical Provider, MD  insulin NPH-regular Human (NOVOLIN 70/30) (70-30) 100 UNIT/ML injection Inject 30 Units into the skin 2 (two) times daily with a meal.   Yes Historical Provider, MD  lisinopril (PRINIVIL,ZESTRIL) 20 MG tablet Take 20 mg by mouth daily.    Yes Historical Provider, MD  omeprazole (PRILOSEC) 20 MG capsule Take 20 mg by mouth daily.    Yes Historical Provider, MD  tiZANidine (ZANAFLEX) 4 MG tablet Take 4 mg by mouth every 8 (eight) hours as needed for muscle spasms.   Yes Historical Provider, MD  cyclobenzaprine (FLEXERIL) 10 MG tablet Take 1 tablet (10 mg total) by mouth 2 (two) times daily as needed for muscle spasms. Patient not taking: Reported on 08/20/2014 05/13/14   Elmyra Ricks Pisciotta, PA-C  Fluticasone-Salmeterol (ADVAIR DISKUS) 100-50 MCG/DOSE AEPB Inhale 1 puff into the lungs 2 (two) times daily. Patient not taking: Reported on 08/20/2014 06/09/14   Kathee Delton, MD  olmesartan (BENICAR) 20 MG tablet Take 1 tablet (20 mg total) by mouth daily. Patient not taking:  Reported on 08/20/2014 06/09/14   Kathee Delton, MD  ondansetron (ZOFRAN ODT) 8 MG disintegrating tablet Take 1 tablet (8 mg total) by mouth every 8 (eight) hours as needed for nausea or vomiting. 01/28/14   Sherwood Gambler, MD  oxyCODONE-acetaminophen (PERCOCET) 10-325 MG per tablet Take 1 tablet by mouth every 6 (six)  hours as needed for pain. Patient not taking: Reported on 09/03/2014 08/03/14   Myeong O Sheard, DPM  predniSONE (DELTASONE) 50 MG tablet Take 1 tablet (50 mg total) by mouth daily. Patient not taking: Reported on 09/03/2014 08/24/14   Alfonzo Beers, MD  trimethoprim-polymyxin b (POLYTRIM) ophthalmic solution Place 1 drop into the left eye every 4 (four) hours. Patient not taking: Reported on 08/20/2014 02/19/14   Donne Hazel, MD   Allergies  Allergen Reactions  . Celery Oil Hives  . Pork-Derived Products Nausea And Vomiting and Other (See Comments)    headache  . Latex Itching    Powdered latex gloves.      FAMILY HISTORY:  indicated that her mother is deceased. She indicated that her father is deceased. She indicated that both of her sisters are alive. She indicated that her brother is alive.  SOCIAL HISTORY:  reports that she quit smoking about 36 years ago. Her smoking use included Cigarettes. She has a 12 pack-year smoking history. She has never used smokeless tobacco. She reports that she drinks alcohol. She reports that she does not use illicit drugs.  REVIEW OF SYSTEMS: NA  SUBJECTIVE:   VITAL SIGNS: Temp:  [97.8 F (36.6 C)-97.9 F (36.6 C)] 97.8 F (36.6 C) (03/18 0930) Pulse Rate:  [93-136] 122 (03/18 1115) Resp:  [20-45] 23 (03/18 1115) BP: (146-178)/(82-101) 146/82 mmHg (03/18 0930) SpO2:  [93 %-100 %] 98 % (03/18 1115) Arterial Line BP: (138-191)/(56-72) 151/61 mmHg (03/18 1115) Weight:  [280 lb (127.007 kg)] 280 lb (127.007 kg) (03/18 0628) HEMODYNAMICS:   VENTILATOR SETTINGS:   INTAKE / OUTPUT:  Intake/Output Summary (Last 24 hours) at 09/03/14 1119 Last data filed at 09/03/14 0925  Gross per 24 hour  Intake   1800 ml  Output      0 ml  Net   1800 ml    PHYSICAL EXAMINATION: General:  MOAAF , groggy but interactive Neuro:  Follows commands, maex4 HEENT:  Short neck, no jvd Cardiovascular:  HSR RRR Lungs:  Decreased air movement, bibasilar  crackles Abdomen:  Obese Musculoskeletal:  intact Skin:  Mild lower ext edema  LABS:  CBC No results for input(s): WBC, HGB, HCT, PLT in the last 168 hours. Coag's No results for input(s): APTT, INR in the last 168 hours. BMET  Recent Labs Lab 09/03/14 0930  NA 136  K 4.2  CL 104  CO2 25  BUN <5*  CREATININE 1.08  GLUCOSE 227*   Electrolytes  Recent Labs Lab 09/03/14 0930  CALCIUM 8.7   Sepsis Markers No results for input(s): LATICACIDVEN, PROCALCITON, O2SATVEN in the last 168 hours. ABG No results for input(s): PHART, PCO2ART, PO2ART in the last 168 hours. Liver Enzymes  Recent Labs Lab 09/03/14 0930  AST 90*  ALT 79*  ALKPHOS 75  BILITOT 0.7  ALBUMIN 3.6   Cardiac Enzymes  Recent Labs Lab 09/03/14 0930  TROPONINI <0.03   Glucose  Recent Labs Lab 09/03/14 0622  GLUCAP 154*    Imaging No results found.   ASSESSMENT / PLAN:  PULMONARY OETT 3/18>>3/18 A: OSA on cpap Asthma P:   O2 as needed BD's Steroids as  needed on steroids as OPT for HNP presumed.  CARDIOVASCULAR CVL Rt rad aline 3/18>> A:  Refractory hypertension(on Norvasc, tentorium,lisinopril as opt) Currently on cardene drip ?Pheo vs cardiac event. P:  Continue Cardene drip Resume home antihypertensives then wean Cardene when able to take PO 2d echo CxR eval for edema>>mild pul edema UDS for completeness Diuresis 24 hour urine collection for pheo.  RENAL A:   No acute issue P:   BMET in AM. Replace electrolytes as indicated.  GASTROINTESTINAL A:   GERD P:   PPI Diet when ok with surgery.  HEMATOLOGIC A:   No acute issue P:  CBC in AM. Transfuse as per ICU protocol.  INFECTIOUS A:   None P:   Monitor WBC count and fever curve.  ENDOCRINE A: DM ?Thyroid disease. P:   [facility] DM coordinator consult  TSH and free T4.  NEUROLOGIC A:   Rass 1 currently C5-6 bulging disc P:   RASS goal: 1 Hold sedation Careful with narcotics  FAMILY  -  Updates:   - Inter-disciplinary family meet or Palliative Care meeting due by:  day 7  TODAY'S SUMMARY:  44 yo mo(280 lbs) with a plethora of health issues that include but not limited to , OSA(cpap dependent ) DM (poorly controlled) HTN(poorly controlled) bipolar, asthma, GERD who was scheduled ACD 3/18 for bulging disc C5 per Dr. Kathyrn Sheriff. During induction her bp was greater than 200 and was refractory to interventions. Surgery was canceled, extubated and transported to PACU with Cardene drip and SBP 140's. She required NIMS briefly  But is now able to protect her airway. PCCM asked to assist with her care.  Richardson Landry Minor ACNP Maryanna Shape PCCM Pager [phone] till 3 pm If no answer page [phone]  44 year old morbidly obese female presenting to the ICU with severe HTN during induction for anterior neck fusion that was stopped during induction due to fluctuant BP.  Anticipate this is related to the patient not taking multiple of her medications prior to surgical interventions.  Will admit to the ICU, start a cardene drip and give PO anti-HTN, anticipate BP will improve by AM after taking her PO medications then will d/c in AM.  The patient is critically ill with multiple organ systems failure and requires high complexity decision making for assessment and support, frequent evaluation and titration of therapies, application of advanced monitoring technologies and extensive interpretation of multiple databases.   Critical Care Time devoted to patient care services described in this note is  35  Minutes. This time reflects time of care of this signee Dr Jennet Maduro. This critical care time does not reflect procedure time, or teaching time or supervisory time of PA/NP/Med student/Med Resident etc but could involve care discussion time.  Rush Farmer, M.D. [facility] Pulmonary/Critical Care Medicine. Pager: [phone]. After hours pager: [phone].  09/03/2014, 11:29 AM

## 2014-09-03 NOTE — Progress Notes (Signed)
Pt. In pacu at 0930, pt wth increased work of breathing, hr and bp up, Dr Tresa Moore at bedside, cpap started per RT, NTG drip chart , Cardizem Gtt started at 5 mg/hr, labs sent

## 2014-09-03 NOTE — Transfer of Care (Signed)
Immediate Anesthesia Transfer of Care Note  Patient: Diana Henderson  Procedure(s) Performed: Procedure(s) with comments: ANTERIOR CERVICAL DECOMPRESSION/DISCECTOMY FUSION 1 LEVEL (N/A) - C56 anterior cervical decompression with fusion interbody prosthesis plating and bonegraft  Patient Location: PACU  Anesthesia Type:General  Level of Consciousness: awake, alert , oriented and patient cooperative  Airway & Oxygen Therapy: Patient Spontanous Breathing and Patient connected to nasal cannula oxygen  Post-op Assessment: Report given to RN, Post -op Vital signs reviewed and stable and Patient moving all extremities X 4  Post vital signs: Reviewed  Last Vitals:  Filed Vitals:   09/03/14 1000  BP:   Pulse: 133  Temp:   Resp: 35    Complications: No apparent anesthesia complications

## 2014-09-03 NOTE — Progress Notes (Signed)
Pt taken off cpap and placed on 3L [place] at this time. Pt wears cpap at home so will need with sleep

## 2014-09-03 NOTE — Anesthesia Preprocedure Evaluation (Addendum)
Anesthesia Evaluation  Patient identified by MRN, date of birth, ID band Patient awake    Reviewed: Allergy & Precautions, NPO status , Patient's Chart, lab work & pertinent test results  Airway Mallampati: II  TM Distance: >3 FB Neck ROM: Full    Dental  (+) Teeth Intact, Dental Advisory Given   Pulmonary asthma , sleep apnea , former smoker (quit 1980),  ACUTE ASTHMA 08/24/2014 ER  VISIT DC home after steroids ans\d DUOMED inhaler breath sounds clear to auscultation        Cardiovascular hypertension, Pt. on medications and Pt. on home beta blockers Rhythm:Regular  ECHO 04/2014 EF 60%   Neuro/Psych  Headaches, Anxiety Depression Bipolar Disorder CX mylopathy    GI/Hepatic Neg liver ROS, GERD-  Medicated and Controlled,  Endo/Other  diabetes, Poorly Controlled, Type 2Morbid obesity  Renal/GU negative Renal ROS     Musculoskeletal  (+) Arthritis -, Osteoarthritis,    Abdominal (+) + obese,   Peds  Hematology 16/48   Anesthesia Other Findings   Reproductive/Obstetrics                          Anesthesia Physical Anesthesia Plan  ASA: III  Anesthesia Plan: General   Post-op Pain Management:    Induction: Intravenous  Airway Management Planned: Oral ETT and Video Laryngoscope Planned  Additional Equipment:   Intra-op Plan:   Post-operative Plan: Extubation in OR  Informed Consent: I have reviewed the patients History and Physical, chart, labs and discussed the procedure including the risks, benefits and alternatives for the proposed anesthesia with the patient or authorized representative who has indicated his/her understanding and acceptance.   Dental advisory given  Plan Discussed with: CRNA, Anesthesiologist and Surgeon  Anesthesia Plan Comments: (Multimodal pain RX, Glide for CX mylopathy)      Anesthesia Quick Evaluation

## 2014-09-03 NOTE — Progress Notes (Signed)
44 YRO female BMI 45 for ACDF developed extreme hypertension on induction of anesthesia.  RX hydralazine, fentanyl, NTG, high dose SEVO.  Art line placed.  HTN again with surgical positioning.  Both times BP systolic 956-213 diastolic 086-578 and tachycardia.  Ventilation pressures and evidence of Pulmonary edema fluid in ET tube with HTN.  ISTAT done with good values, but decision to abort elective case was made.  EKG, CXR, drug screan, metabolic profile, cardiac enzymes ordered.  Need to RO pheo, drug issues and herbals, and other possible causes of HTN.   Induction was with 200mg  of propofol, 156mcg of fentanyl, 100mg  lidocaine, 50mg  Rocuronium, and inhaled SEVO.  Patient now being taken to PACU on NTG drip and will need further work up.

## 2014-09-03 NOTE — Progress Notes (Signed)
I and O cathed for urine drug screen, approx 300 cc

## 2014-09-03 NOTE — Anesthesia Procedure Notes (Signed)
Procedure Name: Intubation Date/Time: 09/03/2014 7:45 AM Performed by: Carney Living Pre-anesthesia Checklist: Patient identified, Emergency Drugs available, Suction available, Patient being monitored and Timeout performed Patient Re-evaluated:Patient Re-evaluated prior to inductionOxygen Delivery Method: Circle system utilized Preoxygenation: Pre-oxygenation with 100% oxygen Intubation Type: IV induction Ventilation: Mask ventilation without difficulty and Oral airway inserted - appropriate to patient size Laryngoscope Size: Mac and 4 Grade View: Grade I Tube type: Oral Tube size: 7.5 mm Number of attempts: 1 Airway Equipment and Method: Stylet Placement Confirmation: ETT inserted through vocal cords under direct vision,  positive ETCO2 and breath sounds checked- equal and bilateral Secured at: 22 cm Tube secured with: Tape Dental Injury: Teeth and Oropharynx as per pre-operative assessment

## 2014-09-03 NOTE — H&P (Signed)
CC:  Left hand numbness, weakness  HPI: Diana Henderson is a 44 y.o. female initially seen in the office with left hand numbness. She also admits to some pain in the left hand, as well as weakness of the left hand. She says that she is noted difficulty with fine movements of the left hand, including handling money and trouble with buttons zippers on clothing. She does not have a significant component of neck pain. She does have some difficulty walking, although this is because of a chronic history of right foot problems for which she has previously had surgery.  PMH: Past Medical History  Diagnosis Date  . Diabetes mellitus   . Hypertension   . Hyperlipidemia   . Depression   . PTSD (post-traumatic stress disorder)   . Pain in joint, ankle and foot 02/17/2013  . Anxiety   . Bipolar 1 disorder   . Sleep apnea     uses CPAP intermittently, pt. doesn't remember where she had the study  . Asthma     has been eval. by Dr. Gwenette Greet- last 06/2014  . Pneumonia     hosp. for a couple of day- not sure when it was   . GERD (gastroesophageal reflux disease)   . Headache     sometimes has migraines   . Arthritis     osteoarthritis     PSH: Past Surgical History  Procedure Laterality Date  . Tubal ligation    . Midtarsal arthrodesis Right 07/17/12    Rt foot  . Remove implant deep Right 07/17/12    Rt foot  . Lapidus fusion Right 01/10/12    Rt foot  . Carpal tunnel release Left 2001    Left wrist  . Gastroc recession extremity Right 3//20/14    Right foot  . Hernia repair      1996  . Remove implant deep Right 07/22/2014    Rt foot @ [facility]    SH: History  Substance Use Topics  . Smoking status: Former Smoker -- 2.00 packs/day for 6 years    Types: Cigarettes    Quit date: 06/18/1978  . Smokeless tobacco: Never Used  . Alcohol Use: 0.0 oz/week    0 Standard drinks or equivalent per week     Comment: weekend- use of beer     MEDS: Prior to Admission medications   Medication  Sig Start Date End Date Taking? Authorizing Provider  albuterol (PROVENTIL HFA;VENTOLIN HFA) 108 (90 BASE) MCG/ACT inhaler Inhale 2 puffs into the lungs every 6 (six) hours as needed for wheezing or shortness of breath.   Yes Historical Provider, MD  albuterol (PROVENTIL) (2.5 MG/3ML) 0.083% nebulizer solution Take 2.5 mg by nebulization every 6 (six) hours as needed for wheezing or shortness of breath.   Yes Historical Provider, MD  amLODipine (NORVASC) 10 MG tablet Take 10 mg by mouth daily.    Yes Historical Provider, MD  atenolol (TENORMIN) 50 MG tablet Take 50 mg by mouth daily.    Yes Historical Provider, MD  beclomethasone (QVAR) 80 MCG/ACT inhaler Inhale 2 puffs into the lungs 2 (two) times daily. 07/05/14  Yes Kathee Delton, MD  budesonide-formoterol [facility]) 160-4.5 MCG/ACT inhaler Inhale 2 puffs into the lungs 2 (two) times daily.   Yes Historical Provider, MD  DULoxetine (CYMBALTA) 60 MG capsule Take 60 mg by mouth at bedtime.    Yes Historical Provider, MD  insulin NPH-regular Human (NOVOLIN 70/30) (70-30) 100 UNIT/ML injection Inject 30 Units into the skin 2 (  two) times daily with a meal.   Yes Historical Provider, MD  lisinopril (PRINIVIL,ZESTRIL) 20 MG tablet Take 20 mg by mouth daily.    Yes Historical Provider, MD  omeprazole (PRILOSEC) 20 MG capsule Take 20 mg by mouth daily.    Yes Historical Provider, MD  tiZANidine (ZANAFLEX) 4 MG tablet Take 4 mg by mouth every 8 (eight) hours as needed for muscle spasms.   Yes Historical Provider, MD  cyclobenzaprine (FLEXERIL) 10 MG tablet Take 1 tablet (10 mg total) by mouth 2 (two) times daily as needed for muscle spasms. Patient not taking: Reported on 08/20/2014 05/13/14   Elmyra Ricks Pisciotta, PA-C  Fluticasone-Salmeterol (ADVAIR DISKUS) 100-50 MCG/DOSE AEPB Inhale 1 puff into the lungs 2 (two) times daily. Patient not taking: Reported on 08/20/2014 06/09/14   Kathee Delton, MD  olmesartan (BENICAR) 20 MG tablet Take 1 tablet (20 mg total)  by mouth daily. Patient not taking: Reported on 08/20/2014 06/09/14   Kathee Delton, MD  ondansetron (ZOFRAN ODT) 8 MG disintegrating tablet Take 1 tablet (8 mg total) by mouth every 8 (eight) hours as needed for nausea or vomiting. 01/28/14   Sherwood Gambler, MD  oxyCODONE-acetaminophen (PERCOCET) 10-325 MG per tablet Take 1 tablet by mouth every 6 (six) hours as needed for pain. Patient not taking: Reported on 09/03/2014 08/03/14   Myeong O Sheard, DPM  predniSONE (DELTASONE) 50 MG tablet Take 1 tablet (50 mg total) by mouth daily. Patient not taking: Reported on 09/03/2014 08/24/14   Alfonzo Beers, MD  trimethoprim-polymyxin b (POLYTRIM) ophthalmic solution Place 1 drop into the left eye every 4 (four) hours. Patient not taking: Reported on 08/20/2014 02/19/14   Donne Hazel, MD    ALLERGY: Allergies  Allergen Reactions  . Celery Oil Hives  . Pork-Derived Products Nausea And Vomiting and Other (See Comments)    headache  . Latex Itching    Powdered latex gloves.      ROS: ROS  NEUROLOGIC EXAM: Awake, alert, oriented Memory and concentration grossly intact Speech fluent, appropriate CN grossly intact Motor exam: Upper Extremities Deltoid Bicep Tricep Grip  Right 5/5 5/5 5/5 5/5  Left 5/5 4/5 5/5 4/5   Lower Extremity IP Quad PF DF EHL  Right 5/5 5/5 5/5 5/5 5/5  Left 5/5 5/5 5/5 5/5 5/5   Sensation grossly intact to LT  IMGAING: MRI of the cervical spine demonstrates straightening of normal cervical lordosis. Primary pathologies at C5-6 where there is central disc bulge with severe central stenosis and spinal cord compression. Inversion recovery sequences demonstrate hyperintense signal within the spinal cord at this level.  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  Risks and benefits of surgical decompression by the above surgery were also reviewed in detail. The patient provided informed consent to proceed with the above surgery after all her questions were answered.

## 2014-09-04 ENCOUNTER — Inpatient Hospital Stay (HOSPITAL_COMMUNITY): Payer: Medicare Other

## 2014-09-04 DIAGNOSIS — R51 Headache: Secondary | ICD-10-CM

## 2014-09-04 DIAGNOSIS — G441 Vascular headache, not elsewhere classified: Secondary | ICD-10-CM

## 2014-09-04 DIAGNOSIS — F1010 Alcohol abuse, uncomplicated: Secondary | ICD-10-CM

## 2014-09-04 LAB — CBC
HCT: 37.8 % (ref 36.0–46.0)
HEMOGLOBIN: 12 g/dL (ref 12.0–15.0)
MCH: 28 pg (ref 26.0–34.0)
MCHC: 31.7 g/dL (ref 30.0–36.0)
MCV: 88.1 fL (ref 78.0–100.0)
Platelets: 179 10*3/uL (ref 150–400)
RBC: 4.29 MIL/uL (ref 3.87–5.11)
RDW: 14.4 % (ref 11.5–15.5)
WBC: 7 10*3/uL (ref 4.0–10.5)

## 2014-09-04 LAB — PHOSPHORUS: Phosphorus: 3.5 mg/dL (ref 2.3–4.6)

## 2014-09-04 LAB — BASIC METABOLIC PANEL
Anion gap: 5 (ref 5–15)
BUN: <5 mg/dL — ABNORMAL LOW (ref 6–23)
CALCIUM: 8.2 mg/dL — AB (ref 8.4–10.5)
CHLORIDE: 102 mmol/L (ref 96–112)
CO2: 28 mmol/L (ref 19–32)
Creatinine, Ser: 1.12 mg/dL — ABNORMAL HIGH (ref 0.50–1.10)
GFR calc Af Amer: 68 mL/min — ABNORMAL LOW (ref >90)
GFR, EST NON AFRICAN AMERICAN: 58 mL/min — AB (ref >90)
GLUCOSE: 177 mg/dL — AB (ref 70–99)
Potassium: 3.9 mmol/L (ref 3.5–5.1)
Sodium: 135 mmol/L (ref 135–145)

## 2014-09-04 LAB — GLUCOSE, CAPILLARY
GLUCOSE-CAPILLARY: 180 mg/dL — AB (ref 70–99)
GLUCOSE-CAPILLARY: 148 mg/dL — AB (ref 70–99)
Glucose-Capillary: 232 mg/dL — ABNORMAL HIGH (ref 70–99)
Glucose-Capillary: 157 mg/dL — ABNORMAL HIGH (ref 70–99)
Glucose-Capillary: 161 mg/dL — ABNORMAL HIGH (ref 70–99)

## 2014-09-04 LAB — MAGNESIUM: Magnesium: 1.7 mg/dL (ref 1.5–2.5)

## 2014-09-04 LAB — T4, FREE: Free T4: 1.25 ng/dL (ref 0.80–1.80)

## 2014-09-04 MED ORDER — INFLUENZA VAC SPLIT QUAD 0.5 ML IM SUSY
0.5000 mL | PREFILLED_SYRINGE | INTRAMUSCULAR | Status: AC
  Administered 2014-09-05: 0.5 mL via INTRAMUSCULAR
  Filled 2014-09-04: qty 0.5

## 2014-09-04 MED ORDER — MAGNESIUM SULFATE 50 % IJ SOLN
2.0000 g | Freq: Once | INTRAVENOUS | Status: DC
  Administered 2014-09-04: 2 g via INTRAVENOUS
  Filled 2014-09-04: qty 4

## 2014-09-04 MED ORDER — MAGNESIUM SULFATE 2 GM/50ML IV SOLN: 2.0000 g | Freq: Once | INTRAVENOUS | Status: AC

## 2014-09-04 NOTE — Progress Notes (Signed)
Appreciate consultants help.  Patient developed extreme hypertension on induction and during surgical positioning.  Both times she had what should have been adequate anesthetic levels, (Induction 200mg  propofol, 100mg  lidocaine, 154mcg fentanyl, 50mg  rocuronium, inhaled SEVO, adequate BIS values).  Ventilation pressures increased along with BP and edema fluid had to be suctioned from ET tube.  Both epsiodes were RX'd with esmolol, NTG, Hydralazine additional fentanyl and higher inspired SEVO concentrations.  Arterial line placement verified cuff pressures.  ISAT labs were remarkable for her high PaO2 in spite of high ventilation pressures and presence of ET tube secretions.  Case was aborted and patient was allowed to awaken and was transported to PACU on NTG drip.    History significant for HTN, obesity, Asthma, anxiety, and denial of previous anesthetic problems.  Her Work up so far shows negative markers for acute myocardial injury, normal thyroid, unremarkable drug screen, denial of herbal use and denial of illicit drug use.  Her 24 hour urine collection to check for possibility of a pheo is not complete.  At this point I have no good explanation for the unexplained hypertension and both myself and the CRNA have never seen a patient respond this way except for one undiagnosed pheo, and several not disclosed cocaine and herbal users.    Again, I want to thank all who have been managing this interesting patient.

## 2014-09-04 NOTE — Consult Note (Signed)
PULMONARY / CRITICAL CARE MEDICINE   Name: Diana Henderson MRN: [id number] DOB: 24-Jul-1970    ADMISSION DATE:  09/03/2014 CONSULTATION DATE: 3/18  REFERRING MD :  NS  CHIEF COMPLAINT:  HTN  INITIAL PRESENTATION: For cervical surgery  STUDIES:  3/18 2 d>> 3/19 ct head>>neg 3/19 eeg>>> 3/20 mri>>>  SIGNIFICANT EVENTS: 3/18 cervical disc surgery cancelled due to htn after induction   SUBJECTIVE: remains confused  VITAL SIGNS: Temp:  [98 F (36.7 C)-98.4 F (36.9 C)] 98.4 F (36.9 C) (03/19 0800) Pulse Rate:  [81-119] 85 (03/19 1100) Resp:  [16-30] 17 (03/19 1100) BP: (97-138)/(54-88) 114/68 mmHg (03/19 1100) SpO2:  [92 %-100 %] 94 % (03/19 1100) Arterial Line BP: (97-191)/(55-118) 152/93 mmHg (03/19 1100) HEMODYNAMICS:   VENTILATOR SETTINGS:   INTAKE / OUTPUT:  Intake/Output Summary (Last 24 hours) at 09/04/14 1203 Last data filed at 09/04/14 1100  Gross per 24 hour  Intake 2223.08 ml  Output   3405 ml  Net -1181.92 ml    PHYSICAL EXAMINATION: General:  MOAA, no distress Neuro:  Follows commands minimal moves all ext equal HEENT:  Short neck,tough to tell  jvd Cardiovascular:  HSR RRR Lungs:  Decreased air movement, bibasilar crackles Abdomen:  Obese Musculoskeletal:  intact Skin:  Mild lower ext edema  LABS:  CBC  Recent Labs Lab 09/03/14 0846 09/03/14 2045 09/04/14 0555  WBC  --  11.4* 7.0  HGB 13.6 13.2 12.0  HCT 40.0 40.1 37.8  PLT  --  206 179   Coag's No results for input(s): APTT, INR in the last 168 hours. BMET  Recent Labs Lab 09/03/14 0846 09/03/14 0930 09/03/14 2045 09/04/14 0555  NA 135 136  --  135  K 4.3 4.2  --  3.9  CL  --  104  --  102  CO2  --  25  --  28  BUN  --  <5*  --  <5*  CREATININE  --  1.08 1.04 1.12*  GLUCOSE  --  227*  --  177*   Electrolytes  Recent Labs Lab 09/03/14 0930 09/04/14 0555  CALCIUM 8.7 8.2*  MG  --  1.7  PHOS  --  3.5   Sepsis Markers No results for input(s): LATICACIDVEN,  PROCALCITON, O2SATVEN in the last 168 hours. ABG  Recent Labs Lab 09/03/14 0846  PHART 7.349*  PCO2ART 49.2*  PO2ART 332.0*   Liver Enzymes  Recent Labs Lab 09/03/14 0930  AST 90*  ALT 79*  ALKPHOS 75  BILITOT 0.7  ALBUMIN 3.6   Cardiac Enzymes  Recent Labs Lab 09/03/14 0930 09/03/14 1400  TROPONINI <0.03 <0.03   Glucose  Recent Labs Lab 09/03/14 0622 09/03/14 0936 09/03/14 1954 09/03/14 2319 09/04/14 0820  GLUCAP 154* 202* 144* 232* 180*    Imaging Dg Chest Port 1 View  09/03/2014   CLINICAL DATA:  Asthma exacerbation.  EXAM: PORTABLE CHEST - 1 VIEW  COMPARISON:  Two-view chest 08/24/2014  FINDINGS: The heart is mildly enlarged. Lung volumes have decreased. Mild pulmonary vascular congestion is present without frank edema. There no definite effusions. No focal airspace consolidation is present.  IMPRESSION: 1. Cardiomegaly with mild pulmonary vascular congestion. 2. No focal airspace disease.   Electronically Signed   By: San Morelle M.D.   On: 09/03/2014 09:53     ASSESSMENT / PLAN:  PULMONARY OETT 3/18>>3/18 A: OSA on cpap Asthma P:   O2 as needed BD's repeta pcxr for edema, not impressed today  CARDIOVASCULAR CVL Rt rad  aline 3/18>> A:  Refractory hypertension(on Norvasc, tentorium,lisinopril as opt) P:  Currently off   Cardene drip MAP goal 25% reduction from highest MAP on addmission Resume home antihypertensives then wean Cardene when able to take PO 2d ec pending Diuresis can reduce 24 hour urine collection for pheo, pending, doubt this is dx  RENAL A:  Relative hypomag P:   BMET in AM. Replace mag  GASTROINTESTINAL A:   GERD P:   Npo Will need slp unless neuro imroves  HEMATOLOGIC A:   No acute issue P:  CBC in AM. Transfuse as per ICU protocolp CT head neg bleed, add sub q heo Reluctant to start asa if surgery will proceed soon  INFECTIOUS A:   None P:   Monitor WBC count and fever  curve.  ENDOCRINE A: D P:   SSI ssi  TSH wnl  NEUROLOGIC A:   Encephalopathy, r/o PRES, HTN induced, r/o CVA C5-6 bulging disc P:   RASS goal:0 CT neg Needs MRI for PRES? BP control see cvs eeg  FAMILY  - Updates:   - Inter-disciplinary family meet or Palliative Care meeting due by:  day 7  Ccm time 30 min   Lavon Paganini. Titus Mould, MD, [place] Pgr: [facility]

## 2014-09-04 NOTE — Progress Notes (Signed)
Pt does not wish to wear CPAP tonight.  No distress noted at this time.   

## 2014-09-04 NOTE — Addendum Note (Signed)
Addendum  created 09/04/14 6301 by [name], MD   Modules edited: Clinical Notes   Clinical Notes:  File: [id number]; Pend: [id number]; Pend: [id number]; Raelyn Number: [id number]; Raelyn Number: [id number]; Raelyn Number: [id number]; Pend: [id number]

## 2014-09-05 ENCOUNTER — Inpatient Hospital Stay (HOSPITAL_COMMUNITY): Payer: Medicare Other

## 2014-09-05 DIAGNOSIS — R5383 Other fatigue: Secondary | ICD-10-CM

## 2014-09-05 DIAGNOSIS — E785 Hyperlipidemia, unspecified: Secondary | ICD-10-CM

## 2014-09-05 DIAGNOSIS — M4712 Other spondylosis with myelopathy, cervical region: Secondary | ICD-10-CM | POA: Diagnosis not present

## 2014-09-05 DIAGNOSIS — J45901 Unspecified asthma with (acute) exacerbation: Secondary | ICD-10-CM

## 2014-09-05 DIAGNOSIS — F339 Major depressive disorder, recurrent, unspecified: Secondary | ICD-10-CM

## 2014-09-05 DIAGNOSIS — E119 Type 2 diabetes mellitus without complications: Secondary | ICD-10-CM

## 2014-09-05 LAB — GLUCOSE, CAPILLARY
GLUCOSE-CAPILLARY: 153 mg/dL — AB (ref 70–99)
Glucose-Capillary: 162 mg/dL — ABNORMAL HIGH (ref 70–99)
Glucose-Capillary: 129 mg/dL — ABNORMAL HIGH (ref 70–99)
Glucose-Capillary: 134 mg/dL — ABNORMAL HIGH (ref 70–99)

## 2014-09-05 LAB — COMPREHENSIVE METABOLIC PANEL
ALBUMIN: 3.4 g/dL — AB (ref 3.5–5.2)
ALT: 46 U/L — ABNORMAL HIGH (ref 0–35)
ANION GAP: 10 (ref 5–15)
AST: 40 U/L — ABNORMAL HIGH (ref 0–37)
Alkaline Phosphatase: 64 U/L (ref 39–117)
BUN: 6 mg/dL (ref 6–23)
CALCIUM: 8.9 mg/dL (ref 8.4–10.5)
CO2: 24 mmol/L (ref 19–32)
CREATININE: 1.11 mg/dL — AB (ref 0.50–1.10)
Chloride: 101 mmol/L (ref 96–112)
GFR calc Af Amer: 68 mL/min — ABNORMAL LOW (ref >90)
GFR, EST NON AFRICAN AMERICAN: 59 mL/min — AB (ref >90)
GLUCOSE: 159 mg/dL — AB (ref 70–99)
POTASSIUM: 4.2 mmol/L (ref 3.5–5.1)
Sodium: 135 mmol/L (ref 135–145)
Total Bilirubin: 0.8 mg/dL (ref 0.3–1.2)
Total Protein: 6.4 g/dL (ref 6.0–8.3)

## 2014-09-05 LAB — CK: CK TOTAL: 95 U/L (ref 7–177)

## 2014-09-05 LAB — AMMONIA: AMMONIA: 69 umol/L — AB (ref 11–32)

## 2014-09-05 NOTE — Progress Notes (Signed)
Patient ID: Diana Henderson, female   DOB: Oct 13, 1970, 44 y.o.   MRN: [id number] BP 123/67 mmHg  Pulse 72  Temp(Src) 98.1 F (36.7 C) (Oral)  Resp 19  Ht 5\' 6"  (1.676 m)  Wt 134 kg (295 lb 6.7 oz)  BMI 47.70 kg/m2  SpO2 95%  LMP 08/29/2014 Lethargic, answers questions, follows most commands Confused also Moving all extremities No reason for encephalopathy identified, but she is definitely encephalopathic MRI, normal as was the CT. OK for transfer. OR on hold for now.

## 2014-09-05 NOTE — Progress Notes (Signed)
Patient is refusing to wear CPAP. RT advised patient to call if she changes her mind. RT will continue to monitor.

## 2014-09-05 NOTE — Consult Note (Signed)
PULMONARY / CRITICAL CARE MEDICINE   Name: Diana Henderson MRN: [id number] DOB: August 10, 1970    ADMISSION DATE:  09/03/2014 CONSULTATION DATE: 3/18  REFERRING MD :  NS  CHIEF COMPLAINT:  HTN  INITIAL PRESENTATION: For cervical surgery  STUDIES:  3/18 2 d>> 3/19 ct head>>neg 3/19 eeg>>> 3/20 mri>>>neg  SIGNIFICANT EVENTS: 3/18 cervical disc surgery cancelled due to htn after induction   SUBJECTIVE: more alert, less confused  VITAL SIGNS: Temp:  [97.6 F (36.4 C)-99 F (37.2 C)] 98.1 F (36.7 C) (03/20 0800) Pulse Rate:  [72-85] 72 (03/20 0900) Resp:  [15-23] 18 (03/20 0900) BP: (114-149)/(57-114) 135/79 mmHg (03/20 0900) SpO2:  [91 %-98 %] 95 % (03/20 0904) Arterial Line BP: (141-162)/(72-95) 162/95 mmHg (03/19 1900) HEMODYNAMICS:   VENTILATOR SETTINGS:   INTAKE / OUTPUT:  Intake/Output Summary (Last 24 hours) at 09/05/14 1050 Last data filed at 09/05/14 0800  Gross per 24 hour  Intake 1748.67 ml  Output   3225 ml  Net -1476.33 ml    PHYSICAL EXAMINATION: General:  MOAA, no distress Neuro:  Follows commands, moves all arms legs, more alert HEENT:  Short neck, less jvd Cardiovascular:  HSR RRR Lungs:  CTa reduced Abdomen:  Obese Musculoskeletal:  intact Skin:  Mild lower ext edema  LABS:  CBC  Recent Labs Lab 09/03/14 0846 09/03/14 2045 09/04/14 0555  WBC  --  11.4* 7.0  HGB 13.6 13.2 12.0  HCT 40.0 40.1 37.8  PLT  --  206 179   Coag's No results for input(s): APTT, INR in the last 168 hours. BMET  Recent Labs Lab 09/03/14 0930 09/03/14 2045 09/04/14 0555 09/05/14 0300  NA 136  --  135 135  K 4.2  --  3.9 4.2  CL 104  --  102 101  CO2 25  --  28 24  BUN <5*  --  <5* 6  CREATININE 1.08 1.04 1.12* 1.11*  GLUCOSE 227*  --  177* 159*   Electrolytes  Recent Labs Lab 09/03/14 0930 09/04/14 0555 09/05/14 0300  CALCIUM 8.7 8.2* 8.9  MG  --  1.7  --   PHOS  --  3.5  --    Sepsis Markers No results for input(s): LATICACIDVEN,  PROCALCITON, O2SATVEN in the last 168 hours. ABG  Recent Labs Lab 09/03/14 0846  PHART 7.349*  PCO2ART 49.2*  PO2ART 332.0*   Liver Enzymes  Recent Labs Lab 09/03/14 0930 09/05/14 0300  AST 90* 40*  ALT 79* 46*  ALKPHOS 75 64  BILITOT 0.7 0.8  ALBUMIN 3.6 3.4*   Cardiac Enzymes  Recent Labs Lab 09/03/14 0930 09/03/14 1400  TROPONINI <0.03 <0.03   Glucose  Recent Labs Lab 09/03/14 2319 09/04/14 0820 09/04/14 1152 09/04/14 1550 09/04/14 2141 09/05/14 0806  GLUCAP 232* 180* 148* 157* 161* 162*    Imaging Ct Head Wo Contrast  09/04/2014   CLINICAL DATA:  Hypertensive after being placed under general anesthesia. Headache with confusion.  EXAM: CT HEAD WITHOUT CONTRAST  TECHNIQUE: Contiguous axial images were obtained from the base of the skull through the vertex without intravenous contrast.  COMPARISON:  MR brain 06/17/2014.  CT head 12/03/2013.  FINDINGS: No evidence for acute infarction, hemorrhage, mass lesion, hydrocephalus, or extra-axial fluid. Normal cerebral volume. No visible white matter disease. No signs of P RDS. Calvarium intact. No acute sinus or mastoid disease.  IMPRESSION: Negative exam.  No change from normal priors.   Electronically Signed   By: Rolla Flatten M.D.   On:  09/04/2014 11:56   Mr Brain Wo Contrast  09/04/2014   CLINICAL DATA:  Severe hemodynamic reactivity related to anesthesia. Hypertensive after being placed under general anesthesia. Headache with confusion. Initial encounter.  EXAM: MRI HEAD WITHOUT CONTRAST  TECHNIQUE: Multiplanar, multiecho pulse sequences of the brain and surrounding structures were obtained without intravenous contrast.  COMPARISON:  CT head earlier today. Also prior MR 06/17/2014 of the brain.  FINDINGS: No evidence for acute infarction, hemorrhage, mass lesion, hydrocephalus, or extra-axial fluid. Normal cerebral volume. Minor subcortical white matter bilaterally, LEFT greater than RIGHT, nonspecific, but could  represent early manifestation of chronic microvascular ischemic change. No features to suggest demyelinating disease, or significant vasculitis. Pituitary, pineal, and cerebellar tonsils unremarkable. No upper cervical lesions. Flow voids are maintained throughout the carotid, basilar, and vertebral arteries. There are no areas of chronic hemorrhage. Visualized calvarium, skull base, and upper cervical osseous structures unremarkable. Scalp and extracranial soft tissues, orbits, sinuses, and mastoids show no acute process.  Compared with priors, there is no change.  IMPRESSION: No acute intracranial findings.  Minor white matter disease, nonspecific and likely incidental.  No intracranial vascular occlusion or signs of hypertensive encephalopathy.   Electronically Signed   By: Rolla Flatten M.D.   On: 09/04/2014 15:42     ASSESSMENT / PLAN:  PULMONARY OETT 3/18>>3/18 A: OSA on cpap Asthma P:   O2 as needed BD's Refused cpap, need to counsel her further  CARDIOVASCULAR CVL Rt rad aline 3/18>> A:  Refractory hypertension(on Norvasc, tentorium,lisinopril as opt) P:  tele MAP goal 25% reduction from highest MAP on admission - can have current sys goals now 130 norvasc attenolo, ACEI  2d ec pending still Diuresis on own allowed, did well with this 24 hour urine collection for pheo done, will result inam   RENAL A:  Relative hypomag, autodiuresis P:   BMET in AM Allow neg balance  GASTROINTESTINAL A:   GERD P:   Start diet  HEMATOLOGIC A:   No acute issue P:  CBC in AM.sub q hep Reluctant to start asa if surgery will proceed soon  ENDOCRINE A: D P:   SSI TSH wnl  NEUROLOGIC A:   Encephalopathy, No evidence PRES, HTN induced enceph C5-6 bulging disc P:   RASS goal:0 BP control see cvs eeg awaited Control BP  FAMILY  - Updates:   - Inter-disciplinary family meet or Palliative Care meeting due by:  day 7  To med floor, [facility] Lavon Paganini. Titus Mould, MD,  [place] Pgr: [facility]

## 2014-09-05 NOTE — Consult Note (Signed)
Consult Reason for Consult: altered mental status Referring Physician: Dr Titus Mould  CC: altered mental status  HPI: Diana Henderson is an 44 y.o. female hx of DM, HTN, HLD, bipolar 1 disorder, admitted on 3/18 for scheduled ACD secondary to bulging disc at C5. During induction her SBP went up to 240-260s, she was tachycardic and refractory to treatment. Surgery was cancelled. For induction she received sevoflurane, propofol, fentanyl, lidocaine, rocuronium.   Since transfer to ICU her blood pressure has remained stable. An MRI brain was completed, imaging reviewed, overall unremarkable. Her mental status has been slowly improving but she has not returned to baseline.   Past Medical History  Diagnosis Date  . Diabetes mellitus   . Hypertension   . Hyperlipidemia   . Depression   . PTSD (post-traumatic stress disorder)   . Pain in joint, ankle and foot 02/17/2013  . Anxiety   . Bipolar 1 disorder   . Sleep apnea     uses CPAP intermittently, pt. doesn't remember where she had the study  . Asthma     has been eval. by Dr. Gwenette Greet- last 06/2014  . Pneumonia     hosp. for a couple of day- not sure when it was   . GERD (gastroesophageal reflux disease)   . Headache     sometimes has migraines   . Arthritis     osteoarthritis     Past Surgical History  Procedure Laterality Date  . Tubal ligation    . Midtarsal arthrodesis Right 07/17/12    Rt foot  . Remove implant deep Right 07/17/12    Rt foot  . Lapidus fusion Right 01/10/12    Rt foot  . Carpal tunnel release Left 2001    Left wrist  . Gastroc recession extremity Right 3//20/14    Right foot  . Hernia repair      1996  . Remove implant deep Right 07/22/2014    Rt foot @ [facility]    Family History  Problem Relation Age of Onset  . Heart attack Father   . Kidney disease Mother     Social History:  reports that she quit smoking about 36 years ago. Her smoking use included Cigarettes. She has a 12 pack-year smoking history.  She has never used smokeless tobacco. She reports that she drinks alcohol. She reports that she does not use illicit drugs.  Allergies  Allergen Reactions  . Celery Oil Hives  . Pork-Derived Products Nausea And Vomiting and Other (See Comments)    headache  . Latex Itching    Powdered latex gloves.      Medications:  Scheduled: . amLODipine  10 mg Oral Daily  . atenolol  50 mg Oral Daily  . budesonide-formoterol  2 puff Inhalation BID  . docusate sodium  100 mg Oral BID  . DULoxetine  60 mg Oral QHS  . heparin  5,000 Units Subcutaneous 3 times per day  . insulin aspart  0-20 Units Subcutaneous TID WC  . lisinopril  20 mg Oral Daily  . pantoprazole  40 mg Oral Daily  . senna  1 tablet Oral BID    ROS: Out of a complete 14 system review, the patient complains of only the following symptoms, and all other reviewed systems are negative. +fatigue  Physical Examination: Filed Vitals:   09/05/14 0900  BP: 135/79  Pulse: 72  Temp:   Resp: 18   Physical Exam  Constitutional: obese, NAD Psych: lethargic Eyes: No scleral injection HENT: No  OP obstrucion Head: Normocephalic.  Cardiovascular: Normal rate and regular rhythm.  Respiratory: Effort normal and breath sounds normal.  GI: Soft. Bowel sounds are normal. No distension. There is no tenderness.  Skin: WDI  Neurologic Examination Mental Status: Lethargic, requires frequent stimulation to stay awake, will make eye contact. Oriented to name, "hospital". States date is "October 06, 2014". Follows simple commands but difficulty with multi-step commands Cranial Nerves: II: optic discs not visualized, visual fields grossly normal, pupils equal, round, reactive to light  III,IV, VI: ptosis not present, eyes midline, no gaze deviation, no nystagmus noted V,VII: smile symmetric, facial light touch sensation normal bilaterally VIII: hearing normal bilaterally IX,X: gag reflex present XI: trapezius strength/neck flexion  strength normal bilaterally XII: tongue strength normal  Motor: Not following commands so difficult to formally assess. Appears to move all extremities symmetrically with no focal deficit. Bilateral UE asterixis noted.  No increased tone or rigidity noted Sensory: intact to LT in all extremities Deep Tendon Reflexes: 2+ and symmetric throughout Plantars: Right: downgoing   Left: downgoing Cerebellar: Unable to test Gait: unable to test  Laboratory Studies:   Basic Metabolic Panel:  Recent Labs Lab 09/03/14 0846 09/03/14 0930 09/03/14 2045 09/04/14 0555 09/05/14 0300  NA 135 136  --  135 135  K 4.3 4.2  --  3.9 4.2  CL  --  104  --  102 101  CO2  --  25  --  28 24  GLUCOSE  --  227*  --  177* 159*  BUN  --  <5*  --  <5* 6  CREATININE  --  1.08 1.04 1.12* 1.11*  CALCIUM  --  8.7  --  8.2* 8.9  MG  --   --   --  1.7  --   PHOS  --   --   --  3.5  --     Liver Function Tests:  Recent Labs Lab 09/03/14 0930 09/05/14 0300  AST 90* 40*  ALT 79* 46*  ALKPHOS 75 64  BILITOT 0.7 0.8  PROT 6.6 6.4  ALBUMIN 3.6 3.4*   No results for input(s): LIPASE, AMYLASE in the last 168 hours. No results for input(s): AMMONIA in the last 168 hours.  CBC:  Recent Labs Lab 09/03/14 0846 09/03/14 2045 09/04/14 0555  WBC  --  11.4* 7.0  HGB 13.6 13.2 12.0  HCT 40.0 40.1 37.8  MCV  --  85.3 88.1  PLT  --  206 179    Cardiac Enzymes:  Recent Labs Lab 09/03/14 0930 09/03/14 1400  CKTOTAL 125  --   CKMB 1.8  --   TROPONINI <0.03 <0.03    BNP: Invalid input(s): POCBNP  CBG:  Recent Labs Lab 09/04/14 0820 09/04/14 1152 09/04/14 1550 09/04/14 2141 09/05/14 0806  GLUCAP 180* 148* 157* 161* 162*    Microbiology: Results for orders placed or performed during the hospital encounter of 08/20/14  Surgical pcr screen     Status: None   Collection Time: 08/20/14 10:19 AM  Result Value Ref Range Status   MRSA, PCR NEGATIVE NEGATIVE Final   Staphylococcus aureus  NEGATIVE NEGATIVE Final    Comment:        The Xpert SA Assay (FDA approved for NASAL specimens in patients over 46 years of age), is one component of a comprehensive surveillance program.  Test performance has been validated by [facility] for patients greater than or equal to 56 year old. It is not intended to diagnose  infection nor to guide or monitor treatment.     Coagulation Studies: No results for input(s): LABPROT, INR in the last 72 hours.  Urinalysis: No results for input(s): COLORURINE, LABSPEC, PHURINE, GLUCOSEU, HGBUR, BILIRUBINUR, KETONESUR, PROTEINUR, UROBILINOGEN, NITRITE, LEUKOCYTESUR in the last 168 hours.  Invalid input(s): APPERANCEUR  Lipid Panel:     Component Value Date/Time   CHOL 165 02/24/2012 0710   TRIG 74 02/24/2012 0710   HDL 61 02/24/2012 0710   CHOLHDL 2.7 02/24/2012 0710   VLDL 15 02/24/2012 0710   LDLCALC 89 02/24/2012 0710    HgbA1C:  Lab Results  Component Value Date   HGBA1C 7.9* 05/16/2014    Urine Drug Screen:     Component Value Date/Time   LABOPIA NONE DETECTED 09/03/2014 1200   COCAINSCRNUR NONE DETECTED 09/03/2014 1200   LABBENZ POSITIVE* 09/03/2014 1200   AMPHETMU NONE DETECTED 09/03/2014 1200   THCU NONE DETECTED 09/03/2014 1200   LABBARB NONE DETECTED 09/03/2014 1200    Alcohol Level: No results for input(s): ETH in the last 168 hours.  Other results:  Imaging: Ct Head Wo Contrast  09/04/2014   CLINICAL DATA:  Hypertensive after being placed under general anesthesia. Headache with confusion.  EXAM: CT HEAD WITHOUT CONTRAST  TECHNIQUE: Contiguous axial images were obtained from the base of the skull through the vertex without intravenous contrast.  COMPARISON:  MR brain 06/17/2014.  CT head 12/03/2013.  FINDINGS: No evidence for acute infarction, hemorrhage, mass lesion, hydrocephalus, or extra-axial fluid. Normal cerebral volume. No visible white matter disease. No signs of P RDS. Calvarium intact. No acute sinus or  mastoid disease.  IMPRESSION: Negative exam.  No change from normal priors.   Electronically Signed   By: Rolla Flatten M.D.   On: 09/04/2014 11:56   Mr Brain Wo Contrast  09/04/2014   CLINICAL DATA:  Severe hemodynamic reactivity related to anesthesia. Hypertensive after being placed under general anesthesia. Headache with confusion. Initial encounter.  EXAM: MRI HEAD WITHOUT CONTRAST  TECHNIQUE: Multiplanar, multiecho pulse sequences of the brain and surrounding structures were obtained without intravenous contrast.  COMPARISON:  CT head earlier today. Also prior MR 06/17/2014 of the brain.  FINDINGS: No evidence for acute infarction, hemorrhage, mass lesion, hydrocephalus, or extra-axial fluid. Normal cerebral volume. Minor subcortical white matter bilaterally, LEFT greater than RIGHT, nonspecific, but could represent early manifestation of chronic microvascular ischemic change. No features to suggest demyelinating disease, or significant vasculitis. Pituitary, pineal, and cerebellar tonsils unremarkable. No upper cervical lesions. Flow voids are maintained throughout the carotid, basilar, and vertebral arteries. There are no areas of chronic hemorrhage. Visualized calvarium, skull base, and upper cervical osseous structures unremarkable. Scalp and extracranial soft tissues, orbits, sinuses, and mastoids show no acute process.  Compared with priors, there is no change.  IMPRESSION: No acute intracranial findings.  Minor white matter disease, nonspecific and likely incidental.  No intracranial vascular occlusion or signs of hypertensive encephalopathy.   Electronically Signed   By: Rolla Flatten M.D.   On: 09/04/2014 15:42   Dg Chest Port 1 View  09/05/2014   CLINICAL DATA:  Pulmonary edema  EXAM: PORTABLE CHEST - 1 VIEW  COMPARISON:  09/03/2014  FINDINGS: There is cardiomegaly, unchanged. There is no airspace consolidation or large effusion.  IMPRESSION: Cardiomegaly.   Electronically Signed   By: Andreas Newport M.D.   On: 09/05/2014 06:55     Assessment/Plan: 43y/o woman presented for planned cervical disc surgery which was cancelled due to marked hypertension after  anesthetic induction. Since then patients blood pressure has returned to baseline but she remains encephalopathic. Unclear etiology of symptoms.   -check EEG (though low suspicion for seizure) -check venous blood gas, CK, Ammonia -will continue to follow  Jim Like, DO [facility] [phone]  If 7pm- 7am, please page neurology on call as listed in [facility]. 09/05/2014, 11:23 AM

## 2014-09-06 ENCOUNTER — Inpatient Hospital Stay (HOSPITAL_COMMUNITY): Payer: Medicare Other

## 2014-09-06 DIAGNOSIS — G4733 Obstructive sleep apnea (adult) (pediatric): Secondary | ICD-10-CM

## 2014-09-06 DIAGNOSIS — R0600 Dyspnea, unspecified: Secondary | ICD-10-CM

## 2014-09-06 DIAGNOSIS — G9340 Encephalopathy, unspecified: Secondary | ICD-10-CM

## 2014-09-06 LAB — BASIC METABOLIC PANEL
ANION GAP: 7 (ref 5–15)
BUN: 7 mg/dL (ref 6–23)
CHLORIDE: 102 mmol/L (ref 96–112)
CO2: 27 mmol/L (ref 19–32)
CREATININE: 1.09 mg/dL (ref 0.50–1.10)
Calcium: 8.8 mg/dL (ref 8.4–10.5)
GFR calc Af Amer: 70 mL/min — ABNORMAL LOW (ref >90)
GFR calc non Af Amer: 60 mL/min — ABNORMAL LOW (ref >90)
Glucose, Bld: 123 mg/dL — ABNORMAL HIGH (ref 70–99)
Potassium: 4.2 mmol/L (ref 3.5–5.1)
SODIUM: 136 mmol/L (ref 135–145)

## 2014-09-06 LAB — GLUCOSE, CAPILLARY
GLUCOSE-CAPILLARY: 127 mg/dL — AB (ref 70–99)
GLUCOSE-CAPILLARY: 127 mg/dL — AB (ref 70–99)
Glucose-Capillary: 118 mg/dL — ABNORMAL HIGH (ref 70–99)
Glucose-Capillary: 135 mg/dL — ABNORMAL HIGH (ref 70–99)

## 2014-09-06 LAB — AMMONIA: Ammonia: 48 umol/L — ABNORMAL HIGH (ref 11–32)

## 2014-09-06 MED ORDER — LACTULOSE 10 GM/15ML PO SOLN
10.0000 g | Freq: Every day | ORAL | Status: DC
  Administered 2014-09-06 – 2014-09-07 (×2): 10 g via ORAL
  Filled 2014-09-06 (×2): qty 15

## 2014-09-06 MED ORDER — SODIUM CHLORIDE 0.9 % IV SOLN: INTRAVENOUS | Status: DC | PRN

## 2014-09-06 NOTE — Progress Notes (Signed)
PULMONARY / CRITICAL CARE MEDICINE   Name: Diana Henderson MRN: [id number] DOB: 02/25/1971    ADMISSION DATE:  09/03/2014  REFERRING MD :  NS  CHIEF COMPLAINT:  HTN  INITIAL PRESENTATION:  44 yo with lt hand numbness from cervical spondylosis with C5-6 cord compression developed malignant HTN during induction for anesthesia.  STUDIES:  3/19 ct head>>neg 3/19 eeg>>> 3/20 mri>>>neg  SIGNIFICANT EVENTS: 3/18 cervical disc surgery cancelled due to htn after induction   SUBJECTIVE:  More alert  VITAL SIGNS: Temp:  [97.5 F (36.4 C)-98.9 F (37.2 C)] 97.9 F (36.6 C) (03/21 0900) Pulse Rate:  [57-70] 68 (03/21 0900) Resp:  [14-19] 18 (03/21 0900) BP: (102-142)/(50-83) 132/78 mmHg (03/21 1122) SpO2:  [93 %-98 %] 96 % (03/21 0900) INTAKE / OUTPUT:  Intake/Output Summary (Last 24 hours) at 09/06/14 1135 Last data filed at 09/05/14 1500  Gross per 24 hour  Intake     40 ml  Output    250 ml  Net   -210 ml    PHYSICAL EXAMINATION: General:  MOAA, no distress Neuro:  Follows commands, moves all arms legs, more alert HEENT:  Short neck, less jvd Cardiovascular:  HSR RRR Lungs:  CTa reduced Abdomen:  Obese Musculoskeletal:  intact Skin:  Mild lower ext edema  LABS:  CBC  Recent Labs Lab 09/03/14 0846 09/03/14 2045 09/04/14 0555  WBC  --  11.4* 7.0  HGB 13.6 13.2 12.0  HCT 40.0 40.1 37.8  PLT  --  206 179   BMET  Recent Labs Lab 09/04/14 0555 09/05/14 0300 09/06/14 0730  NA 135 135 136  K 3.9 4.2 4.2  CL 102 101 102  CO2 28 24 27   BUN <5* 6 7  CREATININE 1.12* 1.11* 1.09  GLUCOSE 177* 159* 123*   Electrolytes  Recent Labs Lab 09/04/14 0555 09/05/14 0300 09/06/14 0730  CALCIUM 8.2* 8.9 8.8  MG 1.7  --   --   PHOS 3.5  --   --    ABG  Recent Labs Lab 09/03/14 0846  PHART 7.349*  PCO2ART 49.2*  PO2ART 332.0*   Liver Enzymes  Recent Labs Lab 09/03/14 0930 09/05/14 0300  AST 90* 40*  ALT 79* 46*  ALKPHOS 75 64  BILITOT 0.7  0.8  ALBUMIN 3.6 3.4*   Cardiac Enzymes  Recent Labs Lab 09/03/14 0930 09/03/14 1400  TROPONINI <0.03 <0.03   Glucose  Recent Labs Lab 09/04/14 2141 09/05/14 0806 09/05/14 1153 09/05/14 1636 09/05/14 2152 09/06/14 0645  GLUCAP 161* 162* 153* 129* 134* 127*    Imaging Dg Chest Port 1 View  09/05/2014   CLINICAL DATA:  Pulmonary edema  EXAM: PORTABLE CHEST - 1 VIEW  COMPARISON:  09/03/2014  FINDINGS: There is cardiomegaly, unchanged. There is no airspace consolidation or large effusion.  IMPRESSION: Cardiomegaly.   Electronically Signed   By: Andreas Newport M.D.   On: 09/05/2014 06:55     ASSESSMENT / PLAN:  Acute encephalopathy >> likely metabolic with elevated ammonia. Plan: - f/u ammonia, EEG - neuro following - add lactulose, f/u LFT's, check abd u/s  Lt arm weakness 2nd to cervical spine compression. 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 Plan: - continue norvasc, tenormin, prinivil  Hx of OSA, asthma. Plan: - Need to ensure CPAP qhs - continue symbicort  Hx of GERD. Plan: - protonix  Will ask [facility] to assume care 3/22 and PCCM off.  Chesley Mires, MD [facility] Pulmonary/Critical Care 09/06/2014, 11:42 AM Pager:  [phone] After 3pm call: [phone]

## 2014-09-06 NOTE — Progress Notes (Signed)
CARE MANAGEMENT NOTE 09/06/2014  Patient:  Diana Henderson, Diana Henderson   Account Number:  [id number]  Date Initiated:  09/06/2014  Documentation initiated by:  Olga Coaster  Subjective/Objective Assessment:   ADMITTED FOR SURGERY BUT BP ELEVATED/ UNCONTROLLED     Action/Plan:   CM FOLLOWING FOR DCP   Anticipated DC Date:  09/13/2014   Anticipated DC Plan:  CM TO FOLLOW PT FOR DISCHARGE PLANNING/ FOR SURGERY WHEN BP IS STABLE     DC Planning Services  CM consult       Status of service:  In process, will continue to follow  Per UR Regulation:  Reviewed for med. necessity/level of care/duration of stay  Comments:  3/21/2016Mindi Slicker RN,BSN,MHA [phone]

## 2014-09-06 NOTE — Progress Notes (Signed)
EEG Completed; Results Pending  

## 2014-09-06 NOTE — Progress Notes (Signed)
Echocardiogram 2D Echocardiogram has been performed.  Joelene Millin 09/06/2014, 11:46 AM

## 2014-09-06 NOTE — Progress Notes (Signed)
Subjective: patient awake, alert to hospital, believes it is April but knows the year is 2016.  She states people have told her she was here fro surery but cannot recall this.   Objective: Current vital signs: BP 125/67 mmHg  Pulse 68  Temp(Src) 97.9 F (36.6 C) (Oral)  Resp 18  Ht 5\' 6"  (1.676 m)  Wt 134 kg (295 lb 6.7 oz)  BMI 47.70 kg/m2  SpO2 96%  LMP 08/29/2014 Vital signs in last 24 hours: Temp:  [97.5 F (36.4 C)-98.9 F (37.2 C)] 97.9 F (36.6 C) (03/21 0900) Pulse Rate:  [57-70] 68 (03/21 0900) Resp:  [14-19] 18 (03/21 0900) BP: (102-142)/(50-83) 125/67 mmHg (03/21 0648) SpO2:  [93 %-98 %] 96 % (03/21 0900)  Intake/Output from previous day: 03/20 0701 - 03/21 0700 In: 300 [P.O.:220; I.V.:80] Out: 250 [Urine:250] Intake/Output this shift:   Nutritional status: Diet Carb Modified  Neurologic Exam: General: NAD Mental Status: Alert, oriented to hospital but not month.  Speech fluent without evidence of aphasia.  Able to follow 3 step commands without difficulty. Cranial Nerves: II: ; Visual fields grossly normal, pupils equal, round, reactive to light and accommodation III,IV, VI: ptosis not present, extra-ocular motions intact bilaterally V,VII: smile symmetric, facial light touch sensation normal bilaterally VIII: hearing normal bilaterally IX,X: uvula rises symmetrically XI: bilateral shoulder shrug XII: midline tongue extension without atrophy or fasciculations  Motor: Right : Upper extremity   5/5    Left:     Upper extremity   5/5  Lower extremity   5/5     Lower extremity   5/5 Tone and bulk:normal tone throughout; no atrophy noted Sensory: Pinprick and light touch intact throughout, bilaterally Deep Tendon Reflexes:  1+ throughout UE and KJ  Plantars: Right: downgoing   Left: downgoing Cerebellar: normal finger-to-nose,  normal heel-to-shin test    Lab Results: Basic Metabolic Panel:  Recent Labs Lab 09/03/14 0846  09/03/14 0930  09/03/14 2045 09/04/14 0555 09/05/14 0300 09/06/14 0730  NA 135  --  136  --  135 135 136  K 4.3  --  4.2  --  3.9 4.2 4.2  CL  --   --  104  --  102 101 102  CO2  --   --  25  --  28 24 27   GLUCOSE  --   --  227*  --  177* 159* 123*  BUN  --   --  <5*  --  <5* 6 7  CREATININE  --   --  1.08 1.04 1.12* 1.11* 1.09  CALCIUM  --   < > 8.7  --  8.2* 8.9 8.8  MG  --   --   --   --  1.7  --   --   PHOS  --   --   --   --  3.5  --   --   < > = values in this interval not displayed.  Liver Function Tests:  Recent Labs Lab 09/03/14 0930 09/05/14 0300  AST 90* 40*  ALT 79* 46*  ALKPHOS 75 64  BILITOT 0.7 0.8  PROT 6.6 6.4  ALBUMIN 3.6 3.4*   No results for input(s): LIPASE, AMYLASE in the last 168 hours.  Recent Labs Lab 09/05/14 1318  AMMONIA 69*    CBC:  Recent Labs Lab 09/03/14 0846 09/03/14 2045 09/04/14 0555  WBC  --  11.4* 7.0  HGB 13.6 13.2 12.0  HCT 40.0 40.1 37.8  MCV  --  85.3 88.1  PLT  --  206 179    Cardiac Enzymes:  Recent Labs Lab 09/03/14 0930 09/03/14 1400 09/05/14 1318  CKTOTAL 125  --  95  CKMB 1.8  --   --   TROPONINI <0.03 <0.03  --     Lipid Panel: No results for input(s): CHOL, TRIG, HDL, CHOLHDL, VLDL, LDLCALC in the last 168 hours.  CBG:  Recent Labs Lab 09/05/14 0806 09/05/14 1153 09/05/14 1636 09/05/14 2152 09/06/14 0645  GLUCAP 162* 153* 129* 134* 127*    Microbiology: Results for orders placed or performed during the hospital encounter of 08/20/14  Surgical pcr screen     Status: None   Collection Time: 08/20/14 10:19 AM  Result Value Ref Range Status   MRSA, PCR NEGATIVE NEGATIVE Final   Staphylococcus aureus NEGATIVE NEGATIVE Final    Comment:        The Xpert SA Assay (FDA approved for NASAL specimens in patients over 73 years of age), is one component of a comprehensive surveillance program.  Test performance has been validated by [facility] for patients greater than or equal to 29 year old. It is  not intended to diagnose infection nor to guide or monitor treatment.     Coagulation Studies: No results for input(s): LABPROT, INR in the last 72 hours.  Imaging: Ct Head Wo Contrast  09/04/2014   CLINICAL DATA:  Hypertensive after being placed under general anesthesia. Headache with confusion.  EXAM: CT HEAD WITHOUT CONTRAST  TECHNIQUE: Contiguous axial images were obtained from the base of the skull through the vertex without intravenous contrast.  COMPARISON:  MR brain 06/17/2014.  CT head 12/03/2013.  FINDINGS: No evidence for acute infarction, hemorrhage, mass lesion, hydrocephalus, or extra-axial fluid. Normal cerebral volume. No visible white matter disease. No signs of P RDS. Calvarium intact. No acute sinus or mastoid disease.  IMPRESSION: Negative exam.  No change from normal priors.   Electronically Signed   By: Rolla Flatten M.D.   On: 09/04/2014 11:56   Mr Brain Wo Contrast  09/04/2014   CLINICAL DATA:  Severe hemodynamic reactivity related to anesthesia. Hypertensive after being placed under general anesthesia. Headache with confusion. Initial encounter.  EXAM: MRI HEAD WITHOUT CONTRAST  TECHNIQUE: Multiplanar, multiecho pulse sequences of the brain and surrounding structures were obtained without intravenous contrast.  COMPARISON:  CT head earlier today. Also prior MR 06/17/2014 of the brain.  FINDINGS: No evidence for acute infarction, hemorrhage, mass lesion, hydrocephalus, or extra-axial fluid. Normal cerebral volume. Minor subcortical white matter bilaterally, LEFT greater than RIGHT, nonspecific, but could represent early manifestation of chronic microvascular ischemic change. No features to suggest demyelinating disease, or significant vasculitis. Pituitary, pineal, and cerebellar tonsils unremarkable. No upper cervical lesions. Flow voids are maintained throughout the carotid, basilar, and vertebral arteries. There are no areas of chronic hemorrhage. Visualized calvarium, skull  base, and upper cervical osseous structures unremarkable. Scalp and extracranial soft tissues, orbits, sinuses, and mastoids show no acute process.  Compared with priors, there is no change.  IMPRESSION: No acute intracranial findings.  Minor white matter disease, nonspecific and likely incidental.  No intracranial vascular occlusion or signs of hypertensive encephalopathy.   Electronically Signed   By: Rolla Flatten M.D.   On: 09/04/2014 15:42   Dg Chest Port 1 View  09/05/2014   CLINICAL DATA:  Pulmonary edema  EXAM: PORTABLE CHEST - 1 VIEW  COMPARISON:  09/03/2014  FINDINGS: There is cardiomegaly, unchanged. There is no airspace consolidation  or large effusion.  IMPRESSION: Cardiomegaly.   Electronically Signed   By: Andreas Newport M.D.   On: 09/05/2014 06:55    Medications:  Scheduled: . amLODipine  10 mg Oral Daily  . atenolol  50 mg Oral Daily  . budesonide-formoterol  2 puff Inhalation BID  . docusate sodium  100 mg Oral BID  . DULoxetine  60 mg Oral QHS  . heparin  5,000 Units Subcutaneous 3 times per day  . insulin aspart  0-20 Units Subcutaneous TID WC  . lisinopril  20 mg Oral Daily  . pantoprazole  40 mg Oral Daily  . senna  1 tablet Oral BID    Assessment/Plan:  43y/o woman presented for planned cervical disc surgery which was cancelled due to marked hypertension after anesthetic induction. Since then patients blood pressure has returned to baseline.  Patient remains to have a hard time with the month and states she does not recall that she was going to have surgery.  Her neurology exam is non-focal. CK is normal, Ammonia slightly elevated at 69. CK WNL.  MRI shows no acute changes to account for encephalopathy.  --EEG pending --recheck ammonia   Etta Quill PA-C [facility] [phone]  I personally participate in this patient's evaluation and management, including formulating the above clinical impression and management recommendations.  Rush Farmer  M.D. [facility] [phone]   09/06/2014, 11:09 AM

## 2014-09-06 NOTE — Procedures (Signed)
ELECTROENCEPHALOGRAM REPORT  Date of Study: 09/06/2014  Patient's Name: Diana Henderson MRN: [id number] Date of Birth: 01-Dec-1970  Referring Provider: Merrie Roof, MD  Indication: Encephalopathy following episode of elevated blood pressure (systolic 793J-030S)  Medications: Cymbalta  Technical Summary: This is a multichannel digital EEG recording, using the international 10-20 placement system.  Spike detection software was employed.  Description: The EEG background is symmetric, with a well-developed posterior dominant rhythm of 10 Hz, which is reactive to eye opening and closing.  Diffuse beta activity is seen, with a bilateral frontal preponderance.  No focal or generalized abnormalities are seen.  No focal or generalized epileptiform discharges are seen.  Stage II sleep is not seen.  Hyperventilation and photic stimulation were not performed.  ECG revealed normal cardiac rate and rhythm.  Impression: This is a normal routine EEG of the awake and drowsy states, with activating procedures.  A normal study does not rule out the possibility of a seizure disorder in this patient.  Jeorgia Helming R. Tomi Likens, DO

## 2014-09-07 ENCOUNTER — Encounter (HOSPITAL_COMMUNITY): Payer: Self-pay | Admitting: Neurosurgery

## 2014-09-07 DIAGNOSIS — R51 Headache: Secondary | ICD-10-CM

## 2014-09-07 LAB — CBC
HCT: 42 % (ref 36.0–46.0)
Hemoglobin: 13.4 g/dL (ref 12.0–15.0)
MCH: 28.2 pg (ref 26.0–34.0)
MCHC: 31.9 g/dL (ref 30.0–36.0)
MCV: 88.4 fL (ref 78.0–100.0)
PLATELETS: 189 10*3/uL (ref 150–400)
RBC: 4.75 MIL/uL (ref 3.87–5.11)
RDW: 13.6 % (ref 11.5–15.5)
WBC: 5.1 10*3/uL (ref 4.0–10.5)

## 2014-09-07 LAB — COMPREHENSIVE METABOLIC PANEL
ALK PHOS: 59 U/L (ref 39–117)
ALT: 47 U/L — ABNORMAL HIGH (ref 0–35)
AST: 49 U/L — ABNORMAL HIGH (ref 0–37)
Albumin: 3.2 g/dL — ABNORMAL LOW (ref 3.5–5.2)
Anion gap: 9 (ref 5–15)
BUN: 6 mg/dL (ref 6–23)
CHLORIDE: 100 mmol/L (ref 96–112)
CO2: 27 mmol/L (ref 19–32)
CREATININE: 1.07 mg/dL (ref 0.50–1.10)
Calcium: 8.9 mg/dL (ref 8.4–10.5)
GFR, EST AFRICAN AMERICAN: 72 mL/min — AB (ref >90)
GFR, EST NON AFRICAN AMERICAN: 62 mL/min — AB (ref >90)
Glucose, Bld: 145 mg/dL — ABNORMAL HIGH (ref 70–99)
Potassium: 4.3 mmol/L (ref 3.5–5.1)
SODIUM: 136 mmol/L (ref 135–145)
Total Bilirubin: 0.6 mg/dL (ref 0.3–1.2)
Total Protein: 6.3 g/dL (ref 6.0–8.3)

## 2014-09-07 LAB — GLUCOSE, CAPILLARY
GLUCOSE-CAPILLARY: 137 mg/dL — AB (ref 70–99)
Glucose-Capillary: 142 mg/dL — ABNORMAL HIGH (ref 70–99)
Glucose-Capillary: 153 mg/dL — ABNORMAL HIGH (ref 70–99)
Glucose-Capillary: 158 mg/dL — ABNORMAL HIGH (ref 70–99)

## 2014-09-07 LAB — AMMONIA: Ammonia: 74 umol/L — ABNORMAL HIGH (ref 11–32)

## 2014-09-07 MED ORDER — LACTULOSE 10 GM/15ML PO SOLN
10.0000 g | Freq: Three times a day (TID) | ORAL | Status: DC
  Administered 2014-09-07 (×2): 10 g via ORAL
  Filled 2014-09-07 (×2): qty 15

## 2014-09-07 NOTE — Evaluation (Signed)
Physical Therapy Evaluation Patient Details Name: Diana Henderson MRN: [id number] DOB: Jul 01, 1970 Today's Date: 09/07/2014   History of Present Illness  Patient is a 44 y/o female with PMH of  DM, HTN, HLD and bipolar 1 disorder, admitted on 3/18 for scheduled ACDF secondary to bulging disc at C5. During induction her SBP went up to 240-260s, she was tachycardic and refractory to treatment. Surgery was cancelled. Ammonia slightly elevated at 69. MRI shows no acute changes to account for encephalopathy.    Clinical Impression  Patient presents with generalized weakness, impaired sensation BUEs/LEs, delayed processing time, balance deficits and impaired endurance impacting safe mobility. Very slow gait speed requiring use of RW for support. Pt would benefit from skilled PT to improve transfers, gait, balance and mobility so pt can maximize independence. Possible need for surgery?    Follow Up Recommendations Home health PT;Supervision/Assistance - 24 hour    Equipment Recommendations  Rolling walker with 5" wheels;Other (comment) (shower chair)    Recommendations for Other Services OT consult     Precautions / Restrictions Precautions Precautions: Fall Restrictions Weight Bearing Restrictions: No      Mobility  Bed Mobility Overal bed mobility: Needs Assistance             General bed mobility comments: Sitting in chair upon PT arrival.   Transfers Overall transfer level: Needs assistance Equipment used: Rolling walker (2 wheeled) Transfers: Sit to/from Stand Sit to Stand: Min guard         General transfer comment: Min guard for safety. Cues for hand placement. Stood from [employer], from chair x1.   Ambulation/Gait Ambulation/Gait assistance: Min guard Ambulation Distance (Feet): 15 Feet (x2 bouts) Assistive device: Rolling walker (2 wheeled) Gait Pattern/deviations: Step-through pattern;Decreased stride length;Trunk flexed Gait velocity: decreased   General  Gait Details: Pt with very slow, steady gait. Fatigues easily requiring 1 seated rest break. Vitals stable.  Stairs            Wheelchair Mobility    Modified Rankin (Stroke Patients Only)       Balance Overall balance assessment: Needs assistance Sitting-balance support: Feet supported;No upper extremity supported Sitting balance-Leahy Scale: Fair     Standing balance support: During functional activity Standing balance-Leahy Scale: Fair                               Pertinent Vitals/Pain Pain Assessment: Faces Faces Pain Scale: Hurts even more Pain Location: "everywhere" Pain Descriptors / Indicators: Sore Pain Intervention(s): Monitored during session;Repositioned    Home Living Family/patient expects to be discharged to:: Private residence Living Arrangements: Children Available Help at Discharge: Family;Available 24 hours/day Type of Home: Apartment Home Access: Level entry     Home Layout: One level Home Equipment: Cane - single point      Prior Function Level of Independence: Independent with assistive device(s)         Comments: Pt using SPC PTA. Requires assist getting in/out of bathtub, cannot stand long enough to shower.     Hand Dominance   Dominant Hand: Right    Extremity/Trunk Assessment   Upper Extremity Assessment: LUE deficits/detail       LUE Deficits / Details: Reports numbness in median nerve distribution. Weak grip strength bil.   Lower Extremity Assessment: Generalized weakness;RLE deficits/detail;LLE deficits/detail RLE Deficits / Details: Reports tingling in BLEs LLE Deficits / Details: Reports tingling in BLEs.     Communication   Communication:  No difficulties  Cognition Arousal/Alertness: Awake/alert Behavior During Therapy: WFL for tasks assessed/performed Overall Cognitive Status: Impaired/Different from baseline Area of Impairment: Problem solving             Problem Solving: Slow  processing      General Comments      Exercises        Assessment/Plan    PT Assessment Patient needs continued PT services  PT Diagnosis Difficulty walking;Generalized weakness;Acute pain   PT Problem List Decreased strength;Pain;Impaired sensation;Decreased activity tolerance;Decreased balance;Decreased mobility  PT Treatment Interventions Balance training;Gait training;Patient/family education;Functional mobility training;Therapeutic activities;Therapeutic exercise   PT Goals (Current goals can be found in the Care Plan section) Acute Rehab PT Goals Patient Stated Goal: to return home ASAP PT Goal Formulation: With patient Time For Goal Achievement: 09/21/14 Potential to Achieve Goals: Fair    Frequency Min 3X/week   Barriers to discharge        Co-evaluation               End of Session Equipment Utilized During Treatment: Gait belt Activity Tolerance: Patient limited by fatigue;Patient tolerated treatment well Patient left: in chair;with call bell/phone within reach;with family/visitor present;with chair alarm set Nurse Communication: Mobility status         Time: 1638-4536 PT Time Calculation (min) (ACUTE ONLY): 21 min   Charges:   PT Evaluation $Initial PT Evaluation Tier I: 1 Procedure     PT G CodesCandy Sledge A 09/10/2014, 3:36 PM Candy Sledge, [place], DPT [phone]

## 2014-09-07 NOTE — Progress Notes (Addendum)
[facility] progress note   Name: Diana Henderson MRN: [id number] DOB: 09-Jul-1970    ADMISSION DATE:  09/03/2014  REFERRING MD :  NS  ESSMENT / PLAN:  Metabolic/hypertensive encephalopathy Plan: - f/u ammonia, level of increasing, EEG within normal limits Slowly improving after improvement in the patient's blood pressure, MRI negative, EEG negative, ammonia trending down, neurology exam is nonfocal Continue lactulose, f/u LFT's,  Abdominal ultrasound negative PT/OT  Hyperammonemia Increase lactulose to 10 mg 3 times a day  Lt arm weakness 2nd to cervical spine compression. Plan: - f/u with neurosurgery, Dr. Consuella Lose, MD  Malignant HTN >> improved. - continue norvasc, tenormin, prinivil  Hx of OSA, asthma. - Need to ensure CPAP qhs - continue symbicort  Hx of GERD. Continue Protonix    [facility]   assumed care 3/22 and PCCM off. Disposition anticipate discharge in one to 2 days   INITIAL PRESENTATION:  44 yo with lt hand numbness from cervical spondylosis with C5-6 cord compression developed malignant HTN during induction for anesthesia.  STUDIES:  3/19 ct head>>neg 3/19 eeg>>> 3/20 mri>>>neg  SIGNIFICANT EVENTS: 3/18 cervical disc surgery cancelled due to htn after induction   SUBJECTIVE:  Patient is aware that she is in the hospital and is aware that the patient was admitted because of failed induction for her spinal surgery, cannot recall the name of the surgeon  VITAL SIGNS: Temp:  [97.7 F (36.5 C)-98.9 F (37.2 C)] 97.7 F (36.5 C) (03/22 1028) Pulse Rate:  [57-72] 59 (03/22 1028) Resp:  [18-20] 20 (03/22 1028) BP: (110-160)/(45-93) 130/72 mmHg (03/22 1028) SpO2:  [95 %-98 %] 98 % (03/22 1028) INTAKE / OUTPUT:  Intake/Output Summary (Last 24 hours) at 09/07/14 1249 Last data filed at 09/07/14 0310  Gross per 24 hour  Intake      0 ml  Output    600 ml  Net   -600 ml    PHYSICAL EXAMINATION: General:  MOAA, no distress Neuro:  Follows  commands, moves all arms legs, more alert HEENT:  Short neck, less jvd Cardiovascular:  HSR RRR Lungs:  CTa reduced Abdomen:  Obese Musculoskeletal:  intact Skin:  Mild lower ext edema  LABS:  CBC  Recent Labs Lab 09/03/14 2045 09/04/14 0555 09/07/14 0731  WBC 11.4* 7.0 5.1  HGB 13.2 12.0 13.4  HCT 40.1 37.8 42.0  PLT 206 179 189   BMET  Recent Labs Lab 09/05/14 0300 09/06/14 0730 09/07/14 0731  NA 135 136 136  K 4.2 4.2 4.3  CL 101 102 100  CO2 24 27 27   BUN 6 7 6   CREATININE 1.11* 1.09 1.07  GLUCOSE 159* 123* 145*   Electrolytes  Recent Labs Lab 09/04/14 0555 09/05/14 0300 09/06/14 0730 09/07/14 0731  CALCIUM 8.2* 8.9 8.8 8.9  MG 1.7  --   --   --   PHOS 3.5  --   --   --    ABG  Recent Labs Lab 09/03/14 0846  PHART 7.349*  PCO2ART 49.2*  PO2ART 332.0*   Liver Enzymes  Recent Labs Lab 09/03/14 0930 09/05/14 0300 09/07/14 0731  AST 90* 40* 49*  ALT 79* 46* 47*  ALKPHOS 75 64 59  BILITOT 0.7 0.8 0.6  ALBUMIN 3.6 3.4* 3.2*   Cardiac Enzymes  Recent Labs Lab 09/03/14 0930 09/03/14 1400  TROPONINI <0.03 <0.03   Glucose  Recent Labs Lab 09/06/14 0645 09/06/14 1151 09/06/14 1626 09/06/14 2203 09/07/14 0656 09/07/14 1151  GLUCAP 127* 127* 118* 135* 137* 142*  Imaging US Abdomen Limited Ruq  09/06/2014   CLINICAL DATA:  Elevated liver enzymes  EXAM: US ABDOMEN LIMITED - RIGHT UPPER QUADRANT  COMPARISON:  01/13/2014  FINDINGS: Gallbladder:  No gallstones or wall thickening visualized. No sonographic Murphy sign noted.  Common bile duct:  Diameter: 4.3 mm in diameter within normal limits.  Liver:  No focal lesion identified. Again noted diffuse increased echogenicity of the liver consistent with fatty infiltration.  IMPRESSION: 1. No gallstones are noted within gallbladder.  Normal CBD. 2. Again noted fatty infiltration of the liver.   Electronically Signed   By: Lahoma Crocker M.D.   On: 09/06/2014 15:57       Jeni Duling    09/07/2014, 12:49 PM Pager:  [phone]

## 2014-09-07 NOTE — Progress Notes (Signed)
Pt complained of bladder being full and not being able to void. Bladder scanned performed and revealed 448ml of urine. Will in and out cath x 1, and continue to monitor. Fortino Sic, RN, BSN 09/07/2014 2:41 AM

## 2014-09-07 NOTE — Progress Notes (Signed)
In and Cath yielded 600 cc's of urine. Pt tolerated procedure well. States that she feels much better. Will continue to monitor. Fortino Sic, RN, BSN 09/07/2014 3:13 AM

## 2014-09-07 NOTE — Progress Notes (Signed)
Subjective: patietn is comfortable and watching TV. She knows she is in the hospital and tells me she is unsure why but knows people continue to tell her it was for cervical surgery.  She does recall her left hand was tingling and that is why she needed surgery.   Objective: Current vital signs: BP 130/72 mmHg  Pulse 59  Temp(Src) 97.7 F (36.5 C) (Oral)  Resp 20  Ht 5\' 6"  (1.676 m)  Wt 134 kg (295 lb 6.7 oz)  BMI 47.70 kg/m2  SpO2 98%  LMP 08/29/2014 Vital signs in last 24 hours: Temp:  [97.7 F (36.5 C)-98.9 F (37.2 C)] 97.7 F (36.5 C) (03/22 1028) Pulse Rate:  [57-104] 59 (03/22 1028) Resp:  [18-20] 20 (03/22 1028) BP: (108-160)/(45-93) 130/72 mmHg (03/22 1028) SpO2:  [95 %-98 %] 98 % (03/22 1028)  Intake/Output from previous day: 03/21 0701 - 03/22 0700 In: -  Out: 600 [Urine:600] Intake/Output this shift:   Nutritional status: Diet Carb Modified  Neurologic Exam: General: Mental Status: Alert, oriented to hospital and month.  Speech fluent without evidence of aphasia.  Able to follow 3 step commands without difficulty. Cranial Nerves: II: Discs flat bilaterally; Visual fields grossly normal, pupils equal, round, reactive to light and accommodation III,IV, VI: ptosis not present, extra-ocular motions intact bilaterally V,VII: smile symmetric, facial light touch sensation normal bilaterally VIII: hearing normal bilaterally IX,X: uvula rises symmetrically XI: bilateral shoulder shrug XII: midline tongue extension without atrophy or fasciculations  Motor: Right : Upper extremity   5/5    Left:     Upper extremity   5/5  Lower extremity   5/5     Lower extremity   5/5 Tone and bulk:normal tone throughout; no atrophy noted Sensory: Pinprick and light touch intact throughout, bilaterally Deep Tendon Reflexes:  Right: Upper Extremity   Left: Upper extremity   biceps (C-5 to C-6) 2/4   biceps (C-5 to C-6) 2/4 tricep (C7) 2/4    triceps (C7) 2/4 Brachioradialis  (C6) 2/4  Brachioradialis (C6) 2/4  Lower Extremity Lower Extremity  quadriceps (L-2 to L-4) 2/4   quadriceps (L-2 to L-4) 2/4 Achilles (S1) 2/4   Achilles (S1) 2/4  Plantars: Right: downgoing   Left: downgoing    Lab Results: Basic Metabolic Panel:  Recent Labs Lab 09/03/14 0930 09/03/14 2045 09/04/14 0555 09/05/14 0300 09/06/14 0730 09/07/14 0731  NA 136  --  135 135 136 136  K 4.2  --  3.9 4.2 4.2 4.3  CL 104  --  102 101 102 100  CO2 25  --  28 24 27 27   GLUCOSE 227*  --  177* 159* 123* 145*  BUN <5*  --  <5* 6 7 6   CREATININE 1.08 1.04 1.12* 1.11* 1.09 1.07  CALCIUM 8.7  --  8.2* 8.9 8.8 8.9  MG  --   --  1.7  --   --   --   PHOS  --   --  3.5  --   --   --     Liver Function Tests:  Recent Labs Lab 09/03/14 0930 09/05/14 0300 09/07/14 0731  AST 90* 40* 49*  ALT 79* 46* 47*  ALKPHOS 75 64 59  BILITOT 0.7 0.8 0.6  PROT 6.6 6.4 6.3  ALBUMIN 3.6 3.4* 3.2*   No results for input(s): LIPASE, AMYLASE in the last 168 hours.  Recent Labs Lab 09/05/14 1318 09/06/14 1228  AMMONIA 69* 48*    CBC:  Recent Labs  Lab 09/03/14 0846 09/03/14 2045 09/04/14 0555 09/07/14 0731  WBC  --  11.4* 7.0 5.1  HGB 13.6 13.2 12.0 13.4  HCT 40.0 40.1 37.8 42.0  MCV  --  85.3 88.1 88.4  PLT  --  206 179 189    Cardiac Enzymes:  Recent Labs Lab 09/03/14 0930 09/03/14 1400 09/05/14 1318  CKTOTAL 125  --  95  CKMB 1.8  --   --   TROPONINI <0.03 <0.03  --     Lipid Panel: No results for input(s): CHOL, TRIG, HDL, CHOLHDL, VLDL, LDLCALC in the last 168 hours.  CBG:  Recent Labs Lab 09/06/14 0645 09/06/14 1151 09/06/14 1626 09/06/14 2203 09/07/14 0656  GLUCAP 127* 127* 118* 135* 37*    Microbiology: Results for orders placed or performed during the hospital encounter of 08/20/14  Surgical pcr screen     Status: None   Collection Time: 08/20/14 10:19 AM  Result Value Ref Range Status   MRSA, PCR NEGATIVE NEGATIVE Final   Staphylococcus aureus  NEGATIVE NEGATIVE Final    Comment:        The Xpert SA Assay (FDA approved for NASAL specimens in patients over 57 years of age), is one component of a comprehensive surveillance program.  Test performance has been validated by [facility] for patients greater than or equal to 32 year old. It is not intended to diagnose infection nor to guide or monitor treatment.     Coagulation Studies: No results for input(s): LABPROT, INR in the last 72 hours.  Imaging: US Abdomen Limited Ruq  09/06/2014   CLINICAL DATA:  Elevated liver enzymes  EXAM: US ABDOMEN LIMITED - RIGHT UPPER QUADRANT  COMPARISON:  01/13/2014  FINDINGS: Gallbladder:  No gallstones or wall thickening visualized. No sonographic Murphy sign noted.  Common bile duct:  Diameter: 4.3 mm in diameter within normal limits.  Liver:  No focal lesion identified. Again noted diffuse increased echogenicity of the liver consistent with fatty infiltration.  IMPRESSION: 1. No gallstones are noted within gallbladder.  Normal CBD. 2. Again noted fatty infiltration of the liver.   Electronically Signed   By: Lahoma Crocker M.D.   On: 09/06/2014 15:57    Medications:  Scheduled: . amLODipine  10 mg Oral Daily  . atenolol  50 mg Oral Daily  . budesonide-formoterol  2 puff Inhalation BID  . docusate sodium  100 mg Oral BID  . DULoxetine  60 mg Oral QHS  . heparin  5,000 Units Subcutaneous 3 times per day  . insulin aspart  0-20 Units Subcutaneous TID WC  . lactulose  10 g Oral Daily  . lisinopril  20 mg Oral Daily  . pantoprazole  40 mg Oral Daily  . senna  1 tablet Oral BID    Assessment/Plan:  43y/o woman presented for planned cervical disc surgery which was cancelled due to marked hypertension after anesthetic induction. Since then patients blood pressure has returned to baseline. She knows she is in the hospital and she is here fror cervical surgery.  She is unable to tell me her surgeons name but able to state her symptoms.  Her  neurology exam is non-focal. MRI is normal.  EEG is normal. Ammonia is trending down. No further neurological intervention is indicated point. We will plan to see her in follow-up on an as-needed basis.  Etta Quill PA-C [facility] [phone]   I personally anticipated in this patient's evaluation and management, including the above clinical impression and treatment recommendations.  Rush Farmer M.D. [facility] [phone]  09/07/2014, 11:06 AM

## 2014-09-08 DIAGNOSIS — G9341 Metabolic encephalopathy: Secondary | ICD-10-CM | POA: Diagnosis present

## 2014-09-08 DIAGNOSIS — I674 Hypertensive encephalopathy: Secondary | ICD-10-CM

## 2014-09-08 DIAGNOSIS — M79602 Pain in left arm: Secondary | ICD-10-CM

## 2014-09-08 DIAGNOSIS — R29898 Other symptoms and signs involving the musculoskeletal system: Secondary | ICD-10-CM | POA: Diagnosis present

## 2014-09-08 DIAGNOSIS — I10 Essential (primary) hypertension: Secondary | ICD-10-CM | POA: Diagnosis present

## 2014-09-08 DIAGNOSIS — E7220 Disorder of urea cycle metabolism, unspecified: Secondary | ICD-10-CM | POA: Clinically undetermined

## 2014-09-08 LAB — COMPREHENSIVE METABOLIC PANEL
ALT: 55 U/L — ABNORMAL HIGH (ref 0–35)
AST: 54 U/L — ABNORMAL HIGH (ref 0–37)
Albumin: 3.2 g/dL — ABNORMAL LOW (ref 3.5–5.2)
Alkaline Phosphatase: 59 U/L (ref 39–117)
Anion gap: 8 (ref 5–15)
BUN: <5 mg/dL — ABNORMAL LOW (ref 6–23)
CALCIUM: 9.1 mg/dL (ref 8.4–10.5)
CO2: 25 mmol/L (ref 19–32)
Chloride: 103 mmol/L (ref 96–112)
Creatinine, Ser: 0.96 mg/dL (ref 0.50–1.10)
GFR calc non Af Amer: 70 mL/min — ABNORMAL LOW (ref >90)
GFR, EST AFRICAN AMERICAN: 82 mL/min — AB (ref >90)
Glucose, Bld: 105 mg/dL — ABNORMAL HIGH (ref 70–99)
Potassium: 4.1 mmol/L (ref 3.5–5.1)
Sodium: 136 mmol/L (ref 135–145)
Total Bilirubin: 0.7 mg/dL (ref 0.3–1.2)
Total Protein: 6.4 g/dL (ref 6.0–8.3)

## 2014-09-08 LAB — AMMONIA: Ammonia: 75 umol/L — ABNORMAL HIGH (ref 11–32)

## 2014-09-08 LAB — GLUCOSE, CAPILLARY
GLUCOSE-CAPILLARY: 170 mg/dL — AB (ref 70–99)
GLUCOSE-CAPILLARY: 192 mg/dL — AB (ref 70–99)
Glucose-Capillary: 129 mg/dL — ABNORMAL HIGH (ref 70–99)
Glucose-Capillary: 159 mg/dL — ABNORMAL HIGH (ref 70–99)

## 2014-09-08 LAB — METANEPHRINES, URINE, 24 HOUR
Metaneph Total, Ur: 705 mcg/24 h (ref 182–739)
Metanephrines, Ur: 126 mcg/24 h (ref 58–203)
Normetanephrine, 24H Ur: 579 mcg/24 h (ref 88–649)
VOLUME, URINE-METAN: 3300 mL

## 2014-09-08 MED ORDER — LACTULOSE 10 GM/15ML PO SOLN: 20.0000 g | Freq: Three times a day (TID) | ORAL | Status: DC

## 2014-09-08 MED ORDER — LACTULOSE 10 GM/15ML PO SOLN
30.0000 g | Freq: Three times a day (TID) | ORAL | Status: DC
  Administered 2014-09-08 – 2014-09-10 (×7): 30 g via ORAL
  Filled 2014-09-08 (×8): qty 45

## 2014-09-08 NOTE — Progress Notes (Signed)
[facility] PROGRESS NOTE  Diana Henderson WLS:[id number] DOB: Aug 09, 1970 DOA: 09/03/2014 PCP: Benito Mccreedy, MD  Assessment/Plan: #1 acute metabolic encephalopathy/hypertensive encephalopathy Clinical improvement. MRI head negative for any acute infarct. CT head negative for any acute infarct. EEG is normal. Ammonia levels were elevated but are trending down. Increase lactulose to 30 mL 3 times a day. Blood pressure better improved. Continue current blood pressure regimen. Follow.  #2 malignant hypertension Improved on current regimen. Continue Norvasc, atenolol, lisinopril.  #3 hyperammonemia Trending down. Patient noted to have a fatty liver per right upper quadrant ultrasound. Increase lactulose to 30 g 3 times a day. And for 2-3 bowel movements daily. Follow.  #4 left arm weakness secondary to cervical spine compression Surgery was canceled after patient had hemodynamic instability after induction of anesthesia. Patient will need to follow-up with neurosurgery as outpatient.  #5 history of obstructive sleep apnea/asthma Continue Symbicort. See Pap daily at bedtime.  #6 gastroesophageal reflux disease  PPI.  #7 prophylaxis PPI for GI prophylaxis. Heparin for DVT prophylaxis.  Code Status: Full Family Communication: Updated patient no family at bedside. Disposition Plan: Remain inpatient.   Consultants:  PCCM: Dr. Nelda Marseille 09/03/2014, Dr. Titus Mould 09/04/2014  Neurology: Dr. Janann Colonel 09/05/2014  Procedures:  EEG 09/06/2014  CT head 09/04/2014  MRI head 09/05/2014  Chest x-ray 09/03/2014, 09/05/2014  2-D echo 09/06/2014  Right upper quadrant ultrasound 09/06/2014  Antibiotics:  None  HPI/Subjective: Patient alert and oriented 4. Patient following commands. Patient less lethargic than she was this morning.  Objective: Filed Vitals:   09/08/14 1433  BP: 129/67  Pulse: 68  Temp: 98.4 F (36.9 C)  Resp: 18   No intake or output data in the 24  hours ending 09/08/14 1705 Filed Weights   09/03/14 0628 09/03/14 2000  Weight: 127.007 kg (280 lb) 134 kg (295 lb 6.7 oz)    Exam:   General:  NAD  Cardiovascular: RRR  Respiratory: CTAB  Abdomen: Soft, nontender, nondistended, positive bowel sounds.  Musculoskeletal: No clubbing cyanosis or edema.  Data Reviewed: Basic Metabolic Panel:  Recent Labs Lab 09/04/14 0555 09/05/14 0300 09/06/14 0730 09/07/14 0731 09/08/14 0550  NA 135 135 136 136 136  K 3.9 4.2 4.2 4.3 4.1  CL 102 101 102 100 103  CO2 28 24 27 27 25   GLUCOSE 177* 159* 123* 145* 105*  BUN <5* 6 7 6  <5*  CREATININE 1.12* 1.11* 1.09 1.07 0.96  CALCIUM 8.2* 8.9 8.8 8.9 9.1  MG 1.7  --   --   --   --   PHOS 3.5  --   --   --   --    Liver Function Tests:  Recent Labs Lab 09/03/14 0930 09/05/14 0300 09/07/14 0731 09/08/14 0550  AST 90* 40* 49* 54*  ALT 79* 46* 47* 55*  ALKPHOS 75 64 59 59  BILITOT 0.7 0.8 0.6 0.7  PROT 6.6 6.4 6.3 6.4  ALBUMIN 3.6 3.4* 3.2* 3.2*   No results for input(s): LIPASE, AMYLASE in the last 168 hours.  Recent Labs Lab 09/05/14 1318 09/06/14 1228 09/07/14 1026 09/08/14 0530  AMMONIA 69* 48* 74* 75*   CBC:  Recent Labs Lab 09/03/14 0846 09/03/14 2045 09/04/14 0555 09/07/14 0731  WBC  --  11.4* 7.0 5.1  HGB 13.6 13.2 12.0 13.4  HCT 40.0 40.1 37.8 42.0  MCV  --  85.3 88.1 88.4  PLT  --  206 179 189   Cardiac Enzymes:  Recent Labs Lab 09/03/14 0930 09/03/14 1400 09/05/14  1318  CKTOTAL 125  --  95  CKMB 1.8  --   --   TROPONINI <0.03 <0.03  --    BNP (last 3 results)  Recent Labs  08/24/14 2118  BNP 8.8    ProBNP (last 3 results)  Recent Labs  01/13/14 0845 05/17/14 0449  PROBNP 13.3 24.3    CBG:  Recent Labs Lab 09/07/14 1643 09/07/14 2115 09/08/14 0650 09/08/14 1114 09/08/14 1632  GLUCAP 153* 158* 129* 159* 170*    No results found for this or any previous visit (from the past 240 hour(s)).   Studies: No results  found.  Scheduled Meds: . amLODipine  10 mg Oral Daily  . atenolol  50 mg Oral Daily  . budesonide-formoterol  2 puff Inhalation BID  . docusate sodium  100 mg Oral BID  . DULoxetine  60 mg Oral QHS  . heparin  5,000 Units Subcutaneous 3 times per day  . insulin aspart  0-20 Units Subcutaneous TID WC  . lactulose  30 g Oral TID  . lisinopril  20 mg Oral Daily  . pantoprazole  40 mg Oral Daily  . senna  1 tablet Oral BID   Continuous Infusions:   Principal Problem:   Encephalopathy, hypertensive Active Problems:   Encephalopathy, metabolic   HTN (hypertension), malignant   Major depressive disorder, recurrent, with catatonic features   Alcohol abuse   Hyperlipidemia   Essential hypertension   Diabetes mellitus type 2, uncomplicated   OSA (obstructive sleep apnea)   Arm pain   Headache   Lethargic   Hyperammonemia   Left arm weakness    Time spent: 77 minutes    THOMPSON,DANIEL M.D. [facility] Pager [phone]. If 7PM-7AM, please contact night-coverage at [email or web address], password [facility] 09/08/2014, 5:05 PM  LOS: 5 days

## 2014-09-08 NOTE — Progress Notes (Signed)
RT placed pt on CPAP for the night. Pt tolerating well at this time.  

## 2014-09-08 NOTE — Progress Notes (Signed)
Occupational Therapy Evaluation Patient Details Name: Diana Henderson MRN: [id number] DOB: 05-08-1971 Today's Date: 09/08/2014    History of Present Illness Patient is a 44 y/o female with PMH of  DM, HTN, HLD and bipolar 1 disorder, admitted on 3/18 for scheduled ACDF secondary to bulging disc at C5. During induction her SBP went up to 240-260s, she was tachycardic and refractory to treatment. Surgery was cancelled. Ammonia slightly elevated at 69. MRI shows no acute changes to account for encephalopathy.   Clinical Impression   PTA, pt was mod i with ADL and mobility @ [place] level. Pt currently requires Min A with ADL and S with mobility. Will continue to follow acutely and modify POC as needed if pt has surgery. At this time, pt appropriate for D/C home with initial 24/7 S when medically stable.     Follow Up Recommendations  No OT follow up;Supervision/Assistance - 24 hour (initially) - will further assess if pt has surgery   Equipment Recommendations  Tub bench   Recommendations for Other Services       Precautions / Restrictions Precautions Precautions: Fall      Mobility Bed Mobility Overal bed mobility: Needs Assistance Bed Mobility: Sidelying to Sit;Sit to Sidelying   Sidelying to sit: Supervision     Sit to sidelying: Supervision General bed mobility comments: Pt educated on log rolling techniques in preparation for neck surgery  Transfers Overall transfer level: Needs assistance Equipment used: Rolling walker (2 wheeled) Transfers: Sit to/from [employer] Sit to Stand: Supervision Stand pivot transfers: Supervision            Balance     Sitting balance-Leahy Scale: Good       Standing balance-Leahy Scale: Fair                              ADL Overall ADL's : Needs assistance/impaired Eating/Feeding: Independent   Grooming: Set up   Upper Body Bathing: Set up;Sitting   Lower Body Bathing: Supervison/ safety;Set  up;Sit to/from stand Lower Body Bathing Details (indicate cue type and reason): with leaning over Upper Body Dressing : Set up;Sitting   Lower Body Dressing: Minimal assistance;Sit to/from stand   Toilet Transfer: Supervision/safety;Ambulation;Comfort height toilet;RW;Grab bars   Toileting- Clothing Manipulation and Hygiene: Supervision/safety;Sit to/from stand       Functional mobility during ADLs: Supervision/safety;Rolling walker General ADL Comments: At this time, pt is able to lean over tocomplete LB ADL. If pt has cervical surgery, she will most likely need to use AE for LB ADL     Vision     Perception     Praxis      Pertinent Vitals/Pain Pain Assessment: Faces Faces Pain Scale: Hurts little more Pain Location: L hand/neck Pain Descriptors / Indicators: Sore Pain Intervention(s): Limited activity within patient's tolerance;Monitored during session;Repositioned     Hand Dominance Right   Extremity/Trunk Assessment Upper Extremity Assessment Upper Extremity Assessment: LUE deficits/detail LUE Deficits / Details: c/o numbness in index and thumb; c/o tingling. states she drops money and has a hard time holding onto things LUE Sensation: decreased light touch LUE Coordination: decreased fine motor   Lower Extremity Assessment Lower Extremity Assessment: Generalized weakness   Cervical / Trunk Assessment Cervical / Trunk Assessment: Normal   Communication Communication Communication: No difficulties   Cognition Arousal/Alertness: Awake/alert Behavior During Therapy: Flat affect Overall Cognitive Status: No family/caregiver present to determine baseline cognitive functioning Area of Impairment:  (will  further assess)  Appears slow with processing                 General Comments       Exercises Exercises: Other exercises Other Exercises Other Exercises: theraputty exercises for grip and pinch strengthening (med)   Shoulder Instructions       Home Living Family/patient expects to be discharged to:: Private residence Living Arrangements: Children Available Help at Discharge: Family;Available 24 hours/day Type of Home: Apartment Home Access: Level entry     Home Layout: One level     Bathroom Shower/Tub: Tub/shower unit Shower/tub characteristics: Architectural technologist: Standard Bathroom Accessibility: Yes How Accessible: Accessible via walker Home Equipment: Cane - single point          Prior Functioning/Environment Level of Independence: Independent with assistive device(s)        Comments: Pt using SPC PTA. Requires assist getting in/out of bathtub, cannot stand long enough to shower.    OT Diagnosis: Generalized weakness;Acute pain   OT Problem List: Decreased strength;Decreased activity tolerance;Impaired balance (sitting and/or standing);Decreased coordination;Decreased knowledge of use of DME or AE;Decreased knowledge of precautions;Obesity;Impaired sensation;Impaired UE functional use;Pain   OT Treatment/Interventions: Self-care/ADL training;Therapeutic exercise;DME and/or AE instruction;Therapeutic activities;Patient/family education;Balance training    OT Goals(Current goals can be found in the care plan section) Acute Rehab OT Goals Patient Stated Goal: to return home ASAP OT Goal Formulation: With patient Time For Goal Achievement: 09/22/14 Potential to Achieve Goals: Good  OT Frequency: Min 2X/week   Barriers to D/C:            Co-evaluation              End of Session Equipment Utilized During Treatment: Rolling walker Nurse Communication: Mobility status  Activity Tolerance: Patient tolerated treatment well Patient left: in bed;with call bell/phone within reach;with bed alarm set   Time: 1537-1600 OT Time Calculation (min): 23 min Charges:  OT General Charges $OT Visit: 1 Procedure OT Evaluation $Initial OT Evaluation Tier I: 1 Procedure OT Treatments $Self Care/Home  Management : 8-22 mins G-Codes:    Bryan Omura,HILLARY 09/19/2014, 4:19 PM   [facility], OTR/L  [phone] 09-19-14

## 2014-09-08 NOTE — Progress Notes (Signed)
Spoke with pt when giving her Tx, and told her to let the RN know when she was ready to go on her CPAP. Unit is in the room.

## 2014-09-09 LAB — COMPREHENSIVE METABOLIC PANEL
ALT: 61 U/L — ABNORMAL HIGH (ref 0–35)
AST: 52 U/L — AB (ref 0–37)
Albumin: 3.2 g/dL — ABNORMAL LOW (ref 3.5–5.2)
Alkaline Phosphatase: 57 U/L (ref 39–117)
Anion gap: 9 (ref 5–15)
BUN: 6 mg/dL (ref 6–23)
CALCIUM: 9.1 mg/dL (ref 8.4–10.5)
CO2: 26 mmol/L (ref 19–32)
Chloride: 102 mmol/L (ref 96–112)
Creatinine, Ser: 0.94 mg/dL (ref 0.50–1.10)
GFR calc Af Amer: 84 mL/min — ABNORMAL LOW (ref >90)
GFR, EST NON AFRICAN AMERICAN: 72 mL/min — AB (ref >90)
GLUCOSE: 135 mg/dL — AB (ref 70–99)
Potassium: 4 mmol/L (ref 3.5–5.1)
SODIUM: 137 mmol/L (ref 135–145)
Total Bilirubin: 0.5 mg/dL (ref 0.3–1.2)
Total Protein: 6.3 g/dL (ref 6.0–8.3)

## 2014-09-09 LAB — CBC
HEMATOCRIT: 42.3 % (ref 36.0–46.0)
Hemoglobin: 13.3 g/dL (ref 12.0–15.0)
MCH: 27.8 pg (ref 26.0–34.0)
MCHC: 31.4 g/dL (ref 30.0–36.0)
MCV: 88.3 fL (ref 78.0–100.0)
Platelets: 185 10*3/uL (ref 150–400)
RBC: 4.79 MIL/uL (ref 3.87–5.11)
RDW: 13.8 % (ref 11.5–15.5)
WBC: 5.3 10*3/uL (ref 4.0–10.5)

## 2014-09-09 LAB — GLUCOSE, CAPILLARY
GLUCOSE-CAPILLARY: 130 mg/dL — AB (ref 70–99)
Glucose-Capillary: 146 mg/dL — ABNORMAL HIGH (ref 70–99)
Glucose-Capillary: 133 mg/dL — ABNORMAL HIGH (ref 70–99)
Glucose-Capillary: 129 mg/dL — ABNORMAL HIGH (ref 70–99)

## 2014-09-09 LAB — AMMONIA: Ammonia: 52 umol/L — ABNORMAL HIGH (ref 11–32)

## 2014-09-09 NOTE — Progress Notes (Signed)
[facility] PROGRESS NOTE  Diana Henderson EUM:[id number] DOB: [birth year] DOA: 09/03/2014 PCP: Benito Mccreedy, MD  Assessment/Plan: #1 acute metabolic encephalopathy/hypertensive encephalopathy Clinical improvement. MRI head negative for any acute infarct. CT head negative for any acute infarct. EEG is normal. Ammonia levels were elevated but are trending down. Increased lactulose to 30 mL 3 times a day. Blood pressure better improved. Continue current blood pressure regimen. Follow.  #2 malignant hypertension Improved on current regimen. Continue Norvasc, atenolol, lisinopril.  #3 hyperammonemia Trending down. Patient noted to have a fatty liver per right upper quadrant ultrasound. IncreaseD lactulose to 30 g 3 times a day. Aim for 2-3 bowel movements daily. Follow.  #4 left arm weakness secondary to cervical spine compression Surgery was canceled after patient had hemodynamic instability after induction of anesthesia. Patient will need to follow-up with neurosurgery as outpatient.  #5 history of obstructive sleep apnea/asthma Continue Symbicort. CPAP daily at bedtime.  #6 gastroesophageal reflux disease  PPI.  #7 prophylaxis PPI for GI prophylaxis. Heparin for DVT prophylaxis.  Code Status: Full Family Communication: Updated patient no family at bedside. Disposition Plan: Remain inpatient.   Consultants:  PCCM: Dr. Nelda Marseille 09/03/2014, Dr. Titus Mould 09/04/2014  Neurology: Dr. Janann Colonel 09/05/2014  Procedures:  EEG 09/06/2014  CT head 09/04/2014  MRI head 09/05/2014  Chest x-ray 09/03/2014, 09/05/2014  2-D echo 09/06/2014  Right upper quadrant ultrasound 09/06/2014  Antibiotics:  None  HPI/Subjective: Patient alert and oriented 4. Patient following commands.   Objective: Filed Vitals:   09/09/14 0948  BP: 138/78  Pulse: 62  Temp: 98 F (36.7 C)  Resp: 18   No intake or output data in the 24 hours ending 09/09/14 1159 Filed Weights    09/03/14 0628 09/03/14 2000  Weight: 127.007 kg (280 lb) 134 kg (295 lb 6.7 oz)    Exam:   General:  NAD  Cardiovascular: RRR  Respiratory: CTAB  Abdomen: Soft, nontender, nondistended, positive bowel sounds.  Musculoskeletal: No clubbing cyanosis or edema.  Data Reviewed: Basic Metabolic Panel:  Recent Labs Lab 09/04/14 0555 09/05/14 0300 09/06/14 0730 09/07/14 0731 09/08/14 0550 09/09/14 0654  NA 135 135 136 136 136 137  K 3.9 4.2 4.2 4.3 4.1 4.0  CL 102 101 102 100 103 102  CO2 28 24 27 27 25 26   GLUCOSE 177* 159* 123* 145* 105* 135*  BUN <5* 6 7 6  <5* 6  CREATININE 1.12* 1.11* 1.09 1.07 0.96 0.94  CALCIUM 8.2* 8.9 8.8 8.9 9.1 9.1  MG 1.7  --   --   --   --   --   PHOS 3.5  --   --   --   --   --    Liver Function Tests:  Recent Labs Lab 09/03/14 0930 09/05/14 0300 09/07/14 0731 09/08/14 0550 09/09/14 0654  AST 90* 40* 49* 54* 52*  ALT 79* 46* 47* 55* 61*  ALKPHOS 75 64 59 59 57  BILITOT 0.7 0.8 0.6 0.7 0.5  PROT 6.6 6.4 6.3 6.4 6.3  ALBUMIN 3.6 3.4* 3.2* 3.2* 3.2*   No results for input(s): LIPASE, AMYLASE in the last 168 hours.  Recent Labs Lab 09/05/14 1318 09/06/14 1228 09/07/14 1026 09/08/14 0530 09/09/14 0854  AMMONIA 69* 48* 74* 75* 52*   CBC:  Recent Labs Lab 09/03/14 0846 09/03/14 2045 09/04/14 0555 09/07/14 0731 09/09/14 0654  WBC  --  11.4* 7.0 5.1 5.3  HGB 13.6 13.2 12.0 13.4 13.3  HCT 40.0 40.1 37.8 42.0 42.3  MCV  --  85.3 88.1 88.4 88.3  PLT  --  206 179 189 185   Cardiac Enzymes:  Recent Labs Lab 09/03/14 0930 09/03/14 1400 09/05/14 1318  CKTOTAL 125  --  95  CKMB 1.8  --   --   TROPONINI <0.03 <0.03  --    BNP (last 3 results)  Recent Labs  08/24/14 2118  BNP 8.8    ProBNP (last 3 results)  Recent Labs  01/13/14 0845 05/17/14 0449  PROBNP 13.3 24.3    CBG:  Recent Labs Lab 09/08/14 1114 09/08/14 1632 09/08/14 2216 09/09/14 0653 09/09/14 1144  GLUCAP 159* 170* 192* 130* 146*     No results found for this or any previous visit (from the past 240 hour(s)).   Studies: No results found.  Scheduled Meds: . amLODipine  10 mg Oral Daily  . atenolol  50 mg Oral Daily  . budesonide-formoterol  2 puff Inhalation BID  . docusate sodium  100 mg Oral BID  . DULoxetine  60 mg Oral QHS  . heparin  5,000 Units Subcutaneous 3 times per day  . insulin aspart  0-20 Units Subcutaneous TID WC  . lactulose  30 g Oral TID  . lisinopril  20 mg Oral Daily  . pantoprazole  40 mg Oral Daily  . senna  1 tablet Oral BID   Continuous Infusions:   Principal Problem:   Encephalopathy, hypertensive Active Problems:   Encephalopathy, metabolic   HTN (hypertension), malignant   Major depressive disorder, recurrent, with catatonic features   Alcohol abuse   Hyperlipidemia   Essential hypertension   Diabetes mellitus type 2, uncomplicated   OSA (obstructive sleep apnea)   Arm pain   Headache   Lethargic   Hyperammonemia   Left arm weakness    Time spent: 21 minutes    Diana Henderson M.D. [facility] Pager [phone]. If 7PM-7AM, please contact night-coverage at [email or web address], password [facility] 09/09/2014, 11:59 AM  LOS: 6 days

## 2014-09-09 NOTE — Progress Notes (Signed)
Patient states she is not ready to use CPAP at this time. RT has checked with patient twice tonight and patient has wanted to wait to use the machine both times. Patient says she will let RN know when she's ready to use the machine and RN can call RT. Patient is currently on RA, in no apparent distress, resting comfortably. RT will continue to assist as needed.

## 2014-09-09 NOTE — Progress Notes (Signed)
Physical Therapy Treatment Patient Details Name: Tywanna Seifer MRN: [id number] DOB: 21-Sep-1970 Today's Date: 09/09/2014    History of Present Illness Patient is a 44 y/o female with PMH of  DM, HTN, HLD and bipolar 1 disorder, admitted on 3/18 for scheduled ACDF secondary to bulging disc at C5. During induction her SBP went up to 240-260s, she was tachycardic and refractory to treatment. Surgery was cancelled. Ammonia slightly elevated at 69. MRI shows no acute changes to account for encephalopathy.    PT Comments    Patient showing some progress this session. Continues to be limited by fatigue. Will check on patient on Monday if she does not discharge over the weekend. Encourage daily ambulation with nursing  Follow Up Recommendations  Home health PT;Supervision/Assistance - 24 hour     Equipment Recommendations  Rolling walker with 5" wheels;Other (comment) (shower chair)    Recommendations for Other Services       Precautions / Restrictions Precautions Precautions: Fall    Mobility  Bed Mobility Overal bed mobility: Modified Independent                Transfers Overall transfer level: Modified independent                  Ambulation/Gait Ambulation/Gait assistance: Supervision Ambulation Distance (Feet): 50 Feet Assistive device: Rolling walker (2 wheeled) Gait Pattern/deviations: Step-through pattern     General Gait Details: Patient guarded and slightly slower with gait but no LOB.    Stairs            Wheelchair Mobility    Modified Rankin (Stroke Patients Only)       Balance                                    Cognition Arousal/Alertness: Awake/alert Behavior During Therapy: Flat affect Overall Cognitive Status: Within Functional Limits for tasks assessed                      Exercises      General Comments        Pertinent Vitals/Pain Pain Score: 6  Pain Location: neck Pain Descriptors /  Indicators: Sore Pain Intervention(s): Monitored during session    Home Living                      Prior Function            PT Goals (current goals can now be found in the care plan section) Progress towards PT goals: Progressing toward goals    Frequency       PT Plan      Co-evaluation             End of Session Equipment Utilized During Treatment: Gait belt Activity Tolerance: Patient tolerated treatment well;Patient limited by fatigue Patient left: in bed     Time: 6301-6010 PT Time Calculation (min) (ACUTE ONLY): 13 min  Charges:  $Gait Training: 8-22 mins                    G Codes:      Jacqualyn Posey 09/09/2014, 11:19 AM 09/09/2014 Jacqualyn Posey PTA [phone] pager [phone] office

## 2014-09-09 NOTE — Progress Notes (Signed)
UR COMPLETED  

## 2014-09-10 LAB — GLUCOSE, CAPILLARY
GLUCOSE-CAPILLARY: 144 mg/dL — AB (ref 70–99)
GLUCOSE-CAPILLARY: 206 mg/dL — AB (ref 70–99)
Glucose-Capillary: 126 mg/dL — ABNORMAL HIGH (ref 70–99)

## 2014-09-10 LAB — AMMONIA: Ammonia: 41 umol/L — ABNORMAL HIGH (ref 11–32)

## 2014-09-10 LAB — BASIC METABOLIC PANEL
ANION GAP: 9 (ref 5–15)
BUN: 7 mg/dL (ref 6–23)
CALCIUM: 9.4 mg/dL (ref 8.4–10.5)
CO2: 27 mmol/L (ref 19–32)
Chloride: 100 mmol/L (ref 96–112)
Creatinine, Ser: 1.02 mg/dL (ref 0.50–1.10)
GFR calc Af Amer: 76 mL/min — ABNORMAL LOW (ref >90)
GFR calc non Af Amer: 65 mL/min — ABNORMAL LOW (ref >90)
GLUCOSE: 181 mg/dL — AB (ref 70–99)
Potassium: 3.9 mmol/L (ref 3.5–5.1)
SODIUM: 136 mmol/L (ref 135–145)

## 2014-09-10 LAB — CBC
HEMATOCRIT: 42.2 % (ref 36.0–46.0)
Hemoglobin: 13.4 g/dL (ref 12.0–15.0)
MCH: 28 pg (ref 26.0–34.0)
MCHC: 31.8 g/dL (ref 30.0–36.0)
MCV: 88.1 fL (ref 78.0–100.0)
Platelets: 198 10*3/uL (ref 150–400)
RBC: 4.79 MIL/uL (ref 3.87–5.11)
RDW: 13.8 % (ref 11.5–15.5)
WBC: 5 10*3/uL (ref 4.0–10.5)

## 2014-09-10 MED ORDER — LISINOPRIL 20 MG PO TABS: 20.0000 mg | ORAL_TABLET | Freq: Every day | ORAL | Status: DC

## 2014-09-10 MED ORDER — ATENOLOL 50 MG PO TABS: 50.0000 mg | ORAL_TABLET | Freq: Every day | ORAL | Status: DC

## 2014-09-10 MED ORDER — HYDROCODONE-ACETAMINOPHEN 5-325 MG PO TABS: 1.0000 | ORAL_TABLET | ORAL | Status: DC | PRN

## 2014-09-10 MED ORDER — AMLODIPINE BESYLATE 10 MG PO TABS
10.0000 mg | ORAL_TABLET | Freq: Every day | ORAL | Status: DC
Start: 2014-09-10 — End: 2015-09-23

## 2014-09-10 MED ORDER — TIZANIDINE HCL 4 MG PO TABS: 4.0000 mg | ORAL_TABLET | Freq: Three times a day (TID) | ORAL | Status: DC | PRN

## 2014-09-10 MED ORDER — LACTULOSE 10 GM/15ML PO SOLN: 30.0000 g | Freq: Three times a day (TID) | ORAL | Status: DC

## 2014-09-10 NOTE — Discharge Summary (Signed)
Physician Discharge Summary  Diana Henderson ZDG:[id number] DOB: 28-Apr-1971 DOA: 09/03/2014  PCP: Benito Mccreedy, MD  Admit date: 09/03/2014 Discharge date: 09/10/2014  Time spent: 65 minutes  Recommendations for Outpatient Follow-up:  1. Follow-up with OSEI-BONSU,GEORGE, MD in 1 week. On Follow-up patient will need a basic metabolic profile done to follow-up on electrolytes and renal function. Patient also need an ammonia level checked. Patient blood pressure need to be reassessed as well as her diabetes. 2. Follow-up with Dr. Kathyrn Sheriff of neurosurgery in 2 weeks.  Discharge Diagnoses:  Principal Problem:   Encephalopathy, hypertensive Active Problems:   Encephalopathy, metabolic   HTN (hypertension), malignant   Major depressive disorder, recurrent, with catatonic features   Alcohol abuse   Hyperlipidemia   Essential hypertension   Diabetes mellitus type 2, uncomplicated   OSA (obstructive sleep apnea)   Arm pain   Headache   Lethargic   Hyperammonemia   Left arm weakness   Discharge Condition: Stable and improved  Diet recommendation: Carb modified  Filed Weights   09/03/14 0628 09/03/14 2000  Weight: 127.007 kg (280 lb) 134 kg (295 lb 6.7 oz)    History of present illness:  44 yo mo(280 lbs) with a plethora of health issues that include but not limited to , OSA(cpap dependent ) DM (poorly controlled) HTN(poorly controlled) bipolar, asthma, GERD who was scheduled ACD 3/18 for bulging disc C5 per Dr. Kathyrn Sheriff. During induction her bp was greater than 200 and was refractory to interventions. Surgery was canceled, extubated and transported to PACU with Cardene drip and SBP 140's. She required NIMS briefly But is now able to protect her airway.  Patient was followed by critical care while she was in the unit and when she was subsequently transferred to the floor care was transferred to try to hospitalists who followed the patient.   Hospital Course:  #1 acute  metabolic encephalopathy/hypertensive encephalopathy Patient was noted to have some bouts of confusion which was felt to be secondary to metabolic encephalopathy in hypertensive encephalopathy. Neurology was consulted and followed the patient during the hospitalization. MRI head negative for any acute infarct. CT head negative for any acute infarct. EEG is normal. Ammonia levels were elevated and patient was subsequently placed on lactulose and dose adjusted. Patient improved clinically and was back to baseline by day of discharge. Patient's blood pressure was managed and hypertensive urgency had resolved by day of discharge. Patient be discharged home on lactulose 3 times daily as well as her current blood pressure regimen. Patient will follow-up with PCP as outpatient.   #2 malignant hypertension Patient during anesthesia was noted to have malignant hypertension and placed on a Cardizem drip. Patient's systolic blood pressures improved. Patient was subsequent to transitioned to oral Norvasc, atenolol and lisinopril. Patient's blood pressure improved and malignant hypertension had resolved by day of discharge. Patient is to follow-up with PCP as outpatient.  #3 hyperammonemia Patient was noted to have elevated ammonia levels during the hospitalization which was felt to be contributing to her encephalopathy. Patient noted to have a fatty liver per right upper quadrant ultrasound. Patient was placed on lactulose and dose adjusted with decreasing levels of ammonia and resolution of her encephalopathy. Outpatient follow-up.  #4 left arm weakness secondary to cervical spine compression Surgery was canceled after patient had hemodynamic instability after induction of anesthesia. Patient will need to follow-up with neurosurgery as outpatient.  #5 history of obstructive sleep apnea/asthma Continued on Symbicort. CPAP daily at bedtime.  #6 gastroesophageal reflux disease  PPI.  Procedures:  EEG  09/06/2014  CT head 09/04/2014  MRI head 09/05/2014  Chest x-ray 09/03/2014, 09/05/2014  2-D echo 09/06/2014  Right upper quadrant ultrasound 09/06/2014    Consultations:  PCCM: Dr. Nelda Marseille 09/03/2014, Dr. Titus Mould 09/04/2014  Neurology: Dr. Janann Colonel 09/05/2014    Discharge Exam: Filed Vitals:   09/10/14 1303  BP: 128/77  Pulse: 52  Temp: 98.3 F (36.8 C)  Resp: 18    General: NAD Cardiovascular: RRR Respiratory: CTAB  Discharge Instructions   Discharge Instructions    Diet Carb Modified    Complete by:  As directed      Discharge instructions    Complete by:  As directed   Follow up with Dr Kathyrn Sheriff, neurosurgery in 2 weeks. Follow up with OSEI-BONSU,GEORGE, MD in 1 week.     Increase activity slowly    Complete by:  As directed           Current Discharge Medication List    START taking these medications   Details  HYDROcodone-acetaminophen (NORCO/VICODIN) 5-325 MG per tablet Take 1-2 tablets by mouth every 4 (four) hours as needed for moderate pain. Qty: 30 tablet, Refills: 0    lactulose (CHRONULAC) 10 GM/15ML solution Take 45 mLs (30 g total) by mouth 3 (three) times daily. Qty: 2000 mL, Refills: 0      CONTINUE these medications which have CHANGED   Details  amLODipine (NORVASC) 10 MG tablet Take 1 tablet (10 mg total) by mouth daily. Qty: 30 tablet, Refills: 0    atenolol (TENORMIN) 50 MG tablet Take 1 tablet (50 mg total) by mouth daily. Qty: 30 tablet, Refills: 0    lisinopril (PRINIVIL,ZESTRIL) 20 MG tablet Take 1 tablet (20 mg total) by mouth daily. Qty: 30 tablet, Refills: 0    tiZANidine (ZANAFLEX) 4 MG tablet Take 1 tablet (4 mg total) by mouth every 8 (eight) hours as needed for muscle spasms. Qty: 15 tablet, Refills: 0      CONTINUE these medications which have NOT CHANGED   Details  albuterol (PROVENTIL HFA;VENTOLIN HFA) 108 (90 BASE) MCG/ACT inhaler Inhale 2 puffs into the lungs every 6 (six) hours as needed for wheezing  or shortness of breath.    albuterol (PROVENTIL) (2.5 MG/3ML) 0.083% nebulizer solution Take 2.5 mg by nebulization every 6 (six) hours as needed for wheezing or shortness of breath.    budesonide-formoterol (SYMBICORT) 160-4.5 MCG/ACT inhaler Inhale 2 puffs into the lungs 2 (two) times daily.    DULoxetine (CYMBALTA) 60 MG capsule Take 60 mg by mouth at bedtime.     insulin NPH-regular Human (NOVOLIN 70/30) (70-30) 100 UNIT/ML injection Inject 30 Units into the skin 2 (two) times daily with a meal.    omeprazole (PRILOSEC) 20 MG capsule Take 20 mg by mouth daily.     ondansetron (ZOFRAN ODT) 8 MG disintegrating tablet Take 1 tablet (8 mg total) by mouth every 8 (eight) hours as needed for nausea or vomiting. Qty: 15 tablet, Refills: 0    trimethoprim-polymyxin b (POLYTRIM) ophthalmic solution Place 1 drop into the left eye every 4 (four) hours. Qty: 10 mL, Refills: 0      STOP taking these medications     beclomethasone (QVAR) 80 MCG/ACT inhaler      cyclobenzaprine (FLEXERIL) 10 MG tablet      Fluticasone-Salmeterol (ADVAIR DISKUS) 100-50 MCG/DOSE AEPB      olmesartan (BENICAR) 20 MG tablet      oxyCODONE-acetaminophen (PERCOCET) 10-325 MG per tablet  predniSONE (DELTASONE) 50 MG tablet        Allergies  Allergen Reactions  . Celery Oil Hives  . Pork-Derived Products Nausea And Vomiting and Other (See Comments)    headache  . Latex Itching    Powdered latex gloves.     Follow-up Information    Follow up with OSEI-BONSU,GEORGE, MD. Schedule an appointment as soon as possible for a visit in 1 week.   Specialty:  Internal Medicine   Contact information:   [address] [place] [address] [phone]       Follow up with [facility], NEELESH, C, MD. Schedule an appointment as soon as possible for a visit in 2 weeks.   Specialty:  Neurosurgery   Contact information:   [address] [place] [address] [phone]         The results of significant diagnostics from this hospitalization (including imaging, microbiology, ancillary and laboratory) are listed below for reference.    Significant Diagnostic Studies: Dg Chest 2 View  08/24/2014   CLINICAL DATA:  44 year old female with cough, wheezing, shortness of breath and headache for the past 2 weeks accompanied by unintentional weight loss.  EXAM: CHEST  2 VIEW  COMPARISON:  Prior chest x-ray 08/30/2014  FINDINGS: Mild cardiomegaly with left ventricular prominence similar compared to prior. No pulmonary edema, focal airspace consolidation, pleural effusion or pneumothorax. No acute osseous abnormality.  IMPRESSION: Stable borderline cardiomegaly with left ventricular prominence.  Otherwise, no acute cardiopulmonary process.   Electronically Signed   By: Jacqulynn Cadet M.D.   On: 08/24/2014 17:02   Dg Chest 2 View  08/20/2014   CLINICAL DATA:  Diabetes.  History of asthma.  EXAM: CHEST  2 VIEW  COMPARISON:  05/16/2014.  FINDINGS: Mediastinum and hilar structures are normal. Stable mild cardiomegaly with normal pulmonary vascularity. No focal infiltrate. No pleural effusion or pneumothorax. No acute osseous abnormality. Degenerative changes thoracic spine.  IMPRESSION: 1. Stable cardiomegaly . 2. No acute pulmonary disease.   Electronically Signed   By: Marcello Moores  Register   On: 08/20/2014 11:08   Ct Head Wo Contrast  09/04/2014   CLINICAL DATA:  Hypertensive after being placed under general anesthesia. Headache with confusion.  EXAM: CT HEAD WITHOUT CONTRAST  TECHNIQUE: Contiguous axial images were obtained from the base of the skull through the vertex without intravenous contrast.  COMPARISON:  MR brain 06/17/2014.  CT head 12/03/2013.  FINDINGS: No evidence for acute infarction, hemorrhage, mass lesion, hydrocephalus, or extra-axial fluid. Normal cerebral volume. No visible white matter disease. No signs of P RDS. Calvarium intact. No acute sinus or mastoid disease.   IMPRESSION: Negative exam.  No change from normal priors.   Electronically Signed   By: Rolla Flatten M.D.   On: 09/04/2014 11:56   Mr Brain Wo Contrast  09/04/2014   CLINICAL DATA:  Severe hemodynamic reactivity related to anesthesia. Hypertensive after being placed under general anesthesia. Headache with confusion. Initial encounter.  EXAM: MRI HEAD WITHOUT CONTRAST  TECHNIQUE: Multiplanar, multiecho pulse sequences of the brain and surrounding structures were obtained without intravenous contrast.  COMPARISON:  CT head earlier today. Also prior MR 06/17/2014 of the brain.  FINDINGS: No evidence for acute infarction, hemorrhage, mass lesion, hydrocephalus, or extra-axial fluid. Normal cerebral volume. Minor subcortical white matter bilaterally, LEFT greater than RIGHT, nonspecific, but could represent early manifestation of chronic microvascular ischemic change. No features to suggest demyelinating disease, or significant vasculitis. Pituitary, pineal, and cerebellar tonsils  unremarkable. No upper cervical lesions. Flow voids are maintained throughout the carotid, basilar, and vertebral arteries. There are no areas of chronic hemorrhage. Visualized calvarium, skull base, and upper cervical osseous structures unremarkable. Scalp and extracranial soft tissues, orbits, sinuses, and mastoids show no acute process.  Compared with priors, there is no change.  IMPRESSION: No acute intracranial findings.  Minor white matter disease, nonspecific and likely incidental.  No intracranial vascular occlusion or signs of hypertensive encephalopathy.   Electronically Signed   By: Rolla Flatten M.D.   On: 09/04/2014 15:42   Dg Chest Port 1 View  09/05/2014   CLINICAL DATA:  Pulmonary edema  EXAM: PORTABLE CHEST - 1 VIEW  COMPARISON:  09/03/2014  FINDINGS: There is cardiomegaly, unchanged. There is no airspace consolidation or large effusion.  IMPRESSION: Cardiomegaly.   Electronically Signed   By: Andreas Newport M.D.   On:  09/05/2014 06:55   Dg Chest Port 1 View  09/03/2014   CLINICAL DATA:  Asthma exacerbation.  EXAM: PORTABLE CHEST - 1 VIEW  COMPARISON:  Two-view chest 08/24/2014  FINDINGS: The heart is mildly enlarged. Lung volumes have decreased. Mild pulmonary vascular congestion is present without frank edema. There no definite effusions. No focal airspace consolidation is present.  IMPRESSION: 1. Cardiomegaly with mild pulmonary vascular congestion. 2. No focal airspace disease.   Electronically Signed   By: San Morelle M.D.   On: 09/03/2014 09:53   US Abdomen Limited Ruq  09/06/2014   CLINICAL DATA:  Elevated liver enzymes  EXAM: US ABDOMEN LIMITED - RIGHT UPPER QUADRANT  COMPARISON:  01/13/2014  FINDINGS: Gallbladder:  No gallstones or wall thickening visualized. No sonographic Murphy sign noted.  Common bile duct:  Diameter: 4.3 mm in diameter within normal limits.  Liver:  No focal lesion identified. Again noted diffuse increased echogenicity of the liver consistent with fatty infiltration.  IMPRESSION: 1. No gallstones are noted within gallbladder.  Normal CBD. 2. Again noted fatty infiltration of the liver.   Electronically Signed   By: Lahoma Crocker M.D.   On: 09/06/2014 15:57    Microbiology: No results found for this or any previous visit (from the past 240 hour(s)).   Labs: Basic Metabolic Panel:  Recent Labs Lab 09/04/14 0555  09/06/14 0730 09/07/14 0731 09/08/14 0550 09/09/14 0654 09/10/14 0913  NA 135  < > 136 136 136 137 136  K 3.9  < > 4.2 4.3 4.1 4.0 3.9  CL 102  < > 102 100 103 102 100  CO2 28  < > 27 27 25 26 27   GLUCOSE 177*  < > 123* 145* 105* 135* 181*  BUN <5*  < > 7 6 <5* 6 7  CREATININE 1.12*  < > 1.09 1.07 0.96 0.94 1.02  CALCIUM 8.2*  < > 8.8 8.9 9.1 9.1 9.4  MG 1.7  --   --   --   --   --   --   PHOS 3.5  --   --   --   --   --   --   < > = values in this interval not displayed. Liver Function Tests:  Recent Labs Lab 09/05/14 0300 09/07/14 0731  09/08/14 0550 09/09/14 0654  AST 40* 49* 54* 52*  ALT 46* 47* 55* 61*  ALKPHOS 64 59 59 57  BILITOT 0.8 0.6 0.7 0.5  PROT 6.4 6.3 6.4 6.3  ALBUMIN 3.4* 3.2* 3.2* 3.2*   No results for input(s): LIPASE, AMYLASE in the  last 168 hours.  Recent Labs Lab 09/06/14 1228 09/07/14 1026 09/08/14 0530 09/09/14 0854 09/10/14 0913  AMMONIA 48* 74* 75* 52* 41*   CBC:  Recent Labs Lab 09/03/14 2045 09/04/14 0555 09/07/14 0731 09/09/14 0654 09/10/14 0913  WBC 11.4* 7.0 5.1 5.3 5.0  HGB 13.2 12.0 13.4 13.3 13.4  HCT 40.1 37.8 42.0 42.3 42.2  MCV 85.3 88.1 88.4 88.3 88.1  PLT 206 179 189 185 198   Cardiac Enzymes:  Recent Labs Lab 09/05/14 1318  CKTOTAL 95   BNP: BNP (last 3 results)  Recent Labs  08/24/14 2118  BNP 8.8    ProBNP (last 3 results)  Recent Labs  01/13/14 0845 05/17/14 0449  PROBNP 13.3 24.3    CBG:  Recent Labs Lab 09/09/14 1144 09/09/14 1637 09/09/14 2157 09/10/14 0647 09/10/14 1107  GLUCAP 146* 133* 129* 126* 144*       Signed:  Ivyonna Hoelzel MD [facility] 09/10/2014, 3:35 PM

## 2014-09-10 NOTE — Progress Notes (Signed)
Patient discharged to home with instructions, daughter was at bedside.

## 2014-09-20 ENCOUNTER — Ambulatory Visit: Payer: Medicare Other | Admitting: Podiatry

## 2014-09-20 DIAGNOSIS — F332 Major depressive disorder, recurrent severe without psychotic features: Secondary | ICD-10-CM | POA: Diagnosis not present

## 2014-09-21 DIAGNOSIS — I10 Essential (primary) hypertension: Secondary | ICD-10-CM | POA: Diagnosis not present

## 2014-09-21 DIAGNOSIS — K219 Gastro-esophageal reflux disease without esophagitis: Secondary | ICD-10-CM | POA: Diagnosis not present

## 2014-09-21 DIAGNOSIS — E669 Obesity, unspecified: Secondary | ICD-10-CM | POA: Diagnosis not present

## 2014-09-21 DIAGNOSIS — J449 Chronic obstructive pulmonary disease, unspecified: Secondary | ICD-10-CM | POA: Diagnosis not present

## 2014-09-21 DIAGNOSIS — J45909 Unspecified asthma, uncomplicated: Secondary | ICD-10-CM | POA: Diagnosis not present

## 2014-09-21 DIAGNOSIS — N920 Excessive and frequent menstruation with regular cycle: Secondary | ICD-10-CM | POA: Diagnosis not present

## 2014-09-21 DIAGNOSIS — G4733 Obstructive sleep apnea (adult) (pediatric): Secondary | ICD-10-CM | POA: Diagnosis not present

## 2014-09-21 DIAGNOSIS — I119 Hypertensive heart disease without heart failure: Secondary | ICD-10-CM | POA: Diagnosis not present

## 2014-09-22 ENCOUNTER — Encounter (HOSPITAL_COMMUNITY): Payer: Self-pay | Admitting: Neurosurgery

## 2014-09-24 ENCOUNTER — Ambulatory Visit (INDEPENDENT_AMBULATORY_CARE_PROVIDER_SITE_OTHER): Payer: Medicare Other | Admitting: Podiatry

## 2014-09-24 ENCOUNTER — Encounter: Payer: Self-pay | Admitting: Podiatry

## 2014-09-24 VITALS — BP 192/102 | HR 109

## 2014-09-24 DIAGNOSIS — M659 Synovitis and tenosynovitis, unspecified: Secondary | ICD-10-CM

## 2014-09-24 NOTE — Progress Notes (Signed)
Right foot is so sore and the left foot is doing the same.  Discussed possible option, no more procedure or doing another bone graft on right. We have discussed earlier about removing fusion hardware and add bone graft. She has recovered well with hardware removal and now wish to have the bone graft. Procedure reviewed.

## 2014-09-24 NOTE — Patient Instructions (Signed)
Discussed about bone graft procedure on right.

## 2014-09-28 ENCOUNTER — Encounter: Payer: Self-pay | Admitting: Neurology

## 2014-09-28 ENCOUNTER — Ambulatory Visit (INDEPENDENT_AMBULATORY_CARE_PROVIDER_SITE_OTHER): Payer: Medicare Other | Admitting: Neurology

## 2014-09-28 VITALS — BP 148/93 | HR 85 | Temp 97.8°F | Ht 66.0 in | Wt 293.0 lb

## 2014-09-28 DIAGNOSIS — G9520 Unspecified cord compression: Secondary | ICD-10-CM

## 2014-09-28 DIAGNOSIS — F332 Major depressive disorder, recurrent severe without psychotic features: Secondary | ICD-10-CM | POA: Diagnosis not present

## 2014-09-28 NOTE — Patient Instructions (Signed)
Overall you are doing fairly well but I do want to suggest a few things today:   Remember to drink plenty of fluid, eat healthy meals and do not skip any meals. Try to eat protein with a every meal and eat a healthy snack such as fruit or nuts in between meals. Try to keep a regular sleep-wake schedule and try to exercise daily, particularly in the form of walking, 20-30 minutes a day, if you can.   Follow up with Dr. Kathyrn Sheriff  I would like to see you back as needed, sooner if we need to. Please call us with any interim questions, concerns, problems, updates or refill requests.   Please also call us for any test results so we can go over those with you on the phone.  My clinical assistant and will answer any of your questions and relay your messages to me and also relay most of my messages to you.   Our phone number is [phone]. We also have an after hours call service for urgent matters and there is a physician on-call for urgent questions. For any emergencies you know to call 911 or go to the nearest emergency room

## 2014-09-28 NOTE — Progress Notes (Signed)
[facility]    Provider:  Dr Jaynee Eagles Referring Provider: Benito Mccreedy, MD Primary Care Physician:  Benito Mccreedy, MD  Provider: Dr Jaynee Eagles Referring Provider: Benito Mccreedy, MD Primary Care Physician: Benito Mccreedy, MD  CC: Left hand pain  Interval update: 09/28/2014: She still has left hand pain. She was in the hospital for a week, they tried to take her to the OR but anesthesia was cancelled when her BP went above 200. MRi of the cervical cord revealed At C5-6: posterior right para-sagittal disc protrusion (11mm) with severe spinal cord compression; mild STIR hyperintense signal within the cord may represent edema or gliosis. Adjacent thickening and ossification of posterior longitudinal ligament.(personally reviewed and agree with findings). Patient is here today for follow up of MRI findings.   Can't do much for patient, advised her to follow up with Dr. Kathyrn Sheriff. Discussed what happened inpatient. She saw her pcp since. I placed a referral to [facility] at her request however I don't think it was necessary. She says she will call tomorrow. Again discussed precautions and red flags and when to proceed to the ED immediately. Again discussed the severity of findingsa, what happens when cervical cord is compressed, the potential sequelae and possible morbidity and mortality.  Initial visit 06/29/2014: Diana Henderson is a 44 y.o. female here as a follow up for left hand pain. She also has OSA, uncontrolled DM2, HTN, COPD, Morbid obesity, diabetic neuropathy. She has previous CTS release on the left hand. Now with several months of worsening numbness in the digits 1-2 of the left hand. Throbbing and weakness, dropping everything. MRi of the cervical cord revealed At C5-6: posterior right para-sagittal disc protrusion (61mm) with severe spinal cord compression; mild STIR hyperintense signal within the cord may represent edema or gliosis. Adjacent thickening  and ossification of posterior longitudinal ligament.(personally reviewed and agree with findings). Patient is here today for follow up of MRI findings.   Review of Systems: Patient complains of symptoms per HPI as well as the following symptoms: OSA not on cpap (encouraged, increased risk of stroke). Pertinent negatives per HPI. All others negative.   History   Social History  . Marital Status: Single    Spouse Name: N/A  . Number of Children: 1  . Years of Education: 12   Occupational History  . disabled    Social History Main Topics  . Smoking status: Former Smoker -- 2.00 packs/day for 6 years    Types: Cigarettes    Quit date: 06/18/1978  . Smokeless tobacco: Never Used  . Alcohol Use: 0.0 oz/week    0 Standard drinks or equivalent per week     Comment: weekend- use of beer   . Drug Use: No  . Sexual Activity: No   Other Topics Concern  . Not on file   Social History Narrative   Lives with daughter, works at [employer], does not exercise but is on her feet at work.    Family History  Problem Relation Age of Onset  . Heart attack Father   . Kidney disease Mother     Past Medical History  Diagnosis Date  . Diabetes mellitus   . Hypertension   . Hyperlipidemia   . Depression   . PTSD (post-traumatic stress disorder)   . Pain in joint, ankle and foot 02/17/2013  . Anxiety   . Bipolar 1 disorder   . Sleep apnea     uses CPAP intermittently, pt. doesn't remember where she had the study  . Asthma  has been eval. by Dr. Gwenette Greet- last 06/2014  . Pneumonia     hosp. for a couple of day- not sure when it was   . GERD (gastroesophageal reflux disease)   . Headache     sometimes has migraines   . Arthritis     osteoarthritis     Past Surgical History  Procedure Laterality Date  . Tubal ligation    . Midtarsal arthrodesis Right 07/17/12    Rt foot  . Remove implant deep Right 07/17/12    Rt foot  . Lapidus fusion Right 01/10/12    Rt foot  . Carpal tunnel  release Left 2001    Left wrist  . Gastroc recession extremity Right 3//20/14    Right foot  . Hernia repair      1996  . Remove implant deep Right 07/22/2014    Rt foot @ [facility]    Current Outpatient Prescriptions  Medication Sig Dispense Refill  . albuterol (PROVENTIL HFA;VENTOLIN HFA) 108 (90 BASE) MCG/ACT inhaler Inhale 2 puffs into the lungs every 6 (six) hours as needed for wheezing or shortness of breath.    Marland Kitchen albuterol (PROVENTIL) (2.5 MG/3ML) 0.083% nebulizer solution Take 2.5 mg by nebulization every 6 (six) hours as needed for wheezing or shortness of breath.    Marland Kitchen amLODipine (NORVASC) 10 MG tablet Take 1 tablet (10 mg total) by mouth daily. 30 tablet 0  . atenolol (TENORMIN) 50 MG tablet Take 1 tablet (50 mg total) by mouth daily. 30 tablet 0  . budesonide-formoterol (SYMBICORT) 160-4.5 MCG/ACT inhaler Inhale 2 puffs into the lungs 2 (two) times daily.    . DULoxetine (CYMBALTA) 60 MG capsule Take 60 mg by mouth at bedtime.     Marland Kitchen GENERLAC 10 GM/15ML SOLN   0  . HYDROcodone-acetaminophen (NORCO/VICODIN) 5-325 MG per tablet Take 1-2 tablets by mouth every 4 (four) hours as needed for moderate pain. 30 tablet 0  . insulin NPH-regular Human (NOVOLIN 70/30) (70-30) 100 UNIT/ML injection Inject 30 Units into the skin 2 (two) times daily with a meal.    . INVOKANA 300 MG TABS tablet Take 300 mg by mouth daily.    Marland Kitchen JANUMET XR 50-1000 MG TB24 Take 1 tablet by mouth daily.    Marland Kitchen lactulose (CHRONULAC) 10 GM/15ML solution Take 45 mLs (30 g total) by mouth 3 (three) times daily. 2000 mL 0  . lisinopril (PRINIVIL,ZESTRIL) 20 MG tablet Take 1 tablet (20 mg total) by mouth daily. 30 tablet 0  . omeprazole (PRILOSEC) 20 MG capsule Take 20 mg by mouth daily.     . ondansetron (ZOFRAN ODT) 8 MG disintegrating tablet Take 1 tablet (8 mg total) by mouth every 8 (eight) hours as needed for nausea or vomiting. 15 tablet 0  . tiZANidine (ZANAFLEX) 4 MG tablet Take 1 tablet (4 mg total) by mouth every 8  (eight) hours as needed for muscle spasms. 15 tablet 0  . trimethoprim-polymyxin b (POLYTRIM) ophthalmic solution Place 1 drop into the left eye every 4 (four) hours. 10 mL 0   No current facility-administered medications for this visit.    Allergies as of 09/28/2014 - Review Complete 09/28/2014  Allergen Reaction Noted  . Celery oil Hives 11/12/2012  . Pork-derived products Nausea And Vomiting and Other (See Comments) 02/17/2014  . Latex Itching 11/12/2012    Vitals: BP 148/93 mmHg  Pulse 85  Temp(Src) 97.8 F (36.6 C)  Ht 5\' 6"  (1.676 m)  Wt 293 lb (132.904 kg)  BMI  47.31 kg/m2 Last Weight:  Wt Readings from Last 1 Encounters:  09/28/14 293 lb (132.904 kg)   Last Height:   Ht Readings from Last 1 Encounters:  09/28/14 5\' 6"  (1.676 m)         Assessment/Plan:   Reviewed MRI images again with patient and discussed the findings which included cervical cord myelopathy and the significance of this finding, as the compression can cause irreversible cord damage and a variety af symptoms including paralysis. Referring to neurosurgery. If she should deveop any concerning symptoms such as bowel/bladder changes, increased symptoms such as weakness, urinary or bowel changes or anything concerning that she should proceed to the emergency room. Fall pecaution.  A total of 15 minutes was spent in with this patient. Over half this time was spent on counseling patient on the diagnosis and different therapeutic options available.   Sarina Ill, MD  [facility] [address] [place], [place] [address]  Phone [phone] Fax [phone]

## 2014-09-30 DIAGNOSIS — M216X9 Other acquired deformities of unspecified foot: Secondary | ICD-10-CM | POA: Diagnosis not present

## 2014-09-30 DIAGNOSIS — D2121 Benign neoplasm of connective and other soft tissue of right lower limb, including hip: Secondary | ICD-10-CM | POA: Diagnosis not present

## 2014-09-30 HISTORY — PX: OTHER SURGICAL HISTORY: SHX169

## 2014-10-01 DIAGNOSIS — I10 Essential (primary) hypertension: Secondary | ICD-10-CM | POA: Diagnosis not present

## 2014-10-01 DIAGNOSIS — J449 Chronic obstructive pulmonary disease, unspecified: Secondary | ICD-10-CM | POA: Diagnosis not present

## 2014-10-01 DIAGNOSIS — G4733 Obstructive sleep apnea (adult) (pediatric): Secondary | ICD-10-CM | POA: Diagnosis not present

## 2014-10-01 DIAGNOSIS — N920 Excessive and frequent menstruation with regular cycle: Secondary | ICD-10-CM | POA: Diagnosis not present

## 2014-10-01 DIAGNOSIS — J45909 Unspecified asthma, uncomplicated: Secondary | ICD-10-CM | POA: Diagnosis not present

## 2014-10-01 DIAGNOSIS — K219 Gastro-esophageal reflux disease without esophagitis: Secondary | ICD-10-CM | POA: Diagnosis not present

## 2014-10-01 DIAGNOSIS — I119 Hypertensive heart disease without heart failure: Secondary | ICD-10-CM | POA: Diagnosis not present

## 2014-10-01 DIAGNOSIS — M19079 Primary osteoarthritis, unspecified ankle and foot: Secondary | ICD-10-CM | POA: Diagnosis not present

## 2014-10-04 ENCOUNTER — Ambulatory Visit (INDEPENDENT_AMBULATORY_CARE_PROVIDER_SITE_OTHER): Payer: Medicare Other | Admitting: Podiatry

## 2014-10-04 ENCOUNTER — Encounter: Payer: Self-pay | Admitting: Podiatry

## 2014-10-04 VITALS — BP 148/97 | HR 117

## 2014-10-04 DIAGNOSIS — Z9889 Other specified postprocedural states: Secondary | ICD-10-CM

## 2014-10-04 NOTE — Progress Notes (Signed)
44 year old presents for 1 week post op right foot, Cotton osteotomy with bone graft. Walking in cast and walker. Minimum discomfort. Denies fever or chills. No edema noted on digits. Able to wiggle all toes.  Reviewed intra operative findings, wire suture removal, neuroma removal and bone graft. Return in 3 weeks.

## 2014-10-04 NOTE — Patient Instructions (Signed)
One week post op check. Doing well with cast and using walker. Continue with minimum ambulation. Return in 3 weeks.

## 2014-10-05 ENCOUNTER — Encounter: Payer: Medicare Other | Admitting: Podiatry

## 2014-10-08 DIAGNOSIS — K219 Gastro-esophageal reflux disease without esophagitis: Secondary | ICD-10-CM | POA: Diagnosis not present

## 2014-10-08 DIAGNOSIS — Z01818 Encounter for other preprocedural examination: Secondary | ICD-10-CM | POA: Diagnosis not present

## 2014-10-08 DIAGNOSIS — J449 Chronic obstructive pulmonary disease, unspecified: Secondary | ICD-10-CM | POA: Diagnosis not present

## 2014-10-08 DIAGNOSIS — M19079 Primary osteoarthritis, unspecified ankle and foot: Secondary | ICD-10-CM | POA: Diagnosis not present

## 2014-10-08 DIAGNOSIS — I10 Essential (primary) hypertension: Secondary | ICD-10-CM | POA: Diagnosis not present

## 2014-10-08 DIAGNOSIS — G4733 Obstructive sleep apnea (adult) (pediatric): Secondary | ICD-10-CM | POA: Diagnosis not present

## 2014-10-08 DIAGNOSIS — J45909 Unspecified asthma, uncomplicated: Secondary | ICD-10-CM | POA: Diagnosis not present

## 2014-10-08 DIAGNOSIS — I119 Hypertensive heart disease without heart failure: Secondary | ICD-10-CM | POA: Diagnosis not present

## 2014-10-11 ENCOUNTER — Other Ambulatory Visit (HOSPITAL_COMMUNITY): Payer: Self-pay | Admitting: Neurosurgery

## 2014-10-14 ENCOUNTER — Encounter: Payer: Self-pay | Admitting: *Deleted

## 2014-10-25 ENCOUNTER — Ambulatory Visit (INDEPENDENT_AMBULATORY_CARE_PROVIDER_SITE_OTHER): Payer: Medicare Other | Admitting: Podiatry

## 2014-10-25 ENCOUNTER — Encounter: Payer: Self-pay | Admitting: Podiatry

## 2014-10-25 DIAGNOSIS — Z9889 Other specified postprocedural states: Secondary | ICD-10-CM

## 2014-10-25 DIAGNOSIS — T819XXA Unspecified complication of procedure, initial encounter: Secondary | ICD-10-CM | POA: Insufficient documentation

## 2014-10-25 DIAGNOSIS — M25571 Pain in right ankle and joints of right foot: Secondary | ICD-10-CM

## 2014-10-25 DIAGNOSIS — S92201P Fracture of unspecified tarsal bone(s) of right foot, subsequent encounter for fracture with malunion: Secondary | ICD-10-CM | POA: Diagnosis not present

## 2014-10-25 NOTE — Patient Instructions (Signed)
3 weeks following cotton osteotomy with bone graft. X-ray show dorsally displaced bone graft.  Need be corrected with staple insertion. Will schedule for this Thursday.

## 2014-10-25 NOTE — Progress Notes (Signed)
3 weeks post op right foot since Cotton osteotomy with bone graft. Cast removed. Post op X-rays done. Noted of dorsally displaced bone graft. Reviewed the findings with patient. Made plan to go back to surgery to correct the problem by inserting staple. Consent form reviewed.

## 2014-10-26 NOTE — Progress Notes (Addendum)
Anesthesia Chart Review:  Pt is 44 year old female scheduled for C5-6 ACDF on 11/05/2014 with Dr. Kathyrn Sheriff.   Pt was originally scheduled for this surgery on 09/03/2014.  See Dr. Zettie Pho anesthesia note from that day. After induction, pt developed severe hypertension [SBP 240-260/DBP 120-140] that was refractory to intervention. Case cancelled and pt admitted for evaluation. Drug screen negative except for benzodiazepines. CK, CKMB and troponin negative. TSH and T4 normal. Urine metanephrines from 24 hour urine normal. EEG normal. Hospitalization complicated by acute metabolic encephalopathy/hypertensive encephalopathy. Hospitalization complicated by hyperammonemia thought to be a result of fatty liver disease. No cause of malignant hypertension identified.   1 view chest x-ray 09/05/2014 reviewed. Cardiomegaly, unchanged.   EKG 09/03/2014: sinus tachycardia (126 bpm). This was obtained during pt's hypertensive and tachycardiac episode during anesthesia induction.   Echo 09/06/2014: - Left ventricle: The cavity size was normal. Wall thickness was increased in a pattern of mild LVH. Systolic function was normal. The estimated ejection fraction was in the range of 50% to 55%. Wall motion was normal; there were no regional wall motion abnormalities. Doppler parameters are consistent with abnormal left ventricular relaxation (grade 1 diastolic dysfunction).  Willeen Cass, FNP-BC [facility]/Anesthesiology Phone: [phone] 10/26/2014 4:12 PM   Addendum:   Meds include albuterol, amlodipine 10 mg Q AM, atenolol 50 mg Q AM, Symbicort, Cymbalta, Norco, Novolin 70/30, Invokana, Janumet, lactulose PRN, lisinopril 20 mg Q PM, omeprazole, Zanaflex.  Patient's BP at PAT was 143/94. She had taken her lisinopril the evening prior but had not yet taken her morning amlodipine and atenolol.  She was instructed to take these on the morning of surgery and continue her lisinopril in the evening  as she has already been doing.    Preoperative labs noted.  AST/ALT have normalized. Glucose 207. CBC WNL. Serum pregnancy was negative.  She is planning to see pulmonologist Dr. Gwenette Greet for follow-up on 11/03/14.    I have updated anesthesiologists Dr. Marcie Bal and Dr. Ermalene Postin regarding her past anesthesia history.  She will be evaluated further on the day of surgery.  She was instructed to stay compliant with her anti-hypertensive regimen.    George Hugh [facility] Stay Center/Anesthesiology Phone [phone] 10/28/2014 2:46 PM

## 2014-10-27 ENCOUNTER — Encounter (HOSPITAL_COMMUNITY): Payer: Self-pay

## 2014-10-27 ENCOUNTER — Encounter (HOSPITAL_COMMUNITY)
Admission: RE | Admit: 2014-10-27 | Discharge: 2014-10-27 | Disposition: A | Discharge status: Home / Self Care | Payer: Medicare Other | Source: Ambulatory Visit | Attending: Neurosurgery | Admitting: Neurosurgery

## 2014-10-27 DIAGNOSIS — M5022 Other cervical disc displacement, mid-cervical region: Secondary | ICD-10-CM | POA: Diagnosis not present

## 2014-10-27 DIAGNOSIS — Z79899 Other long term (current) drug therapy: Secondary | ICD-10-CM | POA: Diagnosis not present

## 2014-10-27 DIAGNOSIS — Z981 Arthrodesis status: Secondary | ICD-10-CM | POA: Diagnosis not present

## 2014-10-27 DIAGNOSIS — F329 Major depressive disorder, single episode, unspecified: Secondary | ICD-10-CM | POA: Diagnosis present

## 2014-10-27 DIAGNOSIS — M5002 Cervical disc disorder with myelopathy, mid-cervical region: Secondary | ICD-10-CM | POA: Diagnosis not present

## 2014-10-27 DIAGNOSIS — Z87891 Personal history of nicotine dependence: Secondary | ICD-10-CM | POA: Diagnosis not present

## 2014-10-27 DIAGNOSIS — E119 Type 2 diabetes mellitus without complications: Secondary | ICD-10-CM | POA: Diagnosis not present

## 2014-10-27 DIAGNOSIS — J45909 Unspecified asthma, uncomplicated: Secondary | ICD-10-CM | POA: Diagnosis present

## 2014-10-27 DIAGNOSIS — G8929 Other chronic pain: Secondary | ICD-10-CM | POA: Diagnosis not present

## 2014-10-27 DIAGNOSIS — M1990 Unspecified osteoarthritis, unspecified site: Secondary | ICD-10-CM | POA: Diagnosis not present

## 2014-10-27 DIAGNOSIS — G4730 Sleep apnea, unspecified: Secondary | ICD-10-CM | POA: Diagnosis present

## 2014-10-27 DIAGNOSIS — Z794 Long term (current) use of insulin: Secondary | ICD-10-CM | POA: Diagnosis not present

## 2014-10-27 DIAGNOSIS — F4310 Post-traumatic stress disorder, unspecified: Secondary | ICD-10-CM | POA: Diagnosis present

## 2014-10-27 DIAGNOSIS — R200 Anesthesia of skin: Secondary | ICD-10-CM | POA: Diagnosis not present

## 2014-10-27 DIAGNOSIS — E785 Hyperlipidemia, unspecified: Secondary | ICD-10-CM | POA: Diagnosis present

## 2014-10-27 DIAGNOSIS — M545 Low back pain: Secondary | ICD-10-CM | POA: Diagnosis present

## 2014-10-27 DIAGNOSIS — F419 Anxiety disorder, unspecified: Secondary | ICD-10-CM | POA: Diagnosis present

## 2014-10-27 DIAGNOSIS — M4712 Other spondylosis with myelopathy, cervical region: Secondary | ICD-10-CM | POA: Diagnosis not present

## 2014-10-27 DIAGNOSIS — Z4789 Encounter for other orthopedic aftercare: Secondary | ICD-10-CM | POA: Diagnosis not present

## 2014-10-27 DIAGNOSIS — F319 Bipolar disorder, unspecified: Secondary | ICD-10-CM | POA: Diagnosis present

## 2014-10-27 DIAGNOSIS — I10 Essential (primary) hypertension: Secondary | ICD-10-CM | POA: Diagnosis not present

## 2014-10-27 DIAGNOSIS — K219 Gastro-esophageal reflux disease without esophagitis: Secondary | ICD-10-CM | POA: Diagnosis not present

## 2014-10-27 LAB — CBC
HCT: 43.3 % (ref 36.0–46.0)
HEMOGLOBIN: 13.8 g/dL (ref 12.0–15.0)
MCH: 27.2 pg (ref 26.0–34.0)
MCHC: 31.9 g/dL (ref 30.0–36.0)
MCV: 85.2 fL (ref 78.0–100.0)
Platelets: 190 10*3/uL (ref 150–400)
RBC: 5.08 MIL/uL (ref 3.87–5.11)
RDW: 13.4 % (ref 11.5–15.5)
WBC: 5.4 10*3/uL (ref 4.0–10.5)

## 2014-10-27 LAB — COMPREHENSIVE METABOLIC PANEL
ALK PHOS: 80 U/L (ref 38–126)
ALT: 21 U/L (ref 14–54)
AST: 23 U/L (ref 15–41)
Albumin: 3.6 g/dL (ref 3.5–5.0)
Anion gap: 11 (ref 5–15)
BUN: 8 mg/dL (ref 6–20)
CHLORIDE: 101 mmol/L (ref 101–111)
CO2: 26 mmol/L (ref 22–32)
Calcium: 9.3 mg/dL (ref 8.9–10.3)
Creatinine, Ser: 0.98 mg/dL (ref 0.44–1.00)
GFR calc non Af Amer: >60 mL/min (ref >60)
Glucose, Bld: 207 mg/dL — ABNORMAL HIGH (ref 70–99)
Potassium: 4.3 mmol/L (ref 3.5–5.1)
SODIUM: 138 mmol/L (ref 135–145)
TOTAL PROTEIN: 7.5 g/dL (ref 6.5–8.1)
Total Bilirubin: 0.5 mg/dL (ref 0.3–1.2)

## 2014-10-27 LAB — SURGICAL PCR SCREEN
MRSA, PCR: NEGATIVE
STAPHYLOCOCCUS AUREUS: NEGATIVE

## 2014-10-27 LAB — HCG, SERUM, QUALITATIVE: PREG SERUM: NEGATIVE

## 2014-10-27 NOTE — Pre-Procedure Instructions (Addendum)
Diana Henderson  10/27/2014   Your procedure is scheduled on:  Friday, Nov 05, 2014  Report to [facility] Admitting at 5:30 AM.  Call this number if you have problems the morning of surgery: [phone]   Remember:   Do not eat food or drink liquids after midnight Thursday, Nov 04, 2014   Take these medicines the morning of surgery with A SIP OF WATER: amLODipine (NORVASC) ,  atenolol (TENORMIN),  omeprazole (PRILOSEC), budesonide-formoterol (SYMBICORT) inhaler,   if needed: HYDROcodone-acetaminophen (NORCO/VICODIN) for pain, ondansetron (ZOFRAN ODT) for nausea or vomiting, eye drops, albuterol (PROVENTIL) Nebulizer for wheezing or shortness of breath,  albuterol (PROVENTIL HFA;VENTOLIN HFA)  Inhaler for wheezing or shortness of breath ( bring inhaler in with you on day of procedure)   DO NOT take any diabetic medications the morning of procedure such as insulin, INVOKANA, JANUMET.  Stop taking Aspirin, vitamins and herbal medications. Do not take any NSAIDs ie: Ibuprofen, Advil, Naproxen or any medication containing Aspirin; stop 1 week prior to procedure ( Friday, Oct 29, 2014)  Do not wear jewelry, make-up or nail polish.  Do not wear lotions, powders, or perfumes. You may not wear deodorant.  Do not shave 48 hours prior to surgery.  Do not bring valuables to the hospital.  [facility] is not responsible for any belongings or valuables.               Contacts, dentures or bridgework may not be worn into surgery.  Leave suitcase in the car. After surgery it may be brought to your room.  For patients admitted to the hospital, discharge time is determined by your treatment team.               Patients discharged the day of surgery will not be allowed to drive home.  Name and phone number of your driver:   Special Instructions:  Special Instructions:Special Instructions: [facility] - Preparing for Surgery  Before surgery, you can play an important role.  Because skin is  not sterile, your skin needs to be as free of germs as possible.  You can reduce the number of germs on you skin by washing with CHG (chlorahexidine gluconate) soap before surgery.  CHG is an antiseptic cleaner which kills germs and bonds with the skin to continue killing germs even after washing.  Please DO NOT use if you have an allergy to CHG or antibacterial soaps.  If your skin becomes reddened/irritated stop using the CHG and inform your nurse when you arrive at Short Stay.  Do not shave (including legs and underarms) for at least 48 hours prior to the first CHG shower.  You may shave your face.  Please follow these instructions carefully:   1.  Shower with CHG Soap the night before surgery and the morning of Surgery.  2.  If you choose to wash your hair, wash your hair first as usual with your normal shampoo.  3.  After you shampoo, rinse your hair and body thoroughly to remove the Shampoo.  4.  Use CHG as you would any other liquid soap.  You can apply chg directly  to the skin and wash gently with scrungie or a clean washcloth.  5.  Apply the CHG Soap to your body ONLY FROM THE NECK DOWN.  Do not use on open wounds or open sores.  Avoid contact with your eyes, ears, mouth and genitals (private parts).  Wash genitals (private parts) with your normal soap.  6.  Wash thoroughly, paying special attention to the area where your surgery will be performed.  7.  Thoroughly rinse your body with warm water from the neck down.  8.  DO NOT shower/wash with your normal soap after using and rinsing off the CHG Soap.  9.  Pat yourself dry with a clean towel.            10.  Wear clean pajamas.            11.  Place clean sheets on your bed the night of your first shower and do not sleep with pets.  Day of Surgery  Do not apply any lotions/deodorants the morning of surgery.  Please wear clean clothes to the hospital/surgery center.   Please read over the following fact sheets that you were given:  Pain Booklet, Coughing and Deep Breathing, MRSA Information and Surgical Site Infection Prevention

## 2014-10-28 DIAGNOSIS — T8484XA Pain due to internal orthopedic prosthetic devices, implants and grafts, initial encounter: Secondary | ICD-10-CM | POA: Diagnosis not present

## 2014-10-28 DIAGNOSIS — T859XXA Unspecified complication of internal prosthetic device, implant and graft, initial encounter: Secondary | ICD-10-CM | POA: Diagnosis not present

## 2014-11-01 ENCOUNTER — Encounter: Payer: Medicare Other | Admitting: Podiatry

## 2014-11-02 ENCOUNTER — Encounter: Payer: Medicare Other | Admitting: Podiatry

## 2014-11-03 ENCOUNTER — Ambulatory Visit (INDEPENDENT_AMBULATORY_CARE_PROVIDER_SITE_OTHER): Payer: Medicare Other | Admitting: Pulmonary Disease

## 2014-11-03 ENCOUNTER — Encounter: Payer: Self-pay | Admitting: Pulmonary Disease

## 2014-11-03 VITALS — BP 136/80 | HR 98 | Temp 97.0°F | Ht 66.0 in | Wt 303.0 lb

## 2014-11-03 DIAGNOSIS — R05 Cough: Secondary | ICD-10-CM

## 2014-11-03 DIAGNOSIS — R059 Cough, unspecified: Secondary | ICD-10-CM

## 2014-11-03 DIAGNOSIS — R0609 Other forms of dyspnea: Secondary | ICD-10-CM

## 2014-11-03 NOTE — Assessment & Plan Note (Signed)
The patient feels that she is doing well from a breathing standpoint, and she remains on Symbicort for her probable asthma. However, I suspect that her morbid obesity and deconditioning contribute more to her dyspnea on exertion than anything else. I have encouraged her to work aggressively on weight loss.

## 2014-11-03 NOTE — Patient Instructions (Signed)
Stay on symbicort for your asthma twice a day Stay on your reflux medication. followup with Dr. Chase Caller in one year if doing well.

## 2014-11-03 NOTE — Progress Notes (Signed)
   Subjective:    Patient ID: Diana Henderson, female    DOB: 1971-02-14, 44 y.o.   MRN: [id number]  HPI The patient comes in today for follow-up of her cough, dyspnea on exertion, and probable asthma. She has maintained on Symbicort, and feels that she has done well on this medication. She tells me that her cough has totally resolved since being on something for reflux and off her ACE inhibitor. Her dyspnea on exertion is at baseline.   Review of Systems  Constitutional: Negative for fever and unexpected weight change.  HENT: Negative for congestion, dental problem, ear pain, nosebleeds, postnasal drip, rhinorrhea, sinus pressure, sneezing, sore throat and trouble swallowing.   Eyes: Negative for redness and itching.  Respiratory: Negative for cough, chest tightness, shortness of breath and wheezing.   Cardiovascular: Negative for palpitations and leg swelling.  Gastrointestinal: Negative for nausea and vomiting.  Genitourinary: Negative for dysuria.  Musculoskeletal: Negative for joint swelling.  Skin: Negative for rash.  Neurological: Negative for headaches.  Hematological: Does not bruise/bleed easily.  Psychiatric/Behavioral: Negative for dysphoric mood. The patient is not nervous/anxious.        Objective:   Physical Exam Morbidly obese female in no acute distress Nose without purulence or discharge noted Neck without lymphadenopathy or thyromegaly Chest with totally clear breath sounds, no active wheezing Cardiac exam with regular rate and rhythm Lower extremities with mild edema, cast on right foot. Alert and oriented, moves all 4 extremities.       Assessment & Plan:

## 2014-11-03 NOTE — Assessment & Plan Note (Signed)
The patient's cough has totally resolved since being off her ACE inhibitor and on something for reflux.

## 2014-11-04 MED ORDER — DEXTROSE 5 % IV SOLN
3.0000 g | INTRAVENOUS | Status: AC
  Administered 2014-11-05: 3 g via INTRAVENOUS
  Filled 2014-11-04: qty 3000

## 2014-11-05 ENCOUNTER — Encounter (HOSPITAL_COMMUNITY)
Admission: RE | Disposition: A | Discharge status: Home / Self Care | Payer: Self-pay | Source: Ambulatory Visit | Attending: Neurosurgery

## 2014-11-05 ENCOUNTER — Inpatient Hospital Stay (HOSPITAL_COMMUNITY)
Admission: RE | Admit: 2014-11-05 | Discharge: 2014-11-05 | DRG: 473 | Disposition: A | Discharge status: Home / Self Care | Payer: Medicare Other | Source: Ambulatory Visit | Attending: Neurosurgery | Admitting: Neurosurgery

## 2014-11-05 ENCOUNTER — Inpatient Hospital Stay (HOSPITAL_COMMUNITY): Payer: Medicare Other | Admitting: Vascular Surgery

## 2014-11-05 ENCOUNTER — Encounter: Payer: Medicare Other | Admitting: Podiatry

## 2014-11-05 ENCOUNTER — Encounter (HOSPITAL_COMMUNITY): Payer: Self-pay | Admitting: Anesthesiology

## 2014-11-05 ENCOUNTER — Inpatient Hospital Stay (HOSPITAL_COMMUNITY): Payer: Medicare Other

## 2014-11-05 DIAGNOSIS — K219 Gastro-esophageal reflux disease without esophagitis: Secondary | ICD-10-CM | POA: Diagnosis present

## 2014-11-05 DIAGNOSIS — M545 Low back pain: Secondary | ICD-10-CM | POA: Diagnosis present

## 2014-11-05 DIAGNOSIS — J45909 Unspecified asthma, uncomplicated: Secondary | ICD-10-CM | POA: Diagnosis present

## 2014-11-05 DIAGNOSIS — G4730 Sleep apnea, unspecified: Secondary | ICD-10-CM | POA: Diagnosis present

## 2014-11-05 DIAGNOSIS — F4310 Post-traumatic stress disorder, unspecified: Secondary | ICD-10-CM | POA: Diagnosis present

## 2014-11-05 DIAGNOSIS — F319 Bipolar disorder, unspecified: Secondary | ICD-10-CM | POA: Diagnosis present

## 2014-11-05 DIAGNOSIS — Z87891 Personal history of nicotine dependence: Secondary | ICD-10-CM

## 2014-11-05 DIAGNOSIS — M4712 Other spondylosis with myelopathy, cervical region: Secondary | ICD-10-CM | POA: Diagnosis not present

## 2014-11-05 DIAGNOSIS — E119 Type 2 diabetes mellitus without complications: Secondary | ICD-10-CM | POA: Diagnosis not present

## 2014-11-05 DIAGNOSIS — Z79899 Other long term (current) drug therapy: Secondary | ICD-10-CM

## 2014-11-05 DIAGNOSIS — F419 Anxiety disorder, unspecified: Secondary | ICD-10-CM | POA: Diagnosis present

## 2014-11-05 DIAGNOSIS — F329 Major depressive disorder, single episode, unspecified: Secondary | ICD-10-CM | POA: Diagnosis present

## 2014-11-05 DIAGNOSIS — M5022 Other cervical disc displacement, mid-cervical region: Secondary | ICD-10-CM | POA: Diagnosis not present

## 2014-11-05 DIAGNOSIS — I10 Essential (primary) hypertension: Secondary | ICD-10-CM | POA: Diagnosis not present

## 2014-11-05 DIAGNOSIS — M1990 Unspecified osteoarthritis, unspecified site: Secondary | ICD-10-CM | POA: Diagnosis not present

## 2014-11-05 DIAGNOSIS — E785 Hyperlipidemia, unspecified: Secondary | ICD-10-CM | POA: Diagnosis present

## 2014-11-05 DIAGNOSIS — M5002 Cervical disc disorder with myelopathy, mid-cervical region: Secondary | ICD-10-CM | POA: Diagnosis not present

## 2014-11-05 DIAGNOSIS — G8929 Other chronic pain: Secondary | ICD-10-CM | POA: Diagnosis not present

## 2014-11-05 DIAGNOSIS — Z794 Long term (current) use of insulin: Secondary | ICD-10-CM | POA: Diagnosis not present

## 2014-11-05 DIAGNOSIS — Z981 Arthrodesis status: Secondary | ICD-10-CM | POA: Diagnosis not present

## 2014-11-05 DIAGNOSIS — Z4789 Encounter for other orthopedic aftercare: Secondary | ICD-10-CM | POA: Diagnosis not present

## 2014-11-05 DIAGNOSIS — R200 Anesthesia of skin: Secondary | ICD-10-CM | POA: Diagnosis present

## 2014-11-05 DIAGNOSIS — Z419 Encounter for procedure for purposes other than remedying health state, unspecified: Secondary | ICD-10-CM

## 2014-11-05 HISTORY — PX: ANTERIOR CERVICAL DECOMP/DISCECTOMY FUSION: SHX1161

## 2014-11-05 LAB — CBC
HEMATOCRIT: 39.1 % (ref 36.0–46.0)
Hemoglobin: 12.6 g/dL (ref 12.0–15.0)
MCH: 27.5 pg (ref 26.0–34.0)
MCHC: 32.2 g/dL (ref 30.0–36.0)
MCV: 85.4 fL (ref 78.0–100.0)
Platelets: 185 10*3/uL (ref 150–400)
RBC: 4.58 MIL/uL (ref 3.87–5.11)
RDW: 13.4 % (ref 11.5–15.5)
WBC: 8.7 10*3/uL (ref 4.0–10.5)

## 2014-11-05 LAB — GLUCOSE, CAPILLARY
GLUCOSE-CAPILLARY: 249 mg/dL — AB (ref 65–99)
GLUCOSE-CAPILLARY: 234 mg/dL — AB (ref 65–99)
Glucose-Capillary: 239 mg/dL — ABNORMAL HIGH (ref 65–99)
Glucose-Capillary: 187 mg/dL — ABNORMAL HIGH (ref 65–99)

## 2014-11-05 LAB — CREATININE, SERUM
Creatinine, Ser: 1.13 mg/dL — ABNORMAL HIGH (ref 0.44–1.00)
GFR calc Af Amer: >60 mL/min (ref >60)
GFR calc non Af Amer: 57 mL/min — ABNORMAL LOW (ref >60)

## 2014-11-05 SURGERY — ANTERIOR CERVICAL DECOMPRESSION/DISCECTOMY FUSION 1 LEVEL
Anesthesia: General | Site: Neck

## 2014-11-05 MED ORDER — ACETAMINOPHEN 10 MG/ML IV SOLN
INTRAVENOUS | Status: AC
  Administered 2014-11-05: 1000 mg via INTRAVENOUS
  Filled 2014-11-05: qty 100

## 2014-11-05 MED ORDER — LACTATED RINGERS IV SOLN
INTRAVENOUS | Status: DC | PRN
  Administered 2014-11-05: 07:00:00 via INTRAVENOUS

## 2014-11-05 MED ORDER — SUCCINYLCHOLINE CHLORIDE 20 MG/ML IJ SOLN
INTRAMUSCULAR | Status: DC | PRN
  Administered 2014-11-05: 120 mg via INTRAVENOUS

## 2014-11-05 MED ORDER — LIDOCAINE HCL (CARDIAC) 20 MG/ML IV SOLN
INTRAVENOUS | Status: DC | PRN
  Administered 2014-11-05: 100 mg via INTRAVENOUS

## 2014-11-05 MED ORDER — ALBUTEROL SULFATE HFA 108 (90 BASE) MCG/ACT IN AERS: 2.0000 | INHALATION_SPRAY | Freq: Four times a day (QID) | RESPIRATORY_TRACT | Status: DC | PRN

## 2014-11-05 MED ORDER — SODIUM CHLORIDE 0.9 % IV SOLN: INTRAVENOUS | Status: DC

## 2014-11-05 MED ORDER — MENTHOL 3 MG MT LOZG: 1.0000 | LOZENGE | OROMUCOSAL | Status: DC | PRN

## 2014-11-05 MED ORDER — ROCURONIUM BROMIDE 50 MG/5ML IV SOLN
INTRAVENOUS | Status: AC
  Filled 2014-11-05: qty 1

## 2014-11-05 MED ORDER — THROMBIN 5000 UNITS EX SOLR
CUTANEOUS | Status: DC | PRN
  Administered 2014-11-05 (×2): 5000 [IU] via TOPICAL

## 2014-11-05 MED ORDER — MORPHINE SULFATE 2 MG/ML IJ SOLN: 1.0000 mg | INTRAMUSCULAR | Status: DC | PRN

## 2014-11-05 MED ORDER — GLYCOPYRROLATE 0.2 MG/ML IJ SOLN
INTRAMUSCULAR | Status: AC
  Filled 2014-11-05: qty 2

## 2014-11-05 MED ORDER — EPHEDRINE SULFATE 50 MG/ML IJ SOLN
INTRAMUSCULAR | Status: DC | PRN
  Administered 2014-11-05: 10 mg via INTRAVENOUS

## 2014-11-05 MED ORDER — PANTOPRAZOLE SODIUM 40 MG PO TBEC
40.0000 mg | DELAYED_RELEASE_TABLET | Freq: Every day | ORAL | Status: DC
  Administered 2014-11-05: 40 mg via ORAL
  Filled 2014-11-05: qty 1

## 2014-11-05 MED ORDER — ATENOLOL 50 MG PO TABS: 50.0000 mg | ORAL_TABLET | Freq: Every day | ORAL | Status: DC

## 2014-11-05 MED ORDER — SODIUM CHLORIDE 0.9 % IJ SOLN: 3.0000 mL | INTRAMUSCULAR | Status: DC | PRN

## 2014-11-05 MED ORDER — CANAGLIFLOZIN 300 MG PO TABS: 300.0000 mg | ORAL_TABLET | Freq: Every day | ORAL | Status: DC

## 2014-11-05 MED ORDER — CEFAZOLIN SODIUM-DEXTROSE 2-3 GM-% IV SOLR
2.0000 g | Freq: Three times a day (TID) | INTRAVENOUS | Status: DC
  Administered 2014-11-05: 2 g via INTRAVENOUS
  Filled 2014-11-05 (×2): qty 50

## 2014-11-05 MED ORDER — PHENYLEPHRINE 40 MCG/ML (10ML) SYRINGE FOR IV PUSH (FOR BLOOD PRESSURE SUPPORT)
PREFILLED_SYRINGE | INTRAVENOUS | Status: AC
  Filled 2014-11-05: qty 10

## 2014-11-05 MED ORDER — SODIUM CHLORIDE 0.9 % IV SOLN
1000.0000 ug | INTRAVENOUS | Status: DC | PRN
  Administered 2014-11-05: .5 ug/kg/min via INTRAVENOUS

## 2014-11-05 MED ORDER — OXYCODONE HCL 5 MG PO TABS: 5.0000 mg | ORAL_TABLET | Freq: Once | ORAL | Status: DC | PRN

## 2014-11-05 MED ORDER — BUPIVACAINE HCL 0.5 % IJ SOLN
INTRAMUSCULAR | Status: DC | PRN
  Administered 2014-11-05: 8 mL

## 2014-11-05 MED ORDER — DULOXETINE HCL 60 MG PO CPEP
60.0000 mg | ORAL_CAPSULE | Freq: Every day | ORAL | Status: DC
  Filled 2014-11-05: qty 1

## 2014-11-05 MED ORDER — LIDOCAINE HCL (CARDIAC) 20 MG/ML IV SOLN
INTRAVENOUS | Status: AC
  Filled 2014-11-05: qty 5

## 2014-11-05 MED ORDER — HYDROMORPHONE HCL 1 MG/ML IJ SOLN
0.2500 mg | INTRAMUSCULAR | Status: DC | PRN
  Administered 2014-11-05 (×3): 0.5 mg via INTRAVENOUS

## 2014-11-05 MED ORDER — SENNOSIDES-DOCUSATE SODIUM 8.6-50 MG PO TABS
1.0000 | ORAL_TABLET | Freq: Every evening | ORAL | Status: DC | PRN
  Filled 2014-11-05: qty 1

## 2014-11-05 MED ORDER — GLYCOPYRROLATE 0.2 MG/ML IJ SOLN
INTRAMUSCULAR | Status: DC | PRN
  Administered 2014-11-05: 0.4 mg via INTRAVENOUS

## 2014-11-05 MED ORDER — DIAZEPAM 5 MG PO TABS: 5.0000 mg | ORAL_TABLET | Freq: Four times a day (QID) | ORAL | Status: DC | PRN

## 2014-11-05 MED ORDER — SODIUM CHLORIDE 0.9 % IR SOLN
Status: DC | PRN
  Administered 2014-11-05: 09:00:00

## 2014-11-05 MED ORDER — SUCCINYLCHOLINE CHLORIDE 20 MG/ML IJ SOLN
INTRAMUSCULAR | Status: AC
  Filled 2014-11-05: qty 1

## 2014-11-05 MED ORDER — PROPOFOL 10 MG/ML IV BOLUS
INTRAVENOUS | Status: AC
  Filled 2014-11-05: qty 20

## 2014-11-05 MED ORDER — ONDANSETRON HCL 4 MG/2ML IJ SOLN
4.0000 mg | INTRAMUSCULAR | Status: DC | PRN
  Filled 2014-11-05: qty 2

## 2014-11-05 MED ORDER — HEMOSTATIC AGENTS (NO CHARGE) OPTIME
TOPICAL | Status: DC | PRN
  Administered 2014-11-05: 1 via TOPICAL

## 2014-11-05 MED ORDER — FENTANYL CITRATE (PF) 250 MCG/5ML IJ SOLN
INTRAMUSCULAR | Status: AC
  Filled 2014-11-05: qty 5

## 2014-11-05 MED ORDER — DEXTROSE 5 % IV SOLN
10.0000 mg | INTRAVENOUS | Status: DC | PRN
  Administered 2014-11-05: 50 ug/min via INTRAVENOUS

## 2014-11-05 MED ORDER — POLYMYXIN B-TRIMETHOPRIM 10000-0.1 UNIT/ML-% OP SOLN: 1.0000 [drp] | OPHTHALMIC | Status: DC

## 2014-11-05 MED ORDER — ROCURONIUM BROMIDE 100 MG/10ML IV SOLN
INTRAVENOUS | Status: DC | PRN
  Administered 2014-11-05: 50 mg via INTRAVENOUS

## 2014-11-05 MED ORDER — EPHEDRINE SULFATE 50 MG/ML IJ SOLN
INTRAMUSCULAR | Status: AC
  Filled 2014-11-05: qty 1

## 2014-11-05 MED ORDER — KETOROLAC TROMETHAMINE 30 MG/ML IJ SOLN: 30.0000 mg | Freq: Once | INTRAMUSCULAR | Status: DC | PRN

## 2014-11-05 MED ORDER — AMLODIPINE BESYLATE 10 MG PO TABS: 10.0000 mg | ORAL_TABLET | Freq: Every day | ORAL | Status: DC

## 2014-11-05 MED ORDER — ONDANSETRON 8 MG PO TBDP
8.0000 mg | ORAL_TABLET | Freq: Three times a day (TID) | ORAL | Status: DC | PRN
  Filled 2014-11-05: qty 1

## 2014-11-05 MED ORDER — HYDROMORPHONE HCL 1 MG/ML IJ SOLN
INTRAMUSCULAR | Status: AC
  Filled 2014-11-05: qty 1

## 2014-11-05 MED ORDER — NEOSTIGMINE METHYLSULFATE 10 MG/10ML IV SOLN
INTRAVENOUS | Status: AC
  Filled 2014-11-05: qty 1

## 2014-11-05 MED ORDER — SITAGLIP PHOS-METFORMIN HCL ER 50-1000 MG PO TB24: 1.0000 | ORAL_TABLET | Freq: Every day | ORAL | Status: DC

## 2014-11-05 MED ORDER — OXYCODONE-ACETAMINOPHEN 5-325 MG PO TABS
1.0000 | ORAL_TABLET | ORAL | Status: DC | PRN
  Administered 2014-11-05: 2 via ORAL
  Filled 2014-11-05: qty 2

## 2014-11-05 MED ORDER — ALBUTEROL SULFATE (2.5 MG/3ML) 0.083% IN NEBU: 2.5000 mg | INHALATION_SOLUTION | Freq: Four times a day (QID) | RESPIRATORY_TRACT | Status: DC | PRN

## 2014-11-05 MED ORDER — OXYCODONE-ACETAMINOPHEN 10-325 MG PO TABS: 1.0000 | ORAL_TABLET | Freq: Four times a day (QID) | ORAL | Status: DC | PRN

## 2014-11-05 MED ORDER — NEOSTIGMINE METHYLSULFATE 10 MG/10ML IV SOLN
INTRAVENOUS | Status: DC | PRN
  Administered 2014-11-05: 3 mg via INTRAVENOUS

## 2014-11-05 MED ORDER — PROPOFOL 10 MG/ML IV BOLUS
INTRAVENOUS | Status: DC | PRN
  Administered 2014-11-05: 250 mg via INTRAVENOUS

## 2014-11-05 MED ORDER — ONDANSETRON HCL 4 MG/2ML IJ SOLN
INTRAMUSCULAR | Status: DC | PRN
  Administered 2014-11-05: 4 mg via INTRAVENOUS

## 2014-11-05 MED ORDER — CANAGLIFLOZIN 300 MG PO TABS
300.0000 mg | ORAL_TABLET | Freq: Every day | ORAL | Status: DC
  Filled 2014-11-05: qty 300

## 2014-11-05 MED ORDER — THROMBIN 5000 UNITS EX SOLR
OROMUCOSAL | Status: DC | PRN
  Administered 2014-11-05: 09:00:00 via TOPICAL

## 2014-11-05 MED ORDER — LIDOCAINE-EPINEPHRINE 1 %-1:100000 IJ SOLN
INTRAMUSCULAR | Status: DC | PRN
  Administered 2014-11-05: 8 mL

## 2014-11-05 MED ORDER — ONDANSETRON HCL 4 MG/2ML IJ SOLN
INTRAMUSCULAR | Status: AC
  Filled 2014-11-05: qty 2

## 2014-11-05 MED ORDER — SODIUM CHLORIDE 0.9 % IJ SOLN
INTRAMUSCULAR | Status: AC
  Filled 2014-11-05: qty 10

## 2014-11-05 MED ORDER — HEPARIN SODIUM (PORCINE) 5000 UNIT/ML IJ SOLN
5000.0000 [IU] | Freq: Three times a day (TID) | INTRAMUSCULAR | Status: DC
  Filled 2014-11-05: qty 1

## 2014-11-05 MED ORDER — MIDAZOLAM HCL 2 MG/2ML IJ SOLN
INTRAMUSCULAR | Status: AC
  Filled 2014-11-05: qty 2

## 2014-11-05 MED ORDER — FENTANYL CITRATE (PF) 100 MCG/2ML IJ SOLN
INTRAMUSCULAR | Status: DC | PRN
  Administered 2014-11-05 (×2): 100 ug via INTRAVENOUS
  Administered 2014-11-05: 50 ug via INTRAVENOUS

## 2014-11-05 MED ORDER — LACTULOSE 10 GM/15ML PO SOLN
30.0000 g | Freq: Every day | ORAL | Status: DC | PRN
Start: 2014-11-05 — End: 2014-11-05
  Filled 2014-11-05: qty 45

## 2014-11-05 MED ORDER — 0.9 % SODIUM CHLORIDE (POUR BTL) OPTIME
TOPICAL | Status: DC | PRN
  Administered 2014-11-05: 1000 mL

## 2014-11-05 MED ORDER — LACTATED RINGERS IV SOLN
INTRAVENOUS | Status: DC | PRN
  Administered 2014-11-05 (×2): via INTRAVENOUS

## 2014-11-05 MED ORDER — TIZANIDINE HCL 4 MG PO TABS
4.0000 mg | ORAL_TABLET | Freq: Three times a day (TID) | ORAL | Status: DC | PRN
  Administered 2014-11-05: 4 mg via ORAL
  Filled 2014-11-05 (×2): qty 1

## 2014-11-05 MED ORDER — MIDAZOLAM HCL 5 MG/5ML IJ SOLN
INTRAMUSCULAR | Status: DC | PRN
  Administered 2014-11-05: 2 mg via INTRAVENOUS

## 2014-11-05 MED ORDER — PROMETHAZINE HCL 25 MG/ML IJ SOLN
6.2500 mg | INTRAMUSCULAR | Status: DC | PRN
Start: 2014-11-05 — End: 2014-11-05

## 2014-11-05 MED ORDER — LINAGLIPTIN 5 MG PO TABS
5.0000 mg | ORAL_TABLET | Freq: Every day | ORAL | Status: DC
  Administered 2014-11-05: 5 mg via ORAL
  Filled 2014-11-05: qty 1

## 2014-11-05 MED ORDER — SODIUM CHLORIDE 0.9 % IJ SOLN
3.0000 mL | Freq: Two times a day (BID) | INTRAMUSCULAR | Status: DC
  Administered 2014-11-05: 3 mL via INTRAVENOUS

## 2014-11-05 MED ORDER — OXYCODONE HCL 5 MG/5ML PO SOLN: 5.0000 mg | Freq: Once | ORAL | Status: DC | PRN

## 2014-11-05 MED ORDER — METFORMIN HCL ER 500 MG PO TB24
1000.0000 mg | ORAL_TABLET | Freq: Every day | ORAL | Status: DC
  Administered 2014-11-05: 1000 mg via ORAL
  Filled 2014-11-05 (×2): qty 2

## 2014-11-05 MED ORDER — PHENOL 1.4 % MT LIQD
1.0000 | OROMUCOSAL | Status: DC | PRN
Start: 2014-11-05 — End: 2014-11-05

## 2014-11-05 MED ORDER — INSULIN ASPART PROT & ASPART (70-30 MIX) 100 UNIT/ML ~~LOC~~ SUSP
30.0000 [IU] | Freq: Two times a day (BID) | SUBCUTANEOUS | Status: DC
  Administered 2014-11-05: 30 [IU] via SUBCUTANEOUS
  Filled 2014-11-05: qty 10

## 2014-11-05 MED ORDER — BUDESONIDE-FORMOTEROL FUMARATE 160-4.5 MCG/ACT IN AERO
2.0000 | INHALATION_SPRAY | Freq: Two times a day (BID) | RESPIRATORY_TRACT | Status: DC
  Filled 2014-11-05: qty 6

## 2014-11-05 MED ORDER — ACETAMINOPHEN 650 MG RE SUPP: 650.0000 mg | RECTAL | Status: DC | PRN

## 2014-11-05 MED ORDER — PHENYLEPHRINE HCL 10 MG/ML IJ SOLN
INTRAMUSCULAR | Status: DC | PRN
  Administered 2014-11-05 (×5): 80 ug via INTRAVENOUS

## 2014-11-05 MED ORDER — ACETAMINOPHEN 325 MG PO TABS: 650.0000 mg | ORAL_TABLET | ORAL | Status: DC | PRN

## 2014-11-05 SURGICAL SUPPLY — 70 items
BAG DECANTER FOR FLEXI CONT (MISCELLANEOUS) ×2 IMPLANT
BENZOIN TINCTURE PRP APPL 2/3 (GAUZE/BANDAGES/DRESSINGS) ×2 IMPLANT
BLADE CLIPPER SURG (BLADE) IMPLANT
BLADE SURG 11 STRL SS (BLADE) ×2 IMPLANT
BUR MATCHSTICK NEURO 3.0 LAGG (BURR) ×2 IMPLANT
CAGE PEEK VISTA-S CERV 11X14X5 (Cage) ×2 IMPLANT
CANISTER SUCT 3000ML PPV (MISCELLANEOUS) ×2 IMPLANT
CONT SPEC 4OZ CLIKSEAL STRL BL (MISCELLANEOUS) ×2 IMPLANT
DECANTER SPIKE VIAL GLASS SM (MISCELLANEOUS) ×2 IMPLANT
DRAIN CHANNEL 10M FLAT 3/4 FLT (DRAIN) IMPLANT
DRAPE C-ARM 42X72 X-RAY (DRAPES) ×4 IMPLANT
DRAPE LAPAROTOMY 100X72 PEDS (DRAPES) ×2 IMPLANT
DRAPE MICROSCOPE LEICA (MISCELLANEOUS) ×2 IMPLANT
DRAPE POUCH INSTRU U-SHP 10X18 (DRAPES) ×2 IMPLANT
DRAPE PROXIMA HALF (DRAPES) IMPLANT
DRSG OPSITE 4X5.5 SM (GAUZE/BANDAGES/DRESSINGS) ×10 IMPLANT
DRSG OPSITE POSTOP 3X4 (GAUZE/BANDAGES/DRESSINGS) ×2 IMPLANT
DRSG OPSITE POSTOP 4X6 (GAUZE/BANDAGES/DRESSINGS) ×2 IMPLANT
DRSG TEGADERM 4X4.75 (GAUZE/BANDAGES/DRESSINGS) IMPLANT
DURAPREP 6ML APPLICATOR 50/CS (WOUND CARE) ×2 IMPLANT
ELECT COATED BLADE 2.86 ST (ELECTRODE) ×2 IMPLANT
ELECT REM PT RETURN 9FT ADLT (ELECTROSURGICAL) ×2
ELECTRODE REM PT RTRN 9FT ADLT (ELECTROSURGICAL) ×1 IMPLANT
EVACUATOR SILICONE 100CC (DRAIN) IMPLANT
GAUZE SPONGE 4X4 16PLY XRAY LF (GAUZE/BANDAGES/DRESSINGS) IMPLANT
GLOVE BIOGEL PI IND STRL 7.0 (GLOVE) ×2 IMPLANT
GLOVE BIOGEL PI IND STRL 7.5 (GLOVE) ×2 IMPLANT
GLOVE BIOGEL PI IND STRL 8.5 (GLOVE) ×2 IMPLANT
GLOVE BIOGEL PI INDICATOR 7.0 (GLOVE) ×2
GLOVE BIOGEL PI INDICATOR 7.5 (GLOVE) ×2
GLOVE BIOGEL PI INDICATOR 8.5 (GLOVE) ×2
GLOVE ECLIPSE 7.0 STRL STRAW (GLOVE) IMPLANT
GLOVE EXAM NITRILE LRG STRL (GLOVE) IMPLANT
GLOVE EXAM NITRILE MD LF STRL (GLOVE) IMPLANT
GLOVE EXAM NITRILE XL STR (GLOVE) IMPLANT
GLOVE EXAM NITRILE XS STR PU (GLOVE) IMPLANT
GLOVE OPTIFIT SS 6.5 STRL BRWN (GLOVE) ×6 IMPLANT
GLOVE SS N UNI LF 7.0 STRL (GLOVE) ×4 IMPLANT
GLOVE SS PI 9.0 STRL (GLOVE) ×2 IMPLANT
GLOVE SURG SS PI 7.5 STRL IVOR (GLOVE) ×4 IMPLANT
GLOVE SURG SS PI 8.0 STRL IVOR (GLOVE) ×4 IMPLANT
GOWN STRL REUS W/ TWL LRG LVL3 (GOWN DISPOSABLE) ×2 IMPLANT
GOWN STRL REUS W/ TWL XL LVL3 (GOWN DISPOSABLE) ×3 IMPLANT
GOWN STRL REUS W/TWL 2XL LVL3 (GOWN DISPOSABLE) IMPLANT
GOWN STRL REUS W/TWL LRG LVL3 (GOWN DISPOSABLE) ×2
GOWN STRL REUS W/TWL XL LVL3 (GOWN DISPOSABLE) ×3
HEMOSTAT POWDER KIT SURGIFOAM (HEMOSTASIS) ×2 IMPLANT
KIT BASIN OR (CUSTOM PROCEDURE TRAY) ×2 IMPLANT
KIT ROOM TURNOVER OR (KITS) ×2 IMPLANT
LIQUID BAND (GAUZE/BANDAGES/DRESSINGS) ×2 IMPLANT
NEEDLE HYPO 25X1 1.5 SAFETY (NEEDLE) ×2 IMPLANT
NEEDLE SPNL 22GX3.5 QUINCKE BK (NEEDLE) ×2 IMPLANT
NS IRRIG 1000ML POUR BTL (IV SOLUTION) ×2 IMPLANT
PACK LAMINECTOMY NEURO (CUSTOM PROCEDURE TRAY) ×2 IMPLANT
PAD ARMBOARD 7.5X6 YLW CONV (MISCELLANEOUS) ×6 IMPLANT
PLATE INVIZIA 1 LEV 22MM (Plate) ×2 IMPLANT
PUTTY BONE GRAFT KIT 2.5ML (Bone Implant) ×2 IMPLANT
RUBBERBAND STERILE (MISCELLANEOUS) ×4 IMPLANT
SCREW SD FIXED 12MM (Screw) ×8 IMPLANT
SPONGE INTESTINAL PEANUT (DISPOSABLE) ×2 IMPLANT
SPONGE SURGIFOAM ABS GEL SZ50 (HEMOSTASIS) ×2 IMPLANT
STRIP CLOSURE SKIN 1/2X4 (GAUZE/BANDAGES/DRESSINGS) IMPLANT
SUT ETHILON 3 0 FSL (SUTURE) IMPLANT
SUT VIC AB 3-0 SH 8-18 (SUTURE) ×2 IMPLANT
SUT VICRYL 3-0 RB1 18 ABS (SUTURE) ×6 IMPLANT
SYR 20ML ECCENTRIC (SYRINGE) ×2 IMPLANT
TAPE CLOTH 2X10 TAN LF (GAUZE/BANDAGES/DRESSINGS) ×2 IMPLANT
TOWEL OR 17X24 6PK STRL BLUE (TOWEL DISPOSABLE) ×2 IMPLANT
TOWEL OR 17X26 10 PK STRL BLUE (TOWEL DISPOSABLE) ×2 IMPLANT
WATER STERILE IRR 1000ML POUR (IV SOLUTION) ×2 IMPLANT

## 2014-11-05 NOTE — Progress Notes (Addendum)
Patient alert and oriented, mae's well, voiding adequate amount of urine, swallowing without difficulty, c/o mild pain. Patient discharged home with family. RN informed patient and patient's daughter to make sure patient uses CPAP at home as ordered. Script and discharged instructions given to patient. Patient and family stated understanding of d/c instructions given and has an appointment with MD.

## 2014-11-05 NOTE — Op Note (Signed)
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 Discectomy at C5-6 for decompression of spinal cord and exiting nerve roots  2. Placement of intervertebral biomechanical device, 90mm lordotic Zimmer PEEK cage 3. Placement of anterior instrumentation consisting of interbody plate and screws - 27OJ Zimmer at C5-C6  4. Use of morselized bone allograft  5. Arthrodesis C5-6, anterior interbody technique  6. Use of intraoperative microscope  SURGEON: Dr. Consuella Lose, MD  ASSISTANT: Dr. Kary Kos, MD  ANESTHESIA: General Endotracheal  EBL: 50cc  SPECIMENS: None  DRAINS: None  COMPLICATIONS: None immediate  CONDITION: Hemodynamically stable to PACU  HISTORY: Diana Henderson is a 44 y.o. y.o. female who initially presented to the outpatient clinic with signs and symptoms consistent with myelopathy. MRI demonstrated central disc herniation at C5-6. Treatment options were discussed including surgical decompression. We attempted surgical decompression previously, however after induction of anesthesia, before actual skin incision, the patient became severely hypertensive and the case was aborted. She underwent medical workup which did not reveal any identifiable disease process and surgery was rescheduled.   PROCEDURE IN DETAIL: The patient was brought to the operating room and transferred to the operative table. After induction of general anesthesia, the patient was positioned on the operative table in the supine position with all pressure points meticulously padded. The skin of the neck was then prepped and draped in the usual sterile fashion.  After timeout was conducted, the skin was infiltrated with local anesthetic. Skin incision was then made sharply and Bovie electrocautery was used to dissect the subcutaneous tissue until the platysma was identified. The platysma was then divided and undermined. The sternocleidomastoid muscle was  then identified and, utilizing natural fascial planes in the neck, the prevertebral fascia was identified and the carotid sheath was retracted laterally and the trachea and esophagus retracted medially. Again using fluoroscopy, the correct disc space was identified. Bovie electrocautery was used to dissect in the subperiosteal plane and elevate the bilateral longus coli muscles. Self-retaining retractors were then placed. At this point, the microscope was draped and brought into the field, and the remainder of the case was done under the microscope using microdissecting technique.  The disc space was incised sharply and rongeurs were use to initially complete a discectomy. The high-speed drill was then used to complete discectomy until the posterior annulus was identified and removed and the posterior longitudinal ligament was identified. Using a nerve hook, the PLL was elevated, and Kerrison rongeurs were used to remove the posterior longitudinal ligament and the ventral thecal sac was identified. Using a combination of curettes and rongeurs, complete decompression of the thecal sac and exiting nerve roots at this level was completed, and verified using micro-nerve hook.  Having completed our decompression, attention was turned to placement of the intervertebral device. Trial spacers were used to select a 33mm lordotic graft. This graft was then filled with morcellized allograft, and inserted under live fluoroscopy.  After placement of the intervertebral device, the above anterior cervical plate was selected, and placed across the interspace. Using a high-speed drill, the cortex of the cervical vertebral bodies was punctured, and screws inserted in the level above and below. Final fluoroscopic images in lateral projection was taken to confirm good hardware placement.  At this point, after all counts were verified to be correct, meticulous hemostasis was secured using a combination of bipolar electrocautery  and passive hemostatics. The platysma muscle was then closed using interrupted 3-0 Vicryl sutures, and the skin was  closed with an interrupted 3-0 Vicry subcuticular stitch. Dermabond and sterile dressings were then applied and the drapes removed.  The patient tolerated the procedure well and was extubated in the room and taken to the postanesthesia care unit in stable condition.

## 2014-11-05 NOTE — Anesthesia Procedure Notes (Signed)
Procedure Name: Intubation Date/Time: 11/05/2014 7:52 AM Performed by: Rebekah Chesterfield L Pre-anesthesia Checklist: Patient identified, Emergency Drugs available, Suction available, Patient being monitored and Timeout performed Patient Re-evaluated:Patient Re-evaluated prior to inductionOxygen Delivery Method: Circle system utilized Preoxygenation: Pre-oxygenation with 100% oxygen Intubation Type: IV induction and Cricoid Pressure applied Ventilation: Mask ventilation with difficulty and Oral airway inserted - appropriate to patient size Laryngoscope Size: Mac and 4 Grade View: Grade I Tube type: Oral Tube size: 7.5 mm Number of attempts: 1 Airway Equipment and Method: Stylet Placement Confirmation: ETT inserted through vocal cords under direct vision,  positive ETCO2 and breath sounds checked- equal and bilateral Secured at: 20 cm Tube secured with: Tape Dental Injury: Teeth and Oropharynx as per pre-operative assessment

## 2014-11-05 NOTE — Plan of Care (Signed)
Problem: Consults Goal: Diagnosis - Spinal Surgery Outcome: Completed/Met Date Met:  11/05/14 Cervical Spine Fusion

## 2014-11-05 NOTE — Transfer of Care (Signed)
Immediate Anesthesia Transfer of Care Note  Patient: Diana Henderson  Procedure(s) Performed: Procedure(s) with comments: ANTERIOR CERVICAL DECOMPRESSION/DISCECTOMY FUSION 1 LEVEL (N/A) - Cervical five- cervical seven anterior cervical decompression with fusion interbody prosthesis plating and bonegraft  Patient Location: PACU  Anesthesia Type:General  Level of Consciousness: awake, alert , oriented and patient cooperative  Airway & Oxygen Therapy: Patient Spontanous Breathing and Patient connected to nasal cannula oxygen  Post-op Assessment: Report given to RN, Post -op Vital signs reviewed and stable and Patient moving all extremities  Post vital signs: Reviewed and stable  Last Vitals:  Filed Vitals:   11/05/14 1015  BP:   Pulse:   Temp: 37.2 C  Resp:     Complications: No apparent anesthesia complications

## 2014-11-05 NOTE — Anesthesia Preprocedure Evaluation (Addendum)
Anesthesia Evaluation  Patient identified by MRN, date of birth, ID band Patient awake    Reviewed: Allergy & Precautions, NPO status , Patient's Chart, lab work & pertinent test results, reviewed documented beta blocker date and time   Airway Mallampati: III  TM Distance: >3 FB Neck ROM: Full    Dental   Pulmonary shortness of breath, asthma , sleep apnea , former smoker,  breath sounds clear to auscultation        Cardiovascular hypertension, Pt. on medications and Pt. on home beta blockers + DOE Rhythm:Regular Rate:Normal  08/2014 TTE: EF 50-55%. Normal valves.   Neuro/Psych Anxiety Depression Bipolar Disorder negative neurological ROS     GI/Hepatic Neg liver ROS, GERD-  ,  Endo/Other  diabetes, Type 2, Insulin DependentMorbid obesity  Renal/GU negative Renal ROS     Musculoskeletal  (+) Arthritis -,   Abdominal   Peds  Hematology negative hematology ROS (+)   Anesthesia Other Findings   Reproductive/Obstetrics                            Lab Results  Component Value Date   WBC 5.4 10/27/2014   HGB 13.8 10/27/2014   HCT 43.3 10/27/2014   MCV 85.2 10/27/2014   PLT 190 10/27/2014   Lab Results  Component Value Date   CREATININE 0.98 10/27/2014   BUN 8 10/27/2014   NA 138 10/27/2014   K 4.3 10/27/2014   CL 101 10/27/2014   CO2 26 10/27/2014    Anesthesia Physical Anesthesia Plan  ASA: III  Anesthesia Plan: General   Post-op Pain Management:    Induction: Intravenous  Airway Management Planned: Oral ETT  Additional Equipment:   Intra-op Plan:   Post-operative Plan: Extubation in OR  Informed Consent: I have reviewed the patients History and Physical, chart, labs and discussed the procedure including the risks, benefits and alternatives for the proposed anesthesia with the patient or authorized representative who has indicated his/her understanding and acceptance.    Dental advisory given  Plan Discussed with: CRNA  Anesthesia Plan Comments:         Anesthesia Quick Evaluation

## 2014-11-05 NOTE — Discharge Instructions (Signed)

## 2014-11-05 NOTE — Progress Notes (Signed)
Pt seen in PACU. Awake, fluent, following commands. MAE symmetrically. Neck soft.

## 2014-11-05 NOTE — H&P (Signed)
CC:  Hand and arm pain and weakness  HPI: Diana Henderson is a 44 year old woman seen for initial consultation in the office. She comes in as a referral from Dr. Jaynee Eagles for several week history of primarily left hand numbness involving the thumb and index finger. She does have a history of left-sided carpal tunnel surgery. Upon questioning, she says that she does have some pain in the same distribution in the left hand, and has noticed that she continuously drops things. She does also have difficulty with fine movements of the left hand, including handling money, and trouble with buttons and zippers on clothing. She does not report significant neck or arm pain. She does have a chronic history of low back pain. She does also have a chronic history of right foot problems for which she has had surgery a few years ago. She says she has not been able to wear a shoe on the right foot for about 5 years. Because of this, she has not walked normally, but does say that she feels like her balance has been getting worse over the last few months. She denies any changes in bladder function.   PMH: Past Medical History  Diagnosis Date  . Diabetes mellitus   . Hypertension   . Hyperlipidemia   . Depression   . PTSD (post-traumatic stress disorder)   . Pain in joint, ankle and foot 02/17/2013  . Anxiety   . Bipolar 1 disorder   . Sleep apnea     uses CPAP intermittently, pt. doesn't remember where she had the study  . Asthma     has been eval. by Dr. Gwenette Greet- last 06/2014  . Pneumonia     hosp. for a couple of day- not sure when it was   . GERD (gastroesophageal reflux disease)   . Headache     sometimes has migraines   . Arthritis     osteoarthritis     PSH: Past Surgical History  Procedure Laterality Date  . Tubal ligation    . Midtarsal arthrodesis Right 07/17/12    Rt foot  . Remove implant deep Right 07/17/12    Rt foot  . Lapidus fusion Right 01/10/12    Rt foot  . Carpal tunnel release Left 2001     Left wrist  . Gastroc recession extremity Right 3//20/14    Right foot  . Hernia repair      1996  . Remove implant deep Right 07/22/2014    Rt foot @ [facility]  . Cotton osteotomy w/ bone graft Right 09/30/2014    Rt foot @ [facility]    SH: History  Substance Use Topics  . Smoking status: Former Smoker -- 2.00 packs/day for 6 years    Types: Cigarettes    Quit date: 06/18/1978  . Smokeless tobacco: Never Used  . Alcohol Use: 0.0 oz/week    0 Standard drinks or equivalent per week     Comment: weekend- use of beer     MEDS: Prior to Admission medications   Medication Sig Start Date End Date Taking? Authorizing Provider  albuterol (PROVENTIL HFA;VENTOLIN HFA) 108 (90 BASE) MCG/ACT inhaler Inhale 2 puffs into the lungs every 6 (six) hours as needed for wheezing or shortness of breath.   Yes Historical Provider, MD  albuterol (PROVENTIL) (2.5 MG/3ML) 0.083% nebulizer solution Take 2.5 mg by nebulization every 6 (six) hours as needed for wheezing or shortness of breath.   Yes Historical Provider, MD  amLODipine (NORVASC) 10 MG tablet  Take 1 tablet (10 mg total) by mouth daily. 09/10/14  Yes Eugenie Filler, MD  atenolol (TENORMIN) 50 MG tablet Take 1 tablet (50 mg total) by mouth daily. 09/10/14  Yes Eugenie Filler, MD  budesonide-formoterol [facility]) 160-4.5 MCG/ACT inhaler Inhale 2 puffs into the lungs 2 (two) times daily.   Yes Historical Provider, MD  DULoxetine (CYMBALTA) 60 MG capsule Take 60 mg by mouth at bedtime.    Yes Historical Provider, MD  HYDROcodone-acetaminophen (NORCO/VICODIN) 5-325 MG per tablet Take 1-2 tablets by mouth every 4 (four) hours as needed for moderate pain. 09/10/14  Yes Eugenie Filler, MD  insulin NPH-regular Human (NOVOLIN 70/30) (70-30) 100 UNIT/ML injection Inject 30 Units into the skin 2 (two) times daily with a meal.   Yes Historical Provider, MD  INVOKANA 300 MG TABS tablet Take 300 mg by mouth daily. 09/18/14  Yes Historical Provider, MD  JANUMET XR  50-1000 MG TB24 Take 1 tablet by mouth daily. 09/18/14  Yes Historical Provider, MD  lactulose (CHRONULAC) 10 GM/15ML solution Take 45 mLs (30 g total) by mouth 3 (three) times daily. Patient taking differently: Take 30 g by mouth daily as needed for mild constipation.  09/10/14  Yes Eugenie Filler, MD  omeprazole (PRILOSEC) 20 MG capsule Take 20 mg by mouth daily.    Yes Historical Provider, MD  ondansetron (ZOFRAN ODT) 8 MG disintegrating tablet Take 1 tablet (8 mg total) by mouth every 8 (eight) hours as needed for nausea or vomiting. 01/28/14  Yes Sherwood Gambler, MD  tiZANidine (ZANAFLEX) 4 MG tablet Take 1 tablet (4 mg total) by mouth every 8 (eight) hours as needed for muscle spasms. 09/10/14  Yes Eugenie Filler, MD  trimethoprim-polymyxin b (POLYTRIM) ophthalmic solution Place 1 drop into the left eye every 4 (four) hours. Patient taking differently: Place 1 drop into the left eye every 4 (four) hours as needed.  02/19/14  Yes Donne Hazel, MD    ALLERGY: Allergies  Allergen Reactions  . Celery Oil Hives  . Pork-Derived Products Nausea And Vomiting and Other (See Comments)    headache  . Latex Itching    Powdered latex gloves.      ROS: ROS  NEUROLOGIC EXAM: Awake, alert, oriented Memory and concentration grossly intact Speech fluent, appropriate CN grossly intact Motor exam: Upper Extremities Deltoid Bicep Tricep Grip  Right 5/5 5/5 5/5 5/5  Left 5/5 4+/5 5/5 5/5   Lower Extremity IP Quad PF DF EHL  Right 5/5 5/5 5/5 5/5 5/5  Left 5/5 5/5 5/5 5/5 5/5   Sensation grossly intact to LT  IMGAING: MRI of the cervical spine was reviewed. This demonstrates straightening of the normal cervical lordosis. Primary pathologies at C5 C6 where there is a central disc bulge causing severe central stenosis, and spinal cord compression. Inversion recovery sequences does demonstrate some hyperintensity within the spinal cord itself at this level.  IMPRESSION: 44 year old woman with  symptoms of myelopathy likely related to a disc herniation at C5 C6 causing spinal cord compression, and T2 signal change.  PLAN: Proceed with surgical decompression via C5 C6 ACDF   I did review the MRI findings with the patient in the office. I told her that I do believe her symptoms especially the left arm symptoms and possibly her balance problems are related to the disc herniation. Given the spinal cord compression and T2 signal change, I did recommend surgical decompression. The risks of surgery were discussed in detail with the patient  which include but are not limited to spinal cord injury which may result in hand, leg, and bowel dysfunction, postoperative dysphagia, dysphonia, neck hematoma, or subsequent surgery for epidural hematoma. The risk of CSF leak was also discussed. In addition, I explained to him that after spinal fusion surgery, there is a risk of adjacent level disease requiring future surgical intervention. The patient understood our discussion as well as the risks of the surgery and is willing to proceed. All questions were answered.

## 2014-11-05 NOTE — Anesthesia Postprocedure Evaluation (Signed)
  Anesthesia Post-op Note  Patient: Diana Henderson  Procedure(s) Performed: Procedure(s) with comments: ANTERIOR CERVICAL DECOMPRESSION/DISCECTOMY FUSION 1 LEVEL (N/A) - Cervical five- cervical seven anterior cervical decompression with fusion interbody prosthesis plating and bonegraft  Patient Location: PACU  Anesthesia Type:General  Level of Consciousness: awake and alert   Airway and Oxygen Therapy: Patient Spontanous Breathing  Post-op Pain: mild  Post-op Assessment: Post-op Vital signs reviewed  Post-op Vital Signs: Reviewed  Last Vitals:  Filed Vitals:   11/05/14 1207  BP: 124/66  Pulse: 73  Temp: 36.3 C  Resp: 18    Complications: No apparent anesthesia complications

## 2014-11-08 NOTE — Discharge Summary (Signed)
Physician Discharge Summary  Patient ID: Diana Henderson MRN: [id number] DOB/AGE: Sep 24, 1970 44 y.o.  Admit date: 11/05/2014 Discharge date: 11/08/2014  Admission Diagnoses: Cervical spondylosis with myelopath, C5-6  Discharge Diagnoses: Same Active Problems:   Cervical spondylosis with myelopathy   Discharged Condition: Stable  Hospital Course:  Mrs. Diana Henderson is a 44 y.o. female electively admitted after ACDF. Postoperatively, the patient was at neurologic baseline. She was tolerating diet, ambulating independently, voiding normally, with pain controlled with oral medication.  Treatments: Surgery - ACDF C5-6  Discharge Exam: Blood pressure 111/73, pulse 69, temperature 97.8 F (36.6 C), resp. rate 18, height 5\' 6"  (1.676 m), weight 137.44 kg (303 lb), SpO2 100 %. Awake, alert, oriented Speech fluent, appropriate CN grossly intact 5/5 BUE/BLE Wound c/d/i  Follow-up: Follow-up in my office [facility] [phone]) in 2-3 weeks  Disposition: 01-Home or Self Care     Medication List    STOP taking these medications        HYDROcodone-acetaminophen 5-325 MG per tablet  Commonly known as:  NORCO/VICODIN      TAKE these medications        albuterol (2.5 MG/3ML) 0.083% nebulizer solution  Commonly known as:  PROVENTIL  Take 2.5 mg by nebulization every 6 (six) hours as needed for wheezing or shortness of breath.     albuterol 108 (90 BASE) MCG/ACT inhaler  Commonly known as:  PROVENTIL HFA;VENTOLIN HFA  Inhale 2 puffs into the lungs every 6 (six) hours as needed for wheezing or shortness of breath.     amLODipine 10 MG tablet  Commonly known as:  NORVASC  Take 1 tablet (10 mg total) by mouth daily.     atenolol 50 MG tablet  Commonly known as:  TENORMIN  Take 1 tablet (50 mg total) by mouth daily.     budesonide-formoterol 160-4.5 MCG/ACT inhaler  Commonly known as:  SYMBICORT  Inhale 2 puffs into the lungs 2 (two) times  daily.     DULoxetine 60 MG capsule  Commonly known as:  CYMBALTA  Take 60 mg by mouth at bedtime.     insulin NPH-regular Human (70-30) 100 UNIT/ML injection  Commonly known as:  NOVOLIN 70/30  Inject 30 Units into the skin 2 (two) times daily with a meal.     INVOKANA 300 MG Tabs tablet  Generic drug:  canagliflozin  Take 300 mg by mouth daily.     JANUMET XR 50-1000 MG Tb24  Generic drug:  SitaGLIPtin-MetFORMIN HCl  Take 1 tablet by mouth daily.     lactulose 10 GM/15ML solution  Commonly known as:  CHRONULAC  Take 45 mLs (30 g total) by mouth 3 (three) times daily.     omeprazole 20 MG capsule  Commonly known as:  PRILOSEC  Take 20 mg by mouth daily.     ondansetron 8 MG disintegrating tablet  Commonly known as:  ZOFRAN ODT  Take 1 tablet (8 mg total) by mouth every 8 (eight) hours as needed for nausea or vomiting.     oxyCODONE-acetaminophen 10-325 MG per tablet  Commonly known as:  PERCOCET  Take 1 tablet by mouth every 6 (six) hours as needed for pain.     tiZANidine 4 MG tablet  Commonly known as:  ZANAFLEX  Take 1 tablet (4 mg total) by mouth every 8 (eight) hours as needed for muscle spasms.     trimethoprim-polymyxin b ophthalmic solution  Commonly known as:  POLYTRIM  Place 1 drop into the left  eye every 4 (four) hours.           Follow-up Information    Follow up with [facility], Bradley Ferris, MD.   Specialty:  Neurosurgery   Contact information:   [address] [place] [address] [phone]       Signed: Jairo Ben 11/08/2014, 4:17 PM

## 2014-11-09 ENCOUNTER — Encounter (HOSPITAL_COMMUNITY): Payer: Self-pay | Admitting: Neurosurgery

## 2014-11-11 ENCOUNTER — Encounter (HOSPITAL_COMMUNITY): Payer: Self-pay | Admitting: Emergency Medicine

## 2014-11-11 ENCOUNTER — Emergency Department (HOSPITAL_COMMUNITY)
Admission: EM | Admit: 2014-11-11 | Discharge: 2014-11-11 | Disposition: A | Discharge status: Home / Self Care | Payer: Medicare Other | Attending: Emergency Medicine | Admitting: Emergency Medicine

## 2014-11-11 DIAGNOSIS — Z8739 Personal history of other diseases of the musculoskeletal system and connective tissue: Secondary | ICD-10-CM | POA: Insufficient documentation

## 2014-11-11 DIAGNOSIS — G4730 Sleep apnea, unspecified: Secondary | ICD-10-CM | POA: Diagnosis not present

## 2014-11-11 DIAGNOSIS — Z79899 Other long term (current) drug therapy: Secondary | ICD-10-CM | POA: Insufficient documentation

## 2014-11-11 DIAGNOSIS — F419 Anxiety disorder, unspecified: Secondary | ICD-10-CM | POA: Insufficient documentation

## 2014-11-11 DIAGNOSIS — E119 Type 2 diabetes mellitus without complications: Secondary | ICD-10-CM | POA: Diagnosis not present

## 2014-11-11 DIAGNOSIS — M542 Cervicalgia: Secondary | ICD-10-CM | POA: Diagnosis not present

## 2014-11-11 DIAGNOSIS — Z91040 Latex allergy status: Secondary | ICD-10-CM | POA: Diagnosis not present

## 2014-11-11 DIAGNOSIS — Z794 Long term (current) use of insulin: Secondary | ICD-10-CM | POA: Diagnosis not present

## 2014-11-11 DIAGNOSIS — J45909 Unspecified asthma, uncomplicated: Secondary | ICD-10-CM | POA: Insufficient documentation

## 2014-11-11 DIAGNOSIS — G8918 Other acute postprocedural pain: Secondary | ICD-10-CM | POA: Insufficient documentation

## 2014-11-11 DIAGNOSIS — K219 Gastro-esophageal reflux disease without esophagitis: Secondary | ICD-10-CM | POA: Insufficient documentation

## 2014-11-11 DIAGNOSIS — Z87891 Personal history of nicotine dependence: Secondary | ICD-10-CM | POA: Diagnosis not present

## 2014-11-11 DIAGNOSIS — Z9981 Dependence on supplemental oxygen: Secondary | ICD-10-CM | POA: Diagnosis not present

## 2014-11-11 DIAGNOSIS — F319 Bipolar disorder, unspecified: Secondary | ICD-10-CM | POA: Diagnosis not present

## 2014-11-11 DIAGNOSIS — E785 Hyperlipidemia, unspecified: Secondary | ICD-10-CM | POA: Diagnosis not present

## 2014-11-11 DIAGNOSIS — Z7951 Long term (current) use of inhaled steroids: Secondary | ICD-10-CM | POA: Insufficient documentation

## 2014-11-11 DIAGNOSIS — I10 Essential (primary) hypertension: Secondary | ICD-10-CM | POA: Diagnosis not present

## 2014-11-11 DIAGNOSIS — Z8701 Personal history of pneumonia (recurrent): Secondary | ICD-10-CM | POA: Diagnosis not present

## 2014-11-11 LAB — CBC WITH DIFFERENTIAL/PLATELET
BASOS ABS: 0 10*3/uL (ref 0.0–0.1)
Basophils Relative: 0 % (ref 0–1)
EOS PCT: 1 % (ref 0–5)
Eosinophils Absolute: 0 10*3/uL (ref 0.0–0.7)
HCT: 41.7 % (ref 36.0–46.0)
Hemoglobin: 13.2 g/dL (ref 12.0–15.0)
LYMPHS ABS: 1.4 10*3/uL (ref 0.7–4.0)
Lymphocytes Relative: 25 % (ref 12–46)
MCH: 26.7 pg (ref 26.0–34.0)
MCHC: 31.7 g/dL (ref 30.0–36.0)
MCV: 84.4 fL (ref 78.0–100.0)
MONO ABS: 0.5 10*3/uL (ref 0.1–1.0)
Monocytes Relative: 9 % (ref 3–12)
Neutro Abs: 3.7 10*3/uL (ref 1.7–7.7)
Neutrophils Relative %: 65 % (ref 43–77)
Platelets: 215 10*3/uL (ref 150–400)
RBC: 4.94 MIL/uL (ref 3.87–5.11)
RDW: 12.9 % (ref 11.5–15.5)
WBC: 5.6 10*3/uL (ref 4.0–10.5)

## 2014-11-11 LAB — BASIC METABOLIC PANEL
Anion gap: 11 (ref 5–15)
BUN: 6 mg/dL (ref 6–20)
CHLORIDE: 100 mmol/L — AB (ref 101–111)
CO2: 23 mmol/L (ref 22–32)
Calcium: 8.9 mg/dL (ref 8.9–10.3)
Creatinine, Ser: 0.89 mg/dL (ref 0.44–1.00)
GFR calc Af Amer: >60 mL/min (ref >60)
GLUCOSE: 248 mg/dL — AB (ref 65–99)
POTASSIUM: 3.8 mmol/L (ref 3.5–5.1)
Sodium: 134 mmol/L — ABNORMAL LOW (ref 135–145)

## 2014-11-11 MED ORDER — OXYCODONE-ACETAMINOPHEN 5-325 MG PO TABS
2.0000 | ORAL_TABLET | Freq: Once | ORAL | Status: AC
  Administered 2014-11-11: 2 via ORAL
  Filled 2014-11-11: qty 2

## 2014-11-11 MED ORDER — OXYCODONE-ACETAMINOPHEN 5-325 MG PO TABS
2.0000 | ORAL_TABLET | ORAL | Status: DC | PRN
Start: 2014-11-11 — End: 2014-11-12

## 2014-11-11 NOTE — ED Provider Notes (Signed)
CSN: [id number]     Arrival date & time 11/11/14  1351 History   First MD Initiated Contact with Patient 11/11/14 1648     Chief Complaint  Patient presents with  . Post-op Problem     HPI  She presents evaluation of pain along her incision. Had an anterior approach to an anterior cervical fusion with Dr. Nundkumar 5 days ago.  Ran out of her pain medicine yesterday. Feels like her neck is painful swollen today and presents here. No drainage. No fever.  Past Medical History  Diagnosis Date  . Diabetes mellitus   . Hypertension   . Hyperlipidemia   . Depression   . PTSD (post-traumatic stress disorder)   . Pain in joint, ankle and foot 02/17/2013  . Anxiety   . Bipolar 1 disorder   . Sleep apnea     uses CPAP intermittently, pt. doesn't remember where she had the study  . Asthma     has been eval. by Dr. Gwenette Greet- last 06/2014  . Pneumonia     hosp. for a couple of day- not sure when it was   . GERD (gastroesophageal reflux disease)   . Headache     sometimes has migraines   . Arthritis     osteoarthritis    Past Surgical History  Procedure Laterality Date  . Tubal ligation    . Midtarsal arthrodesis Right 07/17/12    Rt foot  . Remove implant deep Right 07/17/12    Rt foot  . Lapidus fusion Right 01/10/12    Rt foot  . Carpal tunnel release Left 2001    Left wrist  . Gastroc recession extremity Right 3//20/14    Right foot  . Hernia repair      1996  . Remove implant deep Right 07/22/2014    Rt foot @ [facility]  . Cotton osteotomy w/ bone graft Right 09/30/2014    Rt foot @ [facility]  . Anterior cervical decomp/discectomy fusion N/A 11/05/2014    Procedure: ANTERIOR CERVICAL DECOMPRESSION/DISCECTOMY FUSION 1 LEVEL;  Surgeon: Consuella Lose, MD;  Location: [facility];  Service: Neurosurgery;  Laterality: N/A;  Cervical five- cervical seven anterior cervical decompression with fusion interbody prosthesis plating and bonegraft   Family History  Problem Relation Age of Onset  .  Heart attack Father   . Kidney disease Mother    History  Substance Use Topics  . Smoking status: Former Smoker -- 2.00 packs/day for 6 years    Types: Cigarettes    Quit date: 06/18/1978  . Smokeless tobacco: Never Used  . Alcohol Use: 0.0 oz/week    0 Standard drinks or equivalent per week     Comment: weekend- use of beer    OB History    No data available     Review of Systems  Constitutional: Negative for fever, chills, diaphoresis, appetite change and fatigue.  HENT: Negative for mouth sores, sore throat and trouble swallowing.   Eyes: Negative for visual disturbance.  Respiratory: Negative for cough, chest tightness, shortness of breath and wheezing.   Cardiovascular: Negative for chest pain.  Gastrointestinal: Negative for nausea, vomiting, abdominal pain, diarrhea and abdominal distention.  Endocrine: Negative for polydipsia, polyphagia and polyuria.  Genitourinary: Negative for dysuria, frequency and hematuria.  Musculoskeletal: Positive for neck pain. Negative for gait problem.  Skin: Negative for color change, pallor and rash.  Neurological: Negative for dizziness, syncope, light-headedness and headaches.  Hematological: Does not bruise/bleed easily.  Psychiatric/Behavioral: Negative for behavioral problems  and confusion.      Allergies  Celery oil; Pork-derived products; and Latex  Home Medications   Prior to Admission medications   Medication Sig Start Date End Date Taking? Authorizing Provider  albuterol (PROVENTIL HFA;VENTOLIN HFA) 108 (90 BASE) MCG/ACT inhaler Inhale 2 puffs into the lungs every 6 (six) hours as needed for wheezing or shortness of breath.   Yes Historical Provider, MD  albuterol (PROVENTIL) (2.5 MG/3ML) 0.083% nebulizer solution Take 2.5 mg by nebulization every 6 (six) hours as needed for wheezing or shortness of breath.   Yes Historical Provider, MD  amLODipine (NORVASC) 10 MG tablet Take 1 tablet (10 mg total) by mouth daily. 09/10/14   Yes Eugenie Filler, MD  atenolol (TENORMIN) 50 MG tablet Take 1 tablet (50 mg total) by mouth daily. 09/10/14  Yes Eugenie Filler, MD  budesonide-formoterol [facility]) 160-4.5 MCG/ACT inhaler Inhale 2 puffs into the lungs 2 (two) times daily.   Yes Historical Provider, MD  DULoxetine (CYMBALTA) 60 MG capsule Take 60 mg by mouth at bedtime.    Yes Historical Provider, MD  hydrOXYzine (ATARAX/VISTARIL) 50 MG tablet Take 50 mg by mouth 3 (three) times daily as needed for anxiety.  10/22/14  Yes Historical Provider, MD  insulin NPH-regular Human (NOVOLIN 70/30) (70-30) 100 UNIT/ML injection Inject 30 Units into the skin 2 (two) times daily with a meal.   Yes Historical Provider, MD  INVOKANA 300 MG TABS tablet Take 300 mg by mouth daily. 09/18/14  Yes Historical Provider, MD  JANUMET XR 50-1000 MG TB24 Take 1 tablet by mouth daily. 09/18/14  Yes Historical Provider, MD  lisinopril (PRINIVIL,ZESTRIL) 20 MG tablet Take 20 mg by mouth daily at 12 noon. 09/10/14  Yes Historical Provider, MD  nabumetone (RELAFEN) 500 MG tablet Take 500 mg by mouth daily as needed for mild pain or moderate pain.  10/28/14  Yes Historical Provider, MD  omeprazole (PRILOSEC) 20 MG capsule Take 20 mg by mouth daily.    Yes Historical Provider, MD  ondansetron (ZOFRAN ODT) 8 MG disintegrating tablet Take 1 tablet (8 mg total) by mouth every 8 (eight) hours as needed for nausea or vomiting. 01/28/14  Yes Sherwood Gambler, MD  risperiDONE (RISPERDAL) 0.5 MG tablet Take 0.5-1 mg by mouth 2 (two) times daily. Take 0.5 mg in the morning and take 1 mg in the evening 10/22/14  Yes Historical Provider, MD  tiZANidine (ZANAFLEX) 4 MG tablet Take 1 tablet (4 mg total) by mouth every 8 (eight) hours as needed for muscle spasms. 09/10/14  Yes Eugenie Filler, MD  lactulose (CHRONULAC) 10 GM/15ML solution Take 45 mLs (30 g total) by mouth 3 (three) times daily. Patient not taking: Reported on 11/11/2014 09/10/14   Eugenie Filler, MD  NOVOLOG MIX  70/30 (70-30) 100 UNIT/ML injection Inject 25 Units into the skin 2 (two) times daily. 09/18/14   Historical Provider, MD  oxyCODONE-acetaminophen (PERCOCET/ROXICET) 5-325 MG per tablet Take 2 tablets by mouth every 4 (four) hours as needed. 11/11/14   Tanna Furry, MD  trimethoprim-polymyxin b (POLYTRIM) ophthalmic solution Place 1 drop into the left eye every 4 (four) hours. Patient not taking: Reported on 11/11/2014 02/19/14   Donne Hazel, MD   BP 126/78 mmHg  Pulse 107  Temp(Src) 97.9 F (36.6 C) (Oral)  Resp 22  Ht 5\' 6"  (1.676 m)  Wt 289 lb (131.09 kg)  BMI 46.67 kg/m2  SpO2 97%  LMP 10/21/2014 Physical Exam  Constitutional: She is oriented to person,  place, and time. She appears well-developed and well-nourished. No distress.  HENT:  Head: Normocephalic.  Eyes: Conjunctivae are normal. Pupils are equal, round, and reactive to light. No scleral icterus.  Neck: Normal range of motion. Neck supple. No thyromegaly present.    Cardiovascular: Normal rate and regular rhythm.  Exam reveals no gallop and no friction rub.   No murmur heard. Pulmonary/Chest: Effort normal and breath sounds normal. No respiratory distress. She has no wheezes. She has no rales.  Abdominal: Soft. Bowel sounds are normal. She exhibits no distension. There is no tenderness. There is no rebound.  Musculoskeletal: Normal range of motion.  Neurological: She is alert and oriented to person, place, and time.  Skin: Skin is warm and dry. No rash noted.  Psychiatric: She has a normal mood and affect. Her behavior is normal.    ED Course  Procedures (including critical care time) Labs Review Labs Reviewed  BASIC METABOLIC PANEL - Abnormal; Notable for the following:    Sodium 134 (*)    Chloride 100 (*)    Glucose, Bld 248 (*)    All other components within normal limits  CBC WITH DIFFERENTIAL/PLATELET    Imaging Review No results found.   EKG Interpretation None      MDM   Final diagnoses:    Post-op pain    Minimal tenderness and induration along the incision. No dehiscence. Otherwise normal exam. I discussed the case with Dr. Arnoldo Morale. Dr. Arnoldo Morale has evaluated the patient. He feel she is appropriate for outpatient treatment was simple pain control.    Tanna Furry, MD 11/11/14 [phone]

## 2014-11-11 NOTE — ED Notes (Signed)
Pt reports she had cervical plates put in on 5/84/83- incision to front of neck. Told to come back with swelling occurs. Pt does have some swelling to the R lateral side of neck- airway intact. No drainage. Pt also reports knot on back of head from laying of the OR table.

## 2014-11-11 NOTE — Consult Note (Signed)
  The patient is a 44 year old morbidly obese black female who Dr. Kathyrn Sheriff performed an anterior cervical discectomy, fusion, and plating on 11/05/2014. The patient was concerned that she had some swelling in her neck so she came to the ER. She also notes that she has run out of Percocet.  On physical exam the patient is alert and pleasant and in no apparent distress. Her right-sided incision has some mild swelling on the what I would consider typical being 6 days postop. There is no midline shift or palpable hematomas. Her voice is normal. She is moving all 4 extremities well. She does not appear ill.  Assessment and plan:  Cervicalgia: The patient's neck clinically does not appear particularly swollen. From my point of view she can be discharged and follow with Dr. Kathyrn Sheriff. I have answered all her questions.

## 2014-11-11 NOTE — Discharge Instructions (Signed)
Recheck with your surgeon tomorrow.

## 2014-11-12 ENCOUNTER — Encounter: Payer: Self-pay | Admitting: Podiatry

## 2014-11-12 ENCOUNTER — Ambulatory Visit (INDEPENDENT_AMBULATORY_CARE_PROVIDER_SITE_OTHER): Payer: Medicare Other | Admitting: Podiatry

## 2014-11-12 ENCOUNTER — Encounter: Payer: Medicare Other | Admitting: Podiatry

## 2014-11-12 DIAGNOSIS — Z9889 Other specified postprocedural states: Secondary | ICD-10-CM

## 2014-11-12 MED ORDER — OXYCODONE-ACETAMINOPHEN 5-325 MG PO TABS: 1.0000 | ORAL_TABLET | ORAL | Status: DC | PRN

## 2014-11-12 NOTE — Patient Instructions (Addendum)
Post op wound doing well. Placed in CAM walker. Return in 3 weeks.

## 2014-11-12 NOTE — Progress Notes (Signed)
4 weeks post op wound. Cotton osteotomy bone graft re-enforced with staple. Patient denies any discomfort. She has had back surgery and rested for the past few weeks.   Cast removed. Well healed without edema or erythema. Right lower limb placed in CAM walker.

## 2014-11-16 ENCOUNTER — Encounter: Payer: Medicare Other | Admitting: Podiatry

## 2014-11-22 DIAGNOSIS — I10 Essential (primary) hypertension: Secondary | ICD-10-CM | POA: Diagnosis not present

## 2014-11-22 DIAGNOSIS — M4712 Other spondylosis with myelopathy, cervical region: Secondary | ICD-10-CM | POA: Diagnosis not present

## 2014-11-22 DIAGNOSIS — Z6841 Body Mass Index (BMI) 40.0 and over, adult: Secondary | ICD-10-CM | POA: Diagnosis not present

## 2014-12-01 DIAGNOSIS — M19079 Primary osteoarthritis, unspecified ankle and foot: Secondary | ICD-10-CM | POA: Diagnosis not present

## 2014-12-01 DIAGNOSIS — J449 Chronic obstructive pulmonary disease, unspecified: Secondary | ICD-10-CM | POA: Diagnosis not present

## 2014-12-01 DIAGNOSIS — K219 Gastro-esophageal reflux disease without esophagitis: Secondary | ICD-10-CM | POA: Diagnosis not present

## 2014-12-01 DIAGNOSIS — E119 Type 2 diabetes mellitus without complications: Secondary | ICD-10-CM | POA: Diagnosis not present

## 2014-12-01 DIAGNOSIS — M79609 Pain in unspecified limb: Secondary | ICD-10-CM | POA: Diagnosis not present

## 2014-12-01 DIAGNOSIS — J45909 Unspecified asthma, uncomplicated: Secondary | ICD-10-CM | POA: Diagnosis not present

## 2014-12-01 DIAGNOSIS — G4733 Obstructive sleep apnea (adult) (pediatric): Secondary | ICD-10-CM | POA: Diagnosis not present

## 2014-12-01 DIAGNOSIS — I119 Hypertensive heart disease without heart failure: Secondary | ICD-10-CM | POA: Diagnosis not present

## 2014-12-01 DIAGNOSIS — N920 Excessive and frequent menstruation with regular cycle: Secondary | ICD-10-CM | POA: Diagnosis not present

## 2014-12-01 DIAGNOSIS — E669 Obesity, unspecified: Secondary | ICD-10-CM | POA: Diagnosis not present

## 2014-12-01 DIAGNOSIS — I10 Essential (primary) hypertension: Secondary | ICD-10-CM | POA: Diagnosis not present

## 2014-12-03 ENCOUNTER — Ambulatory Visit (INDEPENDENT_AMBULATORY_CARE_PROVIDER_SITE_OTHER): Payer: Medicare Other | Admitting: Podiatry

## 2014-12-03 ENCOUNTER — Encounter: Payer: Self-pay | Admitting: Podiatry

## 2014-12-03 DIAGNOSIS — M21961 Unspecified acquired deformity of right lower leg: Secondary | ICD-10-CM

## 2014-12-03 MED ORDER — OXYCODONE-ACETAMINOPHEN 7.5-325 MG PO TABS: 1.0000 | ORAL_TABLET | Freq: Four times a day (QID) | ORAL | Status: DC | PRN

## 2014-12-03 NOTE — Progress Notes (Signed)
Subjective: 2 months post op wound. Cotton osteotomy bone graft re-enforced with staple. Having swelling problem on both legs. Patient denies any discomfort other than soreness after been on feet for a while. . She has had back surgery and recovering well from it.  She is walking with a cane. She can do ambulation without pain but must walk slow.  When the foot and leg swells it takes more pain pills to get comfortable. Patient wants stronger pain medication.  Her blood sugar is under control.  Patient inquire about diabetic shoes. She is wearing bedroom slippers now.   Objective: Well healed wound with bilateral lower limb edema. Radiographic examination reveal good bone to bone contact, grafted bone in place, staple is in place on both side of the grafted bone.  Noted of no displacement of grafted bone or staple.  Assessment: Good wound healing from the last surgery. Lower limb edema bilateral.  Plan: Advised to wear compression socks during the day. Increase activity as tolerated.

## 2014-12-03 NOTE — Patient Instructions (Signed)
Doing well except having lower limb edema on both side. May benefit from compression socks.  Rest, elevated, increase activity as tolerated. Return as needed.

## 2014-12-06 DIAGNOSIS — F315 Bipolar disorder, current episode depressed, severe, with psychotic features: Secondary | ICD-10-CM | POA: Diagnosis not present

## 2014-12-13 DIAGNOSIS — F315 Bipolar disorder, current episode depressed, severe, with psychotic features: Secondary | ICD-10-CM | POA: Diagnosis not present

## 2014-12-15 DIAGNOSIS — F315 Bipolar disorder, current episode depressed, severe, with psychotic features: Secondary | ICD-10-CM | POA: Diagnosis not present

## 2014-12-21 DIAGNOSIS — F315 Bipolar disorder, current episode depressed, severe, with psychotic features: Secondary | ICD-10-CM | POA: Diagnosis not present

## 2014-12-23 DIAGNOSIS — F315 Bipolar disorder, current episode depressed, severe, with psychotic features: Secondary | ICD-10-CM | POA: Diagnosis not present

## 2014-12-27 DIAGNOSIS — F315 Bipolar disorder, current episode depressed, severe, with psychotic features: Secondary | ICD-10-CM | POA: Diagnosis not present

## 2014-12-30 DIAGNOSIS — F4323 Adjustment disorder with mixed anxiety and depressed mood: Secondary | ICD-10-CM | POA: Diagnosis not present

## 2014-12-30 DIAGNOSIS — F331 Major depressive disorder, recurrent, moderate: Secondary | ICD-10-CM | POA: Diagnosis not present

## 2015-01-03 DIAGNOSIS — F4323 Adjustment disorder with mixed anxiety and depressed mood: Secondary | ICD-10-CM | POA: Diagnosis not present

## 2015-01-03 DIAGNOSIS — F331 Major depressive disorder, recurrent, moderate: Secondary | ICD-10-CM | POA: Diagnosis not present

## 2015-01-04 DIAGNOSIS — I119 Hypertensive heart disease without heart failure: Secondary | ICD-10-CM | POA: Diagnosis not present

## 2015-01-04 DIAGNOSIS — K219 Gastro-esophageal reflux disease without esophagitis: Secondary | ICD-10-CM | POA: Diagnosis not present

## 2015-01-04 DIAGNOSIS — J449 Chronic obstructive pulmonary disease, unspecified: Secondary | ICD-10-CM | POA: Diagnosis not present

## 2015-01-04 DIAGNOSIS — J45909 Unspecified asthma, uncomplicated: Secondary | ICD-10-CM | POA: Diagnosis not present

## 2015-01-04 DIAGNOSIS — I10 Essential (primary) hypertension: Secondary | ICD-10-CM | POA: Diagnosis not present

## 2015-01-04 DIAGNOSIS — E119 Type 2 diabetes mellitus without complications: Secondary | ICD-10-CM | POA: Diagnosis not present

## 2015-01-04 DIAGNOSIS — K599 Functional intestinal disorder, unspecified: Secondary | ICD-10-CM | POA: Diagnosis not present

## 2015-01-04 DIAGNOSIS — E669 Obesity, unspecified: Secondary | ICD-10-CM | POA: Diagnosis not present

## 2015-01-10 ENCOUNTER — Ambulatory Visit: Payer: Medicare Other | Admitting: Podiatry

## 2015-01-10 DIAGNOSIS — F4323 Adjustment disorder with mixed anxiety and depressed mood: Secondary | ICD-10-CM | POA: Diagnosis not present

## 2015-01-13 DIAGNOSIS — F4323 Adjustment disorder with mixed anxiety and depressed mood: Secondary | ICD-10-CM | POA: Diagnosis not present

## 2015-01-14 ENCOUNTER — Encounter: Payer: Self-pay | Admitting: Podiatry

## 2015-01-14 ENCOUNTER — Ambulatory Visit (INDEPENDENT_AMBULATORY_CARE_PROVIDER_SITE_OTHER): Payer: Medicare Other | Admitting: Podiatry

## 2015-01-14 VITALS — BP 147/83 | HR 87

## 2015-01-14 DIAGNOSIS — R600 Localized edema: Secondary | ICD-10-CM

## 2015-01-14 DIAGNOSIS — M659 Synovitis and tenosynovitis, unspecified: Secondary | ICD-10-CM | POA: Diagnosis not present

## 2015-01-14 MED ORDER — OXYCODONE-ACETAMINOPHEN 7.5-325 MG PO TABS: 1.0000 | ORAL_TABLET | Freq: Four times a day (QID) | ORAL | Status: DC | PRN

## 2015-01-14 NOTE — Progress Notes (Signed)
Subjective: 44 year old female presents complaining of pain and swelling with numbness, soreness, and tenderness on right foot. .  Swelling is hindering her from wearing tennis shoes. Now she is wearing surgical shoe on right foot.  Wearing compression socks daily. Patient wants refill on her pain medication. Patient walks normal and uses cane.  Objective: Well healed wound with occasional bilateral lower limb edema. Able to wear closed in shoes occasionally.   Assessment: Good wound healing from the last surgery. Lower limb edema bilateral periodic.  Plan: Advised to wear compression socks during the day. Increase activity as tolerated.

## 2015-01-14 NOTE — Patient Instructions (Signed)
Seen for pain and swelling on right foot. Medication refilled. Return as needed.

## 2015-01-17 DIAGNOSIS — F315 Bipolar disorder, current episode depressed, severe, with psychotic features: Secondary | ICD-10-CM | POA: Diagnosis not present

## 2015-01-20 DIAGNOSIS — F315 Bipolar disorder, current episode depressed, severe, with psychotic features: Secondary | ICD-10-CM | POA: Diagnosis not present

## 2015-01-24 ENCOUNTER — Encounter (HOSPITAL_COMMUNITY): Payer: Self-pay | Admitting: Emergency Medicine

## 2015-01-24 ENCOUNTER — Emergency Department (HOSPITAL_COMMUNITY)
Admission: EM | Admit: 2015-01-24 | Discharge: 2015-01-24 | Disposition: A | Discharge status: Home / Self Care | Payer: Medicare Other | Attending: Emergency Medicine | Admitting: Emergency Medicine

## 2015-01-24 ENCOUNTER — Emergency Department (HOSPITAL_COMMUNITY): Payer: Medicare Other

## 2015-01-24 DIAGNOSIS — G8929 Other chronic pain: Secondary | ICD-10-CM | POA: Insufficient documentation

## 2015-01-24 DIAGNOSIS — Z79899 Other long term (current) drug therapy: Secondary | ICD-10-CM | POA: Insufficient documentation

## 2015-01-24 DIAGNOSIS — G43909 Migraine, unspecified, not intractable, without status migrainosus: Secondary | ICD-10-CM | POA: Diagnosis not present

## 2015-01-24 DIAGNOSIS — Z87891 Personal history of nicotine dependence: Secondary | ICD-10-CM | POA: Insufficient documentation

## 2015-01-24 DIAGNOSIS — I119 Hypertensive heart disease without heart failure: Secondary | ICD-10-CM | POA: Diagnosis not present

## 2015-01-24 DIAGNOSIS — Z91040 Latex allergy status: Secondary | ICD-10-CM | POA: Diagnosis not present

## 2015-01-24 DIAGNOSIS — K599 Functional intestinal disorder, unspecified: Secondary | ICD-10-CM | POA: Diagnosis not present

## 2015-01-24 DIAGNOSIS — E119 Type 2 diabetes mellitus without complications: Secondary | ICD-10-CM | POA: Diagnosis not present

## 2015-01-24 DIAGNOSIS — Z794 Long term (current) use of insulin: Secondary | ICD-10-CM | POA: Diagnosis not present

## 2015-01-24 DIAGNOSIS — Z8701 Personal history of pneumonia (recurrent): Secondary | ICD-10-CM | POA: Insufficient documentation

## 2015-01-24 DIAGNOSIS — J449 Chronic obstructive pulmonary disease, unspecified: Secondary | ICD-10-CM | POA: Diagnosis not present

## 2015-01-24 DIAGNOSIS — R200 Anesthesia of skin: Secondary | ICD-10-CM | POA: Insufficient documentation

## 2015-01-24 DIAGNOSIS — F4310 Post-traumatic stress disorder, unspecified: Secondary | ICD-10-CM | POA: Diagnosis not present

## 2015-01-24 DIAGNOSIS — Z9981 Dependence on supplemental oxygen: Secondary | ICD-10-CM | POA: Diagnosis not present

## 2015-01-24 DIAGNOSIS — R202 Paresthesia of skin: Secondary | ICD-10-CM | POA: Insufficient documentation

## 2015-01-24 DIAGNOSIS — G4733 Obstructive sleep apnea (adult) (pediatric): Secondary | ICD-10-CM | POA: Diagnosis not present

## 2015-01-24 DIAGNOSIS — M1990 Unspecified osteoarthritis, unspecified site: Secondary | ICD-10-CM | POA: Insufficient documentation

## 2015-01-24 DIAGNOSIS — J45909 Unspecified asthma, uncomplicated: Secondary | ICD-10-CM | POA: Insufficient documentation

## 2015-01-24 DIAGNOSIS — G4730 Sleep apnea, unspecified: Secondary | ICD-10-CM | POA: Insufficient documentation

## 2015-01-24 DIAGNOSIS — I10 Essential (primary) hypertension: Secondary | ICD-10-CM | POA: Insufficient documentation

## 2015-01-24 DIAGNOSIS — F319 Bipolar disorder, unspecified: Secondary | ICD-10-CM | POA: Diagnosis not present

## 2015-01-24 DIAGNOSIS — F419 Anxiety disorder, unspecified: Secondary | ICD-10-CM | POA: Insufficient documentation

## 2015-01-24 DIAGNOSIS — M7731 Calcaneal spur, right foot: Secondary | ICD-10-CM | POA: Diagnosis not present

## 2015-01-24 DIAGNOSIS — Z7951 Long term (current) use of inhaled steroids: Secondary | ICD-10-CM | POA: Insufficient documentation

## 2015-01-24 DIAGNOSIS — Z792 Long term (current) use of antibiotics: Secondary | ICD-10-CM | POA: Diagnosis not present

## 2015-01-24 DIAGNOSIS — E669 Obesity, unspecified: Secondary | ICD-10-CM | POA: Diagnosis not present

## 2015-01-24 DIAGNOSIS — M79671 Pain in right foot: Secondary | ICD-10-CM | POA: Diagnosis not present

## 2015-01-24 DIAGNOSIS — K219 Gastro-esophageal reflux disease without esophagitis: Secondary | ICD-10-CM | POA: Diagnosis not present

## 2015-01-24 DIAGNOSIS — N920 Excessive and frequent menstruation with regular cycle: Secondary | ICD-10-CM | POA: Diagnosis not present

## 2015-01-24 MED ORDER — OXYCODONE-ACETAMINOPHEN 7.5-325 MG PO TABS: 1.0000 | ORAL_TABLET | ORAL | Status: DC | PRN

## 2015-01-24 MED ORDER — OXYCODONE-ACETAMINOPHEN 5-325 MG PO TABS: 1.0000 | ORAL_TABLET | ORAL | Status: DC | PRN

## 2015-01-24 NOTE — ED Notes (Signed)
Pt sts right sided foot pain x weeks; pt sts multiple sx in foot and having some tingling in foot as well; CMS intact

## 2015-01-24 NOTE — Discharge Instructions (Signed)
You may take the oxycodone prescribed for pain relief.  This will make you drowsy - do not drive within 4 hours of taking this medication.  Minimize weightbearing or any activity that worsens her pain.  As discussed, your x-ray shows that you may have a early non-union complication of your recent surgery, however this also can simply be a surgery that is slow to heal.  Please call your podiatrist on Monday as planned.

## 2015-01-24 NOTE — ED Provider Notes (Signed)
CSN: [id number]     Arrival date & time 01/24/15  1518 History  This chart was scribed for non-physician practitioner Evalee Jefferson, PA-C working with Leo Grosser, MD by Zola Button, ED Scribe. This patient was seen in room TR09C/TR09C and the patient's care was started at 5:47 PM.     Chief Complaint  Patient presents with  . Foot Pain   The history is provided by the patient. No language interpreter was used.    HPI Comments: Diana Henderson is a 44 y.o. female with a history of DM who presents to the Emergency Department complaining of gradual onset right foot pain with swelling that started a few weeks ago. Patient reports having associated numbness and tingling in her toes and foot which is worse with ambulation. Patient notes that her foot sometimes goes sideways causing her to walk on her lateral foot since her most recent foot revision surgery. She has been taking Percocet for the pain. She did see her podiatrist, Dr. Caffie Pinto about a week ago but did not have XR imaging done at that time. Patient notes that she has had 6 surgeries on her foot; her most recent surgery was within the past 6 months, done by Dr. Caffie Pinto at an outpatient location. NKDA. She has used CAM walkers and crutches in the past which has not provided relief. Her podiatrist does not think her foot pain is related to DM.   Past Medical History  Diagnosis Date  . Diabetes mellitus   . Hypertension   . Hyperlipidemia   . Depression   . PTSD (post-traumatic stress disorder)   . Pain in joint, ankle and foot 02/17/2013  . Anxiety   . Bipolar 1 disorder   . Sleep apnea     uses CPAP intermittently, pt. doesn't remember where she had the study  . Asthma     has been eval. by Dr. Gwenette Greet- last 06/2014  . Pneumonia     hosp. for a couple of day- not sure when it was   . GERD (gastroesophageal reflux disease)   . Headache     sometimes has migraines   . Arthritis     osteoarthritis    Past Surgical History  Procedure  Laterality Date  . Tubal ligation    . Midtarsal arthrodesis Right 07/17/12    Rt foot  . Remove implant deep Right 07/17/12    Rt foot  . Lapidus fusion Right 01/10/12    Rt foot  . Carpal tunnel release Left 2001    Left wrist  . Gastroc recession extremity Right 3//20/14    Right foot  . Hernia repair      1996  . Remove implant deep Right 07/22/2014    Rt foot @ [facility]  . Cotton osteotomy w/ bone graft Right 09/30/2014    Rt foot @ [facility]  . Anterior cervical decomp/discectomy fusion N/A 11/05/2014    Procedure: ANTERIOR CERVICAL DECOMPRESSION/DISCECTOMY FUSION 1 LEVEL;  Surgeon: Consuella Lose, MD;  Location: [facility];  Service: Neurosurgery;  Laterality: N/A;  Cervical five- cervical seven anterior cervical decompression with fusion interbody prosthesis plating and bonegraft   Family History  Problem Relation Age of Onset  . Heart attack Father   . Kidney disease Mother    History  Substance Use Topics  . Smoking status: Former Smoker -- 2.00 packs/day for 6 years    Types: Cigarettes    Quit date: 06/18/1978  . Smokeless tobacco: Never Used  . Alcohol Use: 0.0  oz/week    0 Standard drinks or equivalent per week     Comment: weekend- use of beer    OB History    No data available     Review of Systems  Constitutional: Negative for fever.  Musculoskeletal: Positive for joint swelling and arthralgias. Negative for myalgias.  Neurological: Positive for numbness. Negative for weakness.      Allergies  Celery oil; Pork-derived products; and Latex  Home Medications   Prior to Admission medications   Medication Sig Start Date End Date Taking? Authorizing Provider  albuterol (PROVENTIL HFA;VENTOLIN HFA) 108 (90 BASE) MCG/ACT inhaler Inhale 2 puffs into the lungs every 6 (six) hours as needed for wheezing or shortness of breath.    Historical Provider, MD  albuterol (PROVENTIL) (2.5 MG/3ML) 0.083% nebulizer solution Take 2.5 mg by nebulization every 6 (six) hours as  needed for wheezing or shortness of breath.    Historical Provider, MD  amLODipine (NORVASC) 10 MG tablet Take 1 tablet (10 mg total) by mouth daily. 09/10/14   Eugenie Filler, MD  atenolol (TENORMIN) 50 MG tablet Take 1 tablet (50 mg total) by mouth daily. 09/10/14   Eugenie Filler, MD  budesonide-formoterol [facility]) 160-4.5 MCG/ACT inhaler Inhale 2 puffs into the lungs 2 (two) times daily.    Historical Provider, MD  DULoxetine (CYMBALTA) 60 MG capsule Take 60 mg by mouth at bedtime.     Historical Provider, MD  hydrOXYzine (ATARAX/VISTARIL) 50 MG tablet Take 50 mg by mouth 3 (three) times daily as needed for anxiety.  10/22/14   Historical Provider, MD  insulin NPH-regular Human (NOVOLIN 70/30) (70-30) 100 UNIT/ML injection Inject 30 Units into the skin 2 (two) times daily with a meal.    Historical Provider, MD  INVOKANA 300 MG TABS tablet Take 300 mg by mouth daily. 09/18/14   Historical Provider, MD  JANUMET XR 50-1000 MG TB24 Take 1 tablet by mouth daily. 09/18/14   Historical Provider, MD  lactulose (CHRONULAC) 10 GM/15ML solution Take 45 mLs (30 g total) by mouth 3 (three) times daily. 09/10/14   Eugenie Filler, MD  lisinopril (PRINIVIL,ZESTRIL) 20 MG tablet Take 20 mg by mouth daily at 12 noon. 09/10/14   Historical Provider, MD  nabumetone (RELAFEN) 500 MG tablet Take 500 mg by mouth daily as needed for mild pain or moderate pain.  10/28/14   Historical Provider, MD  NOVOLOG MIX 70/30 (70-30) 100 UNIT/ML injection Inject 25 Units into the skin 2 (two) times daily. 09/18/14   Historical Provider, MD  omeprazole (PRILOSEC) 20 MG capsule Take 20 mg by mouth daily.     Historical Provider, MD  ondansetron (ZOFRAN ODT) 8 MG disintegrating tablet Take 1 tablet (8 mg total) by mouth every 8 (eight) hours as needed for nausea or vomiting. 01/28/14   Sherwood Gambler, MD  oxyCODONE-acetaminophen (PERCOCET) 7.5-325 MG per tablet Take 1 tablet by mouth every 4 (four) hours as needed for severe pain.  01/24/15   Evalee Jefferson, PA-C  risperiDONE (RISPERDAL) 0.5 MG tablet Take 0.5-1 mg by mouth 2 (two) times daily. Take 0.5 mg in the morning and take 1 mg in the evening 10/22/14   Historical Provider, MD  tiZANidine (ZANAFLEX) 4 MG tablet Take 1 tablet (4 mg total) by mouth every 8 (eight) hours as needed for muscle spasms. 09/10/14   Eugenie Filler, MD  trimethoprim-polymyxin b Mayra Neer) ophthalmic solution Place 1 drop into the left eye every 4 (four) hours. 02/19/14   Orpah Melter  Wyline Copas, MD   BP 182/101 mmHg  Pulse 87  Temp(Src) 98.1 F (36.7 C) (Oral)  Resp 18  Ht 5\' 6"  (1.676 m)  Wt 279 lb (126.554 kg)  BMI 45.05 kg/m2  SpO2 100%  LMP 01/22/2015 Physical Exam  Constitutional: She appears well-developed and well-nourished.  HENT:  Head: Normocephalic and atraumatic.  Eyes: Conjunctivae are normal.  Neck: Normal range of motion.  Cardiovascular: Normal rate, regular rhythm, normal heart sounds and intact distal pulses.   Pulses equal bilaterally. Pedal pulses intact. Good cap refill.  Pulmonary/Chest: Effort normal and breath sounds normal. She has no wheezes.  Abdominal: Soft. Bowel sounds are normal. There is no tenderness.  Musculoskeletal: Normal range of motion. She exhibits tenderness.  Well healed surgical incision on the dorsal right foot. Mild edema without erythema dorsally. TTP mid-foot.  Neurological: She is alert. She has normal strength. She displays normal reflexes. No sensory deficit.  Skin: Skin is warm and dry.  Psychiatric: She has a normal mood and affect.  Nursing note and vitals reviewed.   ED Course  Procedures  DIAGNOSTIC STUDIES: Oxygen Saturation is 98% on room air, normal by my interpretation.    COORDINATION OF CARE: 6:05 PM-Discussed treatment plan which includes follow-up with podiatrist with patient/guardian at bedside and patient/guardian agreed to plan.    Labs Review Labs Reviewed - No data to display  Imaging Review Dg Foot Complete  Right  01/24/2015   CLINICAL DATA:  Right foot pain.  Multiple prior surgeries.  EXAM: RIGHT FOOT COMPLETE - 3+ VIEW  COMPARISON:  Radiographs dated 04/18/2014 and CT scan dated 07/31/2013  FINDINGS: The patient has had revision of the Lapidus fusion with removal of loop prior plate and screws with insertion of a staple across the first metatarsocuneiform articulation. There appears to be bone graft material at that site. There is slight lucency around the bone graft material. I cannot tell whether there is solid osseous fusion across this surgical site or not.  She chronic dorsal spurring at the talonavicular joint. Small plantar calcaneal spur. Slight arthritis at the ankle joint.  IMPRESSION: Revision of Lapidus fusion. Lucency around the bone graft material suggests the possibility of nonunion.   Electronically Signed   By: Lorriane Shire M.D.   On: 01/24/2015 16:33     EKG Interpretation None      MDM   Final diagnoses:  Chronic foot pain, right    Patients labs and/or radiological studies were reviewed and considered during the medical decision making and disposition process. Imaging was reviewed, interpreted and I agree with radiologists reading.  Results were also discussed with patient.    No exam findings suggesting acute condition, pain is chronic.  Pedal pulses are full, no vascular compromise, calf is soft, nontender.  She was advised she needs f/u with her podiatrist for this condition. She states she has a cam walker at home which has not helped her pain and ambulation, has also used her cane and has a walker to help with weight bearing on the joint.  Concern for home mobility. Offered crutches which she deferred. Gave post op shoe.  Pt was prescribed short course of oxycodone 7.5 in place of her 5 mg tabs for better pain control. Advised to call podiatrist for further evaluation.  Pt understands and agrees with plan.  I personally performed the services described in this  documentation, which was scribed in my presence. The recorded information has been reviewed and is accurate.   Evalee Jefferson,  PA-C 01/25/15 1615  Leo Grosser, MD 01/25/15 [phone]

## 2015-01-28 ENCOUNTER — Encounter: Payer: Self-pay | Admitting: Podiatry

## 2015-01-28 ENCOUNTER — Ambulatory Visit (INDEPENDENT_AMBULATORY_CARE_PROVIDER_SITE_OTHER): Payer: Medicare Other | Admitting: Podiatry

## 2015-01-28 VITALS — BP 192/102 | HR 97

## 2015-01-28 DIAGNOSIS — R600 Localized edema: Secondary | ICD-10-CM | POA: Diagnosis not present

## 2015-01-28 DIAGNOSIS — M659 Synovitis and tenosynovitis, unspecified: Secondary | ICD-10-CM

## 2015-01-28 DIAGNOSIS — M25571 Pain in right ankle and joints of right foot: Secondary | ICD-10-CM

## 2015-01-28 MED ORDER — OXYCODONE-ACETAMINOPHEN 10-325 MG PO TABS: 1.0000 | ORAL_TABLET | Freq: Four times a day (QID) | ORAL | Status: DC | PRN

## 2015-01-28 NOTE — Progress Notes (Signed)
Subjective: 44 year old female presents complaining of pain and swelling on right foot since last Friday (01/21/15). Pain was bad enough for her to go to ER on 01/24/15. X-ray of right foot was done while she was in ER. 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   Objective: Pain in right foot with ambulation. X-ray review shoe dorsal 1/2 of fusion site show lucency.  Possible failing of bone healing at fusion site, Metatarsocuneiform joint where fusion surgery was done.  The surgical site appeared to be fused and patient show intolerance to internal fixation screws and plate, which were removed due to poor tolerance.  She had done Cotton osteotomy with bone graft and staple fixation to balance the forefoot weight bearing. At this time the site appears to be healing with grafted bone incorporating with adjacent bone.  Pain and swelling recurring at the same surgical site.    Assessment: Pain and swelling on right foot possible due to failing of fusion site.   Plan: Reviewed the findings with the patient regard to failing fusion.  Patient appeared to understand and taking it well. Right lower limb placed in BK fiberglass cast.  Will monitor closely.

## 2015-01-28 NOTE — Patient Instructions (Addendum)
Right foot pain. Possible none healing bone. Placed in cast. Return in 2 week or sooner if needed.

## 2015-02-03 DIAGNOSIS — F315 Bipolar disorder, current episode depressed, severe, with psychotic features: Secondary | ICD-10-CM | POA: Diagnosis not present

## 2015-02-04 DIAGNOSIS — F315 Bipolar disorder, current episode depressed, severe, with psychotic features: Secondary | ICD-10-CM | POA: Diagnosis not present

## 2015-02-07 DIAGNOSIS — Z1231 Encounter for screening mammogram for malignant neoplasm of breast: Secondary | ICD-10-CM | POA: Diagnosis not present

## 2015-02-08 DIAGNOSIS — F315 Bipolar disorder, current episode depressed, severe, with psychotic features: Secondary | ICD-10-CM | POA: Diagnosis not present

## 2015-02-09 ENCOUNTER — Ambulatory Visit (INDEPENDENT_AMBULATORY_CARE_PROVIDER_SITE_OTHER): Payer: Medicare Other | Admitting: Podiatry

## 2015-02-09 ENCOUNTER — Encounter: Payer: Self-pay | Admitting: Podiatry

## 2015-02-09 DIAGNOSIS — R600 Localized edema: Secondary | ICD-10-CM

## 2015-02-09 MED ORDER — OXYCODONE-ACETAMINOPHEN 10-325 MG PO TABS: 1.0000 | ORAL_TABLET | Freq: Four times a day (QID) | ORAL | Status: DC | PRN

## 2015-02-09 NOTE — Patient Instructions (Signed)
Stay off of feet and use CAM walker. Return next week if pain increase. Otherwise return in 3 weeks.

## 2015-02-09 NOTE — Progress Notes (Signed)
Patient complained of swelling on her foot inside her cast. Patient was told to come in. Cast removed. No abnormal distal edema noted. Some localized puffy tissue over 2nd and 3rd MCJ area without redness or pain with pressure. Patient wants to try without cast for a while. Instructed to stay in CAM walker when she gets home. She has multiple CAM walker from previous visits.  Stated that she is taking pain pills 1-3 times a day. Last medication is running out already.  Informed to take 1-2 a day as needed. Pain medication refilled.

## 2015-02-11 ENCOUNTER — Ambulatory Visit: Payer: Medicare Other | Admitting: Podiatry

## 2015-02-11 DIAGNOSIS — F315 Bipolar disorder, current episode depressed, severe, with psychotic features: Secondary | ICD-10-CM | POA: Diagnosis not present

## 2015-02-16 ENCOUNTER — Encounter: Payer: Medicare Other | Admitting: Podiatry

## 2015-02-16 DIAGNOSIS — J45909 Unspecified asthma, uncomplicated: Secondary | ICD-10-CM | POA: Diagnosis not present

## 2015-02-16 DIAGNOSIS — F315 Bipolar disorder, current episode depressed, severe, with psychotic features: Secondary | ICD-10-CM | POA: Diagnosis not present

## 2015-02-16 DIAGNOSIS — K219 Gastro-esophageal reflux disease without esophagitis: Secondary | ICD-10-CM | POA: Diagnosis not present

## 2015-02-16 DIAGNOSIS — I10 Essential (primary) hypertension: Secondary | ICD-10-CM | POA: Diagnosis not present

## 2015-02-16 DIAGNOSIS — I119 Hypertensive heart disease without heart failure: Secondary | ICD-10-CM | POA: Diagnosis not present

## 2015-02-16 DIAGNOSIS — Z0000 Encounter for general adult medical examination without abnormal findings: Secondary | ICD-10-CM | POA: Diagnosis not present

## 2015-02-16 DIAGNOSIS — E119 Type 2 diabetes mellitus without complications: Secondary | ICD-10-CM | POA: Diagnosis not present

## 2015-02-16 DIAGNOSIS — J449 Chronic obstructive pulmonary disease, unspecified: Secondary | ICD-10-CM | POA: Diagnosis not present

## 2015-02-16 DIAGNOSIS — E669 Obesity, unspecified: Secondary | ICD-10-CM | POA: Diagnosis not present

## 2015-02-17 DIAGNOSIS — F315 Bipolar disorder, current episode depressed, severe, with psychotic features: Secondary | ICD-10-CM | POA: Diagnosis not present

## 2015-02-22 ENCOUNTER — Encounter: Payer: Self-pay | Admitting: Podiatry

## 2015-02-22 ENCOUNTER — Ambulatory Visit (INDEPENDENT_AMBULATORY_CARE_PROVIDER_SITE_OTHER): Payer: Medicare Other | Admitting: Podiatry

## 2015-02-22 VITALS — BP 175/99 | HR 100

## 2015-02-22 DIAGNOSIS — F315 Bipolar disorder, current episode depressed, severe, with psychotic features: Secondary | ICD-10-CM | POA: Diagnosis not present

## 2015-02-22 DIAGNOSIS — M25571 Pain in right ankle and joints of right foot: Secondary | ICD-10-CM

## 2015-02-22 NOTE — Progress Notes (Signed)
Subjective: Patient returned for follow up on CAM walker after having the cast removed due to swelling. Patient is not wearing CAM walker but using a cast boot.  History reveals that the right foot pain started since 01/21/15.  Pain was bad enough for her to go to ER on 01/24/15.  X-ray of right foot was done while she was in ER.  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   Objective: Pain in right foot with ambulation. Last X-ray revealed dorsal 1/2 of fusion site showing lucency, possible non union of fusion site.Marland Kitchen  Possible failing of bone healing at fusion site, Metatarsocuneiform joint where fusion surgery was done.  The most recent procedure was Cotton osteotomy with bone graft and staple fixation to balance the forefoot weight bearing.  At this time the site appears to be healing with grafted bone incorporating with adjacent bone.  Pain and swelling recurring at the same surgical site.   Assessment: Pain and swelling on right foot possible due to failed fusion site.   Plan: Reviewed the findings with the patient regard to failing fusion.  Patient appeared to understand and taking it well. Discussed surgical intervention to stabilize the old fusion site.

## 2015-02-22 NOTE — Patient Instructions (Signed)
Pain and swelling on right foot. Possible failed fusion. Repair procedure discussed.

## 2015-03-01 DIAGNOSIS — F315 Bipolar disorder, current episode depressed, severe, with psychotic features: Secondary | ICD-10-CM | POA: Diagnosis not present

## 2015-03-03 DIAGNOSIS — S92209A Fracture of unspecified tarsal bone(s) of unspecified foot, initial encounter for closed fracture: Secondary | ICD-10-CM | POA: Diagnosis not present

## 2015-03-03 DIAGNOSIS — T8484XA Pain due to internal orthopedic prosthetic devices, implants and grafts, initial encounter: Secondary | ICD-10-CM | POA: Diagnosis not present

## 2015-03-08 ENCOUNTER — Encounter: Payer: Self-pay | Admitting: Podiatry

## 2015-03-08 ENCOUNTER — Ambulatory Visit (INDEPENDENT_AMBULATORY_CARE_PROVIDER_SITE_OTHER): Payer: Medicare Other | Admitting: Podiatry

## 2015-03-08 DIAGNOSIS — Z9889 Other specified postprocedural states: Secondary | ICD-10-CM

## 2015-03-08 NOTE — Patient Instructions (Signed)
Post op wound healing well. Return in one week.  

## 2015-03-08 NOTE — Progress Notes (Signed)
5 days post op following staple removal (03/03/15).  Stated that some pain at surgery site. No problem getting around. Dressing changed. Wound healing good without complication.  Explained that it is possible the staple that was exposed in joint space, Naviculocuneiform joint may have caused the pain and swelling with ambulation. We will watch and see if removal of staple could resolve the right foot pain.  Return in one week.

## 2015-03-15 ENCOUNTER — Ambulatory Visit (INDEPENDENT_AMBULATORY_CARE_PROVIDER_SITE_OTHER): Payer: Medicare Other | Admitting: Podiatry

## 2015-03-15 ENCOUNTER — Encounter: Payer: Self-pay | Admitting: Podiatry

## 2015-03-15 VITALS — BP 140/77 | HR 93

## 2015-03-15 DIAGNOSIS — H538 Other visual disturbances: Secondary | ICD-10-CM | POA: Diagnosis not present

## 2015-03-15 DIAGNOSIS — H53009 Unspecified amblyopia, unspecified eye: Secondary | ICD-10-CM | POA: Diagnosis not present

## 2015-03-15 DIAGNOSIS — E0865 Diabetes mellitus due to underlying condition with hyperglycemia: Secondary | ICD-10-CM | POA: Diagnosis not present

## 2015-03-15 DIAGNOSIS — Z9889 Other specified postprocedural states: Secondary | ICD-10-CM

## 2015-03-15 MED ORDER — OXYCODONE-ACETAMINOPHEN 10-325 MG PO TABS: 1.0000 | ORAL_TABLET | Freq: Four times a day (QID) | ORAL | Status: DC | PRN

## 2015-03-15 NOTE — Patient Instructions (Signed)
Post op wound check right foot. Has mild forefoot swelling. Use compression stockinet. Sutures removed. May use pain medication as needed. Return in 3 weeks.
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
## 2015-03-18 DIAGNOSIS — F332 Major depressive disorder, recurrent severe without psychotic features: Secondary | ICD-10-CM | POA: Diagnosis not present

## 2015-04-01 DIAGNOSIS — F332 Major depressive disorder, recurrent severe without psychotic features: Secondary | ICD-10-CM | POA: Diagnosis not present

## 2015-04-06 ENCOUNTER — Ambulatory Visit (INDEPENDENT_AMBULATORY_CARE_PROVIDER_SITE_OTHER): Payer: Medicare Other | Admitting: Podiatry

## 2015-04-06 ENCOUNTER — Encounter: Payer: Self-pay | Admitting: Podiatry

## 2015-04-06 ENCOUNTER — Encounter: Payer: Self-pay | Admitting: Pulmonary Disease

## 2015-04-06 DIAGNOSIS — M25571 Pain in right ankle and joints of right foot: Secondary | ICD-10-CM

## 2015-04-06 MED ORDER — OXYCODONE-ACETAMINOPHEN 10-325 MG PO TABS: 1.0000 | ORAL_TABLET | Freq: Four times a day (QID) | ORAL | Status: DC | PRN

## 2015-04-06 NOTE — Progress Notes (Signed)
44 year old female presents stating her right foot hurts to walk. Has had all internal fixation devices removed due to intolerance.  Objective: The foot is inverted during weight bearing right. Dorsal edema and pain with ambulation right foot.  No increased temperature noted on affected limb.  Assessment: Arthropathy right mid foot. Elevated first ray with forefoot varus right. Pain and swelling with ambulation.  Plan: Reviewed findings and available options. Discussed to have the foot compressed with Ace bandage for time being.  Return as needed.

## 2015-04-06 NOTE — Patient Instructions (Signed)
Pain and swelling on right foot. Will use Ace wrap during the day. Remove at night. May take pain medication if needed.

## 2015-04-07 ENCOUNTER — Encounter: Payer: Self-pay | Admitting: Pulmonary Disease

## 2015-04-07 ENCOUNTER — Emergency Department (HOSPITAL_COMMUNITY)
Admission: EM | Admit: 2015-04-07 | Discharge: 2015-04-07 | Disposition: A | Discharge status: Home / Self Care | Payer: Medicare Other | Attending: Emergency Medicine | Admitting: Emergency Medicine

## 2015-04-07 ENCOUNTER — Encounter (HOSPITAL_COMMUNITY): Payer: Self-pay | Admitting: Emergency Medicine

## 2015-04-07 ENCOUNTER — Ambulatory Visit (INDEPENDENT_AMBULATORY_CARE_PROVIDER_SITE_OTHER): Payer: Medicare Other | Admitting: Pulmonary Disease

## 2015-04-07 ENCOUNTER — Emergency Department (HOSPITAL_COMMUNITY): Payer: Medicare Other

## 2015-04-07 ENCOUNTER — Emergency Department (HOSPITAL_COMMUNITY)
Admission: EM | Admit: 2015-04-07 | Discharge: 2015-04-07 | Discharge status: Against Medical Advice | Payer: Medicare Other | Attending: Emergency Medicine | Admitting: Emergency Medicine

## 2015-04-07 ENCOUNTER — Encounter (HOSPITAL_COMMUNITY): Payer: Self-pay | Admitting: Neurology

## 2015-04-07 ENCOUNTER — Encounter: Payer: Medicare Other | Admitting: Podiatry

## 2015-04-07 VITALS — BP 162/98 | HR 103 | Ht 66.0 in | Wt 301.8 lb

## 2015-04-07 DIAGNOSIS — J029 Acute pharyngitis, unspecified: Secondary | ICD-10-CM | POA: Insufficient documentation

## 2015-04-07 DIAGNOSIS — F419 Anxiety disorder, unspecified: Secondary | ICD-10-CM | POA: Insufficient documentation

## 2015-04-07 DIAGNOSIS — J3489 Other specified disorders of nose and nasal sinuses: Secondary | ICD-10-CM | POA: Diagnosis not present

## 2015-04-07 DIAGNOSIS — Z9981 Dependence on supplemental oxygen: Secondary | ICD-10-CM | POA: Insufficient documentation

## 2015-04-07 DIAGNOSIS — G4730 Sleep apnea, unspecified: Secondary | ICD-10-CM | POA: Insufficient documentation

## 2015-04-07 DIAGNOSIS — Z794 Long term (current) use of insulin: Secondary | ICD-10-CM | POA: Insufficient documentation

## 2015-04-07 DIAGNOSIS — Z7982 Long term (current) use of aspirin: Secondary | ICD-10-CM | POA: Insufficient documentation

## 2015-04-07 DIAGNOSIS — J309 Allergic rhinitis, unspecified: Secondary | ICD-10-CM

## 2015-04-07 DIAGNOSIS — K219 Gastro-esophageal reflux disease without esophagitis: Secondary | ICD-10-CM | POA: Diagnosis not present

## 2015-04-07 DIAGNOSIS — F319 Bipolar disorder, unspecified: Secondary | ICD-10-CM | POA: Diagnosis not present

## 2015-04-07 DIAGNOSIS — Z91040 Latex allergy status: Secondary | ICD-10-CM | POA: Insufficient documentation

## 2015-04-07 DIAGNOSIS — J45901 Unspecified asthma with (acute) exacerbation: Secondary | ICD-10-CM | POA: Insufficient documentation

## 2015-04-07 DIAGNOSIS — H578 Other specified disorders of eye and adnexa: Secondary | ICD-10-CM | POA: Insufficient documentation

## 2015-04-07 DIAGNOSIS — Z87891 Personal history of nicotine dependence: Secondary | ICD-10-CM | POA: Insufficient documentation

## 2015-04-07 DIAGNOSIS — I10 Essential (primary) hypertension: Secondary | ICD-10-CM | POA: Insufficient documentation

## 2015-04-07 DIAGNOSIS — M7989 Other specified soft tissue disorders: Secondary | ICD-10-CM | POA: Diagnosis not present

## 2015-04-07 DIAGNOSIS — R05 Cough: Secondary | ICD-10-CM | POA: Insufficient documentation

## 2015-04-07 DIAGNOSIS — M1712 Unilateral primary osteoarthritis, left knee: Secondary | ICD-10-CM

## 2015-04-07 DIAGNOSIS — M1990 Unspecified osteoarthritis, unspecified site: Secondary | ICD-10-CM | POA: Insufficient documentation

## 2015-04-07 DIAGNOSIS — Z79899 Other long term (current) drug therapy: Secondary | ICD-10-CM | POA: Insufficient documentation

## 2015-04-07 DIAGNOSIS — Z792 Long term (current) use of antibiotics: Secondary | ICD-10-CM | POA: Insufficient documentation

## 2015-04-07 DIAGNOSIS — F4310 Post-traumatic stress disorder, unspecified: Secondary | ICD-10-CM | POA: Insufficient documentation

## 2015-04-07 DIAGNOSIS — G8929 Other chronic pain: Secondary | ICD-10-CM | POA: Insufficient documentation

## 2015-04-07 DIAGNOSIS — G4733 Obstructive sleep apnea (adult) (pediatric): Secondary | ICD-10-CM | POA: Insufficient documentation

## 2015-04-07 DIAGNOSIS — R600 Localized edema: Secondary | ICD-10-CM | POA: Diagnosis not present

## 2015-04-07 DIAGNOSIS — H9209 Otalgia, unspecified ear: Secondary | ICD-10-CM | POA: Diagnosis not present

## 2015-04-07 DIAGNOSIS — E119 Type 2 diabetes mellitus without complications: Secondary | ICD-10-CM | POA: Insufficient documentation

## 2015-04-07 DIAGNOSIS — J45909 Unspecified asthma, uncomplicated: Secondary | ICD-10-CM | POA: Insufficient documentation

## 2015-04-07 DIAGNOSIS — M25562 Pain in left knee: Secondary | ICD-10-CM | POA: Diagnosis not present

## 2015-04-07 DIAGNOSIS — G43909 Migraine, unspecified, not intractable, without status migrainosus: Secondary | ICD-10-CM | POA: Insufficient documentation

## 2015-04-07 DIAGNOSIS — M25569 Pain in unspecified knee: Secondary | ICD-10-CM

## 2015-04-07 DIAGNOSIS — Z8701 Personal history of pneumonia (recurrent): Secondary | ICD-10-CM | POA: Insufficient documentation

## 2015-04-07 DIAGNOSIS — Z5320 Procedure and treatment not carried out because of patient's decision for unspecified reasons: Secondary | ICD-10-CM

## 2015-04-07 DIAGNOSIS — Z5329 Procedure and treatment not carried out because of patient's decision for other reasons: Secondary | ICD-10-CM | POA: Insufficient documentation

## 2015-04-07 DIAGNOSIS — Z7951 Long term (current) use of inhaled steroids: Secondary | ICD-10-CM | POA: Insufficient documentation

## 2015-04-07 DIAGNOSIS — R0989 Other specified symptoms and signs involving the circulatory and respiratory systems: Secondary | ICD-10-CM | POA: Insufficient documentation

## 2015-04-07 DIAGNOSIS — M179 Osteoarthritis of knee, unspecified: Secondary | ICD-10-CM | POA: Diagnosis not present

## 2015-04-07 HISTORY — DX: Other chronic pain: G89.29

## 2015-04-07 HISTORY — DX: Pain in unspecified knee: M25.569

## 2015-04-07 MED ORDER — FLUTICASONE PROPIONATE 50 MCG/ACT NA SUSP: 1.0000 | Freq: Two times a day (BID) | NASAL | Status: DC

## 2015-04-07 MED ORDER — IBUPROFEN 800 MG PO TABS
800.0000 mg | ORAL_TABLET | Freq: Once | ORAL | Status: AC
  Administered 2015-04-07: 800 mg via ORAL
  Filled 2015-04-07: qty 1

## 2015-04-07 MED ORDER — MOMETASONE FUROATE 50 MCG/ACT NA SUSP: 2.0000 | Freq: Every day | NASAL | Status: DC

## 2015-04-07 MED ORDER — ALBUTEROL SULFATE HFA 108 (90 BASE) MCG/ACT IN AERS: 1.0000 | INHALATION_SPRAY | Freq: Four times a day (QID) | RESPIRATORY_TRACT | Status: DC | PRN

## 2015-04-07 MED ORDER — FUROSEMIDE 20 MG PO TABS: 20.0000 mg | ORAL_TABLET | Freq: Every day | ORAL | Status: DC

## 2015-04-07 NOTE — ED Notes (Signed)
Patient transported to X-ray 

## 2015-04-07 NOTE — ED Notes (Signed)
Per pt, states cold symptoms for a week, left knee pain/swelling for months

## 2015-04-07 NOTE — Patient Instructions (Signed)
Rx will be sent to [facility] to renew CPAP supplies Get back on CPAP machine & use it for at least 6h every night Call Dr Emilee Hero bonsu for refill on risperdal Rx for lasix 20 mg daily x 2 weeks  Nasonex spray each nare at bedtime

## 2015-04-07 NOTE — Progress Notes (Signed)
Subjective:    Patient ID: Diana Henderson, female    DOB: 1970-10-30, 44 y.o.   MRN: [id number]  HPI   Chief Complaint  Patient presents with  . Sleep Consult    Referred by Dr. Vista Lawman. Pt currently is unable to wear her CPAP due to it needing adjusments, she has not worn it in about 5 months. She previously wore it for 1 hour per night. Pt c/o of  nighttime insomnia and daytime somnolence. Score 70   44 year old morbidly obese woman referred for evaluation of obstructive sleep apnea. Former [facility] patient - has been seen for cough and asthma Cough resolved off ACE. GERD is being treated. Maintained on symbicort for asthma, spirometry nml  PSG 11/2011 showed mild OSA with AHI 14/hour with desaturations to 74% and severe snoring-weight 284 pounds BMI 47. On a subsequent study in 06/2012 CPAP was titrated to 10 cm with no residual events. She was then initiated on CPAP, but apparently has not been compliant she stopped using it in the last 6 months and states that she does not have supplies. She has brought her machine with her today and this appears to be in good condition. She has a nasal mask. She has mental health issues and has been unable to refill her Risperdal since she has not been able to see her psychiatrist due to high co-pay. She states that she is not sleeping at all due to this reason. Epworth sleepiness score is 20. Bedtime is generally around midnight, but since she does not have her Risperdal she has been staying up until early morning hours, some days she sleeps as late as noon. She sleeps on her back with 3-4 pillows, reports nocturia, and feels tired with dryness of mouth and waking up. She has gained 20 pounds since the sleep study. Loud snoring has been noted by her daughter There is no history suggestive of cataplexy, sleep paralysis or parasomnias  She reports new onset pedal edema, but denies orthopnea or paroxysmal nocturnal dyspnea. She reports dyspnea on  exertion which is at her baseline   Past Medical History  Diagnosis Date  . Diabetes mellitus   . Hypertension   . Hyperlipidemia   . Depression   . PTSD (post-traumatic stress disorder)   . Pain in joint, ankle and foot 02/17/2013  . Anxiety   . Bipolar 1 disorder ([place])   . Sleep apnea     uses CPAP intermittently, pt. doesn't remember where she had the study  . Asthma     has been eval. by Dr. Gwenette Greet- last 06/2014  . Pneumonia     hosp. for a couple of day- not sure when it was   . GERD (gastroesophageal reflux disease)   . Headache     sometimes has migraines   . Arthritis     osteoarthritis   . OSA on CPAP      Past Surgical History  Procedure Laterality Date  . Tubal ligation    . Midtarsal arthrodesis Right 07/17/12    Rt foot  . Remove implant deep Right 07/17/12    Rt foot  . Lapidus fusion Right 01/10/12    Rt foot  . Carpal tunnel release Left 2001    Left wrist  . Gastroc recession extremity Right 3//20/14    Right foot  . Hernia repair      1996  . Remove implant deep Right 07/22/2014    Rt foot @ [facility]  . Cotton osteotomy w/  bone graft Right 09/30/2014    Rt foot @ [facility]  . Anterior cervical decomp/discectomy fusion N/A 11/05/2014    Procedure: ANTERIOR CERVICAL DECOMPRESSION/DISCECTOMY FUSION 1 LEVEL;  Surgeon: Consuella Lose, MD;  Location: [facility];  Service: Neurosurgery;  Laterality: N/A;  Cervical five- cervical seven anterior cervical decompression with fusion interbody prosthesis plating and bonegraft    Allergies  Allergen Reactions  . Celery Oil Hives  . Pork-Derived Products Nausea And Vomiting and Other (See Comments)    headache  . Latex Itching    Powdered latex gloves.     Social History   Social History  . Marital Status: Single    Spouse Name: N/A  . Number of Children: 1  . Years of Education: 12   Occupational History  . disabled    Social History Main Topics  . Smoking status: Former Smoker -- 2.00 packs/day for 6 years      Types: Cigarettes    Quit date: 06/18/1978  . Smokeless tobacco: Never Used  . Alcohol Use: 0.0 oz/week    0 Standard drinks or equivalent per week     Comment: weekend- use of beer   . Drug Use: No  . Sexual Activity: No   Other Topics Concern  . Not on file   Social History Narrative   Lives with daughter, works at [employer], does not exercise but is on her feet at work.    Family History  Problem Relation Age of Onset  . Heart attack Father   . Kidney disease Mother     Review of Systems neg for any significant sore throat, dysphagia, itching, sneezing, nasal congestion or excess/ purulent secretions, fever, chills, sweats, unintended wt loss, pleuritic or exertional cp, hempoptysis, orthopnea pnd or change in chronic leg swelling. Also denies presyncope, palpitations, heartburn, abdominal pain, nausea, vomiting, diarrhea or change in bowel or urinary habits, dysuria,hematuria, rash, arthralgias, visual complaints, headache, numbness weakness or ataxia.     Objective:   Physical Exam  Gen. Pleasant, obese, in no distress, normal affect ENT - no lesions, no post nasal drip, class 2-3 airway Neck: No JVD, no thyromegaly, no carotid bruits Lungs: no use of accessory muscles, no dullness to percussion, decreased without rales or rhonchi  Cardiovascular: Rhythm regular, heart sounds  normal, no murmurs or gallops, 1+ peripheral edema Abdomen: soft and non-tender, no hepatosplenomegaly, BS normal. Musculoskeletal: No deformities, no cyanosis or clubbing Neuro:  alert, non focal, no tremors       Assessment & Plan:

## 2015-04-07 NOTE — Assessment & Plan Note (Signed)
Lasix 20 mg daily for 2 weeks then can use when necessary

## 2015-04-07 NOTE — ED Notes (Signed)
Pt reports URI for 2 weeks with cough, runny nose, watery eyes. Also left knee pain for several months. Denies fall or injury.

## 2015-04-07 NOTE — ED Notes (Signed)
PT ambulated out of room home because she did not want to wait anymore.

## 2015-04-07 NOTE — Assessment & Plan Note (Signed)
Rx for lasix 20 mg daily x 2 weeks  She has not taken Bp meds today

## 2015-04-07 NOTE — Discharge Instructions (Signed)
Take your medications as prescribed. You may also continue taking ibuprofen as prescribed over-the-counter and using ice for your knee pain as needed. Follow-up with your primary care provider in the next week. Return to the emergency department if symptoms worsen or new onset of fever, difficulty breathing, wheezing, chest pain, vomiting.

## 2015-04-07 NOTE — Progress Notes (Signed)
   Subjective:    Patient ID: Diana Henderson, female    DOB: 1970-07-10, 44 y.o.   MRN: [id number]  HPI    Review of Systems  Constitutional: Negative for fever and unexpected weight change.  HENT: Positive for congestion, postnasal drip, rhinorrhea and sneezing. Negative for dental problem, ear pain, nosebleeds, sinus pressure, sore throat and trouble swallowing.   Eyes: Positive for itching. Negative for redness.  Respiratory: Positive for cough, chest tightness, shortness of breath and wheezing.   Cardiovascular: Positive for leg swelling. Negative for palpitations.  Gastrointestinal: Negative.  Negative for nausea and vomiting.  Endocrine: Negative.   Genitourinary: Negative.  Negative for dysuria.  Musculoskeletal: Negative.  Negative for joint swelling.  Skin: Negative.  Negative for rash.  Allergic/Immunologic: Positive for environmental allergies.  Neurological: Positive for headaches.  Hematological: Bruises/bleeds easily.  Psychiatric/Behavioral: Negative.  Negative for dysphoric mood. The patient is not nervous/anxious.        Objective:   Physical Exam        Assessment & Plan:

## 2015-04-07 NOTE — ED Provider Notes (Signed)
CSN: [id number]     Arrival date & time 04/07/15  1052 History   First MD Initiated Contact with Patient 04/07/15 1302     Chief Complaint  Patient presents with  . URI  . Knee Pain     (Consider location/radiation/quality/duration/timing/severity/associated sxs/prior Treatment) HPI  Pt eloped from ED AMA without provider contact.  Pt advised by RN that she would be leaving AMA and that a provider was ready to see her.  Pt left and refused to wait or sign any papers.  Past Medical History  Diagnosis Date  . Diabetes mellitus   . Hypertension   . Hyperlipidemia   . Depression   . PTSD (post-traumatic stress disorder)   . Pain in joint, ankle and foot 02/17/2013  . Anxiety   . Bipolar 1 disorder ([place])   . Sleep apnea     uses CPAP intermittently, pt. doesn't remember where she had the study  . Asthma     has been eval. by Dr. Gwenette Greet- last 06/2014  . Pneumonia     hosp. for a couple of day- not sure when it was   . GERD (gastroesophageal reflux disease)   . Headache     sometimes has migraines   . Arthritis     osteoarthritis   . OSA on CPAP   . Knee pain, chronic    Past Surgical History  Procedure Laterality Date  . Tubal ligation    . Midtarsal arthrodesis Right 07/17/12    Rt foot  . Remove implant deep Right 07/17/12    Rt foot  . Lapidus fusion Right 01/10/12    Rt foot  . Carpal tunnel release Left 2001    Left wrist  . Gastroc recession extremity Right 3//20/14    Right foot  . Hernia repair      1996  . Remove implant deep Right 07/22/2014    Rt foot @ [facility]  . Cotton osteotomy w/ bone graft Right 09/30/2014    Rt foot @ [facility]  . Anterior cervical decomp/discectomy fusion N/A 11/05/2014    Procedure: ANTERIOR CERVICAL DECOMPRESSION/DISCECTOMY FUSION 1 LEVEL;  Surgeon: Consuella Lose, MD;  Location: [facility];  Service: Neurosurgery;  Laterality: N/A;  Cervical five- cervical seven anterior cervical decompression with fusion interbody prosthesis plating and  bonegraft   Family History  Problem Relation Age of Onset  . Heart attack Father   . Kidney disease Mother    Social History  Substance Use Topics  . Smoking status: Former Smoker -- 2.00 packs/day for 6 years    Types: Cigarettes    Quit date: 06/18/1978  . Smokeless tobacco: Never Used  . Alcohol Use: 0.0 oz/week    0 Standard drinks or equivalent per week     Comment: weekend- use of beer    OB History    No data available     Review of Systems    Allergies  Celery oil; Pork-derived products; and Latex  Home Medications   Prior to Admission medications   Medication Sig Start Date End Date Taking? Authorizing Provider  albuterol (PROVENTIL HFA;VENTOLIN HFA) 108 (90 BASE) MCG/ACT inhaler Inhale 2 puffs into the lungs every 6 (six) hours as needed for wheezing or shortness of breath.    Historical Provider, MD  albuterol (PROVENTIL) (2.5 MG/3ML) 0.083% nebulizer solution Take 2.5 mg by nebulization every 6 (six) hours as needed for wheezing or shortness of breath.    Historical Provider, MD  amLODipine (NORVASC) 10 MG tablet  Take 1 tablet (10 mg total) by mouth daily. 09/10/14   Eugenie Filler, MD  aspirin 81 MG tablet Take 81 mg by mouth daily.    Historical Provider, MD  atenolol (TENORMIN) 50 MG tablet Take 1 tablet (50 mg total) by mouth daily. 09/10/14   Eugenie Filler, MD  beclomethasone (QVAR) 80 MCG/ACT inhaler Inhale 1 puff into the lungs 2 (two) times daily.    Historical Provider, MD  budesonide-formoterol (SYMBICORT) 160-4.5 MCG/ACT inhaler Inhale 2 puffs into the lungs 2 (two) times daily.    Historical Provider, MD  DULoxetine (CYMBALTA) 60 MG capsule Take 60 mg by mouth at bedtime.     Historical Provider, MD  furosemide (LASIX) 20 MG tablet Take 1 tablet (20 mg total) by mouth daily. 04/07/15 04/21/15  Rigoberto Noel, MD  gabapentin (NEURONTIN) 600 MG tablet Take 600 mg by mouth 3 (three) times daily.    Historical Provider, MD  hydrOXYzine  (ATARAX/VISTARIL) 50 MG tablet Take 50 mg by mouth 3 (three) times daily as needed for anxiety.  10/22/14   Historical Provider, MD  insulin NPH-regular Human (NOVOLIN 70/30) (70-30) 100 UNIT/ML injection Inject 30 Units into the skin 2 (two) times daily with a meal.    Historical Provider, MD  INVOKANA 300 MG TABS tablet Take 300 mg by mouth daily. 09/18/14   Historical Provider, MD  JANUMET XR 50-1000 MG TB24 Take 1 tablet by mouth daily. 09/18/14   Historical Provider, MD  lactulose (CHRONULAC) 10 GM/15ML solution Take 45 mLs (30 g total) by mouth 3 (three) times daily. 09/10/14   Eugenie Filler, MD  mometasone (NASONEX) 50 MCG/ACT nasal spray Place 2 sprays into the nose at bedtime. 04/07/15   Rigoberto Noel, MD  Multiple Vitamin (MULTIVITAMIN) tablet Take 1 tablet by mouth daily.    Historical Provider, MD  nabumetone (RELAFEN) 500 MG tablet Take 500 mg by mouth daily as needed for mild pain or moderate pain.  10/28/14   Historical Provider, MD  NOVOLOG MIX 70/30 (70-30) 100 UNIT/ML injection Inject 25 Units into the skin 2 (two) times daily. 09/18/14   Historical Provider, MD  olmesartan (BENICAR) 20 MG tablet Take 20 mg by mouth daily.    Historical Provider, MD  omeprazole (PRILOSEC) 20 MG capsule Take 20 mg by mouth daily.     Historical Provider, MD  ondansetron (ZOFRAN ODT) 8 MG disintegrating tablet Take 1 tablet (8 mg total) by mouth every 8 (eight) hours as needed for nausea or vomiting. 01/28/14   Sherwood Gambler, MD  oxyCODONE-acetaminophen (PERCOCET) 10-325 MG tablet Take 1 tablet by mouth every 6 (six) hours as needed for pain. 04/06/15   Myeong O Sheard, DPM  Psyllium (METAMUCIL PO) Take by mouth.    Historical Provider, MD  risperiDONE (RISPERDAL) 0.5 MG tablet Take 0.5-1 mg by mouth 2 (two) times daily. Take 0.5 mg in the morning and take 1 mg in the evening 10/22/14   Historical Provider, MD  tiZANidine (ZANAFLEX) 4 MG tablet Take 1 tablet (4 mg total) by mouth every 8 (eight) hours as  needed for muscle spasms. 09/10/14   Eugenie Filler, MD  trimethoprim-polymyxin b Mayra Neer) ophthalmic solution Place 1 drop into the left eye every 4 (four) hours. 02/19/14   Donne Hazel, MD   BP 151/88 mmHg  Pulse 87  Temp(Src) 97.9 F (36.6 C) (Oral)  Resp 18  SpO2 97% Physical Exam  ED Course  Procedures (including critical care time) Labs Review  Labs Reviewed - No data to display  Imaging Review No results found. I have personally reviewed and evaluated these images and lab results as part of my medical decision-making.   EKG Interpretation None      MDM   Final diagnoses:  None    Pt eloped from ED AMA without provider contact.  Pt advised by RN that she would be leaving AMA and that a provider was ready to see her.  Pt left and refused to wait or sign any papers.     Lorayne Bender, PA-C 04/07/15 [place], MD 04/08/15 [phone]

## 2015-04-07 NOTE — ED Provider Notes (Signed)
CSN: [id number]     Arrival date & time 04/07/15  1554 History  By signing my name below, I, Soijett Blue, attest that this documentation has been prepared under the direction and in the presence of Harlene Ramus, PA-C Electronically Signed: Soijett Blue, ED Scribe. 04/07/2015. 5:52 PM.   Chief Complaint  Patient presents with  . URI  . Knee Pain     The history is provided by the patient. No language interpreter was used.    Diana Henderson is a 44 y.o. female who presents to the Emergency Department complaining of cough onset 1 week. She states that she is having associated symptoms of watery eyes, sneezing, congestion, rhinorrhea, intermittent SOB, and intermittent HA. She states that she has not tried any medications for the relief for her symptoms. She denies fever, chills, color change, rash, wound, sore throat, abdominal pain, n/v, and any other symptoms. Denies sick contacts. She notes that she has a medical hx of DM, HTN, and asthma with the last time using her inhaler being last night.   She secondarily complains of progressively worsening, constant, achy, left knee pain onset 2 months. She states that when she walks her left knee will buckle. Denies recent fall, trauma, injury to knee. She was informed several years ago that she had a torn ligament that would need surgery but she didn't get anything completed. Pt is having associated symptoms of left knee swelling. She notes that she has not tried any medications for the relief of her symptoms. She denies color change, wound, rash, and any other symptoms. Pt PCP is Dr. Iona Beard Osei-Bonsu and she has an appointment with them on 04/29/15.   Past Medical History  Diagnosis Date  . Diabetes mellitus   . Hypertension   . Hyperlipidemia   . Depression   . PTSD (post-traumatic stress disorder)   . Pain in joint, ankle and foot 02/17/2013  . Anxiety   . Bipolar 1 disorder ([place])   . Sleep apnea     uses CPAP intermittently, pt.  doesn't remember where she had the study  . Asthma     has been eval. by Dr. Gwenette Greet- last 06/2014  . Pneumonia     hosp. for a couple of day- not sure when it was   . GERD (gastroesophageal reflux disease)   . Headache     sometimes has migraines   . Arthritis     osteoarthritis   . OSA on CPAP   . Knee pain, chronic    Past Surgical History  Procedure Laterality Date  . Tubal ligation    . Midtarsal arthrodesis Right 07/17/12    Rt foot  . Remove implant deep Right 07/17/12    Rt foot  . Lapidus fusion Right 01/10/12    Rt foot  . Carpal tunnel release Left 2001    Left wrist  . Gastroc recession extremity Right 3//20/14    Right foot  . Hernia repair      1996  . Remove implant deep Right 07/22/2014    Rt foot @ [facility]  . Cotton osteotomy w/ bone graft Right 09/30/2014    Rt foot @ [facility]  . Anterior cervical decomp/discectomy fusion N/A 11/05/2014    Procedure: ANTERIOR CERVICAL DECOMPRESSION/DISCECTOMY FUSION 1 LEVEL;  Surgeon: Consuella Lose, MD;  Location: [facility];  Service: Neurosurgery;  Laterality: N/A;  Cervical five- cervical seven anterior cervical decompression with fusion interbody prosthesis plating and bonegraft   Family History  Problem Relation Age  of Onset  . Heart attack Father   . Kidney disease Mother    Social History  Substance Use Topics  . Smoking status: Former Smoker -- 2.00 packs/day for 6 years    Types: Cigarettes    Quit date: 06/18/1978  . Smokeless tobacco: Never Used  . Alcohol Use: 0.0 oz/week    0 Standard drinks or equivalent per week     Comment: weekend- use of beer    OB History    No data available     Review of Systems  Constitutional: Negative for fever and chills.  HENT: Positive for congestion, ear pain, rhinorrhea, sneezing and sore throat.   Respiratory: Positive for cough (dry).   Gastrointestinal: Negative for nausea, vomiting, abdominal pain and diarrhea.  Musculoskeletal: Positive for joint swelling and  arthralgias. Negative for gait problem.  Skin: Negative for color change, pallor, rash and wound.      Allergies  Celery oil; Pork-derived products; and Latex  Home Medications   Prior to Admission medications   Medication Sig Start Date End Date Taking? Authorizing Provider  albuterol (PROVENTIL HFA;VENTOLIN HFA) 108 (90 BASE) MCG/ACT inhaler Inhale 2 puffs into the lungs every 6 (six) hours as needed for wheezing or shortness of breath.    Historical Provider, MD  albuterol (PROVENTIL) (2.5 MG/3ML) 0.083% nebulizer solution Take 2.5 mg by nebulization every 6 (six) hours as needed for wheezing or shortness of breath.    Historical Provider, MD  amLODipine (NORVASC) 10 MG tablet Take 1 tablet (10 mg total) by mouth daily. 09/10/14   Eugenie Filler, MD  aspirin 81 MG tablet Take 81 mg by mouth daily.    Historical Provider, MD  atenolol (TENORMIN) 50 MG tablet Take 1 tablet (50 mg total) by mouth daily. 09/10/14   Eugenie Filler, MD  beclomethasone (QVAR) 80 MCG/ACT inhaler Inhale 1 puff into the lungs 2 (two) times daily.    Historical Provider, MD  budesonide-formoterol (SYMBICORT) 160-4.5 MCG/ACT inhaler Inhale 2 puffs into the lungs 2 (two) times daily.    Historical Provider, MD  DULoxetine (CYMBALTA) 60 MG capsule Take 60 mg by mouth at bedtime.     Historical Provider, MD  furosemide (LASIX) 20 MG tablet Take 1 tablet (20 mg total) by mouth daily. 04/07/15 04/21/15  Rigoberto Noel, MD  gabapentin (NEURONTIN) 600 MG tablet Take 600 mg by mouth 3 (three) times daily.    Historical Provider, MD  hydrOXYzine (ATARAX/VISTARIL) 50 MG tablet Take 50 mg by mouth 3 (three) times daily as needed for anxiety.  10/22/14   Historical Provider, MD  insulin NPH-regular Human (NOVOLIN 70/30) (70-30) 100 UNIT/ML injection Inject 30 Units into the skin 2 (two) times daily with a meal.    Historical Provider, MD  INVOKANA 300 MG TABS tablet Take 300 mg by mouth daily. 09/18/14   Historical Provider, MD   JANUMET XR 50-1000 MG TB24 Take 1 tablet by mouth daily. 09/18/14   Historical Provider, MD  lactulose (CHRONULAC) 10 GM/15ML solution Take 45 mLs (30 g total) by mouth 3 (three) times daily. 09/10/14   Eugenie Filler, MD  mometasone (NASONEX) 50 MCG/ACT nasal spray Place 2 sprays into the nose at bedtime. 04/07/15   Rigoberto Noel, MD  Multiple Vitamin (MULTIVITAMIN) tablet Take 1 tablet by mouth daily.    Historical Provider, MD  nabumetone (RELAFEN) 500 MG tablet Take 500 mg by mouth daily as needed for mild pain or moderate pain.  10/28/14  Historical Provider, MD  NOVOLOG MIX 70/30 (70-30) 100 UNIT/ML injection Inject 25 Units into the skin 2 (two) times daily. 09/18/14   Historical Provider, MD  olmesartan (BENICAR) 20 MG tablet Take 20 mg by mouth daily.    Historical Provider, MD  omeprazole (PRILOSEC) 20 MG capsule Take 20 mg by mouth daily.     Historical Provider, MD  ondansetron (ZOFRAN ODT) 8 MG disintegrating tablet Take 1 tablet (8 mg total) by mouth every 8 (eight) hours as needed for nausea or vomiting. 01/28/14   Sherwood Gambler, MD  oxyCODONE-acetaminophen (PERCOCET) 10-325 MG tablet Take 1 tablet by mouth every 6 (six) hours as needed for pain. 04/06/15   Myeong O Sheard, DPM  Psyllium (METAMUCIL PO) Take by mouth.    Historical Provider, MD  risperiDONE (RISPERDAL) 0.5 MG tablet Take 0.5-1 mg by mouth 2 (two) times daily. Take 0.5 mg in the morning and take 1 mg in the evening 10/22/14   Historical Provider, MD  tiZANidine (ZANAFLEX) 4 MG tablet Take 1 tablet (4 mg total) by mouth every 8 (eight) hours as needed for muscle spasms. 09/10/14   Eugenie Filler, MD  trimethoprim-polymyxin b Mayra Neer) ophthalmic solution Place 1 drop into the left eye every 4 (four) hours. 02/19/14   Donne Hazel, MD   BP 118/79 mmHg  Pulse 105  Temp(Src) 97.5 F (36.4 C) (Oral)  Resp 18  SpO2 100%  LMP 02/05/2015 (Approximate) Physical Exam  Constitutional: She is oriented to person, place, and  time. She appears well-developed and well-nourished. No distress.  Morbidly obese female  HENT:  Head: Normocephalic and atraumatic.  Right Ear: Tympanic membrane normal.  Left Ear: Tympanic membrane normal.  Nose: Nose normal. Right sinus exhibits no maxillary sinus tenderness and no frontal sinus tenderness. Left sinus exhibits no maxillary sinus tenderness and no frontal sinus tenderness.  Mouth/Throat: Uvula is midline, oropharynx is clear and moist and mucous membranes are normal. No oropharyngeal exudate.  Eyes: Conjunctivae and EOM are normal. Pupils are equal, round, and reactive to light.  Neck: Normal range of motion. Neck supple.  Cardiovascular: Normal rate, regular rhythm, normal heart sounds and intact distal pulses.  Exam reveals no gallop and no friction rub.   No murmur heard. Pulmonary/Chest: Effort normal. No respiratory distress. She has wheezes (end expiratory wheezes noted in bilateral middle and lower lobes). She has no rales. She exhibits no tenderness.  Abdominal: Soft. She exhibits no distension.  Musculoskeletal: Normal range of motion.       Left knee: She exhibits normal range of motion, no swelling, no effusion, no ecchymosis, no deformity, no laceration, no erythema, normal alignment, no LCL laxity, normal patellar mobility and no MCL laxity. Tenderness found. Lateral joint line tenderness noted.  Left knee: mild tenderness to superior and lateral left knee. No swelling. 5/5 strength. 2+ pedal pulses. Pt is able to stand and ambulate in room without assistance. Sensation intact.   Lymphadenopathy:    She has no cervical adenopathy.  Neurological: She is alert and oriented to person, place, and time. She has normal strength and normal reflexes. No sensory deficit. Gait normal.  Skin: Skin is warm and dry.  Psychiatric: She has a normal mood and affect. Her behavior is normal.  Nursing note and vitals reviewed.   ED Course  Procedures (including critical care  time) DIAGNOSTIC STUDIES: Oxygen Saturation is 100% on RA, nl by my interpretation.    COORDINATION OF CARE: 5:46 PM Discussed treatment plan with pt  at bedside which includes left knee xray, breathing treatment, CXR, ice, NSAIDs, and pt agreed to plan.   Labs Review Labs Reviewed - No data to display  Imaging Review Dg Chest 2 View  04/07/2015  CLINICAL DATA:  Cough, wheezing, and chest pain for weeks. EXAM: CHEST  2 VIEW COMPARISON:  09/05/2014 FINDINGS: Lung volumes are low. The cardiomediastinal contours are unchanged, heart at the upper limits of normal in size. The lungs are clear. Pulmonary vasculature is normal. No consolidation, pleural effusion, or pneumothorax. No acute osseous abnormalities are seen. There is degenerative change in the thoracic spine. IMPRESSION: Borderline cardiomegaly.  No acute pulmonary process. Electronically Signed   By: Jeb Levering M.D.   On: 04/07/2015 18:29   Dg Knee Complete 4 Views Left  04/07/2015  CLINICAL DATA:  Swelling of the left knee above the patella for 1 week. Initial encounter. EXAM: LEFT KNEE - COMPLETE 4+ VIEW COMPARISON:  11/12/2012 FINDINGS: There is no evidence of fracture, dislocation, or joint effusion. Advanced osteoarthritis of the knee for age with diffuse marginal spurring. Joint narrowing which appeared greater on the 2014 study, especially in the medial compartment, due to technical factors. IMPRESSION: 1. No acute finding. 2. Tricompartment osteoarthritis. Electronically Signed   By: Monte Fantasia M.D.   On: 04/07/2015 16:55   I have personally reviewed and evaluated these images as part of my medical decision-making.  Filed Vitals:   04/07/15 1929  BP: 138/69  Pulse: 93  Temp:   Resp: 16     MDM   Final diagnoses:  Allergic rhinitis, unspecified allergic rhinitis type  Knee pain, chronic, left  Osteoarthritis of left knee, unspecified osteoarthritis type    Patient presents with cold-like symptoms for the  past week and left knee pain/swelling that she has had for the past 2 months. She has not tried anything medications at home. Denies any recent fall, trauma, injury. VSS. Exam reveals a few end expiratory wheezes noted and lower lung fields, no respiratory distress, HEENT exam unremarkable. Left knee TTP at lateral joint line, no swelling, left leg neurovascularly intact. Patient given ice and ibuprofen in the ED.   Left knee x-ray revealed osteoarthritis, no acute findings. Chest x-ray showed no acute pulmonary process. I suspect patient's symptoms are likely due to allergic rhinitis and wheezing is associated with patient's history of asthma. Chronic Knee pain is likely due to osteoarthritis, no evidence of septic joint. I do not feel that any further workup or imaging is warranted at this time. Plan to discharge patient home with decongestion and albuterol inhaler. Patient advised to continue using ice and ibuprofen for knee pain. Patient advised to follow-up with her PCP at her scheduled appointment.  I personally performed the services described in this documentation, which was scribed in my presence. The recorded information has been reviewed and is accurate.    Chesley Noon Novinger, [place] 04/07/15 2112  Harvel Quale, MD 04/11/15 [phone]

## 2015-04-07 NOTE — Assessment & Plan Note (Signed)
Rx will be sent to [facility] to renew CPAP supplies Get back on CPAP machine & use it for at least 6h every night Call Dr Emilee Hero bonsu for refill on risperdal Nasonex spray each nare at bedtime  Weight loss encouraged, compliance with goal of at least 4-6 hrs every night is the expectation. Advised against medications with sedative side effects Cautioned against driving when sleepy - understanding that sleepiness will vary on a day to day basis

## 2015-04-08 DIAGNOSIS — F332 Major depressive disorder, recurrent severe without psychotic features: Secondary | ICD-10-CM | POA: Diagnosis not present

## 2015-04-13 ENCOUNTER — Telehealth: Payer: Self-pay | Admitting: Pulmonary Disease

## 2015-04-13 ENCOUNTER — Other Ambulatory Visit: Payer: Self-pay | Admitting: Pulmonary Disease

## 2015-04-13 DIAGNOSIS — G4733 Obstructive sleep apnea (adult) (pediatric): Secondary | ICD-10-CM

## 2015-04-13 NOTE — Telephone Encounter (Signed)
Received letter from [place] stating that "Unfortunately, [facility] is out of the Medicare Bid area for patient and that patient also will need to have new sleep study before anyone can proceed with CPAP restart or Replacement"  Pt needs CPAP supplies, but does not need new CPAP at this time.  Per Dr. Elsworth Soho, order entered to change DME company for renewal on CPAP supplies x 1 year.    Nothing further needed.

## 2015-04-18 ENCOUNTER — Encounter: Payer: Self-pay | Admitting: Podiatry

## 2015-04-18 ENCOUNTER — Ambulatory Visit (INDEPENDENT_AMBULATORY_CARE_PROVIDER_SITE_OTHER): Payer: Medicare Other | Admitting: Podiatry

## 2015-04-18 DIAGNOSIS — S90852A Superficial foreign body, left foot, initial encounter: Secondary | ICD-10-CM

## 2015-04-18 DIAGNOSIS — M25579 Pain in unspecified ankle and joints of unspecified foot: Secondary | ICD-10-CM | POA: Diagnosis not present

## 2015-04-18 MED ORDER — OXYCODONE-ACETAMINOPHEN 10-325 MG PO TABS: 1.0000 | ORAL_TABLET | Freq: Four times a day (QID) | ORAL | Status: DC | PRN

## 2015-04-18 NOTE — Patient Instructions (Signed)
Bilateral foot pain. Left foot painful lesion opened, drained pus. Need to repeat next week.

## 2015-04-18 NOTE — Progress Notes (Signed)
Subjective: 44 year old female patient came in for check up on right foot. Hurts over the old incision dorsum of right foot when walk right foot.  Also having a palpable lump on left heel that is very sharp and painful x 1 week.   Objective: Pain with weight bearing and ambulation mid foot right.  Able to walk in open toed surgical shoes. No acute edema or erythema on right foot. Left heel has a point point abscess in soft tissue, plantar lateral with pain upon ambulation.  No associated ascending cellulitis or edema left foot.   Assessment: Chronic foot pain right mid foot with history of repeat fusion and bone graft.  Possible failed fusion and arthropathy right foot. Possible foreign body induced local lesion with pin point abscess, without showing foreign object.  Plan: I&D Abscess left heel, followed with betadine dressing. Return in one week. Rx. painpill for right foot pain.

## 2015-04-22 DIAGNOSIS — F332 Major depressive disorder, recurrent severe without psychotic features: Secondary | ICD-10-CM | POA: Diagnosis not present

## 2015-04-26 ENCOUNTER — Encounter: Payer: Self-pay | Admitting: Podiatry

## 2015-04-26 ENCOUNTER — Ambulatory Visit: Payer: Medicare Other | Admitting: Podiatry

## 2015-04-26 ENCOUNTER — Ambulatory Visit (INDEPENDENT_AMBULATORY_CARE_PROVIDER_SITE_OTHER): Payer: Medicare Other | Admitting: Podiatry

## 2015-04-26 VITALS — BP 149/81 | HR 94

## 2015-04-26 DIAGNOSIS — M25571 Pain in right ankle and joints of right foot: Secondary | ICD-10-CM

## 2015-04-26 DIAGNOSIS — M21961 Unspecified acquired deformity of right lower leg: Secondary | ICD-10-CM

## 2015-04-26 NOTE — Patient Instructions (Signed)
Left heel lesion healed. Ace wrap placed for right swollen foot. Discussed possible surgical intervention on right foot in January.  Return as needed.

## 2015-04-26 NOTE — Progress Notes (Signed)
Subjective: 44 year old female patient came in for check up on left heel lesion and right foot pain and swelling. Patient points dorsum just distal to the old incision right foot being swollen and painful.  Left heel is not hurting at this time.  Objective: Clean dry left heel without any lesion. Pain with weight bearing and ambulation mid foot right.  Positive of mild dorsal edema just distal to the old incision right foot, non pitting. Left heel lesion has healed.   Assessment: Chronic foot pain right mid foot with history of repeat fusion and bone graft.  Possible failed fusion and arthropathy right foot. Healed left heel lesion.  Plan: Reviewed possible another surgical intervention to stabilize the first ray right foot in near future. Patient understand the explanation and will go along with the plan.

## 2015-04-28 DIAGNOSIS — M4712 Other spondylosis with myelopathy, cervical region: Secondary | ICD-10-CM | POA: Diagnosis not present

## 2015-05-10 ENCOUNTER — Ambulatory Visit: Payer: Medicare Other | Admitting: Podiatry

## 2015-05-10 ENCOUNTER — Encounter: Payer: Self-pay | Admitting: Podiatry

## 2015-05-10 ENCOUNTER — Ambulatory Visit (INDEPENDENT_AMBULATORY_CARE_PROVIDER_SITE_OTHER): Payer: Medicare Other | Admitting: Podiatry

## 2015-05-10 DIAGNOSIS — M659 Synovitis and tenosynovitis, unspecified: Secondary | ICD-10-CM

## 2015-05-10 DIAGNOSIS — M25571 Pain in right ankle and joints of right foot: Secondary | ICD-10-CM

## 2015-05-10 DIAGNOSIS — S90921D Unspecified superficial injury of right foot, subsequent encounter: Secondary | ICD-10-CM

## 2015-05-10 MED ORDER — OXYCODONE-ACETAMINOPHEN 10-325 MG PO TABS: 1.0000 | ORAL_TABLET | Freq: Four times a day (QID) | ORAL | Status: DC | PRN

## 2015-05-10 NOTE — Patient Instructions (Signed)
Pain in right foot after tripping over yesterday. We will consider re opening the area to stabilize.  Return as needed.

## 2015-05-10 NOTE — Progress Notes (Signed)
Subjective: She tripped and fell over yesterday and the same place, center top of right foot is painful.  Blood sugar usually maintained at 102.  Objective: No abnormal skin lesions. Normal color, no excess swelling. Dull aching pain at the base of the first Metatarsal, sharp pain upon manipulation of the joint area.  Tight Achilles tendon bilateral.   Assessment: Unstable fusion site and pain with ambulation right. Short first Metatarsal bone right. Ankle Equinus bilateral.  Diabetic under control.  Plan: May require re stabilization procedure on right foot.  Also need to consider that patient had more excruciating pain along the screw and plate on surgical site before they were removed. We will consider re opening the first MCJ area and place stabilizing screws.

## 2015-05-16 DIAGNOSIS — J45909 Unspecified asthma, uncomplicated: Secondary | ICD-10-CM | POA: Diagnosis not present

## 2015-05-16 DIAGNOSIS — K219 Gastro-esophageal reflux disease without esophagitis: Secondary | ICD-10-CM | POA: Diagnosis not present

## 2015-05-16 DIAGNOSIS — I119 Hypertensive heart disease without heart failure: Secondary | ICD-10-CM | POA: Diagnosis not present

## 2015-05-16 DIAGNOSIS — I10 Essential (primary) hypertension: Secondary | ICD-10-CM | POA: Diagnosis not present

## 2015-05-16 DIAGNOSIS — G4733 Obstructive sleep apnea (adult) (pediatric): Secondary | ICD-10-CM | POA: Diagnosis not present

## 2015-05-16 DIAGNOSIS — R300 Dysuria: Secondary | ICD-10-CM | POA: Diagnosis not present

## 2015-05-16 DIAGNOSIS — J449 Chronic obstructive pulmonary disease, unspecified: Secondary | ICD-10-CM | POA: Diagnosis not present

## 2015-05-16 DIAGNOSIS — R1900 Intra-abdominal and pelvic swelling, mass and lump, unspecified site: Secondary | ICD-10-CM | POA: Diagnosis not present

## 2015-05-16 DIAGNOSIS — G629 Polyneuropathy, unspecified: Secondary | ICD-10-CM | POA: Diagnosis not present

## 2015-05-16 DIAGNOSIS — M19079 Primary osteoarthritis, unspecified ankle and foot: Secondary | ICD-10-CM | POA: Diagnosis not present

## 2015-05-16 DIAGNOSIS — E119 Type 2 diabetes mellitus without complications: Secondary | ICD-10-CM | POA: Diagnosis not present

## 2015-05-20 DIAGNOSIS — F332 Major depressive disorder, recurrent severe without psychotic features: Secondary | ICD-10-CM | POA: Diagnosis not present

## 2015-05-25 ENCOUNTER — Ambulatory Visit (INDEPENDENT_AMBULATORY_CARE_PROVIDER_SITE_OTHER): Payer: Medicare Other | Admitting: Podiatry

## 2015-05-25 ENCOUNTER — Encounter: Payer: Self-pay | Admitting: Podiatry

## 2015-05-25 VITALS — BP 181/100 | HR 108

## 2015-05-25 DIAGNOSIS — M21961 Unspecified acquired deformity of right lower leg: Secondary | ICD-10-CM

## 2015-05-25 DIAGNOSIS — B353 Tinea pedis: Secondary | ICD-10-CM

## 2015-05-25 MED ORDER — OXYCODONE-ACETAMINOPHEN 10-325 MG PO TABS: 1.0000 | ORAL_TABLET | Freq: Four times a day (QID) | ORAL | Status: DC | PRN

## 2015-05-25 MED ORDER — CLOTRIMAZOLE-BETAMETHASONE 1-0.05 % EX CREA: 1.0000 "application " | TOPICAL_CREAM | Freq: Two times a day (BID) | CUTANEOUS | Status: DC

## 2015-05-25 NOTE — Progress Notes (Signed)
Subjective: Follow up on right foot pain. Discussed to have the area redo, fusion of the first MCJ area right. She is in school taking classes.   Objective: Positive of red and brown bump at instep area right foot, itching. No edema or erythema noted. Dull aching pain at the base of the first Metatarsal, sharp pain upon manipulation of the joint area, no change from the last visit.  Tight Achilles tendon bilateral.   Assessment: Acute tinea pedis right arch, a quarter size skin covered with red brown bumps without active blister. Unstable fusion site and pain with ambulation right. Short first Metatarsal bone right. Ankle Equinus bilateral.  Diabetic under control.  Plan: May benefit from Lotrisone cream for tinea infection.  We will try stabilization procedure on right foot this coming summer while she is out of classes.

## 2015-05-25 NOTE — Patient Instructions (Signed)
Pain in right foot. Discussed possible surgical date. Will do it coming summer. Return next month.

## 2015-05-30 DIAGNOSIS — K219 Gastro-esophageal reflux disease without esophagitis: Secondary | ICD-10-CM | POA: Diagnosis not present

## 2015-05-30 DIAGNOSIS — J449 Chronic obstructive pulmonary disease, unspecified: Secondary | ICD-10-CM | POA: Diagnosis not present

## 2015-05-30 DIAGNOSIS — J45909 Unspecified asthma, uncomplicated: Secondary | ICD-10-CM | POA: Diagnosis not present

## 2015-05-30 DIAGNOSIS — I119 Hypertensive heart disease without heart failure: Secondary | ICD-10-CM | POA: Diagnosis not present

## 2015-05-30 DIAGNOSIS — I10 Essential (primary) hypertension: Secondary | ICD-10-CM | POA: Diagnosis not present

## 2015-05-30 DIAGNOSIS — G4733 Obstructive sleep apnea (adult) (pediatric): Secondary | ICD-10-CM | POA: Diagnosis not present

## 2015-05-30 DIAGNOSIS — E119 Type 2 diabetes mellitus without complications: Secondary | ICD-10-CM | POA: Diagnosis not present

## 2015-05-30 DIAGNOSIS — M19079 Primary osteoarthritis, unspecified ankle and foot: Secondary | ICD-10-CM | POA: Diagnosis not present

## 2015-06-07 ENCOUNTER — Ambulatory Visit: Payer: Medicare Other | Admitting: Adult Health

## 2015-06-14 ENCOUNTER — Ambulatory Visit (INDEPENDENT_AMBULATORY_CARE_PROVIDER_SITE_OTHER): Payer: Medicare Other | Admitting: Podiatry

## 2015-06-14 ENCOUNTER — Encounter: Payer: Self-pay | Admitting: Podiatry

## 2015-06-14 VITALS — BP 143/93 | HR 98

## 2015-06-14 DIAGNOSIS — M25571 Pain in right ankle and joints of right foot: Secondary | ICD-10-CM

## 2015-06-14 DIAGNOSIS — M21961 Unspecified acquired deformity of right lower leg: Secondary | ICD-10-CM

## 2015-06-14 MED ORDER — OXYCODONE-ACETAMINOPHEN 10-325 MG PO TABS: 1.0000 | ORAL_TABLET | Freq: Four times a day (QID) | ORAL | Status: DC | PRN

## 2015-06-14 NOTE — Patient Instructions (Signed)
Pain in right foot. Placed in ankle brace. Pain medication re ordered. Return as needed.

## 2015-06-14 NOTE — Progress Notes (Signed)
Subjective: Follow up on right foot pain.  No new problems other than the right foot pain. She is in school now.  Objective: Dull aching pain at the base of the first Metatarsal, sharp pain upon manipulation of the joint area, no change from the last visit.  Positive of forefoot edema right foot. Positive of elevated first ray with forefoot varus right. Tight Achilles tendon bilateral.   Assessment: Unstable fusion site and pain with ambulation right. Short first Metatarsal bone right. Ankle Equinus bilateral.  Diabetic under control.  Plan: Re order pain medication as requested. We will try stabilization procedure on right foot this coming summer while she is out of classes.

## 2015-06-16 DIAGNOSIS — F332 Major depressive disorder, recurrent severe without psychotic features: Secondary | ICD-10-CM | POA: Diagnosis not present

## 2015-06-23 ENCOUNTER — Ambulatory Visit: Payer: Medicare Other | Admitting: Podiatry

## 2015-06-24 DIAGNOSIS — F332 Major depressive disorder, recurrent severe without psychotic features: Secondary | ICD-10-CM | POA: Diagnosis not present

## 2015-07-07 ENCOUNTER — Encounter: Payer: Self-pay | Admitting: Podiatry

## 2015-07-07 ENCOUNTER — Ambulatory Visit (INDEPENDENT_AMBULATORY_CARE_PROVIDER_SITE_OTHER): Payer: Medicare Other | Admitting: Podiatry

## 2015-07-07 VITALS — BP 159/91 | HR 108

## 2015-07-07 DIAGNOSIS — M25571 Pain in right ankle and joints of right foot: Secondary | ICD-10-CM

## 2015-07-07 DIAGNOSIS — M659 Synovitis and tenosynovitis, unspecified: Secondary | ICD-10-CM

## 2015-07-07 MED ORDER — OXYCODONE-ACETAMINOPHEN 10-325 MG PO TABS: 1.0000 | ORAL_TABLET | Freq: Three times a day (TID) | ORAL | Status: DC | PRN

## 2015-07-07 NOTE — Progress Notes (Signed)
Subjective: Follow up on right foot pain.  She is in school now. It will continue till March.  Having sharp shooting pain under the arch of right foot while walking along the long hallway at the school.  She is on flat bedroom slippers.   Objective: Sharp pain under the arch of right foot during ambulation. No edema noted. Positive of elevated first ray with forefoot varus right. Tight Achilles tendon bilateral.   Assessment: Unstable fusion site and pain with ambulation right. Short first Metatarsal bone right. Ankle Equinus bilateral.  Diabetic under control.  Plan: Reviewed stretch exercise daily. Advised to wear well supported tennis shoes and shoe inserts. Re order pain medication as requested. May require TAL on right.

## 2015-07-07 NOTE — Patient Instructions (Signed)
Right foot pain. Noted of tight Achilles tendon. Reviewed stretch exercise. Re ordered pain medication.

## 2015-07-08 DIAGNOSIS — F332 Major depressive disorder, recurrent severe without psychotic features: Secondary | ICD-10-CM | POA: Diagnosis not present

## 2015-07-14 ENCOUNTER — Ambulatory Visit: Payer: Medicare Other | Admitting: Podiatry

## 2015-07-18 DIAGNOSIS — E119 Type 2 diabetes mellitus without complications: Secondary | ICD-10-CM | POA: Diagnosis not present

## 2015-07-18 DIAGNOSIS — R1900 Intra-abdominal and pelvic swelling, mass and lump, unspecified site: Secondary | ICD-10-CM | POA: Diagnosis not present

## 2015-07-18 DIAGNOSIS — J45909 Unspecified asthma, uncomplicated: Secondary | ICD-10-CM | POA: Diagnosis not present

## 2015-07-18 DIAGNOSIS — I10 Essential (primary) hypertension: Secondary | ICD-10-CM | POA: Diagnosis not present

## 2015-07-18 DIAGNOSIS — J449 Chronic obstructive pulmonary disease, unspecified: Secondary | ICD-10-CM | POA: Diagnosis not present

## 2015-07-18 DIAGNOSIS — I119 Hypertensive heart disease without heart failure: Secondary | ICD-10-CM | POA: Diagnosis not present

## 2015-07-18 DIAGNOSIS — R300 Dysuria: Secondary | ICD-10-CM | POA: Diagnosis not present

## 2015-07-18 DIAGNOSIS — M19079 Primary osteoarthritis, unspecified ankle and foot: Secondary | ICD-10-CM | POA: Diagnosis not present

## 2015-07-18 DIAGNOSIS — G4733 Obstructive sleep apnea (adult) (pediatric): Secondary | ICD-10-CM | POA: Diagnosis not present

## 2015-07-21 ENCOUNTER — Other Ambulatory Visit: Payer: Self-pay | Admitting: Physician Assistant

## 2015-07-21 DIAGNOSIS — R1032 Left lower quadrant pain: Secondary | ICD-10-CM

## 2015-07-21 DIAGNOSIS — R1900 Intra-abdominal and pelvic swelling, mass and lump, unspecified site: Secondary | ICD-10-CM

## 2015-07-21 DIAGNOSIS — N920 Excessive and frequent menstruation with regular cycle: Secondary | ICD-10-CM

## 2015-07-21 DIAGNOSIS — R109 Unspecified abdominal pain: Secondary | ICD-10-CM

## 2015-07-25 ENCOUNTER — Encounter: Payer: Self-pay | Admitting: Podiatry

## 2015-07-25 ENCOUNTER — Ambulatory Visit (INDEPENDENT_AMBULATORY_CARE_PROVIDER_SITE_OTHER): Payer: Medicare Other | Admitting: Podiatry

## 2015-07-25 VITALS — BP 156/89 | HR 105

## 2015-07-25 DIAGNOSIS — R600 Localized edema: Secondary | ICD-10-CM | POA: Diagnosis not present

## 2015-07-25 DIAGNOSIS — M65979 Unspecified synovitis and tenosynovitis, unspecified ankle and foot: Secondary | ICD-10-CM

## 2015-07-25 DIAGNOSIS — M659 Synovitis and tenosynovitis, unspecified: Secondary | ICD-10-CM

## 2015-07-25 MED ORDER — OXYCODONE-ACETAMINOPHEN 10-325 MG PO TABS: 1.0000 | ORAL_TABLET | Freq: Three times a day (TID) | ORAL | Status: DC | PRN

## 2015-07-25 MED ORDER — NABUMETONE 500 MG PO TABS: 500.0000 mg | ORAL_TABLET | Freq: Two times a day (BID) | ORAL | Status: DC

## 2015-07-25 NOTE — Progress Notes (Signed)
Subjective: 45 year old female presents complaining of pain in right ankle. She was on feet cooking for the weekend ball game. Stated that her ankle got swollen and painful even with ankle brace. She was not able to wear tennis shoes as instructed. She is still on flat bedroom slippers.   Objective: Sharp pain under the right ankle over the weekend and unable to bear weight. No edema noted at the time of visit. Positive of elevated first ray with forefoot varus right. Tight Achilles tendon bilateral.   Assessment: Arthritic pain right ankle. Possible unstable fusion site and pain with ambulation right. Short first Metatarsal bone right. Ankle Equinus bilateral.  Diabetic under control.  Plan: Advised to wear ankle brace. Take Relafen one tablet q 12 hour. Take pain pills if needed. Return as needed.Marland Kitchen

## 2015-07-25 NOTE — Patient Instructions (Signed)
Seen for left foot pain. Having arthritic joint pain in right ankle. Stay off of feet and use ankle brace while on feet. Take Relafen for arthritic pain twice daily as needed. Pain pills for extreme pain.  Return as needed.

## 2015-07-26 ENCOUNTER — Ambulatory Visit
Admission: RE | Admit: 2015-07-26 | Discharge: 2015-07-26 | Disposition: A | Discharge status: Home / Self Care | Payer: Medicare Other | Source: Ambulatory Visit | Attending: Physician Assistant | Admitting: Physician Assistant

## 2015-07-26 ENCOUNTER — Other Ambulatory Visit: Payer: Self-pay | Admitting: Physician Assistant

## 2015-07-26 DIAGNOSIS — R1032 Left lower quadrant pain: Secondary | ICD-10-CM

## 2015-07-26 DIAGNOSIS — N920 Excessive and frequent menstruation with regular cycle: Secondary | ICD-10-CM

## 2015-07-26 DIAGNOSIS — R1900 Intra-abdominal and pelvic swelling, mass and lump, unspecified site: Secondary | ICD-10-CM

## 2015-07-28 DIAGNOSIS — F332 Major depressive disorder, recurrent severe without psychotic features: Secondary | ICD-10-CM | POA: Diagnosis not present

## 2015-07-29 DIAGNOSIS — I119 Hypertensive heart disease without heart failure: Secondary | ICD-10-CM | POA: Diagnosis not present

## 2015-08-02 ENCOUNTER — Other Ambulatory Visit: Payer: Medicare Other

## 2015-08-09 ENCOUNTER — Ambulatory Visit (INDEPENDENT_AMBULATORY_CARE_PROVIDER_SITE_OTHER): Payer: Medicare Other | Admitting: Podiatry

## 2015-08-09 ENCOUNTER — Encounter: Payer: Self-pay | Admitting: Podiatry

## 2015-08-09 DIAGNOSIS — M659 Synovitis and tenosynovitis, unspecified: Secondary | ICD-10-CM | POA: Diagnosis not present

## 2015-08-09 DIAGNOSIS — R262 Difficulty in walking, not elsewhere classified: Secondary | ICD-10-CM | POA: Diagnosis not present

## 2015-08-09 DIAGNOSIS — M65979 Unspecified synovitis and tenosynovitis, unspecified ankle and foot: Secondary | ICD-10-CM

## 2015-08-09 MED ORDER — OXYCODONE-ACETAMINOPHEN 10-325 MG PO TABS: 1.0000 | ORAL_TABLET | Freq: Three times a day (TID) | ORAL | Status: DC | PRN

## 2015-08-09 NOTE — Patient Instructions (Signed)
Pain in right foot. Continue try with tennis shoes and return as needed.

## 2015-08-09 NOTE — Progress Notes (Signed)
Subjective: 45 year old female presents complaining of pain in right foot.  Patient has gotten large tennis shoes and trying hard to keep them on. At school she walks long walk way to get to her classes.   Objective: Pain in right foot with ambulation.  Positive of elevated first ray with forefoot varus right. Tight Achilles tendon bilateral.   Assessment: Pain in right foot with difficulty walking.  Possible unstable fusion site and pain with ambulation right. Short first Metatarsal bone right. Ankle Equinus bilateral.  Diabetic under control.  Plan: Continue with tennis shoes. May take pain medication if needed. Return as needed.Diana Henderson

## 2015-08-12 DIAGNOSIS — F332 Major depressive disorder, recurrent severe without psychotic features: Secondary | ICD-10-CM | POA: Diagnosis not present

## 2015-08-15 DIAGNOSIS — M19079 Primary osteoarthritis, unspecified ankle and foot: Secondary | ICD-10-CM | POA: Diagnosis not present

## 2015-08-15 DIAGNOSIS — E119 Type 2 diabetes mellitus without complications: Secondary | ICD-10-CM | POA: Diagnosis not present

## 2015-08-15 DIAGNOSIS — I119 Hypertensive heart disease without heart failure: Secondary | ICD-10-CM | POA: Diagnosis not present

## 2015-08-15 DIAGNOSIS — I10 Essential (primary) hypertension: Secondary | ICD-10-CM | POA: Diagnosis not present

## 2015-08-15 DIAGNOSIS — J45909 Unspecified asthma, uncomplicated: Secondary | ICD-10-CM | POA: Diagnosis not present

## 2015-08-15 DIAGNOSIS — J449 Chronic obstructive pulmonary disease, unspecified: Secondary | ICD-10-CM | POA: Diagnosis not present

## 2015-08-15 DIAGNOSIS — G4733 Obstructive sleep apnea (adult) (pediatric): Secondary | ICD-10-CM | POA: Diagnosis not present

## 2015-08-15 DIAGNOSIS — E669 Obesity, unspecified: Secondary | ICD-10-CM | POA: Diagnosis not present

## 2015-08-17 ENCOUNTER — Emergency Department (HOSPITAL_COMMUNITY): Payer: Medicare Other

## 2015-08-17 ENCOUNTER — Encounter (HOSPITAL_COMMUNITY): Payer: Self-pay | Admitting: Family Medicine

## 2015-08-17 ENCOUNTER — Emergency Department (HOSPITAL_COMMUNITY)
Admission: EM | Admit: 2015-08-17 | Discharge: 2015-08-17 | Disposition: A | Discharge status: Home / Self Care | Payer: Medicare Other | Attending: Emergency Medicine | Admitting: Emergency Medicine

## 2015-08-17 DIAGNOSIS — F419 Anxiety disorder, unspecified: Secondary | ICD-10-CM | POA: Insufficient documentation

## 2015-08-17 DIAGNOSIS — F319 Bipolar disorder, unspecified: Secondary | ICD-10-CM | POA: Insufficient documentation

## 2015-08-17 DIAGNOSIS — G8929 Other chronic pain: Secondary | ICD-10-CM | POA: Insufficient documentation

## 2015-08-17 DIAGNOSIS — Z7982 Long term (current) use of aspirin: Secondary | ICD-10-CM | POA: Insufficient documentation

## 2015-08-17 DIAGNOSIS — Z79899 Other long term (current) drug therapy: Secondary | ICD-10-CM | POA: Diagnosis not present

## 2015-08-17 DIAGNOSIS — Z9981 Dependence on supplemental oxygen: Secondary | ICD-10-CM | POA: Diagnosis not present

## 2015-08-17 DIAGNOSIS — Z794 Long term (current) use of insulin: Secondary | ICD-10-CM | POA: Insufficient documentation

## 2015-08-17 DIAGNOSIS — M4322 Fusion of spine, cervical region: Secondary | ICD-10-CM | POA: Diagnosis not present

## 2015-08-17 DIAGNOSIS — R202 Paresthesia of skin: Secondary | ICD-10-CM | POA: Diagnosis not present

## 2015-08-17 DIAGNOSIS — K219 Gastro-esophageal reflux disease without esophagitis: Secondary | ICD-10-CM | POA: Insufficient documentation

## 2015-08-17 DIAGNOSIS — R200 Anesthesia of skin: Secondary | ICD-10-CM | POA: Insufficient documentation

## 2015-08-17 DIAGNOSIS — R531 Weakness: Secondary | ICD-10-CM | POA: Diagnosis not present

## 2015-08-17 DIAGNOSIS — Z7984 Long term (current) use of oral hypoglycemic drugs: Secondary | ICD-10-CM | POA: Insufficient documentation

## 2015-08-17 DIAGNOSIS — Z87891 Personal history of nicotine dependence: Secondary | ICD-10-CM | POA: Insufficient documentation

## 2015-08-17 DIAGNOSIS — R079 Chest pain, unspecified: Secondary | ICD-10-CM | POA: Diagnosis not present

## 2015-08-17 DIAGNOSIS — M1990 Unspecified osteoarthritis, unspecified site: Secondary | ICD-10-CM | POA: Diagnosis not present

## 2015-08-17 DIAGNOSIS — M5412 Radiculopathy, cervical region: Secondary | ICD-10-CM | POA: Diagnosis not present

## 2015-08-17 DIAGNOSIS — F4310 Post-traumatic stress disorder, unspecified: Secondary | ICD-10-CM | POA: Diagnosis not present

## 2015-08-17 DIAGNOSIS — J45909 Unspecified asthma, uncomplicated: Secondary | ICD-10-CM | POA: Diagnosis not present

## 2015-08-17 DIAGNOSIS — E119 Type 2 diabetes mellitus without complications: Secondary | ICD-10-CM | POA: Diagnosis not present

## 2015-08-17 DIAGNOSIS — Z8701 Personal history of pneumonia (recurrent): Secondary | ICD-10-CM | POA: Diagnosis not present

## 2015-08-17 DIAGNOSIS — Z91040 Latex allergy status: Secondary | ICD-10-CM | POA: Insufficient documentation

## 2015-08-17 DIAGNOSIS — Z7952 Long term (current) use of systemic steroids: Secondary | ICD-10-CM | POA: Insufficient documentation

## 2015-08-17 DIAGNOSIS — I10 Essential (primary) hypertension: Secondary | ICD-10-CM | POA: Diagnosis not present

## 2015-08-17 DIAGNOSIS — G43909 Migraine, unspecified, not intractable, without status migrainosus: Secondary | ICD-10-CM | POA: Insufficient documentation

## 2015-08-17 DIAGNOSIS — R42 Dizziness and giddiness: Secondary | ICD-10-CM | POA: Diagnosis not present

## 2015-08-17 DIAGNOSIS — R209 Unspecified disturbances of skin sensation: Secondary | ICD-10-CM | POA: Diagnosis not present

## 2015-08-17 DIAGNOSIS — Z791 Long term (current) use of non-steroidal anti-inflammatories (NSAID): Secondary | ICD-10-CM | POA: Insufficient documentation

## 2015-08-17 DIAGNOSIS — Z7951 Long term (current) use of inhaled steroids: Secondary | ICD-10-CM | POA: Diagnosis not present

## 2015-08-17 DIAGNOSIS — M79602 Pain in left arm: Secondary | ICD-10-CM | POA: Diagnosis present

## 2015-08-17 LAB — BASIC METABOLIC PANEL
ANION GAP: 11 (ref 5–15)
BUN: 6 mg/dL (ref 6–20)
CALCIUM: 9.5 mg/dL (ref 8.9–10.3)
CHLORIDE: 99 mmol/L — AB (ref 101–111)
CO2: 25 mmol/L (ref 22–32)
CREATININE: 0.94 mg/dL (ref 0.44–1.00)
GFR calc Af Amer: >60 mL/min (ref >60)
GFR calc non Af Amer: >60 mL/min (ref >60)
GLUCOSE: 274 mg/dL — AB (ref 65–99)
Potassium: 4.1 mmol/L (ref 3.5–5.1)
SODIUM: 135 mmol/L (ref 135–145)

## 2015-08-17 LAB — CBC
HCT: 43.3 % (ref 36.0–46.0)
Hemoglobin: 13.8 g/dL (ref 12.0–15.0)
MCH: 26.7 pg (ref 26.0–34.0)
MCHC: 31.9 g/dL (ref 30.0–36.0)
MCV: 83.8 fL (ref 78.0–100.0)
Platelets: 162 10*3/uL (ref 150–400)
RBC: 5.17 MIL/uL — ABNORMAL HIGH (ref 3.87–5.11)
RDW: 12.6 % (ref 11.5–15.5)
WBC: 6 10*3/uL (ref 4.0–10.5)

## 2015-08-17 LAB — I-STAT TROPONIN, ED
TROPONIN I, POC: 0 ng/mL (ref 0.00–0.08)
Troponin i, poc: 0 ng/mL (ref 0.00–0.08)

## 2015-08-17 MED ORDER — HYDROMORPHONE HCL 1 MG/ML IJ SOLN
1.0000 mg | Freq: Once | INTRAMUSCULAR | Status: DC
  Filled 2015-08-17: qty 1

## 2015-08-17 MED ORDER — OXYCODONE-ACETAMINOPHEN 5-325 MG PO TABS: 2.0000 | ORAL_TABLET | ORAL | Status: DC | PRN

## 2015-08-17 MED ORDER — DIAZEPAM 5 MG/ML IJ SOLN
5.0000 mg | Freq: Once | INTRAMUSCULAR | Status: AC
  Administered 2015-08-17: 5 mg via INTRAMUSCULAR
  Filled 2015-08-17: qty 2

## 2015-08-17 MED ORDER — HYDROMORPHONE HCL 1 MG/ML IJ SOLN
1.0000 mg | Freq: Once | INTRAMUSCULAR | Status: AC
  Administered 2015-08-17: 1 mg via INTRAMUSCULAR
  Filled 2015-08-17: qty 1

## 2015-08-17 MED ORDER — PREDNISONE 50 MG PO TABS: ORAL_TABLET | ORAL | Status: DC

## 2015-08-17 NOTE — ED Notes (Signed)
Pt here for left arm pain with movement and radiating into shoulder and chest. sts x 1 week.

## 2015-08-17 NOTE — Discharge Instructions (Signed)
Take motrin for pain.  Take prednisone as prescribed as it may help your numbness.   Take percocet for severe pain.   Call Dr. Kathyrn Sheriff tomorrow to get follow up.   You are likely going to have some numbness.   Return to ER if you have worse numbness and weakness, trouble walking, chest pain, shortness of breath.

## 2015-08-17 NOTE — ED Notes (Signed)
Patient transported to MRI 

## 2015-08-17 NOTE — ED Provider Notes (Addendum)
CSN: [id number]     Arrival date & time 08/17/15  [id number] History   First MD Initiated Contact with Patient 08/17/15 [phone]     Chief Complaint  Patient presents with  . Arm Pain  . Chest Pain     (Consider location/radiation/quality/duration/timing/severity/associated sxs/prior Treatment) The history is provided by the patient.  Diana Henderson is a 45 y.o. 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 Patient states that since her surgery about a year ago, she's been having intermittent left arm numbness. She states over the last week, her left arm has become more numb as well as associated with some weakness. Patient states that the pain does radiate to the left upper chest. Denies any shortness of breath or diaphoresis. Patient also has some left leg numbness over the last week or so as well. Denies any trouble speaking. Taking percocet with minimal relief.    Past Medical History  Diagnosis Date  . Diabetes mellitus   . Hypertension   . Hyperlipidemia   . Depression   . PTSD (post-traumatic stress disorder)   . Pain in joint, ankle and foot 02/17/2013  . Anxiety   . Bipolar 1 disorder ([place])   . Sleep apnea     uses CPAP intermittently, pt. doesn't remember where she had the study  . Asthma     has been eval. by Dr. Gwenette Greet- last 06/2014  . Pneumonia     hosp. for a couple of day- not sure when it was   . GERD (gastroesophageal reflux disease)   . Headache     sometimes has migraines   . Arthritis     osteoarthritis   . OSA on CPAP   . Knee pain, chronic    Past Surgical History  Procedure Laterality Date  . Tubal ligation    . Midtarsal arthrodesis Right 07/17/12    Rt foot  . Remove implant deep Right 07/17/12    Rt foot  . Lapidus fusion Right 01/10/12    Rt foot  . Carpal tunnel release Left 2001    Left wrist  . Gastroc recession extremity  Right 3//20/14    Right foot  . Hernia repair      1996  . Remove implant deep Right 07/22/2014    Rt foot @ [facility]  . Cotton osteotomy w/ bone graft Right 09/30/2014    Rt foot @ [facility]  . Anterior cervical decomp/discectomy fusion N/A 11/05/2014    Procedure: ANTERIOR CERVICAL DECOMPRESSION/DISCECTOMY FUSION 1 LEVEL;  Surgeon: Consuella Lose, MD;  Location: [facility];  Service: Neurosurgery;  Laterality: N/A;  Cervical five- cervical seven anterior cervical decompression with fusion interbody prosthesis plating and bonegraft   Family History  Problem Relation Age of Onset  . Heart attack Father   . Kidney disease Mother    Social History  Substance Use Topics  . Smoking status: Former Smoker -- 2.00 packs/day for 6 years    Types: Cigarettes    Quit date: 06/18/1978  . Smokeless tobacco: Never Used  . Alcohol Use: 0.0 oz/week    0 Standard drinks or equivalent per week     Comment: weekend- use of beer    OB History    No data available     Review of Systems  Cardiovascular: Positive for chest pain.  All other systems reviewed and are negative.  Allergies  Celery oil; Pork-derived products; and Latex  Home Medications   Prior to Admission medications   Medication Sig Start Date End Date Taking? Authorizing Provider  albuterol (PROVENTIL HFA;VENTOLIN HFA) 108 (90 BASE) MCG/ACT inhaler Inhale 1-2 puffs into the lungs every 6 (six) hours as needed for wheezing or shortness of breath. 04/07/15   Nona Dell, PA-C  amLODipine (NORVASC) 10 MG tablet Take 1 tablet (10 mg total) by mouth daily. 09/10/14   Eugenie Filler, MD  aspirin 81 MG tablet Take 81 mg by mouth daily.    Historical Provider, MD  atenolol (TENORMIN) 50 MG tablet Take 1 tablet (50 mg total) by mouth daily. 09/10/14   Eugenie Filler, MD  beclomethasone (QVAR) 80 MCG/ACT inhaler Inhale 1 puff into the lungs 2 (two) times daily.    Historical Provider, MD  budesonide-formoterol (SYMBICORT)  160-4.5 MCG/ACT inhaler Inhale 2 puffs into the lungs 2 (two) times daily.    Historical Provider, MD  clotrimazole-betamethasone (LOTRISONE) cream Apply 1 application topically 2 (two) times daily. 05/25/15   Myeong O Sheard, DPM  DULoxetine (CYMBALTA) 60 MG capsule Take 60 mg by mouth at bedtime.     Historical Provider, MD  fluticasone (FLONASE) 50 MCG/ACT nasal spray Place 1 spray into both nostrils 2 (two) times daily. 04/07/15   Nona Dell, PA-C  furosemide (LASIX) 20 MG tablet Take 1 tablet (20 mg total) by mouth daily. 04/07/15 04/21/15  Rigoberto Noel, MD  gabapentin (NEURONTIN) 600 MG tablet Take 600 mg by mouth 3 (three) times daily.    Historical Provider, MD  hydrOXYzine (ATARAX/VISTARIL) 50 MG tablet Take 50 mg by mouth 3 (three) times daily as needed for anxiety.  10/22/14   Historical Provider, MD  insulin NPH-regular Human (NOVOLIN 70/30) (70-30) 100 UNIT/ML injection Inject 30 Units into the skin 2 (two) times daily with a meal.    Historical Provider, MD  INVOKANA 300 MG TABS tablet Take 300 mg by mouth daily. 09/18/14   Historical Provider, MD  JANUMET XR 50-1000 MG TB24 Take 1 tablet by mouth daily. 09/18/14   Historical Provider, MD  lactulose (CHRONULAC) 10 GM/15ML solution Take 45 mLs (30 g total) by mouth 3 (three) times daily. 09/10/14   Eugenie Filler, MD  mometasone (NASONEX) 50 MCG/ACT nasal spray Place 2 sprays into the nose at bedtime. 04/07/15   Rigoberto Noel, MD  Multiple Vitamin (MULTIVITAMIN) tablet Take 1 tablet by mouth daily.    Historical Provider, MD  nabumetone (RELAFEN) 500 MG tablet Take 1 tablet (500 mg total) by mouth 2 (two) times daily. 07/25/15   Myeong O Sheard, DPM  NOVOLOG MIX 70/30 (70-30) 100 UNIT/ML injection Inject 25 Units into the skin 2 (two) times daily. 09/18/14   Historical Provider, MD  olmesartan (BENICAR) 20 MG tablet Take 20 mg by mouth daily.    Historical Provider, MD  omeprazole (PRILOSEC) 20 MG capsule Take 20 mg by mouth daily.      Historical Provider, MD  ondansetron (ZOFRAN ODT) 8 MG disintegrating tablet Take 1 tablet (8 mg total) by mouth every 8 (eight) hours as needed for nausea or vomiting. 01/28/14   Sherwood Gambler, MD  oxyCODONE-acetaminophen (PERCOCET) 10-325 MG tablet Take 1 tablet by mouth every 8 (eight) hours as needed for pain. 08/09/15   Myeong O Sheard, DPM  Psyllium (METAMUCIL PO) Take by mouth.    Historical Provider, MD  risperiDONE (RISPERDAL) 0.5 MG tablet Take 0.5-1 mg by mouth 2 (  two) times daily. Take 0.5 mg in the morning and take 1 mg in the evening 10/22/14   Historical Provider, MD  tiZANidine (ZANAFLEX) 4 MG tablet Take 1 tablet (4 mg total) by mouth every 8 (eight) hours as needed for muscle spasms. 09/10/14   Eugenie Filler, MD  trimethoprim-polymyxin b Mayra Neer) ophthalmic solution Place 1 drop into the left eye every 4 (four) hours. 02/19/14   Donne Hazel, MD   BP 132/80 mmHg  Pulse 99  Temp(Src) 97.9 F (36.6 C) (Oral)  Resp 16  SpO2 93% Physical Exam  Constitutional: She is oriented to person, place, and time.  Uncomfortable   HENT:  Head: Normocephalic.  Mouth/Throat: Oropharynx is clear and moist.  Eyes: Conjunctivae are normal. Pupils are equal, round, and reactive to light.  Neck:  Dec ROM of the neck, mild L paracervical tenderness   Cardiovascular: Normal rate, regular rhythm and normal heart sounds.   Pulmonary/Chest: Effort normal and breath sounds normal. No respiratory distress. She has no wheezes. She has no rales.  Abdominal: Soft. Bowel sounds are normal. She exhibits no distension. There is no tenderness. There is no rebound.  Musculoskeletal: Normal range of motion.  Neurological: She is alert and oriented to person, place, and time.  CN 2-12 intact. Dec sensation L arm and leg. Dec L hand grasp (not sure if from pain or not) and L arm flexion. Nl strength L leg and right side   Skin: Skin is warm.  Psychiatric: She has a normal mood and affect. Her behavior is  normal. Thought content normal.  Nursing note and vitals reviewed.   ED Course  Procedures (including critical care time) Labs Review Labs Reviewed  BASIC METABOLIC PANEL - Abnormal; Notable for the following:    Chloride 99 (*)    Glucose, Bld 274 (*)    All other components within normal limits  CBC - Abnormal; Notable for the following:    RBC 5.17 (*)    All other components within normal limits  I-STAT TROPOININ, ED  Randolm Idol, ED    Imaging Review Dg Chest 2 View  08/17/2015  CLINICAL DATA:  Chest pain for several days EXAM: CHEST  2 VIEW COMPARISON:  April 07, 2015 FINDINGS: Lungs are clear. Heart size and pulmonary vascularity are normal. No adenopathy. No pneumothorax. There is degenerative change in the thoracic spine. There is postoperative change in the lower cervical region. IMPRESSION: No edema or consolidation. Electronically Signed   By: Lowella Grip III M.D.   On: 08/17/2015 09:47   Ct Head Wo Contrast  08/17/2015  CLINICAL DATA:  Dizziness, left arm weakness x1 week EXAM: CT HEAD WITHOUT CONTRAST CT CERVICAL SPINE WITHOUT CONTRAST TECHNIQUE: Multidetector CT imaging of the head and cervical spine was performed following the standard protocol without intravenous contrast. Multiplanar CT image reconstructions of the cervical spine were also generated. COMPARISON:  CT head dated 09/04/2014 left FINDINGS: CT HEAD FINDINGS No evidence of parenchymal hemorrhage or extra-axial fluid collection. No mass lesion, mass effect, or midline shift. No CT evidence of acute infarction. Cerebral volume is within normal limits.  No ventriculomegaly. The visualized paranasal sinuses are essentially clear. The mastoid air cells are unopacified. No evidence of calvarial fracture. CT CERVICAL SPINE FINDINGS Reversal of the normal cervical lordosis. No evidence of fracture or dislocation. Vertebral body heights are maintained. Dens appears intact. Mild multilevel degenerative changes.  Status post ACDF at C5-6. Visualized thyroid is unremarkable. Visualized lung apices are clear. IMPRESSION:  Normal head CT. No evidence of traumatic injury to the cervical spine. Status post ACDF at C5-6.  Mild degenerative changes. Electronically Signed   By: Julian Hy M.D.   On: 08/17/2015 11:25   Ct Cervical Spine Wo Contrast  08/17/2015  CLINICAL DATA:  Dizziness, left arm weakness x1 week EXAM: CT HEAD WITHOUT CONTRAST CT CERVICAL SPINE WITHOUT CONTRAST TECHNIQUE: Multidetector CT imaging of the head and cervical spine was performed following the standard protocol without intravenous contrast. Multiplanar CT image reconstructions of the cervical spine were also generated. COMPARISON:  CT head dated 09/04/2014 left FINDINGS: CT HEAD FINDINGS No evidence of parenchymal hemorrhage or extra-axial fluid collection. No mass lesion, mass effect, or midline shift. No CT evidence of acute infarction. Cerebral volume is within normal limits.  No ventriculomegaly. The visualized paranasal sinuses are essentially clear. The mastoid air cells are unopacified. No evidence of calvarial fracture. CT CERVICAL SPINE FINDINGS Reversal of the normal cervical lordosis. No evidence of fracture or dislocation. Vertebral body heights are maintained. Dens appears intact. Mild multilevel degenerative changes. Status post ACDF at C5-6. Visualized thyroid is unremarkable. Visualized lung apices are clear. IMPRESSION: Normal head CT. No evidence of traumatic injury to the cervical spine. Status post ACDF at C5-6.  Mild degenerative changes. Electronically Signed   By: Julian Hy M.D.   On: 08/17/2015 11:25   Mr Brain Wo Contrast  08/17/2015  CLINICAL DATA:  Left arm pain with movement extending into the shoulder and chest. Left-sided weakness and numbness. EXAM: MRI HEAD WITHOUT CONTRAST TECHNIQUE: Multiplanar, multiecho pulse sequences of the brain and surrounding structures were obtained without intravenous contrast.  COMPARISON:  CT head without contrast from the same day. MRI brain 09/04/2014. FINDINGS: The study is mildly degraded by patient motion. No acute infarct, hemorrhage, or mass lesion is present. The ventricles are of normal size. No significant extraaxial fluid collection is present. No significant white matter disease is present. The internal auditory canals are within normal limits. The brainstem and cerebellum are normal. Flow is present in the major intracranial arteries. The globes and orbits are intact. The paranasal sinuses and mastoid air cells are clear. The skullbase is within normal limits. Midline sagittal images demonstrate moderate prominence of the adenoid tissue. No other focal lesions are present. IMPRESSION: 1. Normal MRI appearance of the brain. 2. Moderate prominence of the adenoid tissue. Question recent upper respiratory infection. Electronically Signed   By: San Morelle M.D.   On: 08/17/2015 16:23   Mr Cervical Spine Wo Contrast  08/17/2015  CLINICAL DATA:  Left arm pain with movement.  Left-sided weakness. EXAM: MRI CERVICAL SPINE WITHOUT CONTRAST TECHNIQUE: Multiplanar, multisequence MR imaging of the cervical spine was performed. No intravenous contrast was administered. COMPARISON:  CT of the cervical spine from the same day. MRI of the cervical spine 06/17/2014. FINDINGS: Normal signal is present in the cervical and upper thoracic spinal cord to the lowest imaged level, T1-2. Anterior fusion is now present at C5-6. Craniocervical junction is within normal limits. The visualized intracranial contents are normal. Flow is present in the vertebral arteries bilaterally. C2-3:  Negative. C3-4: Asymmetric left-sided uncovertebral and facet hypertrophy results and progression of mild left foraminal stenosis. C4-5: Asymmetric left-sided facet hypertrophy is present. There is no significant stenosis. C5-6: A residual right paramedian bone spur or ossification of the posterior longitudinal  ligament contacts and distorts the right ventral surface of the cord. A canal is narrowed to 5.5 mm, improved from the prior study. There  is progression of moderate foraminal narrowing bilaterally, left greater than right. C6-7: A mild rightward disc protrusion is present without significant stenosis. C7-T1:  Negative. IMPRESSION: 1. Interval anterior fusion at C5-6. 2. Residual right paramedian bone spur or ossification of posterior longitudinal ligament contacting and distorting right paramedian cord. The canal is narrowed to 5.5 mm, slightly improved from the prior exam. 3. Progressive moderate foraminal narrowing bilaterally at C5-6, left greater than right. 4. Progression of mild left foraminal narrowing at C3-4. 5. Asymmetric left-sided facet hypertrophy at C4-5 without significant stenosis. Electronically Signed   By: San Morelle M.D.   On: 08/17/2015 16:38   I have personally reviewed and evaluated these images and lab results as part of my medical decision-making.   EKG Interpretation   Date/Time:  Wednesday August 17 2015 08:41:41 EST Ventricular Rate:  86 PR Interval:  144 QRS Duration: 74 QT Interval:  382 QTC Calculation: 457 R Axis:   58 Text Interpretation:  Normal sinus rhythm Low voltage QRS Borderline ECG  No significant change since last tracing Confirmed by Orville Widmann  MD, Angelena Sand  ([id number]) on 08/17/2015 9:19:21 AM      MDM   Final diagnoses:  None   Lynnzee Stanislaw is a 45 y.o. female here with L arm pain and numbness, chest pain. Consider ACS so will get trop x 2 . More likely to have cervical spine compression vs radiculopathy, less likely stroke. Will try CT head/neck and give pain meds and reassess.   11:40 AM Still had pain and still slightly weak on L side. CT head/neck unremarkable. Will get MRI brain/cervical.   4:49 PM Trop neg x 2. Labs unremarkable. MRI brain unremarkable. MRI cervical spine showed no cord compression but has more foraminal narrowing on  the left. After pain meds, pain improved. Numbness and weakness of the arm and legs improved. I will dc her home with pain meds and steroids and have her call Dr. Ralene Ok to get follow up. Consider steroid injections or other interventions outpatient.   Wandra Arthurs, MD 08/17/15 [place] Naod Sweetland, MD 08/17/15 [phone]

## 2015-08-17 NOTE — ED Notes (Signed)
No weakness, no neuro deficits. Pt was resting after MRI

## 2015-08-22 ENCOUNTER — Encounter (HOSPITAL_COMMUNITY): Payer: Self-pay | Admitting: Emergency Medicine

## 2015-08-22 ENCOUNTER — Emergency Department (HOSPITAL_COMMUNITY)
Admission: EM | Admit: 2015-08-22 | Discharge: 2015-08-22 | Disposition: A | Discharge status: Home / Self Care | Payer: Medicare Other | Attending: Emergency Medicine | Admitting: Emergency Medicine

## 2015-08-22 DIAGNOSIS — R112 Nausea with vomiting, unspecified: Secondary | ICD-10-CM | POA: Diagnosis not present

## 2015-08-22 DIAGNOSIS — G8929 Other chronic pain: Secondary | ICD-10-CM | POA: Insufficient documentation

## 2015-08-22 DIAGNOSIS — M79605 Pain in left leg: Secondary | ICD-10-CM | POA: Diagnosis not present

## 2015-08-22 DIAGNOSIS — J45909 Unspecified asthma, uncomplicated: Secondary | ICD-10-CM | POA: Insufficient documentation

## 2015-08-22 DIAGNOSIS — E119 Type 2 diabetes mellitus without complications: Secondary | ICD-10-CM | POA: Diagnosis not present

## 2015-08-22 DIAGNOSIS — R197 Diarrhea, unspecified: Secondary | ICD-10-CM | POA: Diagnosis not present

## 2015-08-22 DIAGNOSIS — I10 Essential (primary) hypertension: Secondary | ICD-10-CM | POA: Insufficient documentation

## 2015-08-22 NOTE — ED Notes (Signed)
Patient presents for left leg pain x3 weeks. Patient states she recently had some of her prescriptions refill and has been having N/V/D today. Reports 3 episodes of emesis, and 5+ episodes of diarrhea.

## 2015-08-29 ENCOUNTER — Encounter: Payer: Self-pay | Admitting: Podiatry

## 2015-08-29 ENCOUNTER — Other Ambulatory Visit: Payer: Self-pay

## 2015-08-29 ENCOUNTER — Inpatient Hospital Stay: Admission: RE | Admit: 2015-08-29 | Payer: Self-pay | Source: Ambulatory Visit

## 2015-08-29 ENCOUNTER — Ambulatory Visit (INDEPENDENT_AMBULATORY_CARE_PROVIDER_SITE_OTHER): Payer: Medicare Other | Admitting: Podiatry

## 2015-08-29 DIAGNOSIS — E119 Type 2 diabetes mellitus without complications: Secondary | ICD-10-CM | POA: Diagnosis not present

## 2015-08-29 DIAGNOSIS — M19079 Primary osteoarthritis, unspecified ankle and foot: Secondary | ICD-10-CM | POA: Diagnosis not present

## 2015-08-29 DIAGNOSIS — G4733 Obstructive sleep apnea (adult) (pediatric): Secondary | ICD-10-CM | POA: Diagnosis not present

## 2015-08-29 DIAGNOSIS — E669 Obesity, unspecified: Secondary | ICD-10-CM | POA: Diagnosis not present

## 2015-08-29 DIAGNOSIS — M21961 Unspecified acquired deformity of right lower leg: Secondary | ICD-10-CM | POA: Diagnosis not present

## 2015-08-29 DIAGNOSIS — R262 Difficulty in walking, not elsewhere classified: Secondary | ICD-10-CM

## 2015-08-29 DIAGNOSIS — I119 Hypertensive heart disease without heart failure: Secondary | ICD-10-CM | POA: Diagnosis not present

## 2015-08-29 DIAGNOSIS — I10 Essential (primary) hypertension: Secondary | ICD-10-CM | POA: Diagnosis not present

## 2015-08-29 DIAGNOSIS — G629 Polyneuropathy, unspecified: Secondary | ICD-10-CM | POA: Diagnosis not present

## 2015-08-29 DIAGNOSIS — M659 Synovitis and tenosynovitis, unspecified: Secondary | ICD-10-CM

## 2015-08-29 DIAGNOSIS — J45909 Unspecified asthma, uncomplicated: Secondary | ICD-10-CM | POA: Diagnosis not present

## 2015-08-29 DIAGNOSIS — J449 Chronic obstructive pulmonary disease, unspecified: Secondary | ICD-10-CM | POA: Diagnosis not present

## 2015-08-29 MED ORDER — OXYCODONE-ACETAMINOPHEN 10-325 MG PO TABS: 1.0000 | ORAL_TABLET | Freq: Three times a day (TID) | ORAL | Status: DC | PRN

## 2015-08-29 NOTE — Patient Instructions (Signed)
Pain in foot and ankle bilateral with difficulty walking. Discussed the nature of problem. Will provide paper work that are necessary for her to get [place] around equipment.

## 2015-08-29 NOTE — Progress Notes (Signed)
Subjective: 45 year old female presents assisted by a cane stating that her feet are so painful walking down the campus and making her upper legs hurt and give out.  Patient discussed her pain with her PCP who advised her to get an equipment called "huber around". Stated that this pain is new, her whole legs are weak and tries to give out. This started about 10 days ago and is not stopping.  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 Pain starts on top and around ankles and goes up to leg.  The pain is about the same on right and left foot.  Patient still wears large tennis shoes.  Objective: Pain in both dorsum and ankle with ambulation bilateral.  Pain extending upper thigh bilateral. Difficulty walking.  Positive of elevated first ray with forefoot varus right. Tight Achilles tendon bilateral.   Radiographic examination reveal: Right foot has positive of bone callus around fusion site of 1st MCJ, irregular joint space at Naviculocuneiform joint and ankle joint right foot with elevated first metatarsal bone. Old fusion site at first [place] left appears to be fused with bone callus. Left foot show in lateral view elevated first ray, normal and congruous joint surfaces and spaces at TNJ, and ankle joint.  Impressions: Right foot and ankle pain due to osteoarthropathy of midfoot and ankle joint. Left foot pain from faulty biomechanics.   Assessment: Pain in foot and ankle radiating to thigh with difficulty walking.  Osteoarthropathy right foot. Faulty biomechanics left foot.  Ankle Equinus bilateral.  Diabetic under control.  Plan: Continue with tennis shoes. May take pain medication if needed. Will check on medial equipment stores to order the equipment for her.  Patient will check on stores for availability of "Charisse March around".

## 2015-09-01 ENCOUNTER — Other Ambulatory Visit: Payer: Self-pay | Admitting: Neurosurgery

## 2015-09-01 DIAGNOSIS — M4712 Other spondylosis with myelopathy, cervical region: Secondary | ICD-10-CM | POA: Diagnosis not present

## 2015-09-01 DIAGNOSIS — Z6841 Body Mass Index (BMI) 40.0 and over, adult: Secondary | ICD-10-CM | POA: Diagnosis not present

## 2015-09-01 DIAGNOSIS — I10 Essential (primary) hypertension: Secondary | ICD-10-CM | POA: Diagnosis not present

## 2015-09-07 DIAGNOSIS — F332 Major depressive disorder, recurrent severe without psychotic features: Secondary | ICD-10-CM | POA: Diagnosis not present

## 2015-09-12 ENCOUNTER — Encounter: Payer: Self-pay | Admitting: Podiatry

## 2015-09-12 ENCOUNTER — Ambulatory Visit (INDEPENDENT_AMBULATORY_CARE_PROVIDER_SITE_OTHER): Payer: Medicare Other | Admitting: Podiatry

## 2015-09-12 VITALS — BP 159/95 | HR 109

## 2015-09-12 DIAGNOSIS — M659 Synovitis and tenosynovitis, unspecified: Secondary | ICD-10-CM | POA: Diagnosis not present

## 2015-09-12 DIAGNOSIS — R262 Difficulty in walking, not elsewhere classified: Secondary | ICD-10-CM | POA: Diagnosis not present

## 2015-09-12 DIAGNOSIS — M25579 Pain in unspecified ankle and joints of unspecified foot: Secondary | ICD-10-CM | POA: Diagnosis not present

## 2015-09-12 DIAGNOSIS — R600 Localized edema: Secondary | ICD-10-CM

## 2015-09-12 NOTE — Patient Instructions (Signed)
Seen for mobility evaluation for motorized wheel chair. Will process paper.

## 2015-09-12 NOTE — Progress Notes (Signed)
Subjective: 45 year old female presents for mobility evaluation to assess eligibility for motorized wheel chair.  Patient is having problem of prolonged standing and walking due to pain and swelling of feet and ankles for the past couple of years. A few weeks ago her lower limbs start give out and mader her to collapse down to floor while she was in school even with using cane.  Patient is hoping that the power chair would help her finish her school, which involves much of walking back and forth classroom to class room. It would also help her at home with cooking and cleaning.  She has a pinched nerve and weak left arm and hand since had a neck surgery in 2016. She is scheduled to have another nerve surgery in her left arm on 09/16/2015. Stated that her hand has strength of 1/5 and associated pain level is 8 in scale of 10. She is unable to use walker and barely uses cane with her right hand and gets quickly exhausted.  Patient is not certain scooter could be an option in her apartment setting.  Patient is mentally sound. 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 She is hoping to be able finish currently enrolled classes. Her foot would require operation this summer and would be a great help is she can get around with a power chair while recovering.  The power chair would also help her to take care of her household chores such as cleaning, cooking, and personal care.   Objective: Dermatologic: Normal findings. Vascular status are within normal. Neurologic: Pain in both dorsum and ankle with ambulation bilateral. Pain extending upper thigh bilateral.  Recently collapsing and tripping down to floor a couple times a day.  Experiencing daily walking difficulties.  Orthopedic: Positive of elevated first ray with forefoot varus right. Tight Achilles tendon bilateral.   Radiographic examination reveal: Right foot has positive of bone callus around fusion site of 1st MCJ,  irregular joint space at Naviculocuneiform joint and ankle joint right foot with elevated first metatarsal bone. Old fusion site at first [place] left appears to be fused with bone callus. Left foot show in lateral view elevated first ray, normal and congruous joint surfaces and spaces at TNJ, and ankle joint.  Impressions: Right foot and ankle pain due to osteoarthropathy of midfoot and ankle joint. Left foot pain from faulty biomechanics.   Assessment: 1. Pain and swelling in foot and ankle radiating to thigh. 2. Difficulty walking and frequent fall.  3. Osteoarthropathy right foot and ankle. 4. Faulty biomechanics bilateral.  5. Ankle Equinus bilateral.  6. Diabetic under control. 7. Weakness left arm. 8. Chronic knee pain.  Plan: Stay in tennis shoes. May take pain medication if needed. May benefit from motorized wheel chair for the next 18-24 months. Marland Kitchen

## 2015-09-13 ENCOUNTER — Encounter (HOSPITAL_COMMUNITY)
Admission: RE | Admit: 2015-09-13 | Discharge: 2015-09-13 | Disposition: A | Discharge status: Home / Self Care | Payer: Medicare Other | Source: Ambulatory Visit | Attending: Neurosurgery | Admitting: Neurosurgery

## 2015-09-13 ENCOUNTER — Encounter (HOSPITAL_COMMUNITY): Payer: Self-pay

## 2015-09-13 DIAGNOSIS — E785 Hyperlipidemia, unspecified: Secondary | ICD-10-CM

## 2015-09-13 DIAGNOSIS — E119 Type 2 diabetes mellitus without complications: Secondary | ICD-10-CM | POA: Insufficient documentation

## 2015-09-13 DIAGNOSIS — G4733 Obstructive sleep apnea (adult) (pediatric): Secondary | ICD-10-CM

## 2015-09-13 DIAGNOSIS — Z0183 Encounter for blood typing: Secondary | ICD-10-CM

## 2015-09-13 DIAGNOSIS — M479 Spondylosis, unspecified: Secondary | ICD-10-CM

## 2015-09-13 DIAGNOSIS — E1165 Type 2 diabetes mellitus with hyperglycemia: Secondary | ICD-10-CM | POA: Diagnosis not present

## 2015-09-13 DIAGNOSIS — Z7982 Long term (current) use of aspirin: Secondary | ICD-10-CM

## 2015-09-13 DIAGNOSIS — Z01818 Encounter for other preprocedural examination: Secondary | ICD-10-CM

## 2015-09-13 DIAGNOSIS — J45909 Unspecified asthma, uncomplicated: Secondary | ICD-10-CM

## 2015-09-13 DIAGNOSIS — Z7951 Long term (current) use of inhaled steroids: Secondary | ICD-10-CM

## 2015-09-13 DIAGNOSIS — Z794 Long term (current) use of insulin: Secondary | ICD-10-CM | POA: Insufficient documentation

## 2015-09-13 DIAGNOSIS — Z981 Arthrodesis status: Secondary | ICD-10-CM | POA: Insufficient documentation

## 2015-09-13 DIAGNOSIS — Z87891 Personal history of nicotine dependence: Secondary | ICD-10-CM | POA: Insufficient documentation

## 2015-09-13 DIAGNOSIS — F4310 Post-traumatic stress disorder, unspecified: Secondary | ICD-10-CM | POA: Insufficient documentation

## 2015-09-13 DIAGNOSIS — N179 Acute kidney failure, unspecified: Secondary | ICD-10-CM | POA: Diagnosis not present

## 2015-09-13 DIAGNOSIS — G9341 Metabolic encephalopathy: Secondary | ICD-10-CM | POA: Diagnosis not present

## 2015-09-13 DIAGNOSIS — F319 Bipolar disorder, unspecified: Secondary | ICD-10-CM | POA: Insufficient documentation

## 2015-09-13 DIAGNOSIS — I10 Essential (primary) hypertension: Secondary | ICD-10-CM | POA: Insufficient documentation

## 2015-09-13 DIAGNOSIS — Z79899 Other long term (current) drug therapy: Secondary | ICD-10-CM

## 2015-09-13 DIAGNOSIS — I110 Hypertensive heart disease with heart failure: Secondary | ICD-10-CM | POA: Diagnosis not present

## 2015-09-13 DIAGNOSIS — F419 Anxiety disorder, unspecified: Secondary | ICD-10-CM | POA: Insufficient documentation

## 2015-09-13 DIAGNOSIS — K219 Gastro-esophageal reflux disease without esophagitis: Secondary | ICD-10-CM

## 2015-09-13 DIAGNOSIS — Z01812 Encounter for preprocedural laboratory examination: Secondary | ICD-10-CM

## 2015-09-13 DIAGNOSIS — Z6841 Body Mass Index (BMI) 40.0 and over, adult: Secondary | ICD-10-CM | POA: Diagnosis not present

## 2015-09-13 DIAGNOSIS — M4802 Spinal stenosis, cervical region: Secondary | ICD-10-CM | POA: Diagnosis not present

## 2015-09-13 DIAGNOSIS — M4712 Other spondylosis with myelopathy, cervical region: Secondary | ICD-10-CM | POA: Diagnosis not present

## 2015-09-13 DIAGNOSIS — I5032 Chronic diastolic (congestive) heart failure: Secondary | ICD-10-CM | POA: Diagnosis not present

## 2015-09-13 HISTORY — DX: Reserved for inherently not codable concepts without codable children: IMO0001

## 2015-09-13 HISTORY — DX: Other complications of anesthesia, initial encounter: T88.59XA

## 2015-09-13 HISTORY — DX: Adverse effect of unspecified anesthetic, initial encounter: T41.45XA

## 2015-09-13 LAB — GLUCOSE, CAPILLARY: GLUCOSE-CAPILLARY: 332 mg/dL — AB (ref 65–99)

## 2015-09-13 LAB — CBC
HEMATOCRIT: 40.9 % (ref 36.0–46.0)
HEMOGLOBIN: 13 g/dL (ref 12.0–15.0)
MCH: 26.6 pg (ref 26.0–34.0)
MCHC: 31.8 g/dL (ref 30.0–36.0)
MCV: 83.8 fL (ref 78.0–100.0)
Platelets: 161 10*3/uL (ref 150–400)
RBC: 4.88 MIL/uL (ref 3.87–5.11)
RDW: 13.1 % (ref 11.5–15.5)
WBC: 3.6 10*3/uL — ABNORMAL LOW (ref 4.0–10.5)

## 2015-09-13 LAB — BASIC METABOLIC PANEL
ANION GAP: 8 (ref 5–15)
BUN: 9 mg/dL (ref 6–20)
CO2: 25 mmol/L (ref 22–32)
Calcium: 8.9 mg/dL (ref 8.9–10.3)
Chloride: 101 mmol/L (ref 101–111)
Creatinine, Ser: 1.07 mg/dL — ABNORMAL HIGH (ref 0.44–1.00)
GFR calc Af Amer: >60 mL/min (ref >60)
GFR calc non Af Amer: >60 mL/min (ref >60)
GLUCOSE: 360 mg/dL — AB (ref 65–99)
POTASSIUM: 4.8 mmol/L (ref 3.5–5.1)
Sodium: 134 mmol/L — ABNORMAL LOW (ref 135–145)

## 2015-09-13 LAB — SURGICAL PCR SCREEN
MRSA, PCR: NEGATIVE
Staphylococcus aureus: NEGATIVE

## 2015-09-13 LAB — TYPE AND SCREEN
ABO/RH(D): B POS
ANTIBODY SCREEN: NEGATIVE

## 2015-09-13 LAB — ABO/RH: ABO/RH(D): B POS

## 2015-09-13 LAB — HCG, SERUM, QUALITATIVE: PREG SERUM: NEGATIVE

## 2015-09-13 NOTE — Pre-Procedure Instructions (Addendum)
Diana Henderson  09/13/2015      [facility], [place], [place] - [place] [id number] [place] [address] Phone: [phone] Fax: [phone]  [facility] [place], [place] - [address] [place] [address] Phone: [phone] Fax: [phone]  [facility] - [place], [place] [address] [place] [address] Phone: [phone] Fax: [phone]    Your procedure is scheduled on 09/16/15.  Report to [facility] Admitting at 530 A.M.  Call this number if you have problems the morning of surgery:  [phone]   Remember:  Do not eat food or drink liquids after midnight.  Take these medicines the morning of surgery with A SIP OF WATER inhalers as needed(bring albuterol inhaler),pain med as needed,amlodipine(norvasc),atenolol(tenormin),risperidone,gabapentin(neurontin),hydrxyzine as needed,prilosec  STOP all herbel meds, nsaids (aleve,naproxen,advil,ibuprofen) starting Today including vitamins(multi),aspirin, nabumetone  NO invokana, faixga, janumet am of surgery    How to Manage Your Diabetes Before and After Surgery  Why is it important to control my blood sugar before and after surgery? . Improving blood sugar levels before and after surgery helps healing and can limit problems. . A way of improving blood sugar control is eating a healthy diet by: o  Eating less sugar and carbohydrates o  Increasing activity/exercise o  Talking with your doctor about reaching your blood sugar goals . High blood sugars (greater than 180 mg/dL) can raise your risk of infections and slow your recovery, so you will need to focus on controlling your diabetes during the weeks before surgery. . Make sure that the doctor who takes care of your diabetes knows about your planned surgery  including the date and location.  How do I manage my blood sugar before surgery? . Check your blood sugar at least 4 times a day, starting 2 days before surgery, to make sure that the level is not too high or low. o Check your blood sugar the morning of your surgery when you wake up and every 2 hours until you get to the Short Stay unit. . If your blood sugar is less than 70 mg/dL, you will need to treat for low blood sugar: o Do not take insulin. o Treat a low blood sugar (less than 70 mg/dL) with  cup of clear juice (cranberry or apple), 4 glucose tablets, OR glucose gel. o Recheck blood sugar in 15 minutes after treatment (to make sure it is greater than 70 mg/dL). If your blood sugar is not greater than 70 mg/dL on recheck, call [phone] for further instructions. . Report your blood sugar to the short stay nurse when you get to Short Stay.  . If you are admitted to the hospital after surgery: o Your blood sugar will be checked by the staff and you will probably be given insulin after surgery (instead of oral diabetes medicines) to make sure you have good blood sugar levels. o The goal for blood sugar control after surgery is 80-180 mg/dL.   WHAT DO I DO ABOUT MY DIABETES MEDICATION?   Marland Kitchen Do not take oral diabetes medicines (pills) the morning of surgery.(invokana,farixga,janumet)  . THE NIGHT BEFORE SURGERY,contact pcp/endocrinologist or take 21 units of novolin insulin.       . THE MORNING OF SURGERY,contact pcp/endocrinologist or do not take any novolin   insulin.  Other Instructions:  Patient Signature:  Date:   Nurse Signature:  Date:   Reviewed and Endorsed by [facility] Patient Education Committee, August 2015  Do not wear jewelry, make-up or nail polish.  Do not wear lotions, powders, or perfumes.  You may wear deodorant.  Do not shave 48 hours prior to surgery.  Men may shave face and neck.  Do not bring valuables to the hospital.  [facility] is not  responsible for any belongings or valuables.  Contacts, dentures or bridgework may not be worn into surgery.  Leave your suitcase in the car.  After surgery it may be brought to your room.  For patients admitted to the hospital, discharge time will be determined by your treatment team.  Patients discharged the day of surgery will not be allowed to drive home.   Name and phone number of your driver:    Special instructions:   Special Instructions: [place] - Preparing for Surgery  Before surgery, you can play an important role.  Because skin is not sterile, your skin needs to be as free of germs as possible.  You can reduce the number of germs on you skin by washing with CHG (chlorahexidine gluconate) soap before surgery.  CHG is an antiseptic cleaner which kills germs and bonds with the skin to continue killing germs even after washing.  Please DO NOT use if you have an allergy to CHG or antibacterial soaps.  If your skin becomes reddened/irritated stop using the CHG and inform your nurse when you arrive at Short Stay.  Do not shave (including legs and underarms) for at least 48 hours prior to the first CHG shower.  You may shave your face.  Please follow these instructions carefully:   1.  Shower with CHG Soap the night before surgery and the morning of Surgery.  2.  If you choose to wash your hair, wash your hair first as usual with your normal shampoo.  3.  After you shampoo, rinse your hair and body thoroughly to remove the Shampoo.  4.  Use CHG as you would any other liquid soap.  You can apply chg directly  to the skin and wash gently with scrungie or a clean washcloth.  5.  Apply the CHG Soap to your body ONLY FROM THE NECK DOWN.  Do not use on open wounds or open sores.  Avoid contact with your eyes ears, mouth and genitals (private parts).  Wash genitals (private parts)       with your normal soap.  6.  Wash thoroughly, paying special attention to the area where your surgery will  be performed.  7.  Thoroughly rinse your body with warm water from the neck down.  8.  DO NOT shower/wash with your normal soap after using and rinsing off the CHG Soap.  9.  Pat yourself dry with a clean towel.            10.  Wear clean pajamas.            11.  Place clean sheets on your bed the night of your first shower and do not sleep with pets.  Day of Surgery  Do not apply any lotions/deodorants the morning of surgery.  Please wear clean clothes to the hospital/surgery center.  Please read over the following fact sheets that you were given. Pain Booklet, Coughing and Deep Breathing, Blood Transfusion Information, MRSA Information and Surgical Site Infection Prevention

## 2015-09-14 ENCOUNTER — Encounter (HOSPITAL_COMMUNITY): Payer: Self-pay | Admitting: *Deleted

## 2015-09-14 ENCOUNTER — Emergency Department (HOSPITAL_COMMUNITY)
Admission: EM | Admit: 2015-09-14 | Discharge: 2015-09-15 | Disposition: A | Discharge status: Home / Self Care | Payer: Medicare Other | Source: Home / Self Care | Attending: Emergency Medicine | Admitting: Emergency Medicine

## 2015-09-14 ENCOUNTER — Encounter (HOSPITAL_COMMUNITY): Payer: Self-pay

## 2015-09-14 DIAGNOSIS — Z794 Long term (current) use of insulin: Secondary | ICD-10-CM | POA: Insufficient documentation

## 2015-09-14 DIAGNOSIS — Z91040 Latex allergy status: Secondary | ICD-10-CM

## 2015-09-14 DIAGNOSIS — Z7951 Long term (current) use of inhaled steroids: Secondary | ICD-10-CM | POA: Insufficient documentation

## 2015-09-14 DIAGNOSIS — R739 Hyperglycemia, unspecified: Secondary | ICD-10-CM

## 2015-09-14 DIAGNOSIS — E669 Obesity, unspecified: Secondary | ICD-10-CM | POA: Insufficient documentation

## 2015-09-14 DIAGNOSIS — E1165 Type 2 diabetes mellitus with hyperglycemia: Secondary | ICD-10-CM | POA: Insufficient documentation

## 2015-09-14 DIAGNOSIS — I10 Essential (primary) hypertension: Secondary | ICD-10-CM

## 2015-09-14 DIAGNOSIS — Z79899 Other long term (current) drug therapy: Secondary | ICD-10-CM

## 2015-09-14 DIAGNOSIS — G4733 Obstructive sleep apnea (adult) (pediatric): Secondary | ICD-10-CM | POA: Insufficient documentation

## 2015-09-14 DIAGNOSIS — F319 Bipolar disorder, unspecified: Secondary | ICD-10-CM | POA: Insufficient documentation

## 2015-09-14 DIAGNOSIS — Z87891 Personal history of nicotine dependence: Secondary | ICD-10-CM

## 2015-09-14 DIAGNOSIS — M1990 Unspecified osteoarthritis, unspecified site: Secondary | ICD-10-CM | POA: Insufficient documentation

## 2015-09-14 DIAGNOSIS — J45909 Unspecified asthma, uncomplicated: Secondary | ICD-10-CM

## 2015-09-14 DIAGNOSIS — G8929 Other chronic pain: Secondary | ICD-10-CM

## 2015-09-14 LAB — URINALYSIS, ROUTINE W REFLEX MICROSCOPIC
BILIRUBIN URINE: NEGATIVE
Hgb urine dipstick: NEGATIVE
KETONES UR: NEGATIVE mg/dL
LEUKOCYTES UA: NEGATIVE
Nitrite: NEGATIVE
PH: 5.5 (ref 5.0–8.0)
PROTEIN: NEGATIVE mg/dL
Specific Gravity, Urine: 1.017 (ref 1.005–1.030)

## 2015-09-14 LAB — BASIC METABOLIC PANEL
Anion gap: 12 (ref 5–15)
BUN: 11 mg/dL (ref 6–20)
CHLORIDE: 99 mmol/L — AB (ref 101–111)
CO2: 21 mmol/L — ABNORMAL LOW (ref 22–32)
CREATININE: 1.03 mg/dL — AB (ref 0.44–1.00)
Calcium: 8.9 mg/dL (ref 8.9–10.3)
GFR calc Af Amer: >60 mL/min (ref >60)
GLUCOSE: 333 mg/dL — AB (ref 65–99)
POTASSIUM: 4.2 mmol/L (ref 3.5–5.1)
Sodium: 132 mmol/L — ABNORMAL LOW (ref 135–145)

## 2015-09-14 LAB — CBC
HCT: 42.4 % (ref 36.0–46.0)
Hemoglobin: 13.6 g/dL (ref 12.0–15.0)
MCH: 27.1 pg (ref 26.0–34.0)
MCHC: 32.1 g/dL (ref 30.0–36.0)
MCV: 84.5 fL (ref 78.0–100.0)
PLATELETS: 177 10*3/uL (ref 150–400)
RBC: 5.02 MIL/uL (ref 3.87–5.11)
RDW: 12.9 % (ref 11.5–15.5)
WBC: 5.7 10*3/uL (ref 4.0–10.5)

## 2015-09-14 LAB — URINE MICROSCOPIC-ADD ON
Bacteria, UA: NONE SEEN
WBC, UA: NONE SEEN WBC/hpf (ref 0–5)

## 2015-09-14 LAB — CBG MONITORING, ED
GLUCOSE-CAPILLARY: 334 mg/dL — AB (ref 65–99)
GLUCOSE-CAPILLARY: 276 mg/dL — AB (ref 65–99)
Glucose-Capillary: 473 mg/dL — ABNORMAL HIGH (ref 65–99)

## 2015-09-14 LAB — HEMOGLOBIN A1C
Hgb A1c MFr Bld: 11.1 % — ABNORMAL HIGH (ref 4.8–5.6)
MEAN PLASMA GLUCOSE: 272 mg/dL

## 2015-09-14 MED ORDER — INSULIN ASPART 100 UNIT/ML ~~LOC~~ SOLN
30.0000 [IU] | Freq: Once | SUBCUTANEOUS | Status: AC
  Administered 2015-09-14: 30 [IU] via SUBCUTANEOUS
  Filled 2015-09-14: qty 1

## 2015-09-14 MED ORDER — SODIUM CHLORIDE 0.9 % IV BOLUS (SEPSIS)
1000.0000 mL | Freq: Once | INTRAVENOUS | Status: AC
  Administered 2015-09-14: 1000 mL via INTRAVENOUS

## 2015-09-14 NOTE — Discharge Instructions (Signed)
Hyperglycemia °Hyperglycemia occurs when the glucose (sugar) in your blood is too high. Hyperglycemia can happen for many reasons, but it most often happens to people who do not know they have diabetes or are not managing their diabetes properly.  °CAUSES  °Whether you have diabetes or not, there are other causes of hyperglycemia. Hyperglycemia can occur when you have diabetes, but it can also occur in other situations that you might not be as aware of, such as: °Diabetes °· If you have diabetes and are having problems controlling your blood glucose, hyperglycemia could occur because of some of the following reasons: °· Not following your meal plan. °· Not taking your diabetes medications or not taking it properly. °· Exercising less or doing less activity than you normally do. °· Being sick. °Pre-diabetes °· This cannot be ignored. Before people develop Type 2 diabetes, they almost always have "pre-diabetes." This is when your blood glucose levels are higher than normal, but not yet high enough to be diagnosed as diabetes. Research has shown that some long-term damage to the body, especially the heart and circulatory system, may already be occurring during pre-diabetes. If you take action to manage your blood glucose when you have pre-diabetes, you may delay or prevent Type 2 diabetes from developing. °Stress °· If you have diabetes, you may be "diet" controlled or on oral medications or insulin to control your diabetes. However, you may find that your blood glucose is higher than usual in the hospital whether you have diabetes or not. This is often referred to as "stress hyperglycemia." Stress can elevate your blood glucose. This happens because of hormones put out by the body during times of stress. If stress has been the cause of your high blood glucose, it can be followed regularly by your caregiver. That way he/she can make sure your hyperglycemia does not continue to get worse or progress to  diabetes. °Steroids °· Steroids are medications that act on the infection fighting system (immune system) to block inflammation or infection. One side effect can be a rise in blood glucose. Most people can produce enough extra insulin to allow for this rise, but for those who cannot, steroids make blood glucose levels go even higher. It is not unusual for steroid treatments to "uncover" diabetes that is developing. It is not always possible to determine if the hyperglycemia will go away after the steroids are stopped. A special blood test called an A1c is sometimes done to determine if your blood glucose was elevated before the steroids were started. °SYMPTOMS °· Thirsty. °· Frequent urination. °· Dry mouth. °· Blurred vision. °· Tired or fatigue. °· Weakness. °· Sleepy. °· Tingling in feet or leg. °DIAGNOSIS  °Diagnosis is made by monitoring blood glucose in one or all of the following ways: °· A1c test. This is a chemical found in your blood. °· Fingerstick blood glucose monitoring. °· Laboratory results. °TREATMENT  °First, knowing the cause of the hyperglycemia is important before the hyperglycemia can be treated. Treatment may include, but is not be limited to: °· Education. °· Change or adjustment in medications. °· Change or adjustment in meal plan. °· Treatment for an illness, infection, etc. °· More frequent blood glucose monitoring. °· Change in exercise plan. °· Decreasing or stopping steroids. °· Lifestyle changes. °HOME CARE INSTRUCTIONS  °· Test your blood glucose as directed. °· Exercise regularly. Your caregiver will give you instructions about exercise. Pre-diabetes or diabetes which comes on with stress is helped by exercising. °· Eat wholesome,   balanced meals. Eat often and at regular, fixed times. Your caregiver or nutritionist will give you a meal plan to guide your sugar intake.  Being at an ideal weight is important. If needed, losing as little as 10 to 15 pounds may help improve blood  glucose levels. SEEK MEDICAL CARE IF:   You have questions about medicine, activity, or diet.  You continue to have symptoms (problems such as increased thirst, urination, or weight gain). SEEK IMMEDIATE MEDICAL CARE IF:   You are vomiting or have diarrhea.  Your breath smells fruity.  You are breathing faster or slower.  You are very sleepy or incoherent.  You have numbness, tingling, or pain in your feet or hands.  You have chest pain.  Your symptoms get worse even though you have been following your caregiver's orders.  If you have any other questions or concerns.   This information is not intended to replace advice given to you by your health care provider. Make sure you discuss any questions you have with your health care provider.   Document Released: 11/28/2000 Document Revised: 08/27/2011 Document Reviewed: 02/08/2015 Elsevier Interactive Patient Education 2016 Elsevier Inc.  Prediabetes Eating Plan Prediabetes--also called impaired glucose tolerance or impaired fasting glucose--is a condition that causes blood sugar (blood glucose) levels to be higher than normal. Following a healthy diet can help to keep prediabetes under control. It can also help to lower the risk of type 2 diabetes and heart disease, which are increased in people who have prediabetes. Along with regular exercise, a healthy diet:  Promotes weight loss.  Helps to control blood sugar levels.  Helps to improve the way that the body uses insulin. WHAT DO I NEED TO KNOW ABOUT THIS EATING PLAN?  Use the glycemic index (GI) to plan your meals. The index tells you how quickly a food will raise your blood sugar. Choose low-GI foods. These foods take a longer time to raise blood sugar.  Pay close attention to the amount of carbohydrates in the food that you eat. Carbohydrates increase blood sugar levels.  Keep track of how many calories you take in. Eating the right amount of calories will help you to  achieve a healthy weight. Losing about 7 percent of your starting weight can help to prevent type 2 diabetes.  You may want to follow a Mediterranean diet. This diet includes a lot of vegetables, lean meats or fish, whole grains, fruits, and healthy oils and fats. WHAT FOODS CAN I EAT? Grains Whole grains, such as whole-wheat or whole-grain breads, crackers, cereals, and pasta. Unsweetened oatmeal. Bulgur. Barley. Quinoa. Brown rice. Corn or whole-wheat flour tortillas or taco shells. Vegetables Lettuce. Spinach. Peas. Beets. Cauliflower. Cabbage. Broccoli. Carrots. Tomatoes. Squash. Eggplant. Herbs. Peppers. Onions. Cucumbers. Brussels sprouts. Fruits Berries. Bananas. Apples. Oranges. Grapes. Papaya. Mango. Pomegranate. Kiwi. Grapefruit. Cherries. Meats and Other Protein Sources Seafood. Lean meats, such as chicken and [place] or lean cuts of pork and beef. Tofu. Eggs. Nuts. Beans. Dairy Low-fat or fat-free dairy products, such as yogurt, cottage cheese, and cheese. Beverages Water. Tea. Coffee. Sugar-free or diet soda. Seltzer water. Milk. Milk alternatives, such as soy or almond milk. Condiments Mustard. Relish. Low-fat, low-sugar ketchup. Low-fat, low-sugar barbecue sauce. Low-fat or fat-free mayonnaise. Sweets and Desserts Sugar-free or low-fat pudding. Sugar-free or low-fat ice cream and other frozen treats. Fats and Oils Avocado. Walnuts. Olive oil. The items listed above may not be a complete list of recommended foods or beverages. Contact your dietitian for more options.  WHAT FOODS ARE NOT RECOMMENDED? Grains Refined white flour and flour products, such as bread, pasta, snack foods, and cereals. Beverages Sweetened drinks, such as sweet iced tea and soda. Sweets and Desserts Baked goods, such as cake, cupcakes, pastries, cookies, and cheesecake. The items listed above may not be a complete list of foods and beverages to avoid. Contact your dietitian for more information.    This information is not intended to replace advice given to you by your health care provider. Make sure you discuss any questions you have with your health care provider.   Document Released: 10/19/2014 Document Reviewed: 10/19/2014 Elsevier Interactive Patient Education 2016 [place].  Blood Glucose Monitoring, Adult Monitoring your blood glucose (also know as blood sugar) helps you to manage your diabetes. It also helps you and your health care provider monitor your diabetes and determine how well your treatment plan is working. WHY SHOULD YOU MONITOR YOUR BLOOD GLUCOSE?  It can help you understand how food, exercise, and medicine affect your blood glucose.  It allows you to know what your blood glucose is at any given moment. You can quickly tell if you are having low blood glucose (hypoglycemia) or high blood glucose (hyperglycemia).  It can help you and your health care provider know how to adjust your medicines.  It can help you understand how to manage an illness or adjust medicine for exercise. WHEN SHOULD YOU TEST? Your health care provider will help you decide how often you should check your blood glucose. This may depend on the type of diabetes you have, your diabetes control, or the types of medicines you are taking. Be sure to write down all of your blood glucose readings so that this information can be reviewed with your health care provider. See below for examples of testing times that your health care provider may suggest. Type 1 Diabetes  Test at least 2 times per day if your diabetes is well controlled, if you are using an insulin pump, or if you perform multiple daily injections.  If your diabetes is not well controlled or if you are sick, you may need to test more often.  It is a good idea to also test:  Before every insulin injection.  Before and after exercise.  Between meals and 2 hours after a meal.  Occasionally between 2:00 a.m. and 3:00 a.m. Type 2  Diabetes  If you are taking insulin, test at least 2 times per day. However, it is best to test before every insulin injection.  If you take medicines by mouth (orally), test 2 times a day.  If you are on a controlled diet, test once a day.  If your diabetes is not well controlled or if you are sick, you may need to monitor more often. HOW TO MONITOR YOUR BLOOD GLUCOSE Supplies Needed  Blood glucose meter.  Test strips for your meter. Each meter has its own strips. You must use the strips that go with your own meter.  A pricking needle (lancet).  A device that holds the lancet (lancing device).  A journal or log book to write down your results. Procedure  Wash your hands with soap and water. Alcohol is not preferred.  Prick the side of your finger (not the tip) with the lancet.  Gently milk the finger until a small drop of blood appears.  Follow the instructions that come with your meter for inserting the test strip, applying blood to the strip, and using your blood glucose meter. Other  Areas to Get Blood for Testing Some meters allow you to use other areas of your body (other than your finger) to test your blood. These areas are called alternative sites. The most common alternative sites are:  The forearm.  The thigh.  The back area of the lower leg.  The palm of the hand. The blood flow in these areas is slower. Therefore, the blood glucose values you get may be delayed, and the numbers are different from what you would get from your fingers. Do not use alternative sites if you think you are having hypoglycemia. Your reading will not be accurate. Always use a finger if you are having hypoglycemia. Also, if you cannot feel your lows (hypoglycemia unawareness), always use your fingers for your blood glucose checks. ADDITIONAL TIPS FOR GLUCOSE MONITORING  Do not reuse lancets.  Always carry your supplies with you.  All blood glucose meters have a 24-hour "hotline" number  to call if you have questions or need help.  Adjust (calibrate) your blood glucose meter with a control solution after finishing a few boxes of strips. BLOOD GLUCOSE RECORD KEEPING It is a good idea to keep a daily record or log of your blood glucose readings. Most glucose meters, if not all, keep your glucose records stored in the meter. Some meters come with the ability to download your records to your home computer. Keeping a record of your blood glucose readings is especially helpful if you are wanting to look for patterns. Make notes to go along with the blood glucose readings because you might forget what happened at that exact time. Keeping good records helps you and your health care provider to work together to achieve good diabetes management.    This information is not intended to replace advice given to you by your health care provider. Make sure you discuss any questions you have with your health care provider.   Document Released: 06/07/2003 Document Revised: 06/25/2014 Document Reviewed: 10/27/2012 Elsevier Interactive Patient Education [employer].

## 2015-09-14 NOTE — ED Notes (Signed)
The pt has been c/o a headache her bp was high all day.  She has had hypertension for the past 2 days.  She is scheduled for surgery Friday and she was told to come and get bp down

## 2015-09-14 NOTE — Progress Notes (Signed)
Anesthesia Chart Review: Pt is 45 year old female scheduled for C4-7 laminectomies with lateral mass fusion on 09/16/15 by Dr. Kathyrn Sheriff.  History includes DM2, HTN, HLD, depression, PTSD, depression, Bipolar 1 disorder, anxiety, OSA with inconsistent CPAP use, asthma, GERD, migraines, SOB, former smoker, C5-6 ACDF 11/05/14. BMI is consistent with morbid obesity.  Anesthesia history: Of note, her C5-6 ACDF was originally scheduled on 09/03/2014. See Dr. Zettie Pho anesthesia note from that day. After induction, she developed severe hypertension [SBP 240-260/DBP 120-140] that was refractory to intervention. Case cancelled, and she was admitted for evaluation. Drug screen negative except for benzodiazepines. CK, CKMB and troponin negative. TSH and T4 normal. Urine metanephrines from 24 hour urine normal. EEG normal. Hospitalization complicated by acute metabolic encephalopathy/hypertensive encephalopathy. Hospitalization complicated by hyperammonemia thought to be a result of fatty liver disease. No cause of malignant hypertension identified. For her 11/05/14 surgery, remifentanyl infusion planned to attenuate hyperdynamic induction. She was also noted to have poor peripheral IV access.  PCP is Dr. Iona Beard Osei-Bonsu. Pulmonologist was Dr. Danton Sewer, but is scheduled to see Dr. Brand Males on 11/03/15 for one year follow-up.   Meds include albuterol, ASA 81 mg, amlodipine 10 mg daily, atenolol 50 mg daily, Qvar, Symbicort, Cymbalta, Farxiga, Flonase, Neurontin, hydroxyzine, Novolin 70/30, Invokana, Janumet, lactulose PRN, Nasonex, Relafen, losartan 20 mg daily, omeprazole, Zofran, Percocet, risperidone, Zanaflex.  PAT Vitals: BP 115/65, HR 79, RR 20, T 36.8C, O2 sat 100%. CBG (non-fasting, ate within one hour before labs) 332.  08/17/15 EKG: NSR, low voltage QRS.  09/06/14 Echo: - Left ventricle: The cavity size was normal. Wall thickness was increased in a pattern of mild LVH. Systolic function was  normal. The estimated ejection fraction was in the range of 50% to 55%. Wall motion was normal; there were no regional wall motion abnormalities. Doppler parameters are consistent with abnormal left ventricular relaxation (grade 1 diastolic dysfunction).  Preoperative labs noted. Na 134, Cr 1.07. H/H 13.0/40.9. Serum pregnancy is negative. A1c is elevated at 11.1, consistent with mean plasma glucose of 272 (Her A1c in 04/2014 was 7.9, so much poor DM control now.). Patient reported that historically her fasting glucose levels run ~ 230 but she is not longer checking them because she ran out of testing strips. She told her PAT RN that Medicaid will not cover, and she can't afford to buy them. I have notified Manuela Schwartz at Dr. Cleotilde Neer office of patient's poorly uncontrolled DM and that if he wishes to proceed as scheduled then patient is at high risk to get cancelled if her fasting glucose is much over 200 on the day of surgery. If she does have surgery then she would benefit from a DM coordinator or hospitalist consult post-operatively.  George [facility] Short Stay Center/Anesthesiology Phone [phone] 09/14/2015 9:57 AM

## 2015-09-14 NOTE — ED Provider Notes (Signed)
CSN: [id number]     Arrival date & time 09/14/15  1634 History   First MD Initiated Contact with Patient 09/14/15 2046     Chief Complaint  Patient presents with  . Hyperglycemia     (Consider location/radiation/quality/duration/timing/severity/associated sxs/prior Treatment) HPI   Patient is a 45 year old female history of morbid obesity, poorly managed diabetes, hypertension, hyperlipidemia, bipolar, asthma, chronic back pain, she states that she has been doing preoperative lab work for spinal surgery and has been notified multiple times this week that her sugars have been elevated. She states that her surgery is scheduled for Friday and they will cancel it unless she is able to bring her blood sugars down. She attempted to see her PCP today but was too late in the day to get an appointment, so she presented to the ER for evaluation. She states that she has had all of her meds for the past month and a half, for diabetes and includes insulin pen, Invokana and Janumet. She states that her insurance does not pay for glucometer strips and so she is unable to check her sugars regularly.  She was able check today with a friend's glucometer and noted it was above 300. When she presented to the ER it was over 400.  She states over the past 2 days she has been feeling slightly tired and has frequent urination, she denies any other symptoms at this time.  She denies weight loss, chest pain, shortness of breath, lightheadedness, excessive thirst, N, V, D, abdominal pain.  No other complaints.  Past Medical History  Diagnosis Date  . Diabetes mellitus   . Hypertension   . Hyperlipidemia   . Depression   . PTSD (post-traumatic stress disorder)   . Pain in joint, ankle and foot 02/17/2013  . Anxiety   . Bipolar 1 disorder ([place])   . Sleep apnea     uses CPAP intermittently, pt. doesn't remember where she had the study  . Asthma     has been eval. by Dr. Gwenette Greet- last 06/2014  . Pneumonia     hosp. for  a couple of day- not sure when it was   . GERD (gastroesophageal reflux disease)   . Headache     sometimes has migraines   . Arthritis     osteoarthritis   . OSA on CPAP   . Knee pain, chronic   . Shortness of breath dyspnea   . Complication of anesthesia     09/13/14: malignant HTN with inducction, case cx but no sourse identified; 11/05/14 remifentanyl infusion used to attenuate hypergynamic induction   Past Surgical History  Procedure Laterality Date  . Tubal ligation    . Midtarsal arthrodesis Right 07/17/12    Rt foot  . Remove implant deep Right 07/17/12    Rt foot  . Lapidus fusion Right 01/10/12    Rt foot  . Carpal tunnel release Left 2001    Left wrist  . Gastroc recession extremity Right 3//20/14    Right foot  . Hernia repair      1996  . Remove implant deep Right 07/22/2014    Rt foot @ [facility]  . Cotton osteotomy w/ bone graft Right 09/30/2014    Rt foot @ [facility]  . Anterior cervical decomp/discectomy fusion N/A 11/05/2014    Procedure: ANTERIOR CERVICAL DECOMPRESSION/DISCECTOMY FUSION 1 LEVEL;  Surgeon: Consuella Lose, MD;  Location: [facility];  Service: Neurosurgery;  Laterality: N/A;  Cervical five- cervical seven anterior cervical decompression with  fusion interbody prosthesis plating and bonegraft  . Cervical laminectomy  03/31/20176    CERVICAL 4 SEVEN LAMINECTOMIES WITH LATERAL FUSION   Family History  Problem Relation Age of Onset  . Heart attack Father   . Kidney disease Mother    Social History  Substance Use Topics  . Smoking status: Former Smoker -- 2.00 packs/day for 6 years    Types: Cigarettes    Quit date: 06/18/1978  . Smokeless tobacco: Never Used  . Alcohol Use: 0.0 oz/week    0 Standard drinks or equivalent per week     Comment: weekend- use of beer    OB History    No data available     Review of Systems  All other systems reviewed and are negative.     Allergies  Celery oil; Pork-derived products; and Latex  Home Medications    Prior to Admission medications   Medication Sig Start Date End Date Taking? Authorizing Provider  albuterol (PROVENTIL HFA;VENTOLIN HFA) 108 (90 BASE) MCG/ACT inhaler Inhale 1-2 puffs into the lungs every 6 (six) hours as needed for wheezing or shortness of breath. 04/07/15  Yes Chesley Noon Nadeau, PA-C  amLODipine (NORVASC) 10 MG tablet Take 1 tablet (10 mg total) by mouth daily. 09/10/14  Yes Eugenie Filler, MD  atenolol (TENORMIN) 50 MG tablet Take 1 tablet (50 mg total) by mouth daily. 09/10/14  Yes Eugenie Filler, MD  beclomethasone (QVAR) 80 MCG/ACT inhaler Inhale 1 puff into the lungs 2 (two) times daily.   Yes Historical Provider, MD  budesonide-formoterol (SYMBICORT) 160-4.5 MCG/ACT inhaler Inhale 2 puffs into the lungs 2 (two) times daily.   Yes Historical Provider, MD  DULoxetine (CYMBALTA) 60 MG capsule Take 60 mg by mouth at bedtime.    Yes Historical Provider, MD  FARXIGA 10 MG TABS tablet Take 10 mg by mouth daily. 08/22/15  Yes Historical Provider, MD  fluticasone (FLONASE) 50 MCG/ACT nasal spray Place 1 spray into both nostrils 2 (two) times daily. 04/07/15  Yes Nona Dell, PA-C  gabapentin (NEURONTIN) 600 MG tablet Take 600 mg by mouth 3 (three) times daily.   Yes Historical Provider, MD  hydrOXYzine (ATARAX/VISTARIL) 50 MG tablet Take 50 mg by mouth 3 (three) times daily as needed for anxiety.  10/22/14  Yes Historical Provider, MD  insulin NPH-regular Human (NOVOLIN 70/30) (70-30) 100 UNIT/ML injection Inject 30 Units into the skin 2 (two) times daily with a meal.   Yes Historical Provider, MD  INVOKANA 300 MG TABS tablet Take 300 mg by mouth daily. 09/18/14  Yes Historical Provider, MD  JANUMET XR 50-1000 MG TB24 Take 1 tablet by mouth daily. 09/18/14  Yes Historical Provider, MD  lactulose (CHRONULAC) 10 GM/15ML solution Take 45 mLs (30 g total) by mouth 3 (three) times daily. 09/10/14  Yes Eugenie Filler, MD  mometasone (NASONEX) 50 MCG/ACT nasal spray Place  2 sprays into the nose at bedtime. 04/07/15  Yes Rigoberto Noel, MD  Multiple Vitamin (MULTIVITAMIN) tablet Take 1 tablet by mouth daily. Reported on 09/12/2015   Yes Historical Provider, MD  nabumetone (RELAFEN) 500 MG tablet Take 1 tablet (500 mg total) by mouth 2 (two) times daily. 07/25/15  Yes Myeong O Sheard, DPM  olmesartan (BENICAR) 20 MG tablet Take 20 mg by mouth daily.   Yes Historical Provider, MD  omeprazole (PRILOSEC) 20 MG capsule Take 20 mg by mouth daily.    Yes Historical Provider, MD  ondansetron (ZOFRAN ODT) 8 MG disintegrating tablet  Take 1 tablet (8 mg total) by mouth every 8 (eight) hours as needed for nausea or vomiting. 01/28/14  Yes Sherwood Gambler, MD  oxyCODONE-acetaminophen (PERCOCET) 10-325 MG tablet Take 1 tablet by mouth every 8 (eight) hours as needed for pain. 08/29/15  Yes Myeong O Sheard, DPM  Psyllium (METAMUCIL PO) Take 1 scoop by mouth daily as needed (for constipation).    Yes Historical Provider, MD  risperiDONE (RISPERDAL) 0.5 MG tablet Take 0.5-1 mg by mouth 2 (two) times daily. Take 0.5 mg in the morning and take 1 mg in the evening 10/22/14  Yes Historical Provider, MD  tiZANidine (ZANAFLEX) 4 MG tablet Take 1 tablet (4 mg total) by mouth every 8 (eight) hours as needed for muscle spasms. 09/10/14  Yes Eugenie Filler, MD  trimethoprim-polymyxin b (POLYTRIM) ophthalmic solution Place 1 drop into the left eye every 4 (four) hours. 02/19/14   Donne Hazel, MD   BP 133/87 mmHg  Pulse 75  Temp(Src) 98.1 F (36.7 C) (Oral)  Resp 18  Ht 5\' 6"  (1.676 m)  Wt 138.432 kg  BMI 49.28 kg/m2  SpO2 98%  LMP 06/02/2015 Physical Exam  Constitutional: She is oriented to person, place, and time. She appears well-developed and well-nourished. No distress.  Obese female, NAD, non-toxic in appearance  HENT:  Head: Normocephalic and atraumatic.  Nose: Nose normal.  Mouth/Throat: Oropharynx is clear and moist. No oropharyngeal exudate.  Eyes: Conjunctivae and EOM are normal.  Pupils are equal, round, and reactive to light. Right eye exhibits no discharge. Left eye exhibits no discharge. No scleral icterus.  Neck: Normal range of motion. Neck supple. No JVD present. No tracheal deviation present. No thyromegaly present.  Cardiovascular: Normal rate, regular rhythm, normal heart sounds and intact distal pulses.  Exam reveals no gallop and no friction rub.   No murmur heard. Pulmonary/Chest: Effort normal and breath sounds normal. No respiratory distress. She has no wheezes. She has no rales. She exhibits no tenderness.  Abdominal: Soft. Bowel sounds are normal. She exhibits no distension and no mass. There is no tenderness. There is no rebound and no guarding.  Musculoskeletal: Normal range of motion. She exhibits no edema.  Lymphadenopathy:    She has no cervical adenopathy.  Neurological: She is alert and oriented to person, place, and time. She has normal reflexes. No cranial nerve deficit. She exhibits normal muscle tone. Coordination normal.  Skin: Skin is warm and dry. No rash noted. She is not diaphoretic. No erythema. No pallor.  Psychiatric: She has a normal mood and affect. Her behavior is normal. Judgment and thought content normal.  Nursing note and vitals reviewed.   ED Course  Procedures (including critical care time) Labs Review Labs Reviewed  BASIC METABOLIC PANEL - Abnormal; Notable for the following:    Sodium 132 (*)    Chloride 99 (*)    CO2 21 (*)    Glucose, Bld 333 (*)    Creatinine, Ser 1.03 (*)    All other components within normal limits  URINALYSIS, ROUTINE W REFLEX MICROSCOPIC (NOT AT [facility]) - Abnormal; Notable for the following:    Glucose, UA >1000 (*)    All other components within normal limits  URINE MICROSCOPIC-ADD ON - Abnormal; Notable for the following:    Squamous Epithelial / LPF 0-5 (*)    All other components within normal limits  CBG MONITORING, ED - Abnormal; Notable for the following:    Glucose-Capillary 473 (*)     All other  components within normal limits  CBG MONITORING, ED - Abnormal; Notable for the following:    Glucose-Capillary 334 (*)    All other components within normal limits  CBG MONITORING, ED - Abnormal; Notable for the following:    Glucose-Capillary 276 (*)    All other components within normal limits  CBG MONITORING, ED - Abnormal; Notable for the following:    Glucose-Capillary 122 (*)    All other components within normal limits  CBC    Imaging Review No results found. I have personally reviewed and evaluated these images and lab results as part of my medical decision-making.   EKG Interpretation None      MDM   Patient with hyperglycemia, presents because she wants better blood sugar control so she can have surgery on Friday.  CBG is 473, and improved with IV fluids and home dose of insulin.  Suspect patient needs basal insulin however she does not have glucometer and strips at home, and without this, I am  hesitant to prescribe this tonight, as the risk of hypoglycemia is too great. She should follow up with her PCP in the morning, to manage this problem. The patient is stable for discharge, she has no concerning symptoms, she appears well hydrated, she does not have an anion gap. Return precautions were reviewed. Patient was discharged home in good condition, reiterated she needs to follow up with her PCP first thing in the morning to discuss need for basal insulin addition to her regimen.   Final diagnoses:  Hyperglycemia      Delsa Grana, PA-C 09/19/15 [place], MD 09/20/15 [phone]

## 2015-09-14 NOTE — ED Notes (Signed)
The pt is now saying her blood sugar not her blood pressure is high  sghe reports that it was 308 earlier today

## 2015-09-15 LAB — CBG MONITORING, ED: Glucose-Capillary: 122 mg/dL — ABNORMAL HIGH (ref 65–99)

## 2015-09-15 MED ORDER — DEXTROSE 5 % IV SOLN
3.0000 g | INTRAVENOUS | Status: AC
  Administered 2015-09-16: 3 g via INTRAVENOUS
  Filled 2015-09-15: qty 3000

## 2015-09-15 NOTE — Anesthesia Preprocedure Evaluation (Addendum)
Anesthesia Evaluation  Patient identified by MRN, date of birth, ID band Patient awake    Reviewed: Allergy & Precautions, NPO status , Patient's Chart, lab work & pertinent test results, reviewed documented beta blocker date and time   Airway Mallampati: III  TM Distance: >3 FB Neck ROM: Full    Dental  (+) Teeth Intact, Dental Advisory Given   Pulmonary shortness of breath, asthma , sleep apnea , former smoker,    breath sounds clear to auscultation       Cardiovascular hypertension, Pt. on medications and Pt. on home beta blockers + DOE   Rhythm:Regular Rate:Normal  08/2014 TTE: EF 50-55%. Normal valves.   Grade I diastolic dysfunction.  Had surgery cancelled last year after induction secondary to uncontrollable hypertension    Neuro/Psych Anxiety Depression Bipolar Disorder negative neurological ROS     GI/Hepatic Neg liver ROS, GERD  Medicated,  Endo/Other  diabetes, Poorly Controlled, Type 2, Insulin DependentMorbid obesitySugars were over 300 in ER 09/14/2015  Renal/GU negative Renal ROS     Musculoskeletal  (+) Arthritis ,   Abdominal (+) + obese,   Peds  Hematology negative hematology ROS (+) 13/42   Anesthesia Other Findings   Reproductive/Obstetrics                        Anesthesia Physical Anesthesia Plan  ASA: III  Anesthesia Plan: General   Post-op Pain Management:    Induction: Intravenous  Airway Management Planned: Oral ETT and Video Laryngoscope Planned  Additional Equipment:   Intra-op Plan:   Post-operative Plan: Extubation in OR  Informed Consent: I have reviewed the patients History and Physical, chart, labs and discussed the procedure including the risks, benefits and alternatives for the proposed anesthesia with the patient or authorized representative who has indicated his/her understanding and acceptance.     Plan Discussed with:   Anesthesia Plan  Comments: (Surgery had to be cancelled last year 2nd to uncontrollable hypertension after induction.   Etiology never determined.   Will give 233mcg of Fentanyl as part of her induction this time, LTA,GLIDE, Multimodal pain RX, Check Glucose in AM)       Anesthesia Quick Evaluation

## 2015-09-16 ENCOUNTER — Inpatient Hospital Stay (HOSPITAL_COMMUNITY)
Admission: RE | Admit: 2015-09-16 | Discharge: 2015-09-23 | DRG: 471 | Disposition: A | Discharge status: Skilled Nursing Facility | Payer: Medicare Other | Source: Ambulatory Visit | Attending: Neurosurgery | Admitting: Neurosurgery

## 2015-09-16 ENCOUNTER — Encounter (HOSPITAL_COMMUNITY): Payer: Self-pay | Admitting: *Deleted

## 2015-09-16 ENCOUNTER — Inpatient Hospital Stay (HOSPITAL_COMMUNITY): Payer: Medicare Other | Admitting: Vascular Surgery

## 2015-09-16 ENCOUNTER — Encounter (HOSPITAL_COMMUNITY)
Admission: RE | Disposition: A | Discharge status: Skilled Nursing Facility | Payer: Self-pay | Source: Ambulatory Visit | Attending: Neurosurgery

## 2015-09-16 ENCOUNTER — Inpatient Hospital Stay (HOSPITAL_COMMUNITY): Payer: Medicare Other | Admitting: Anesthesiology

## 2015-09-16 ENCOUNTER — Inpatient Hospital Stay (HOSPITAL_COMMUNITY): Payer: Medicare Other

## 2015-09-16 DIAGNOSIS — I10 Essential (primary) hypertension: Secondary | ICD-10-CM | POA: Diagnosis not present

## 2015-09-16 DIAGNOSIS — Z6841 Body Mass Index (BMI) 40.0 and over, adult: Secondary | ICD-10-CM | POA: Diagnosis not present

## 2015-09-16 DIAGNOSIS — K5900 Constipation, unspecified: Secondary | ICD-10-CM | POA: Diagnosis present

## 2015-09-16 DIAGNOSIS — E785 Hyperlipidemia, unspecified: Secondary | ICD-10-CM | POA: Diagnosis present

## 2015-09-16 DIAGNOSIS — Z9119 Patient's noncompliance with other medical treatment and regimen: Secondary | ICD-10-CM

## 2015-09-16 DIAGNOSIS — M4802 Spinal stenosis, cervical region: Secondary | ICD-10-CM | POA: Diagnosis not present

## 2015-09-16 DIAGNOSIS — J45909 Unspecified asthma, uncomplicated: Secondary | ICD-10-CM | POA: Diagnosis present

## 2015-09-16 DIAGNOSIS — Z91040 Latex allergy status: Secondary | ICD-10-CM | POA: Diagnosis not present

## 2015-09-16 DIAGNOSIS — M50021 Cervical disc disorder at C4-C5 level with myelopathy: Secondary | ICD-10-CM | POA: Diagnosis not present

## 2015-09-16 DIAGNOSIS — F319 Bipolar disorder, unspecified: Secondary | ICD-10-CM | POA: Diagnosis not present

## 2015-09-16 DIAGNOSIS — E869 Volume depletion, unspecified: Secondary | ICD-10-CM | POA: Diagnosis not present

## 2015-09-16 DIAGNOSIS — E1065 Type 1 diabetes mellitus with hyperglycemia: Secondary | ICD-10-CM | POA: Diagnosis not present

## 2015-09-16 DIAGNOSIS — M4322 Fusion of spine, cervical region: Secondary | ICD-10-CM | POA: Diagnosis not present

## 2015-09-16 DIAGNOSIS — G4733 Obstructive sleep apnea (adult) (pediatric): Secondary | ICD-10-CM | POA: Diagnosis present

## 2015-09-16 DIAGNOSIS — I110 Hypertensive heart disease with heart failure: Secondary | ICD-10-CM | POA: Diagnosis present

## 2015-09-16 DIAGNOSIS — E119 Type 2 diabetes mellitus without complications: Secondary | ICD-10-CM | POA: Diagnosis not present

## 2015-09-16 DIAGNOSIS — K219 Gastro-esophageal reflux disease without esophagitis: Secondary | ICD-10-CM | POA: Diagnosis present

## 2015-09-16 DIAGNOSIS — N179 Acute kidney failure, unspecified: Secondary | ICD-10-CM | POA: Diagnosis not present

## 2015-09-16 DIAGNOSIS — F4310 Post-traumatic stress disorder, unspecified: Secondary | ICD-10-CM | POA: Diagnosis present

## 2015-09-16 DIAGNOSIS — Z841 Family history of disorders of kidney and ureter: Secondary | ICD-10-CM

## 2015-09-16 DIAGNOSIS — R339 Retention of urine, unspecified: Secondary | ICD-10-CM | POA: Diagnosis not present

## 2015-09-16 DIAGNOSIS — M4712 Other spondylosis with myelopathy, cervical region: Secondary | ICD-10-CM | POA: Diagnosis present

## 2015-09-16 DIAGNOSIS — Z79899 Other long term (current) drug therapy: Secondary | ICD-10-CM

## 2015-09-16 DIAGNOSIS — IMO0002 Reserved for concepts with insufficient information to code with codable children: Secondary | ICD-10-CM | POA: Diagnosis present

## 2015-09-16 DIAGNOSIS — Z419 Encounter for procedure for purposes other than remedying health state, unspecified: Secondary | ICD-10-CM

## 2015-09-16 DIAGNOSIS — F411 Generalized anxiety disorder: Secondary | ICD-10-CM | POA: Diagnosis present

## 2015-09-16 DIAGNOSIS — E118 Type 2 diabetes mellitus with unspecified complications: Secondary | ICD-10-CM

## 2015-09-16 DIAGNOSIS — Z4789 Encounter for other orthopedic aftercare: Secondary | ICD-10-CM | POA: Diagnosis not present

## 2015-09-16 DIAGNOSIS — G9341 Metabolic encephalopathy: Secondary | ICD-10-CM | POA: Diagnosis not present

## 2015-09-16 DIAGNOSIS — E7220 Disorder of urea cycle metabolism, unspecified: Secondary | ICD-10-CM | POA: Diagnosis present

## 2015-09-16 DIAGNOSIS — K760 Fatty (change of) liver, not elsewhere classified: Secondary | ICD-10-CM | POA: Diagnosis present

## 2015-09-16 DIAGNOSIS — I519 Heart disease, unspecified: Secondary | ICD-10-CM | POA: Diagnosis present

## 2015-09-16 DIAGNOSIS — I5189 Other ill-defined heart diseases: Secondary | ICD-10-CM | POA: Diagnosis present

## 2015-09-16 DIAGNOSIS — Z794 Long term (current) use of insulin: Secondary | ICD-10-CM | POA: Diagnosis not present

## 2015-09-16 DIAGNOSIS — Z87891 Personal history of nicotine dependence: Secondary | ICD-10-CM

## 2015-09-16 DIAGNOSIS — I5032 Chronic diastolic (congestive) heart failure: Secondary | ICD-10-CM | POA: Diagnosis present

## 2015-09-16 DIAGNOSIS — Z8249 Family history of ischemic heart disease and other diseases of the circulatory system: Secondary | ICD-10-CM

## 2015-09-16 DIAGNOSIS — M1990 Unspecified osteoarthritis, unspecified site: Secondary | ICD-10-CM | POA: Diagnosis present

## 2015-09-16 DIAGNOSIS — Z7951 Long term (current) use of inhaled steroids: Secondary | ICD-10-CM | POA: Diagnosis not present

## 2015-09-16 DIAGNOSIS — E1165 Type 2 diabetes mellitus with hyperglycemia: Secondary | ICD-10-CM | POA: Diagnosis present

## 2015-09-16 DIAGNOSIS — Z91018 Allergy to other foods: Secondary | ICD-10-CM | POA: Diagnosis not present

## 2015-09-16 DIAGNOSIS — Z981 Arthrodesis status: Secondary | ICD-10-CM | POA: Diagnosis not present

## 2015-09-16 DIAGNOSIS — G8929 Other chronic pain: Secondary | ICD-10-CM | POA: Diagnosis present

## 2015-09-16 DIAGNOSIS — M6281 Muscle weakness (generalized): Secondary | ICD-10-CM | POA: Diagnosis not present

## 2015-09-16 DIAGNOSIS — E108 Type 1 diabetes mellitus with unspecified complications: Secondary | ICD-10-CM | POA: Diagnosis not present

## 2015-09-16 HISTORY — PX: POSTERIOR CERVICAL FUSION/FORAMINOTOMY: SHX5038

## 2015-09-16 LAB — GLUCOSE, CAPILLARY
GLUCOSE-CAPILLARY: 168 mg/dL — AB (ref 65–99)
GLUCOSE-CAPILLARY: 212 mg/dL — AB (ref 65–99)
GLUCOSE-CAPILLARY: 222 mg/dL — AB (ref 65–99)
GLUCOSE-CAPILLARY: 257 mg/dL — AB (ref 65–99)
GLUCOSE-CAPILLARY: 261 mg/dL — AB (ref 65–99)
GLUCOSE-CAPILLARY: 291 mg/dL — AB (ref 65–99)
GLUCOSE-CAPILLARY: 306 mg/dL — AB (ref 65–99)
GLUCOSE-CAPILLARY: 313 mg/dL — AB (ref 65–99)
GLUCOSE-CAPILLARY: 331 mg/dL — AB (ref 65–99)
GLUCOSE-CAPILLARY: 332 mg/dL — AB (ref 65–99)
Glucose-Capillary: 253 mg/dL — ABNORMAL HIGH (ref 65–99)
Glucose-Capillary: 276 mg/dL — ABNORMAL HIGH (ref 65–99)
Glucose-Capillary: 171 mg/dL — ABNORMAL HIGH (ref 65–99)

## 2015-09-16 SURGERY — POSTERIOR CERVICAL FUSION/FORAMINOTOMY LEVEL 3
Anesthesia: General

## 2015-09-16 MED ORDER — DEXTROSE-NACL 5-0.45 % IV SOLN
INTRAVENOUS | Status: DC
  Administered 2015-09-16: 21:00:00 via INTRAVENOUS

## 2015-09-16 MED ORDER — BUPIVACAINE HCL (PF) 0.5 % IJ SOLN
INTRAMUSCULAR | Status: DC | PRN
  Administered 2015-09-16: 5 mL

## 2015-09-16 MED ORDER — DEXAMETHASONE SODIUM PHOSPHATE 10 MG/ML IJ SOLN
INTRAMUSCULAR | Status: DC | PRN
  Administered 2015-09-16: 10 mg via INTRAVENOUS

## 2015-09-16 MED ORDER — LACTATED RINGERS IV SOLN
INTRAVENOUS | Status: DC | PRN
  Administered 2015-09-16 (×3): via INTRAVENOUS

## 2015-09-16 MED ORDER — INSULIN ASPART 100 UNIT/ML ~~LOC~~ SOLN
SUBCUTANEOUS | Status: AC
  Administered 2015-09-16: 10 [IU]
  Filled 2015-09-16: qty 1

## 2015-09-16 MED ORDER — OXYCODONE-ACETAMINOPHEN 5-325 MG PO TABS
1.0000 | ORAL_TABLET | ORAL | Status: DC | PRN
  Administered 2015-09-16 – 2015-09-21 (×10): 2 via ORAL
  Filled 2015-09-16 (×7): qty 2
  Filled 2015-09-16: qty 1
  Filled 2015-09-16 (×5): qty 2

## 2015-09-16 MED ORDER — 0.9 % SODIUM CHLORIDE (POUR BTL) OPTIME
TOPICAL | Status: DC | PRN
  Administered 2015-09-16: 1000 mL

## 2015-09-16 MED ORDER — ROCURONIUM BROMIDE 100 MG/10ML IV SOLN
INTRAVENOUS | Status: DC | PRN
  Administered 2015-09-16: 50 mg via INTRAVENOUS
  Administered 2015-09-16 (×2): 20 mg via INTRAVENOUS
  Administered 2015-09-16: 10 mg via INTRAVENOUS

## 2015-09-16 MED ORDER — THROMBIN 20000 UNITS EX SOLR
CUTANEOUS | Status: DC | PRN
  Administered 2015-09-16: 10:00:00 via TOPICAL

## 2015-09-16 MED ORDER — LACTATED RINGERS IV SOLN
INTRAVENOUS | Status: DC | PRN
  Administered 2015-09-16 (×2): via INTRAVENOUS

## 2015-09-16 MED ORDER — LABETALOL HCL 5 MG/ML IV SOLN
INTRAVENOUS | Status: DC | PRN
  Administered 2015-09-16: 5 mg via INTRAVENOUS

## 2015-09-16 MED ORDER — LIDOCAINE HCL (CARDIAC) 20 MG/ML IV SOLN
INTRAVENOUS | Status: DC | PRN
  Administered 2015-09-16: 100 mg via INTRATRACHEAL

## 2015-09-16 MED ORDER — LIDOCAINE-EPINEPHRINE 1 %-1:100000 IJ SOLN
INTRAMUSCULAR | Status: DC | PRN
  Administered 2015-09-16: 5 mL

## 2015-09-16 MED ORDER — BACITRACIN ZINC 500 UNIT/GM EX OINT
TOPICAL_OINTMENT | CUTANEOUS | Status: DC | PRN
  Administered 2015-09-16: 1 via TOPICAL

## 2015-09-16 MED ORDER — GLYCOPYRROLATE 0.2 MG/ML IJ SOLN
INTRAMUSCULAR | Status: DC | PRN
  Administered 2015-09-16: 0.6 mg via INTRAVENOUS

## 2015-09-16 MED ORDER — INSULIN REGULAR HUMAN 100 UNIT/ML IJ SOLN
INTRAMUSCULAR | Status: DC
  Filled 2015-09-16: qty 2.5

## 2015-09-16 MED ORDER — PROPOFOL 10 MG/ML IV BOLUS
INTRAVENOUS | Status: AC
  Filled 2015-09-16: qty 40

## 2015-09-16 MED ORDER — ONDANSETRON HCL 4 MG/2ML IJ SOLN
INTRAMUSCULAR | Status: DC | PRN
  Administered 2015-09-16: 4 mg via INTRAVENOUS

## 2015-09-16 MED ORDER — MIDAZOLAM HCL 5 MG/5ML IJ SOLN
INTRAMUSCULAR | Status: DC | PRN
  Administered 2015-09-16: 2 mg via INTRAVENOUS

## 2015-09-16 MED ORDER — NEOSTIGMINE METHYLSULFATE 10 MG/10ML IV SOLN
INTRAVENOUS | Status: DC | PRN
  Administered 2015-09-16: 5 mg via INTRAVENOUS

## 2015-09-16 MED ORDER — DEXTROSE 50 % IV SOLN: 25.0000 mL | INTRAVENOUS | Status: DC | PRN

## 2015-09-16 MED ORDER — MIDAZOLAM HCL 2 MG/2ML IJ SOLN
INTRAMUSCULAR | Status: AC
  Filled 2015-09-16: qty 2

## 2015-09-16 MED ORDER — ONDANSETRON HCL 4 MG/2ML IJ SOLN: 4.0000 mg | Freq: Once | INTRAMUSCULAR | Status: DC

## 2015-09-16 MED ORDER — FENTANYL CITRATE (PF) 250 MCG/5ML IJ SOLN
INTRAMUSCULAR | Status: AC
  Filled 2015-09-16: qty 5

## 2015-09-16 MED ORDER — MEPERIDINE HCL 25 MG/ML IJ SOLN: 6.2500 mg | INTRAMUSCULAR | Status: DC | PRN

## 2015-09-16 MED ORDER — PHENYLEPHRINE HCL 10 MG/ML IJ SOLN
10.0000 mg | INTRAVENOUS | Status: DC | PRN
  Administered 2015-09-16: 10 ug/min via INTRAVENOUS

## 2015-09-16 MED ORDER — FENTANYL CITRATE (PF) 100 MCG/2ML IJ SOLN
25.0000 ug | INTRAMUSCULAR | Status: DC | PRN
  Administered 2015-09-16 (×2): 50 ug via INTRAVENOUS

## 2015-09-16 MED ORDER — EPHEDRINE SULFATE 50 MG/ML IJ SOLN
INTRAMUSCULAR | Status: DC | PRN
  Administered 2015-09-16: 10 mg via INTRAVENOUS
  Administered 2015-09-16: 5 mg via INTRAVENOUS

## 2015-09-16 MED ORDER — SUCCINYLCHOLINE CHLORIDE 20 MG/ML IJ SOLN
INTRAMUSCULAR | Status: DC | PRN
  Administered 2015-09-16: 140 mg via INTRAVENOUS

## 2015-09-16 MED ORDER — SODIUM CHLORIDE 0.9 % IV SOLN: INTRAVENOUS | Status: DC

## 2015-09-16 MED ORDER — INSULIN ASPART 100 UNIT/ML ~~LOC~~ SOLN
8.0000 [IU] | Freq: Once | SUBCUTANEOUS | Status: AC
  Administered 2015-09-16: 8 [IU] via SUBCUTANEOUS

## 2015-09-16 MED ORDER — FENTANYL CITRATE (PF) 100 MCG/2ML IJ SOLN
INTRAMUSCULAR | Status: AC
  Administered 2015-09-16: 50 ug via INTRAVENOUS
  Filled 2015-09-16: qty 2

## 2015-09-16 MED ORDER — INSULIN ASPART 100 UNIT/ML ~~LOC~~ SOLN
SUBCUTANEOUS | Status: AC
  Administered 2015-09-16: 8 [IU] via SUBCUTANEOUS
  Filled 2015-09-16: qty 1

## 2015-09-16 MED ORDER — PROPOFOL 10 MG/ML IV BOLUS
INTRAVENOUS | Status: DC | PRN
  Administered 2015-09-16: 200 mg via INTRAVENOUS

## 2015-09-16 MED ORDER — INSULIN REGULAR BOLUS VIA INFUSION
0.0000 [IU] | Freq: Three times a day (TID) | INTRAVENOUS | Status: DC
  Filled 2015-09-16: qty 10

## 2015-09-16 MED ORDER — FENTANYL CITRATE (PF) 250 MCG/5ML IJ SOLN
INTRAMUSCULAR | Status: DC | PRN
  Administered 2015-09-16: 50 ug via INTRAVENOUS
  Administered 2015-09-16: 100 ug via INTRAVENOUS
  Administered 2015-09-16: 50 ug via INTRAVENOUS
  Administered 2015-09-16: 100 ug via INTRAVENOUS
  Administered 2015-09-16 (×4): 50 ug via INTRAVENOUS

## 2015-09-16 MED ORDER — INSULIN ASPART 100 UNIT/ML ~~LOC~~ SOLN
10.0000 [IU] | Freq: Once | SUBCUTANEOUS | Status: AC
  Administered 2015-09-16: 8 [IU] via SUBCUTANEOUS

## 2015-09-16 MED ORDER — SODIUM CHLORIDE 0.9 % IR SOLN
Status: DC | PRN
  Administered 2015-09-16: 10:00:00

## 2015-09-16 MED ORDER — ESMOLOL HCL 100 MG/10ML IV SOLN
INTRAVENOUS | Status: DC | PRN
  Administered 2015-09-16: 30 mg via INTRAVENOUS
  Administered 2015-09-16: 20 mg via INTRAVENOUS
  Administered 2015-09-16: 30 mg via INTRAVENOUS
  Administered 2015-09-16: 20 mg via INTRAVENOUS

## 2015-09-16 MED ORDER — SODIUM CHLORIDE 0.9 % IV SOLN
INTRAVENOUS | Status: AC
  Administered 2015-09-16: 2.7 [IU]/h via INTRAVENOUS
  Filled 2015-09-16: qty 2.5

## 2015-09-16 MED ORDER — THROMBIN 5000 UNITS EX SOLR
OROMUCOSAL | Status: DC | PRN
  Administered 2015-09-16 (×2): via TOPICAL

## 2015-09-16 SURGICAL SUPPLY — 74 items
BAG DECANTER FOR FLEXI CONT (MISCELLANEOUS) ×2 IMPLANT
BENZOIN TINCTURE PRP APPL 2/3 (GAUZE/BANDAGES/DRESSINGS) ×4 IMPLANT
BIT DRILL 2.4X (BIT) ×1 IMPLANT
BIT DRL 2.4X (BIT) ×1
BLADE CLIPPER SURG (BLADE) ×2 IMPLANT
BLADE ULTRA TIP 2M (BLADE) IMPLANT
BUR MATCHSTICK NEURO 3.0 LAGG (BURR) ×2 IMPLANT
BUR PRECISION FLUTE 5.0 (BURR) ×2 IMPLANT
CANISTER SUCT 3000ML PPV (MISCELLANEOUS) ×2 IMPLANT
CONT SPEC 4OZ CLIKSEAL STRL BL (MISCELLANEOUS) ×4 IMPLANT
DECANTER SPIKE VIAL GLASS SM (MISCELLANEOUS) ×2 IMPLANT
DERMABOND ADVANCED (GAUZE/BANDAGES/DRESSINGS) ×1
DERMABOND ADVANCED .7 DNX12 (GAUZE/BANDAGES/DRESSINGS) ×1 IMPLANT
DRAPE C-ARM 42X72 X-RAY (DRAPES) ×4 IMPLANT
DRAPE LAPAROTOMY 100X72 PEDS (DRAPES) ×2 IMPLANT
DRAPE POUCH INSTRU U-SHP 10X18 (DRAPES) ×2 IMPLANT
DRILL BIT (BIT) ×1
DRSG OPSITE 4X5.5 SM (GAUZE/BANDAGES/DRESSINGS) ×8 IMPLANT
DRSG OPSITE POSTOP 4X6 (GAUZE/BANDAGES/DRESSINGS) ×2 IMPLANT
DURAPREP 6ML APPLICATOR 50/CS (WOUND CARE) ×2 IMPLANT
ELECT REM PT RETURN 9FT ADLT (ELECTROSURGICAL) ×2
ELECTRODE REM PT RTRN 9FT ADLT (ELECTROSURGICAL) ×1 IMPLANT
GAUZE SPONGE 4X4 12PLY STRL (GAUZE/BANDAGES/DRESSINGS) IMPLANT
GAUZE SPONGE 4X4 16PLY XRAY LF (GAUZE/BANDAGES/DRESSINGS) IMPLANT
GLOVE BIOGEL PI IND STRL 7.0 (GLOVE) ×2 IMPLANT
GLOVE BIOGEL PI IND STRL 7.5 (GLOVE) ×4 IMPLANT
GLOVE BIOGEL PI INDICATOR 7.0 (GLOVE) ×2
GLOVE BIOGEL PI INDICATOR 7.5 (GLOVE) ×4
GLOVE ECLIPSE 7.0 STRL STRAW (GLOVE) ×2 IMPLANT
GLOVE EXAM NITRILE LRG STRL (GLOVE) IMPLANT
GLOVE EXAM NITRILE MD LF STRL (GLOVE) IMPLANT
GLOVE EXAM NITRILE XL STR (GLOVE) IMPLANT
GLOVE EXAM NITRILE XS STR PU (GLOVE) IMPLANT
GLOVE SURG SS PI 7.0 STRL IVOR (GLOVE) ×12 IMPLANT
GOWN STRL REUS W/ TWL LRG LVL3 (GOWN DISPOSABLE) IMPLANT
GOWN STRL REUS W/ TWL XL LVL3 (GOWN DISPOSABLE) ×1 IMPLANT
GOWN STRL REUS W/TWL 2XL LVL3 (GOWN DISPOSABLE) IMPLANT
GOWN STRL REUS W/TWL LRG LVL3 (GOWN DISPOSABLE)
GOWN STRL REUS W/TWL XL LVL3 (GOWN DISPOSABLE) ×1
HEMOSTAT POWDER SURGIFOAM 1G (HEMOSTASIS) ×4 IMPLANT
KIT BASIN OR (CUSTOM PROCEDURE TRAY) ×2 IMPLANT
KIT INFUSE SMALL (Orthopedic Implant) ×2 IMPLANT
KIT ROOM TURNOVER OR (KITS) ×2 IMPLANT
MILL MEDIUM DISP (BLADE) ×2 IMPLANT
NEEDLE HYPO 25X1 1.5 SAFETY (NEEDLE) ×2 IMPLANT
NS IRRIG 1000ML POUR BTL (IV SOLUTION) ×2 IMPLANT
PACK LAMINECTOMY NEURO (CUSTOM PROCEDURE TRAY) ×2 IMPLANT
PAD ARMBOARD 7.5X6 YLW CONV (MISCELLANEOUS) ×8 IMPLANT
PATTIES SURGICAL .5 X.5 (GAUZE/BANDAGES/DRESSINGS) ×2 IMPLANT
PIN MAYFIELD SKULL DISP (PIN) ×2 IMPLANT
ROD VERTEX 60MM (Rod) ×4 IMPLANT
SCREW 3.5X12 (Screw) ×1 IMPLANT
SCREW 4.0X20 (Screw) ×2 IMPLANT
SCREW 4.5X24 (Screw) ×2 IMPLANT
SCREW BN 12X3.5XMA SPNE (Screw) ×1 IMPLANT
SCREW CANCELLOUS 3.5X14MM (Screw) ×10 IMPLANT
SCREW SET M6 (Screw) ×16 IMPLANT
SCRUB FOAM SURGICAL 12 800ML (MISCELLANEOUS) ×2 IMPLANT
SEALER BIPOLAR AQUA 6.0 (INSTRUMENTS) ×2 IMPLANT
SPONGE LAP 4X18 X RAY DECT (DISPOSABLE) ×2 IMPLANT
SPONGE SURGIFOAM ABS GEL 100 (HEMOSTASIS) ×2 IMPLANT
STAPLER SKIN PROX WIDE 3.9 (STAPLE) ×2 IMPLANT
STRIP CLOSURE SKIN 1/2X4 (GAUZE/BANDAGES/DRESSINGS) IMPLANT
SUT ETHILON 3 0 FSL (SUTURE) IMPLANT
SUT VIC AB 0 CT1 18XCR BRD8 (SUTURE) ×2 IMPLANT
SUT VIC AB 0 CT1 8-18 (SUTURE) ×2
SUT VIC AB 2-0 CT1 18 (SUTURE) ×2 IMPLANT
SUT VICRYL 3-0 RB1 18 ABS (SUTURE) ×6 IMPLANT
TOWEL OR 17X24 6PK STRL BLUE (TOWEL DISPOSABLE) ×2 IMPLANT
TOWEL OR 17X26 10 PK STRL BLUE (TOWEL DISPOSABLE) ×2 IMPLANT
TRAP SPECIMEN MUCOUS 40CC (MISCELLANEOUS) ×2 IMPLANT
TRAY FOLEY CATH SILVER 16FR LF (SET/KITS/TRAYS/PACK) ×2 IMPLANT
UNDERPAD 30X30 INCONTINENT (UNDERPADS AND DIAPERS) IMPLANT
WATER STERILE IRR 1000ML POUR (IV SOLUTION) ×2 IMPLANT

## 2015-09-16 NOTE — Anesthesia Procedure Notes (Signed)
Procedure Name: Intubation Date/Time: 09/16/2015 8:23 AM Performed by: Maude Leriche D Pre-anesthesia Checklist: Patient identified, Emergency Drugs available, Suction available, Patient being monitored and Timeout performed Patient Re-evaluated:Patient Re-evaluated prior to inductionOxygen Delivery Method: Circle system utilized Preoxygenation: Pre-oxygenation with 100% oxygen Intubation Type: IV induction Ventilation: Mask ventilation without difficulty and Oral airway inserted - appropriate to patient size Laryngoscope Size: Glidescope (Elective Glide) Grade View: Grade I Tube type: Oral Tube size: 8.0 mm Number of attempts: 1 Airway Equipment and Method: Stylet and Video-laryngoscopy Placement Confirmation: ETT inserted through vocal cords under direct vision,  positive ETCO2 and breath sounds checked- equal and bilateral Secured at: 22 cm Tube secured with: Tape Dental Injury: Teeth and Oropharynx as per pre-operative assessment

## 2015-09-16 NOTE — Transfer of Care (Signed)
Immediate Anesthesia Transfer of Care Note  Patient: Diana Henderson  Procedure(s) Performed: Procedure(s) with comments: Cervical four - seven laminectomies with lateral mass fusion (N/A) - C4-7 laminectomy with lateral mass fusion  Patient Location: PACU  Anesthesia Type:General  Level of Consciousness: sedated  Airway & Oxygen Therapy: Patient Spontanous Breathing and Patient connected to face mask oxygen  Post-op Assessment: Report given to RN and Post -op Vital signs reviewed and stable  Post vital signs: Reviewed and stable  Last Vitals:  Filed Vitals:   09/16/15 0616  BP: 146/79  Pulse: 87  Temp: 37.1 C  Resp: 18    Complications: No apparent anesthesia complications

## 2015-09-16 NOTE — Op Note (Signed)
PREOP DIAGNOSIS:  1. 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 C4-7 laminectomy for decompression of spinal cord 2. Posterior segmental instrumentation, C4-6 lateral mass screws, C7 pedicle screws ([employer]) 3. Posterolateral arthrodesis, C4-5, C5-6, C6-7 4. Use of locally harvested bone autograft 5. Use of BMP  SURGEON: Dr. Consuella Lose, MD  ASSISTANT: Dr. Cyndy Freeze, MD  ANESTHESIA: General Endotracheal  EBL: 500cc  SPECIMENS: None  DRAINS: None  COMPLICATIONS: None immediate  CONDITION: Hemodynamically stable to PACU  HISTORY: Diana Henderson is a 45 y.o. female who previously underwent C5-6 ACDF for stenosis with myelopathy. She continued to have arm pain and left sided arm weakness. MRI demonstrated continued stenosis. She therefore elected to proceed with posterior decompression and stabilization.  PROCEDURE IN DETAIL: After informed consent was obtained and witnessed, the patient was brought to the operating room. After induction of general anesthesia, the patient was positioned on the operative table in the prone position in the [place]. All pressure points were meticulously padded. Skin incision was then marked out and prepped and draped in the usual sterile fashion.  After timeout was conducted, incision was infiltrated with local anesthetic with epinephrine. Incision was then made sharply, and Bovie electrocautery was used to dissect the subcutaneous tissue until then nuchal fascia was identified and incised in the midline. Suboccipital musculature was then divided in the avascular midline plane. The spinous process at C3, C4, C5, C6, and C7 were identified, and subperiosteal dissection along the lamina bilaterally was carried out. Some retaining retractors were placed. A towel clip was placed on the bottom of the C3 spinous process, and lateral fluoroscopy was used to confirm our location at the correct level.  At  this point, pilot holes for lateral mass screws at C4, C5, and C6 were drilled, and tapped to 3.5 x 14 mm. Complete laminectomy was then completed using a combination of Kerrison rongeurs and high-speed drill at C4, C5, C6, and C7. The thecal sac was noted to be under significant pressure, and moved significantly dorsally after completion of laminectomy.  Having completed decompression, during which the bone was harvested and morcellized, attention was turned to C7 instrumentation. The C7 pedicles were identified bilaterally, cortex was punctured, and pilot hole was drilled and tapped to 4.0 x 20 mm.  At this point, the exposed lateral masses and facet complexes were decorticated in preparation for arthrodesis. C7 pedicle screws were then placed, as were C4, C5, and C6 lateral mass screws. 50 mm rod was then placed, set screws placed and final tightened. The lateral masses were then covered with BMP, and the morcellized bone autograft harvested during the decompression. Prior to placement of the bone graft and BMP, the wound was irrigated with copious amounts of normal saline irrigation.  The wound was then closed in multiple layers in standard fashion using a combination of 0 and 3-0 Vicryl stitches. Skin was closed with Dermabond, and sterile dressing was applied. The patient was then transferred to the stretcher, [place] head holder was removed, and she was extubated, and taken to the postanesthesia care unit in stable hemodynamic condition. At the end of the case all sponge, needle, instrument, and cottonoid counts were correct.

## 2015-09-16 NOTE — H&P (Signed)
CC:  Neck and arm pain  HPI: Diana Henderson is a 45 year old woman I'm seeing in follow-up. She previously underwent C5 C6 ACDF about 9 months ago for a calcified disc herniation, initially presenting with left arm and hand numbness and weakness. She unfortunately did not have significant improvement in her left arm symptoms postoperatively. She continues to complain of left-sided arm numbness and weakness. She has recently undergone follow-up CT scan and MRI of the neck  PMH: Past Medical History  Diagnosis Date  . Diabetes mellitus   . Hypertension   . Hyperlipidemia   . Depression   . PTSD (post-traumatic stress disorder)   . Pain in joint, ankle and foot 02/17/2013  . Anxiety   . Bipolar 1 disorder ([place])   . Sleep apnea     uses CPAP intermittently, pt. doesn't remember where she had the study  . Asthma     has been eval. by Dr. Gwenette Greet- last 06/2014  . Pneumonia     hosp. for a couple of day- not sure when it was   . GERD (gastroesophageal reflux disease)   . Headache     sometimes has migraines   . Arthritis     osteoarthritis   . OSA on CPAP   . Knee pain, chronic   . Shortness of breath dyspnea   . Complication of anesthesia     09/13/14: malignant HTN with inducction, case cx but no sourse identified; 11/05/14 remifentanyl infusion used to attenuate hypergynamic induction    PSH: Past Surgical History  Procedure Laterality Date  . Tubal ligation    . Midtarsal arthrodesis Right 07/17/12    Rt foot  . Remove implant deep Right 07/17/12    Rt foot  . Lapidus fusion Right 01/10/12    Rt foot  . Carpal tunnel release Left 2001    Left wrist  . Gastroc recession extremity Right 3//20/14    Right foot  . Hernia repair      1996  . Remove implant deep Right 07/22/2014    Rt foot @ [facility]  . Cotton osteotomy w/ bone graft Right 09/30/2014    Rt foot @ [facility]  . Anterior cervical decomp/discectomy fusion N/A 11/05/2014    Procedure: ANTERIOR CERVICAL DECOMPRESSION/DISCECTOMY FUSION  1 LEVEL;  Surgeon: Consuella Lose, MD;  Location: [place] [facility];  Service: Neurosurgery;  Laterality: N/A;  Cervical five- cervical seven anterior cervical decompression with fusion interbody prosthesis plating and bonegraft    SH: Social History  Substance Use Topics  . Smoking status: Former Smoker -- 2.00 packs/day for 6 years    Types: Cigarettes    Quit date: 06/18/1978  . Smokeless tobacco: Never Used  . Alcohol Use: 0.0 oz/week    0 Standard drinks or equivalent per week     Comment: weekend- use of beer     MEDS: Prior to Admission medications   Medication Sig Start Date End Date Taking? Authorizing Provider  albuterol (PROVENTIL HFA;VENTOLIN HFA) 108 (90 BASE) MCG/ACT inhaler Inhale 1-2 puffs into the lungs every 6 (six) hours as needed for wheezing or shortness of breath. 04/07/15  Yes Chesley Noon Nadeau, PA-C  amLODipine (NORVASC) 10 MG tablet Take 1 tablet (10 mg total) by mouth daily. 09/10/14  Yes Eugenie Filler, MD  atenolol (TENORMIN) 50 MG tablet Take 1 tablet (50 mg total) by mouth daily. 09/10/14  Yes Eugenie Filler, MD  beclomethasone (QVAR) 80 MCG/ACT inhaler Inhale 1 puff into the lungs 2 (two) times  daily.   Yes Historical Provider, MD  budesonide-formoterol (SYMBICORT) 160-4.5 MCG/ACT inhaler Inhale 2 puffs into the lungs 2 (two) times daily.   Yes Historical Provider, MD  DULoxetine (CYMBALTA) 60 MG capsule Take 60 mg by mouth at bedtime.    Yes Historical Provider, MD  FARXIGA 10 MG TABS tablet Take 10 mg by mouth daily. 08/22/15  Yes Historical Provider, MD  fluticasone (FLONASE) 50 MCG/ACT nasal spray Place 1 spray into both nostrils 2 (two) times daily. 04/07/15  Yes Nona Dell, PA-C  gabapentin (NEURONTIN) 600 MG tablet Take 600 mg by mouth 3 (three) times daily.   Yes Historical Provider, MD  hydrOXYzine (ATARAX/VISTARIL) 50 MG tablet Take 50 mg by mouth 3 (three) times daily as needed for anxiety.  10/22/14  Yes Historical Provider, MD   insulin NPH-regular Human (NOVOLIN 70/30) (70-30) 100 UNIT/ML injection Inject 30 Units into the skin 2 (two) times daily with a meal.   Yes Historical Provider, MD  INVOKANA 300 MG TABS tablet Take 300 mg by mouth daily. 09/18/14  Yes Historical Provider, MD  JANUMET XR 50-1000 MG TB24 Take 1 tablet by mouth daily. 09/18/14  Yes Historical Provider, MD  lactulose (CHRONULAC) 10 GM/15ML solution Take 45 mLs (30 g total) by mouth 3 (three) times daily. 09/10/14  Yes Eugenie Filler, MD  mometasone (NASONEX) 50 MCG/ACT nasal spray Place 2 sprays into the nose at bedtime. 04/07/15  Yes Rigoberto Noel, MD  Multiple Vitamin (MULTIVITAMIN) tablet Take 1 tablet by mouth daily. Reported on 09/12/2015   Yes Historical Provider, MD  nabumetone (RELAFEN) 500 MG tablet Take 1 tablet (500 mg total) by mouth 2 (two) times daily. 07/25/15  Yes Myeong O Sheard, DPM  olmesartan (BENICAR) 20 MG tablet Take 20 mg by mouth daily.   Yes Historical Provider, MD  omeprazole (PRILOSEC) 20 MG capsule Take 20 mg by mouth daily.    Yes Historical Provider, MD  ondansetron (ZOFRAN ODT) 8 MG disintegrating tablet Take 1 tablet (8 mg total) by mouth every 8 (eight) hours as needed for nausea or vomiting. 01/28/14  Yes Sherwood Gambler, MD  oxyCODONE-acetaminophen (PERCOCET) 10-325 MG tablet Take 1 tablet by mouth every 8 (eight) hours as needed for pain. 08/29/15  Yes Myeong O Sheard, DPM  Psyllium (METAMUCIL PO) Take 1 scoop by mouth daily as needed (for constipation).    Yes Historical Provider, MD  risperiDONE (RISPERDAL) 0.5 MG tablet Take 0.5-1 mg by mouth 2 (two) times daily. Take 0.5 mg in the morning and take 1 mg in the evening 10/22/14  Yes Historical Provider, MD  tiZANidine (ZANAFLEX) 4 MG tablet Take 1 tablet (4 mg total) by mouth every 8 (eight) hours as needed for muscle spasms. 09/10/14  Yes Eugenie Filler, MD  trimethoprim-polymyxin b (POLYTRIM) ophthalmic solution Place 1 drop into the left eye every 4 (four) hours. 02/19/14   Yes Donne Hazel, MD    ALLERGY: Allergies  Allergen Reactions  . Celery Oil Hives  . Pork-Derived Products Nausea And Vomiting and Other (See Comments)    headache  . Latex Itching    Powdered latex gloves when patient wears.      ROS: ROS  NEUROLOGIC EXAM: Awake, alert, oriented Memory and concentration grossly intact Speech fluent, appropriate CN grossly intact Motor exam: Upper Extremities Deltoid Bicep Tricep Grip  Right 5/5 5/5 5/5 5/5  Left 4+/5 4+/5 4+/5 4/5   Lower Extremity IP Quad PF DF EHL  Right 5/5 5/5 5/5  5/5 5/5  Left 5/5 5/5 5/5 5/5 5/5   Sensation grossly intact to LT  [facility]: CT scan and MRI of the neck were reviewed. These do demonstrate good fusion at C5 C6. Unfortunately, there is residual stenosis, although somewhat improved, related to likely calcified posterior longitudinal ligament behind the body of C5 and C6. There is continued marked spinal stenosis.  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 She therefore does require more decompression. Unfortunately, repeat surgery from an anterior approach would require 2 level corpectomy. In this woman, redo anterior approach I think carries a relatively high risk of morbidity. Will therefore decompress posteriorly  PLAN: posterior decompression via C4 to C7 laminectomy, and lateral mass fusion.   I did review the MRI findings with the patient. The need for repeat decompressive surgery was reviewed. Various treatment strategies were discussed. We discussed the risks of posterior surgery including spinal cord injury, CSF leak, bleeding, and infection. The patient and her daughter understood our discussion. All questions were answered. They're willing to proceed with surgery.

## 2015-09-16 NOTE — Care Management Note (Signed)
Case Management Note  Patient Details  Name: Diana Henderson MRN: [id number] Date of Birth: December 20, 1970  Subjective/Objective:                    Action/Plan: Patient was admitted for a Cervical four - seven laminectomies with lateral mass fusion.  Admitted from home. Will follow for discharge needs pending PT/OT evals and physician orders.  Expected Discharge Date:                  Expected Discharge Plan:     In-House Referral:     Discharge planning Services     Post Acute Care Choice:    Choice offered to:     DME Arranged:    DME Agency:     HH Arranged:    HH Agency:     Status of Service:  In process, will continue to follow  Medicare Important Message Given:    Date Medicare IM Given:    Medicare IM give by:    Date Additional Medicare IM Given:    Additional Medicare Important Message give by:     If discussed at [place] of Stay Meetings, dates discussed:    Additional Comments:  Rolm Baptise, RN 09/16/2015, 3:54 PM [phone]

## 2015-09-16 NOTE — Anesthesia Postprocedure Evaluation (Signed)
Anesthesia Post Note  Patient: [name]  Procedure(s) Performed: Procedure(s) (LRB): Cervical four - seven laminectomies with lateral mass fusion (N/A)  Patient location during evaluation: PACU Anesthesia Type: General Level of consciousness: awake and alert Pain management: pain level controlled Vital Signs Assessment: post-procedure vital signs reviewed and stable Respiratory status: spontaneous breathing, nonlabored ventilation, respiratory function stable and patient connected to nasal cannula oxygen Cardiovascular status: blood pressure returned to baseline and stable Postop Assessment: no signs of nausea or vomiting Anesthetic complications: no Comments: Blood sugar improving, will evaluate again in 30 minutes.  More Insulin ordered     Last Vitals:  Filed Vitals:   09/16/15 1250 09/16/15 1305  BP: 154/97 158/95  Pulse: 87 82  Temp:    Resp: 32 22    Last Pain: There were no vitals filed for this visit.               [name]

## 2015-09-16 NOTE — Progress Notes (Signed)
Orthopedic Tech Progress Note Patient Details:  Diana Henderson [birth year] [id number]  Ortho Devices Type of Ortho Device: Soft collar Ortho Device/Splint Interventions: Application   [place] Pink 09/16/2015, 12:55 PM

## 2015-09-17 LAB — GLUCOSE, CAPILLARY
GLUCOSE-CAPILLARY: 142 mg/dL — AB (ref 65–99)
GLUCOSE-CAPILLARY: 188 mg/dL — AB (ref 65–99)
GLUCOSE-CAPILLARY: 221 mg/dL — AB (ref 65–99)
GLUCOSE-CAPILLARY: 236 mg/dL — AB (ref 65–99)
Glucose-Capillary: 139 mg/dL — ABNORMAL HIGH (ref 65–99)
Glucose-Capillary: 143 mg/dL — ABNORMAL HIGH (ref 65–99)
Glucose-Capillary: 155 mg/dL — ABNORMAL HIGH (ref 65–99)
Glucose-Capillary: 225 mg/dL — ABNORMAL HIGH (ref 65–99)
Glucose-Capillary: 237 mg/dL — ABNORMAL HIGH (ref 65–99)

## 2015-09-17 LAB — BASIC METABOLIC PANEL
Anion gap: 12 (ref 5–15)
BUN: 16 mg/dL (ref 6–20)
CHLORIDE: 96 mmol/L — AB (ref 101–111)
CO2: 23 mmol/L (ref 22–32)
CREATININE: 1.76 mg/dL — AB (ref 0.44–1.00)
Calcium: 8.6 mg/dL — ABNORMAL LOW (ref 8.9–10.3)
GFR calc non Af Amer: 34 mL/min — ABNORMAL LOW (ref >60)
GFR, EST AFRICAN AMERICAN: 39 mL/min — AB (ref >60)
Glucose, Bld: 262 mg/dL — ABNORMAL HIGH (ref 65–99)
POTASSIUM: 4.8 mmol/L (ref 3.5–5.1)
SODIUM: 131 mmol/L — AB (ref 135–145)

## 2015-09-17 MED ORDER — HYDROXYZINE HCL 25 MG PO TABS: 50.0000 mg | ORAL_TABLET | Freq: Three times a day (TID) | ORAL | Status: DC | PRN

## 2015-09-17 MED ORDER — BUDESONIDE 0.25 MG/2ML IN SUSP
0.2500 mg | Freq: Two times a day (BID) | RESPIRATORY_TRACT | Status: DC
  Administered 2015-09-17 – 2015-09-23 (×11): 0.25 mg via RESPIRATORY_TRACT
  Filled 2015-09-17 (×14): qty 2

## 2015-09-17 MED ORDER — POLYMYXIN B-TRIMETHOPRIM 10000-0.1 UNIT/ML-% OP SOLN
1.0000 [drp] | OPHTHALMIC | Status: DC
  Administered 2015-09-17 – 2015-09-23 (×28): 1 [drp] via OPHTHALMIC
  Filled 2015-09-17: qty 10

## 2015-09-17 MED ORDER — DEXTROSE-NACL 5-0.45 % IV SOLN
INTRAVENOUS | Status: DC
Start: 2015-09-17 — End: 2015-09-18

## 2015-09-17 MED ORDER — ACETAMINOPHEN 325 MG PO TABS
650.0000 mg | ORAL_TABLET | ORAL | Status: DC | PRN
  Administered 2015-09-22 – 2015-09-23 (×5): 650 mg via ORAL
  Filled 2015-09-17 (×7): qty 2

## 2015-09-17 MED ORDER — DULOXETINE HCL 60 MG PO CPEP
60.0000 mg | ORAL_CAPSULE | Freq: Every day | ORAL | Status: DC
  Administered 2015-09-17 – 2015-09-18 (×2): 60 mg via ORAL
  Filled 2015-09-17 (×2): qty 1

## 2015-09-17 MED ORDER — ATENOLOL 25 MG PO TABS
50.0000 mg | ORAL_TABLET | Freq: Every day | ORAL | Status: DC
  Administered 2015-09-17: 50 mg via ORAL
  Filled 2015-09-17: qty 2

## 2015-09-17 MED ORDER — PANTOPRAZOLE SODIUM 40 MG PO TBEC
40.0000 mg | DELAYED_RELEASE_TABLET | Freq: Every day | ORAL | Status: DC
Start: 2015-09-17 — End: 2015-09-23
  Administered 2015-09-17 – 2015-09-23 (×6): 40 mg via ORAL
  Filled 2015-09-17 (×6): qty 1

## 2015-09-17 MED ORDER — POLYETHYLENE GLYCOL 3350 17 G PO PACK: 17.0000 g | PACK | Freq: Every day | ORAL | Status: DC | PRN

## 2015-09-17 MED ORDER — SODIUM CHLORIDE 0.9 % IV SOLN: INTRAVENOUS | Status: DC

## 2015-09-17 MED ORDER — INSULIN ASPART 100 UNIT/ML ~~LOC~~ SOLN
0.0000 [IU] | Freq: Three times a day (TID) | SUBCUTANEOUS | Status: DC
Start: 2015-09-17 — End: 2015-09-18
  Administered 2015-09-17: 7 [IU] via SUBCUTANEOUS

## 2015-09-17 MED ORDER — HYDROMORPHONE HCL 1 MG/ML IJ SOLN
0.5000 mg | INTRAMUSCULAR | Status: DC | PRN
  Administered 2015-09-20 (×3): 1 mg via INTRAVENOUS
  Filled 2015-09-17 (×3): qty 1

## 2015-09-17 MED ORDER — SODIUM CHLORIDE 0.9% FLUSH: 3.0000 mL | Freq: Two times a day (BID) | INTRAVENOUS | Status: DC

## 2015-09-17 MED ORDER — INSULIN ASPART 100 UNIT/ML ~~LOC~~ SOLN
0.0000 [IU] | SUBCUTANEOUS | Status: DC
  Administered 2015-09-17: 3 [IU] via SUBCUTANEOUS
  Administered 2015-09-17 (×2): 7 [IU] via SUBCUTANEOUS
  Administered 2015-09-17 – 2015-09-18 (×2): 4 [IU] via SUBCUTANEOUS
  Administered 2015-09-18 (×2): 7 [IU] via SUBCUTANEOUS

## 2015-09-17 MED ORDER — LINAGLIPTIN 5 MG PO TABS
5.0000 mg | ORAL_TABLET | Freq: Every day | ORAL | Status: DC
  Administered 2015-09-18 – 2015-09-23 (×6): 5 mg via ORAL
  Filled 2015-09-17 (×6): qty 1

## 2015-09-17 MED ORDER — NABUMETONE 500 MG PO TABS
500.0000 mg | ORAL_TABLET | Freq: Two times a day (BID) | ORAL | Status: DC
  Administered 2015-09-17 – 2015-09-19 (×3): 500 mg via ORAL
  Filled 2015-09-17 (×7): qty 1

## 2015-09-17 MED ORDER — ALBUTEROL SULFATE (2.5 MG/3ML) 0.083% IN NEBU: 2.5000 mg | INHALATION_SOLUTION | Freq: Four times a day (QID) | RESPIRATORY_TRACT | Status: DC | PRN

## 2015-09-17 MED ORDER — SODIUM CHLORIDE 0.9 % IV SOLN: 250.0000 mL | INTRAVENOUS | Status: DC

## 2015-09-17 MED ORDER — ONDANSETRON HCL 4 MG/2ML IJ SOLN: 4.0000 mg | INTRAMUSCULAR | Status: DC | PRN

## 2015-09-17 MED ORDER — ADULT MULTIVITAMIN W/MINERALS CH
1.0000 | ORAL_TABLET | Freq: Every day | ORAL | Status: DC
  Administered 2015-09-17 – 2015-09-23 (×6): 1 via ORAL
  Filled 2015-09-17 (×6): qty 1

## 2015-09-17 MED ORDER — INSULIN ASPART PROT & ASPART (70-30 MIX) 100 UNIT/ML ~~LOC~~ SUSP
30.0000 [IU] | Freq: Two times a day (BID) | SUBCUTANEOUS | Status: DC
  Administered 2015-09-17 – 2015-09-18 (×2): 30 [IU] via SUBCUTANEOUS
  Filled 2015-09-17: qty 10

## 2015-09-17 MED ORDER — GABAPENTIN 600 MG PO TABS
600.0000 mg | ORAL_TABLET | Freq: Three times a day (TID) | ORAL | Status: DC
Start: 2015-09-17 — End: 2015-09-19
  Administered 2015-09-17 – 2015-09-19 (×5): 600 mg via ORAL
  Filled 2015-09-17 (×5): qty 1

## 2015-09-17 MED ORDER — SITAGLIP PHOS-METFORMIN HCL ER 50-1000 MG PO TB24: 1.0000 | ORAL_TABLET | Freq: Every day | ORAL | Status: DC

## 2015-09-17 MED ORDER — DOCUSATE SODIUM 100 MG PO CAPS
100.0000 mg | ORAL_CAPSULE | Freq: Two times a day (BID) | ORAL | Status: DC
  Administered 2015-09-17 – 2015-09-22 (×7): 100 mg via ORAL
  Filled 2015-09-17 (×10): qty 1

## 2015-09-17 MED ORDER — SODIUM CHLORIDE 0.9% FLUSH: 3.0000 mL | INTRAVENOUS | Status: DC | PRN

## 2015-09-17 MED ORDER — MENTHOL 3 MG MT LOZG
1.0000 | LOZENGE | OROMUCOSAL | Status: DC | PRN
  Administered 2015-09-18: 3 mg via ORAL
  Filled 2015-09-17: qty 9

## 2015-09-17 MED ORDER — FLUTICASONE PROPIONATE 50 MCG/ACT NA SUSP
1.0000 | Freq: Two times a day (BID) | NASAL | Status: DC
  Administered 2015-09-17 – 2015-09-22 (×10): 1 via NASAL
  Filled 2015-09-17 (×3): qty 16

## 2015-09-17 MED ORDER — IRBESARTAN 150 MG PO TABS
150.0000 mg | ORAL_TABLET | Freq: Every day | ORAL | Status: DC
  Administered 2015-09-17: 150 mg via ORAL
  Filled 2015-09-17: qty 1

## 2015-09-17 MED ORDER — LACTULOSE 10 GM/15ML PO SOLN
30.0000 g | Freq: Three times a day (TID) | ORAL | Status: DC
  Administered 2015-09-17 – 2015-09-21 (×4): 30 g via ORAL
  Filled 2015-09-17 (×10): qty 45

## 2015-09-17 MED ORDER — SENNA 8.6 MG PO TABS
1.0000 | ORAL_TABLET | Freq: Two times a day (BID) | ORAL | Status: DC
  Administered 2015-09-17 – 2015-09-22 (×5): 8.6 mg via ORAL
  Filled 2015-09-17 (×6): qty 1

## 2015-09-17 MED ORDER — CANAGLIFLOZIN 300 MG PO TABS
300.0000 mg | ORAL_TABLET | Freq: Every day | ORAL | Status: DC
  Filled 2015-09-17: qty 1

## 2015-09-17 MED ORDER — AMLODIPINE BESYLATE 10 MG PO TABS
10.0000 mg | ORAL_TABLET | Freq: Every day | ORAL | Status: DC
  Administered 2015-09-17: 10 mg via ORAL
  Filled 2015-09-17: qty 1

## 2015-09-17 MED ORDER — CEFAZOLIN SODIUM-DEXTROSE 2-4 GM/100ML-% IV SOLN
2.0000 g | Freq: Three times a day (TID) | INTRAVENOUS | Status: AC
  Administered 2015-09-17 (×2): 2 g via INTRAVENOUS
  Filled 2015-09-17 (×2): qty 100

## 2015-09-17 MED ORDER — INSULIN REGULAR BOLUS VIA INFUSION: 0.0000 [IU] | Freq: Three times a day (TID) | INTRAVENOUS | Status: DC

## 2015-09-17 MED ORDER — TIZANIDINE HCL 4 MG PO TABS
4.0000 mg | ORAL_TABLET | Freq: Three times a day (TID) | ORAL | Status: DC | PRN
  Administered 2015-09-18: 4 mg via ORAL
  Filled 2015-09-17: qty 1

## 2015-09-17 MED ORDER — PSYLLIUM 95 % PO PACK
1.0000 | PACK | Freq: Every day | ORAL | Status: DC
  Administered 2015-09-17 – 2015-09-21 (×2): 1 via ORAL
  Filled 2015-09-17 (×7): qty 1

## 2015-09-17 MED ORDER — RISPERIDONE 0.5 MG PO TABS
0.5000 mg | ORAL_TABLET | Freq: Every day | ORAL | Status: DC
  Administered 2015-09-19 – 2015-09-21 (×3): 0.5 mg via ORAL
  Filled 2015-09-17 (×4): qty 1

## 2015-09-17 MED ORDER — MOMETASONE FURO-FORMOTEROL FUM 200-5 MCG/ACT IN AERO
2.0000 | INHALATION_SPRAY | Freq: Two times a day (BID) | RESPIRATORY_TRACT | Status: DC
Start: 2015-09-17 — End: 2015-09-23
  Administered 2015-09-18 – 2015-09-23 (×9): 2 via RESPIRATORY_TRACT
  Filled 2015-09-17 (×2): qty 8.8

## 2015-09-17 MED ORDER — CANAGLIFLOZIN 100 MG PO TABS: 100.0000 mg | ORAL_TABLET | Freq: Every day | ORAL | Status: DC

## 2015-09-17 MED ORDER — PHENOL 1.4 % MT LIQD: 1.0000 | OROMUCOSAL | Status: DC | PRN

## 2015-09-17 MED ORDER — METFORMIN HCL ER 500 MG PO TB24
1000.0000 mg | ORAL_TABLET | Freq: Every day | ORAL | Status: DC
  Administered 2015-09-18: 1000 mg via ORAL
  Filled 2015-09-17 (×2): qty 2

## 2015-09-17 MED ORDER — DEXTROSE 50 % IV SOLN: 25.0000 mL | INTRAVENOUS | Status: DC | PRN

## 2015-09-17 MED ORDER — RISPERIDONE 0.5 MG PO TABS
1.0000 mg | ORAL_TABLET | Freq: Every day | ORAL | Status: DC
  Administered 2015-09-17 – 2015-09-20 (×4): 1 mg via ORAL
  Filled 2015-09-17 (×4): qty 2

## 2015-09-17 MED ORDER — INSULIN ASPART 100 UNIT/ML ~~LOC~~ SOLN: 0.0000 [IU] | SUBCUTANEOUS | Status: DC

## 2015-09-17 MED ORDER — ONDANSETRON 4 MG PO TBDP: 8.0000 mg | ORAL_TABLET | Freq: Three times a day (TID) | ORAL | Status: DC | PRN

## 2015-09-17 MED ORDER — ACETAMINOPHEN 650 MG RE SUPP: 650.0000 mg | RECTAL | Status: DC | PRN

## 2015-09-17 MED ORDER — FLUTICASONE PROPIONATE 50 MCG/ACT NA SUSP: 1.0000 | Freq: Every day | NASAL | Status: DC

## 2015-09-17 NOTE — Progress Notes (Signed)
Pt ambulated from the chair to the bed using rolling walker assist x2.  Will attempt to ambulated this later per pt request. Safety measures in place. Call bell within reach. Will continue to monitor.

## 2015-09-17 NOTE — Progress Notes (Signed)
Pt refused all daily medication except for pain medication at this time. Pt alert, verbal with no noted distress. Will continue to monitor.

## 2015-09-17 NOTE — Progress Notes (Signed)
Subjective: Patient reports quite painful in back of neck.  Objective: Vital signs in last 24 hours: Temp:  [97.2 F (36.2 C)-98.5 F (36.9 C)] 98.5 F (36.9 C) (04/01 0516) Pulse Rate:  [81-116] 93 (04/01 0516) Resp:  [19-32] 20 (04/01 0516) BP: (130-161)/(72-99) 155/88 mmHg (04/01 0516) SpO2:  [98 %-100 %] 98 % (04/01 0516)  Intake/Output from previous day: 03/31 0701 - 04/01 0700 In: 3000 [I.V.:3000] Out: 3850 [Urine:3350; Blood:500] Intake/Output this shift:    Physical Exam: Pain limited exam.  Left upper extremity weakness persists, appears stable from preop.  Dressing CDI.  Lab Results:  Recent Labs  09/14/15 1721  WBC 5.7  HGB 13.6  HCT 42.4  PLT 177   BMET  Recent Labs  09/14/15 1721  NA 132*  K 4.2  CL 99*  CO2 21*  GLUCOSE 333*  BUN 11  CREATININE 1.03*  CALCIUM 8.9    Studies/Results: Dg Cervical Spine 1 View  09/16/2015  CLINICAL DATA:  C4-7 posterior fusion. EXAM: CERVICAL SPINE 1 VIEW COMPARISON:  MRI from 08/17/2015. FINDINGS: A single spot fluoro images obtained at 1135 hours. Anterior cervical plate is visualized with bilateral lateral mass screws and fusion rods. IMPRESSION: Intraoperative localization. Electronically Signed   By: Misty [place] M.D.   On: 09/16/2015 12:05   Dg C-arm 1-60 Min  09/16/2015  CLINICAL DATA:  Intraoperative localization. EXAM: DG C-ARM 61-120 MIN COMPARISON:  None. FINDINGS: Frontal view the cervical spine obtained during posterior cervical fusion shows bilateral lateral mass screws with fusion rods. There is an anterior cervical plate in situ. IMPRESSION: Intraoperative localization. Electronically Signed   By: Misty [place] M.D.   On: 09/16/2015 12:11    Assessment/Plan: POD 1.  Mobilize with PT.    LOS: 1 day    Peggyann Shoals, MD 09/17/2015, 7:41 AM

## 2015-09-18 DIAGNOSIS — I5032 Chronic diastolic (congestive) heart failure: Secondary | ICD-10-CM | POA: Diagnosis present

## 2015-09-18 DIAGNOSIS — E785 Hyperlipidemia, unspecified: Secondary | ICD-10-CM

## 2015-09-18 DIAGNOSIS — G4733 Obstructive sleep apnea (adult) (pediatric): Secondary | ICD-10-CM

## 2015-09-18 DIAGNOSIS — E1165 Type 2 diabetes mellitus with hyperglycemia: Secondary | ICD-10-CM | POA: Diagnosis present

## 2015-09-18 DIAGNOSIS — I5189 Other ill-defined heart diseases: Secondary | ICD-10-CM | POA: Diagnosis present

## 2015-09-18 DIAGNOSIS — F411 Generalized anxiety disorder: Secondary | ICD-10-CM | POA: Diagnosis present

## 2015-09-18 DIAGNOSIS — IMO0002 Reserved for concepts with insufficient information to code with codable children: Secondary | ICD-10-CM | POA: Diagnosis present

## 2015-09-18 DIAGNOSIS — E118 Type 2 diabetes mellitus with unspecified complications: Secondary | ICD-10-CM

## 2015-09-18 DIAGNOSIS — I519 Heart disease, unspecified: Secondary | ICD-10-CM | POA: Diagnosis present

## 2015-09-18 DIAGNOSIS — M4712 Other spondylosis with myelopathy, cervical region: Principal | ICD-10-CM

## 2015-09-18 DIAGNOSIS — J45909 Unspecified asthma, uncomplicated: Secondary | ICD-10-CM | POA: Diagnosis present

## 2015-09-18 DIAGNOSIS — N179 Acute kidney failure, unspecified: Secondary | ICD-10-CM | POA: Diagnosis present

## 2015-09-18 DIAGNOSIS — IMO0001 Reserved for inherently not codable concepts without codable children: Secondary | ICD-10-CM | POA: Insufficient documentation

## 2015-09-18 DIAGNOSIS — F319 Bipolar disorder, unspecified: Secondary | ICD-10-CM | POA: Diagnosis present

## 2015-09-18 DIAGNOSIS — F4310 Post-traumatic stress disorder, unspecified: Secondary | ICD-10-CM | POA: Diagnosis present

## 2015-09-18 DIAGNOSIS — Z794 Long term (current) use of insulin: Secondary | ICD-10-CM

## 2015-09-18 LAB — GLUCOSE, CAPILLARY
GLUCOSE-CAPILLARY: 220 mg/dL — AB (ref 65–99)
GLUCOSE-CAPILLARY: 174 mg/dL — AB (ref 65–99)
Glucose-Capillary: 180 mg/dL — ABNORMAL HIGH (ref 65–99)
Glucose-Capillary: 133 mg/dL — ABNORMAL HIGH (ref 65–99)
Glucose-Capillary: 110 mg/dL — ABNORMAL HIGH (ref 65–99)

## 2015-09-18 LAB — BASIC METABOLIC PANEL
ANION GAP: 11 (ref 5–15)
BUN: 21 mg/dL — AB (ref 6–20)
CALCIUM: 8.8 mg/dL — AB (ref 8.9–10.3)
CO2: 24 mmol/L (ref 22–32)
Chloride: 96 mmol/L — ABNORMAL LOW (ref 101–111)
Creatinine, Ser: 2.79 mg/dL — ABNORMAL HIGH (ref 0.44–1.00)
GFR calc Af Amer: 22 mL/min — ABNORMAL LOW (ref >60)
GFR calc non Af Amer: 19 mL/min — ABNORMAL LOW (ref >60)
GLUCOSE: 196 mg/dL — AB (ref 65–99)
Potassium: 4.8 mmol/L (ref 3.5–5.1)
Sodium: 131 mmol/L — ABNORMAL LOW (ref 135–145)

## 2015-09-18 MED ORDER — INSULIN ASPART 100 UNIT/ML ~~LOC~~ SOLN: 0.0000 [IU] | Freq: Every day | SUBCUTANEOUS | Status: DC

## 2015-09-18 MED ORDER — SODIUM CHLORIDE 0.9 % IV SOLN
INTRAVENOUS | Status: DC
  Administered 2015-09-18 – 2015-09-19 (×3): via INTRAVENOUS
  Administered 2015-09-21: 1000 mL via INTRAVENOUS

## 2015-09-18 MED ORDER — IPRATROPIUM-ALBUTEROL 0.5-2.5 (3) MG/3ML IN SOLN
3.0000 mL | RESPIRATORY_TRACT | Status: DC | PRN
  Administered 2015-09-22: 3 mL via RESPIRATORY_TRACT
  Filled 2015-09-18: qty 3

## 2015-09-18 MED ORDER — ATENOLOL 25 MG PO TABS: 12.5000 mg | ORAL_TABLET | Freq: Every day | ORAL | Status: DC

## 2015-09-18 MED ORDER — INSULIN ASPART 100 UNIT/ML ~~LOC~~ SOLN
0.0000 [IU] | Freq: Three times a day (TID) | SUBCUTANEOUS | Status: DC
  Administered 2015-09-18: 4 [IU] via SUBCUTANEOUS
  Administered 2015-09-18 – 2015-09-19 (×2): 3 [IU] via SUBCUTANEOUS

## 2015-09-18 MED ORDER — TAMSULOSIN HCL 0.4 MG PO CAPS
0.4000 mg | ORAL_CAPSULE | Freq: Every day | ORAL | Status: DC
  Administered 2015-09-18 – 2015-09-23 (×6): 0.4 mg via ORAL
  Filled 2015-09-18 (×6): qty 1

## 2015-09-18 MED ORDER — INSULIN ASPART PROT & ASPART (70-30 MIX) 100 UNIT/ML ~~LOC~~ SUSP
35.0000 [IU] | Freq: Two times a day (BID) | SUBCUTANEOUS | Status: DC
  Administered 2015-09-18 – 2015-09-20 (×3): 35 [IU] via SUBCUTANEOUS
  Filled 2015-09-18: qty 10

## 2015-09-18 NOTE — Progress Notes (Signed)
Patient refusing CPAP tonight. Concern with mask and patients neck brace. Daughter is in room with patient and states that they should be getting discharged tomorrow. RT will continue to monitor.

## 2015-09-18 NOTE — Evaluation (Addendum)
Physical Therapy Evaluation Patient Details Name: Diana Henderson MRN: [id number] DOB: August 04, 1970 Today's Date: 09/18/2015   History of Present Illness  Pt is a 45 y.o. female s/p PCDF. She underwent ACDF approx 9 months ago. PMH consists of DM, HTN, PTSD, asthma, bipolar d/o, anxiety and depression.  Clinical Impression  Pt admitted with above diagnosis. Pt currently with functional limitations due to the deficits listed below (see PT Problem List). On eval, pt required min assist for all functional mobility. +2 assist utilized for safety due to pt size, lethargy and h/o knee buckling. Pt on 2.5 L O2 throughout mobility with vitals stable (HR 73, O2 100%).  Pt will benefit from skilled PT to increase their independence and safety with mobility to allow discharge to the venue listed below.       Follow Up Recommendations Home health PT;Supervision/Assistance - 24 hour    Equipment Recommendations  None recommended by PT    Recommendations for Other Services       Precautions / Restrictions Precautions Precautions: Fall;Cervical Required Braces or Orthoses: Cervical Brace Cervical Brace: Soft collar;At all times      Mobility  Bed Mobility Overal bed mobility: Needs Assistance Bed Mobility: Supine to Sit     Supine to sit: HOB elevated;Min assist     General bed mobility comments: +rail, verbal cues for sequencing  Transfers Overall transfer level: Needs assistance Equipment used: Rolling walker (2 wheeled) Transfers: Sit to/from [employer] Transfers Sit to Stand: Min assist;+2 safety/equipment Stand pivot transfers: Min assist;+2 safety/equipment       General transfer comment: verbal cues for sequencing, increased time needed to stabilize initial balance in stance  Ambulation/Gait Ambulation/Gait assistance: Min assist;+2 safety/equipment Ambulation Distance (Feet): 10 Feet Assistive device: Rolling walker (2 wheeled) Gait Pattern/deviations:  Step-through pattern;Wide base of support;Decreased stride length Gait velocity: decreased   General Gait Details: verbal cues for safety and RW management  Stairs            Wheelchair Mobility    Modified Rankin (Stroke Patients Only)       Balance Overall balance assessment: Needs assistance Sitting-balance support: No upper extremity supported;Feet supported Sitting balance-Leahy Scale: Good     Standing balance support: Bilateral upper extremity supported;During functional activity Standing balance-Leahy Scale: Fair Standing balance comment: RW needed for ambulation                             Pertinent Vitals/Pain Pain Assessment: Faces Faces Pain Scale: Hurts a little bit Pain Location: sx site Pain Intervention(s): Monitored during session;Repositioned    Home Living Family/patient expects to be discharged to:: Private residence Living Arrangements: Children Available Help at Discharge: Family;Friend(s);Available 24 hours/day;Personal care attendant Type of Home: Apartment Home Access: Level entry     Home Layout: One level Home Equipment: Walker - standard;Cane - single point      Prior Function Level of Independence: Needs assistance   Gait / Transfers Assistance Needed: supervision with ambulation using cane. Daughter reports occassional LOB but no falls.  ADL's / Homemaking Assistance Needed: PCA or daughter assists with all ADLs.        Hand Dominance   Dominant Hand: Right    Extremity/Trunk Assessment               Lower Extremity Assessment: Generalized weakness      Cervical / Trunk Assessment: Kyphotic  Communication   Communication: No difficulties  Cognition Arousal/Alertness: Lethargic Behavior  During Therapy: Flat affect Overall Cognitive Status: Impaired/Different from baseline Area of Impairment: Orientation;Attention;Problem solving;Safety/judgement;Following commands Orientation Level: Disoriented  to;Time Current Attention Level: Sustained   Following Commands: Follows one step commands with increased time Safety/Judgement: Decreased awareness of safety   Problem Solving: Slow processing;Requires verbal cues      General Comments      Exercises        Assessment/Plan    PT Assessment Patient needs continued PT services  PT Diagnosis Difficulty walking;Generalized weakness;Acute pain   PT Problem List Decreased strength;Decreased activity tolerance;Decreased balance;Decreased mobility;Pain;Decreased knowledge of precautions;Decreased safety awareness  PT Treatment Interventions DME instruction;Gait training;Functional mobility training;Therapeutic activities;Patient/family education;Balance training;Cognitive remediation   PT Goals (Current goals can be found in the Care Plan section) Acute Rehab PT Goals Patient Stated Goal: not stated PT Goal Formulation: With patient/family Time For Goal Achievement: 10/02/15 Potential to Achieve Goals: Good    Frequency Min 5X/week   Barriers to discharge        Co-evaluation               End of Session Equipment Utilized During Treatment: Gait belt;Cervical collar Activity Tolerance: Patient limited by lethargy Patient left: in chair;with family/visitor present;with call bell/phone within reach Nurse Communication: Mobility status         Time: [id number] PT Time Calculation (min) (ACUTE ONLY): 25 min   Charges:   PT Evaluation $PT Eval Moderate Complexity: 1 Procedure     PT G Codes:        Lorriane Shire 09/18/2015, 9:14 AM

## 2015-09-18 NOTE — Progress Notes (Signed)
Pt too lethargic to administer medication at this time. Pt with no noted distress. O2 sat 100% on 2L of 02 via n/c. Daughter at bedside said that this is baseline for her mother. She stated that her mother usually gets up after 12pm daily unless she has to take her to school.  Pt ambulated with therapy x2 with rolling walker, gait belt, soft collar on and aligned from her bed to the recliner.  Pt sitting up in recliner at this time does not want to go back to bed. Denies pain or discomfort. Safety measures in place. Call bell within reach. Will continue to monitor.

## 2015-09-18 NOTE — Progress Notes (Signed)
No acute events Persistently hyperglycemic Sleeping because she just got pain medicine, but arouses easily and MAEW Stable Consulted hospitalist for diabetes management

## 2015-09-18 NOTE — Evaluation (Addendum)
Occupational Therapy Evaluation Patient Details Name: Diana Henderson MRN: [id number] DOB: August 20, 1970 Today's Date: 09/18/2015    History of Present Illness Pt is a 45 y.o. female s/p Cervical four - seven laminectomies with lateral mass fusion. She underwent ACDF approx 9 months ago. PMH consists of DM, HTN, PTSD, asthma, bipolar d/o, anxiety and depression, hyperlipidemia, OA, GERD, obesity.   Clinical Impression   Pt s/p above. Pt independent with ADLs, PTA. Feel pt will benefit from acute OT to increase independence prior to d/c. Recommending HHOT upon d/c.     Follow Up Recommendations  Home health OT;Supervision/Assistance - 24 hour    Equipment Recommendations  Other (comment) (AE)    Recommendations for Other Services       Precautions / Restrictions Precautions Precautions: Fall;Cervical Precaution Comments: educated on cervical precautions Required Braces or Orthoses: Cervical Brace Cervical Brace: Soft collar;At all times Restrictions     Mobility Bed Mobility Overal bed mobility: Needs Assistance Bed Mobility: Supine to Sit     Supine to sit: [facility] elevated;Min assist     General bed mobility comments: cues given.   Transfers Overall transfer level: Needs assistance Equipment used: Rolling walker (2 wheeled) Transfers: Sit to/from [facility] Sit to Stand: Min assist;+2 safety/equipment Stand pivot transfers: Min assist;+2 safety/equipment       General transfer comment: verbal cues for sequencing, increased time needed to stabilize initial balance in stance    Balance Used RW for ambulation. Min assist (+2 for safety) for stand pivot transfer.                         ADL Overall ADL's : Needs assistance/impaired Eating/Feeding: Minimal assistance;Sitting            Upper Body Dressing: Total assistance; sitting (including cervical collar)       Lower Body Dressing: Total assistance;+2 for safety/equipment;Sit  to/from stand   Toilet Transfer: Minimal assistance;+2 for safety/equipment;Stand-pivot;RW (bed <> [facility]SC)   Toileting- Clothing Manipulation and Hygiene: Sit to/from stand;+2 for safety/equipment;Total assistance       Functional mobility during ADLs: +2 for safety/equipment;Rolling walker (+2 Min guard for ambulation; +2 Min assist for stand pivot; +2 Min assist for sit to stand transfer) General ADL Comments: OT helped adjust cervical collar.      Vision     Perception     Praxis      Pertinent Vitals/Pain Pain Assessment: 0-10 Pain Score: 8  Pain Location: back and shoulders Pain Intervention(s): Monitored during session     Hand Dominance Right   Extremity/Trunk Assessment Upper Extremity Assessment Upper Extremity Assessment: RUE deficits/detail;LUE deficits/detail RUE Deficits / Details: limited AROM shoulder flexion-reported shoulder pain; pt able to bring cup with straw to her mouth LUE Deficits / Details: limited AROM shoulder flexion-reported shoulder pain   Lower Extremity Assessment Lower Extremity Assessment: Defer to PT evaluation     Communication Communication Communication: Expressive difficulties (difficult to understand-lethargic)   Cognition Arousal/Alertness: Lethargic Behavior During Therapy: Flat affect Overall Cognitive Status:  (unsure of baseline) Area of Impairment: Attention;Following commands;Problem solving; Orientation Orientation Level: Disoriented to;Time Current Attention Level: Sustained   Following Commands: Follows one step commands with increased time Safety/Judgement: Decreased awareness of safety   Problem Solving: Slow processing;Requires verbal cues     General Comments       Exercises       Shoulder Instructions      Home Living Family/patient expects to be discharged to:: Private  residence Living Arrangements: Children Available Help at Discharge: Family;Friend(s);Available 24 hours/day Type of Home:  Apartment Home Access: Stairs to enter Entrance Stairs-Number of Steps: 1 Entrance Stairs-Rails: None Home Layout: One level     Bathroom Shower/Tub: Tub/shower unit       Home Equipment: Walker - standard;Cane - single point;Bedside commode          Prior Functioning/Environment Level of Independence: Needs assistance  Gait / Transfers Assistance Needed: used cane most of the time. Daughter reports occassional LOB but no falls. ADL's / Homemaking Assistance Needed: assist with bathing and dressing, cooking and cleaning as well as shower transfer        OT Diagnosis: Generalized weakness;Acute pain   OT Problem List: Decreased strength;Decreased range of motion;Decreased activity tolerance;Impaired balance (sitting and/or standing);Pain;Impaired UE functional use;Decreased knowledge of use of DME or AE;Decreased knowledge of precautions;Decreased cognition   OT Treatment/Interventions: Self-care/ADL training;Therapeutic exercise;DME and/or AE instruction;Therapeutic activities;Patient/family education;Balance training;Cognitive remediation/compensation    OT Goals(Current goals can be found in the care plan section) Acute Rehab OT Goals Patient Stated Goal: not stated OT Goal Formulation: With patient Time For Goal Achievement: 09/25/15 Potential to Achieve Goals: Good ADL Goals Pt Will Perform Grooming: sitting;with min assist Pt Will Perform Upper Body Bathing: sitting;with min assist (with or without AE) Pt Will Perform Lower Body Bathing: with min assist;with adaptive equipment;sit to/from stand Pt Will Perform Upper Body Dressing: sitting;with min assist Pt Will Perform Lower Body Dressing: with min assist;with adaptive equipment;sit to/from stand Pt Will Transfer to Toilet: with min guard assist;ambulating;bedside commode Pt Will Perform Toileting - Clothing Manipulation and hygiene: with min guard assist;sit to/from stand (with or without AE) Additional ADL Goal #1:  Pt will perform UE exercises with supervision to increase bilateral UE strength.  OT Frequency: Min 2X/week   Barriers to D/C:            Co-evaluation PT/OT/SLP Co-Evaluation/Treatment: Yes Reason for Co-Treatment: For patient/therapist safety   OT goals addressed during session: ADL's and self-care;Other (comment) (mobility)      End of Session Equipment Utilized During Treatment: Rolling walker;Oxygen;Gait belt  Activity Tolerance: Patient limited by lethargy Patient left: in chair;with call bell/phone within reach;with family/visitor present   Time: [id number] OT Time Calculation (min): 22 min Charges:  OT General Charges $OT Visit: 1 Procedure OT Evaluation $OT Eval Moderate Complexity: 1 Procedure G-CodesBenito Mccreedy OTR/L [id number] 09/18/2015, 11:36 AM

## 2015-09-18 NOTE — Consult Note (Signed)
[facility] Consultation Note                                                                                    Diana Henderson, is a 45 y.o. female  MRN: [id number]   DOB - Mar 06, 1971  Admit Date - 09/16/2015  Outpatient Primary MD for the patient is Benito Mccreedy, MD  Requesting MD: Ditty/ Neurosurgery  Consulting M.D: Sherral Hammers / Internal Medicine  PMH: Past Medical History  Diagnosis Date  . Diabetes mellitus   . Hypertension   . Hyperlipidemia   . Depression   . PTSD (post-traumatic stress disorder)   . Pain in joint, ankle and foot 02/17/2013  . Anxiety   . Bipolar 1 disorder ([place])   . Sleep apnea     uses CPAP intermittently, pt. doesn't remember where she had the study  . Asthma     has been eval. by Dr. Gwenette Greet- last 06/2014  . Pneumonia     hosp. for a couple of day- not sure when it was   . GERD (gastroesophageal reflux disease)   . Headache     sometimes has migraines   . Arthritis     osteoarthritis   . OSA on CPAP   . Knee pain, chronic   . Shortness of breath dyspnea   . Complication of anesthesia     09/13/14: malignant HTN with inducction, case cx but no sourse identified; 11/05/14 remifentanyl infusion used to attenuate hypergynamic induction      PSH: Past Surgical History  Procedure Laterality Date  . Tubal ligation    . Midtarsal arthrodesis Right 07/17/12    Rt foot  . Remove implant deep Right 07/17/12    Rt foot  . Lapidus fusion Right 01/10/12    Rt foot  . Carpal tunnel release Left 2001    Left wrist  . Gastroc recession extremity Right 3//20/14    Right foot  . Hernia repair      1996  . Remove implant deep Right 07/22/2014    Rt foot @ [facility]  . Cotton osteotomy w/ bone graft Right 09/30/2014    Rt foot @ [facility]  . Anterior cervical decomp/discectomy fusion N/A 11/05/2014    Procedure: ANTERIOR CERVICAL DECOMPRESSION/DISCECTOMY FUSION 1 LEVEL;  Surgeon: Consuella Lose, MD;  Location: [facility];  Service: Neurosurgery;   Laterality: N/A;  Cervical five- cervical seven anterior cervical decompression with fusion interbody prosthesis plating and bonegraft  . Cervical laminectomy  03/31/20176    CERVICAL 4 SEVEN LAMINECTOMIES WITH LATERAL FUSION     Reason for consultation: Assistance with diabetes management and management of chronic medical problems  HPI: 45 year old female patient with history of morbid obesity, diabetes on insulin, bipolar disorder and PTSD with associated anxiety, dyslipidemia, osteoarthritis, malignant hypertension, rate 1 diastolic dysfunction with mild LVH, untreated sleep apnea secondary to noncompliance with CPAP, and GERD. Patient was admitted on 3/31 to undergo elective C4-7 laminectomy secondary to decompression of spinal cord. Of note patient was seen in the ER 2 days prior to surgical procedure for uncontrolled diabetes. At that time her CBG was 473 and she was treated with IV fluids and additional  insulin. Patient reports because of inability to obtain glucometer test strips she has been unable to check her sugars at home. Patient underwent her surgical procedure without difficulty but in the postoperative period had significant hyperglycemia and was managed initially on an insulin infusion but quickly transitioned over to her usual home regimen. We have been asked to assist in management of patient's medical problems primarily her diabetes.   Review of Systems   **Unable to obtain complete history from patient secondary to feeling poorly in the immediate postoperative period, lethargy from lack of sleep and lethargy from recent pain medications.  Daughter at bedside and assisted with review of systems as best she could and daughter are unaware of any significant issues related to her diabetes other than the fact she has been unable to obtain her Accu-Chek test strips because they are not covered by Medicaid/Medicare and they're to costly for patient for out of pocket; daughter reports  patient did have sensation of feeling bloated yesterday afternoon and generalized abdominal pain  Social History Social History  Substance Use Topics  . Smoking status: Former Smoker -- 2.00 packs/day for 6 years    Types: Cigarettes    Quit date: 06/18/1978  . Smokeless tobacco: Never Used  . Alcohol Use: 0.0 oz/week    0 Standard drinks or equivalent per week     Comment: weekend- use of beer     Resides at: Private residence  Lives with: Daughter     Family History Family History  Problem Relation Age of Onset  . Heart attack Father   . Kidney disease Mother      Prior to Admission medications   Medication Sig Start Date End Date Taking? Authorizing Provider  albuterol (PROVENTIL HFA;VENTOLIN HFA) 108 (90 BASE) MCG/ACT inhaler Inhale 1-2 puffs into the lungs every 6 (six) hours as needed for wheezing or shortness of breath. 04/07/15  Yes Chesley Noon Nadeau, PA-C  amLODipine (NORVASC) 10 MG tablet Take 1 tablet (10 mg total) by mouth daily. 09/10/14  Yes Eugenie Filler, MD  atenolol (TENORMIN) 50 MG tablet Take 1 tablet (50 mg total) by mouth daily. 09/10/14  Yes Eugenie Filler, MD  beclomethasone (QVAR) 80 MCG/ACT inhaler Inhale 1 puff into the lungs 2 (two) times daily.   Yes Historical Provider, MD  budesonide-formoterol (SYMBICORT) 160-4.5 MCG/ACT inhaler Inhale 2 puffs into the lungs 2 (two) times daily.   Yes Historical Provider, MD  DULoxetine (CYMBALTA) 60 MG capsule Take 60 mg by mouth at bedtime.    Yes Historical Provider, MD  FARXIGA 10 MG TABS tablet Take 10 mg by mouth daily. 08/22/15  Yes Historical Provider, MD  fluticasone (FLONASE) 50 MCG/ACT nasal spray Place 1 spray into both nostrils 2 (two) times daily. 04/07/15  Yes Nona Dell, PA-C  gabapentin (NEURONTIN) 600 MG tablet Take 600 mg by mouth 3 (three) times daily.   Yes Historical Provider, MD  hydrOXYzine (ATARAX/VISTARIL) 50 MG tablet Take 50 mg by mouth 3 (three) times daily as  needed for anxiety.  10/22/14  Yes Historical Provider, MD  insulin NPH-regular Human (NOVOLIN 70/30) (70-30) 100 UNIT/ML injection Inject 30 Units into the skin 2 (two) times daily with a meal.   Yes Historical Provider, MD  INVOKANA 300 MG TABS tablet Take 300 mg by mouth daily. 09/18/14  Yes Historical Provider, MD  JANUMET XR 50-1000 MG TB24 Take 1 tablet by mouth daily. 09/18/14  Yes Historical Provider, MD  lactulose (CHRONULAC) 10 GM/15ML solution Take  45 mLs (30 g total) by mouth 3 (three) times daily. 09/10/14  Yes Eugenie Filler, MD  mometasone (NASONEX) 50 MCG/ACT nasal spray Place 2 sprays into the nose at bedtime. 04/07/15  Yes Rigoberto Noel, MD  Multiple Vitamin (MULTIVITAMIN) tablet Take 1 tablet by mouth daily. Reported on 09/12/2015   Yes Historical Provider, MD  nabumetone (RELAFEN) 500 MG tablet Take 1 tablet (500 mg total) by mouth 2 (two) times daily. 07/25/15  Yes Myeong O Sheard, DPM  olmesartan (BENICAR) 20 MG tablet Take 20 mg by mouth daily.   Yes Historical Provider, MD  omeprazole (PRILOSEC) 20 MG capsule Take 20 mg by mouth daily.    Yes Historical Provider, MD  ondansetron (ZOFRAN ODT) 8 MG disintegrating tablet Take 1 tablet (8 mg total) by mouth every 8 (eight) hours as needed for nausea or vomiting. 01/28/14  Yes Sherwood Gambler, MD  oxyCODONE-acetaminophen (PERCOCET) 10-325 MG tablet Take 1 tablet by mouth every 8 (eight) hours as needed for pain. 08/29/15  Yes Myeong O Sheard, DPM  Psyllium (METAMUCIL PO) Take 1 scoop by mouth daily as needed (for constipation).    Yes Historical Provider, MD  risperiDONE (RISPERDAL) 0.5 MG tablet Take 0.5-1 mg by mouth 2 (two) times daily. Take 0.5 mg in the morning and take 1 mg in the evening 10/22/14  Yes Historical Provider, MD  tiZANidine (ZANAFLEX) 4 MG tablet Take 1 tablet (4 mg total) by mouth every 8 (eight) hours as needed for muscle spasms. 09/10/14  Yes Eugenie Filler, MD  trimethoprim-polymyxin b (POLYTRIM) ophthalmic solution  Place 1 drop into the left eye every 4 (four) hours. 02/19/14  Yes Donne Hazel, MD    Allergies  Allergen Reactions  . Celery Oil Hives  . Pork-Derived Products Nausea And Vomiting and Other (See Comments)    headache  . Latex Itching    Powdered latex gloves when patient wears.      Physical Exam  Vitals  Blood pressure 105/55, pulse 83, temperature 100.5 F (38.1 C), temperature source Axillary, resp. rate 20, height 5\' 6"  (1.676 m), weight 138.432 kg (305 lb 3 oz), SpO2 98 %.   General:  In no acute distress, appears Acutely ill and lethargic in setting of sedating and other pain medications  Psych: Sleepy but easily awakens to voice but minimally conversant. Follows commands without difficulty  Neuro:   No focal neurological deficits, CN II through XII intact, Strength testing deferred due to patient ongoing neck pain and sedation, Sensation intact all 4 extremities.  ENT:  Ears and Eyes appear Normal, Conjunctivae clear, PER. dry oral mucosa without erythema or exudates.  Neck:  Exam limited by cervical collar in place  Respiratory:  Symmetrical chest wall movement, diminished air movement in bases bilaterally, CTAB without wheeze, crackles or rhonchi. He leaves her oxygen  Cardiac:  RRR, No Murmurs, no LE edema noted, no JVD, No carotid bruits, peripheral pulses palpable at 2+  Abdomen:  Positive bowel sounds, Soft, Non tender, Non distended,  No masses appreciated, no obvious hepatosplenomegaly  Skin:  No Cyanosis, Normal Skin Turgor, No Skin Rash or Bruise.  Extremities: Symmetrical without obvious trauma or injury,  no effusions.  Data Review  CBC  Recent Labs Lab 09/13/15 1347 09/14/15 1721  WBC 3.6* 5.7  HGB 13.0 13.6  HCT 40.9 42.4  PLT 161 177  MCV 83.8 84.5  MCH 26.6 27.1  MCHC 31.8 32.1  RDW 13.1 12.9    Chemistries  Recent Labs Lab 09/13/15 1347 09/14/15 1721 09/17/15 1914 09/18/15 1145  NA 134* 132* 131* 131*  K 4.8 4.2 4.8 4.8   CL 101 99* 96* 96*  CO2 25 21* 23 24  GLUCOSE 360* 333* 262* 196*  BUN 9 11 16  21*  CREATININE 1.07* 1.03* 1.76* 2.79*  CALCIUM 8.9 8.9 8.6* 8.8*    estimated creatinine clearance is 36.2 mL/min (by C-G formula based on Cr of 2.79).  No results for input(s): TSH, T4TOTAL, T3FREE, THYROIDAB in the last 72 hours.  Invalid input(s): FREET3  Coagulation profile No results for input(s): INR, PROTIME in the last 168 hours.  No results for input(s): DDIMER in the last 72 hours.  Cardiac Enzymes No results for input(s): CKMB, TROPONINI, MYOGLOBIN in the last 168 hours.  Invalid input(s): CK  Invalid input(s): POCBNP  Urinalysis    Component Value Date/Time   COLORURINE YELLOW 09/14/2015 [place] 09/14/2015 1738   LABSPEC 1.017 09/14/2015 1738   PHURINE 5.5 09/14/2015 1738   GLUCOSEU >1000* 09/14/2015 1738   HGBUR NEGATIVE 09/14/2015 1738   BILIRUBINUR NEGATIVE 09/14/2015 1738   KETONESUR NEGATIVE 09/14/2015 1738   PROTEINUR NEGATIVE 09/14/2015 1738   UROBILINOGEN 0.2 01/28/2014 0655   NITRITE NEGATIVE 09/14/2015 1738   LEUKOCYTESUR NEGATIVE 09/14/2015 1738    Imaging results:   Dg Cervical Spine 1 View  09/16/2015  CLINICAL DATA:  C4-7 posterior fusion. EXAM: CERVICAL SPINE 1 VIEW COMPARISON:  MRI from 08/17/2015. FINDINGS: A single spot fluoro images obtained at 1135 hours. Anterior cervical plate is visualized with bilateral lateral mass screws and fusion rods. IMPRESSION: Intraoperative localization. Electronically Signed   By: Misty Stanley M.D.   On: 09/16/2015 12:05   Dg Foot Complete Left  08/29/2015  Radiographic examination reveal: Right foot has positive of bone callus around fusion site of 1st MCJ, irregular joint space at Naviculocuneiform joint and ankle joint right foot with elevated first metatarsal bone. Old fusion site at first [place] left appears to be fused with bone callus. Left foot show in lateral view elevated first ray, normal and  congruous joint surfaces and spaces at TNJ, and ankle joint. Impressions: Right foot and ankle pain due to osteoarthropathy of midfoot and ankle joint. Left foot pain from faulty biomechanics.  Dg Foot Complete Right  08/29/2015  Radiographic examination reveal: Right foot has positive of bone callus around fusion site of 1st MCJ, irregular joint space at Naviculocuneiform joint and ankle joint right foot with elevated first metatarsal bone. Old fusion site at first [place] left appears to be fused with bone callus. Left foot show in lateral view elevated first ray, normal and congruous joint surfaces and spaces at TNJ, and ankle joint. Impressions: Right foot and ankle pain due to osteoarthropathy of midfoot and ankle joint. Left foot pain from faulty biomechanics.  Dg C-arm 1-60 Min  09/16/2015  CLINICAL DATA:  Intraoperative localization. EXAM: DG C-ARM 61-120 MIN COMPARISON:  None. FINDINGS: Frontal view the cervical spine obtained during posterior cervical fusion shows bilateral lateral mass screws with fusion rods. There is an anterior cervical plate in situ. IMPRESSION: Intraoperative localization. Electronically Signed   By: Misty Stanley M.D.   On: 09/16/2015 12:11      Assessment & Plan  Principal Problem:   Diabetes mellitus,Type 2 uncontrolled with Complications -Diabetes clearly poorly controlled for at least 3 months noting hemoglobin A1c 11.1 -Patient with mild acute kidney injury and volume depleted so we'll hold Invokana this juncture as well  as metformin -Continue Tradjenta -Increase 70/30 insulin from 30 units twice a day to 35 units twice a day -Continue resistant sliding scale insulin -Diabetes educator consultation -Patient likely needs different glucometer set up noting with her current Accu-Chek device she is unable to afford test strips -Patient has associated peripheral neuropathy so continue Neurontin  Active Problems:   HTN, malignant -Current blood pressure  suboptimal in setting of volume depletion  -Discontinue Norvasc and Benicar for now and once patient euvolemic can resume in a stepwise fashion  -Decrease Atenolol (Tenormin) from 50 mg daily to 12.5 mg daily    AKI  -Baseline renal function BUN 6 and creatinine 0.89-current BUN 16 and creatinine 1.76 -Secondary to volume depletion in setting of poorly controlled CBGs prior to admission and concomitant use of Invokana -Normal saline at 100 mL per hour -Hold offending medications -Repeat electrolyte panel today and in a.m. **Repeat electrolyte panel shows worsened renal function BUN 21 and creatinine up to 2.79 from 1.76 so will continue treatments as above    Hyperlipidemia -Not on statin prior to admission -Lipid panel pending, will most likely need to start statin    OSA  -Previously prescribed CPAP but does not utilize -Have ordered CPAP here but uncertain if patient can wear in setting of need for soft cervical collar -Patient needs outpatient sleep disorders physician; already established with local neurologist but unclear if they specialize in sleep disorders-patient daughter to clarify at next visit    Morbid obesity  -Suspect weight reduction strategies impacted by lack of sleep in setting of noncompliance with CPAP -Nutrition consultation    Asthma -Currently not wheezing -Continue preadmission MDIs and have prn DuoNeb available    Left ventricular diastolic dysfunction, NYHA class 1 -No evidence of decompensated heart failure adding patient actually appears volume depleted    PTSD-bipolar disorder-anxiety disorder -Continue Cymbalta, hydroxyzine, Relafen, Prilosec, and Risperdal    Cervical spondylosis with myelopathy -Post operative cervical laminectomy with management per primary team    Constipation/chronic -Continue preadmission medication    DVT Prophylaxis: SCDs  Family Communication:   Daughter at bedside  Code Status:  Full code  Condition:   Stable  Discharge disposition: At discretion of primary service  Time spent in minutes : 60      Zuma Hust J ANP on 09/18/2015 at 8:34 PM  You may contact me by going to [email or web address] - password [email or web address]  I am available from 7a-7p but please confirm I am on the schedule by going to Amion as above.   After 7p please contact night coverage person covering me after hours  [place] during the described time interval was provided by myself and ANP Erin Hearing .  I have reviewed this patient's available data, including medical history, events of note, physical examination, and all test results as part of my evaluation. I have personally reviewed and interpreted all radiology studies.   Dia Crawford, MD [phone] Pager

## 2015-09-18 NOTE — Progress Notes (Signed)
Pt did not want to be straight cath had not voided since this am.  Pt put on bedside commode x2 but still unable to void. Pt lower abdomen distended. Pt denied discomfort to touch. Md paged, instructed to straight cath and administer Flomax if unable to void next attempt to keep urinary catheter in. Pt lying in bed on her side at this time asleep. No noted distress. Will continue to monitor.

## 2015-09-19 ENCOUNTER — Encounter (HOSPITAL_COMMUNITY): Payer: Self-pay | Admitting: Neurosurgery

## 2015-09-19 DIAGNOSIS — E1065 Type 1 diabetes mellitus with hyperglycemia: Secondary | ICD-10-CM

## 2015-09-19 DIAGNOSIS — J45909 Unspecified asthma, uncomplicated: Secondary | ICD-10-CM

## 2015-09-19 DIAGNOSIS — E108 Type 1 diabetes mellitus with unspecified complications: Secondary | ICD-10-CM

## 2015-09-19 LAB — GLUCOSE, CAPILLARY
GLUCOSE-CAPILLARY: 90 mg/dL (ref 65–99)
GLUCOSE-CAPILLARY: 102 mg/dL — AB (ref 65–99)
Glucose-Capillary: 107 mg/dL — ABNORMAL HIGH (ref 65–99)
Glucose-Capillary: 112 mg/dL — ABNORMAL HIGH (ref 65–99)
Glucose-Capillary: 138 mg/dL — ABNORMAL HIGH (ref 65–99)

## 2015-09-19 LAB — URINALYSIS W MICROSCOPIC (NOT AT ARMC)
BILIRUBIN URINE: NEGATIVE
GLUCOSE, UA: NEGATIVE mg/dL
KETONES UR: NEGATIVE mg/dL
LEUKOCYTES UA: NEGATIVE
NITRITE: NEGATIVE
PH: 5 (ref 5.0–8.0)
Protein, ur: 30 mg/dL — AB
SPECIFIC GRAVITY, URINE: 1.015 (ref 1.005–1.030)
WBC UA: NONE SEEN WBC/hpf (ref 0–5)

## 2015-09-19 LAB — BASIC METABOLIC PANEL
Anion gap: 11 (ref 5–15)
BUN: 29 mg/dL — AB (ref 6–20)
CALCIUM: 8.1 mg/dL — AB (ref 8.9–10.3)
CO2: 25 mmol/L (ref 22–32)
Chloride: 97 mmol/L — ABNORMAL LOW (ref 101–111)
Creatinine, Ser: 3.18 mg/dL — ABNORMAL HIGH (ref 0.44–1.00)
GFR calc Af Amer: 19 mL/min — ABNORMAL LOW (ref >60)
GFR calc non Af Amer: 16 mL/min — ABNORMAL LOW (ref >60)
GLUCOSE: 147 mg/dL — AB (ref 65–99)
Potassium: 4.4 mmol/L (ref 3.5–5.1)
Sodium: 133 mmol/L — ABNORMAL LOW (ref 135–145)

## 2015-09-19 LAB — LIPID PANEL
CHOL/HDL RATIO: 3.1 ratio
CHOLESTEROL: 156 mg/dL (ref 0–200)
HDL: 50 mg/dL (ref >40)
LDL CALC: 85 mg/dL (ref 0–99)
TRIGLYCERIDES: 105 mg/dL (ref <150)
VLDL: 21 mg/dL (ref 0–40)

## 2015-09-19 LAB — SODIUM, URINE, RANDOM: Sodium, Ur: 35 mmol/L

## 2015-09-19 LAB — CBC
HEMATOCRIT: 34.4 % — AB (ref 36.0–46.0)
Hemoglobin: 10.6 g/dL — ABNORMAL LOW (ref 12.0–15.0)
MCH: 26.4 pg (ref 26.0–34.0)
MCHC: 30.8 g/dL (ref 30.0–36.0)
MCV: 85.6 fL (ref 78.0–100.0)
Platelets: 143 10*3/uL — ABNORMAL LOW (ref 150–400)
RBC: 4.02 MIL/uL (ref 3.87–5.11)
RDW: 13.3 % (ref 11.5–15.5)
WBC: 10.5 10*3/uL (ref 4.0–10.5)

## 2015-09-19 LAB — CREATININE, URINE, RANDOM: CREATININE, URINE: 147.03 mg/dL

## 2015-09-19 MED ORDER — DIAZEPAM 5 MG PO TABS
5.0000 mg | ORAL_TABLET | Freq: Three times a day (TID) | ORAL | Status: DC
  Administered 2015-09-19 – 2015-09-21 (×6): 5 mg via ORAL
  Filled 2015-09-19 (×6): qty 1

## 2015-09-19 MED ORDER — INSULIN ASPART 100 UNIT/ML ~~LOC~~ SOLN
0.0000 [IU] | Freq: Three times a day (TID) | SUBCUTANEOUS | Status: DC
  Administered 2015-09-20 (×3): 2 [IU] via SUBCUTANEOUS
  Administered 2015-09-21: 3 [IU] via SUBCUTANEOUS
  Administered 2015-09-21: 2 [IU] via SUBCUTANEOUS
  Administered 2015-09-21 – 2015-09-22 (×4): 3 [IU] via SUBCUTANEOUS
  Administered 2015-09-23: 2 [IU] via SUBCUTANEOUS

## 2015-09-19 MED ORDER — LIVING WELL WITH DIABETES BOOK
Freq: Once | Status: AC
Start: 2015-09-19 — End: 2015-09-19
  Administered 2015-09-19: 21:00:00
  Filled 2015-09-19: qty 1

## 2015-09-19 MED ORDER — GABAPENTIN 600 MG PO TABS
600.0000 mg | ORAL_TABLET | Freq: Every day | ORAL | Status: DC
  Administered 2015-09-20 – 2015-09-23 (×4): 600 mg via ORAL
  Filled 2015-09-19 (×4): qty 1

## 2015-09-19 MED ORDER — INSULIN ASPART 100 UNIT/ML ~~LOC~~ SOLN
0.0000 [IU] | Freq: Every day | SUBCUTANEOUS | Status: DC
Start: 2015-09-19 — End: 2015-09-23

## 2015-09-19 NOTE — Progress Notes (Signed)
[facility] Consultation Progress Note  Patient: Diana Henderson [id number]   PCP: Benito Mccreedy, MD DOB: 03/29/1971   DOA: 09/16/2015   DOS: 09/19/2015   Date of Service: the patient was seen and examined on 09/19/2015 Primary service: Consuella Lose, MD  Subjective: The patient complained of significant neck pain. Patient also had increase in urinary retention. Does not have any abdominal complaints or chest pain also denies any focal deficit Nutrition: Minimal oral intake Activity: Bed to chair Last BM: 09/19/2015  Assessment and Plan: 1. Diabetes mellitus, insulin dependent (IDDM), uncontrolled (HCC) CBG last 24 hours well-controlled between 107-180. Continue NPH, 35 twice a day. We will lower the sliding scale from resistant to moderate. Continue Linagliptin Continue to hold metformin in the setting of worsening renal function  2. Acute kidney injury. Acute urinary retention Renal function progressively worsening this morning. Likely from poor oral intake. Oral intake likely from acute encephalopathy due to delirium due to severe pain as well as pain medications. We will monitor serial bladder scan. If the patient has more than 300 mL of urinary retention again patient will need a Foley catheter placement. Continue IV hydration at present. Check urine FENa. Avoid nephrotoxic medications.  3. Obstructive sleep apnea. Not wearing CPAP at her.  4. Diastolic dysfunction. Monitor for volume overload in the setting of worsening renal function as well as IV fluids.  5. Mood disorder. Continuing home medications.  6. Essential hypertension. Currently holding all blood pressure medication in the setting of worsening renal function and normal blood pressure.  DVT Prophylaxis: mechanical compression device. Nutrition: Carb modified diet Thank you very much for involving [place] in care of your patient.   Disposition: We will continue to follow the patient.    Barriers to safe discharge: Stabilization of the renal function  Recommendation on discharge: Follow-up with PCP in one week to adjust insulin levels  Advance goals of care discussion: Full code from prior  Family Communication: no family was present at bedside, at the time of interview.   Brief Summary of Hospitalization:  HPI: As per the note dictated on day of consult, "45 year old female patient with history of morbid obesity, diabetes on insulin, bipolar disorder and PTSD with associated anxiety, dyslipidemia, osteoarthritis, malignant hypertension, rate 1 diastolic dysfunction with mild LVH, untreated sleep apnea secondary to noncompliance with CPAP, and GERD. Patient was admitted on 3/31 to undergo elective C4-7 laminectomy secondary to decompression of spinal cord. Of note patient was seen in the ER 2 days prior to surgical procedure for uncontrolled diabetes. At that time her CBG was 473 and she was treated with IV fluids and additional insulin. Patient reports because of inability to obtain glucometer test strips she has been unable to check her sugars at home. Patient underwent her surgical procedure without difficulty but in the postoperative period had significant hyperglycemia and was managed initially on an insulin infusion but quickly transitioned over to her usual home regimen. We have been asked to assist in management of patient's medical problems primarily her diabetes." Procedures: Cervical laminectomy Antibiotics: Anti-infectives    Start     Dose/Rate Route Frequency Ordered Stop   09/17/15 1500  ceFAZolin (ANCEF) IVPB 2g/100 mL premix     2 g 200 mL/hr over 30 Minutes Intravenous Every 8 hours 09/17/15 1407 09/17/15 2340   09/16/15 1003  bacitracin 50,000 Units in sodium chloride irrigation 0.9 % 500 mL irrigation  Status:  Discontinued       As needed 09/16/15 1003 09/16/15 1215  09/16/15 0600  ceFAZolin (ANCEF) 3 g in dextrose 5 % 50 mL IVPB     3 g 130 mL/hr over  30 Minutes Intravenous On call to O.R. 09/15/15 1332 09/16/15 0850        Intake/Output Summary (Last 24 hours) at 09/19/15 1436 Last data filed at 09/19/15 0510  Gross per 24 hour  Intake      0 ml  Output    700 ml  Net   -700 ml   Filed Weights   09/16/15 0627  Weight: 138.432 kg (305 lb 3 oz)    Objective: Physical Exam: Filed Vitals:   09/19/15 0502 09/19/15 0949 09/19/15 1024 09/19/15 1426  BP: 140/89  108/57 122/65  Pulse: 90 82 78 87  Temp: 98.3 F (36.8 C)  98.1 F (36.7 C) 98.2 F (36.8 C)  TempSrc: Oral  Oral Oral  Resp: 20 16 17 18   Height:      Weight:      SpO2: 97% 100% 100% 100%     General: Appear in moderate distress, no Rash; Oral Mucosa moist. Cardiovascular: S1 and S2 Present, no Murmur Respiratory: Bilateral Air entry present and Clear to Auscultation, no Crackles, no wheezes Abdomen: Bowel Sound present, Soft and no tenderness Extremities: trace Pedal edema, no calf tenderness Neurology: Grossly no focal neuro deficit.  Data Reviewed: CBC:  Recent Labs Lab 09/13/15 1347 09/14/15 1721 09/19/15 0628  WBC 3.6* 5.7 10.5  HGB 13.0 13.6 10.6*  HCT 40.9 42.4 34.4*  MCV 83.8 84.5 85.6  PLT 161 177 [id number]*   Basic Metabolic Panel:  Recent Labs Lab 09/13/15 1347 09/14/15 1721 09/17/15 1914 09/18/15 1145 09/19/15 0628  NA 134* 132* 131* 131* 133*  K 4.8 4.2 4.8 4.8 4.4  CL 101 99* 96* 96* 97*  CO2 25 21* 23 24 25   GLUCOSE 360* 333* 262* 196* 147*  BUN 9 11 16  21* 29*  CREATININE 1.07* 1.03* 1.76* 2.79* 3.18*  CALCIUM 8.9 8.9 8.6* 8.8* 8.1*   Liver Function Tests: No results for input(s): AST, ALT, ALKPHOS, BILITOT, PROT, ALBUMIN in the last 168 hours. No results for input(s): LIPASE, AMYLASE in the last 168 hours. No results for input(s): AMMONIA in the last 168 hours.  Cardiac Enzymes: No results for input(s): CKTOTAL, CKMB, CKMBINDEX, TROPONINI in the last 168 hours. BNP (last 3 results) No results for input(s): BNP in the  last 8760 hours.  CBG:  Recent Labs Lab 09/18/15 1641 09/18/15 1951 09/18/15 2358 09/19/15 0406 09/19/15 1147  GLUCAP 133* 110* 107* 138* 112*    Recent Results (from the past 240 hour(s))  Surgical pcr screen     Status: None   Collection Time: 09/13/15  1:47 PM  Result Value Ref Range Status   MRSA, PCR NEGATIVE NEGATIVE Final   Staphylococcus aureus NEGATIVE NEGATIVE Final    Comment:        The Xpert SA Assay (FDA approved for NASAL specimens in patients over 56 years of age), is one component of a comprehensive surveillance program.  Test performance has been validated by [facility] for patients greater than or equal to 18 year old. It is not intended to diagnose infection nor to guide or monitor treatment.      Studies: No results found.   Scheduled Meds: . budesonide (PULMICORT) nebulizer solution  0.25 mg Nebulization BID  . diazepam  5 mg Oral 3 times per day  . docusate sodium  100 mg Oral BID  . DULoxetine  60 mg Oral QHS  . fluticasone  1 spray Each Nare BID  . gabapentin  600 mg Oral TID  . insulin aspart  0-20 Units Subcutaneous TID WC  . insulin aspart  0-5 Units Subcutaneous QHS  . insulin aspart protamine- aspart  35 Units Subcutaneous BID WC  . lactulose  30 g Oral TID  . linagliptin  5 mg Oral Q breakfast  . living well with diabetes book   Does not apply Once  . mometasone-formoterol  2 puff Inhalation BID  . multivitamin with minerals  1 tablet Oral Daily  . nabumetone  500 mg Oral BID  . pantoprazole  40 mg Oral Daily  . psyllium  1 packet Oral Daily  . risperiDONE  0.5 mg Oral Daily  . risperiDONE  1 mg Oral QHS  . senna  1 tablet Oral BID  . tamsulosin  0.4 mg Oral Daily  . trimethoprim-polymyxin b  1 drop Left Eye 6 times per day   Continuous Infusions: . sodium chloride 100 mL/hr at 09/18/15 2059   PRN Meds: acetaminophen **OR** acetaminophen, albuterol, dextrose, HYDROmorphone (DILAUDID) injection, hydrOXYzine,  ipratropium-albuterol, menthol-cetylpyridinium **OR** phenol, ondansetron (ZOFRAN) IV, ondansetron, oxyCODONE-acetaminophen, polyethylene glycol  Time spent: 30 minutes  Author: Berle Mull, MD [facility] Pager: [phone] 09/19/2015 2:36 PM  If 7PM-7AM, please contact night-coverage at [email or web address], password [facility]

## 2015-09-19 NOTE — Progress Notes (Signed)
Pt wears CPAP ar home "sometime". Rt assessed and because of the neck brace, pt would not be comfortable with mask on. Discussed with patient and family/friend at bedside. Pt stable on 2 L [place]. Resting well. Will cont to monitor.

## 2015-09-19 NOTE — Progress Notes (Signed)
Pt attempted to void on a BSC but could not, pt's bladder scanned to a volume of greater than 571, straight cath done, 743ml cloudy urine drained out, pt made comfortable in bed, will continue to monitor.Obasogie-Asidi, Harlow Carrizales Efe

## 2015-09-19 NOTE — Care Management Note (Signed)
Case Management Note  Patient Details  Name: Diana Henderson MRN: [id number] Date of Birth: 1970-08-01  Subjective/Objective:                    Action/Plan: Rec is for HHPT/OT when patient medically ready. CM continuing to follow for discharge needs.   Expected Discharge Date:                  Expected Discharge Plan:     In-House Referral:     Discharge planning Services     Post Acute Care Choice:    Choice offered to:     DME Arranged:    DME Agency:     HH Arranged:    HH Agency:     Status of Service:  In process, will continue to follow  Medicare Important Message Given:    Date Medicare IM Given:    Medicare IM give by:    Date Additional Medicare IM Given:    Additional Medicare Important Message give by:     If discussed at [place] of Stay Meetings, dates discussed:    Additional Comments:  Pollie Friar, RN 09/19/2015, 12:13 PM

## 2015-09-19 NOTE — Progress Notes (Signed)
Nutrition Consult/Brief Note  RD consulted for nutrition education regarding diabetes.   Lab Results  Component Value Date   HGBA1C 11.1* 09/13/2015    Patient sleeping upon RD visit.  Spoke with daughter.  RD provided "Carbohydrate Counting for People with Diabetes" handout from the Academy of Nutrition and Dietetics. Discussed different food groups and their effects on blood sugar, emphasizing carbohydrate-containing foods. Provided list of carbohydrates and recommended serving sizes of common foods.  Discussed importance of controlled and consistent carbohydrate intake throughout the day. Provided examples of ways to balance meals/snacks and encouraged intake of high-fiber, whole grain complex carbohydrates.   Body mass index is 49.28 kg/(m^2). Pt meets criteria for Obesity Class III based on current BMI.  Current diet order is Carbohydrate Modified.  Labs and medications reviewed. No further nutrition interventions warranted at this time. If additional nutrition issues arise, please re-consult RD.  Arthur Holms, RD, LDN Pager #: [phone] After-Hours Pager #: [phone]

## 2015-09-19 NOTE — Progress Notes (Signed)
Physical Therapy Treatment Patient Details Name: Diana Henderson MRN: [id number] DOB: 05-26-1971 Today's Date: 09/19/2015    History of Present Illness Pt is a 45 y.o. female s/p Cervical four - seven laminectomies with lateral mass fusion. She underwent ACDF approx 9 months ago. PMH consists of DM, HTN, PTSD, asthma, bipolar d/o, anxiety and depression, hyperlipidemia, OA, GERD, obesity.    PT Comments    Pt progressing slowly with mobility. If slow progress continues, will need to consider ST SNF at d/c.  Follow Up Recommendations  Home health PT;Supervision/Assistance - 24 hour     Equipment Recommendations  None recommended by PT    Recommendations for Other Services       Precautions / Restrictions Precautions Precautions: Fall;Cervical Precaution Comments: educated on cervical precautions Required Braces or Orthoses: Cervical Brace Cervical Brace: Soft collar;At all times Restrictions Weight Bearing Restrictions: No    Mobility  Bed Mobility Overal bed mobility: Needs Assistance       Supine to sit: HOB elevated;Min assist     General bed mobility comments: +rail, verbal cues for sequencing  Transfers Overall transfer level: Needs assistance Equipment used: Rolling walker (2 wheeled)   Sit to Stand: Min assist;+2 safety/equipment Stand pivot transfers: Min assist;+2 safety/equipment       General transfer comment: Stand pivot transfers with RW bed > BSC> recliner.  Verbal cues for sequencing and safety. Increased time to stabilize initial balance.   Ambulation/Gait Ambulation/Gait assistance: Min assist;+2 safety/equipment Ambulation Distance (Feet): 3 Feet Assistive device: Rolling walker (2 wheeled) Gait Pattern/deviations: Step-through pattern;Wide base of support;Decreased stride length Gait velocity: decreased Gait velocity interpretation: Below normal speed for age/gender General Gait Details: Knee buckling x 1 during SPT requiring min assist  to maintain stance. Verbal cues for sequencing and RW management.   Stairs            Wheelchair Mobility    Modified Rankin (Stroke Patients Only)       Balance   Sitting-balance support: Feet supported;No upper extremity supported Sitting balance-Leahy Scale: Good     Standing balance support: During functional activity;Bilateral upper extremity supported Standing balance-Leahy Scale: Fair Standing balance comment: RW needed for ambulation.                    Cognition Arousal/Alertness: Awake/alert Behavior During Therapy: WFL for tasks assessed/performed Overall Cognitive Status: Within Functional Limits for tasks assessed                      Exercises General Exercises - Lower Extremity Ankle Circles/Pumps: AROM;Both;10 reps    General Comments        Pertinent Vitals/Pain Pain Assessment: 0-10 Pain Score: 8  Pain Location: upper back Pain Descriptors / Indicators: Sore;Cramping Pain Intervention(s): RN gave pain meds during session;Monitored during session;Repositioned    Home Living                      Prior Function            PT Goals (current goals can now be found in the care plan section) Acute Rehab PT Goals Patient Stated Goal: not stated PT Goal Formulation: With patient/family Time For Goal Achievement: 10/02/15 Potential to Achieve Goals: Good Progress towards PT goals: Progressing toward goals (slowly)    Frequency  Min 5X/week    PT Plan Current plan remains appropriate    Co-evaluation  End of Session Equipment Utilized During Treatment: Gait belt;Cervical collar Activity Tolerance: Patient limited by fatigue Patient left: in chair;with call bell/phone within reach     Time: 1101-1120 PT Time Calculation (min) (ACUTE ONLY): 19 min  Charges:  $Therapeutic Activity: 8-22 mins                    G Codes:      Lorriane Shire 09/19/2015, 11:50 AM

## 2015-09-19 NOTE — Progress Notes (Signed)
OT Cancellation Note  Patient Details Name: Diana Henderson MRN: [id number] DOB: 1970-09-26   Cancelled Treatment:    Reason Eval/Treat Not Completed: Patient declined, no reason specified. Pt found in bed after just getting back to bed and RN placing foley catheter. Will check back as schedule allows for OT treatment, possibly tomorrow.   Chrys Racer , MS, OTR/L, CLT Pager: [phone]  09/19/2015, 12:59 PM

## 2015-09-19 NOTE — Care Management Important Message (Signed)
Important Message  Patient Details  Name: Diana Henderson MRN: [id number] Date of Birth: Mar 11, 1971   Medicare Important Message Given:  Yes    [name] [place] 09/19/2015, 1:11 PM

## 2015-09-19 NOTE — Progress Notes (Addendum)
Pt unable to void last night, needed straight cath. Continues to report neck soreness. Unchanged left arm.  EXAM:  BP 140/89 mmHg  Pulse 90  Temp(Src) 98.3 F (36.8 C) (Oral)  Resp 20  Ht 5\' 6"  (1.676 m)  Wt 138.432 kg (305 lb 3 oz)  BMI 49.28 kg/m2  SpO2 97%  LMP   Awake, alert, oriented  Speech fluent, appropriate  CN grossly intact  5/5 x mild left arm weakness stable from preop  IMPRESSION:  45 y.o. female s/p cervical lami/lat mass fusion, recovering slowly Hyperglycemia Postop urinary retention AKI  PLAN: - Cont to mobilize with PT/OT - Cont Flomax, may need to replace Foley if needs another cath - Appreciate medicine assistance for HTN, DM, AKI - changed to scheduled 5mg  valium Q8 for neck spasms

## 2015-09-19 NOTE — Progress Notes (Addendum)
Inpatient Diabetes Program Recommendations  AACE/ADA: New Consensus Statement on Inpatient Glycemic Control (2015)  Target Ranges:  Prepandial:   less than 140 mg/dL      Peak postprandial:   less than 180 mg/dL (1-2 hours)      Critically ill patients:  140 - 180 mg/dL   Review of Glycemic Control  Diabetes history: DM 2  Outpatient Diabetes medications: 70/30 35 units bid, invokana, janumet Current orders for Inpatient glycemic control: 70/30 35 units bid, resistant correction tidwc and HS correction scales. Consulted for uncontrolled DM. HgbA1C of 11.1%  Inpatient Diabetes Program Recommendations:    Glucose control while here on home dose of 70/30 and minimal doses of correction needed. Ordered dietician consult and education manual and assistance with watching the patient education videos on system network. Spoke with patient's daughter, [name] as patient herself was not awake enough to speak with me.   Discussed with daughter any problems that may be making her glucose control at home. She states that although she has [place] Medicaid and Medicare, her pharmacy told her that her glucometer strips were not covered by this insurance. Daughter states that due to this, patient is unable to determine what her glucose actually is other than how she feels.   Explained to her that Medicaid does indeed cover the cost of the strips and (if needed the meter) for the Accucheck meter. The strips are allotted according to the number of times the patient is instructed to check her blood sugars at home, up to 4 times/day. More than likely, the [facility] did not note to the patient that the strips are covered under [place] (DME) rather than medicine prescriptions.  On discharge, please order Accucheck meter with strips (checkig 4 times per day). The order number is [id number] for discharge and can be sent to her [facility] pharmacy on [address] with her other prescriptions. Alerted daughter  to instruct pharmacy that this is covered under her DME on Medicaid. Patient also is to continue [facility] services at home-will request that they assist patient with glucose control through surveillance of checking cbg's and diet adherence. Will make note to case manager as well.  Ad-Home meds list Wilder Glade and Invokana-both a SGLT-2 inhibitors for glucose control-patient should not be on both of them-Will speak with daughter/patient as to which she is taking currently and not to take both meds daily. Home meds also lists Novolin 70/30- 30 units bid. Medicaid also covers the Novolog 70/30 Mix and can use in pen form. Thank you Rosita Kea, RN, MSN, CDE  Diabetes Inpatient Program Office: [phone] Pager: [phone] 8:00 am to 5:00 pm

## 2015-09-20 LAB — RENAL FUNCTION PANEL
ANION GAP: 10 (ref 5–15)
Albumin: 2.3 g/dL — ABNORMAL LOW (ref 3.5–5.0)
BUN: 29 mg/dL — ABNORMAL HIGH (ref 6–20)
CALCIUM: 7.6 mg/dL — AB (ref 8.9–10.3)
CO2: 22 mmol/L (ref 22–32)
Chloride: 105 mmol/L (ref 101–111)
Creatinine, Ser: 2.58 mg/dL — ABNORMAL HIGH (ref 0.44–1.00)
GFR calc non Af Amer: 21 mL/min — ABNORMAL LOW (ref >60)
GFR, EST AFRICAN AMERICAN: 25 mL/min — AB (ref >60)
Glucose, Bld: 135 mg/dL — ABNORMAL HIGH (ref 65–99)
PHOSPHORUS: 4.6 mg/dL (ref 2.5–4.6)
Potassium: 3.8 mmol/L (ref 3.5–5.1)
SODIUM: 137 mmol/L (ref 135–145)

## 2015-09-20 LAB — GLUCOSE, CAPILLARY
GLUCOSE-CAPILLARY: 142 mg/dL — AB (ref 65–99)
GLUCOSE-CAPILLARY: 148 mg/dL — AB (ref 65–99)
Glucose-Capillary: 127 mg/dL — ABNORMAL HIGH (ref 65–99)
Glucose-Capillary: 136 mg/dL — ABNORMAL HIGH (ref 65–99)

## 2015-09-20 MED ORDER — INSULIN ASPART PROT & ASPART (70-30 MIX) 100 UNIT/ML ~~LOC~~ SUSP
30.0000 [IU] | Freq: Two times a day (BID) | SUBCUTANEOUS | Status: DC
  Administered 2015-09-20 – 2015-09-23 (×6): 30 [IU] via SUBCUTANEOUS
  Filled 2015-09-20: qty 10

## 2015-09-20 NOTE — Progress Notes (Addendum)
Occupational Therapy Treatment Patient Details Name: Diana Henderson MRN: [id number] DOB: 1971/01/11 Today's Date: 09/20/2015    History of present illness Pt is a 45 y.o. female s/p Cervical four - seven laminectomies with lateral mass fusion. She underwent ACDF approx 9 months ago. PMH consists of DM, HTN, PTSD, asthma, bipolar d/o, anxiety and depression, hyperlipidemia, OA, GERD, obesity.   OT comments  Pt and daughter report that friend will be able to care for pt at home. Continue to recommend HHOT upon d/c. Education provided in session.  Follow Up Recommendations  Home health OT;Supervision/Assistance - 24 hour    Equipment Recommendations  Other (comment) (AE)    Recommendations for Other Services      Precautions / Restrictions Precautions Precautions: Fall;Cervical Precaution Comments: educated on cervical precautions Required Braces or Orthoses: Cervical Brace Cervical Brace: Soft collar;At all times Restrictions Weight Bearing Restrictions: No       Mobility Bed Mobility Overal bed mobility: Needs Assistance Bed Mobility: Supine to Sit     Supine to sit: HOB elevated;Mod assist;+2 for physical assistance        Transfers Overall transfer level: Needs assistance   Transfers: Sit to/from Stand;Stand Pivot Transfers Sit to Stand: +2 safety/equipment;Min assist Stand pivot transfers: Min guard;+2 safety/equipment       General transfer comment: cues for technique.    Balance      Use of RW for stand pivot transfer and upon standing. +2 (for safety) Min guard for stand pivot transfer and +2 (for safety) Min assist for sit to stand transfer.                             ADL Overall ADL's : Needs assistance/impaired                     Lower Body Dressing: Total assistance;With adaptive equipment;Sitting/lateral leans Lower Body Dressing Details (indicate cue type and reason): donning/doffing sock Toilet Transfer: Minimal  assistance;Stand-pivot;RW;+2 for safety/equipment Toilet Transfer Details (indicate cue type and reason): Min assist-sit to stand; Min guard for stand pivot Toileting- Clothing Manipulation and Hygiene: Total assistance;Sit to/from stand;+2 for safety/equipment       Functional mobility during ADLs: Rolling walker (+2 Min guard for stand pivot; +2 Min assist for sit to stand) General ADL Comments: OT helped adjust cervical collar. Educated on AE for LB ADLs.  Pt practiced using reacher and sock aid. Educated on what pt could use for toilet aid for hygiene.      Vision                     Perception     Praxis      Cognition  Awake/Alert Behavior During Therapy: WFL for tasks assessed/performed;Flat affect Overall Cognitive Status:  (unsure of baseline) Area of Impairment: Problem solving              Problem Solving: Slow processing;Requires verbal cues      Extremity/Trunk Assessment               Exercises     Shoulder Instructions       General Comments      Pertinent Vitals/ Pain       Pain Assessment: 0-10 Pain Score: 8  Pain Location: upper back Pain Descriptors / Indicators:  (notified OT of pain) Pain Intervention(s): Monitored during session;Patient requesting pain meds-RN notified; repositioned  Home Living  Prior Functioning/Environment              Frequency Min 2X/week     Progress Toward Goals  OT Goals(current goals can now be found in the care plan section)  Progress towards OT goals: Progressing toward goals (stand pivot transfer)  Acute Rehab OT Goals Patient Stated Goal: not stated OT Goal Formulation: With patient Time For Goal Achievement: 09/25/15 Potential to Achieve Goals: Good ADL Goals Pt Will Perform Grooming: sitting;with min assist Pt Will Perform Upper Body Bathing: sitting;with min assist (with or without AE) Pt Will Perform Lower Body  Bathing: with min assist;with adaptive equipment;sit to/from stand Pt Will Perform Upper Body Dressing: sitting;with min assist Pt Will Perform Lower Body Dressing: with min assist;with adaptive equipment;sit to/from stand Pt Will Transfer to Toilet: with min guard assist;ambulating;bedside commode Pt Will Perform Toileting - Clothing Manipulation and hygiene: with min guard assist;sit to/from stand (with or without AE) Additional ADL Goal #1: Pt will perform UE exercises with supervision to increase bilateral UE strength.  Plan Discharge plan remains appropriate    Co-evaluation                 End of Session Equipment Utilized During Treatment: Gait belt;Oxygen;Cervical collar;Rolling walker;Other (comment) (AE)   Activity Tolerance Patient tolerated treatment well   Patient Left in chair;with call bell/phone within reach;with family/visitor present   Nurse Communication Patient requests pain meds        Time: [id number] OT Time Calculation (min): 22 min  Charges: OT General Charges $OT Visit: 1 Procedure OT Treatments $Self Care/Home Management : 8-22 mins  Benito Mccreedy OTR/L [id number] 09/20/2015, 11:40 AM

## 2015-09-20 NOTE — Progress Notes (Signed)
Physical Therapy Treatment Patient Details Name: Diana Henderson MRN: [id number] DOB: [birth year] Today's Date: 09/20/2015    History of Present Illness Pt is a 45 y.o. female s/p Cervical four - seven laminectomies with lateral mass fusion. She underwent ACDF approx 9 months ago. PMH consists of DM, HTN, PTSD, asthma, bipolar d/o, anxiety and depression, hyperlipidemia, OA, GERD, obesity.    PT Comments    Patient limited with tolerance to activity and unable to ambulate this session despite encouragement and obtaining wide rollator for improved fit.  Feel she will not be able to progress to home at this point and would recommend SNF level rehab at d/c.  Follow Up Recommendations  SNF     Equipment Recommendations  None recommended by PT    Recommendations for Other Services       Precautions / Restrictions Precautions Precautions: Fall;Cervical Precaution Comments: educated on cervical precautions Required Braces or Orthoses: Cervical Brace Cervical Brace: Soft collar;At all times Restrictions Weight Bearing Restrictions: No    Mobility  Bed Mobility Overal bed mobility: Needs Assistance Bed Mobility: Supine to Sit     Supine to sit: HOB elevated;Mod assist;+2 for physical assistance     General bed mobility comments: up in chair  Transfers Overall transfer level: Needs assistance Equipment used: 4-wheeled walker Transfers: Sit to/from [employer] Transfers Sit to Stand: +2 physical assistance;Mod assist Stand pivot transfers: +2 physical assistance;Min assist       General transfer comment: increased time, difficulty with anterior weight shift, in standing to step to [facility] and back to chair demonstrated UE weakness with losing upright posture and falling forward with uppertrunk.   Ambulation/Gait         Gait velocity: unable to ambulate safely with c/o severe pain and poor maintenance of upright standing with giving in of UE's and loss of trunk  support        Stairs            Wheelchair Mobility    Modified Rankin (Stroke Patients Only)       Balance Overall balance assessment: Needs assistance         Standing balance support: Bilateral upper extremity supported Standing balance-Leahy Scale: Poor Standing balance comment: unable to stand long, and not without UE support and physical help                    Cognition Arousal/Alertness: Lethargic Behavior During Therapy: Flat affect Overall Cognitive Status: Impaired/Different from baseline Area of Impairment: Problem solving;Following commands   Current Attention Level: Sustained   Following Commands: Follows one step commands with increased time     Problem Solving: Slow processing;Decreased initiation;Difficulty sequencing;Requires verbal cues;Requires tactile cues      Exercises      General Comments General comments (skin integrity, edema, etc.): sat on BSC for BM and continually repositioning due to pain and UE weakness      Pertinent Vitals/Pain Pain Assessment: 0-10 Pain Score: 8  Faces Pain Scale: Hurts whole lot Pain Location: upper mid back Pain Descriptors / Indicators: Aching Pain Intervention(s): Monitored during session;Repositioned;Limited activity within patient's tolerance    Home Living                      Prior Function            PT Goals (current goals can now be found in the care plan section) Acute Rehab PT Goals Patient Stated Goal: not stated Progress towards PT  goals: Not progressing toward goals - comment (poor activity tolerance)    Frequency  Min 5X/week    PT Plan Discharge plan needs to be updated    Co-evaluation             End of Session Equipment Utilized During Treatment: Gait belt;Cervical collar Activity Tolerance: Patient limited by fatigue;Patient limited by pain Patient left: in chair;with call bell/phone within reach;with family/visitor present     Time:  1310-1341 PT Time Calculation (min) (ACUTE ONLY): 31 min  Charges:  $Therapeutic Activity: 23-37 mins                    G Codes:      Reginia Naas 10/12/15, 2:15 PM  Magda Kiel, [place] Oct 12, 2015

## 2015-09-20 NOTE — Progress Notes (Signed)
Pt had to have Foley replaced due to retention yesterday.  EXAM:  BP 127/67 mmHg  Pulse 101  Temp(Src) 99.1 F (37.3 C) (Oral)  Resp 18  Ht 5\' 6"  (1.676 m)  Wt 138.432 kg (305 lb 3 oz)  BMI 49.28 kg/m2  SpO2 100%  LMP   Awake, alert, largely non-interactive CN grossly intact  5/5 BUE/BLE mild distal LUE weakness  IMPRESSION:  45 y.o. female s/p cervical lami/lat mass fusion, neurologically stable - Urinary retention likely multifactorial - AKI is improving - DM, glucose control appears better - Encephalopathy is also likely mutlifactorial  PLAN: - Cont to mobilize as tolerated - Cont current glucose control - Will likely leave Foley for another day or two before attempting trial of void, cont Flomax

## 2015-09-20 NOTE — Progress Notes (Signed)
[facility] Consultation Progress Note  Patient: Diana Henderson [id number]   PCP: Diana Mccreedy, MD DOB: 07/15/1970   DOA: 09/16/2015   DOS: 09/20/2015   Date of Service: the patient was seen and examined on 09/20/2015 Primary service: Consuella Lose, MD  Subjective: Patient has loose watery bowel movement. No abdominal pain no nausea and vomiting. Complains of the neck pain. Did have significant retention of the urine requiring Foley catheter. Nutrition: Adequate oral intake Activity: Bed to chair Last BM: 09/20/2015  Assessment and Plan: 1. Cervical spondylosis with myelopathy CBG last 24 hours well-controlled between 90-138 Continue NPH, reduce 30 twice a day. Continue moderate sliding scale Continue Linagliptin Continue to hold metformin in the setting of worsening renal function  2. Acute kidney injury. Acute urinary retention Renal function improving with IV hydration and medication changes Likely from poor oral intake. Reduce Oral intake likely from acute encephalopathy due to delirium due to severe pain as well as pain medications. Continue IV hydration at present. FENa 0.6, suggesting prerenal Avoid nephrotoxic medications. Pain medication has also been discontinued due to worsening renal function including nabumetone and Cymbalta  3. Obstructive sleep apnea. Unable to tolerate CPap due to c-collar, not consistently compliant at home as well.  4. Diastolic dysfunction. Monitor for volume overload in the setting of worsening renal function as well as IV fluids.  5. Mood disorder. Continuing home medications.  6. Essential hypertension. Currently holding all blood pressure medication in the setting of worsening renal function and normal blood pressure.  DVT Prophylaxis: mechanical compression device. Nutrition: Carb modified diet Thank you very much for involving [place] in care of your patient.   Disposition: We will continue to follow the patient.    Barriers to safe discharge: Stabilization of the renal function and Foley catheter disposition  Recommendation on discharge: Follow-up with PCP in one week to adjust insulin levels  Advance goals of care discussion: Full code from prior  Family Communication: family was present at bedside, at the time of interview.  Opportunity was given to ask question and all questions were answered satisfactorily.    Brief Summary of Hospitalization:  HPI: As per the note dictated on day of consult, "45 year old female patient with history of morbid obesity, diabetes on insulin, bipolar disorder and PTSD with associated anxiety, dyslipidemia, osteoarthritis, malignant hypertension, rate 1 diastolic dysfunction with mild LVH, untreated sleep apnea secondary to noncompliance with CPAP, and GERD. Patient was admitted on 3/31 to undergo elective C4-7 laminectomy secondary to decompression of spinal cord. Of note patient was seen in the ER 2 days prior to surgical procedure for uncontrolled diabetes. At that time her CBG was 473 and she was treated with IV fluids and additional insulin. Patient reports because of inability to obtain glucometer test strips she has been unable to check her sugars at home. Patient underwent her surgical procedure without difficulty but in the postoperative period had significant hyperglycemia and was managed initially on an insulin infusion but quickly transitioned over to her usual home regimen. We have been asked to assist in management of patient's medical problems primarily her diabetes." Procedures: Cervical laminectomy Antibiotics: Anti-infectives    Start     Dose/Rate Route Frequency Ordered Stop   09/17/15 1500  ceFAZolin (ANCEF) IVPB 2g/100 mL premix     2 g 200 mL/hr over 30 Minutes Intravenous Every 8 hours 09/17/15 1407 09/17/15 2340   09/16/15 1003  bacitracin 50,000 Units in sodium chloride irrigation 0.9 % 500 mL irrigation  Status:  Discontinued  As needed  09/16/15 1003 09/16/15 1215   09/16/15 0600  ceFAZolin (ANCEF) 3 g in dextrose 5 % 50 mL IVPB     3 g 130 mL/hr over 30 Minutes Intravenous On call to O.R. 09/15/15 1332 09/16/15 0850      Intake/Output Summary (Last 24 hours) at 09/20/15 1447 Last data filed at 09/20/15 0446  Gross per 24 hour  Intake      0 ml  Output   2200 ml  Net  -2200 ml   Filed Weights   09/16/15 0627  Weight: 138.432 kg (305 lb 3 oz)   Objective: Physical Exam: Filed Vitals:   09/20/15 0458 09/20/15 0734 09/20/15 1016 09/20/15 1423  BP: 116/62  127/67 129/65  Pulse: 100  101 108  Temp: 99 F (37.2 C)  99.1 F (37.3 C) 99.1 F (37.3 C)  TempSrc: Oral  Oral Oral  Resp: 18   18  Height:      Weight:      SpO2: 100% 99% 100% 100%    General: Appear in Mild distress, no Rash; Oral Mucosa moist. Cardiovascular: S1 and S2 Present, no Murmur Respiratory: Bilateral Air entry present and Clear to Auscultation, no Crackles, no wheezes Abdomen: Bowel Sound present, Soft and no tenderness Extremities: trace Pedal edema, no calf tenderness  Data Reviewed: CBC:  Recent Labs Lab 09/14/15 1721 09/19/15 0628  WBC 5.7 10.5  HGB 13.6 10.6*  HCT 42.4 34.4*  MCV 84.5 85.6  PLT 177 [id number]*   Basic Metabolic Panel:  Recent Labs Lab 09/14/15 1721 09/17/15 1914 09/18/15 1145 09/19/15 0628 09/20/15 0803  NA 132* 131* 131* 133* 137  K 4.2 4.8 4.8 4.4 3.8  CL 99* 96* 96* 97* 105  CO2 21* 23 24 25 22   GLUCOSE 333* 262* 196* 147* 135*  BUN 11 16 21* 29* 29*  CREATININE 1.03* 1.76* 2.79* 3.18* 2.58*  CALCIUM 8.9 8.6* 8.8* 8.1* 7.6*  PHOS  --   --   --   --  4.6   Liver Function Tests:  Recent Labs Lab 09/20/15 0803  ALBUMIN 2.3*   No results for input(s): LIPASE, AMYLASE in the last 168 hours. No results for input(s): AMMONIA in the last 168 hours.  Cardiac Enzymes: No results for input(s): CKTOTAL, CKMB, CKMBINDEX, TROPONINI in the last 168 hours. BNP (last 3 results) No results for  input(s): BNP in the last 8760 hours.  CBG:  Recent Labs Lab 09/19/15 1147 09/19/15 1629 09/19/15 2201 09/20/15 0602 09/20/15 1123  GLUCAP 112* 90 102* 127* 136*    Recent Results (from the past 240 hour(s))  Surgical pcr screen     Status: None   Collection Time: 09/13/15  1:47 PM  Result Value Ref Range Status   MRSA, PCR NEGATIVE NEGATIVE Final   Staphylococcus aureus NEGATIVE NEGATIVE Final    Comment:        The Xpert SA Assay (FDA approved for NASAL specimens in patients over 64 years of age), is one component of a comprehensive surveillance program.  Test performance has been validated by [facility] for patients greater than or equal to 47 year old. It is not intended to diagnose infection nor to guide or monitor treatment.      Studies: No results found.   Scheduled Meds: . budesonide (PULMICORT) nebulizer solution  0.25 mg Nebulization BID  . diazepam  5 mg Oral 3 times per day  . docusate sodium  100 mg Oral BID  . fluticasone  1 spray Each Nare BID  . gabapentin  600 mg Oral Daily  . insulin aspart  0-15 Units Subcutaneous TID WC  . insulin aspart  0-5 Units Subcutaneous QHS  . insulin aspart protamine- aspart  35 Units Subcutaneous BID WC  . lactulose  30 g Oral TID  . linagliptin  5 mg Oral Q breakfast  . mometasone-formoterol  2 puff Inhalation BID  . multivitamin with minerals  1 tablet Oral Daily  . pantoprazole  40 mg Oral Daily  . psyllium  1 packet Oral Daily  . risperiDONE  0.5 mg Oral Daily  . risperiDONE  1 mg Oral QHS  . senna  1 tablet Oral BID  . tamsulosin  0.4 mg Oral Daily  . trimethoprim-polymyxin b  1 drop Left Eye 6 times per day   Continuous Infusions: . sodium chloride 100 mL/hr at 09/19/15 1841   PRN Meds: acetaminophen **OR** acetaminophen, albuterol, HYDROmorphone (DILAUDID) injection, hydrOXYzine, ipratropium-albuterol, menthol-cetylpyridinium **OR** phenol, ondansetron (ZOFRAN) IV, ondansetron,  oxyCODONE-acetaminophen, polyethylene glycol  Time spent: 30 minutes  Author: Berle Mull, MD [facility] Pager: [phone] 09/20/2015 2:47 PM  If 7PM-7AM, please contact night-coverage at [email or web address], password [facility]

## 2015-09-21 DIAGNOSIS — Z794 Long term (current) use of insulin: Secondary | ICD-10-CM

## 2015-09-21 DIAGNOSIS — N179 Acute kidney failure, unspecified: Secondary | ICD-10-CM

## 2015-09-21 DIAGNOSIS — I10 Essential (primary) hypertension: Secondary | ICD-10-CM

## 2015-09-21 DIAGNOSIS — F319 Bipolar disorder, unspecified: Secondary | ICD-10-CM

## 2015-09-21 DIAGNOSIS — E1165 Type 2 diabetes mellitus with hyperglycemia: Secondary | ICD-10-CM

## 2015-09-21 DIAGNOSIS — E118 Type 2 diabetes mellitus with unspecified complications: Secondary | ICD-10-CM

## 2015-09-21 LAB — GLUCOSE, CAPILLARY
Glucose-Capillary: 127 mg/dL — ABNORMAL HIGH (ref 65–99)
Glucose-Capillary: 184 mg/dL — ABNORMAL HIGH (ref 65–99)
Glucose-Capillary: 183 mg/dL — ABNORMAL HIGH (ref 65–99)
Glucose-Capillary: 150 mg/dL — ABNORMAL HIGH (ref 65–99)

## 2015-09-21 LAB — RENAL FUNCTION PANEL
ALBUMIN: 2.3 g/dL — AB (ref 3.5–5.0)
ANION GAP: 10 (ref 5–15)
BUN: 25 mg/dL — AB (ref 6–20)
CALCIUM: 8.3 mg/dL — AB (ref 8.9–10.3)
CO2: 26 mmol/L (ref 22–32)
Chloride: 103 mmol/L (ref 101–111)
Creatinine, Ser: 1.77 mg/dL — ABNORMAL HIGH (ref 0.44–1.00)
GFR calc Af Amer: 39 mL/min — ABNORMAL LOW (ref >60)
GFR, EST NON AFRICAN AMERICAN: 33 mL/min — AB (ref >60)
GLUCOSE: 135 mg/dL — AB (ref 65–99)
PHOSPHORUS: 2.4 mg/dL — AB (ref 2.5–4.6)
Potassium: 3.7 mmol/L (ref 3.5–5.1)
SODIUM: 139 mmol/L (ref 135–145)

## 2015-09-21 NOTE — Progress Notes (Signed)
Pt has been in the chair less than 2 hours. rn explained to the pt daughter that pt would be placed back in bed after dinner.   Daughter asking for a note for her work saying that she "has been here in the hospital with her mother". rn stated that she would have to talk to charge nurse because she was not sure she was allowed to print out that kind of note. Anderson Malta, charge nurse at desk and explained to pt daughter that we were not allowed to write notes like of that sort and encouraged the daughter to look into FMLA.

## 2015-09-21 NOTE — Progress Notes (Signed)
Daughter came out to desk saying her mother was having chest pain. rn went into to assess pt. Pt touched across her whole chest when asked where pain was, pt reported pain started since after the surgery. Daughter thinks pain is from surgery and just muscle spasms

## 2015-09-21 NOTE — Progress Notes (Signed)
rn went to give pt tylenol. By the time rn got back into room pt was asleep. Pt would not open eyes to verbal stimuli. Will return tylenol.

## 2015-09-21 NOTE — Progress Notes (Addendum)
Patient having urgency with bowel movements needs immediate response to avoid accidents, lactulose is giving patient uncontrollable explosive diarrhea, please do not give her anymore lactulose.

## 2015-09-21 NOTE — Progress Notes (Signed)
[facility] PROGRESS NOTE  Diana Henderson [id number] DOB: [birth year] DOA: 09/16/2015  PCP: Benito Mccreedy, MD  Brief HPI: 45 year old African-American female with past medical history of diabetes on insulin, bipolar disorder, PTSD, hypertension, diastolic dysfunction, obesity, apnea, with noncompliance, underwent elective cervical laminectomy. Medicine was consulted for management of diabetes and renal failure.  Past medical history:  Past Medical History  Diagnosis Date  . Diabetes mellitus   . Hypertension   . Hyperlipidemia   . Depression   . PTSD (post-traumatic stress disorder)   . Pain in joint, ankle and foot 02/17/2013  . Anxiety   . Bipolar 1 disorder ([place])   . Sleep apnea     uses CPAP intermittently, pt. doesn't remember where she had the study  . Asthma     has been eval. by Dr. Gwenette Greet- last 06/2014  . Pneumonia     hosp. for a couple of day- not sure when it was   . GERD (gastroesophageal reflux disease)   . Headache     sometimes has migraines   . Arthritis     osteoarthritis   . OSA on CPAP   . Knee pain, chronic   . Shortness of breath dyspnea   . Complication of anesthesia     09/13/14: malignant HTN with inducction, case cx but no sourse identified; 11/05/14 remifentanyl infusion used to attenuate hypergynamic induction    Procedures: None  Antibiotics: None  Subjective: Patient overall feels better. Neck pain is improved. No complaints otherwise.  Objective: Vital Signs  Filed Vitals:   09/21/15 0133 09/21/15 0431 09/21/15 0755 09/21/15 1040  BP: 120/58 129/56  97/50  Pulse: 105 110  100  Temp: 98.3 F (36.8 C) 99.6 F (37.6 C)  98.6 F (37 C)  TempSrc: Oral Axillary  Oral  Resp: 16 18  20   Height:      Weight:      SpO2: 100% 100% 100% 100%    Intake/Output Summary (Last 24 hours) at 09/21/15 1322 Last data filed at 09/21/15 [id number]  Gross per 24 hour  Intake    120 ml  Output   2600 ml  Net  -2480 ml   Filed  Weights   09/16/15 0627  Weight: 138.432 kg (305 lb 3 oz)    General appearance: alert, cooperative, appears stated age and no distress Resp: Diminished air entry at the bases without any crackles, wheezing or rhonchi elsewhere. Cardio: regular rate and rhythm, S1, S2 normal, no murmur, click, rub or gallop GI: soft, non-tender; bowel sounds normal; no masses,  no organomegaly Extremities: extremities normal, atraumatic, no cyanosis or edema Neurologic: Awake and alert. Oriented 3. Somewhat distracted. No focal deficits.  Lab Results:  Basic Metabolic Panel:  Recent Labs Lab 09/17/15 1914 09/18/15 1145 09/19/15 0628 09/20/15 0803 09/21/15 0603  NA 131* 131* 133* 137 139  K 4.8 4.8 4.4 3.8 3.7  CL 96* 96* 97* 105 103  CO2 23 24 25 22 26   GLUCOSE 262* 196* 147* 135* 135*  BUN 16 21* 29* 29* 25*  CREATININE 1.76* 2.79* 3.18* 2.58* 1.77*  CALCIUM 8.6* 8.8* 8.1* 7.6* 8.3*  PHOS  --   --   --  4.6 2.4*   Liver Function Tests:  Recent Labs Lab 09/20/15 0803 09/21/15 0603  ALBUMIN 2.3* 2.3*   CBC:  Recent Labs Lab 09/14/15 1721 09/19/15 0628  WBC 5.7 10.5  HGB 13.6 10.6*  HCT 42.4 34.4*  MCV 84.5 85.6  PLT 177 143*  CBG:  Recent Labs Lab 09/20/15 1123 09/20/15 1654 09/20/15 2143 09/21/15 0640 09/21/15 1125  GLUCAP 136* 142* 148* 127* 184*    Recent Results (from the past 240 hour(s))  Surgical pcr screen     Status: None   Collection Time: 09/13/15  1:47 PM  Result Value Ref Range Status   MRSA, PCR NEGATIVE NEGATIVE Final   Staphylococcus aureus NEGATIVE NEGATIVE Final    Comment:        The Xpert SA Assay (FDA approved for NASAL specimens in patients over 41 years of age), is one component of a comprehensive surveillance program.  Test performance has been validated by [facility] for patients greater than or equal to 66 year old. It is not intended to diagnose infection nor to guide or monitor treatment.       Studies/Results: No  results found.  Medications:  Scheduled: . budesonide (PULMICORT) nebulizer solution  0.25 mg Nebulization BID  . diazepam  5 mg Oral 3 times per day  . docusate sodium  100 mg Oral BID  . fluticasone  1 spray Each Nare BID  . gabapentin  600 mg Oral Daily  . insulin aspart  0-15 Units Subcutaneous TID WC  . insulin aspart  0-5 Units Subcutaneous QHS  . insulin aspart protamine- aspart  30 Units Subcutaneous BID WC  . lactulose  30 g Oral TID  . linagliptin  5 mg Oral Q breakfast  . mometasone-formoterol  2 puff Inhalation BID  . multivitamin with minerals  1 tablet Oral Daily  . pantoprazole  40 mg Oral Daily  . psyllium  1 packet Oral Daily  . risperiDONE  0.5 mg Oral Daily  . risperiDONE  1 mg Oral QHS  . senna  1 tablet Oral BID  . tamsulosin  0.4 mg Oral Daily  . trimethoprim-polymyxin b  1 drop Left Eye 6 times per day   Continuous: . sodium chloride 100 mL/hr at 09/19/15 1841   [id number] **OR** acetaminophen, albuterol, HYDROmorphone (DILAUDID) injection, hydrOXYzine, ipratropium-albuterol, menthol-cetylpyridinium **OR** phenol, ondansetron (ZOFRAN) IV, ondansetron, oxyCODONE-acetaminophen, polyethylene glycol  Assessment/Plan:  Principal Problem:   Cervical spondylosis with myelopathy Active Problems:   Hyperlipidemia   OSA (obstructive sleep apnea)   HTN (hypertension), malignant   AKI (acute kidney injury) ([place])   Morbid obesity (H[facility])   Asthma   Left ventricular diastolic dysfunction, NYHA class 1   Uncontrolled type 2 diabetes mellitus with complication (HCC)   Chronic diastolic CHF (congestive heart failure), NYHA class 1 (HCC)   PTSD (post-traumatic stress disorder)   Bipolar I disorder (HCC)   Anxiety state    Cervical spondylosis with myelopathy Status post laminectomy. Management per neurosurgery.  Diabetes mellitus type 2 Dose of her insulin had to be reduced due to low blood sugars. She is currently on NPH and linagliptin. Metformin is on  hold due to renal failure. ABGs are reasonably well controlled. Continue current dosage. HbA1c was 11.1 in March.  Acute kidney injury  Likely secondary to Acute urinary retention. Foley catheter was placed with good urine output. Watch for postobstructive diuresis. Renal function is improving. Voiding trial in the next 24-36 hours. Continue IV fluids. Avoid nephrotoxic medications. Pain medication has also been discontinued due to worsening renal function including nabumetone and Cymbalta  Obstructive sleep apnea. Unable to tolerate CPap due to c-collar, not consistently compliant at home as well.  Chronic Diastolic dysfunction. Monitor for volume overload in the setting of worsening renal function as well as IV fluids.  Mood disorder/bipolar disorder/PTSD Stable. Continuing home medications.  Essential hypertension. Currently holding all blood pressure medications. Blood pressure is reasonably well controlled.  DVT Prophylaxis: SCD's       LOS: 5 days   [facility] Pager [phone] 09/21/2015, 1:22 PM  If 7PM-7AM, please contact night-coverage at [email or web address], password [facility]

## 2015-09-21 NOTE — Progress Notes (Signed)
Physical Therapy Treatment Patient Details Name: Diana Henderson MRN: [id number] DOB: [birth year] Today's Date: 09/21/2015    History of Present Illness Pt is a 45 y.o. female s/p Cervical four - seven laminectomies with lateral mass fusion. She underwent ACDF approx 9 months ago. PMH consists of DM, HTN, PTSD, asthma, bipolar d/o, anxiety and depression, hyperlipidemia, OA, GERD, obesity.    PT Comments    Pt with extremely slow progression towards goals. Pt with severe lethargy and had difficulty with arousing patient. Pt with significant L UE weakness. Pt attempted to feed self with L UE and dropped spoon x3. Pt unable to hold cup to drink. Pt to benefit from ST-SNF upon d/c for maximal functional recovery.  Follow Up Recommendations  SNF     Equipment Recommendations  None recommended by PT    Recommendations for Other Services       Precautions / Restrictions Precautions Precautions: Fall;Cervical Precaution Comments: educated on cervical precautions Required Braces or Orthoses: Cervical Brace Cervical Brace: Soft collar;At all times Restrictions Weight Bearing Restrictions: No    Mobility  Bed Mobility Overal bed mobility: Needs Assistance Bed Mobility: Rolling;Sidelying to Sit Rolling: Mod assist;+2 for physical assistance Sidelying to sit: Mod assist;+2 for physical assistance;HOB elevated       General bed mobility comments: max directional verbal and tactile cues  Transfers Overall transfer level: Needs assistance Equipment used: Rolling walker (2 wheeled) Transfers: Sit to/from [facility] Sit to Stand: Min assist;+2 physical assistance Stand pivot transfers: Min assist;Mod assist;+2 physical assistance       General transfer comment: increased time, wide base of support, labored effort, + SOB  Ambulation/Gait             General Gait Details: able to march in place x 10 reps   Stairs            Wheelchair Mobility     Modified Rankin (Stroke Patients Only)       Balance Overall balance assessment: Needs assistance Sitting-balance support: Feet supported;Single extremity supported Sitting balance-Leahy Scale: Poor Sitting balance - Comments: pt unable to sit without UE support due to pain, relying on R UE for support, unable to use L UE   Standing balance support: Bilateral upper extremity supported Standing balance-Leahy Scale: Poor Standing balance comment: requires external support to maintain standing                    Cognition Arousal/Alertness: Lethargic Behavior During Therapy: Flat affect Overall Cognitive Status: Impaired/Different from baseline               Problem Solving: Slow processing;Requires verbal cues;Requires tactile cues      Exercises      General Comments General comments (skin integrity, edema, etc.): pt assisted to bsc. pt unable to perform hygiene s/p loose stool. pt stood x 2 min for hygiene      Pertinent Vitals/Pain Pain Assessment: 0-10 Pain Score: 8  Pain Location: upper mid back and chest Pain Descriptors / Indicators: Aching Pain Intervention(s): Monitored during session    Home Living                      Prior Function            PT Goals (current goals can now be found in the care plan section) Acute Rehab PT Goals Patient Stated Goal: stop hurting (but pt was sleeping for hours) Progress towards PT goals: Progressing toward goals (slowly)  Frequency  Min 5X/week    PT Plan Current plan remains appropriate    Co-evaluation             End of Session Equipment Utilized During Treatment: Gait belt;Cervical collar Activity Tolerance: Patient limited by fatigue;Patient limited by lethargy Patient left: in chair;with call bell/phone within reach     Time: 1430-1513 PT Time Calculation (min) (ACUTE ONLY): 43 min  Charges:  $Therapeutic Activity: 38-52 mins                    G Codes:       Kingsley Callander 09/21/2015, 4:34 PM   Kittie Plater, PT, DPT Pager #: [phone] Office #: [phone]

## 2015-09-21 NOTE — Progress Notes (Signed)
Patient has refused cpap tonight. Patient states she has too much going on right now. Patient in no disress

## 2015-09-21 NOTE — Progress Notes (Addendum)
Pt very lethargic, barely opens eyes to loud verbal stimuli and physical stimuli. Pt will weakly squeeze rn hands and will barely plantar flex upon command. Falls back asleep immediately.   Pt received 2 percocet at 0622,  Received gabapentin and risperdal at 1055, has not received any other narcotics or sedating medications.

## 2015-09-21 NOTE — Progress Notes (Signed)
No issues overnight. Pt appears more responsive today. Cont to c/o neck soreness, but is eager to go home.  EXAM:  BP 97/50 mmHg  Pulse 100  Temp(Src) 98.6 F (37 C) (Oral)  Resp 20  Ht 5\' 6"  (1.676 m)  Wt 138.432 kg (305 lb 3 oz)  BMI 49.28 kg/m2  SpO2 100%  LMP   Awake, alert, oriented  Speech fluent, appropriate  CN grossly intact  5/5 BUE/BLE x mild distal LUE weakness  IMPRESSION:  45 y.o. female s/p posterior cervical decompression/fusion - AKI improving - Urinary retention - DM appears better controlled  PLAN: - Will d/c foley, trial of void - May need SNF placement

## 2015-09-21 NOTE — Progress Notes (Signed)
Pt refusing 1600 dose of lactulose, reports she "pooped all over herself" earlier from the previous dose of lactulose.

## 2015-09-22 LAB — RENAL FUNCTION PANEL
ANION GAP: 13 (ref 5–15)
Albumin: 2.7 g/dL — ABNORMAL LOW (ref 3.5–5.0)
BUN: 15 mg/dL (ref 6–20)
CALCIUM: 8.8 mg/dL — AB (ref 8.9–10.3)
CHLORIDE: 102 mmol/L (ref 101–111)
CO2: 26 mmol/L (ref 22–32)
CREATININE: 1.09 mg/dL — AB (ref 0.44–1.00)
GFR, EST NON AFRICAN AMERICAN: 60 mL/min — AB (ref >60)
Glucose, Bld: 160 mg/dL — ABNORMAL HIGH (ref 65–99)
Phosphorus: 2.1 mg/dL — ABNORMAL LOW (ref 2.5–4.6)
Potassium: 3.9 mmol/L (ref 3.5–5.1)
Sodium: 141 mmol/L (ref 135–145)

## 2015-09-22 LAB — GLUCOSE, CAPILLARY
GLUCOSE-CAPILLARY: 160 mg/dL — AB (ref 65–99)
GLUCOSE-CAPILLARY: 160 mg/dL — AB (ref 65–99)
Glucose-Capillary: 152 mg/dL — ABNORMAL HIGH (ref 65–99)
Glucose-Capillary: 107 mg/dL — ABNORMAL HIGH (ref 65–99)

## 2015-09-22 MED ORDER — POLYETHYLENE GLYCOL 3350 17 G PO PACK: 17.0000 g | PACK | Freq: Every day | ORAL | Status: DC | PRN

## 2015-09-22 MED ORDER — LACTULOSE 10 GM/15ML PO SOLN
30.0000 g | Freq: Two times a day (BID) | ORAL | Status: DC
  Filled 2015-09-22: qty 45

## 2015-09-22 MED ORDER — LINAGLIPTIN 5 MG PO TABS: 5.0000 mg | ORAL_TABLET | Freq: Every day | ORAL | Status: DC

## 2015-09-22 MED ORDER — ACETAMINOPHEN 325 MG PO TABS: 650.0000 mg | ORAL_TABLET | ORAL | Status: DC | PRN

## 2015-09-22 MED ORDER — LACTULOSE 10 GM/15ML PO SOLN: 30.0000 g | Freq: Two times a day (BID) | ORAL | Status: DC

## 2015-09-22 NOTE — Clinical Social Work Note (Signed)
Daughter states will tour [facility] and [facility] and call CSW with bed choice.  Nonnie Done, LCSW [phone]  5N1-9; 2S 15-16 and [place] Licensed Clinical Social Worker

## 2015-09-22 NOTE — NC FL2 (Signed)
[place] LEVEL OF CARE SCREENING TOOL     IDENTIFICATION  Patient Name: Diana Henderson Birthdate: Oct 29, 1970 Sex: female Admission Date (Current Location): 09/16/2015  [facility] and [place] Number:  Herbalist and Address:  [facility] [place] [address] [place], [place] [address]      Provider Number: [id number]  Attending Physician Name and Address:  Consuella Lose, MD  Relative Name and Phone Number:       Current Level of Care: Hospital Recommended Level of Care: [facility] Prior Approval Number:    Date Approved/Denied:   PASRR Number: [id number]  Discharge Plan: SNF    Current Diagnoses: Patient Active Problem List   Diagnosis Date Noted  . Diabetes mellitus, insulin dependent (IDDM), uncontrolled ([place]) 09/18/2015  . AKI (acute kidney injury) ([place]) 09/18/2015  . Morbid obesity ([place]) 09/18/2015  . Asthma 09/18/2015  . Left ventricular diastolic dysfunction, NYHA class 1 09/18/2015  . Uncontrolled type 2 diabetes mellitus with complication ([place])   . Chronic diastolic CHF (congestive heart failure), NYHA class 1 ([place])   . PTSD (post-traumatic stress disorder)   . Bipolar I disorder ([place])   . Anxiety state   . Knee pain, chronic   . Cervical spondylosis with myelopathy 11/05/2014  . HTN (hypertension), malignant 09/08/2014  . HNP (herniated nucleus pulposus) with myelopathy, cervical 06/29/2014  . Deformity of metatarsal bone of right foot 06/28/2014  . CTS (carpal tunnel syndrome) 05/04/2014  . OSA (obstructive sleep apnea) 05/04/2014  . Hereditary and idiopathic peripheral neuropathy 05/04/2014  . Ganglion cyst 03/10/2014  . Vision loss, left eye 02/18/2014  . Conjunctivitis 02/17/2014  . Asthma exacerbation 01/13/2014  . Abdominal pain, unspecified site 10/28/2013  . Injury of foot, right, superficial 10/23/2013  . Tenosynovitis of foot 08/17/2013  . Pain in joint, ankle and foot  02/17/2013  . Edema of foot 02/17/2013  . Status post foot surgery 09/09/2012  . Hyperlipidemia   . Major depressive disorder, recurrent, 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[place]) 02/25/2012    Orientation RESPIRATION BLADDER Height & Weight     Self, Time, Situation, Place  Normal Continent Weight: (!) 305 lb 3 oz (138.432 kg) Height:  5\' 6"  (167.6 cm)  BEHAVIORAL SYMPTOMS/MOOD NEUROLOGICAL BOWEL NUTRITION STATUS      Continent Diet (Please see discharge summary.)  AMBULATORY STATUS COMMUNICATION OF NEEDS Skin    (Unable to ambulate with PT due to pain) Verbally Surgical wounds                       Personal Care Assistance Level of Assistance  Bathing, Feeding, Dressing Bathing Assistance: Maximum assistance Feeding assistance: Limited assistance Dressing Assistance: Maximum assistance     Functional Limitations Info             SPECIAL CARE FACTORS FREQUENCY  PT (By licensed PT), OT (By licensed OT)                    Contractures      Additional Factors Info  Code Status, Allergies Code Status Info: FULL Allergies Info: Celery Oil, Pork-derived Products, Latex           Current Medications (09/22/2015):  This is the current hospital active medication list Current Facility-Administered Medications  Medication Dose Route Frequency Provider Last Rate Last Dose  . 0.9 %  sodium chloride infusion   Intravenous Continuous Samella Parr, NP 100 mL/hr at 09/21/15 2352 1,000 mL at  09/21/15 2352  . acetaminophen (TYLENOL) tablet 650 mg  650 mg Oral Q4H PRN Consuella Lose, MD   650 mg at 09/22/15 0501   Or  . acetaminophen (TYLENOL) suppository 650 mg  650 mg Rectal Q4H PRN Consuella Lose, MD      . albuterol (PROVENTIL) (2.5 MG/3ML) 0.083% nebulizer solution 2.5 mg  2.5 mg Inhalation Q6H PRN Consuella Lose, MD      . budesonide (PULMICORT) nebulizer solution 0.25 mg  0.25 mg Nebulization BID Consuella Lose, MD   0.25 mg at 09/22/15 0926  . docusate sodium  (COLACE) capsule 100 mg  100 mg Oral BID Consuella Lose, MD   100 mg at 09/21/15 1004  . fluticasone (FLONASE) 50 MCG/ACT nasal spray 1 spray  1 spray Each Nare BID Consuella Lose, MD   1 spray at 09/22/15 1022  . gabapentin (NEURONTIN) tablet 600 mg  600 mg Oral Daily Lavina Hamman, MD   600 mg at 09/22/15 0807  . insulin aspart (novoLOG) injection 0-15 Units  0-15 Units Subcutaneous TID WC Lavina Hamman, MD   3 Units at 09/22/15 [phone]  . insulin aspart (novoLOG) injection 0-5 Units  0-5 Units Subcutaneous QHS Lavina Hamman, MD   0 Units at 09/19/15 2231  . insulin aspart protamine- aspart (NOVOLOG MIX 70/30) injection 30 Units  30 Units Subcutaneous BID WC Lavina Hamman, MD   30 Units at 09/22/15 [phone]  . ipratropium-albuterol (DUONEB) 0.5-2.5 (3) MG/3ML nebulizer solution 3 mL  3 mL Nebulization Q4H PRN Samella Parr, NP      . lactulose (CHRONULAC) 10 GM/15ML solution 30 g  30 g Oral BID [name], MD   30 g at 09/22/15 1021  . linagliptin (TRADJENTA) tablet 5 mg  5 mg Oral Q breakfast Consuella Lose, MD   5 mg at 09/22/15 0808  . menthol-cetylpyridinium (CEPACOL) lozenge 3 mg  1 lozenge Oral PRN Consuella Lose, MD   3 mg at 09/18/15 0129   Or  . phenol (CHLORASEPTIC) mouth spray 1 spray  1 spray Mouth/Throat PRN Consuella Lose, MD      . mometasone-formoterol (DULERA) 200-5 MCG/ACT inhaler 2 puff  2 puff Inhalation BID Consuella Lose, MD   2 puff at 09/22/15 0927  . multivitamin with minerals tablet 1 tablet  1 tablet Oral Daily Consuella Lose, MD   1 tablet at 09/22/15 1017  . ondansetron (ZOFRAN) injection 4 mg  4 mg Intravenous Q4H PRN Consuella Lose, MD      . ondansetron (ZOFRAN-ODT) disintegrating tablet 8 mg  8 mg Oral Q8H PRN Consuella Lose, MD      . pantoprazole (PROTONIX) EC tablet 40 mg  40 mg Oral Daily Consuella Lose, MD   40 mg at 09/22/15 1017  . polyethylene glycol (MIRALAX / GLYCOLAX) packet 17 g  17 g Oral Daily PRN Consuella Lose, MD       . psyllium (HYDROCIL/METAMUCIL) packet 1 packet  1 packet Oral Daily Consuella Lose, MD   1 packet at 09/21/15 1004  . senna (SENOKOT) tablet 8.6 mg  1 tablet Oral BID Consuella Lose, MD   8.6 mg at 09/21/15 1004  . tamsulosin (FLOMAX) capsule 0.4 mg  0.4 mg Oral Daily Kevan Ny Ditty, MD   0.4 mg at 09/22/15 1017  . trimethoprim-polymyxin b (POLYTRIM) ophthalmic solution 1 drop  1 drop Left Eye 6 times per day Consuella Lose, MD   1 drop at 09/22/15 0359     Discharge Medications: Please  see discharge summary for a list of discharge medications.  Relevant Imaging Results:  Relevant Lab Results:   Additional Information SSN: [id number]  Caroline Sauger, LCSW

## 2015-09-22 NOTE — Progress Notes (Signed)
No issues overnight. More awake, able to ambulate in hallway today. Foley removed, able to void spontaneously.  EXAM:  BP 138/63 mmHg  Pulse 98  Temp(Src) 98.4 F (36.9 C) (Oral)  Resp 20  Ht 5\' 6"  (1.676 m)  Wt 138.432 kg (305 lb 3 oz)  BMI 49.28 kg/m2  SpO2 98%  LMP   Awake, alert, oriented  Speech fluent, appropriate  CN grossly intact  5/5 BUE/BLE stable mild distal LUE weakness  IMPRESSION:  45 y.o. female s/p cervical lam/fusion, progressing slowly - AKI improved - DM, sugars well controlled currently - Urinary retention appears to have improved  PLAN: - Plan on SNF placement when bed available.

## 2015-09-22 NOTE — Progress Notes (Signed)
Occupational Therapy Treatment Patient Details Name: Diana Henderson MRN: [id number] DOB: 08/01/1970 Today's Date: 09/22/2015    History of present illness Pt is a 45 y.o. female s/p Cervical four - seven laminectomies with lateral mass fusion. She underwent ACDF approx 9 months ago. PMH consists of DM, HTN, PTSD, asthma, bipolar d/o, anxiety and depression, hyperlipidemia, OA, GERD, obesity.   OT comments  Pt making gradual progress toward OT goals today; able to increase distance of functional mobility but requires max verbal encouragement to participate. Pt continues to require min assist +2 for stand pivot transfers to [facility]; pt only able to perform stand pivot this session secondary to urgency of bowel. Total assist provided for peri care. Updated d/c plan to SNF for further rehab prior to return home. Will continue to follow pt acutely.   Follow Up Recommendations  SNF;Supervision/Assistance - 24 hour    Equipment Recommendations  Other (comment) (AE)    Recommendations for Other Services      Precautions / Restrictions Precautions Precautions: Fall;Cervical Required Braces or Orthoses: Cervical Brace Cervical Brace: Soft collar;At all times Restrictions Weight Bearing Restrictions: No       Mobility Bed Mobility Overal bed mobility: Needs Assistance Bed Mobility: Rolling;Sidelying to Sit Rolling: Min assist Sidelying to sit: Mod assist;+2 for physical assistance;HOB elevated       General bed mobility comments: cues for technique  Transfers Overall transfer level: Needs assistance Equipment used: Rolling walker (2 wheeled) Transfers: Sit to/from [facility] Sit to Stand: Min assist;+2 safety/equipment Stand pivot transfers: Min assist;+2 safety/equipment       General transfer comment: Pt needed to use BSC due to diarrhea. Then transferred back to EOB prior to gait.    Balance Overall balance assessment: Needs assistance Sitting-balance  support: Feet supported;Bilateral upper extremity supported Sitting balance-Leahy Scale: Poor Sitting balance - Comments: Pt requires UE support and tends to lean sideways and not hold herself upright, pain vs. fatigue?   Standing balance support: Bilateral upper extremity supported;During functional activity Standing balance-Leahy Scale: Poor Standing balance comment: Requires RW for support                   ADL Overall ADL's : Needs assistance/impaired                         Toilet Transfer: Minimal assistance;+2 for safety/equipment;Stand-pivot;BSC;RW Toilet Transfer Details (indicate cue type and reason): Urgency of bowel; therefore performed stand pivot transfer to [facility] Toileting- Clothing Manipulation and Hygiene: Total assistance;+2 for safety/equipment;Sit to/from stand       Functional mobility during ADLs: Minimal assistance;+2 for safety/equipment;Rolling walker General ADL Comments: Pt with difficulty maintaining cervical precautions throughtout sesseion; tends to want to lean anteriorly or laterally, states it makes her back/neck feel better. Educated on need for upright posture and maintaining precautions.      Vision                     Perception     Praxis      Cognition   Behavior During Therapy: Flat affect Overall Cognitive Status: Impaired/Different from baseline                Problem Solving: Slow processing;Requires verbal cues;Requires tactile cues      Extremity/Trunk Assessment               Exercises     Shoulder Instructions       General Comments  Pertinent Vitals/ Pain       Pain Assessment: Faces Faces Pain Scale: Hurts even more Pain Location: upper back, incision, and lower back Pain Descriptors / Indicators: Spasm Pain Intervention(s): Limited activity within patient's tolerance;Monitored during session;Repositioned  Home Living                                           Prior Functioning/Environment              Frequency Min 2X/week     Progress Toward Goals  OT Goals(current goals can now be found in the care plan section)  Progress towards OT goals: Progressing toward goals  Acute Rehab OT Goals Patient Stated Goal: get back in bed OT Goal Formulation: With patient  Plan Discharge plan needs to be updated    Co-evaluation    PT/OT/SLP Co-Evaluation/Treatment: Yes Reason for Co-Treatment: For patient/therapist safety PT goals addressed during session: Mobility/safety with mobility;Proper use of DME OT goals addressed during session: ADL's and self-care;Proper use of Adaptive equipment and DME;Other (comment) (mobility)      End of Session Equipment Utilized During Treatment: Gait belt;Rolling walker;Cervical collar   Activity Tolerance Patient tolerated treatment well   Patient Left in chair;with call bell/phone within reach;with chair alarm set;with family/visitor present   Nurse Communication Other (comment) (pt had BM during session)        Time: [id number] OT Time Calculation (min): 33 min  Charges: OT General Charges $OT Visit: 1 Procedure OT Treatments $Self Care/Home Management : 8-22 mins  Binnie Kand M.S., OTR/L Pager: [phone]  09/22/2015, 12:23 PM

## 2015-09-22 NOTE — Care Management Note (Signed)
Case Management Note  Patient Details  Name: Diana Henderson MRN: [id number] Date of Birth: 1971-05-06  Subjective/Objective:                    Action/Plan: Plan is for SNF for rehab at d/c. CM will continue to follow for d/c needs.   Expected Discharge Date:                  Expected Discharge Plan:     In-House Referral:     Discharge planning Services     Post Acute Care Choice:    Choice offered to:     DME Arranged:    DME Agency:     HH Arranged:    HH Agency:     Status of Service:  In process, will continue to follow  Medicare Important Message Given:  Yes Date Medicare IM Given:    Medicare IM give by:    Date Additional Medicare IM Given:    Additional Medicare Important Message give by:     If discussed at [place] of Stay Meetings, dates discussed:    Additional Comments:  Pollie Friar, RN 09/22/2015, 2:54 PM

## 2015-09-22 NOTE — Care Management Important Message (Signed)
Important Message  Patient Details  Name: Diana Henderson MRN: [id number] Date of Birth: 25-Jun-1970   Medicare Important Message Given:  Yes    [name] 09/22/2015, 12:45 PM

## 2015-09-22 NOTE — Clinical Social Work Note (Signed)
Clinical Social Work Assessment  Patient Details  Name: Diana Henderson MRN: [id number] Date of Birth: 04/10/1971  Date of referral:  09/22/15               Reason for consult:  Facility Placement                Permission sought to share information with:  Chartered certified accountant granted to share information::  Yes, Verbal Permission Granted  Name::      (daughter danielle)  Agency::   [facility])  Relationship::     Contact Information:     Housing/Transportation Living arrangements for the past 2 months:  [place] of Information:  Patient, Adult Children Patient Interpreter Needed:  None Criminal Activity/Legal Involvement Pertinent to Current Situation/Hospitalization:  No - Comment as needed Significant Relationships:  Adult Children Lives with:    Do you feel safe going back to the place where you live?  Yes Need for family participation in patient care:  Yes (Comment) (patient requests daughter's involvement)  Care giving concerns:  Adult daughter, Diana Henderson, states she does not want to be without her mother.  [name] afraid mother will not be able to return home.  CSW offered support and education surrounding projected need for STR.   Social Worker assessment / plan:  CSW spoke with patient along with daughter, Diana Henderson regarding placement.  Patient is alert and oriented though she wishes for daughter Diana Henderson to be a part of the decision making for placement.  Patient and daughter are aware of PT recommendations and are both agreeable.  Patient and daughter live together at home.  Patient and daughter are hopeful for a full recovery though realizes that STR is needed.  Employment status:  Disabled (Comment on whether or not currently receiving Disability) Insurance information:  Medicare PT Recommendations:  [place] / Referral to community resources:  [place]  Patient/Family's  Response to care:  Patient and daughter are both disappointed that STR was needed instead of returning home at dc, but both remain agreeable.  Patient/Family's Understanding of and Emotional Response to Diagnosis, Current Treatment, and Prognosis:  Both patient and daughter remain realistic regarding level of care needed at time of discharge.  Emotional Assessment Appearance:  Appears older than stated age Attitude/Demeanor/Rapport:    Affect (typically observed):  Accepting Orientation:  Oriented to Self, Oriented to Place, Oriented to  Time, Oriented to Situation Alcohol / Substance use:    Psych involvement (Current and /or in the community):  No (Comment)  Discharge Needs  Concerns to be addressed:  No discharge needs identified Readmission within the last 30 days:  No Current discharge risk:  None Barriers to Discharge:  Continued Medical Work up   [employer], LCSW 09/22/2015, 12:40 PM

## 2015-09-22 NOTE — Progress Notes (Signed)
Patient refusing CPAP for tonight, states with her brace and everything she does not need it and that she has been fine without

## 2015-09-22 NOTE — Progress Notes (Signed)
[facility] PROGRESS NOTE  Diana Henderson [id number] DOB: 06-26-1970 DOA: 09/16/2015  PCP: Benito Mccreedy, MD  Brief HPI: 45 year old African-American female with past medical history of diabetes on insulin, bipolar disorder, PTSD, hypertension, diastolic dysfunction, obesity, apnea, with noncompliance, underwent elective cervical laminectomy. Medicine was consulted for management of diabetes and renal failure.  Past medical history:  Past Medical History  Diagnosis Date  . Diabetes mellitus   . Hypertension   . Hyperlipidemia   . Depression   . PTSD (post-traumatic stress disorder)   . Pain in joint, ankle and foot 02/17/2013  . Anxiety   . Bipolar 1 disorder ([place])   . Sleep apnea     uses CPAP intermittently, pt. doesn't remember where she had the study  . Asthma     has been eval. by Dr. Gwenette Greet- last 06/2014  . Pneumonia     hosp. for a couple of day- not sure when it was   . GERD (gastroesophageal reflux disease)   . Headache     sometimes has migraines   . Arthritis     osteoarthritis   . OSA on CPAP   . Knee pain, chronic   . Shortness of breath dyspnea   . Complication of anesthesia     09/13/14: malignant HTN with inducction, case cx but no sourse identified; 11/05/14 remifentanyl infusion used to attenuate hypergynamic induction     Antibiotics: None  Subjective: Patient with multiple complaints including neck pain or back pain. Neck pain radiates to the chest.   Objective: Vital Signs  Filed Vitals:   09/22/15 0432 09/22/15 0841 09/22/15 0928 09/22/15 0930  BP: 174/86 146/65    Pulse: 100 96    Temp: 98.9 F (37.2 C) 98.6 F (37 C)    TempSrc: Oral Oral    Resp: 18 20    Height:      Weight:      SpO2: 100% 100% 93% 93%    Intake/Output Summary (Last 24 hours) at 09/22/15 1309 Last data filed at 09/22/15 0410  Gross per 24 hour  Intake      0 ml  Output   2225 ml  Net  -2225 ml   Filed Weights   09/16/15 0627  Weight:  138.432 kg (305 lb 3 oz)    General appearance: alert, cooperative, appears stated age and no distress Resp: Diminished air entry at the bases without any crackles, wheezing or rhonchi elsewhere. Cardio: regular rate and rhythm, S1, S2 normal, no murmur, click, rub or gallop GI: soft, non-tender; bowel sounds normal; no masses,  no organomegaly Extremities: extremities normal, atraumatic, no cyanosis or edema Neurologic: Awake and alert. Oriented 3. Somewhat distracted. No focal deficits. Able to move both her lower extremities.  Lab Results:  Basic Metabolic Panel:  Recent Labs Lab 09/18/15 1145 09/19/15 0628 09/20/15 0803 09/21/15 0603 09/22/15 0902  NA 131* 133* 137 139 141  K 4.8 4.4 3.8 3.7 3.9  CL 96* 97* 105 103 102  CO2 24 25 22 26 26   GLUCOSE 196* 147* 135* 135* 160*  BUN 21* 29* 29* 25* 15  CREATININE 2.79* 3.18* 2.58* 1.77* 1.09*  CALCIUM 8.8* 8.1* 7.6* 8.3* 8.8*  PHOS  --   --  4.6 2.4* 2.1*   Liver Function Tests:  Recent Labs Lab 09/20/15 0803 09/21/15 0603 09/22/15 0902  ALBUMIN 2.3* 2.3* 2.7*   CBC:  Recent Labs Lab 09/19/15 0628  WBC 10.5  HGB 10.6*  HCT 34.4*  MCV 85.6  PLT 143*    CBG:  Recent Labs Lab 09/21/15 1125 09/21/15 1635 09/21/15 2128 09/22/15 0655 09/22/15 1133  GLUCAP 184* 183* 150* 160* 152*    Recent Results (from the past 240 hour(s))  Surgical pcr screen     Status: None   Collection Time: 09/13/15  1:47 PM  Result Value Ref Range Status   MRSA, PCR NEGATIVE NEGATIVE Final   Staphylococcus aureus NEGATIVE NEGATIVE Final    Comment:        The Xpert SA Assay (FDA approved for NASAL specimens in patients over 8 years of age), is one component of a comprehensive surveillance program.  Test performance has been validated by [facility] for patients greater than or equal to 29 year old. It is not intended to diagnose infection nor to guide or monitor treatment.       Studies/Results: No results  found.  Medications:  Scheduled: . budesonide (PULMICORT) nebulizer solution  0.25 mg Nebulization BID  . docusate sodium  100 mg Oral BID  . fluticasone  1 spray Each Nare BID  . gabapentin  600 mg Oral Daily  . insulin aspart  0-15 Units Subcutaneous TID WC  . insulin aspart  0-5 Units Subcutaneous QHS  . insulin aspart protamine- aspart  30 Units Subcutaneous BID WC  . lactulose  30 g Oral BID  . linagliptin  5 mg Oral Q breakfast  . mometasone-formoterol  2 puff Inhalation BID  . multivitamin with minerals  1 tablet Oral Daily  . pantoprazole  40 mg Oral Daily  . psyllium  1 packet Oral Daily  . senna  1 tablet Oral BID  . tamsulosin  0.4 mg Oral Daily  . trimethoprim-polymyxin b  1 drop Left Eye 6 times per day   Continuous: . sodium chloride 1,000 mL (09/21/15 2352)   [id number] **OR** acetaminophen, albuterol, ipratropium-albuterol, menthol-cetylpyridinium **OR** phenol, ondansetron (ZOFRAN) IV, ondansetron, polyethylene glycol  Assessment/Plan:  Principal Problem:   Cervical spondylosis with myelopathy Active Problems:   Hyperlipidemia   OSA (obstructive sleep apnea)   HTN (hypertension), malignant   AKI (acute kidney injury) ([place])   Morbid obesity (HCC)   Asthma   Left ventricular diastolic dysfunction, NYHA class 1   Uncontrolled type 2 diabetes mellitus with complication (H[facility])   Chronic diastolic CHF (congestive heart failure), NYHA class 1 (HCC)   PTSD (post-traumatic stress disorder)   Bipolar I disorder ([facility])   Anxiety state    Cervical spondylosis with myelopathy Status post laminectomy. Management per neurosurgery.She continues to complain of neck pain and lower back pain. Neck pain radiates to the chest. These will be deferred to the neurosurgeon.  Diabetes mellitus type 2 Blood sugars are reasonably well controlled. Her diabetic regimen have to be modified due to low blood sugars. She is currently on NPH and linagliptin. Metformin is on hold  due to renal failure. Continue current dosage. HbA1c was 11.1 in March. Would recommend continuing current regimen, which she is discharged.  Acute kidney injury  Likely secondary to Acute urinary retention. Foley catheter was placed with good urine output. Watch for postobstructive diuresis. Renal function is improving. Foley catheter was discontinued this morning. Continue IV fluids at lower rate. Avoid nephrotoxic medications. We'll recommend checking basic metabolic panel next week.  Obstructive sleep apnea. Unable to tolerate CPap due to c-collar, not consistently compliant at home as well.  Chronic Diastolic dysfunction. She was well compensated. Continue to monitor clinically.  Mood disorder/bipolar disorder/PTSD Stable. Continuing home medications. Will  need continued outpatient follow-up.  Essential hypertension. Blood pressure noted to go up to hypertensive range. She can resume her atenolol at the time of discharge. Would recommend continuing or ARB as well as amlodipine. Depending on her blood pressure trends and heart rate. These can be slowly reinstituted.  DVT Prophylaxis: SCD's     Plan is for placement to a skilled nursing facility for rehabilitation when ready for discharge.   LOS: 6 days   [place] [facility] Pager [phone] 09/22/2015, 1:09 PM  If 7PM-7AM, please contact night-coverage at [email or web address], password [facility]

## 2015-09-22 NOTE — Progress Notes (Signed)
Physical Therapy Treatment Patient Details Name: Diana Henderson MRN: [id number] DOB: November 29, 1970 Today's Date: 09/22/2015    History of Present Illness Pt is a 45 y.o. female s/p Cervical four - seven laminectomies with lateral mass fusion. She underwent ACDF approx 9 months ago. PMH consists of DM, HTN, PTSD, asthma, bipolar d/o, anxiety and depression, hyperlipidemia, OA, GERD, obesity.    PT Comments    Pt able to ambulate today 8', sitting rest break and then 20' with recliner follow due to pt needing to sit quickly.  Cues for posture and needs encouragement. Con't to recommend SNF at this time.  Follow Up Recommendations  SNF     Equipment Recommendations  None recommended by PT    Recommendations for Other Services       Precautions / Restrictions Precautions Precautions: Fall;Cervical Required Braces or Orthoses: Cervical Brace Cervical Brace: Soft collar;At all times Restrictions Weight Bearing Restrictions: No    Mobility  Bed Mobility Overal bed mobility: Needs Assistance Bed Mobility: Rolling;Sidelying to Sit Rolling: Min assist Sidelying to sit: Mod assist;+2 for physical assistance;HOB elevated       General bed mobility comments: cues for technique  Transfers Overall transfer level: Needs assistance Equipment used: Rolling walker (2 wheeled) Transfers: Sit to/from [facility] Sit to Stand: Min assist;+2 safety/equipment Stand pivot transfers: Min assist;+2 safety/equipment       General transfer comment: Pt needed to use BSC due to diarrhea. Then transferred back to EOB prior to gait.  Ambulation/Gait Ambulation/Gait assistance: Min assist;+2 physical assistance;+2 safety/equipment Ambulation Distance (Feet): 8 Feet (plus 20) Assistive device: Rolling walker (2 wheeled) Gait Pattern/deviations: Decreased step length - right;Decreased step length - left;Trunk flexed;Wide base of support Gait velocity: decreased Gait velocity  interpretation: Below normal speed for age/gender General Gait Details: Pt ambulated 8' with MIN of 2 plus daughter following behind with recliner.  Pt sat and rested then 20' more with encouragement.  Pt tends to flex forward and ambulates with slow gait pattern   Stairs            Wheelchair Mobility    Modified Rankin (Stroke Patients Only)       Balance Overall balance assessment: Needs assistance Sitting-balance support: Feet supported;Bilateral upper extremity supported Sitting balance-Leahy Scale: Poor Sitting balance - Comments: Pt requires UE support and tends to lean sideways and not hold herself upright, pain vs. fatigue?   Standing balance support: Bilateral upper extremity supported Standing balance-Leahy Scale: Poor Standing balance comment: requires RW                    Cognition Arousal/Alertness: Awake/alert Behavior During Therapy: Flat affect Overall Cognitive Status: Impaired/Different from baseline               Problem Solving: Slow processing;Requires verbal cues;Requires tactile cues      Exercises      General Comments General comments (skin integrity, edema, etc.): Needed cues to hold self upright.  Needs max encouragement      Pertinent Vitals/Pain Pain Assessment: Faces Faces Pain Scale: Hurts even more Pain Location: upper back and low back Pain Descriptors / Indicators: Spasm Pain Intervention(s): Limited activity within patient's tolerance;Monitored during session;Repositioned    Home Living                      Prior Function            PT Goals (current goals can now be found in the care  plan section) Acute Rehab PT Goals Patient Stated Goal: get back in bed PT Goal Formulation: With patient/family Time For Goal Achievement: 10/02/15 Potential to Achieve Goals: Good Progress towards PT goals: Progressing toward goals    Frequency  Min 5X/week    PT Plan Current plan remains appropriate     Co-evaluation PT/OT/SLP Co-Evaluation/Treatment: Yes   PT goals addressed during session: Mobility/safety with mobility;Proper use of DME       End of Session Equipment Utilized During Treatment: Gait belt;Cervical collar Activity Tolerance: Patient limited by fatigue Patient left: in chair;with call bell/phone within reach;with chair alarm set;with family/visitor present     Time: [id number] PT Time Calculation (min) (ACUTE ONLY): 33 min  Charges:  $Gait Training: 8-22 mins                    G Codes:      Diana Henderson 09/22/2015, 12:02 PM

## 2015-09-23 DIAGNOSIS — Z4789 Encounter for other orthopedic aftercare: Secondary | ICD-10-CM | POA: Diagnosis not present

## 2015-09-23 DIAGNOSIS — N179 Acute kidney failure, unspecified: Secondary | ICD-10-CM | POA: Diagnosis not present

## 2015-09-23 DIAGNOSIS — F061 Catatonic disorder due to known physiological condition: Secondary | ICD-10-CM | POA: Diagnosis not present

## 2015-09-23 DIAGNOSIS — J45909 Unspecified asthma, uncomplicated: Secondary | ICD-10-CM | POA: Diagnosis not present

## 2015-09-23 DIAGNOSIS — M5000 Cervical disc disorder with myelopathy, unspecified cervical region: Secondary | ICD-10-CM | POA: Diagnosis not present

## 2015-09-23 DIAGNOSIS — G609 Hereditary and idiopathic neuropathy, unspecified: Secondary | ICD-10-CM | POA: Diagnosis not present

## 2015-09-23 DIAGNOSIS — F319 Bipolar disorder, unspecified: Secondary | ICD-10-CM | POA: Diagnosis not present

## 2015-09-23 DIAGNOSIS — E118 Type 2 diabetes mellitus with unspecified complications: Secondary | ICD-10-CM | POA: Diagnosis not present

## 2015-09-23 DIAGNOSIS — E119 Type 2 diabetes mellitus without complications: Secondary | ICD-10-CM | POA: Diagnosis not present

## 2015-09-23 DIAGNOSIS — F4310 Post-traumatic stress disorder, unspecified: Secondary | ICD-10-CM | POA: Diagnosis not present

## 2015-09-23 DIAGNOSIS — I5032 Chronic diastolic (congestive) heart failure: Secondary | ICD-10-CM | POA: Diagnosis not present

## 2015-09-23 DIAGNOSIS — E1165 Type 2 diabetes mellitus with hyperglycemia: Secondary | ICD-10-CM | POA: Diagnosis not present

## 2015-09-23 DIAGNOSIS — I10 Essential (primary) hypertension: Secondary | ICD-10-CM | POA: Diagnosis not present

## 2015-09-23 DIAGNOSIS — G4733 Obstructive sleep apnea (adult) (pediatric): Secondary | ICD-10-CM | POA: Diagnosis not present

## 2015-09-23 DIAGNOSIS — F339 Major depressive disorder, recurrent, unspecified: Secondary | ICD-10-CM | POA: Diagnosis not present

## 2015-09-23 DIAGNOSIS — M6281 Muscle weakness (generalized): Secondary | ICD-10-CM | POA: Diagnosis not present

## 2015-09-23 DIAGNOSIS — M4712 Other spondylosis with myelopathy, cervical region: Secondary | ICD-10-CM | POA: Diagnosis not present

## 2015-09-23 DIAGNOSIS — E785 Hyperlipidemia, unspecified: Secondary | ICD-10-CM | POA: Diagnosis not present

## 2015-09-23 LAB — RENAL FUNCTION PANEL
ALBUMIN: 2.5 g/dL — AB (ref 3.5–5.0)
ANION GAP: 12 (ref 5–15)
BUN: 9 mg/dL (ref 6–20)
CALCIUM: 8.8 mg/dL — AB (ref 8.9–10.3)
CO2: 27 mmol/L (ref 22–32)
CREATININE: 1.04 mg/dL — AB (ref 0.44–1.00)
Chloride: 102 mmol/L (ref 101–111)
GFR calc Af Amer: >60 mL/min (ref >60)
GFR calc non Af Amer: >60 mL/min (ref >60)
GLUCOSE: 123 mg/dL — AB (ref 65–99)
PHOSPHORUS: 2.4 mg/dL — AB (ref 2.5–4.6)
Potassium: 3.2 mmol/L — ABNORMAL LOW (ref 3.5–5.1)
SODIUM: 141 mmol/L (ref 135–145)

## 2015-09-23 LAB — GLUCOSE, CAPILLARY
GLUCOSE-CAPILLARY: 129 mg/dL — AB (ref 65–99)
Glucose-Capillary: 101 mg/dL — ABNORMAL HIGH (ref 65–99)

## 2015-09-23 MED ORDER — POTASSIUM CHLORIDE CRYS ER 20 MEQ PO TBCR
40.0000 meq | EXTENDED_RELEASE_TABLET | Freq: Once | ORAL | Status: AC
  Administered 2015-09-23: 40 meq via ORAL
  Filled 2015-09-23: qty 2

## 2015-09-23 MED ORDER — GABAPENTIN 600 MG PO TABS: 600.0000 mg | ORAL_TABLET | Freq: Every day | ORAL | Status: DC

## 2015-09-23 MED ORDER — HYDROCODONE-ACETAMINOPHEN 5-325 MG PO TABS
1.0000 | ORAL_TABLET | Freq: Once | ORAL | Status: AC
Start: 2015-09-23 — End: 2015-09-23
  Administered 2015-09-23: 1 via ORAL
  Filled 2015-09-23: qty 1

## 2015-09-23 MED ORDER — HYDROXYZINE HCL 10 MG PO TABS
10.0000 mg | ORAL_TABLET | Freq: Three times a day (TID) | ORAL | Status: DC | PRN
Start: 2015-09-23 — End: 2015-11-06

## 2015-09-23 MED ORDER — CYCLOBENZAPRINE HCL 10 MG PO TABS
10.0000 mg | ORAL_TABLET | Freq: Once | ORAL | Status: AC
  Administered 2015-09-23: 10 mg via ORAL
  Filled 2015-09-23: qty 1

## 2015-09-23 MED ORDER — TAMSULOSIN HCL 0.4 MG PO CAPS: 0.4000 mg | ORAL_CAPSULE | Freq: Every day | ORAL | Status: DC

## 2015-09-23 NOTE — Progress Notes (Signed)
Patient has accepted bed offer at [facility]. Facility has been informed of patient and family's selection. Per facility representative Izola Price, patient can arrive to facility at 12pm. CSW informed patient and family of transfer time. Patient and family appreciative of CSW services.  Patient to be transported via [facility] to [facility] at 12pm.. D/C Summary sent via Hub system. No further needs were requested at this time. CSW to sign off.   Please re-consult if further CSW needs arise.   Lucius Conn, [place] Worker [facility] Ph: [phone]

## 2015-09-23 NOTE — Progress Notes (Signed)
PT Cancellation Note  Patient Details Name: Diana Henderson MRN: [id number] DOB: 03/17/1971   Cancelled Treatment:    Reason Eval/Treat Not Completed: Fatigue/lethargy limiting ability to participate; patient assisted to reposition due to feeling like choking, wished to hold PT today due to pending transfer to SNF.   Reginia Naas 09/23/2015, 12:00 PM  Magda Kiel, [place] 09/23/2015

## 2015-09-23 NOTE — Discharge Summary (Signed)
Physician Discharge Summary  Patient ID: Diana Henderson MRN: [id number] DOB/AGE: [birth year] 47 y.o.  Admit date: 09/16/2015 Discharge date: 09/23/2015  Admission Diagnoses: Cervical spondylosis with myelopathy  Discharge Diagnoses: Same Principal Problem:   Cervical spondylosis with myelopathy Active Problems:   Hyperlipidemia   OSA (obstructive sleep apnea)   HTN (hypertension), malignant   AKI (acute kidney injury) ([place])   Morbid obesity (HCC)   Asthma   Left ventricular diastolic dysfunction, NYHA class 1   Uncontrolled type 2 diabetes mellitus with complication (HCC)   Chronic diastolic CHF (congestive heart failure), NYHA class 1 (HCC)   PTSD (post-traumatic stress disorder)   Bipolar I disorder (HCC)   Anxiety state   Discharged Condition: Stable  Hospital Course:  Diana Henderson is a 45 y.o. female admitted after elective cervical laminectomy and lateral mass fusion. She was at neurologic baseline postop. Her hospital course was somewhat prolonged by issues with likely medication induced encephalopthy, acute kidney injury, hyperglycemia, and urinary retention. Her encephalopathy resolved after discontinuation of several medications, her AKI improved with hydration, hyperglycemia appeared well controlled, and urinary retention resolved. She was seen by PT/OT and SNF placement was recommended.  Treatments: Surgery - C4-7 laminectomy, lateral mass fusion.  Discharge Exam: Blood pressure 153/97, pulse 94, temperature 98.3 F (36.8 C), temperature source Oral, resp. rate 20, height 5\' 6"  (1.676 m), weight 138.432 kg (305 lb 3 oz), SpO2 99 %. Awake, alert, oriented Speech fluent, appropriate CN grossly intact 5/5 BUE/BLE x mild LUE weakness Wound c/d/i  Disposition: SNF  Discharge Instructions    Discharge instructions    Complete by:  As directed   Please monitor BP closely and resume Amlodipine if BP remains in hypertensive range. Please note that  Risperdal has been stopped due to sedation. Can consider resuming depending on mental status and behavior. Please check CBC and BMET in 1 week. Please monitor CBG's per protocol.   You were cared for by a hospitalist during your hospital stay. If you have any questions about your discharge medications or the care you received while you were in the hospital after you are discharged, you can call the unit and asked to speak with the hospitalist on call if the hospitalist that took care of you is not available. Once you are discharged, your primary care physician will handle any further medical issues. Please note that NO REFILLS for any discharge medications will be authorized once you are discharged, as it is imperative that you return to your primary care physician (or establish a relationship with a primary care physician if you do not have one) for your aftercare needs so that they can reassess your need for medications and monitor your lab values. If you do not have a primary care physician, you can call [phone] for a physician referral.            Medication List    STOP taking these medications        amLODipine 10 MG tablet  Commonly known as:  NORVASC     beclomethasone 80 MCG/ACT inhaler  Commonly known as:  QVAR     FARXIGA 10 MG Tabs tablet  Generic drug:  dapagliflozin propanediol     INVOKANA 300 MG Tabs tablet  Generic drug:  canagliflozin     JANUMET XR 50-1000 MG Tb24  Generic drug:  SitaGLIPtin-MetFORMIN HCl     mometasone 50 MCG/ACT nasal spray  Commonly known as:  NASONEX     olmesartan 20 MG tablet  Commonly known as:  BENICAR     oxyCODONE-acetaminophen 10-325 MG tablet  Commonly known as:  PERCOCET     risperiDONE 0.5 MG tablet  Commonly known as:  RISPERDAL      TAKE these medications        acetaminophen 325 MG tablet  Commonly known as:  TYLENOL  Take 2 tablets (650 mg total) by mouth every 4 (four) hours as needed (temp > 100.5).     albuterol  108 (90 Base) MCG/ACT inhaler  Commonly known as:  PROVENTIL HFA;VENTOLIN HFA  Inhale 1-2 puffs into the lungs every 6 (six) hours as needed for wheezing or shortness of breath.     atenolol 50 MG tablet  Commonly known as:  TENORMIN  Take 1 tablet (50 mg total) by mouth daily.     budesonide-formoterol 160-4.5 MCG/ACT inhaler  Commonly known as:  SYMBICORT  Inhale 2 puffs into the lungs 2 (two) times daily.     DULoxetine 60 MG capsule  Commonly known as:  CYMBALTA  Take 60 mg by mouth at bedtime.     fluticasone 50 MCG/ACT nasal spray  Commonly known as:  FLONASE  Place 1 spray into both nostrils 2 (two) times daily.     gabapentin 600 MG tablet  Commonly known as:  NEURONTIN  Take 1 tablet (600 mg total) by mouth daily.     hydrOXYzine 10 MG tablet  Commonly known as:  ATARAX/VISTARIL  Take 1 tablet (10 mg total) by mouth 3 (three) times daily as needed for anxiety.     insulin NPH-regular Human (70-30) 100 UNIT/ML injection  Commonly known as:  NOVOLIN 70/30  Inject 30 Units into the skin 2 (two) times daily with a meal.     lactulose 10 GM/15ML solution  Commonly known as:  CHRONULAC  Take 45 mLs (30 g total) by mouth 2 (two) times daily.     linagliptin 5 MG Tabs tablet  Commonly known as:  TRADJENTA  Take 1 tablet (5 mg total) by mouth daily with breakfast.     METAMUCIL PO  Take 1 scoop by mouth daily as needed (for constipation).     multivitamin tablet  Take 1 tablet by mouth daily. Reported on 09/12/2015     nabumetone 500 MG tablet  Commonly known as:  RELAFEN  Take 1 tablet (500 mg total) by mouth 2 (two) times daily.     omeprazole 20 MG capsule  Commonly known as:  PRILOSEC  Take 20 mg by mouth daily.     ondansetron 8 MG disintegrating tablet  Commonly known as:  ZOFRAN ODT  Take 1 tablet (8 mg total) by mouth every 8 (eight) hours as needed for nausea or vomiting.     polyethylene glycol packet  Commonly known as:  MIRALAX / GLYCOLAX  Take  17 g by mouth daily as needed for mild constipation.     tamsulosin 0.4 MG Caps capsule  Commonly known as:  FLOMAX  Take 1 capsule (0.4 mg total) by mouth daily.     tiZANidine 4 MG tablet  Commonly known as:  ZANAFLEX  Take 1 tablet (4 mg total) by mouth every 8 (eight) hours as needed for muscle spasms.     trimethoprim-polymyxin b ophthalmic solution  Commonly known as:  POLYTRIM  Place 1 drop into the left eye every 4 (four) hours.           Follow-up Information    Follow up with OSEI-BONSU,GEORGE, MD. Schedule  an appointment as soon as possible for a visit in 2 weeks.   Specialty:  Internal Medicine   Contact information:   [id number] [place] [address] [phone]       Follow up with [facility], Amber Guthridge, C, MD In 3 weeks.   Specialty:  Neurosurgery   Contact information:   [address] [place] [address] [place] [address] [phone]       Signed: Consuella Lose, Loletha Grayer 09/23/2015, 9:16 AM

## 2015-09-23 NOTE — Clinical Social Work Placement (Signed)
   CLINICAL SOCIAL WORK PLACEMENT  NOTE  Date:  09/23/2015  Patient Details  Name: Diana Henderson MRN: [id number] Date of Birth: 06-24-1970  Clinical Social Work is seeking post-discharge placement for this patient at the [place] level of care (*CSW will initial, date and re-position this form in  chart as items are completed):  Yes   Patient/family provided with [place] Work Department's list of facilities offering this level of care within the geographic area requested by the patient (or if unable, by the patient's family).  Yes   Patient/family informed of their freedom to choose among providers that offer the needed level of care, that participate in Medicare, Medicaid or managed care program needed by the patient, have an available bed and are willing to accept the patient.  Yes   Patient/family informed of [facility]'s ownership interest in [facility] and [facility], as well as of the fact that they are under no obligation to receive care at these facilities.  PASRR submitted to EDS on 09/22/15     PASRR number received on 09/22/15     Existing PASRR number confirmed on       FL2 transmitted to all facilities in geographic area requested by pt/family on 09/22/15     FL2 transmitted to all facilities within larger geographic area on       Patient informed that his/her managed care company has contracts with or will negotiate with certain facilities, including the following:        Yes   Patient/family informed of bed offers received.  Patient chooses bed at  [facility])     Physician recommends and patient chooses bed at      Patient to be transferred to  [facility]) on 09/23/15.  Patient to be transferred to facility by  [facility])     Patient family notified on 09/23/15 of transfer.  Name of family member notified:  Patient and daughter [name] aware of transport time     PHYSICIAN       Additional Comment:     _______________________________________________ Raymondo Band, LCSW 09/23/2015, 10:18 AM

## 2015-09-23 NOTE — Progress Notes (Signed)
[facility] PROGRESS NOTE  Diana Henderson [id number] DOB: Apr 17, 1971 DOA: 09/16/2015  PCP: Benito Mccreedy, MD  Brief HPI: 45 year old African-American female with past medical history of diabetes on insulin, bipolar disorder, PTSD, hypertension, diastolic dysfunction, obesity, apnea, with noncompliance, underwent elective cervical laminectomy. Medicine was consulted for management of diabetes and renal failure.  Past medical history:  Past Medical History  Diagnosis Date  . Diabetes mellitus   . Hypertension   . Hyperlipidemia   . Depression   . PTSD (post-traumatic stress disorder)   . Pain in joint, ankle and foot 02/17/2013  . Anxiety   . Bipolar 1 disorder ([place])   . Sleep apnea     uses CPAP intermittently, pt. doesn't remember where she had the study  . Asthma     has been eval. by Dr. Gwenette Greet- last 06/2014  . Pneumonia     hosp. for a couple of day- not sure when it was   . GERD (gastroesophageal reflux disease)   . Headache     sometimes has migraines   . Arthritis     osteoarthritis   . OSA on CPAP   . Knee pain, chronic   . Shortness of breath dyspnea   . Complication of anesthesia     09/13/14: malignant HTN with inducction, case cx but no sourse identified; 11/05/14 remifentanyl infusion used to attenuate hypergynamic induction     Antibiotics: None  Subjective: Patient Feels well this morning. Pain in the neck area is improving. Her daughter is at the bedside.  Objective: Vital Signs  Filed Vitals:   09/22/15 2138 09/23/15 0155 09/23/15 0549 09/23/15 0847  BP: 151/60 101/76 153/97   Pulse: 92 89 94   Temp: 99 F (37.2 C) 97.2 F (36.2 C) 98.3 F (36.8 C)   TempSrc: Oral Oral Oral   Resp: 20 20 20    Height:      Weight:      SpO2: 92% 99% 100% 99%    Intake/Output Summary (Last 24 hours) at 09/23/15 [id number] Last data filed at 09/23/15 0841  Gross per 24 hour  Intake   1440 ml  Output      0 ml  Net   1440 ml   Filed Weights   09/16/15 0627  Weight: 138.432 kg (305 lb 3 oz)    General appearance: alert, cooperative, appears stated age and no distress Resp: Diminished air entry at the bases without any crackles, wheezing or rhonchi elsewhere. Cardio: regular rate and rhythm, S1, S2 normal, no murmur, click, rub or gallop GI: soft, non-tender; bowel sounds normal; no masses,  no organomegaly Neurologic: Awake and alert. Oriented 3. Somewhat distracted. No focal deficits. Able to move both her lower extremities.  Lab Results:  Basic Metabolic Panel:  Recent Labs Lab 09/19/15 0628 09/20/15 0803 09/21/15 0603 09/22/15 0902 09/23/15 0440  NA 133* 137 139 141 141  K 4.4 3.8 3.7 3.9 3.2*  CL 97* 105 103 102 102  CO2 25 22 26 26 27   GLUCOSE 147* 135* 135* 160* 123*  BUN 29* 29* 25* 15 9  CREATININE 3.18* 2.58* 1.77* 1.09* 1.04*  CALCIUM 8.1* 7.6* 8.3* 8.8* 8.8*  PHOS  --  4.6 2.4* 2.1* 2.4*   Liver Function Tests:  Recent Labs Lab 09/20/15 0803 09/21/15 0603 09/22/15 0902 09/23/15 0440  ALBUMIN 2.3* 2.3* 2.7* 2.5*   CBC:  Recent Labs Lab 09/19/15 0628  WBC 10.5  HGB 10.6*  HCT 34.4*  MCV 85.6  PLT 143*  CBG:  Recent Labs Lab 09/22/15 0655 09/22/15 1133 09/22/15 1709 09/22/15 2128 09/23/15 0635  GLUCAP 160* 152* 160* 107* 129*    Recent Results (from the past 240 hour(s))  Surgical pcr screen     Status: None   Collection Time: 09/13/15  1:47 PM  Result Value Ref Range Status   MRSA, PCR NEGATIVE NEGATIVE Final   Staphylococcus aureus NEGATIVE NEGATIVE Final    Comment:        The Xpert SA Assay (FDA approved for NASAL specimens in patients over 12 years of age), is one component of a comprehensive surveillance program.  Test performance has been validated by [facility] for patients greater than or equal to 24 year old. It is not intended to diagnose infection nor to guide or monitor treatment.       Studies/Results: No results found.  Medications:    Scheduled: . budesonide (PULMICORT) nebulizer solution  0.25 mg Nebulization BID  . docusate sodium  100 mg Oral BID  . fluticasone  1 spray Each Nare BID  . gabapentin  600 mg Oral Daily  . insulin aspart  0-15 Units Subcutaneous TID WC  . insulin aspart  0-5 Units Subcutaneous QHS  . insulin aspart protamine- aspart  30 Units Subcutaneous BID WC  . lactulose  30 g Oral BID  . linagliptin  5 mg Oral Q breakfast  . mometasone-formoterol  2 puff Inhalation BID  . multivitamin with minerals  1 tablet Oral Daily  . pantoprazole  40 mg Oral Daily  . potassium chloride  40 mEq Oral Once  . psyllium  1 packet Oral Daily  . senna  1 tablet Oral BID  . tamsulosin  0.4 mg Oral Daily  . trimethoprim-polymyxin b  1 drop Left Eye 6 times per day   Continuous: . sodium chloride 50 mL/hr at 09/22/15 1354   [id number] **OR** acetaminophen, albuterol, ipratropium-albuterol, menthol-cetylpyridinium **OR** phenol, ondansetron (ZOFRAN) IV, ondansetron, polyethylene glycol  Assessment/Plan:  Principal Problem:   Cervical spondylosis with myelopathy Active Problems:   Hyperlipidemia   OSA (obstructive sleep apnea)   HTN (hypertension), malignant   AKI (acute kidney injury) ([place])   Morbid obesity (H[facility])   Asthma   Left ventricular diastolic dysfunction, NYHA class 1   Uncontrolled type 2 diabetes mellitus with complication (H[facility])   Chronic diastolic CHF (congestive heart failure), NYHA class 1 (H[facility])   PTSD (post-traumatic stress disorder)   Bipolar I disorder ([facility])   Anxiety state    Cervical spondylosis with myelopathy Status post laminectomy. Management per neurosurgery.   Diabetes mellitus type 2 Blood sugars are reasonably well controlled. Her diabetic regimen have to be modified due to low blood sugars. She is currently on NPH and linagliptin. She has good control with this regimen. Her home medication list was reviewed. We will discontinue Janumet, Invokana and [place]. HbA1c  was 11.1 in March. Would recommend continuing current regimen, which she is discharged. CBGs to be monitored closely at SNF.  Acute kidney injury  Likely secondary to Acute urinary retention. Foley catheter has been removed. She is able to void without any difficulty. Renal function has improved. Would recommend repeating basic metabolic panel in 1 week at SNF. Okay to stop IV fluids.   Obstructive sleep apnea. Unable to tolerate CPap due to c-collar, not consistently compliant at home as well.  Chronic Diastolic dysfunction. She was well compensated. Continue to monitor clinically.  Mood disorder/bipolar disorder/PTSD Stable. Continuing home medications. Will need continued outpatient follow-up. Due  to excessive somnolence and renal failure dose of gabapentin has been reduced. Risperdal has been discontinued for now. Risperdal can be reinitiated depending on her mental status and behavior.  Essential hypertension. Blood pressure noted to go up to hypertensive range. She can resume her atenolol at the time of discharge. Would recommend discontinuing  ARB as well as amlodipine. If blood pressure remains in hypertensive range. Would first resume amlodipine. Would resume ARB only if renal function remains normal.  DVT Prophylaxis: SCD's     Plan is for placement to a skilled nursing facility for rehabilitation when ready for discharge. Patient remains medically stable. Okay from our perspective for discharge.   LOS: 7 days   [facility] Pager [phone] 09/23/2015, 9:22 AM  If 7PM-7AM, please contact night-coverage at [email or web address], password [facility]

## 2015-09-23 NOTE — Progress Notes (Signed)
Pt transferred to nursing home via ambulance. Pt given all discharge instructions and also pt's family given a copy of discharge instructions. Pt verbalized understanding.

## 2015-09-26 ENCOUNTER — Non-Acute Institutional Stay (SKILLED_NURSING_FACILITY): Payer: Medicare Other | Admitting: Internal Medicine

## 2015-09-26 ENCOUNTER — Encounter: Payer: Self-pay | Admitting: Internal Medicine

## 2015-09-26 DIAGNOSIS — E785 Hyperlipidemia, unspecified: Secondary | ICD-10-CM

## 2015-09-26 DIAGNOSIS — F319 Bipolar disorder, unspecified: Secondary | ICD-10-CM

## 2015-09-26 DIAGNOSIS — F4310 Post-traumatic stress disorder, unspecified: Secondary | ICD-10-CM

## 2015-09-26 DIAGNOSIS — M542 Cervicalgia: Secondary | ICD-10-CM

## 2015-09-26 DIAGNOSIS — J45909 Unspecified asthma, uncomplicated: Secondary | ICD-10-CM

## 2015-09-26 DIAGNOSIS — I5032 Chronic diastolic (congestive) heart failure: Secondary | ICD-10-CM

## 2015-09-26 DIAGNOSIS — M4712 Other spondylosis with myelopathy, cervical region: Secondary | ICD-10-CM | POA: Diagnosis not present

## 2015-09-26 DIAGNOSIS — Z794 Long term (current) use of insulin: Secondary | ICD-10-CM

## 2015-09-26 DIAGNOSIS — E118 Type 2 diabetes mellitus with unspecified complications: Secondary | ICD-10-CM

## 2015-09-26 DIAGNOSIS — E1165 Type 2 diabetes mellitus with hyperglycemia: Secondary | ICD-10-CM

## 2015-09-26 DIAGNOSIS — I10 Essential (primary) hypertension: Secondary | ICD-10-CM

## 2015-09-26 DIAGNOSIS — IMO0002 Reserved for concepts with insufficient information to code with codable children: Secondary | ICD-10-CM

## 2015-09-26 DIAGNOSIS — G4733 Obstructive sleep apnea (adult) (pediatric): Secondary | ICD-10-CM | POA: Diagnosis not present

## 2015-09-26 NOTE — Progress Notes (Signed)
Patient ID: Diana Henderson, female   DOB: 14-Jul-1970, 45 y.o.   MRN: [id number]    HISTORY AND PHYSICAL   DATE: 09/26/15  Location:  [facility]  Nursing Home Room Number: 947 Place of Service: SNF (31)   Extended Emergency Contact Information Primary Emergency Contact: Rosalio Macadamia [place] of [place] Phone: [phone] Relation: Daughter Secondary Emergency Contact: Laqueta Carina [place] of [place] Phone: [phone] Relation: Friend  Advanced Directive information Does patient have an advance directive?: No, Would patient like information on creating an advanced directive?: No - patient declined information  Chief Complaint  Patient presents with  . New Admit To SNF    HPI:  45 yo female seen today as a new admission into SNF following hospital stay for cervical  spondylosis with myelopathy, AKI, HTN, hyperlipidemia, OSA, morbid obesity, asthma, DM, LV diastolic dysfunction/chronic HF NYHA class 1, PTSD, bipolar 1 d/o and anxiety. She underwent an elective cervical laminectomy (C4-7) and lateral mass fusion. Hospital course prolonged due to likely medication induced encephalopthy, acute kidney injury, hyperglycemia, and urinary retention. meds adjusted. K 3.2 at d/c; albumin 2.5; Cr 1.04 (peak 3.18); Phos 2.4; H/H 10.6/34.4. She was d/c to SNF for short term rehab  Today she reports difficulty eating due to muscle spasms in neck/UE. Pain is uncontrolled but hse has not asked for pain med. She has a muscle relaxer ordered. No nursing issues. She snores and uses CPAP at home. (+) fatigue. No falls. No f/c. She is a poor historian due to psych issues. Hx obtained from chart  DM - uncontrolled with A1c 11.7%. Takes tradjenta, novolin 70/30. No low BS reactions.   PTSD/bipolar d/o - stable on cymbalta  Chronic constipation/GERD - stable on omeprazole, lactulose, prn zofran, miralax  Chronic pain/cervical myelopathy - takes  gabapentin, relafen, zanaflex  HTN - Bp stable on atenolol  Asthma - stable on symbicort and prn HFA. Uses flonase for allergic rhinitis  Urinary urge incontinence - stable on flomax   She takes vitamins and supplements  Past Medical History  Diagnosis Date  . Diabetes mellitus   . Hypertension   . Hyperlipidemia   . Depression   . PTSD (post-traumatic stress disorder)   . Pain in joint, ankle and foot 02/17/2013  . Anxiety   . Bipolar 1 disorder ([place])   . Sleep apnea     uses CPAP intermittently, pt. doesn't remember where she had the study  . Asthma     has been eval. by Dr. Gwenette Greet- last 06/2014  . Pneumonia     hosp. for a couple of day- not sure when it was   . GERD (gastroesophageal reflux disease)   . Headache     sometimes has migraines   . Arthritis     osteoarthritis   . OSA on CPAP   . Knee pain, chronic   . Shortness of breath dyspnea   . Complication of anesthesia     09/13/14: malignant HTN with inducction, case cx but no sourse identified; 11/05/14 remifentanyl infusion used to attenuate hypergynamic induction    Past Surgical History  Procedure Laterality Date  . Tubal ligation    . Midtarsal arthrodesis Right 07/17/12    Rt foot  . Remove implant deep Right 07/17/12    Rt foot  . Lapidus fusion Right 01/10/12    Rt foot  . Carpal tunnel release Left 2001    Left wrist  . Gastroc recession extremity Right 3//20/14  Right foot  . Hernia repair      1996  . Remove implant deep Right 07/22/2014    Rt foot @ [facility]  . Cotton osteotomy w/ bone graft Right 09/30/2014    Rt foot @ [facility]  . Anterior cervical decomp/discectomy fusion N/A 11/05/2014    Procedure: ANTERIOR CERVICAL DECOMPRESSION/DISCECTOMY FUSION 1 LEVEL;  Surgeon: Consuella Lose, MD;  Location: [facility];  Service: Neurosurgery;  Laterality: N/A;  Cervical five- cervical seven anterior cervical decompression with fusion interbody prosthesis plating and bonegraft  . Cervical laminectomy   03/31/20176    CERVICAL 4 SEVEN LAMINECTOMIES WITH LATERAL FUSION  . Posterior cervical fusion/foraminotomy N/A 09/16/2015    Procedure: Cervical four - seven laminectomies with lateral mass fusion;  Surgeon: Consuella Lose, MD;  Location: [facility];  Service: Neurosurgery;  Laterality: N/A;  C4-7 laminectomy with lateral mass fusion    Patient Care Team: Benito Mccreedy, MD as PCP - General (Internal Medicine)  Social History   Social History  . Marital Status: Single    Spouse Name: N/A  . Number of Children: 1  . Years of Education: 12   Occupational History  . disabled    Social History Main Topics  . Smoking status: Former Smoker -- 2.00 packs/day for 6 years    Types: Cigarettes    Quit date: 06/18/1978  . Smokeless tobacco: Never Used  . Alcohol Use: 0.0 oz/week    0 Standard drinks or equivalent per week     Comment: weekend- use of beer   . Drug Use: No  . Sexual Activity: No   Other Topics Concern  . Not on file   Social History Narrative   Lives with daughter, works at [employer], does not exercise but is on her feet at work.     reports that she quit smoking about 37 years ago. Her smoking use included Cigarettes. She has a 12 pack-year smoking history. She has never used smokeless tobacco. She reports that she drinks alcohol. She reports that she does not use illicit drugs.  Family History  Problem Relation Age of Onset  . Heart attack Father   . Kidney disease Mother    Family Status  Relation Status Death Age  . Mother Deceased 97s    ESRD on HD  . Father Deceased 67s    s/p CABG  . Sister Alive     No CAD  . Sister Alive     No CAD  . Brother Alive     No CAD    Immunization History  Administered Date(s) Administered  . Influenza,inj,Quad PF,36+ Mos 09/05/2014  . Influenza-Unspecified 03/18/2014  . PPD Test 09/24/2015  . Pneumococcal Polysaccharide-23 01/15/2014, 05/17/2014    Allergies  Allergen Reactions  . Celery Oil Hives    . Pork-Derived Products Nausea And Vomiting and Other (See Comments)    headache  . Latex Itching    Powdered latex gloves when patient wears.      Medications: Patient's Medications  New Prescriptions   No medications on file  Previous Medications   ACETAMINOPHEN (TYLENOL) 325 MG TABLET    Take 2 tablets (650 mg total) by mouth every 4 (four) hours as needed (temp > 100.5).   ALBUTEROL (PROVENTIL HFA;VENTOLIN HFA) 108 (90 BASE) MCG/ACT INHALER    Inhale 1-2 puffs into the lungs every 6 (six) hours as needed for wheezing or shortness of breath.   ATENOLOL (TENORMIN) 50 MG TABLET    Take 1 tablet (50  mg total) by mouth daily.   BUDESONIDE-FORMOTEROL (SYMBICORT) 160-4.5 MCG/ACT INHALER    Inhale 2 puffs into the lungs 2 (two) times daily.   DULOXETINE (CYMBALTA) 60 MG CAPSULE    Take 60 mg by mouth at bedtime.    FLUTICASONE (FLONASE) 50 MCG/ACT NASAL SPRAY    Place 1 spray into both nostrils 2 (two) times daily.   GABAPENTIN (NEURONTIN) 600 MG TABLET    Take 1 tablet (600 mg total) by mouth daily.   HYDROXYZINE (ATARAX/VISTARIL) 10 MG TABLET    Take 1 tablet (10 mg total) by mouth 3 (three) times daily as needed for anxiety.   INSULIN NPH-REGULAR HUMAN (NOVOLIN 70/30) (70-30) 100 UNIT/ML INJECTION    Inject 30 Units into the skin 2 (two) times daily with a meal.   LACTULOSE (CHRONULAC) 10 GM/15ML SOLUTION    Take 45 mLs (30 g total) by mouth 2 (two) times daily.   LINAGLIPTIN (TRADJENTA) 5 MG TABS TABLET    Take 1 tablet (5 mg total) by mouth daily with breakfast.   MULTIPLE VITAMIN (MULTIVITAMIN) TABLET    Take 1 tablet by mouth daily. Reported on 09/12/2015   NABUMETONE (RELAFEN) 500 MG TABLET    Take 1 tablet (500 mg total) by mouth 2 (two) times daily.   OMEPRAZOLE (PRILOSEC) 20 MG CAPSULE    Take 20 mg by mouth daily.    ONDANSETRON (ZOFRAN ODT) 8 MG DISINTEGRATING TABLET    Take 1 tablet (8 mg total) by mouth every 8 (eight) hours as needed for nausea or vomiting.   POLYETHYLENE  GLYCOL (MIRALAX / GLYCOLAX) PACKET    Take 17 g by mouth daily as needed for mild constipation.   PSYLLIUM (METAMUCIL PO)    Take 1 scoop by mouth daily as needed (for constipation).    TAMSULOSIN (FLOMAX) 0.4 MG CAPS CAPSULE    Take 1 capsule (0.4 mg total) by mouth daily.   TIZANIDINE (ZANAFLEX) 4 MG TABLET    Take 1 tablet (4 mg total) by mouth every 8 (eight) hours as needed for muscle spasms.   TRIMETHOPRIM-POLYMYXIN B (POLYTRIM) OPHTHALMIC SOLUTION    Place 1 drop into the left eye every 4 (four) hours.  Modified Medications   No medications on file  Discontinued Medications   No medications on file    Review of Systems  Unable to perform ROS: Psychiatric disorder    Filed Vitals:   09/26/15 1012  BP: 124/74  Pulse: 68  Temp: 97.8 F (36.6 C)  TempSrc: Oral  Resp: 18  Height: 5' 6" (1.676 m)  Weight: 305 lb (138.347 kg)   Body mass index is 49.25 kg/(m^2).  Physical Exam  Constitutional: She appears well-developed and well-nourished.  Asleep/snoring loudly but no apnea witnessed, awakens with gentle shaking. In NAD  HENT:  Mouth/Throat: Oropharynx is clear and moist. No oropharyngeal exudate.  Eyes: Pupils are equal, round, and reactive to light. No scleral icterus.  Neck: Neck supple. Muscular tenderness present. Carotid bruit is not present. Decreased range of motion present. No tracheal deviation present. No thyromegaly present.    Cardiovascular: Normal rate, regular rhythm, normal heart sounds and intact distal pulses.  Exam reveals no gallop and no friction rub.   No murmur heard. No LE edema b/l. no calf TTP.   Pulmonary/Chest: Effort normal and breath sounds normal. No stridor. No respiratory distress. She has no wheezes. She has no rales.  Abdominal: Soft. Bowel sounds are normal. She exhibits no distension and no mass. There  is no hepatomegaly. There is no tenderness. There is no rebound and no guarding.  Morbidly obese  Musculoskeletal: She exhibits edema  and tenderness.  Lymphadenopathy:    She has no cervical adenopathy.  Neurological: She is alert.  Skin: Skin is warm and dry. No rash noted.  Psychiatric: She has a normal mood and affect. Her behavior is normal.     Labs reviewed: Admission on 09/16/2015, Discharged on 09/23/2015  No results displayed because visit has over 200 results.    Admission on 09/14/2015, Discharged on 09/15/2015  Component Date Value Ref Range Status  . Sodium 09/14/2015 132* 135 - 145 mmol/L Final  . Potassium 09/14/2015 4.2  3.5 - 5.1 mmol/L Final  . Chloride 09/14/2015 99* 101 - 111 mmol/L Final  . CO2 09/14/2015 21* 22 - 32 mmol/L Final  . Glucose, Bld 09/14/2015 333* 65 - 99 mg/dL Final  . BUN 09/14/2015 11  6 - 20 mg/dL Final  . Creatinine, Ser 09/14/2015 1.03* 0.44 - 1.00 mg/dL Final  . Calcium 09/14/2015 8.9  8.9 - 10.3 mg/dL Final  . GFR calc non Af Amer 09/14/2015 >60  >60 mL/min Final  . GFR calc Af Amer 09/14/2015 >60  >60 mL/min Final   Comment: (NOTE) The eGFR has been calculated using the CKD EPI equation. This calculation has not been validated in all clinical situations. eGFR's persistently <60 mL/min signify possible Chronic Kidney Disease.   . Anion gap 09/14/2015 12  5 - 15 Final  . WBC 09/14/2015 5.7  4.0 - 10.5 K/uL Final  . RBC 09/14/2015 5.02  3.87 - 5.11 MIL/uL Final  . Hemoglobin 09/14/2015 13.6  12.0 - 15.0 g/dL Final  . HCT 09/14/2015 42.4  36.0 - 46.0 % Final  . MCV 09/14/2015 84.5  78.0 - 100.0 fL Final  . MCH 09/14/2015 27.1  26.0 - 34.0 pg Final  . MCHC 09/14/2015 32.1  30.0 - 36.0 g/dL Final  . RDW 09/14/2015 12.9  11.5 - 15.5 % Final  . Platelets 09/14/2015 177  150 - 400 K/uL Final  . Color, Urine 09/14/2015 YELLOW  YELLOW Final  . APPearance 09/14/2015 CLEAR  CLEAR Final  . Specific Gravity, Urine 09/14/2015 1.017  1.005 - 1.030 Final  . pH 09/14/2015 5.5  5.0 - 8.0 Final  . Glucose, UA 09/14/2015 >1000* NEGATIVE mg/dL Final  . Hgb urine dipstick 09/14/2015  NEGATIVE  NEGATIVE Final  . Bilirubin Urine 09/14/2015 NEGATIVE  NEGATIVE Final  . Ketones, ur 09/14/2015 NEGATIVE  NEGATIVE mg/dL Final  . Protein, ur 09/14/2015 NEGATIVE  NEGATIVE mg/dL Final  . Nitrite 09/14/2015 NEGATIVE  NEGATIVE Final  . Leukocytes, UA 09/14/2015 NEGATIVE  NEGATIVE Final  . Glucose-Capillary 09/14/2015 473* 65 - 99 mg/dL Final  . Squamous Epithelial / LPF 09/14/2015 0-5* NONE SEEN Final  . WBC, UA 09/14/2015 NONE SEEN  0 - 5 WBC/hpf Final  . RBC / HPF 09/14/2015 0-5  0 - 5 RBC/hpf Final  . Bacteria, UA 09/14/2015 NONE SEEN  NONE SEEN Final  . Glucose-Capillary 09/14/2015 334* 65 - 99 mg/dL Final  . Glucose-Capillary 09/14/2015 276* 65 - 99 mg/dL Final  . Glucose-Capillary 09/15/2015 122* 65 - 99 mg/dL Final  Hospital Outpatient Visit on 09/13/2015  Component Date Value Ref Range Status  . Glucose-Capillary 09/13/2015 332* 65 - 99 mg/dL Final  . MRSA, PCR 09/13/2015 NEGATIVE  NEGATIVE Final  . Staphylococcus aureus 09/13/2015 NEGATIVE  NEGATIVE Final   Comment:        The  Xpert SA Assay (FDA approved for NASAL specimens in patients over 68 years of age), is one component of a comprehensive surveillance program.  Test performance has been validated by [facility] for patients greater than or equal to 41 year old. It is not intended to diagnose infection nor to guide or monitor treatment.   . Sodium 09/13/2015 134* 135 - 145 mmol/L Final  . Potassium 09/13/2015 4.8  3.5 - 5.1 mmol/L Final  . Chloride 09/13/2015 101  101 - 111 mmol/L Final  . CO2 09/13/2015 25  22 - 32 mmol/L Final  . Glucose, Bld 09/13/2015 360* 65 - 99 mg/dL Final  . BUN 09/13/2015 9  6 - 20 mg/dL Final  . Creatinine, Ser 09/13/2015 1.07* 0.44 - 1.00 mg/dL Final  . Calcium 09/13/2015 8.9  8.9 - 10.3 mg/dL Final  . GFR calc non Af Amer 09/13/2015 >60  >60 mL/min Final  . GFR calc Af Amer 09/13/2015 >60  >60 mL/min Final   Comment: (NOTE) The eGFR has been calculated using the CKD EPI  equation. This calculation has not been validated in all clinical situations. eGFR's persistently <60 mL/min signify possible Chronic Kidney Disease.   . Anion gap 09/13/2015 8  5 - 15 Final  . Hgb A1c MFr Bld 09/13/2015 11.1* 4.8 - 5.6 % Final   Comment: (NOTE)         Pre-diabetes: 5.7 - 6.4         Diabetes: >6.4         Glycemic control for adults with diabetes: <7.0   . Mean Plasma Glucose 09/13/2015 272   Final   Comment: (NOTE) Performed At: [facility] [place], [place] [id number] Lindon Romp MD [id number]   . WBC 09/13/2015 3.6* 4.0 - 10.5 K/uL Final  . RBC 09/13/2015 4.88  3.87 - 5.11 MIL/uL Final  . Hemoglobin 09/13/2015 13.0  12.0 - 15.0 g/dL Final  . HCT 09/13/2015 40.9  36.0 - 46.0 % Final  . MCV 09/13/2015 83.8  78.0 - 100.0 fL Final  . MCH 09/13/2015 26.6  26.0 - 34.0 pg Final  . MCHC 09/13/2015 31.8  30.0 - 36.0 g/dL Final  . RDW 09/13/2015 13.1  11.5 - 15.5 % Final  . Platelets 09/13/2015 161  150 - 400 K/uL Final  . ABO/RH(D) 09/13/2015 B POS   Final  . Antibody Screen 09/13/2015 NEG   Final  . Sample Expiration 09/13/2015 09/16/2015   Final  . Preg, Serum 09/13/2015 NEGATIVE  NEGATIVE Final   Comment:        THE SENSITIVITY OF THIS METHODOLOGY IS >10 mIU/mL.   . ABO/RH(D) 09/13/2015 B POS   Final  Admission on 08/17/2015, Discharged on 08/17/2015  Component Date Value Ref Range Status  . Sodium 08/17/2015 135  135 - 145 mmol/L Final  . Potassium 08/17/2015 4.1  3.5 - 5.1 mmol/L Final  . Chloride 08/17/2015 99* 101 - 111 mmol/L Final  . CO2 08/17/2015 25  22 - 32 mmol/L Final  . Glucose, Bld 08/17/2015 274* 65 - 99 mg/dL Final  . BUN 08/17/2015 6  6 - 20 mg/dL Final  . Creatinine, Ser 08/17/2015 0.94  0.44 - 1.00 mg/dL Final  . Calcium 08/17/2015 9.5  8.9 - 10.3 mg/dL Final  . GFR calc non Af Amer 08/17/2015 >60  >60 mL/min Final  . GFR calc Af Amer 08/17/2015 >60  >60 mL/min Final   Comment: (NOTE) The eGFR has been  calculated using  the CKD EPI equation. This calculation has not been validated in all clinical situations. eGFR's persistently <60 mL/min signify possible Chronic Kidney Disease.   . Anion gap 08/17/2015 11  5 - 15 Final  . WBC 08/17/2015 6.0  4.0 - 10.5 K/uL Final  . RBC 08/17/2015 5.17* 3.87 - 5.11 MIL/uL Final  . Hemoglobin 08/17/2015 13.8  12.0 - 15.0 g/dL Final  . HCT 08/17/2015 43.3  36.0 - 46.0 % Final  . MCV 08/17/2015 83.8  78.0 - 100.0 fL Final  . MCH 08/17/2015 26.7  26.0 - 34.0 pg Final  . MCHC 08/17/2015 31.9  30.0 - 36.0 g/dL Final  . RDW 08/17/2015 12.6  11.5 - 15.5 % Final  . Platelets 08/17/2015 162  150 - 400 K/uL Final  . Troponin i, poc 08/17/2015 0.00  0.00 - 0.08 ng/mL Final  . Comment 3 08/17/2015          Final   Comment: Due to the release kinetics of cTnI, a negative result within the first hours of the onset of symptoms does not rule out myocardial infarction with certainty. If myocardial infarction is still suspected, repeat the test at appropriate intervals.   . Troponin i, poc 08/17/2015 0.00  0.00 - 0.08 ng/mL Final  . Comment 3 08/17/2015          Final   Comment: Due to the release kinetics of cTnI, a negative result within the first hours of the onset of symptoms does not rule out myocardial infarction with certainty. If myocardial infarction is still suspected, repeat the test at appropriate intervals.     Dg Cervical Spine 1 View  09/16/2015  CLINICAL DATA:  C4-7 posterior fusion. EXAM: CERVICAL SPINE 1 VIEW COMPARISON:  MRI from 08/17/2015. FINDINGS: A single spot fluoro images obtained at 1135 hours. Anterior cervical plate is visualized with bilateral lateral mass screws and fusion rods. IMPRESSION: Intraoperative localization. Electronically Signed   By: Misty Stanley M.D.   On: 09/16/2015 12:05   Dg Foot Complete Left  08/29/2015  Radiographic examination reveal: Right foot has positive of bone callus around fusion site of 1st MCJ,  irregular joint space at Naviculocuneiform joint and ankle joint right foot with elevated first metatarsal bone. Old fusion site at first [place] left appears to be fused with bone callus. Left foot show in lateral view elevated first ray, normal and congruous joint surfaces and spaces at TNJ, and ankle joint. Impressions: Right foot and ankle pain due to osteoarthropathy of midfoot and ankle joint. Left foot pain from faulty biomechanics.  Dg Foot Complete Right  08/29/2015  Radiographic examination reveal: Right foot has positive of bone callus around fusion site of 1st MCJ, irregular joint space at Naviculocuneiform joint and ankle joint right foot with elevated first metatarsal bone. Old fusion site at first [place] left appears to be fused with bone callus. Left foot show in lateral view elevated first ray, normal and congruous joint surfaces and spaces at TNJ, and ankle joint. Impressions: Right foot and ankle pain due to osteoarthropathy of midfoot and ankle joint. Left foot pain from faulty biomechanics.  Dg C-arm 1-60 Min  09/16/2015  CLINICAL DATA:  Intraoperative localization. EXAM: DG C-ARM 61-120 MIN COMPARISON:  None. FINDINGS: Frontal view the cervical spine obtained during posterior cervical fusion shows bilateral lateral mass screws with fusion rods. There is an anterior cervical plate in situ. IMPRESSION: Intraoperative localization. Electronically Signed   By: Misty Stanley M.D.   On: 09/16/2015 12:11  Assessment/Plan   ICD-9-CM ICD-10-CM   1. OSA (obstructive sleep apnea) 327.23 G47.33   2. Neck pain 723.1 M54.2   3. Essential hypertension 401.9 I10   4. Cervical spondylosis with myelopathy 721.1 M47.12    s/p C4-7 laminectomy and lateral mass fusion  5. Uncontrolled type 2 diabetes mellitus with complication, with long-term current use of insulin (HCC) 250.82 E11.8    V58.67 E11.65     Z79.4   6. PTSD (post-traumatic stress disorder) 309.81 F43.10   7. Bipolar I disorder (HCC)  296.7 F31.9   8. Asthma, unspecified asthma severity, uncomplicated 357.01 X79.390   9. Hyperlipidemia 272.4 E78.5   10. Chronic diastolic CHF (congestive heart failure), NYHA class 1 (HCC) 428.32 I50.32   11. Morbid obesity due to excess calories (HCC) 278.01 E66.01      Start CPAP qhs with auto titration  Check CBC, BMP in 1 week  Cont other meds as ordered  Maintain tight glycemic control  F/u with neurology as scheduled  PT/OT/ST as ordered  GOAL: short term rehab and d/c home when medically appropriate. Communicated with pt and nursing.  Will follow  Alyna Stensland S. Perlie Gold  [facility] [address] [place], [place] [address] [phone] Cell (Monday-Friday 8 AM - 5 PM) [phone] After 5 PM and follow prompts

## 2015-10-21 ENCOUNTER — Encounter: Payer: Self-pay | Admitting: Adult Health

## 2015-10-21 ENCOUNTER — Non-Acute Institutional Stay (SKILLED_NURSING_FACILITY): Payer: Medicare Other | Admitting: Adult Health

## 2015-10-21 DIAGNOSIS — I10 Essential (primary) hypertension: Secondary | ICD-10-CM

## 2015-10-21 DIAGNOSIS — G609 Hereditary and idiopathic neuropathy, unspecified: Secondary | ICD-10-CM | POA: Diagnosis not present

## 2015-10-21 DIAGNOSIS — F339 Major depressive disorder, recurrent, unspecified: Secondary | ICD-10-CM

## 2015-10-21 DIAGNOSIS — M5000 Cervical disc disorder with myelopathy, unspecified cervical region: Secondary | ICD-10-CM

## 2015-10-21 DIAGNOSIS — F061 Catatonic disorder due to known physiological condition: Secondary | ICD-10-CM | POA: Diagnosis not present

## 2015-10-21 NOTE — Progress Notes (Signed)
Patient ID: Diana Henderson, female   DOB: [birth year], 45 y.o.   MRN: [id number]   Facility: [facility]     CODE STATUS: Full Code  Allergies  Allergen Reactions  . Celery Oil Hives  . Pork-Derived Products Nausea And Vomiting and Other (See Comments)    headache  . Latex Itching    Powdered latex gloves when patient wears.      Chief Complaint  Patient presents with  . Discharge Note    Discharge from Facility    HPI:  She is being discharged to home with home health for nursing/pt/ot. She will not need dme. She will need her prescriptions to be written and will need to follow up with her medical provider.  She had been hospitalized for cervical  spondylosis with myelopathy. She was admitted to this facility for short term rehab. She is ready to be discharged to home.    Past Medical History  Diagnosis Date  . Diabetes mellitus   . Hypertension   . Hyperlipidemia   . Depression   . PTSD (post-traumatic stress disorder)   . Pain in joint, ankle and foot 02/17/2013  . Anxiety   . Bipolar 1 disorder ([place])   . Sleep apnea     uses CPAP intermittently, pt. doesn't remember where she had the study  . Asthma     has been eval. by Dr. Gwenette Greet- last 06/2014  . Pneumonia     hosp. for a couple of day- not sure when it was   . GERD (gastroesophageal reflux disease)   . Headache     sometimes has migraines   . Arthritis     osteoarthritis   . OSA on CPAP   . Knee pain, chronic   . Shortness of breath dyspnea   . Complication of anesthesia     09/13/14: malignant HTN with inducction, case cx but no sourse identified; 11/05/14 remifentanyl infusion used to attenuate hypergynamic induction    Past Surgical History  Procedure Laterality Date  . Tubal ligation    . Midtarsal arthrodesis Right 07/17/12    Rt foot  . Remove implant deep Right 07/17/12    Rt foot  . Lapidus fusion Right 01/10/12    Rt foot  . Carpal tunnel release Left 2001    Left wrist  . Gastroc recession  extremity Right 3//20/14    Right foot  . Hernia repair      1996  . Remove implant deep Right 07/22/2014    Rt foot @ [facility]  . Cotton osteotomy w/ bone graft Right 09/30/2014    Rt foot @ [facility]  . Anterior cervical decomp/discectomy fusion N/A 11/05/2014    Procedure: ANTERIOR CERVICAL DECOMPRESSION/DISCECTOMY FUSION 1 LEVEL;  Surgeon: Consuella Lose, MD;  Location: [facility] [facility];  Service: Neurosurgery;  Laterality: N/A;  Cervical five- cervical seven anterior cervical decompression with fusion interbody prosthesis plating and bonegraft  . Cervical laminectomy  03/31/20176    CERVICAL 4 SEVEN LAMINECTOMIES WITH LATERAL FUSION  . Posterior cervical fusion/foraminotomy N/A 09/16/2015    Procedure: Cervical four - seven laminectomies with lateral mass fusion;  Surgeon: Consuella Lose, MD;  Location: [place] [facility];  Service: Neurosurgery;  Laterality: N/A;  C4-7 laminectomy with lateral mass fusion    Social History   Social History  . Marital Status: Single    Spouse Name: N/A  . Number of Children: 1  . Years of Education: 12   Occupational History  . disabled    Social  History Main Topics  . Smoking status: Former Smoker -- 2.00 packs/day for 6 years    Types: Cigarettes    Quit date: 06/18/1978  . Smokeless tobacco: Never Used  . Alcohol Use: 0.0 oz/week    0 Standard drinks or equivalent per week     Comment: weekend- use of beer   . Drug Use: No  . Sexual Activity: No   Other Topics Concern  . Not on file   Social History Narrative   Lives with daughter, works at [employer], does not exercise but is on her feet at work.   Family History  Problem Relation Age of Onset  . Heart attack Father   . Kidney disease Mother     VITAL SIGNS BP 118/68 mmHg  Pulse 72  Temp(Src) 98 F (36.7 C) (Oral)  Resp 20  Ht 5\' 6"  (1.676 m)  Wt 322 lb 5 oz (146.2 kg)  BMI 52.05 kg/m2  Patient's Medications  New Prescriptions   No medications on file  Previous Medications    ACETAMINOPHEN (TYLENOL) 325 MG TABLET    Take 2 tablets (650 mg total) by mouth every 4 (four) hours as needed (temp > 100.5).   ALBUTEROL (PROVENTIL HFA;VENTOLIN HFA) 108 (90 BASE) MCG/ACT INHALER    Inhale 1-2 puffs into the lungs every 6 (six) hours as needed for wheezing or shortness of breath.   ATENOLOL (TENORMIN) 50 MG TABLET    Take 1 tablet (50 mg total) by mouth daily.   BUDESONIDE-FORMOTEROL (SYMBICORT) 160-4.5 MCG/ACT INHALER    Inhale 2 puffs into the lungs 2 (two) times daily.   DULOXETINE (CYMBALTA) 60 MG CAPSULE    Take 60 mg by mouth at bedtime.    FLUTICASONE (FLONASE) 50 MCG/ACT NASAL SPRAY    Place 1 spray into both nostrils 2 (two) times daily.   GABAPENTIN (NEURONTIN) 600 MG TABLET    Take 1 tablet (600 mg total) by mouth daily.   HYDROXYZINE (ATARAX/VISTARIL) 10 MG TABLET    Take 1 tablet (10 mg total) by mouth 3 (three) times daily as needed for anxiety.   INSULIN NPH-REGULAR HUMAN (NOVOLIN 70/30) (70-30) 100 UNIT/ML INJECTION    Inject 30 Units into the skin 2 (two) times daily with a meal.   LACTULOSE (CHRONULAC) 10 GM/15ML SOLUTION    Take 45 mLs (30 g total) by mouth 2 (two) times daily.   LINAGLIPTIN (TRADJENTA) 5 MG TABS TABLET    Take 1 tablet (5 mg total) by mouth daily with breakfast.   MULTIPLE VITAMIN (MULTIVITAMIN) TABLET    Take 1 tablet by mouth daily. Reported on 09/12/2015   NABUMETONE (RELAFEN) 500 MG TABLET    Take 1 tablet (500 mg total) by mouth 2 (two) times daily.   OMEPRAZOLE (PRILOSEC) 20 MG CAPSULE    Take 20 mg by mouth daily.    ONDANSETRON (ZOFRAN ODT) 8 MG DISINTEGRATING TABLET    Take 1 tablet (8 mg total) by mouth every 8 (eight) hours as needed for nausea or vomiting.   POLYETHYLENE GLYCOL (MIRALAX / GLYCOLAX) PACKET    Take 17 g by mouth daily as needed for mild constipation.   PSYLLIUM (METAMUCIL PO)    Take 1 scoop by mouth daily as needed (for constipation).    TAMSULOSIN (FLOMAX) 0.4 MG CAPS CAPSULE    Take 1 capsule (0.4 mg total) by mouth  daily.   TIZANIDINE (ZANAFLEX) 4 MG TABLET    Take 1 tablet (4 mg total) by mouth every 8 (  eight) hours as needed for muscle spasms.   TRIMETHOPRIM-POLYMYXIN B (POLYTRIM) OPHTHALMIC SOLUTION    Place 1 drop into the left eye every 4 (four) hours.  Modified Medications   No medications on file  Discontinued Medications   No medications on file     SIGNIFICANT DIAGNOSTIC EXAMS   LABS REVIEWED:   09-23-15: glucose 123; bun 9; creat 1.04; k+ 3.2; na++141; phos 2.4; albumin 2.5   Review of Systems  Constitutional: Negative for malaise/fatigue.  Respiratory: Negative for cough.   Cardiovascular: Negative for chest pain and leg swelling.  Gastrointestinal: Negative for abdominal pain and constipation.  Musculoskeletal: Negative for myalgias and back pain.  Skin: Negative.   Psychiatric/Behavioral: The patient is not nervous/anxious.     Physical Exam  Constitutional: No distress.  Obese   Eyes: Conjunctivae are normal.  Neck: Neck supple. No JVD present. No thyromegaly present.  Cardiovascular: Normal rate, regular rhythm and intact distal pulses.   Respiratory: Effort normal and breath sounds normal. No respiratory distress. She has no wheezes.  GI: Soft. Bowel sounds are normal. She exhibits no distension. There is no tenderness.  Musculoskeletal: She exhibits edema.  Able to move all extremities  1+ lower extremity edema   Lymphadenopathy:    She has no cervical adenopathy.  Neurological: She is alert.  Skin: Skin is warm and dry. She is not diaphoretic.  Psychiatric: She has a normal mood and affect.      ASSESSMENT/ PLAN:   Patient is being discharged with the following home health services:  Pt/ot/rn to evaluate and treat as indicated for gait balance strength adl training and medication management.   Patient is being discharged with the following durable medical equipment: none required at this time  Patient has been advised to f/u with their PCP in 1-2 weeks to  bring them up to date on their rehab stay.  Social services at facility is responsible for arranging this appointment.  Pt was provided with a 30 day supply of prescriptions for medications and refills must be obtained from their PCP.  For controlled substances, a more limited supply may be provided adequate until PCP appointment only.   Time spent with patient 40 minutes >50% time spent counseling; reviewing medical record; tests; labs; and developing future plan of care   Ok Edwards NP [facility]  Contact [phone] Monday through Friday 8am- 5pm  After hours call [phone]

## 2015-10-24 ENCOUNTER — Ambulatory Visit (INDEPENDENT_AMBULATORY_CARE_PROVIDER_SITE_OTHER): Payer: Medicare Other | Admitting: Podiatry

## 2015-10-24 ENCOUNTER — Encounter: Payer: Self-pay | Admitting: Podiatry

## 2015-10-24 DIAGNOSIS — M21961 Unspecified acquired deformity of right lower leg: Secondary | ICD-10-CM

## 2015-10-24 DIAGNOSIS — M659 Synovitis and tenosynovitis, unspecified: Secondary | ICD-10-CM

## 2015-10-24 DIAGNOSIS — R262 Difficulty in walking, not elsewhere classified: Secondary | ICD-10-CM | POA: Diagnosis not present

## 2015-10-24 MED ORDER — OXYCODONE-ACETAMINOPHEN 10-325 MG PO TABS: 1.0000 | ORAL_TABLET | Freq: Four times a day (QID) | ORAL | Status: DC | PRN

## 2015-10-24 NOTE — Patient Instructions (Signed)
Seen for pain in right foot. Recovering from neck surgery and doing well. All nails debrided. Pain medication prescribed as per request. Return as needed.

## 2015-10-24 NOTE — Progress Notes (Signed)
Subjective: 45 year old female presents accompanied by her daughter for follow up on right foot pain. She has had neck surgery on 09/16/15 and was discharged on 09/23/15. She was in rehab till 10/22/15. Doing well walking with a wheeled walker and request for pain medication.  There is no change in her foot condition. During her rehab she had to be on her feet.   On her last visit she was evaluated for eligibility for motorized wheel chair.   Objective: Dermatologic: All nails are thick and hypertrophic.  Vascular status are within normal. Neurologic: Pain in both dorsum and ankle with ambulation bilateral. Pain extending upper thigh bilateral.  Recently collapsing and tripping down to floor a couple times a day.  Experiencing daily walking difficulties.  Orthopedic: Positive of elevated first ray with forefoot varus right. Tight Achilles tendon bilateral.   Her last X-ray report indicate; Right foot has positive of bone callus around fusion site of 1st MCJ, irregular joint space at Naviculocuneiform joint and ankle joint right foot with elevated first metatarsal bone. Old fusion site at first [place] left appears to be fused with bone callus. Left foot show in lateral view elevated first ray, normal and congruous joint surfaces and spaces at TNJ, and ankle joint.  Impressions: Right foot and ankle pain due to osteoarthropathy of midfoot and ankle joint. Left foot pain from faulty biomechanics.   Assessment: 1. Pain and swelling in foot and ankle radiating to thigh. 2. Difficulty walking and frequent fall.  3. Osteoarthropathy right foot and ankle. 4. Faulty biomechanics bilateral.  5. Ankle Equinus bilateral.  6. Diabetic under control. 7. Weakness left arm. 8. Chronic knee pain. 9. Onychomycosis x 10.  Plan: Continue in tennis shoes and walker. All nails debrided. Will wait for her full recovery from her recent neck surgery.  May take pain medication if needed. Return as needed.

## 2015-10-26 DIAGNOSIS — I5032 Chronic diastolic (congestive) heart failure: Secondary | ICD-10-CM | POA: Diagnosis not present

## 2015-10-26 DIAGNOSIS — M4712 Other spondylosis with myelopathy, cervical region: Secondary | ICD-10-CM | POA: Diagnosis not present

## 2015-10-26 DIAGNOSIS — M1990 Unspecified osteoarthritis, unspecified site: Secondary | ICD-10-CM | POA: Diagnosis not present

## 2015-10-26 DIAGNOSIS — Z4789 Encounter for other orthopedic aftercare: Secondary | ICD-10-CM | POA: Diagnosis not present

## 2015-10-26 DIAGNOSIS — I110 Hypertensive heart disease with heart failure: Secondary | ICD-10-CM | POA: Diagnosis not present

## 2015-10-26 DIAGNOSIS — E1165 Type 2 diabetes mellitus with hyperglycemia: Secondary | ICD-10-CM | POA: Diagnosis not present

## 2015-10-27 DIAGNOSIS — M4712 Other spondylosis with myelopathy, cervical region: Secondary | ICD-10-CM | POA: Diagnosis not present

## 2015-10-27 DIAGNOSIS — Z6841 Body Mass Index (BMI) 40.0 and over, adult: Secondary | ICD-10-CM | POA: Diagnosis not present

## 2015-10-27 DIAGNOSIS — I10 Essential (primary) hypertension: Secondary | ICD-10-CM | POA: Diagnosis not present

## 2015-11-01 DIAGNOSIS — I10 Essential (primary) hypertension: Secondary | ICD-10-CM | POA: Diagnosis not present

## 2015-11-01 DIAGNOSIS — I110 Hypertensive heart disease with heart failure: Secondary | ICD-10-CM | POA: Diagnosis not present

## 2015-11-01 DIAGNOSIS — M1990 Unspecified osteoarthritis, unspecified site: Secondary | ICD-10-CM | POA: Diagnosis not present

## 2015-11-01 DIAGNOSIS — G629 Polyneuropathy, unspecified: Secondary | ICD-10-CM | POA: Diagnosis not present

## 2015-11-01 DIAGNOSIS — M19079 Primary osteoarthritis, unspecified ankle and foot: Secondary | ICD-10-CM | POA: Diagnosis not present

## 2015-11-01 DIAGNOSIS — I119 Hypertensive heart disease without heart failure: Secondary | ICD-10-CM | POA: Diagnosis not present

## 2015-11-01 DIAGNOSIS — M4712 Other spondylosis with myelopathy, cervical region: Secondary | ICD-10-CM | POA: Diagnosis not present

## 2015-11-01 DIAGNOSIS — G4733 Obstructive sleep apnea (adult) (pediatric): Secondary | ICD-10-CM | POA: Diagnosis not present

## 2015-11-01 DIAGNOSIS — E1165 Type 2 diabetes mellitus with hyperglycemia: Secondary | ICD-10-CM | POA: Diagnosis not present

## 2015-11-01 DIAGNOSIS — J45909 Unspecified asthma, uncomplicated: Secondary | ICD-10-CM | POA: Diagnosis not present

## 2015-11-01 DIAGNOSIS — E669 Obesity, unspecified: Secondary | ICD-10-CM | POA: Diagnosis not present

## 2015-11-01 DIAGNOSIS — Z4789 Encounter for other orthopedic aftercare: Secondary | ICD-10-CM | POA: Diagnosis not present

## 2015-11-01 DIAGNOSIS — E119 Type 2 diabetes mellitus without complications: Secondary | ICD-10-CM | POA: Diagnosis not present

## 2015-11-01 DIAGNOSIS — I5032 Chronic diastolic (congestive) heart failure: Secondary | ICD-10-CM | POA: Diagnosis not present

## 2015-11-01 DIAGNOSIS — Z5181 Encounter for therapeutic drug level monitoring: Secondary | ICD-10-CM | POA: Diagnosis not present

## 2015-11-03 ENCOUNTER — Ambulatory Visit: Payer: Medicare Other | Admitting: Internal Medicine

## 2015-11-03 ENCOUNTER — Ambulatory Visit: Payer: Medicare Other | Admitting: Pulmonary Disease

## 2015-11-04 DIAGNOSIS — M4712 Other spondylosis with myelopathy, cervical region: Secondary | ICD-10-CM | POA: Diagnosis not present

## 2015-11-04 DIAGNOSIS — M1990 Unspecified osteoarthritis, unspecified site: Secondary | ICD-10-CM | POA: Diagnosis not present

## 2015-11-04 DIAGNOSIS — E1165 Type 2 diabetes mellitus with hyperglycemia: Secondary | ICD-10-CM | POA: Diagnosis not present

## 2015-11-04 DIAGNOSIS — Z4789 Encounter for other orthopedic aftercare: Secondary | ICD-10-CM | POA: Diagnosis not present

## 2015-11-04 DIAGNOSIS — I5032 Chronic diastolic (congestive) heart failure: Secondary | ICD-10-CM | POA: Diagnosis not present

## 2015-11-04 DIAGNOSIS — I110 Hypertensive heart disease with heart failure: Secondary | ICD-10-CM | POA: Diagnosis not present

## 2015-11-06 ENCOUNTER — Encounter (HOSPITAL_COMMUNITY): Payer: Self-pay

## 2015-11-06 ENCOUNTER — Emergency Department (HOSPITAL_COMMUNITY): Payer: Medicare Other

## 2015-11-06 ENCOUNTER — Emergency Department (HOSPITAL_COMMUNITY)
Admission: EM | Admit: 2015-11-06 | Discharge: 2015-11-06 | Disposition: A | Discharge status: Home / Self Care | Payer: Medicare Other | Attending: Emergency Medicine | Admitting: Emergency Medicine

## 2015-11-06 DIAGNOSIS — E119 Type 2 diabetes mellitus without complications: Secondary | ICD-10-CM | POA: Diagnosis not present

## 2015-11-06 DIAGNOSIS — M1990 Unspecified osteoarthritis, unspecified site: Secondary | ICD-10-CM | POA: Diagnosis not present

## 2015-11-06 DIAGNOSIS — Z8701 Personal history of pneumonia (recurrent): Secondary | ICD-10-CM | POA: Diagnosis not present

## 2015-11-06 DIAGNOSIS — G8929 Other chronic pain: Secondary | ICD-10-CM | POA: Diagnosis not present

## 2015-11-06 DIAGNOSIS — S3992XA Unspecified injury of lower back, initial encounter: Secondary | ICD-10-CM | POA: Diagnosis not present

## 2015-11-06 DIAGNOSIS — F319 Bipolar disorder, unspecified: Secondary | ICD-10-CM | POA: Insufficient documentation

## 2015-11-06 DIAGNOSIS — Z7984 Long term (current) use of oral hypoglycemic drugs: Secondary | ICD-10-CM | POA: Diagnosis not present

## 2015-11-06 DIAGNOSIS — Z87891 Personal history of nicotine dependence: Secondary | ICD-10-CM | POA: Diagnosis not present

## 2015-11-06 DIAGNOSIS — Z91040 Latex allergy status: Secondary | ICD-10-CM | POA: Insufficient documentation

## 2015-11-06 DIAGNOSIS — I10 Essential (primary) hypertension: Secondary | ICD-10-CM | POA: Insufficient documentation

## 2015-11-06 DIAGNOSIS — J45909 Unspecified asthma, uncomplicated: Secondary | ICD-10-CM | POA: Diagnosis not present

## 2015-11-06 DIAGNOSIS — Z7951 Long term (current) use of inhaled steroids: Secondary | ICD-10-CM | POA: Diagnosis not present

## 2015-11-06 DIAGNOSIS — R200 Anesthesia of skin: Secondary | ICD-10-CM

## 2015-11-06 DIAGNOSIS — G4733 Obstructive sleep apnea (adult) (pediatric): Secondary | ICD-10-CM | POA: Insufficient documentation

## 2015-11-06 DIAGNOSIS — Z794 Long term (current) use of insulin: Secondary | ICD-10-CM | POA: Insufficient documentation

## 2015-11-06 DIAGNOSIS — Z9981 Dependence on supplemental oxygen: Secondary | ICD-10-CM | POA: Diagnosis not present

## 2015-11-06 DIAGNOSIS — F419 Anxiety disorder, unspecified: Secondary | ICD-10-CM | POA: Insufficient documentation

## 2015-11-06 DIAGNOSIS — K219 Gastro-esophageal reflux disease without esophagitis: Secondary | ICD-10-CM | POA: Diagnosis not present

## 2015-11-06 DIAGNOSIS — M5021 Other cervical disc displacement,  high cervical region: Secondary | ICD-10-CM | POA: Diagnosis not present

## 2015-11-06 DIAGNOSIS — M5416 Radiculopathy, lumbar region: Secondary | ICD-10-CM | POA: Diagnosis not present

## 2015-11-06 DIAGNOSIS — M79604 Pain in right leg: Secondary | ICD-10-CM | POA: Diagnosis present

## 2015-11-06 DIAGNOSIS — Z79899 Other long term (current) drug therapy: Secondary | ICD-10-CM | POA: Insufficient documentation

## 2015-11-06 LAB — CBC WITH DIFFERENTIAL/PLATELET
BASOS ABS: 0 10*3/uL (ref 0.0–0.1)
Basophils Relative: 0 %
Eosinophils Absolute: 0.1 10*3/uL (ref 0.0–0.7)
Eosinophils Relative: 2 %
HEMATOCRIT: 39 % (ref 36.0–46.0)
HEMOGLOBIN: 12 g/dL (ref 12.0–15.0)
LYMPHS PCT: 32 %
Lymphs Abs: 1.6 10*3/uL (ref 0.7–4.0)
MCH: 25.6 pg — ABNORMAL LOW (ref 26.0–34.0)
MCHC: 30.8 g/dL (ref 30.0–36.0)
MCV: 83.2 fL (ref 78.0–100.0)
MONO ABS: 0.3 10*3/uL (ref 0.1–1.0)
MONOS PCT: 7 %
NEUTROS ABS: 2.9 10*3/uL (ref 1.7–7.7)
Neutrophils Relative %: 59 %
Platelets: 224 10*3/uL (ref 150–400)
RBC: 4.69 MIL/uL (ref 3.87–5.11)
RDW: 13.4 % (ref 11.5–15.5)
WBC: 4.9 10*3/uL (ref 4.0–10.5)

## 2015-11-06 LAB — BASIC METABOLIC PANEL
ANION GAP: 7 (ref 5–15)
BUN: 8 mg/dL (ref 6–20)
CALCIUM: 9.2 mg/dL (ref 8.9–10.3)
CHLORIDE: 98 mmol/L — AB (ref 101–111)
CO2: 28 mmol/L (ref 22–32)
Creatinine, Ser: 1.04 mg/dL — ABNORMAL HIGH (ref 0.44–1.00)
GFR calc Af Amer: >60 mL/min (ref >60)
GFR calc non Af Amer: >60 mL/min (ref >60)
GLUCOSE: 261 mg/dL — AB (ref 65–99)
Potassium: 4 mmol/L (ref 3.5–5.1)
Sodium: 133 mmol/L — ABNORMAL LOW (ref 135–145)

## 2015-11-06 MED ORDER — OXYCODONE-ACETAMINOPHEN 10-325 MG PO TABS: 1.0000 | ORAL_TABLET | Freq: Four times a day (QID) | ORAL | Status: DC | PRN

## 2015-11-06 MED ORDER — LORAZEPAM 2 MG/ML IJ SOLN
2.0000 mg | Freq: Once | INTRAMUSCULAR | Status: AC
  Administered 2015-11-06: 2 mg via INTRAVENOUS
  Filled 2015-11-06: qty 1

## 2015-11-06 MED ORDER — MORPHINE SULFATE (PF) 4 MG/ML IV SOLN
4.0000 mg | Freq: Once | INTRAVENOUS | Status: AC
  Administered 2015-11-06: 4 mg via INTRAVENOUS
  Filled 2015-11-06: qty 1

## 2015-11-06 MED ORDER — PREDNISONE 20 MG PO TABS: ORAL_TABLET | ORAL | Status: DC

## 2015-11-06 NOTE — Discharge Instructions (Signed)
Take prednisone as prescribed.   Take motrin for pain.   Take percocet for severe pain.   See Dr. Ralene Ok this week to get follow up   Return to ER if you have worse pain, unable to walk, weakness.

## 2015-11-06 NOTE — ED Notes (Signed)
Patient here with nearly 2 months of upper right leg pain following back surgery. Patient reports that she feels a throbbing pain to leg, denies trauma. Pain startged following the surgery

## 2015-11-06 NOTE — ED Provider Notes (Signed)
CSN: [id number]     Arrival date & time 11/06/15  1434 History   First MD Initiated Contact with Patient 11/06/15 1539     Chief Complaint  Patient presents with  . leg pain      (Consider location/radiation/quality/duration/timing/severity/associated sxs/prior Treatment) The history is provided by the patient.  Diana Henderson is a 45 y.o. female hx of DM, HTN, HL, PTSD, bipolar, Cervical disc disease here presenting with right leg pain and paresthesias. Patient had cervical fusion by Dr. Nunkumar 2 months ago and was in rehab until earlier this month. She states that she's been having throbbing pain on the lateral aspect of her right thigh that is constant and getting worse. Over the last several days, she states that the pain is so bad that it hurts when she touches that area. Denies any weakness in her legs. Denies any trouble walking. Denies any incontinence.    Past Medical History  Diagnosis Date  . Diabetes mellitus   . Hypertension   . Hyperlipidemia   . Depression   . PTSD (post-traumatic stress disorder)   . Pain in joint, ankle and foot 02/17/2013  . Anxiety   . Bipolar 1 disorder ([place])   . Sleep apnea     uses CPAP intermittently, pt. doesn't remember where she had the study  . Asthma     has been eval. by Dr. Gwenette Greet- last 06/2014  . Pneumonia     hosp. for a couple of day- not sure when it was   . GERD (gastroesophageal reflux disease)   . Headache     sometimes has migraines   . Arthritis     osteoarthritis   . OSA on CPAP   . Knee pain, chronic   . Shortness of breath dyspnea   . Complication of anesthesia     09/13/14: malignant HTN with inducction, case cx but no sourse identified; 11/05/14 remifentanyl infusion used to attenuate hypergynamic induction   Past Surgical History  Procedure Laterality Date  . Tubal ligation    . Midtarsal arthrodesis Right 07/17/12    Rt foot  . Remove implant deep Right 07/17/12    Rt foot  . Lapidus fusion Right 01/10/12     Rt foot  . Carpal tunnel release Left 2001    Left wrist  . Gastroc recession extremity Right 3//20/14    Right foot  . Hernia repair      1996  . Remove implant deep Right 07/22/2014    Rt foot @ [facility]  . Cotton osteotomy w/ bone graft Right 09/30/2014    Rt foot @ [facility]  . Anterior cervical decomp/discectomy fusion N/A 11/05/2014    Procedure: ANTERIOR CERVICAL DECOMPRESSION/DISCECTOMY FUSION 1 LEVEL;  Surgeon: Consuella Lose, MD;  Location: [place] [facility];  Service: Neurosurgery;  Laterality: N/A;  Cervical five- cervical seven anterior cervical decompression with fusion interbody prosthesis plating and bonegraft  . Cervical laminectomy  03/31/20176    CERVICAL 4 SEVEN LAMINECTOMIES WITH LATERAL FUSION  . Posterior cervical fusion/foraminotomy N/A 09/16/2015    Procedure: Cervical four - seven laminectomies with lateral mass fusion;  Surgeon: Consuella Lose, MD;  Location: [place] [facility];  Service: Neurosurgery;  Laterality: N/A;  C4-7 laminectomy with lateral mass fusion   Family History  Problem Relation Age of Onset  . Heart attack Father   . Kidney disease Mother    Social History  Substance Use Topics  . Smoking status: Former Smoker -- 2.00 packs/day for 6 years  Types: Cigarettes    Quit date: 06/18/1978  . Smokeless tobacco: Never Used  . Alcohol Use: 0.0 oz/week    0 Standard drinks or equivalent per week     Comment: weekend- use of beer    OB History    No data available     Review of Systems  Neurological: Positive for numbness.  All other systems reviewed and are negative.     Allergies  Celery oil; Pork-derived products; and Latex  Home Medications   Prior to Admission medications   Medication Sig Start Date End Date Taking? Authorizing Provider  acetaminophen (TYLENOL) 325 MG tablet Take 2 tablets (650 mg total) by mouth every 4 (four) hours as needed (temp > 100.5). 09/22/15  Yes [name], MD  albuterol (PROVENTIL HFA;VENTOLIN HFA) 108 (90 BASE)  MCG/ACT inhaler Inhale 1-2 puffs into the lungs every 6 (six) hours as needed for wheezing or shortness of breath. 04/07/15  Yes Chesley Noon Nadeau, PA-C  atenolol (TENORMIN) 50 MG tablet Take 1 tablet (50 mg total) by mouth daily. 09/10/14  Yes Eugenie Filler, MD  budesonide-formoterol [facility]) 160-4.5 MCG/ACT inhaler Inhale 2 puffs into the lungs 2 (two) times daily.   Yes Historical Provider, MD  DULoxetine (CYMBALTA) 60 MG capsule Take 60 mg by mouth at bedtime.    Yes Historical Provider, MD  fluticasone (FLONASE) 50 MCG/ACT nasal spray Place 1 spray into both nostrils 2 (two) times daily. 04/07/15  Yes Nona Dell, PA-C  gabapentin (NEURONTIN) 600 MG tablet Take 1 tablet (600 mg total) by mouth daily. Patient taking differently: Take 600 mg by mouth 2 (two) times daily.  09/23/15  Yes [name], MD  insulin NPH-regular Human (NOVOLIN 70/30) (70-30) 100 UNIT/ML injection Inject 30 Units into the skin 2 (two) times daily with a meal.   Yes Historical Provider, MD  lactulose (CHRONULAC) 10 GM/15ML solution Take 45 mLs (30 g total) by mouth 2 (two) times daily. 09/22/15  Yes [name], MD  linagliptin (TRADJENTA) 5 MG TABS tablet Take 1 tablet (5 mg total) by mouth daily with breakfast. 09/22/15  Yes [name], MD  nabumetone (RELAFEN) 500 MG tablet Take 1 tablet (500 mg total) by mouth 2 (two) times daily. 07/25/15  Yes Myeong O Sheard, DPM  omeprazole (PRILOSEC) 20 MG capsule Take 20 mg by mouth daily.    Yes Historical Provider, MD  ondansetron (ZOFRAN ODT) 8 MG disintegrating tablet Take 1 tablet (8 mg total) by mouth every 8 (eight) hours as needed for nausea or vomiting. 01/28/14  Yes Sherwood Gambler, MD  oxyCODONE-acetaminophen (PERCOCET) 10-325 MG tablet Take 1 tablet by mouth every 6 (six) hours as needed for pain. 10/24/15  Yes Myeong O Sheard, DPM  polyethylene glycol (MIRALAX / GLYCOLAX) packet Take 17 g by mouth daily as needed for mild constipation. 09/22/15  Yes  [name], MD  Psyllium (METAMUCIL PO) Take 1 scoop by mouth daily as needed (for constipation).    Yes Historical Provider, MD  tamsulosin (FLOMAX) 0.4 MG CAPS capsule Take 1 capsule (0.4 mg total) by mouth daily. 09/23/15  Yes Consuella Lose, MD  tiZANidine (ZANAFLEX) 4 MG tablet Take 1 tablet (4 mg total) by mouth every 8 (eight) hours as needed for muscle spasms. 09/10/14  Yes Eugenie Filler, MD  trimethoprim-polymyxin b (POLYTRIM) ophthalmic solution Place 1 drop into the left eye every 4 (four) hours. 02/19/14   Donne Hazel, MD   BP 133/79 mmHg  Pulse 73  Temp(Src)  98.3 F (36.8 C) (Oral)  Resp 18  SpO2 100% Physical Exam  Constitutional: She is oriented to person, place, and time.  Chronically ill, uncomfortable   HENT:  Head: Normocephalic.  Mouth/Throat: Oropharynx is clear and moist.  Eyes: Conjunctivae are normal. Pupils are equal, round, and reactive to light.  Neck: Normal range of motion. Neck supple.  Cardiovascular: Normal rate, regular rhythm and normal heart sounds.   Pulmonary/Chest: Effort normal and breath sounds normal. No respiratory distress. She has no wheezes. She has no rales.  Abdominal: Soft. Bowel sounds are normal. She exhibits no distension. There is no tenderness. There is no rebound.  Musculoskeletal: Normal range of motion. She exhibits no edema or tenderness.  Neurological: She is alert and oriented to person, place, and time.  CN 2-12 intact. Nl hand grasps bilaterally. Tender to touch and dec sensation lateral aspect R leg. Nl reflexes bilateral patellar. No saddle anesthesia. Nl strength bilateral legs   Skin: Skin is warm and dry.  Psychiatric: She has a normal mood and affect. Her behavior is normal. Judgment and thought content normal.  Nursing note and vitals reviewed.   ED Course  Procedures (including critical care time) Labs Review Labs Reviewed  CBC WITH DIFFERENTIAL/PLATELET - Abnormal; Notable for the following:    MCH 25.6  (*)    All other components within normal limits  BASIC METABOLIC PANEL - Abnormal; Notable for the following:    Sodium 133 (*)    Chloride 98 (*)    Glucose, Bld 261 (*)    Creatinine, Ser 1.04 (*)    All other components within normal limits    Imaging Review Mr Cervical Spine Wo Contrast  11/06/2015  CLINICAL DATA:  Fall to 3 days ago. Increasing weakness in the lower extremities right greater than left. Right leg numbness. EXAM: MRI CERVICAL AND LUMBAR SPINE WITHOUT CONTRAST TECHNIQUE: Multiplanar and multiecho pulse sequences of the cervical spine, to include the craniocervical junction and cervicothoracic junction, and lumbar spine, were obtained without intravenous contrast. COMPARISON:  08/17/2015 FINDINGS: Despite efforts by the technologist and patient, motion artifact is present on today's exam and could not be eliminated. This reduces exam sensitivity and specificity. The patient was sedated and had difficulty staying awake,and snored through the exam. MRI CERVICAL SPINE FINDINGS Alignment: No malalignment. There is straightening of the cervical lordosis. Vertebrae: Anterior plate and screw fixator at C5-6. No significant vertebral marrow edema is identified. Since the prior MRI and there has been posterior rod and facet screw placement at the C4-C5-C6-C7 level. This appears to been done on September 16, 2015. This increases the amount of metal artifact in the cervical spine, further degraded images. Cord: Equivocal cord edema or myelomalacia eccentric to the right at the C5-6 level. Posterior Fossa, vertebral arteries, paraspinal tissues: Expected posterior postoperative findings following the laminectomies at C4-C5 C6 and C7. The vertebral arteries are difficult to characterize on the axial images. Disc levels: C2-3:  Unremarkable. C3-4: Moderate central narrowing of the thecal sac due to short pedicles and disc bulge. Mild bilateral foraminal stenosis due to facet and uncinate spurring. C4-5:  Moderate central narrowing of the thecal sac due to short pedicles and right eccentric disc bulge. C5-6: A right paracentral spur or disc protrusion appears to narrow the AP diameter of the thecal sac to about 5 mm compatible with prominent stenosis, on images 20-21 series 8. C6-7: Short pedicles cause mild central narrowing of the thecal sac. Impingement at this level has improved. C7-T1:  Unremarkable. MRI LUMBAR SPINE FINDINGS Segmentation: The lowest lumbar type non-rib-bearing vertebra is labeled as L5. Alignment:  Normal. Vertebrae:  Unremarkable Conus medullaris: Extends to the L1 Level and appears normal. Paraspinal and other soft tissues: Mild prominence of lumbar epidural adipose tissues and mildly short pedicles. Disc levels: L1-2:  Unremarkable. L2-3: Moderate central narrowing of the thecal sac due to short pedicles. L3-4: Moderate central narrowing of the thecal sac due to short pedicles. Slight displacement of the right L3 nerve in the lateral extraforaminal space due to mild underlying disc bulge. L4-5: Moderate central narrowing of the thecal sac with borderline right foraminal stenosis due to short pedicles, disc bulge, and facet arthropathy. L5-S1:  No impingement.  Tapered thecal sac at this level. IMPRESSION: 1. Cervical spine: Interval posterolateral rod and facet screw fixation with posterior decompression at C4-C5-C6-C7. Dominant impingement due to a right paracentral spur or disc protrusion at C5-6, potentially with very low-level cord edema or myelomalacia at this level. Otherwise short pedicles, spondylosis, and degenerative disc disease cause moderate impingement at C3-4 and C4-5, and mild impingement at C6-7. 2. Lumbar spine: Spondylosis, degenerative disc disease, and short pedicles cause moderate impingement at L2-3, L3-4, and L4-5. Electronically Signed   By: Van Clines M.D.   On: 11/06/2015 19:00   Mr Lumbar Spine Wo Contrast  11/06/2015  CLINICAL DATA:  Fall to 3 days  ago. Increasing weakness in the lower extremities right greater than left. Right leg numbness. EXAM: MRI CERVICAL AND LUMBAR SPINE WITHOUT CONTRAST TECHNIQUE: Multiplanar and multiecho pulse sequences of the cervical spine, to include the craniocervical junction and cervicothoracic junction, and lumbar spine, were obtained without intravenous contrast. COMPARISON:  08/17/2015 FINDINGS: Despite efforts by the technologist and patient, motion artifact is present on today's exam and could not be eliminated. This reduces exam sensitivity and specificity. The patient was sedated and had difficulty staying awake,and snored through the exam. MRI CERVICAL SPINE FINDINGS Alignment: No malalignment. There is straightening of the cervical lordosis. Vertebrae: Anterior plate and screw fixator at C5-6. No significant vertebral marrow edema is identified. Since the prior MRI and there has been posterior rod and facet screw placement at the C4-C5-C6-C7 level. This appears to been done on September 16, 2015. This increases the amount of metal artifact in the cervical spine, further degraded images. Cord: Equivocal cord edema or myelomalacia eccentric to the right at the C5-6 level. Posterior Fossa, vertebral arteries, paraspinal tissues: Expected posterior postoperative findings following the laminectomies at C4-C5 C6 and C7. The vertebral arteries are difficult to characterize on the axial images. Disc levels: C2-3:  Unremarkable. C3-4: Moderate central narrowing of the thecal sac due to short pedicles and disc bulge. Mild bilateral foraminal stenosis due to facet and uncinate spurring. C4-5: Moderate central narrowing of the thecal sac due to short pedicles and right eccentric disc bulge. C5-6: A right paracentral spur or disc protrusion appears to narrow the AP diameter of the thecal sac to about 5 mm compatible with prominent stenosis, on images 20-21 series 8. C6-7: Short pedicles cause mild central narrowing of the thecal sac.  Impingement at this level has improved. C7-T1:  Unremarkable. MRI LUMBAR SPINE FINDINGS Segmentation: The lowest lumbar type non-rib-bearing vertebra is labeled as L5. Alignment:  Normal. Vertebrae:  Unremarkable Conus medullaris: Extends to the L1 Level and appears normal. Paraspinal and other soft tissues: Mild prominence of lumbar epidural adipose tissues and mildly short pedicles. Disc levels: L1-2:  Unremarkable. L2-3: Moderate central narrowing of the thecal sac  due to short pedicles. L3-4: Moderate central narrowing of the thecal sac due to short pedicles. Slight displacement of the right L3 nerve in the lateral extraforaminal space due to mild underlying disc bulge. L4-5: Moderate central narrowing of the thecal sac with borderline right foraminal stenosis due to short pedicles, disc bulge, and facet arthropathy. L5-S1:  No impingement.  Tapered thecal sac at this level. IMPRESSION: 1. Cervical spine: Interval posterolateral rod and facet screw fixation with posterior decompression at C4-C5-C6-C7. Dominant impingement due to a right paracentral spur or disc protrusion at C5-6, potentially with very low-level cord edema or myelomalacia at this level. Otherwise short pedicles, spondylosis, and degenerative disc disease cause moderate impingement at C3-4 and C4-5, and mild impingement at C6-7. 2. Lumbar spine: Spondylosis, degenerative disc disease, and short pedicles cause moderate impingement at L2-3, L3-4, and L4-5. Electronically Signed   By: Van Clines M.D.   On: 11/06/2015 19:00   I have personally reviewed and evaluated these images and lab results as part of my medical decision-making.   EKG Interpretation None      MDM   Final diagnoses:  Right leg numbness   Diana Henderson is a 45 y.o. female here with R leg paresthesias going on since cervical laminectomy 2 months ago, now worsening. I think likely from the laminectomy vs lumbar radiculopathy vs peripheral radiculopathy from  diabetes. Able to walk with cane (baseline). Will get labs, MRI cervical and lumbar.   7:24 PM MRI showed post op changes at the cervical spine. Lumbar spine showed multi level pedicle impingement likely causing her symptoms. Consulted Dr. Ronnald Ramp from neurosurgery, who states that given that she has no motor deficits, she can be discharged to see Dr. Ralene Ok in the office to review the MRI and to go over plans. Will dc home with steroids, percocet.     Wandra Arthurs, MD 11/06/15 [phone]

## 2015-11-08 DIAGNOSIS — Z4789 Encounter for other orthopedic aftercare: Secondary | ICD-10-CM | POA: Diagnosis not present

## 2015-11-08 DIAGNOSIS — E1165 Type 2 diabetes mellitus with hyperglycemia: Secondary | ICD-10-CM | POA: Diagnosis not present

## 2015-11-08 DIAGNOSIS — M1990 Unspecified osteoarthritis, unspecified site: Secondary | ICD-10-CM | POA: Diagnosis not present

## 2015-11-08 DIAGNOSIS — I110 Hypertensive heart disease with heart failure: Secondary | ICD-10-CM | POA: Diagnosis not present

## 2015-11-08 DIAGNOSIS — M4712 Other spondylosis with myelopathy, cervical region: Secondary | ICD-10-CM | POA: Diagnosis not present

## 2015-11-08 DIAGNOSIS — I5032 Chronic diastolic (congestive) heart failure: Secondary | ICD-10-CM | POA: Diagnosis not present

## 2015-11-11 DIAGNOSIS — I5032 Chronic diastolic (congestive) heart failure: Secondary | ICD-10-CM | POA: Diagnosis not present

## 2015-11-11 DIAGNOSIS — Z4789 Encounter for other orthopedic aftercare: Secondary | ICD-10-CM | POA: Diagnosis not present

## 2015-11-11 DIAGNOSIS — I110 Hypertensive heart disease with heart failure: Secondary | ICD-10-CM | POA: Diagnosis not present

## 2015-11-11 DIAGNOSIS — M4712 Other spondylosis with myelopathy, cervical region: Secondary | ICD-10-CM | POA: Diagnosis not present

## 2015-11-11 DIAGNOSIS — M1990 Unspecified osteoarthritis, unspecified site: Secondary | ICD-10-CM | POA: Diagnosis not present

## 2015-11-11 DIAGNOSIS — E1165 Type 2 diabetes mellitus with hyperglycemia: Secondary | ICD-10-CM | POA: Diagnosis not present

## 2015-11-12 ENCOUNTER — Emergency Department (HOSPITAL_COMMUNITY)
Admission: EM | Admit: 2015-11-12 | Discharge: 2015-11-12 | Disposition: A | Discharge status: Home / Self Care | Payer: Medicare Other | Attending: Emergency Medicine | Admitting: Emergency Medicine

## 2015-11-12 ENCOUNTER — Encounter (HOSPITAL_COMMUNITY): Payer: Self-pay | Admitting: *Deleted

## 2015-11-12 DIAGNOSIS — Z794 Long term (current) use of insulin: Secondary | ICD-10-CM | POA: Diagnosis not present

## 2015-11-12 DIAGNOSIS — Z91040 Latex allergy status: Secondary | ICD-10-CM | POA: Diagnosis not present

## 2015-11-12 DIAGNOSIS — Z87891 Personal history of nicotine dependence: Secondary | ICD-10-CM | POA: Diagnosis not present

## 2015-11-12 DIAGNOSIS — E1140 Type 2 diabetes mellitus with diabetic neuropathy, unspecified: Secondary | ICD-10-CM | POA: Diagnosis not present

## 2015-11-12 DIAGNOSIS — J45909 Unspecified asthma, uncomplicated: Secondary | ICD-10-CM | POA: Diagnosis not present

## 2015-11-12 DIAGNOSIS — F319 Bipolar disorder, unspecified: Secondary | ICD-10-CM | POA: Insufficient documentation

## 2015-11-12 DIAGNOSIS — G629 Polyneuropathy, unspecified: Secondary | ICD-10-CM

## 2015-11-12 DIAGNOSIS — I10 Essential (primary) hypertension: Secondary | ICD-10-CM | POA: Diagnosis not present

## 2015-11-12 DIAGNOSIS — M25551 Pain in right hip: Secondary | ICD-10-CM | POA: Diagnosis present

## 2015-11-12 DIAGNOSIS — M545 Low back pain: Secondary | ICD-10-CM

## 2015-11-12 DIAGNOSIS — E785 Hyperlipidemia, unspecified: Secondary | ICD-10-CM | POA: Insufficient documentation

## 2015-11-12 DIAGNOSIS — F4310 Post-traumatic stress disorder, unspecified: Secondary | ICD-10-CM | POA: Insufficient documentation

## 2015-11-12 DIAGNOSIS — Z79899 Other long term (current) drug therapy: Secondary | ICD-10-CM | POA: Diagnosis not present

## 2015-11-12 LAB — CBC WITH DIFFERENTIAL/PLATELET
Basophils Absolute: 0 10*3/uL (ref 0.0–0.1)
Basophils Relative: 0 %
Eosinophils Absolute: 0.1 10*3/uL (ref 0.0–0.7)
Eosinophils Relative: 1 %
HEMATOCRIT: 37.6 % (ref 36.0–46.0)
HEMOGLOBIN: 11.6 g/dL — AB (ref 12.0–15.0)
LYMPHS ABS: 3.1 10*3/uL (ref 0.7–4.0)
LYMPHS PCT: 31 %
MCH: 25.1 pg — AB (ref 26.0–34.0)
MCHC: 30.9 g/dL (ref 30.0–36.0)
MCV: 81.4 fL (ref 78.0–100.0)
MONOS PCT: 7 %
Monocytes Absolute: 0.7 10*3/uL (ref 0.1–1.0)
NEUTROS ABS: 6.2 10*3/uL (ref 1.7–7.7)
NEUTROS PCT: 61 %
Platelets: 214 10*3/uL (ref 150–400)
RBC: 4.62 MIL/uL (ref 3.87–5.11)
RDW: 13.3 % (ref 11.5–15.5)
WBC: 10.1 10*3/uL (ref 4.0–10.5)

## 2015-11-12 LAB — I-STAT CHEM 8, ED
BUN: 8 mg/dL (ref 6–20)
CALCIUM ION: 1.19 mmol/L (ref 1.12–1.23)
CREATININE: 0.9 mg/dL (ref 0.44–1.00)
Chloride: 92 mmol/L — ABNORMAL LOW (ref 101–111)
GLUCOSE: 436 mg/dL — AB (ref 65–99)
HEMATOCRIT: 41 % (ref 36.0–46.0)
Hemoglobin: 13.9 g/dL (ref 12.0–15.0)
Potassium: 3.9 mmol/L (ref 3.5–5.1)
Sodium: 134 mmol/L — ABNORMAL LOW (ref 135–145)
TCO2: 27 mmol/L (ref 0–100)

## 2015-11-12 MED ORDER — HYDROMORPHONE HCL 1 MG/ML IJ SOLN
2.0000 mg | Freq: Once | INTRAMUSCULAR | Status: AC
  Administered 2015-11-12: 2 mg via INTRAMUSCULAR
  Filled 2015-11-12: qty 2

## 2015-11-12 MED ORDER — KETOROLAC TROMETHAMINE 15 MG/ML IJ SOLN: 15.0000 mg | Freq: Once | INTRAMUSCULAR | Status: DC

## 2015-11-12 MED ORDER — NAPROXEN 250 MG PO TABS
500.0000 mg | ORAL_TABLET | Freq: Once | ORAL | Status: AC
  Administered 2015-11-12: 500 mg via ORAL
  Filled 2015-11-12: qty 2

## 2015-11-12 MED ORDER — HYDROMORPHONE HCL 1 MG/ML IJ SOLN: 1.0000 mg | Freq: Once | INTRAMUSCULAR | Status: DC

## 2015-11-12 MED ORDER — DIPHENHYDRAMINE HCL 25 MG PO CAPS
25.0000 mg | ORAL_CAPSULE | Freq: Once | ORAL | Status: AC
  Administered 2015-11-12: 25 mg via ORAL
  Filled 2015-11-12: qty 1

## 2015-11-12 MED ORDER — HYDROCODONE-ACETAMINOPHEN 5-325 MG PO TABS: 1.0000 | ORAL_TABLET | Freq: Four times a day (QID) | ORAL | Status: DC | PRN

## 2015-11-12 NOTE — ED Provider Notes (Signed)
CSN: [id number]     Arrival date & time 11/12/15  0247 History  By signing my name below, I, Stephania Fragmin, attest that this documentation has been prepared under the direction and in the presence of Varney Biles, MD. Electronically Signed: Stephania Fragmin, ED Scribe. 11/12/2015. 4:09 AM.   Chief Complaint  Patient presents with  . Hip Pain   The history is provided by the patient and medical records. No language interpreter was used.    HPI Comments: Diana Henderson is a 45 y.o. female with a history of Cervical four - seven laminectomies with lateral mass fusion on 09/15/15, performed by Dr. Kathyrn Sheriff, as well as a history of DM, HTN, HL, PTSD, bipolar, and cervical dis disease, who presents to the Emergency Department complaining of constant, worsening 9/10 right upper leg pain that radiates down her leg to her toes that began 3 weeks ago. Patient complains of associated numbness in her right lower leg. None of the symptoms are new. Patient denies any known triggers or her pain, and denies any trauma or injury. Nothing makes her pain better or worse. She denies bowel or bladder incontinence. Patient states she had been undergoing rehab until earlier this month, per chart review. She als reports Neurosurgery appt coming up next week.   Pt was seen in the ER with the same complain and had MRI cspine and L spine that were neg.  Past Medical History  Diagnosis Date  . Diabetes mellitus   . Hypertension   . Hyperlipidemia   . Depression   . PTSD (post-traumatic stress disorder)   . Pain in joint, ankle and foot 02/17/2013  . Anxiety   . Bipolar 1 disorder ([place])   . Sleep apnea     uses CPAP intermittently, pt. doesn't remember where she had the study  . Asthma     has been eval. by Dr. Gwenette Greet- last 06/2014  . Pneumonia     hosp. for a couple of day- not sure when it was   . GERD (gastroesophageal reflux disease)   . Headache     sometimes has migraines   . Arthritis     osteoarthritis   .  OSA on CPAP   . Knee pain, chronic   . Shortness of breath dyspnea   . Complication of anesthesia     09/13/14: malignant HTN with inducction, case cx but no sourse identified; 11/05/14 remifentanyl infusion used to attenuate hypergynamic induction   Past Surgical History  Procedure Laterality Date  . Tubal ligation    . Midtarsal arthrodesis Right 07/17/12    Rt foot  . Remove implant deep Right 07/17/12    Rt foot  . Lapidus fusion Right 01/10/12    Rt foot  . Carpal tunnel release Left 2001    Left wrist  . Gastroc recession extremity Right 3//20/14    Right foot  . Hernia repair      1996  . Remove implant deep Right 07/22/2014    Rt foot @ [facility]  . Cotton osteotomy w/ bone graft Right 09/30/2014    Rt foot @ [facility]  . Anterior cervical decomp/discectomy fusion N/A 11/05/2014    Procedure: ANTERIOR CERVICAL DECOMPRESSION/DISCECTOMY FUSION 1 LEVEL;  Surgeon: Consuella Lose, MD;  Location: [place] [facility];  Service: Neurosurgery;  Laterality: N/A;  Cervical five- cervical seven anterior cervical decompression with fusion interbody prosthesis plating and bonegraft  . Cervical laminectomy  03/31/20176    CERVICAL 4 SEVEN LAMINECTOMIES WITH LATERAL FUSION  .  Posterior cervical fusion/foraminotomy N/A 09/16/2015    Procedure: Cervical four - seven laminectomies with lateral mass fusion;  Surgeon: Consuella Lose, MD;  Location: [place] [facility];  Service: Neurosurgery;  Laterality: N/A;  C4-7 laminectomy with lateral mass fusion   Family History  Problem Relation Age of Onset  . Heart attack Father   . Kidney disease Mother    Social History  Substance Use Topics  . Smoking status: Former Smoker -- 2.00 packs/day for 6 years    Types: Cigarettes    Quit date: 06/18/1978  . Smokeless tobacco: Never Used  . Alcohol Use: 0.0 oz/week    0 Standard drinks or equivalent per week     Comment: weekend- use of beer    OB History    No data available     Review of Systems  Musculoskeletal:  Positive for arthralgias.  Neurological: Positive for numbness.  All other systems reviewed and are negative.     Allergies  Celery oil; Pork-derived products; and Latex  Home Medications   Prior to Admission medications   Medication Sig Start Date End Date Taking? Authorizing Provider  acetaminophen (TYLENOL) 325 MG tablet Take 2 tablets (650 mg total) by mouth every 4 (four) hours as needed (temp > 100.5). 09/22/15  Yes [name], MD  albuterol (PROVENTIL HFA;VENTOLIN HFA) 108 (90 BASE) MCG/ACT inhaler Inhale 1-2 puffs into the lungs every 6 (six) hours as needed for wheezing or shortness of breath. 04/07/15  Yes Chesley Noon Nadeau, PA-C  atenolol (TENORMIN) 50 MG tablet Take 1 tablet (50 mg total) by mouth daily. 09/10/14  Yes Eugenie Filler, MD  budesonide-formoterol [facility]) 160-4.5 MCG/ACT inhaler Inhale 2 puffs into the lungs 2 (two) times daily.   Yes Historical Provider, MD  DULoxetine (CYMBALTA) 60 MG capsule Take 60 mg by mouth at bedtime.    Yes Historical Provider, MD  fluticasone (FLONASE) 50 MCG/ACT nasal spray Place 1 spray into both nostrils 2 (two) times daily. Patient taking differently: Place 1 spray into both nostrils daily as needed for allergies.  04/07/15  Yes Nona Dell, PA-C  gabapentin (NEURONTIN) 600 MG tablet Take 1 tablet (600 mg total) by mouth daily. Patient taking differently: Take 600 mg by mouth 2 (two) times daily.  09/23/15  Yes [name], MD  insulin NPH-regular Human (NOVOLIN 70/30) (70-30) 100 UNIT/ML injection Inject 30 Units into the skin 2 (two) times daily with a meal.   Yes Historical Provider, MD  lactulose (CHRONULAC) 10 GM/15ML solution Take 45 mLs (30 g total) by mouth 2 (two) times daily. 09/22/15  Yes [name], MD  linagliptin (TRADJENTA) 5 MG TABS tablet Take 1 tablet (5 mg total) by mouth daily with breakfast. 09/22/15  Yes [name], MD  nabumetone (RELAFEN) 500 MG tablet Take 1 tablet (500 mg total)  by mouth 2 (two) times daily. 07/25/15  Yes Myeong O Sheard, DPM  omeprazole (PRILOSEC) 20 MG capsule Take 20 mg by mouth daily.    Yes Historical Provider, MD  ondansetron (ZOFRAN ODT) 8 MG disintegrating tablet Take 1 tablet (8 mg total) by mouth every 8 (eight) hours as needed for nausea or vomiting. 01/28/14  Yes Sherwood Gambler, MD  oxyCODONE-acetaminophen (PERCOCET) 10-325 MG tablet Take 1 tablet by mouth every 6 (six) hours as needed for pain. 11/06/15  Yes Wandra Arthurs, MD  polyethylene glycol [facility] / Floria Raveling) packet Take 17 g by mouth daily as needed for mild constipation. 09/22/15  Yes [name], MD  Psyllium (METAMUCIL PO) Take 1 scoop by mouth daily as needed (for constipation).    Yes Historical Provider, MD  tamsulosin (FLOMAX) 0.4 MG CAPS capsule Take 1 capsule (0.4 mg total) by mouth daily. 09/23/15  Yes Consuella Lose, MD  tiZANidine (ZANAFLEX) 4 MG tablet Take 1 tablet (4 mg total) by mouth every 8 (eight) hours as needed for muscle spasms. 09/10/14  Yes Eugenie Filler, MD  trimethoprim-polymyxin b (POLYTRIM) ophthalmic solution Place 1 drop into the left eye every 4 (four) hours. Patient taking differently: Place 1 drop into the left eye every 4 (four) hours as needed (irratation).  02/19/14  Yes Donne Hazel, MD  HYDROcodone-acetaminophen (NORCO/VICODIN) 5-325 MG tablet Take 1 tablet by mouth every 6 (six) hours as needed. 11/12/15   Varney Biles, MD  predniSONE (DELTASONE) 20 MG tablet Take 60 mg daily x 2 days then 40 mg daily x 2 days then 20 mg daily x 2 days 11/06/15   Wandra Arthurs, MD   BP 168/115 mmHg  Pulse 90  Temp(Src) 99 F (37.2 C) (Oral)  Resp 17  Wt 315 lb (142.883 kg)  SpO2 99%  LMP 10/13/2015 Physical Exam  Constitutional: She is oriented to person, place, and time. She appears well-developed and well-nourished. No distress.  HENT:  Head: Normocephalic and atraumatic.  Eyes: Conjunctivae and EOM are normal.  Neck: Neck supple. No tracheal deviation  present.  Cervical spine - surgical incision wound is healing well.   Cardiovascular: Normal rate.   Pulmonary/Chest: Effort normal. No respiratory distress.  Lungs are clear to auscultation.   Musculoskeletal: She exhibits tenderness.  Numbness in the right lower extremity. Patient has mild swelling of the right posterior gluteal muscles; tenderness with palpation. No erythema or fluctuance at the site, swelling appears consistent with muscle spasm.  Pt has tenderness to palpation of her entire spine, but it is worst in the lower spine.  Neurological: She is alert and oriented to person, place, and time.  Skin: Skin is warm and dry.  Psychiatric: She has a normal mood and affect. Her behavior is normal.  Nursing note and vitals reviewed.   ED Course  Procedures (including critical care time)  DIAGNOSTIC STUDIES: Oxygen Saturation is 100% on RA, normal by my interpretation.    COORDINATION OF CARE: 4:06 AM - Discussed treatment plan with pt at bedside. Pt verbalized understanding and agreed to plan.   Labs Review Labs Reviewed  CBC WITH DIFFERENTIAL/PLATELET - Abnormal; Notable for the following:    Hemoglobin 11.6 (*)    MCH 25.1 (*)    All other components within normal limits  I-STAT CHEM 8, ED - Abnormal; Notable for the following:    Sodium 134 (*)    Chloride 92 (*)    Glucose, Bld 436 (*)    All other components within normal limits  URINALYSIS, ROUTINE W REFLEX MICROSCOPIC (NOT AT [facility])    Imaging Review No results found. I have personally reviewed and evaluated these images and lab results as part of my medical decision-making.   EKG Interpretation None      MDM   Final diagnoses:  Neuropathy ([place])  Low back pain, unspecified back pain laterality, with sciatica presence unspecified    I personally performed the services described in this documentation, which was scribed in my presence. The recorded information has been reviewed and is accurate.  Pt  comes in with back pain - radicular type. Also has numbness in the same RLE.  -  Recent MRI was neg for acute process. - Symptoms have been present essentially since the surgery. - Could be neuropathy - but N'surgery f/u is reassuring.    Varney Biles, MD 11/12/15 [phone]

## 2015-11-12 NOTE — ED Notes (Signed)
The pt had neck surgery on march 31st on her c-spine.  Since may 6th the pt has had a pain in her rt hip thigh and down her leg  She reports that it feels  Something crawling inside her rt thigh.  lmp April 27th.. The pt ran out of her ultram ear;lier today  She rports that it was not helping the pain much

## 2015-11-12 NOTE — Discharge Instructions (Signed)
See Neurosurgeon as requested. This numbness could be due to the surgery, but could be due to diabetes as well.  Back Pain, Adult Back pain is very common. The pain often gets better over time. The cause of back pain is usually not dangerous. Most people can learn to manage their back pain on their own.  HOME CARE  Watch your back pain for any changes. The following actions may help to lessen any pain you are feeling:  Stay active. Start with short walks on flat ground if you can. Try to walk farther each day.  Exercise regularly as told by your doctor. Exercise helps your back heal faster. It also helps avoid future injury by keeping your muscles strong and flexible.  Do not sit, drive, or stand in one place for more than 30 minutes.  Do not stay in bed. Resting more than 1-2 days can slow down your recovery.  Be careful when you bend or lift an object. Use good form when lifting:  Bend at your knees.  Keep the object close to your body.  Do not twist.  Sleep on a firm mattress. Lie on your side, and bend your knees. If you lie on your back, put a pillow under your knees.  Take medicines only as told by your doctor.  Put ice on the injured area.  Put ice in a plastic bag.  Place a towel between your skin and the bag.  Leave the ice on for 20 minutes, 2-3 times a day for the first 2-3 days. After that, you can switch between ice and heat packs.  Avoid feeling anxious or stressed. Find good ways to deal with stress, such as exercise.  Maintain a healthy weight. Extra weight puts stress on your back. GET HELP IF:   You have pain that does not go away with rest or medicine.  You have worsening pain that goes down into your legs or buttocks.  You have pain that does not get better in one week.  You have pain at night.  You lose weight.  You have a fever or chills. GET HELP RIGHT AWAY IF:   You cannot control when you poop (bowel movement) or pee (urinate).  Your  arms or legs feel weak.  Your arms or legs lose feeling (numbness).  You feel sick to your stomach (nauseous) or throw up (vomit).  You have belly (abdominal) pain.  You feel like you may pass out (faint).   This information is not intended to replace advice given to you by your health care provider. Make sure you discuss any questions you have with your health care provider.   Document Released: 11/21/2007 Document Revised: 06/25/2014 Document Reviewed: 10/06/2013 Elsevier Interactive Patient Education [employer].

## 2015-11-15 DIAGNOSIS — M4712 Other spondylosis with myelopathy, cervical region: Secondary | ICD-10-CM | POA: Diagnosis not present

## 2015-11-15 DIAGNOSIS — I110 Hypertensive heart disease with heart failure: Secondary | ICD-10-CM | POA: Diagnosis not present

## 2015-11-15 DIAGNOSIS — Z4789 Encounter for other orthopedic aftercare: Secondary | ICD-10-CM | POA: Diagnosis not present

## 2015-11-15 DIAGNOSIS — M1990 Unspecified osteoarthritis, unspecified site: Secondary | ICD-10-CM | POA: Diagnosis not present

## 2015-11-15 DIAGNOSIS — E1165 Type 2 diabetes mellitus with hyperglycemia: Secondary | ICD-10-CM | POA: Diagnosis not present

## 2015-11-15 DIAGNOSIS — I5032 Chronic diastolic (congestive) heart failure: Secondary | ICD-10-CM | POA: Diagnosis not present

## 2015-11-18 ENCOUNTER — Ambulatory Visit (INDEPENDENT_AMBULATORY_CARE_PROVIDER_SITE_OTHER): Payer: Medicare Other | Admitting: Podiatry

## 2015-11-18 ENCOUNTER — Encounter: Payer: Self-pay | Admitting: Podiatry

## 2015-11-18 VITALS — BP 137/78 | HR 72

## 2015-11-18 DIAGNOSIS — M659 Synovitis and tenosynovitis, unspecified: Secondary | ICD-10-CM

## 2015-11-18 DIAGNOSIS — E1165 Type 2 diabetes mellitus with hyperglycemia: Secondary | ICD-10-CM | POA: Diagnosis not present

## 2015-11-18 DIAGNOSIS — I110 Hypertensive heart disease with heart failure: Secondary | ICD-10-CM | POA: Diagnosis not present

## 2015-11-18 DIAGNOSIS — Z4789 Encounter for other orthopedic aftercare: Secondary | ICD-10-CM | POA: Diagnosis not present

## 2015-11-18 DIAGNOSIS — M25571 Pain in right ankle and joints of right foot: Secondary | ICD-10-CM | POA: Diagnosis not present

## 2015-11-18 DIAGNOSIS — M1990 Unspecified osteoarthritis, unspecified site: Secondary | ICD-10-CM | POA: Diagnosis not present

## 2015-11-18 DIAGNOSIS — M4712 Other spondylosis with myelopathy, cervical region: Secondary | ICD-10-CM | POA: Diagnosis not present

## 2015-11-18 DIAGNOSIS — I5032 Chronic diastolic (congestive) heart failure: Secondary | ICD-10-CM | POA: Diagnosis not present

## 2015-11-18 MED ORDER — OXYCODONE-ACETAMINOPHEN 10-325 MG PO TABS: 1.0000 | ORAL_TABLET | Freq: Four times a day (QID) | ORAL | Status: DC | PRN

## 2015-11-18 NOTE — Patient Instructions (Signed)
Still under Rehab program from neck surgery. Will monitor and stay at current level of care till done with physical therapy rehab program.  Return as needed.

## 2015-11-18 NOTE — Progress Notes (Signed)
Subjective: 45 year old female presents accompanied by her daughter for follow up on right foot pain. She is having throbbing pain at right ankle and mid foot, and ball of right foot.  She has had neck surgery on 09/16/15 and was discharged on 09/23/15. She was in rehab till 10/22/15.  She is having visiting nurse at this time.  There is no change in her foot condition.   Objective: Dermatologic: All nails are thick and hypertrophic.  Vascular status are within normal. Neurologic: Pain in both dorsum and ankle with ambulation bilateral. Pain extending upper thigh bilateral.  Recently collapsing and tripping down to floor a couple times a day.  Experiencing daily walking difficulties.  Orthopedic: Positive of elevated first ray with forefoot varus right. Tight Achilles tendon bilateral.   Her last X-ray report indicate; Right foot has positive of bone callus around fusion site of 1st MCJ, irregular joint space at Naviculocuneiform joint and ankle joint right foot with elevated first metatarsal bone. Old fusion site at first [place] left appears to be fused with bone callus. Left foot show in lateral view elevated first ray, normal and congruous joint surfaces and spaces at TNJ, and ankle joint.  Impressions: Right foot and ankle pain due to osteoarthropathy of midfoot and ankle joint. Left foot pain from faulty biomechanics.   Assessment: 1. Pain and swelling in foot and ankle radiating to thigh. 2. Difficulty walking and frequent fall.  3. Osteoarthropathy right foot and ankle. 4. Faulty biomechanics bilateral.  5. Ankle Equinus bilateral.  6. Diabetic under control. 7. Weakness left arm. 8. Chronic knee pain. 9. Onychomycosis x 10.  Plan: Continue with current level of care till full recovery from her recent neck surgery.  May take pain medication if needed. Return as needed

## 2015-11-19 ENCOUNTER — Other Ambulatory Visit: Payer: Self-pay | Admitting: Adult Health

## 2015-11-21 ENCOUNTER — Other Ambulatory Visit: Payer: Self-pay | Admitting: Adult Health

## 2015-11-22 ENCOUNTER — Ambulatory Visit (INDEPENDENT_AMBULATORY_CARE_PROVIDER_SITE_OTHER): Payer: Medicare Other | Admitting: Pulmonary Disease

## 2015-11-22 ENCOUNTER — Other Ambulatory Visit: Payer: Self-pay | Admitting: *Deleted

## 2015-11-22 ENCOUNTER — Encounter: Payer: Self-pay | Admitting: Pulmonary Disease

## 2015-11-22 VITALS — BP 144/98 | HR 81 | Ht 66.0 in | Wt 307.6 lb

## 2015-11-22 DIAGNOSIS — I5032 Chronic diastolic (congestive) heart failure: Secondary | ICD-10-CM | POA: Diagnosis not present

## 2015-11-22 DIAGNOSIS — M1990 Unspecified osteoarthritis, unspecified site: Secondary | ICD-10-CM | POA: Diagnosis not present

## 2015-11-22 DIAGNOSIS — Z4789 Encounter for other orthopedic aftercare: Secondary | ICD-10-CM | POA: Diagnosis not present

## 2015-11-22 DIAGNOSIS — G4733 Obstructive sleep apnea (adult) (pediatric): Secondary | ICD-10-CM

## 2015-11-22 DIAGNOSIS — E1165 Type 2 diabetes mellitus with hyperglycemia: Secondary | ICD-10-CM | POA: Diagnosis not present

## 2015-11-22 DIAGNOSIS — E669 Obesity, unspecified: Secondary | ICD-10-CM | POA: Diagnosis not present

## 2015-11-22 DIAGNOSIS — M4712 Other spondylosis with myelopathy, cervical region: Secondary | ICD-10-CM | POA: Diagnosis not present

## 2015-11-22 DIAGNOSIS — J4530 Mild persistent asthma, uncomplicated: Secondary | ICD-10-CM | POA: Diagnosis not present

## 2015-11-22 DIAGNOSIS — I110 Hypertensive heart disease with heart failure: Secondary | ICD-10-CM | POA: Diagnosis not present

## 2015-11-22 MED ORDER — ALBUTEROL SULFATE HFA 108 (90 BASE) MCG/ACT IN AERS: 1.0000 | INHALATION_SPRAY | Freq: Four times a day (QID) | RESPIRATORY_TRACT | Status: DC | PRN

## 2015-11-22 MED ORDER — BUDESONIDE-FORMOTEROL FUMARATE 160-4.5 MCG/ACT IN AERO: 2.0000 | INHALATION_SPRAY | Freq: Two times a day (BID) | RESPIRATORY_TRACT | Status: DC

## 2015-11-22 NOTE — Assessment & Plan Note (Signed)
Referral to nutritionist

## 2015-11-22 NOTE — Progress Notes (Signed)
   Subjective:    Patient ID: Diana Henderson, female    DOB: [birth year], 45 y.o.   MRN: [id number]  HPI  45 year old morbidly obese woman for FU of obstructive sleep apnea. Former [place] patient - has been seen for cough and asthma Cough resolved off ACE. GERD is being treated. Maintained on symbicort for asthma, spirometry nml  PSG 11/2011 showed mild OSA with AHI 14/hour with desaturations to 74% and severe snoring-weight 284 pounds BMI 47. On a subsequent study in 06/2012 CPAP was titrated to 10 cm with no residual events. She was then initiated on CPAP, but apparently has not been compliant she stopped using it in the last 6 months and states that she does not have supplies.  11/22/2015  Chief Complaint  Patient presents with  . Follow-up    Former [place] patient, doing okay on CPAP now.  breathing doing well, but would like to discuss how to lose weight, she states that weight is causing SOB with exertion.    She underwent cervical laminectomy for myelopathy and needed a rehabilitation stay. She states compliance with her nasal CPAP. She denies any problems with mask or pressure She reports increasing dyspnea which she attributes to her weight-she is compliant with her Symbicort and using more of her albuterol  She has been unable to establish with a psychiatrist and unable to get refills on her Risperdal Her pedal edema had improved after Lasix dose on last visit and has now returned Her blood pressure is slightly high today  She wonders if weight loss medications would help her.[email or web address]     Review of Systems neg for any significant sore throat, dysphagia, itching, sneezing, nasal congestion or excess/ purulent secretions, fever, chills, sweats, unintended wt loss, pleuritic or exertional cp, hempoptysis, orthopnea pnd or change in chronic leg swelling. Also denies presyncope, palpitations, heartburn, abdominal pain, nausea, vomiting, diarrhea or change in bowel or urinary habits,  dysuria,hematuria, rash, arthralgias, visual complaints, headache, numbness weakness or ataxia.     Objective:   Physical Exam Gen. Pleasant, obese, in no distress ENT - no lesions, no post nasal drip Neck: No JVD, no thyromegaly, no carotid bruits Lungs: no use of accessory muscles, no dullness to percussion, decreased without rales or rhonchi  Cardiovascular: Rhythm regular, heart sounds  normal, no murmurs or gallops, no peripheral edema Musculoskeletal: No deformities, no cyanosis or clubbing , no tremors        Assessment & Plan:

## 2015-11-22 NOTE — Assessment & Plan Note (Signed)
CPAP supplies will be renewed   Weight loss encouraged, compliance with goal of at least 4-6 hrs every night is the expectation. Advised against medications with sedative side effects Cautioned against driving when sleepy - understanding that sleepiness will vary on a day to day basis

## 2015-11-22 NOTE — Assessment & Plan Note (Signed)
Refills on Symbicort and albuterol  Dyspnea is mostly related to deconditioning based on obesity and weight loss would help

## 2015-11-22 NOTE — Patient Instructions (Addendum)
CPAP supplies will be renewed Refills on Symbicort and albuterol

## 2015-11-24 DIAGNOSIS — E1165 Type 2 diabetes mellitus with hyperglycemia: Secondary | ICD-10-CM | POA: Diagnosis not present

## 2015-11-24 DIAGNOSIS — I110 Hypertensive heart disease with heart failure: Secondary | ICD-10-CM | POA: Diagnosis not present

## 2015-11-24 DIAGNOSIS — M1990 Unspecified osteoarthritis, unspecified site: Secondary | ICD-10-CM | POA: Diagnosis not present

## 2015-11-24 DIAGNOSIS — Z4789 Encounter for other orthopedic aftercare: Secondary | ICD-10-CM | POA: Diagnosis not present

## 2015-11-24 DIAGNOSIS — M4712 Other spondylosis with myelopathy, cervical region: Secondary | ICD-10-CM | POA: Diagnosis not present

## 2015-11-24 DIAGNOSIS — I5032 Chronic diastolic (congestive) heart failure: Secondary | ICD-10-CM | POA: Diagnosis not present

## 2015-11-25 DIAGNOSIS — Z4789 Encounter for other orthopedic aftercare: Secondary | ICD-10-CM | POA: Diagnosis not present

## 2015-11-25 DIAGNOSIS — I110 Hypertensive heart disease with heart failure: Secondary | ICD-10-CM | POA: Diagnosis not present

## 2015-11-25 DIAGNOSIS — E1165 Type 2 diabetes mellitus with hyperglycemia: Secondary | ICD-10-CM | POA: Diagnosis not present

## 2015-11-25 DIAGNOSIS — I5032 Chronic diastolic (congestive) heart failure: Secondary | ICD-10-CM | POA: Diagnosis not present

## 2015-11-25 DIAGNOSIS — M1990 Unspecified osteoarthritis, unspecified site: Secondary | ICD-10-CM | POA: Diagnosis not present

## 2015-11-25 DIAGNOSIS — M4712 Other spondylosis with myelopathy, cervical region: Secondary | ICD-10-CM | POA: Diagnosis not present

## 2015-11-30 ENCOUNTER — Ambulatory Visit (INDEPENDENT_AMBULATORY_CARE_PROVIDER_SITE_OTHER): Payer: Medicare Other | Admitting: Podiatry

## 2015-11-30 ENCOUNTER — Encounter: Payer: Self-pay | Admitting: Podiatry

## 2015-11-30 VITALS — BP 110/73 | HR 69

## 2015-11-30 DIAGNOSIS — E1165 Type 2 diabetes mellitus with hyperglycemia: Secondary | ICD-10-CM | POA: Diagnosis not present

## 2015-11-30 DIAGNOSIS — I110 Hypertensive heart disease with heart failure: Secondary | ICD-10-CM | POA: Diagnosis not present

## 2015-11-30 DIAGNOSIS — I5032 Chronic diastolic (congestive) heart failure: Secondary | ICD-10-CM | POA: Diagnosis not present

## 2015-11-30 DIAGNOSIS — M4712 Other spondylosis with myelopathy, cervical region: Secondary | ICD-10-CM | POA: Diagnosis not present

## 2015-11-30 DIAGNOSIS — M1990 Unspecified osteoarthritis, unspecified site: Secondary | ICD-10-CM | POA: Diagnosis not present

## 2015-11-30 DIAGNOSIS — M659 Synovitis and tenosynovitis, unspecified: Secondary | ICD-10-CM

## 2015-11-30 DIAGNOSIS — M25571 Pain in right ankle and joints of right foot: Secondary | ICD-10-CM

## 2015-11-30 DIAGNOSIS — R262 Difficulty in walking, not elsewhere classified: Secondary | ICD-10-CM | POA: Diagnosis not present

## 2015-11-30 DIAGNOSIS — Z4789 Encounter for other orthopedic aftercare: Secondary | ICD-10-CM | POA: Diagnosis not present

## 2015-11-30 DIAGNOSIS — F332 Major depressive disorder, recurrent severe without psychotic features: Secondary | ICD-10-CM | POA: Diagnosis not present

## 2015-11-30 MED ORDER — OXYCODONE-ACETAMINOPHEN 10-325 MG PO TABS: 1.0000 | ORAL_TABLET | Freq: Four times a day (QID) | ORAL | Status: DC | PRN

## 2015-11-30 NOTE — Patient Instructions (Signed)
Pain medication re ordered as per request. Return as needed.

## 2015-11-30 NOTE — Progress Notes (Signed)
Subjective: 45 year old female presents accompanied by her daughter for follow up on right foot pain. Stated that she is not feeling well.  No change in condition and need more pain medication. She is having throbbing pain at right ankle and mid foot, and ball of right foot.  She has had neck surgery on 09/16/15 and was discharged on 09/23/15.   Objective: Dermatologic: All nails are thick and hypertrophic.  Vascular status are within normal. Neurologic: Pain in both dorsum and ankle with ambulation bilateral. Pain extending upper thigh bilateral.  Recently collapsing and tripping down to floor a couple times a day.  Experiencing daily walking difficulties.  Orthopedic: Positive of elevated first ray with forefoot varus right. Tight Achilles tendon bilateral.   Her previous X-ray report indicate; Right foot has positive of bone callus around fusion site of 1st MCJ, irregular joint space at Naviculocuneiform joint and ankle joint right foot with elevated first metatarsal bone. Old fusion site at first [place] left appears to be fused with bone callus. Left foot show in lateral view elevated first ray, normal and congruous joint surfaces and spaces at TNJ, and ankle joint.  Impressions: Right foot and ankle pain due to osteoarthropathy of midfoot and ankle joint. Left foot pain from faulty biomechanics.   Assessment: 1. Pain and swelling in foot and ankle radiating to thigh. 2. Difficulty walking and frequent fall.  3. Osteoarthropathy right foot and ankle. 4. Faulty biomechanics bilateral.  5. Ankle Equinus bilateral.  6. Diabetic under control. 7. Weakness left arm. 8. Chronic knee pain. 9. Onychomycosis x 10.  Plan: Continue with current level of care till full recovery from her recent neck surgery.  May take pain medication if needed. Return as needed

## 2015-12-01 ENCOUNTER — Encounter (HOSPITAL_COMMUNITY): Payer: Self-pay | Admitting: *Deleted

## 2015-12-01 ENCOUNTER — Emergency Department (HOSPITAL_BASED_OUTPATIENT_CLINIC_OR_DEPARTMENT_OTHER)
Admit: 2015-12-01 | Discharge: 2015-12-01 | Disposition: A | Discharge status: Home / Self Care | Payer: Medicare Other | Attending: Emergency Medicine | Admitting: Emergency Medicine

## 2015-12-01 ENCOUNTER — Emergency Department (HOSPITAL_COMMUNITY): Payer: Medicare Other

## 2015-12-01 ENCOUNTER — Emergency Department (HOSPITAL_COMMUNITY)
Admission: EM | Admit: 2015-12-01 | Discharge: 2015-12-01 | Disposition: A | Discharge status: Home / Self Care | Payer: Medicare Other | Attending: Emergency Medicine | Admitting: Emergency Medicine

## 2015-12-01 DIAGNOSIS — M62830 Muscle spasm of back: Secondary | ICD-10-CM | POA: Diagnosis not present

## 2015-12-01 DIAGNOSIS — Z794 Long term (current) use of insulin: Secondary | ICD-10-CM | POA: Insufficient documentation

## 2015-12-01 DIAGNOSIS — E785 Hyperlipidemia, unspecified: Secondary | ICD-10-CM | POA: Insufficient documentation

## 2015-12-01 DIAGNOSIS — I10 Essential (primary) hypertension: Secondary | ICD-10-CM | POA: Diagnosis not present

## 2015-12-01 DIAGNOSIS — E1165 Type 2 diabetes mellitus with hyperglycemia: Secondary | ICD-10-CM | POA: Diagnosis not present

## 2015-12-01 DIAGNOSIS — Z87891 Personal history of nicotine dependence: Secondary | ICD-10-CM | POA: Insufficient documentation

## 2015-12-01 DIAGNOSIS — M542 Cervicalgia: Secondary | ICD-10-CM | POA: Insufficient documentation

## 2015-12-01 DIAGNOSIS — Z91040 Latex allergy status: Secondary | ICD-10-CM | POA: Diagnosis not present

## 2015-12-01 DIAGNOSIS — M62838 Other muscle spasm: Secondary | ICD-10-CM | POA: Diagnosis not present

## 2015-12-01 DIAGNOSIS — Z79899 Other long term (current) drug therapy: Secondary | ICD-10-CM | POA: Diagnosis not present

## 2015-12-01 DIAGNOSIS — M79609 Pain in unspecified limb: Secondary | ICD-10-CM | POA: Diagnosis not present

## 2015-12-01 DIAGNOSIS — F332 Major depressive disorder, recurrent severe without psychotic features: Secondary | ICD-10-CM | POA: Diagnosis not present

## 2015-12-01 DIAGNOSIS — M25559 Pain in unspecified hip: Secondary | ICD-10-CM

## 2015-12-01 DIAGNOSIS — M25551 Pain in right hip: Secondary | ICD-10-CM | POA: Diagnosis not present

## 2015-12-01 DIAGNOSIS — Z7901 Long term (current) use of anticoagulants: Secondary | ICD-10-CM | POA: Insufficient documentation

## 2015-12-01 DIAGNOSIS — R202 Paresthesia of skin: Secondary | ICD-10-CM | POA: Insufficient documentation

## 2015-12-01 DIAGNOSIS — J45909 Unspecified asthma, uncomplicated: Secondary | ICD-10-CM | POA: Diagnosis not present

## 2015-12-01 DIAGNOSIS — R739 Hyperglycemia, unspecified: Secondary | ICD-10-CM

## 2015-12-01 LAB — CBC
HEMATOCRIT: 40.9 % (ref 36.0–46.0)
HEMOGLOBIN: 12.8 g/dL (ref 12.0–15.0)
MCH: 24.8 pg — ABNORMAL LOW (ref 26.0–34.0)
MCHC: 31.3 g/dL (ref 30.0–36.0)
MCV: 79.1 fL (ref 78.0–100.0)
Platelets: 242 10*3/uL (ref 150–400)
RBC: 5.17 MIL/uL — AB (ref 3.87–5.11)
RDW: 12.8 % (ref 11.5–15.5)
WBC: 4.7 10*3/uL (ref 4.0–10.5)

## 2015-12-01 LAB — CBG MONITORING, ED
GLUCOSE-CAPILLARY: 371 mg/dL — AB (ref 65–99)
GLUCOSE-CAPILLARY: 267 mg/dL — AB (ref 65–99)

## 2015-12-01 LAB — I-STAT BETA HCG BLOOD, ED (MC, WL, AP ONLY)

## 2015-12-01 LAB — BASIC METABOLIC PANEL
ANION GAP: 10 (ref 5–15)
BUN: <5 mg/dL — ABNORMAL LOW (ref 6–20)
CALCIUM: 9.4 mg/dL (ref 8.9–10.3)
CO2: 27 mmol/L (ref 22–32)
Chloride: 95 mmol/L — ABNORMAL LOW (ref 101–111)
Creatinine, Ser: 1.02 mg/dL — ABNORMAL HIGH (ref 0.44–1.00)
GLUCOSE: 382 mg/dL — AB (ref 65–99)
POTASSIUM: 3.9 mmol/L (ref 3.5–5.1)
SODIUM: 132 mmol/L — AB (ref 135–145)

## 2015-12-01 MED ORDER — DIPHENHYDRAMINE HCL 25 MG PO CAPS
25.0000 mg | ORAL_CAPSULE | Freq: Once | ORAL | Status: AC
  Administered 2015-12-01: 25 mg via ORAL
  Filled 2015-12-01: qty 1

## 2015-12-01 MED ORDER — SODIUM CHLORIDE 0.9 % IV BOLUS (SEPSIS)
1000.0000 mL | Freq: Once | INTRAVENOUS | Status: AC
  Administered 2015-12-01: 1000 mL via INTRAVENOUS

## 2015-12-01 MED ORDER — NAPROXEN 500 MG PO TABS: 500.0000 mg | ORAL_TABLET | Freq: Two times a day (BID) | ORAL | Status: DC | PRN

## 2015-12-01 MED ORDER — DIAZEPAM 5 MG PO TABS: 5.0000 mg | ORAL_TABLET | Freq: Four times a day (QID) | ORAL | Status: DC | PRN

## 2015-12-01 MED ORDER — NAPROXEN 250 MG PO TABS
500.0000 mg | ORAL_TABLET | Freq: Once | ORAL | Status: AC
  Administered 2015-12-01: 500 mg via ORAL
  Filled 2015-12-01: qty 2

## 2015-12-01 MED ORDER — HYDROCODONE-ACETAMINOPHEN 5-325 MG PO TABS
1.0000 | ORAL_TABLET | Freq: Once | ORAL | Status: AC
  Administered 2015-12-01: 1 via ORAL
  Filled 2015-12-01: qty 1

## 2015-12-01 MED ORDER — HYDROCODONE-ACETAMINOPHEN 5-325 MG PO TABS: 2.0000 | ORAL_TABLET | ORAL | Status: DC | PRN

## 2015-12-01 MED ORDER — DIAZEPAM 5 MG PO TABS
10.0000 mg | ORAL_TABLET | Freq: Once | ORAL | Status: AC
  Administered 2015-12-01: 10 mg via ORAL
  Filled 2015-12-01: qty 2

## 2015-12-01 NOTE — ED Provider Notes (Signed)
CSN: [id number]     Arrival date & time 12/01/15  [id number] History   First MD Initiated Contact with Patient 12/01/15 [id number]     Chief Complaint  Patient presents with  . Neck Pain  . hyperglycemia      (Consider location/radiation/quality/duration/timing/severity/associated sxs/prior Treatment) HPI 45 year old Female who presents with neck pain and hyperglycemia. She has a history of diabetes on insulin, hypertension, hyperlipidemia, and history of C4-C7 laminectomies with lateral mass fusion on 09/15/2015 by Dr. Kathyrn Sheriff. She has been seen in the ED 2 times in May for persistent neck pain and back pain after surgery with right lower extremity paresthesia and pain. Has had MRI of her cervical spine and lumbar spine 11/06/2015 that was overall unremarkable. Has followed up with her neurosurgeon at the end of the month, and states that she had routine healing and was prescribed a cream for her pain. States that she still has persistent pain at the base of the left side of her neck as well as pain in her right thigh with tingling. These have not changed from when she was seen in the ED in May. She has not had fever, night sweats, nausea or vomiting, chest pain or abdominal pain, urinary retention, bowel incontinence, or saddle anesthesia. No new numbness or weakness. Does note that over the past several months she has had elevated blood sugars in the 300s to 400s. Has had some urinary frequency in the setting.  Past Medical History  Diagnosis Date  . Diabetes mellitus   . Hypertension   . Hyperlipidemia   . Depression   . PTSD (post-traumatic stress disorder)   . Pain in joint, ankle and foot 02/17/2013  . Anxiety   . Bipolar 1 disorder ([place])   . Sleep apnea     uses CPAP intermittently, pt. doesn't remember where she had the study  . Asthma     has been eval. by Dr. Gwenette Greet- last 06/2014  . Pneumonia     hosp. for a couple of day- not sure when it was   . GERD (gastroesophageal reflux disease)    . Headache     sometimes has migraines   . Arthritis     osteoarthritis   . OSA on CPAP   . Knee pain, chronic   . Shortness of breath dyspnea   . Complication of anesthesia     09/13/14: malignant HTN with inducction, case cx but no sourse identified; 11/05/14 remifentanyl infusion used to attenuate hypergynamic induction   Past Surgical History  Procedure Laterality Date  . Tubal ligation    . Midtarsal arthrodesis Right 07/17/12    Rt foot  . Remove implant deep Right 07/17/12    Rt foot  . Lapidus fusion Right 01/10/12    Rt foot  . Carpal tunnel release Left 2001    Left wrist  . Gastroc recession extremity Right 3//20/14    Right foot  . Hernia repair      1996  . Remove implant deep Right 07/22/2014    Rt foot @ [facility]  . Cotton osteotomy w/ bone graft Right 09/30/2014    Rt foot @ [facility]  . Anterior cervical decomp/discectomy fusion N/A 11/05/2014    Procedure: ANTERIOR CERVICAL DECOMPRESSION/DISCECTOMY FUSION 1 LEVEL;  Surgeon: Consuella Lose, MD;  Location: [place] [facility];  Service: Neurosurgery;  Laterality: N/A;  Cervical five- cervical seven anterior cervical decompression with fusion interbody prosthesis plating and bonegraft  . Cervical laminectomy  03/31/20176  CERVICAL 4 SEVEN LAMINECTOMIES WITH LATERAL FUSION  . Posterior cervical fusion/foraminotomy N/A 09/16/2015    Procedure: Cervical four - seven laminectomies with lateral mass fusion;  Surgeon: Consuella Lose, MD;  Location: [place] [facility];  Service: Neurosurgery;  Laterality: N/A;  C4-7 laminectomy with lateral mass fusion   Family History  Problem Relation Age of Onset  . Heart attack Father   . Kidney disease Mother    Social History  Substance Use Topics  . Smoking status: Former Smoker -- 2.00 packs/day for 6 years    Types: Cigarettes    Quit date: 06/18/1978  . Smokeless tobacco: Never Used  . Alcohol Use: 0.0 oz/week    0 Standard drinks or equivalent per week     Comment: weekend- use of beer     OB History    No data available     Review of Systems 10/14 systems reviewed and are negative other than those stated in the HPI    Allergies  Celery oil; Pork-derived products; and Latex  Home Medications   Prior to Admission medications   Medication Sig Start Date End Date Taking? Authorizing Provider  acetaminophen (TYLENOL) 325 MG tablet Take 2 tablets (650 mg total) by mouth every 4 (four) hours as needed (temp > 100.5). 09/22/15  Yes [name], MD  albuterol (PROVENTIL HFA;VENTOLIN HFA) 108 (90 Base) MCG/ACT inhaler Inhale 1-2 puffs into the lungs every 6 (six) hours as needed for wheezing or shortness of breath. 11/22/15  Yes Rigoberto Noel, MD  atenolol (TENORMIN) 50 MG tablet Take 1 tablet (50 mg total) by mouth daily. 09/10/14  Yes Eugenie Filler, MD  budesonide-formoterol [facility]) 160-4.5 MCG/ACT inhaler Inhale 2 puffs into the lungs 2 (two) times daily. 11/22/15  Yes Rigoberto Noel, MD  DULoxetine (CYMBALTA) 60 MG capsule Take 60 mg by mouth at bedtime.    Yes Historical Provider, MD  fluticasone (FLONASE) 50 MCG/ACT nasal spray Place 1 spray into both nostrils 2 (two) times daily. Patient taking differently: Place 1 spray into both nostrils daily as needed for allergies.  04/07/15  Yes Nona Dell, PA-C  gabapentin (NEURONTIN) 600 MG tablet Take 1 tablet (600 mg total) by mouth daily. Patient taking differently: Take 600 mg by mouth 2 (two) times daily.  09/23/15  Yes [name], MD  HYDROcodone-acetaminophen (NORCO/VICODIN) 5-325 MG tablet Take 1 tablet by mouth every 6 (six) hours as needed. 11/12/15  Yes Varney Biles, MD  insulin NPH-regular Human (NOVOLIN 70/30) (70-30) 100 UNIT/ML injection Inject 30 Units into the skin 2 (two) times daily with a meal.   Yes Historical Provider, MD  lactulose (CHRONULAC) 10 GM/15ML solution Take 45 mLs (30 g total) by mouth 2 (two) times daily. 09/22/15  Yes [name], MD  linagliptin (TRADJENTA) 5 MG TABS tablet  Take 1 tablet (5 mg total) by mouth daily with breakfast. 09/22/15  Yes [name], MD  nabumetone (RELAFEN) 500 MG tablet Take 1 tablet (500 mg total) by mouth 2 (two) times daily. 07/25/15  Yes Myeong O Sheard, DPM  omeprazole (PRILOSEC) 20 MG capsule Take 20 mg by mouth daily.    Yes Historical Provider, MD  ondansetron (ZOFRAN ODT) 8 MG disintegrating tablet Take 1 tablet (8 mg total) by mouth every 8 (eight) hours as needed for nausea or vomiting. 01/28/14  Yes Sherwood Gambler, MD  oxyCODONE-acetaminophen (PERCOCET) 10-325 MG tablet Take 1 tablet by mouth every 6 (six) hours as needed for pain. 11/30/15  Yes Myeong O  Sheard, DPM  polyethylene glycol (MIRALAX / GLYCOLAX) packet Take 17 g by mouth daily as needed for mild constipation. 09/22/15  Yes [name], MD  Psyllium (METAMUCIL PO) Take 1 scoop by mouth daily as needed (for constipation).    Yes Historical Provider, MD  tamsulosin (FLOMAX) 0.4 MG CAPS capsule Take 1 capsule (0.4 mg total) by mouth daily. 09/23/15  Yes Consuella Lose, MD  tiZANidine (ZANAFLEX) 4 MG tablet Take 1 tablet (4 mg total) by mouth every 8 (eight) hours as needed for muscle spasms. 09/10/14  Yes Eugenie Filler, MD  trimethoprim-polymyxin b (POLYTRIM) ophthalmic solution Place 1 drop into the left eye every 4 (four) hours. Patient taking differently: Place 1 drop into the left eye every 4 (four) hours as needed (irratation).  02/19/14  Yes Donne Hazel, MD   BP 143/108 mmHg  Pulse 95  Temp(Src) 98.2 F (36.8 C) (Oral)  Resp 18  Ht 5\' 6"  (1.676 m)  Wt 308 lb (139.708 kg)  BMI 49.74 kg/m2  SpO2 97%  LMP 10/13/2015 Physical Exam Physical Exam  Nursing note and vitals reviewed. Constitutional: Looks tired, non-toxic, and in no acute distress Head: Normocephalic and atraumatic.  Mouth/Throat: Oropharynx is clear and dry.  Neck: Normal range of motion. Neck supple.  midline surgical scar over cervical spine, intact without surrounding erythema. There is mild  swelling and tenderness over the left trapezius muscle, Cardiovascular: Normal rate and regular rhythm.   Pulmonary/Chest: Effort normal and breath sounds normal.  Abdominal: Soft. There is no tenderness. There is no rebound and no guarding.  Musculoskeletal: No deformities. There is tenderness over the right gluteal and right quad muscles. No notable swelling Neurological: Alert, no facial droop, fluent speech, sensation slightly diminished over the right thigh. Sensation intact in bilateral lower legs. Full strength ankle dorsi and plantar flexion, knee flexion and extension, hip flexion and extension. Equal bilateral hand grips and no pronator drift. Sensation to light touch intact in bilateral upper extremities Skin: Skin is warm and dry.  Psychiatric: Cooperative  ED Course  Procedures (including critical care time) Labs Review Labs Reviewed  BASIC METABOLIC PANEL - Abnormal; Notable for the following:    Sodium 132 (*)    Chloride 95 (*)    Glucose, Bld 382 (*)    BUN <5 (*)    Creatinine, Ser 1.02 (*)    All other components within normal limits  CBC - Abnormal; Notable for the following:    RBC 5.17 (*)    MCH 24.8 (*)    All other components within normal limits  CBG MONITORING, ED - Abnormal; Notable for the following:    Glucose-Capillary 371 (*)    All other components within normal limits  CBG MONITORING, ED - Abnormal; Notable for the following:    Glucose-Capillary 267 (*)    All other components within normal limits  URINALYSIS, ROUTINE W REFLEX MICROSCOPIC (NOT AT [facility])  I-STAT BETA HCG BLOOD, ED ([facility], [facility], [facility] ONLY)    Imaging Review Dg Hip Unilat With Pelvis 2-3 Views Right  12/01/2015  CLINICAL DATA:  Pain for several days, lateral hip. No known injury. EXAM: DG HIP (WITH OR WITHOUT PELVIS) 2-3V RIGHT COMPARISON:  None. FINDINGS: Single view of the pelvis and two views of the right hip are provided. Osseous alignment is normal. Bone mineralization is normal. No  fracture line or displaced fracture fragment seen. No acute or suspicious osseous lesion. Soft tissues about the pelvis and right hip are unremarkable.  IMPRESSION: Negative. Electronically Signed   By: Franki Cabot M.D.   On: 12/01/2015 08:15   I have personally reviewed and evaluated these images and lab results as part of my medical decision-making.   EKG Interpretation None      MDM   Final diagnoses:  Hip pain  Right leg paresthesias  Hyperglycemia  Muscle spasm    Presenting with multiple complaints including hyperglycemia, right leg pain, and left neck pain. These appear to be chronic in nature. She has had recent evaluation of this right leg pain, right-sided numbness, and neck pain in the ED including MRI of the cervical and lumbar spine. Her symptoms are unchanged from her postop period, and she has since then followed with her neurosurgeon regarding this. She is neurologically intact. She has reproducible tenderness with palpation of the left trapezius muscle and right gluteal/quad muscles likely due to spasms. She has stable tingling and diminished sensation over her right thigh that is unchanged. I did perform x-ray of her right hip as well as ultrasound of her right leg. No abnormalities on her x-ray and no DVT on ultrasound. No new symptoms of neurological complaints, no infectious symptoms, at this time I do not feel that she requires any repeat imaging of her neck or back. I did give Naprosyn, Vicodin, and Valium for symptomatic control while in the emergency department. She feels better. She also has had like hyperglycemia documented between 304 100 at home since March 2017. She has not followed up with her primary care doctor about this. She has had some steroids in the interim for her back pain which might be worsening her glucose. She is hyperglycemic with a point-of-care glucose in the 300s. No evidence of DKA or other complications. Received IV fluids. She will follow-up with  her primary care doctor in 1-2 days to discuss medication readjustments as needed. At this time I do not feel that there is an emergent condition requiring further intervention. She'll continue to follow-up with her neurosurgeon and primary care doctor. Strict return and follow-up instructions reviewed. He expressed understanding of all discharge instructions and felt comfortable with the plan of care.     Forde Dandy, MD 12/01/15 1059

## 2015-12-01 NOTE — Progress Notes (Signed)
*  Preliminary Results* Right lower extremity venous duplex completed. Study was technically difficult and limited due to patient body habitus. Visualized veins of the right lower extremity are negative for deep vein thrombosis. There is no evidence of right Baker's cyst.  12/01/2015 9:06 AM  Maudry Mayhew, RVT, RDCS, RDMS

## 2015-12-01 NOTE — ED Notes (Signed)
C/o neck pain since she had surgery in march  She also has had trouble controlling her blood sugar

## 2015-12-01 NOTE — Discharge Instructions (Signed)
Please call your primary care provider regarding changes to your insulin regimen. Return without fail for worsening symptoms, including inability to walk, new numbness/weakness, fever, or any other symptoms concerning to you.  Hyperglycemia High blood sugar (hyperglycemia) means that the level of sugar in your blood is higher than it should be. Signs of high blood sugar include:  Feeling thirsty.  Frequent peeing (urinating).  Feeling tired or sleepy.  Dry mouth.  Vision changes.  Feeling weak.  Feeling hungry but losing weight.  Numbness and tingling in your hands or feet.  Headache. When you ignore these signs, your blood sugar may keep going up. These problems may get worse, and other problems may begin. HOME CARE  Check your blood sugars as told by your doctor. Write down the numbers with the date and time.  Take the right amount of insulin or diabetes pills at the right time. Write down the dose with date and time.  Refill your insulin or diabetes pills before running out.  Watch what you eat. Follow your meal plan.  Drink liquids without sugar, such as water. Check with your doctor if you have kidney or heart disease.  Follow your doctor's orders for exercise. Exercise at the same time of day.  Keep your doctor's appointments. GET HELP RIGHT AWAY IF:   You have trouble thinking or are confused.  You have fast breathing with fruity smelling breath.  You pass out (faint).  You have 2 to 3 days of high blood sugars and you do not know why.  You have chest pain.  You are feeling sick to your stomach (nauseous) or throwing up (vomiting).  You have sudden vision changes. MAKE SURE YOU:   Understand these instructions.  Will watch your condition.  Will get help right away if you are not doing well or get worse.   This information is not intended to replace advice given to you by your health care provider. Make sure you discuss any questions you have with  your health care provider.   Document Released: 04/01/2009 Document Revised: 06/25/2014 Document Reviewed: 02/08/2015 Elsevier Interactive Patient Education 2016 [place] therapy can help ease sore, stiff, injured, and tight muscles and joints. Heat relaxes your muscles, which may help ease your pain. Heat therapy should only be used on old, pre-existing, or long-lasting (chronic) injuries. Do not use heat therapy unless told by your doctor. HOW TO USE HEAT THERAPY There are several different kinds of heat therapy, including:  Moist heat pack.  Warm water bath.  Hot water bottle.  Electric heating pad.  Heated gel pack.  Heated wrap.  Electric heating pad. GENERAL HEAT THERAPY RECOMMENDATIONS   Do not sleep while using heat therapy. Only use heat therapy while you are awake.  Your skin may turn pink while using heat therapy. Do not use heat therapy if your skin turns red.  Do not use heat therapy if you have new pain.  High heat or long exposure to heat can cause burns. Be careful when using heat therapy to avoid burning your skin.  Do not use heat therapy on areas of your skin that are already irritated, such as with a rash or sunburn. GET HELP IF:   You have blisters, redness, swelling (puffiness), or numbness.  You have new pain.  Your pain is worse. MAKE SURE YOU:  Understand these instructions.  Will watch your condition.  Will get help right away if you are not doing well or  get worse.   This information is not intended to replace advice given to you by your health care provider. Make sure you discuss any questions you have with your health care provider.   Document Released: 08/27/2011 Document Revised: 06/25/2014 Document Reviewed: 07/28/2013 Elsevier Interactive Patient Education 2016 Elsevier Inc[employer]  Muscle Cramps and Spasms Muscle cramps and spasms are when muscles tighten by themselves. They usually get better within minutes.  Muscle cramps are painful. They are usually stronger and last longer than muscle spasms. Muscle spasms may or may not be painful. They can last a few seconds or much longer. HOME CARE  Drink enough fluid to keep your pee (urine) clear or pale yellow.  Massage, stretch, and relax the muscle.  Use a warm towel, heating pad, or warm shower water on tight muscles.  Place ice on the muscle if it is tender or in pain.  Put ice in a plastic bag.  Place a towel between your skin and the bag.  Leave the ice on for 15-20 minutes, 03-04 times a day.  Only take medicine as told by your doctor. GET HELP RIGHT AWAY IF:  Your cramps or spasms get worse, happen more often, or do not get better with time. MAKE SURE YOU:  Understand these instructions.  Will watch your condition.  Will get help right away if you are not doing well or get worse.   This information is not intended to replace advice given to you by your health care provider. Make sure you discuss any questions you have with your health care provider.   Document Released: 05/17/2008 Document Revised: 09/29/2012 Document Reviewed: 05/21/2012 Elsevier Interactive Patient Education 2016 Elsevier Inc.  Paresthesia Paresthesia is a burning or prickling feeling. This feeling can happen in any part of the body. It often happens in the hands, arms, legs, or feet. Usually, it is not painful. In most cases, the feeling goes away in a short time and is not a sign of a serious problem. HOME CARE  Avoid drinking alcohol.  Try massage or needle therapy (acupuncture) to help with your problems.  Keep all follow-up visits as told by your doctor. This is important. GET HELP IF:  You keep on having episodes of paresthesia.  Your burning or prickling feeling gets worse when you walk.  You have pain or cramps.  You feel dizzy.  You have a rash. GET HELP RIGHT AWAY IF:  You feel weak.  You have trouble walking or moving.  You have  problems speaking, understanding, or seeing.  You feel confused.  You cannot control when you pee (urinate) or poop (bowel movement).  You lose feeling (numbness) after an injury.  You pass out (faint).   This information is not intended to replace advice given to you by your health care provider. Make sure you discuss any questions you have with your health care provider.   Document Released: 05/17/2008 Document Revised: 10/19/2014 Document Reviewed: 05/31/2014 Elsevier Interactive Patient Education [employer].

## 2015-12-02 DIAGNOSIS — G4733 Obstructive sleep apnea (adult) (pediatric): Secondary | ICD-10-CM | POA: Diagnosis not present

## 2015-12-02 DIAGNOSIS — M19079 Primary osteoarthritis, unspecified ankle and foot: Secondary | ICD-10-CM | POA: Diagnosis not present

## 2015-12-02 DIAGNOSIS — E119 Type 2 diabetes mellitus without complications: Secondary | ICD-10-CM | POA: Diagnosis not present

## 2015-12-02 DIAGNOSIS — I119 Hypertensive heart disease without heart failure: Secondary | ICD-10-CM | POA: Diagnosis not present

## 2015-12-02 DIAGNOSIS — J45909 Unspecified asthma, uncomplicated: Secondary | ICD-10-CM | POA: Diagnosis not present

## 2015-12-02 DIAGNOSIS — E669 Obesity, unspecified: Secondary | ICD-10-CM | POA: Diagnosis not present

## 2015-12-02 DIAGNOSIS — G629 Polyneuropathy, unspecified: Secondary | ICD-10-CM | POA: Diagnosis not present

## 2015-12-02 DIAGNOSIS — I10 Essential (primary) hypertension: Secondary | ICD-10-CM | POA: Diagnosis not present

## 2015-12-14 DIAGNOSIS — I10 Essential (primary) hypertension: Secondary | ICD-10-CM | POA: Diagnosis not present

## 2015-12-14 DIAGNOSIS — E119 Type 2 diabetes mellitus without complications: Secondary | ICD-10-CM | POA: Diagnosis not present

## 2015-12-14 DIAGNOSIS — J45909 Unspecified asthma, uncomplicated: Secondary | ICD-10-CM | POA: Diagnosis not present

## 2015-12-14 DIAGNOSIS — G629 Polyneuropathy, unspecified: Secondary | ICD-10-CM | POA: Diagnosis not present

## 2015-12-14 DIAGNOSIS — G4733 Obstructive sleep apnea (adult) (pediatric): Secondary | ICD-10-CM | POA: Diagnosis not present

## 2015-12-14 DIAGNOSIS — E669 Obesity, unspecified: Secondary | ICD-10-CM | POA: Diagnosis not present

## 2015-12-14 DIAGNOSIS — I119 Hypertensive heart disease without heart failure: Secondary | ICD-10-CM | POA: Diagnosis not present

## 2015-12-14 DIAGNOSIS — M19079 Primary osteoarthritis, unspecified ankle and foot: Secondary | ICD-10-CM | POA: Diagnosis not present

## 2015-12-15 ENCOUNTER — Ambulatory Visit (INDEPENDENT_AMBULATORY_CARE_PROVIDER_SITE_OTHER): Payer: Medicare Other | Admitting: Podiatry

## 2015-12-15 ENCOUNTER — Encounter: Payer: Self-pay | Admitting: Podiatry

## 2015-12-15 VITALS — BP 164/105 | HR 94

## 2015-12-15 DIAGNOSIS — M659 Synovitis and tenosynovitis, unspecified: Secondary | ICD-10-CM

## 2015-12-15 DIAGNOSIS — M25571 Pain in right ankle and joints of right foot: Secondary | ICD-10-CM | POA: Diagnosis not present

## 2015-12-15 DIAGNOSIS — M21961 Unspecified acquired deformity of right lower leg: Secondary | ICD-10-CM

## 2015-12-15 DIAGNOSIS — R262 Difficulty in walking, not elsewhere classified: Secondary | ICD-10-CM | POA: Diagnosis not present

## 2015-12-15 DIAGNOSIS — M65979 Unspecified synovitis and tenosynovitis, unspecified ankle and foot: Secondary | ICD-10-CM

## 2015-12-15 MED ORDER — OXYCODONE-ACETAMINOPHEN 10-325 MG PO TABS: 1.0000 | ORAL_TABLET | Freq: Four times a day (QID) | ORAL | Status: DC | PRN

## 2015-12-15 NOTE — Progress Notes (Signed)
Subjective: 45 year old female presents accompanied by her daughter for follow up on right foot pain. Stated that she has been discharged from rehab following neck surgery. Patient request to have the right foot fixed.  She is having throbbing pain at right ankle and mid foot, and ball of right foot.  She has had neck surgery on 09/16/15 and was discharged on 09/23/15.   Objective: Dermatologic: All nails are thick and hypertrophic.  Vascular status are within normal. Neurologic: Pain in both dorsum and ankle with ambulation bilateral. Pain extending upper thigh bilateral.  Recently collapsing and tripping down to floor a couple times a day.  Experiencing daily walking difficulties.  Orthopedic: Positive of elevated first ray with forefoot varus right. Tight Achilles tendon bilateral.   Her previous X-ray report indicate; Right foot has positive of bone callus around fusion site of 1st MCJ, irregular joint space at Naviculocuneiform joint and ankle joint right foot with elevated first metatarsal bone. Old fusion site at first [place] left appears to be fused with bone callus. Left foot show in lateral view elevated first ray, normal and congruous joint surfaces and spaces at TNJ, and ankle joint.  Impressions: Right foot and ankle pain due to osteoarthropathy of midfoot and ankle joint. Left foot pain from faulty biomechanics.   Assessment: 1. Pain and swelling in foot and ankle radiating to thigh. 2. Difficulty walking and frequent fall.  3. Osteoarthropathy right foot and ankle. 4. Faulty biomechanics bilateral.  5. Ankle Equinus bilateral.  6. Diabetic under control. 7. Weakness left arm. 8. Chronic knee pain. 9. Onychomycosis x 10.  Plan: Reviewed clinical findings and available treatment options. Patient elects to have surgical options. No guarantee of success explained. ORIF of the first MCJ area with possible tarsal bone fusion explained.

## 2015-12-15 NOTE — Patient Instructions (Signed)
Pain in right foot. Discussed possible option. Patient elected to have surgical option.

## 2015-12-27 ENCOUNTER — Encounter: Payer: Self-pay | Admitting: Skilled Nursing Facility1

## 2015-12-27 ENCOUNTER — Encounter: Payer: Medicare Other | Attending: Internal Medicine | Admitting: Skilled Nursing Facility1

## 2015-12-27 VITALS — Ht 66.0 in | Wt 299.0 lb

## 2015-12-27 DIAGNOSIS — F332 Major depressive disorder, recurrent severe without psychotic features: Secondary | ICD-10-CM | POA: Diagnosis not present

## 2015-12-27 DIAGNOSIS — E669 Obesity, unspecified: Secondary | ICD-10-CM | POA: Insufficient documentation

## 2015-12-27 DIAGNOSIS — E119 Type 2 diabetes mellitus without complications: Secondary | ICD-10-CM | POA: Diagnosis not present

## 2015-12-27 NOTE — Progress Notes (Signed)
Diabetes Self-Management Education  Visit Type: First/Initial  Appt. Start Time: 4:00 Appt. End Time: 5:30  12/27/2015  [name], identified by name and date of birth, is a 45 y.o. female with a diagnosis of Diabetes: Type 2.   ASSESSMENT  Height 5\' 6"  (1.676 m), weight 299 lb (135.626 kg). Body mass index is 48.28 kg/(m^2). Pt states she has not had an appetite for a few months. Pt states she may be interested in an insulin pump. Pt states she has lost about 8 pounds since June 2017. Pt states her vision as been blurred for a few years. Pt states she is up most of the night urinating. Pt states she does not sleep at night she spends her days sleeping which is why she does not eat. Pt states she Has not worne her C-PAP and does not see the need to-Dietitian educated the pt on sleep apnea and the importance of wearing the C-PAP. Pt arrived with her daughter who also struggles with glucose control. Pt states she is interested in bariatric surgery.      Diabetes Self-Management Education - 12/27/15 1624    Visit Information   Visit Type First/Initial   Initial Visit   Diabetes Type Type 2   Are you currently following a meal plan? No   Date Diagnosed 2009   Health Coping   How would you rate your overall health? Fair   Psychosocial Assessment   Patient Belief/Attitude about Diabetes Motivated to manage diabetes   Self-care barriers Impaired vision;Debilitated state due to current medical condition;Unsteady gait/risk for falls   Self-management support Family   Other persons present Family Member   Patient Concerns Nutrition/Meal planning;Glycemic Control   Special Needs Large print;Simplified materials   Pre-Education Assessment   Patient understands the diabetes disease and treatment process. Needs Instruction   Patient understands incorporating nutritional management into lifestyle. Needs Instruction   Patient undertands incorporating physical activity into  lifestyle. Needs Instruction   Patient understands using medications safely. Needs Instruction   Patient understands monitoring blood glucose, interpreting and using results Needs Instruction   Patient understands prevention, detection, and treatment of acute complications. Needs Instruction   Patient understands prevention, detection, and treatment of chronic complications. Needs Instruction   Patient understands how to develop strategies to address psychosocial issues. Needs Instruction   Patient understands how to develop strategies to promote health/change behavior. Needs Instruction   Complications   Last HgB A1C per patient/outside source 11.1 %   How often do you check your blood sugar? 1-2 times/day   Fasting Blood glucose range (mg/dL) >200   Postprandial Blood glucose range (mg/dL) >200   Number of hyperglycemic episodes per week 7   Can you tell when your blood sugar is high? Yes   What do you do if your blood sugar is high? nothing   Have you had a dilated eye exam in the past 12 months? Yes   Have you had a dental exam in the past 12 months? No   Are you checking your feet? Yes   How many days per week are you checking your feet? 7   Dietary Intake   Breakfast none   Lunch none   Exercise   Exercise Type ADL's   Patient Education   Previous Diabetes Education No   Nutrition management  Role of diet in the treatment of diabetes and the relationship between the three main macronutrients and blood glucose level;Food label reading, portion sizes and measuring food.;Carbohydrate counting;Reviewed  blood glucose goals for pre and post meals and how to evaluate the patients' food intake on their blood glucose level.;Information on hints to eating out and maintain blood glucose control.;Meal options for control of blood glucose level and chronic complications.   Physical activity and exercise  Role of exercise on diabetes management, blood pressure control and cardiac health.;Helped  patient identify appropriate exercises in relation to his/her diabetes, diabetes complications and other health issue.   Monitoring Taught/evaluated SMBG meter.;Purpose and frequency of SMBG.;Yearly dilated eye exam;Daily foot exams;Identified appropriate SMBG and/or A1C goals.   Acute complications Discussed and identified patients' treatment of hyperglycemia.   Chronic complications Relationship between chronic complications and blood glucose control;Assessed and discussed foot care and prevention of foot problems;Lipid levels, blood glucose control and heart disease;Identified and discussed with patient  current chronic complications;Retinopathy and reason for yearly dilated eye exams;Nephropathy, what it is, prevention of, the use of ACE, ARB's and early detection of through urine microalbumia.;Reviewed with patient heart disease, higher risk of, and prevention   Individualized Goals (developed by patient)   Nutrition Follow meal plan discussed;General guidelines for healthy choices and portions discussed   Physical Activity Exercise 1-2 times per week;15 minutes per day   Monitoring  test my blood glucose as discussed;test blood glucose pre and post meals as discussed   Post-Education Assessment   Patient understands the diabetes disease and treatment process. Demonstrates understanding / competency   Patient understands incorporating nutritional management into lifestyle. Demonstrates understanding / competency   Patient undertands incorporating physical activity into lifestyle. Demonstrates understanding / competency   Patient understands using medications safely. Demonstrates understanding / competency   Patient understands monitoring blood glucose, interpreting and using results Demonstrates understanding / competency   Patient understands prevention, detection, and treatment of acute complications. Demonstrates understanding / competency   Patient understands prevention, detection, and  treatment of chronic complications. Demonstrates understanding / competency   Patient understands how to develop strategies to address psychosocial issues. Demonstrates understanding / competency   Patient understands how to develop strategies to promote health/change behavior. Demonstrates understanding / competency   Outcomes   Expected Outcomes Demonstrated interest in learning. Expect positive outcomes   Future DMSE 4-6 wks   Program Status Completed      Individualized Plan for Diabetes Self-Management Training:   Learning Objective:  Patient will have a greater understanding of diabetes self-management. Patient education plan is to attend individual and/or group sessions per assessed needs and concerns.   Plan:   Patient Instructions  -Talk to your physician about Bariatric Surgery -Wear your C-PAP every night! -Eat every 2-3 hours (either a snack or a meal)   Expected Outcomes:  Demonstrated interest in learning. Expect positive outcomes  Education material provided: Living Well with Diabetes, Meal plan card, My Plate and Snack sheet  If problems or questions, patient to contact team via:  Phone  Future DSME appointment: 4-6 wks

## 2015-12-27 NOTE — Patient Instructions (Signed)
-  Talk to your physician about Bariatric Surgery -Wear your C-PAP every night! -Eat every 2-3 hours (either a snack or a meal)

## 2015-12-28 ENCOUNTER — Emergency Department (HOSPITAL_COMMUNITY): Payer: Medicare Other

## 2015-12-28 ENCOUNTER — Encounter: Payer: Self-pay | Admitting: Podiatry

## 2015-12-28 ENCOUNTER — Ambulatory Visit (INDEPENDENT_AMBULATORY_CARE_PROVIDER_SITE_OTHER): Payer: Medicare Other | Admitting: Podiatry

## 2015-12-28 ENCOUNTER — Emergency Department (HOSPITAL_COMMUNITY)
Admission: EM | Admit: 2015-12-28 | Discharge: 2015-12-28 | Disposition: A | Discharge status: Home / Self Care | Payer: Medicare Other | Attending: Emergency Medicine | Admitting: Emergency Medicine

## 2015-12-28 ENCOUNTER — Encounter (HOSPITAL_COMMUNITY): Payer: Self-pay | Admitting: *Deleted

## 2015-12-28 DIAGNOSIS — Z91040 Latex allergy status: Secondary | ICD-10-CM | POA: Diagnosis not present

## 2015-12-28 DIAGNOSIS — R262 Difficulty in walking, not elsewhere classified: Secondary | ICD-10-CM | POA: Diagnosis not present

## 2015-12-28 DIAGNOSIS — X503XXA Overexertion from repetitive movements, initial encounter: Secondary | ICD-10-CM | POA: Diagnosis not present

## 2015-12-28 DIAGNOSIS — M25571 Pain in right ankle and joints of right foot: Secondary | ICD-10-CM | POA: Diagnosis not present

## 2015-12-28 DIAGNOSIS — I10 Essential (primary) hypertension: Secondary | ICD-10-CM | POA: Insufficient documentation

## 2015-12-28 DIAGNOSIS — Y92830 Public park as the place of occurrence of the external cause: Secondary | ICD-10-CM | POA: Insufficient documentation

## 2015-12-28 DIAGNOSIS — Z794 Long term (current) use of insulin: Secondary | ICD-10-CM | POA: Insufficient documentation

## 2015-12-28 DIAGNOSIS — Y999 Unspecified external cause status: Secondary | ICD-10-CM | POA: Diagnosis not present

## 2015-12-28 DIAGNOSIS — Z7984 Long term (current) use of oral hypoglycemic drugs: Secondary | ICD-10-CM | POA: Diagnosis not present

## 2015-12-28 DIAGNOSIS — Z79899 Other long term (current) drug therapy: Secondary | ICD-10-CM | POA: Diagnosis not present

## 2015-12-28 DIAGNOSIS — M79672 Pain in left foot: Secondary | ICD-10-CM | POA: Diagnosis not present

## 2015-12-28 DIAGNOSIS — Y9301 Activity, walking, marching and hiking: Secondary | ICD-10-CM | POA: Insufficient documentation

## 2015-12-28 DIAGNOSIS — S9032XA Contusion of left foot, initial encounter: Secondary | ICD-10-CM | POA: Insufficient documentation

## 2015-12-28 DIAGNOSIS — S99922A Unspecified injury of left foot, initial encounter: Secondary | ICD-10-CM | POA: Diagnosis present

## 2015-12-28 DIAGNOSIS — Z87891 Personal history of nicotine dependence: Secondary | ICD-10-CM | POA: Diagnosis not present

## 2015-12-28 DIAGNOSIS — M659 Synovitis and tenosynovitis, unspecified: Secondary | ICD-10-CM | POA: Diagnosis not present

## 2015-12-28 DIAGNOSIS — E119 Type 2 diabetes mellitus without complications: Secondary | ICD-10-CM | POA: Diagnosis not present

## 2015-12-28 DIAGNOSIS — J45909 Unspecified asthma, uncomplicated: Secondary | ICD-10-CM | POA: Insufficient documentation

## 2015-12-28 MED ORDER — OXYCODONE-ACETAMINOPHEN 10-325 MG PO TABS: 1.0000 | ORAL_TABLET | Freq: Four times a day (QID) | ORAL | Status: DC | PRN

## 2015-12-28 MED ORDER — NAPROXEN 375 MG PO TABS: 375.0000 mg | ORAL_TABLET | Freq: Two times a day (BID) | ORAL | Status: DC

## 2015-12-28 MED ORDER — NAPROXEN 250 MG PO TABS
500.0000 mg | ORAL_TABLET | Freq: Once | ORAL | Status: AC
  Administered 2015-12-28: 500 mg via ORAL
  Filled 2015-12-28: qty 2

## 2015-12-28 NOTE — Discharge Instructions (Signed)

## 2015-12-28 NOTE — ED Provider Notes (Signed)
CSN: [id number]     Arrival date & time 12/28/15  0025 History   First MD Initiated Contact with Patient 12/28/15 0114     Chief Complaint  Patient presents with  . Foot Pain    left     (Consider location/radiation/quality/duration/timing/severity/associated sxs/prior Treatment) HPI   Patient has past medical history of diabetes, hypertension, depression, PTSD, anxiety, bipolar, pneumonia who has chronic problems the right foot requiring surgery comes in with complaints of pain to the left foot. She states that she has been walking more than normal [place] in with her family and believes that she has been compensating the left foot. She typically uses a cane to walk and is able to get around but her heel has been causing her pain. She has not looked at the bottom of her foot nor has she tried taking anything for pain.   ROS: The patient denies confusion, diaphoresis, abdominal pains, N/V/D, gas, dysuria, abnormal  bleeding, genital discharge, fever, headache, weakness (general or focal), confusion, change of vision,  dysphagia, aphagia, shortness of breath, lower extremity swelling, rash, neck pain, chest pain, shortness of breath,  back pain.   Past Medical History  Diagnosis Date  . Diabetes mellitus   . Hypertension   . Hyperlipidemia   . Depression   . PTSD (post-traumatic stress disorder)   . Pain in joint, ankle and foot 02/17/2013  . Anxiety   . Bipolar 1 disorder ([place])   . Sleep apnea     uses CPAP intermittently, pt. doesn't remember where she had the study  . Asthma     has been eval. by Dr. Gwenette Greet- last 06/2014  . Pneumonia     hosp. for a couple of day- not sure when it was   . GERD (gastroesophageal reflux disease)   . Headache     sometimes has migraines   . Arthritis     osteoarthritis   . OSA on CPAP   . Knee pain, chronic   . Shortness of breath dyspnea   . Complication of anesthesia     09/13/14: malignant HTN with inducction, case cx but no sourse  identified; 11/05/14 remifentanyl infusion used to attenuate hypergynamic induction   Past Surgical History  Procedure Laterality Date  . Tubal ligation    . Midtarsal arthrodesis Right 07/17/12    Rt foot  . Remove implant deep Right 07/17/12    Rt foot  . Lapidus fusion Right 01/10/12    Rt foot  . Carpal tunnel release Left 2001    Left wrist  . Gastroc recession extremity Right 3//20/14    Right foot  . Hernia repair      1996  . Remove implant deep Right 07/22/2014    Rt foot @ [facility]  . Cotton osteotomy w/ bone graft Right 09/30/2014    Rt foot @ [facility]  . Anterior cervical decomp/discectomy fusion N/A 11/05/2014    Procedure: ANTERIOR CERVICAL DECOMPRESSION/DISCECTOMY FUSION 1 LEVEL;  Surgeon: Consuella Lose, MD;  Location: [facility];  Service: Neurosurgery;  Laterality: N/A;  Cervical five- cervical seven anterior cervical decompression with fusion interbody prosthesis plating and bonegraft  . Cervical laminectomy  03/31/20176    CERVICAL 4 SEVEN LAMINECTOMIES WITH LATERAL FUSION  . Posterior cervical fusion/foraminotomy N/A 09/16/2015    Procedure: Cervical four - seven laminectomies with lateral mass fusion;  Surgeon: Consuella Lose, MD;  Location: [facility];  Service: Neurosurgery;  Laterality: N/A;  C4-7 laminectomy with lateral mass fusion  Family History  Problem Relation Age of Onset  . Heart attack Father   . Kidney disease Mother    Social History  Substance Use Topics  . Smoking status: Former Smoker -- 2.00 packs/day for 6 years    Types: Cigarettes    Quit date: 06/18/1978  . Smokeless tobacco: Never Used  . Alcohol Use: 0.0 oz/week    0 Standard drinks or equivalent per week     Comment: weekend- use of beer    OB History    No data available     Review of Systems  Review of Systems All other systems negative except as documented in the HPI. All pertinent positives and negatives as reviewed in the HPI.   Allergies  Celery oil; Pork-derived  products; and Latex  Home Medications   Prior to Admission medications   Medication Sig Start Date End Date Taking? Authorizing Provider  acetaminophen (TYLENOL) 325 MG tablet Take 2 tablets (650 mg total) by mouth every 4 (four) hours as needed (temp > 100.5). 09/22/15   [name], MD  albuterol (PROVENTIL HFA;VENTOLIN HFA) 108 (90 Base) MCG/ACT inhaler Inhale 1-2 puffs into the lungs every 6 (six) hours as needed for wheezing or shortness of breath. 11/22/15   Rigoberto Noel, MD  atenolol (TENORMIN) 50 MG tablet Take 1 tablet (50 mg total) by mouth daily. 09/10/14   Eugenie Filler, MD  budesonide-formoterol [facility]) 160-4.5 MCG/ACT inhaler Inhale 2 puffs into the lungs 2 (two) times daily. 11/22/15   Rigoberto Noel, MD  diazepam (VALIUM) 5 MG tablet Take 1 tablet (5 mg total) by mouth every 6 (six) hours as needed for muscle spasms. 12/01/15   Forde Dandy, MD  DULoxetine (CYMBALTA) 60 MG capsule Take 60 mg by mouth at bedtime.     Historical Provider, MD  fluticasone (FLONASE) 50 MCG/ACT nasal spray Place 1 spray into both nostrils 2 (two) times daily. Patient taking differently: Place 1 spray into both nostrils daily as needed for allergies.  04/07/15   Nona Dell, PA-C  gabapentin (NEURONTIN) 600 MG tablet Take 1 tablet (600 mg total) by mouth daily. Patient taking differently: Take 600 mg by mouth 2 (two) times daily.  09/23/15   [name], MD  HYDROcodone-acetaminophen (NORCO/VICODIN) 5-325 MG tablet Take 1 tablet by mouth every 6 (six) hours as needed. 11/12/15   Varney Biles, MD  HYDROcodone-acetaminophen (NORCO/VICODIN) 5-325 MG tablet Take 2 tablets by mouth every 4 (four) hours as needed. 12/01/15   Forde Dandy, MD  insulin NPH-regular Human (NOVOLIN 70/30) (70-30) 100 UNIT/ML injection Inject 30 Units into the skin 2 (two) times daily with a meal.    Historical Provider, MD  lactulose (CHRONULAC) 10 GM/15ML solution Take 45 mLs (30 g total) by mouth 2 (two) times  daily. 09/22/15   [name], MD  linagliptin (TRADJENTA) 5 MG TABS tablet Take 1 tablet (5 mg total) by mouth daily with breakfast. 09/22/15   [name], MD  nabumetone (RELAFEN) 500 MG tablet Take 1 tablet (500 mg total) by mouth 2 (two) times daily. 07/25/15   Myeong O Sheard, DPM  naproxen (NAPROSYN) 375 MG tablet Take 1 tablet (375 mg total) by mouth 2 (two) times daily. 12/28/15   Tyah Acord Carlota Raspberry, PA-C  naproxen (NAPROSYN) 500 MG tablet Take 1 tablet (500 mg total) by mouth 2 (two) times daily as needed for moderate pain. 12/01/15   Forde Dandy, MD  omeprazole (PRILOSEC) 20 MG capsule Take 20 mg  by mouth daily.     Historical Provider, MD  ondansetron (ZOFRAN ODT) 8 MG disintegrating tablet Take 1 tablet (8 mg total) by mouth every 8 (eight) hours as needed for nausea or vomiting. 01/28/14   Sherwood Gambler, MD  oxyCODONE-acetaminophen (PERCOCET) 10-325 MG tablet Take 1 tablet by mouth every 6 (six) hours as needed for pain. 12/15/15   Myeong O Sheard, DPM  polyethylene glycol (MIRALAX / GLYCOLAX) packet Take 17 g by mouth daily as needed for mild constipation. 09/22/15   [name], MD  Psyllium (METAMUCIL PO) Take 1 scoop by mouth daily as needed (for constipation).     Historical Provider, MD  tamsulosin (FLOMAX) 0.4 MG CAPS capsule Take 1 capsule (0.4 mg total) by mouth daily. 09/23/15   Consuella Lose, MD  tiZANidine (ZANAFLEX) 4 MG tablet Take 1 tablet (4 mg total) by mouth every 8 (eight) hours as needed for muscle spasms. 09/10/14   Eugenie Filler, MD  trimethoprim-polymyxin b Mayra Neer) ophthalmic solution Place 1 drop into the left eye every 4 (four) hours. Patient taking differently: Place 1 drop into the left eye every 4 (four) hours as needed (irratation).  02/19/14   Donne Hazel, MD   BP 160/101 mmHg  Pulse 105  Temp(Src) 98.1 F (36.7 C) (Oral)  Resp 18  SpO2 99% Physical Exam  Constitutional: She appears well-developed and well-nourished. No distress.  HENT:  Head:  Normocephalic and atraumatic.  Eyes: Pupils are equal, round, and reactive to light.  Neck: Normal range of motion. Neck supple.  Cardiovascular: Normal rate and regular rhythm.   Pulmonary/Chest: Effort normal.  Abdominal: Soft.  Musculoskeletal:  Sensation of right foot, toes are all pink with intact sensations. CR > 3 seconds to all toes. FROM. Compartments of foot are soft. No signs of skin wounds, rash, deformities. No FB noted.  + tenderness to palpation of the heel  Neurological: She is alert.  Skin: Skin is warm and dry.  Nursing note and vitals reviewed.   ED Course  Procedures (including critical care time) Labs Review Labs Reviewed - No data to display  Imaging Review Dg Foot Complete Left  12/28/2015  CLINICAL DATA:  45 year old female with left foot pain. No known injury. EXAM: LEFT FOOT - COMPLETE 3+ VIEW COMPARISON:  None. FINDINGS: There is no acute fracture or dislocation. The bones are well mineralized. Mild degenerative changes of the tarsal bone. 8 mm calcaneal spur. The soft tissues appear unremarkable. IMPRESSION: Negative. Electronically Signed   By: Anner Crete M.D.   On: 12/28/2015 01:23   I have personally reviewed and evaluated these images and lab results as part of my medical decision-making.   EKG Interpretation None      MDM   Final diagnoses:  Left foot pain    Patient has a normal exam of the left foot at this time aside from tenderness to the heel. Patient likely is compensating with the left foot has some mild bruising. Due to her complicated past medical history I recommend she follow-up closely with her podiatrist or her primary care doctor to have her foot reevaluated within the next couple of days. Give Naprosyn for pain.\  I discussed results, diagnoses and plan with [name]. They voice there understanding and questions were answered. We discussed follow-up recommendations and return precautions.    Delos Haring,  PA-C [id number]  Delora Fuel, MD [id number]

## 2015-12-28 NOTE — Patient Instructions (Signed)
Severe Tenosynovitis. Continue with Naproxen and pain medication as needed. Stay in CAM walker for ambulation. Return in 2 weeks.

## 2015-12-28 NOTE — Progress Notes (Signed)
Subjective:  45 year old female presents accompanied by her daughter complaining of left foot pain for duration of 2 days. The pain was so severe she had to go to ER last night. They examined and gave her Naproxen.   Pain is much more severe then the usual right foot pain that she was having for the past few years.  Still having much pain to ambulate. Patient points mid foot and ankle joint left foot being the area of pain. She was seeing at this office monthly for her right foot and was scheduled to have right foot surgery, explore and stabilize mid foot if needed.  She has had neck surgery on 09/16/15 and was discharged on 09/23/15.   Objective: Dermatologic: All nails are thick and hypertrophic.  Vascular status are within normal. Neurologic: Pain in dorsum and ankle with ambulation left foot severe              Unable to place full weight on left foot and getting around in tip toes and very painful.      Orthopedic: Positive of elevated first ray with forefoot varus right. Tight Achilles tendon bilateral.   Assessment: 1. Pain in left midfoot and ankle. 2. Difficulty walking and frequent fall.  3. Osteoarthropathy foot and ankle bilateral. 4. Faulty biomechanics bilateral.  5. Ankle Equinus bilateral.  6. Diabetic under control. 7. Weakness left arm. 8. Chronic knee pain. 9. Onychomycosis x 10.   Plan: Reviewed clinical findings and available treatment options. Took off from right foot surgery schedule. Left lower limb placed in CAM walker.  Continue with Naproxen. Pain medication if needed. Return in 2 weeks.

## 2015-12-28 NOTE — ED Notes (Signed)
Pt reports left foot pain since yesterday. [place] started. Pt denies any fall or injury to foot.

## 2016-01-05 ENCOUNTER — Ambulatory Visit (INDEPENDENT_AMBULATORY_CARE_PROVIDER_SITE_OTHER): Payer: Medicare Other | Admitting: Podiatry

## 2016-01-05 ENCOUNTER — Encounter: Payer: Self-pay | Admitting: Podiatry

## 2016-01-05 DIAGNOSIS — M659 Synovitis and tenosynovitis, unspecified: Secondary | ICD-10-CM | POA: Diagnosis not present

## 2016-01-05 DIAGNOSIS — R262 Difficulty in walking, not elsewhere classified: Secondary | ICD-10-CM

## 2016-01-05 DIAGNOSIS — M21961 Unspecified acquired deformity of right lower leg: Secondary | ICD-10-CM | POA: Diagnosis not present

## 2016-01-05 MED ORDER — OXYCODONE-ACETAMINOPHEN 10-325 MG PO TABS: 1.0000 | ORAL_TABLET | ORAL | Status: DC | PRN

## 2016-01-05 NOTE — Patient Instructions (Signed)
Seen for left foot pain. Medication re ordered.  Advised to cut down pain medication.

## 2016-01-05 NOTE — Progress Notes (Signed)
Subjective:  45 year old female presents accompanied by her daughter complaining of left foot pain. She was placed in CAM walker last week for severe left foot pain. She was in ER for this foot pain on 12/27/15. They examined and gave her Naproxen.  At this time her left foot hurts more than the right.  Prior to this left foot pain she was seeing at this office monthly for her right foot pain and was scheduled to have right foot surgery, explore and stabilize mid foot if needed.  She has been doubling her pain medication and now she is out of her pain pills.  She has had neck surgery on 09/16/15 and was discharged on 09/23/15.   Objective: Dermatologic: All nails are thick and hypertrophic.  Vascular status are within normal. Neurologic: Pain in dorsum and ankle with ambulation left foot severe  Unable to place full weight on left foot and getting around in tip toes and very painful.  Orthopedic: Positive of elevated first ray with forefoot varus right. Tight Achilles tendon bilateral.   Assessment: 1. Pain in left midfoot and ankle. 2. Difficulty walking and frequent fall.  3. Osteoarthropathy foot and ankle bilateral. 4. Faulty biomechanics bilateral.  5. Ankle Equinus bilateral.  6. Diabetic under control. 7. Weakness left arm. 8. Chronic knee pain. 9. Onychomycosis x 10.  Plan: Reviewed clinical findings and available treatment options. Advised to cut down on pain medication to two times daily if needed. Continue stay in CAM walker.  Continue with Naproxen. Pain medication if needed. Return in 2 weeks.

## 2016-01-11 ENCOUNTER — Ambulatory Visit: Payer: Medicare Other | Admitting: Podiatry

## 2016-01-12 ENCOUNTER — Encounter: Payer: Medicare Other | Admitting: Podiatry

## 2016-01-16 ENCOUNTER — Encounter (HOSPITAL_COMMUNITY): Payer: Self-pay | Admitting: Nurse Practitioner

## 2016-01-16 ENCOUNTER — Emergency Department (HOSPITAL_COMMUNITY)
Admission: EM | Admit: 2016-01-16 | Discharge: 2016-01-16 | Disposition: A | Discharge status: Home / Self Care | Payer: Medicare Other | Attending: Emergency Medicine | Admitting: Emergency Medicine

## 2016-01-16 DIAGNOSIS — J45909 Unspecified asthma, uncomplicated: Secondary | ICD-10-CM | POA: Diagnosis not present

## 2016-01-16 DIAGNOSIS — M5416 Radiculopathy, lumbar region: Secondary | ICD-10-CM | POA: Diagnosis not present

## 2016-01-16 DIAGNOSIS — M545 Low back pain: Secondary | ICD-10-CM | POA: Diagnosis present

## 2016-01-16 DIAGNOSIS — I10 Essential (primary) hypertension: Secondary | ICD-10-CM | POA: Insufficient documentation

## 2016-01-16 DIAGNOSIS — M62838 Other muscle spasm: Secondary | ICD-10-CM

## 2016-01-16 DIAGNOSIS — Z794 Long term (current) use of insulin: Secondary | ICD-10-CM | POA: Diagnosis not present

## 2016-01-16 DIAGNOSIS — Z7984 Long term (current) use of oral hypoglycemic drugs: Secondary | ICD-10-CM | POA: Insufficient documentation

## 2016-01-16 DIAGNOSIS — Z79899 Other long term (current) drug therapy: Secondary | ICD-10-CM | POA: Insufficient documentation

## 2016-01-16 DIAGNOSIS — E1165 Type 2 diabetes mellitus with hyperglycemia: Secondary | ICD-10-CM | POA: Insufficient documentation

## 2016-01-16 DIAGNOSIS — Z87891 Personal history of nicotine dependence: Secondary | ICD-10-CM | POA: Insufficient documentation

## 2016-01-16 DIAGNOSIS — R739 Hyperglycemia, unspecified: Secondary | ICD-10-CM

## 2016-01-16 DIAGNOSIS — M62830 Muscle spasm of back: Secondary | ICD-10-CM | POA: Diagnosis not present

## 2016-01-16 DIAGNOSIS — Z91040 Latex allergy status: Secondary | ICD-10-CM | POA: Insufficient documentation

## 2016-01-16 DIAGNOSIS — F319 Bipolar disorder, unspecified: Secondary | ICD-10-CM | POA: Insufficient documentation

## 2016-01-16 LAB — CBC
HCT: 40.5 % (ref 36.0–46.0)
Hemoglobin: 12.9 g/dL (ref 12.0–15.0)
MCH: 25.1 pg — ABNORMAL LOW (ref 26.0–34.0)
MCHC: 31.9 g/dL (ref 30.0–36.0)
MCV: 78.9 fL (ref 78.0–100.0)
Platelets: 215 10*3/uL (ref 150–400)
RBC: 5.13 MIL/uL — ABNORMAL HIGH (ref 3.87–5.11)
RDW: 13.5 % (ref 11.5–15.5)
WBC: 5.5 10*3/uL (ref 4.0–10.5)

## 2016-01-16 LAB — COMPREHENSIVE METABOLIC PANEL
ALT: 12 U/L — ABNORMAL LOW (ref 14–54)
AST: 15 U/L (ref 15–41)
Albumin: 3.7 g/dL (ref 3.5–5.0)
Alkaline Phosphatase: 87 U/L (ref 38–126)
Anion gap: 12 (ref 5–15)
BUN: 6 mg/dL (ref 6–20)
CO2: 22 mmol/L (ref 22–32)
Calcium: 8.9 mg/dL (ref 8.9–10.3)
Chloride: 94 mmol/L — ABNORMAL LOW (ref 101–111)
Creatinine, Ser: 1.09 mg/dL — ABNORMAL HIGH (ref 0.44–1.00)
GFR calc Af Amer: >60 mL/min (ref >60)
GFR calc non Af Amer: 59 mL/min — ABNORMAL LOW (ref >60)
Glucose, Bld: 527 mg/dL (ref 65–99)
Potassium: 4 mmol/L (ref 3.5–5.1)
Sodium: 128 mmol/L — ABNORMAL LOW (ref 135–145)
Total Bilirubin: 0.2 mg/dL — ABNORMAL LOW (ref 0.3–1.2)
Total Protein: 6.9 g/dL (ref 6.5–8.1)

## 2016-01-16 LAB — URINALYSIS, ROUTINE W REFLEX MICROSCOPIC
Bilirubin Urine: NEGATIVE
Glucose, UA: >1000 mg/dL — AB
Hgb urine dipstick: NEGATIVE
Ketones, ur: NEGATIVE mg/dL
Leukocytes, UA: NEGATIVE
Nitrite: NEGATIVE
Protein, ur: NEGATIVE mg/dL
Specific Gravity, Urine: 1.039 — ABNORMAL HIGH (ref 1.005–1.030)
pH: 5.5 (ref 5.0–8.0)

## 2016-01-16 LAB — CBG MONITORING, ED
Glucose-Capillary: 448 mg/dL — ABNORMAL HIGH (ref 65–99)
Glucose-Capillary: 355 mg/dL — ABNORMAL HIGH (ref 65–99)

## 2016-01-16 LAB — I-STAT BETA HCG BLOOD, ED (MC, WL, AP ONLY): I-stat hCG, quantitative: <5 m[IU]/mL (ref <5)

## 2016-01-16 LAB — URINE MICROSCOPIC-ADD ON: Bacteria, UA: NONE SEEN

## 2016-01-16 MED ORDER — CYCLOBENZAPRINE HCL 10 MG PO TABS: 10.0000 mg | ORAL_TABLET | Freq: Two times a day (BID) | ORAL | 0 refills | Status: DC | PRN

## 2016-01-16 MED ORDER — INSULIN ASPART 100 UNIT/ML ~~LOC~~ SOLN
10.0000 [IU] | Freq: Once | SUBCUTANEOUS | Status: AC
  Administered 2016-01-16: 10 [IU] via INTRAVENOUS
  Filled 2016-01-16: qty 1

## 2016-01-16 MED ORDER — CYCLOBENZAPRINE HCL 10 MG PO TABS
10.0000 mg | ORAL_TABLET | Freq: Once | ORAL | Status: AC
  Administered 2016-01-16: 10 mg via ORAL
  Filled 2016-01-16: qty 1

## 2016-01-16 MED ORDER — SODIUM CHLORIDE 0.9 % IV BOLUS (SEPSIS)
1000.0000 mL | Freq: Once | INTRAVENOUS | Status: AC
  Administered 2016-01-16: 1000 mL via INTRAVENOUS

## 2016-01-16 NOTE — ED Triage Notes (Signed)
She c/o 2 week history of L side pain, n/v. She reports her stool was green this morning. She also c/o neck pain, she had surgery here on her neck in march, she c/o continued pain since. She has followed up with surgeon. She is alert, breathing easily

## 2016-01-16 NOTE — ED Notes (Signed)
Rob PA at bedside 

## 2016-01-16 NOTE — ED Notes (Signed)
Pt's CBG result was 355. Informed Callie - RN.

## 2016-01-16 NOTE — ED Notes (Signed)
Pt in bathroom

## 2016-01-16 NOTE — ED Notes (Signed)
Pts CBG [id number]

## 2016-01-16 NOTE — ED Provider Notes (Signed)
[facility] DEPT Provider Note   CSN: [id number] Arrival date & time: 01/16/16  1356  First Provider Contact:  First MD Initiated Contact with Patient 01/16/16 1746        History   Chief Complaint Chief Complaint  Patient presents with  . Abdominal Pain    HPI Diana Henderson is a 45 y.o. female.  Patient presents to the emergency department with chief complaint of low back pain.  She also reports having muscle spasms in her upper back. Patient states that her low back pain radiates to her left leg and left abdomen, she denies any tenderness to palpation of her abdomen.  She denies any bowel or bladder incontinence. Denies any dysuria or hematuria. Denies any fevers chills. She states that she is not eating anything today. She has not tried taking anything for her muscle spasms.   The history is provided by the patient. No language interpreter was used.    Past Medical History:  Diagnosis Date  . Anxiety   . Arthritis    osteoarthritis   . Asthma    has been eval. by Dr. Gwenette Greet- last 06/2014  . Bipolar 1 disorder ([place])   . Complication of anesthesia    09/13/14: malignant HTN with inducction, case cx but no sourse identified; 11/05/14 remifentanyl infusion used to attenuate hypergynamic induction  . Depression   . Diabetes mellitus   . GERD (gastroesophageal reflux disease)   . Headache    sometimes has migraines   . Hyperlipidemia   . Hypertension   . Knee pain, chronic   . OSA on CPAP   . Pain in joint, ankle and foot 02/17/2013  . Pneumonia    hosp. for a couple of day- not sure when it was   . PTSD (post-traumatic stress disorder)   . Shortness of breath dyspnea   . Sleep apnea    uses CPAP intermittently, pt. doesn't remember where she had the study    Patient Active Problem List   Diagnosis Date Noted  . Diabetes mellitus, insulin dependent (IDDM), uncontrolled ([place]) 09/18/2015  . AKI (acute kidney injury) ([place]) 09/18/2015  . Morbid obesity ([place])  09/18/2015  . Asthma 09/18/2015  . Left ventricular diastolic dysfunction, NYHA class 1 09/18/2015  . Uncontrolled type 2 diabetes mellitus with complication ([place])   . Chronic diastolic CHF (congestive heart failure), NYHA class 1 ([place])   . PTSD (post-traumatic stress disorder)   . Bipolar I disorder ([place])   . Anxiety state   . Knee pain, chronic   . Cervical spondylosis with myelopathy 11/05/2014  . HTN (hypertension), malignant 09/08/2014  . HNP (herniated nucleus pulposus) with myelopathy, cervical 06/29/2014  . Deformity of metatarsal bone of right foot 06/28/2014  . CTS (carpal tunnel syndrome) 05/04/2014  . OSA (obstructive sleep apnea) 05/04/2014  . Hereditary and idiopathic peripheral neuropathy 05/04/2014  . Ganglion cyst 03/10/2014  . Vision loss, left eye 02/18/2014  . Conjunctivitis 02/17/2014  . Asthma exacerbation 01/13/2014  . Abdominal pain, unspecified site 10/28/2013  . Injury of foot, right, superficial 10/23/2013  . Tenosynovitis of foot 08/17/2013  . Pain in joint, ankle and foot 02/17/2013  . Edema of foot 02/17/2013  . Status post foot surgery 09/09/2012  . Hyperlipidemia   . 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 ANTERIOR CERVICAL DECOMP/DISCECTOMY FUSION N/A 11/05/2014   Procedure: ANTERIOR CERVICAL DECOMPRESSION/DISCECTOMY FUSION 1 LEVEL;  Surgeon: Consuella Lose, MD;  Location: [facility];  Service: Neurosurgery;  Laterality: N/A;  Cervical five- cervical seven anterior cervical decompression with fusion interbody prosthesis plating and bonegraft  . CARPAL TUNNEL RELEASE Left 2001   Left wrist  . CERVICAL LAMINECTOMY  03/31/20176   CERVICAL 4 SEVEN LAMINECTOMIES WITH LATERAL FUSION  . Cotton Osteotomy w/ Bone Graft Right 09/30/2014   Rt foot @ [facility]  . GASTROC RECESSION EXTREMITY Right 3//20/14   Right foot  . HERNIA REPAIR     1996  . Lapidus fusion  Right 01/10/12   Rt foot  . MIDTARSAL ARTHRODESIS Right 07/17/12   Rt foot  . POSTERIOR CERVICAL FUSION/FORAMINOTOMY N/A 09/16/2015   Procedure: Cervical four - seven laminectomies with lateral mass fusion;  Surgeon: Consuella Lose, MD;  Location: [facility];  Service: Neurosurgery;  Laterality: N/A;  C4-7 laminectomy with lateral mass fusion  . Remove implant deep Right 07/17/12   Rt foot  . Remove Implant Deep Right 07/22/2014   Rt foot @ [facility]  . TUBAL LIGATION      OB History    No data available       Home Medications    Prior to Admission medications   Medication Sig Start Date End Date Taking? Authorizing Provider  acetaminophen (TYLENOL) 325 MG tablet Take 2 tablets (650 mg total) by mouth every 4 (four) hours as needed (temp > 100.5). 09/22/15   [name], MD  albuterol (PROVENTIL HFA;VENTOLIN HFA) 108 (90 Base) MCG/ACT inhaler Inhale 1-2 puffs into the lungs every 6 (six) hours as needed for wheezing or shortness of breath. 11/22/15   Rigoberto Noel, MD  atenolol (TENORMIN) 50 MG tablet Take 1 tablet (50 mg total) by mouth daily. 09/10/14   Eugenie Filler, MD  budesonide-formoterol [facility]) 160-4.5 MCG/ACT inhaler Inhale 2 puffs into the lungs 2 (two) times daily. 11/22/15   Rigoberto Noel, MD  diazepam (VALIUM) 5 MG tablet Take 1 tablet (5 mg total) by mouth every 6 (six) hours as needed for muscle spasms. 12/01/15   Forde Dandy, MD  DULoxetine (CYMBALTA) 60 MG capsule Take 60 mg by mouth at bedtime.     Historical Provider, MD  fluticasone (FLONASE) 50 MCG/ACT nasal spray Place 1 spray into both nostrils 2 (two) times daily. Patient taking differently: Place 1 spray into both nostrils daily as needed for allergies.  04/07/15   Nona Dell, PA-C  gabapentin (NEURONTIN) 600 MG tablet Take 1 tablet (600 mg total) by mouth daily. Patient taking differently: Take 600 mg by mouth 2 (two) times daily.  09/23/15   [name], MD  HYDROcodone-acetaminophen  (NORCO/VICODIN) 5-325 MG tablet Take 1 tablet by mouth every 6 (six) hours as needed. 11/12/15   Varney Biles, MD  HYDROcodone-acetaminophen (NORCO/VICODIN) 5-325 MG tablet Take 2 tablets by mouth every 4 (four) hours as needed. 12/01/15   Forde Dandy, MD  insulin NPH-regular Human (NOVOLIN 70/30) (70-30) 100 UNIT/ML injection Inject 30 Units into the skin 2 (two) times daily with a meal.    Historical Provider, MD  lactulose (CHRONULAC) 10 GM/15ML solution Take 45 mLs (30 g total) by mouth 2 (two) times daily. 09/22/15   [name], MD  linagliptin (TRADJENTA) 5 MG TABS tablet Take 1 tablet (5 mg total) by mouth daily with breakfast. 09/22/15   [name], MD  nabumetone (RELAFEN) 500 MG tablet Take 1 tablet (500 mg total) by mouth 2 (two) times daily. 07/25/15   Myeong Roxine Caddy, DPM  naproxen (  NAPROSYN) 375 MG tablet Take 1 tablet (375 mg total) by mouth 2 (two) times daily. 12/28/15   Tiffany Carlota Raspberry, PA-C  naproxen (NAPROSYN) 500 MG tablet Take 1 tablet (500 mg total) by mouth 2 (two) times daily as needed for moderate pain. 12/01/15   Forde Dandy, MD  omeprazole (PRILOSEC) 20 MG capsule Take 20 mg by mouth daily.     Historical Provider, MD  ondansetron (ZOFRAN ODT) 8 MG disintegrating tablet Take 1 tablet (8 mg total) by mouth every 8 (eight) hours as needed for nausea or vomiting. 01/28/14   Sherwood Gambler, MD  oxyCODONE-acetaminophen (PERCOCET) 10-325 MG tablet Take 1 tablet by mouth every 4 (four) hours as needed for pain. 01/05/16   Myeong O Sheard, DPM  polyethylene glycol (MIRALAX / GLYCOLAX) packet Take 17 g by mouth daily as needed for mild constipation. 09/22/15   [name], MD  Psyllium (METAMUCIL PO) Take 1 scoop by mouth daily as needed (for constipation).     Historical Provider, MD  tamsulosin (FLOMAX) 0.4 MG CAPS capsule Take 1 capsule (0.4 mg total) by mouth daily. 09/23/15   Consuella Lose, MD  tiZANidine (ZANAFLEX) 4 MG tablet Take 1 tablet (4 mg total) by mouth every 8  (eight) hours as needed for muscle spasms. 09/10/14   Eugenie Filler, MD  trimethoprim-polymyxin b Mayra Neer) ophthalmic solution Place 1 drop into the left eye every 4 (four) hours. Patient taking differently: Place 1 drop into the left eye every 4 (four) hours as needed (irratation).  02/19/14   Donne Hazel, MD    Family History Family History  Problem Relation Age of Onset  . Kidney disease Mother   . Heart attack Father     Social History Social History  Substance Use Topics  . Smoking status: Former Smoker    Packs/day: 2.00    Years: 6.00    Types: Cigarettes    Quit date: 06/18/1978  . Smokeless tobacco: Never Used  . Alcohol use 0.0 oz/week     Comment: weekend- use of beer      Allergies   Celery oil; Pork-derived products; and Latex   Review of Systems Review of Systems  Musculoskeletal: Positive for myalgias.  All other systems reviewed and are negative.    Physical Exam Updated Vital Signs BP 130/68   Pulse 70   Temp 98 F (36.7 C) (Oral)   Resp 16   SpO2 99%   Physical Exam  Constitutional: She is oriented to person, place, and time. She appears well-developed and well-nourished.  HENT:  Head: Normocephalic and atraumatic.  Eyes: Conjunctivae and EOM are normal. Pupils are equal, round, and reactive to light.  Neck: Normal range of motion. Neck supple.  Cardiovascular: Normal rate and regular rhythm.  Exam reveals no gallop and no friction rub.   No murmur heard. Pulmonary/Chest: Effort normal and breath sounds normal. No respiratory distress. She has no wheezes. She has no rales. She exhibits no tenderness.  Abdominal: Soft. Bowel sounds are normal. She exhibits no distension and no mass. There is no tenderness. There is no rebound and no guarding.  No focal abdominal tenderness, no RLQ tenderness or pain at McBurney's point, no RUQ tenderness or Murphy's sign, no left-sided abdominal tenderness, no fluid wave, or signs of peritonitis     Musculoskeletal: Normal range of motion. She exhibits no edema or tenderness.  Upper trapezius tender to palpation bilaterally, no deformity Lumbar paraspinal muscles are tender to palpation CTLS spine is without  deformity, step-off, or evidence of infection.  Neurological: She is alert and oriented to person, place, and time.  Skin: Skin is warm and dry.  Psychiatric: She has a normal mood and affect. Her behavior is normal. Judgment and thought content normal.  Nursing note and vitals reviewed.    ED Treatments / Results  Labs (all labs ordered are listed, but only abnormal results are displayed) Labs Reviewed  COMPREHENSIVE METABOLIC PANEL - Abnormal; Notable for the following:       Result Value   Sodium 128 (*)    Chloride 94 (*)    Glucose, Bld 527 (*)    Creatinine, Ser 1.09 (*)    ALT 12 (*)    Total Bilirubin 0.2 (*)    GFR calc non Af Amer 59 (*)    All other components within normal limits  CBC - Abnormal; Notable for the following:    RBC 5.13 (*)    MCH 25.1 (*)    All other components within normal limits  URINALYSIS, ROUTINE W REFLEX MICROSCOPIC (NOT AT [facility])  I-STAT BETA HCG BLOOD, ED ([facility], [facility], [facility] ONLY)    EKG  EKG Interpretation None       Radiology No results found.  Procedures Procedures (including critical care time)  Medications Ordered in ED Medications  sodium chloride 0.9 % bolus 1,000 mL (1,000 mLs Intravenous New Bag/Given 01/16/16 1828)  insulin aspart (novoLOG) injection 10 Units (10 Units Intravenous Given 01/16/16 1828)  cyclobenzaprine (FLEXERIL) tablet 10 mg (10 mg Oral Given 01/16/16 1831)     Initial Impression / Assessment and Plan / ED Course  I have reviewed the triage vital signs and the nursing notes.  Pertinent labs & imaging results that were available during my care of the patient were reviewed by me and considered in my medical decision making (see chart for details).  Clinical Course    Patient with muscle spasms  in her upper back, as well as left-sided low back pain that radiates to her abdomen and legs. She has no abdominal tenderness. Her vital signs are stable. I doubt any infection or acute intra-abdominal abdominal process. She is noted to be quite hyper per glycemic at 537, will give fluids and insulin. She states that she did not take her morning medicines, but did just take her afternoon medication.  Normal anion gap. I suspect that her low back pain is likely lumbar radiculopathy. She states that she has been told this before in the past. I plan to treat her muscle spasms with Flexeril. She will receive fluids, and it is anticipated that she'll be discharged with close primary care follow-up.  9:10 PM Glucose is trending down nicely.  Recommend primary care follow-up.   Final Clinical Impressions(s) / ED Diagnoses   Final diagnoses:  Hyperglycemia  Muscle spasm  Lumbar radiculopathy    New Prescriptions New Prescriptions   CYCLOBENZAPRINE (FLEXERIL) 10 MG TABLET    Take 1 tablet (10 mg total) by mouth 2 (two) times daily as needed for muscle spasms.     Montine Circle, PA-C 01/16/16 2111    Virgel Manifold, MD 01/19/16 2154

## 2016-01-19 ENCOUNTER — Ambulatory Visit (INDEPENDENT_AMBULATORY_CARE_PROVIDER_SITE_OTHER): Payer: Medicare Other | Admitting: Podiatry

## 2016-01-19 ENCOUNTER — Encounter: Payer: Self-pay | Admitting: Podiatry

## 2016-01-19 DIAGNOSIS — M659 Synovitis and tenosynovitis, unspecified: Secondary | ICD-10-CM

## 2016-01-19 DIAGNOSIS — M21961 Unspecified acquired deformity of right lower leg: Secondary | ICD-10-CM

## 2016-01-19 DIAGNOSIS — R262 Difficulty in walking, not elsewhere classified: Secondary | ICD-10-CM | POA: Diagnosis not present

## 2016-01-19 MED ORDER — OXYCODONE-ACETAMINOPHEN 10-325 MG PO TABS: 1.0000 | ORAL_TABLET | ORAL | 0 refills | Status: DC | PRN

## 2016-01-19 NOTE — Progress Notes (Signed)
Subjective:  45 year old female presents accompanied by her daughter complaining of pain in both feet, both about the same. Patient walks with cane. She is wearing regular shoes due to excess swelling. Still wears CAM walker at home.  Recent History: She was in ER for this foot pain on 12/27/15. They examined and gave her Naproxen.  For the past 3 weeks, her left foot hurts more than the right.  Prior to this left foot pain she was seeing at this office monthly for her right foot pain and was scheduled to have right foot surgery, explore and stabilize mid foot if needed. The surgery was cancelled for now due to left foot pain. Left hip area is hurting with Sciatic nerve problem for the past 3 weeks. She had to take extra pain medication. She was diagnosed with Lumbar Radiculopathy.   She has had neck surgery on 09/16/15 and was discharged on 09/23/15.   Objective: Dermatologic: All nails are thick and hypertrophic.  Vascular status are within normal. Neurologic: Pain in dorsum and ankle with ambulation left foot severe  Unable to place full weight on left foot and getting around in tip toes and very painful.  Orthopedic: Positive of elevated first ray with forefoot varus right. Tight Achilles tendon bilateral.   Assessment: 1. Pain in left midfoot and ankle. 2. Difficulty walking and frequent fall.  3. Osteoarthropathy foot and ankle bilateral. 4. Faulty biomechanics bilateral.  5. Ankle Equinus bilateral.  6. Diabetic under control. 7. Weakness left arm. 8. Chronic knee pain. 9. Onychomycosis x 10. 10. Lumbar Radiculopathy.  Plan: Reviewed clinical findings and available treatment options. Advised to cut down on pain medication to two times daily if needed. Continue stay in CAM walker.  Continue with Naproxen. Pain medication if needed. Return in 2 weeks.

## 2016-01-19 NOTE — Patient Instructions (Signed)
Seen for bilateral foot pain. Diagnosed with Lumbar Radiculopathy with Sciatica. Pain medication prescribed as per request. Patient is to decrease medication two a day.  Return in 3 weeks.

## 2016-01-25 DIAGNOSIS — Z0000 Encounter for general adult medical examination without abnormal findings: Secondary | ICD-10-CM | POA: Diagnosis not present

## 2016-01-25 DIAGNOSIS — Z5181 Encounter for therapeutic drug level monitoring: Secondary | ICD-10-CM | POA: Diagnosis not present

## 2016-01-25 DIAGNOSIS — I119 Hypertensive heart disease without heart failure: Secondary | ICD-10-CM | POA: Diagnosis not present

## 2016-01-25 DIAGNOSIS — Z01118 Encounter for examination of ears and hearing with other abnormal findings: Secondary | ICD-10-CM | POA: Diagnosis not present

## 2016-01-25 DIAGNOSIS — Z6841 Body Mass Index (BMI) 40.0 and over, adult: Secondary | ICD-10-CM | POA: Diagnosis not present

## 2016-01-25 DIAGNOSIS — I10 Essential (primary) hypertension: Secondary | ICD-10-CM | POA: Diagnosis not present

## 2016-01-25 DIAGNOSIS — M4712 Other spondylosis with myelopathy, cervical region: Secondary | ICD-10-CM | POA: Diagnosis not present

## 2016-01-25 DIAGNOSIS — E119 Type 2 diabetes mellitus without complications: Secondary | ICD-10-CM | POA: Diagnosis not present

## 2016-01-25 DIAGNOSIS — E669 Obesity, unspecified: Secondary | ICD-10-CM | POA: Diagnosis not present

## 2016-01-25 DIAGNOSIS — G629 Polyneuropathy, unspecified: Secondary | ICD-10-CM | POA: Diagnosis not present

## 2016-01-25 DIAGNOSIS — G4733 Obstructive sleep apnea (adult) (pediatric): Secondary | ICD-10-CM | POA: Diagnosis not present

## 2016-01-25 DIAGNOSIS — M19079 Primary osteoarthritis, unspecified ankle and foot: Secondary | ICD-10-CM | POA: Diagnosis not present

## 2016-01-25 DIAGNOSIS — J45909 Unspecified asthma, uncomplicated: Secondary | ICD-10-CM | POA: Diagnosis not present

## 2016-02-01 ENCOUNTER — Emergency Department (HOSPITAL_COMMUNITY)
Admission: EM | Admit: 2016-02-01 | Discharge: 2016-02-02 | Disposition: A | Discharge status: Home / Self Care | Payer: Medicare Other | Attending: Emergency Medicine | Admitting: Emergency Medicine

## 2016-02-01 ENCOUNTER — Encounter (HOSPITAL_COMMUNITY): Payer: Self-pay

## 2016-02-01 ENCOUNTER — Emergency Department (HOSPITAL_COMMUNITY): Payer: Medicare Other

## 2016-02-01 DIAGNOSIS — N179 Acute kidney failure, unspecified: Secondary | ICD-10-CM

## 2016-02-01 DIAGNOSIS — R079 Chest pain, unspecified: Secondary | ICD-10-CM | POA: Diagnosis present

## 2016-02-01 DIAGNOSIS — J3089 Other allergic rhinitis: Secondary | ICD-10-CM

## 2016-02-01 DIAGNOSIS — I10 Essential (primary) hypertension: Secondary | ICD-10-CM

## 2016-02-01 DIAGNOSIS — Z91040 Latex allergy status: Secondary | ICD-10-CM | POA: Diagnosis not present

## 2016-02-01 DIAGNOSIS — R05 Cough: Secondary | ICD-10-CM | POA: Diagnosis not present

## 2016-02-01 DIAGNOSIS — Z7984 Long term (current) use of oral hypoglycemic drugs: Secondary | ICD-10-CM | POA: Insufficient documentation

## 2016-02-01 DIAGNOSIS — R0789 Other chest pain: Secondary | ICD-10-CM

## 2016-02-01 DIAGNOSIS — B3731 Acute candidiasis of vulva and vagina: Secondary | ICD-10-CM

## 2016-02-01 DIAGNOSIS — I110 Hypertensive heart disease with heart failure: Secondary | ICD-10-CM | POA: Insufficient documentation

## 2016-02-01 DIAGNOSIS — Z77120 Contact with and (suspected) exposure to mold (toxic): Secondary | ICD-10-CM

## 2016-02-01 DIAGNOSIS — E1165 Type 2 diabetes mellitus with hyperglycemia: Secondary | ICD-10-CM

## 2016-02-01 DIAGNOSIS — I5032 Chronic diastolic (congestive) heart failure: Secondary | ICD-10-CM | POA: Insufficient documentation

## 2016-02-01 DIAGNOSIS — J45901 Unspecified asthma with (acute) exacerbation: Secondary | ICD-10-CM | POA: Diagnosis not present

## 2016-02-01 DIAGNOSIS — Z87891 Personal history of nicotine dependence: Secondary | ICD-10-CM | POA: Insufficient documentation

## 2016-02-01 DIAGNOSIS — Z794 Long term (current) use of insulin: Secondary | ICD-10-CM | POA: Diagnosis not present

## 2016-02-01 DIAGNOSIS — R52 Pain, unspecified: Secondary | ICD-10-CM

## 2016-02-01 DIAGNOSIS — B373 Candidiasis of vulva and vagina: Secondary | ICD-10-CM | POA: Diagnosis not present

## 2016-02-01 DIAGNOSIS — R059 Cough, unspecified: Secondary | ICD-10-CM

## 2016-02-01 LAB — BASIC METABOLIC PANEL
Anion gap: 10 (ref 5–15)
BUN: 6 mg/dL (ref 6–20)
CHLORIDE: 91 mmol/L — AB (ref 101–111)
CO2: 26 mmol/L (ref 22–32)
Calcium: 9.5 mg/dL (ref 8.9–10.3)
Creatinine, Ser: 1.13 mg/dL — ABNORMAL HIGH (ref 0.44–1.00)
GFR calc non Af Amer: 57 mL/min — ABNORMAL LOW (ref >60)
Glucose, Bld: 622 mg/dL (ref 65–99)
POTASSIUM: 4.1 mmol/L (ref 3.5–5.1)
SODIUM: 127 mmol/L — AB (ref 135–145)

## 2016-02-01 LAB — I-STAT TROPONIN, ED
Troponin i, poc: 0 ng/mL (ref 0.00–0.08)
Troponin i, poc: 0 ng/mL (ref 0.00–0.08)

## 2016-02-01 LAB — CBC
HEMATOCRIT: 43.4 % (ref 36.0–46.0)
Hemoglobin: 13.8 g/dL (ref 12.0–15.0)
MCH: 25.3 pg — ABNORMAL LOW (ref 26.0–34.0)
MCHC: 31.8 g/dL (ref 30.0–36.0)
MCV: 79.6 fL (ref 78.0–100.0)
PLATELETS: 243 10*3/uL (ref 150–400)
RBC: 5.45 MIL/uL — AB (ref 3.87–5.11)
RDW: 13.4 % (ref 11.5–15.5)
WBC: 6.7 10*3/uL (ref 4.0–10.5)

## 2016-02-01 LAB — CBG MONITORING, ED: GLUCOSE-CAPILLARY: 515 mg/dL — AB (ref 65–99)

## 2016-02-01 MED ORDER — HYDROCODONE-ACETAMINOPHEN 5-325 MG PO TABS
1.0000 | ORAL_TABLET | Freq: Once | ORAL | Status: AC
  Administered 2016-02-02: 1 via ORAL
  Filled 2016-02-01: qty 1

## 2016-02-01 MED ORDER — INSULIN ASPART 100 UNIT/ML ~~LOC~~ SOLN
10.0000 [IU] | Freq: Once | SUBCUTANEOUS | Status: AC
  Administered 2016-02-02: 10 [IU] via SUBCUTANEOUS
  Filled 2016-02-01: qty 1

## 2016-02-01 MED ORDER — SODIUM CHLORIDE 0.9 % IV BOLUS (SEPSIS)
1000.0000 mL | Freq: Once | INTRAVENOUS | Status: AC
  Administered 2016-02-01: 1000 mL via INTRAVENOUS

## 2016-02-01 NOTE — ED Notes (Signed)
Dr. Oleta Mouse ( EDP ) notified on pt.'s elevated blood glucose .

## 2016-02-01 NOTE — ED Notes (Signed)
Nurse [facility]) notified on pt.'s elevated blood glucose and upgraded acuity .

## 2016-02-01 NOTE — ED Triage Notes (Signed)
Pt here with c/o generalized body pain:feet, legs and chest; onset last night after moving heavy furniture, stating "my whole body hurts." Pt also reports mold in her house.

## 2016-02-01 NOTE — ED Provider Notes (Signed)
[facility] DEPT Provider Note   CSN: [id number] Arrival date & time: 02/01/16  1856     History   Chief Complaint Chief Complaint  Patient presents with  . Generalized Body Aches    HPI Diana Henderson is a 45 y.o. female with a PMHx of arthritis, headaches, DM2, bipolar disorder, asthma, HTN, HLD, GERD, diastolic dysfunction/CHF, cervical spondylosis s/p C4-7 fusion, and other medical/surgical hx listed below, who presents to the ED with complaints of generalized pain that began yesterday. Patient has been moving furniture because she was told she had mold in her apartment and needs to move the furniture in order to have the carpet pulled up. She describes the pain as 9/10 constant sore and aching pain all over her entire body, nonradiating, worse with movement, and unrelieved with Tylenol. She reports chest pain as part of her generalized pain, but denies that her pain is focal in one specific area, just states it's "everywhere". She reports an associated dry cough due to the mold, and rhinorrhea.  She denies any fevers, chills, diaphoresis, lightheadedness, vision changes, ear pain or drainage, sore throat, shortness breath, leg swelling, recent travel/surgery/immobilization, estrogen use, personal or family history of DVT/PE, abdominal pain, nausea, vomiting, diarrhea, constipation, dysuria, hematuria, numbness, tingling, focal weakness, or orthopnea. She is a nonsmoker. +FHx of MI in her father.   States she is compliant with all her medications, but can't recall exactly what medications she takes. States "whatever is in the system is correct, that's what I take". I asked that she verify if she's on Novolin 70/30 taking 25U BID and she states that she is on that, and compliant with it. Asked if she still takes Tradjenta 5mg  daily and she can't remember but says "if it's on my list, I take it". Denies recently changes in her diabetes regimen. States that her sugars have been running  high and her PCP referred her to a dietician for ?gastric bypass surgery (says "they'll take my skin from my belly and I'll lose weight and not need insulin").    The history is provided by the patient and medical records. No language interpreter was used.    Past Medical History:  Diagnosis Date  . Anxiety   . Arthritis    osteoarthritis   . Asthma    has been eval. by Dr. Gwenette Greet- last 06/2014  . Bipolar 1 disorder ([place])   . Complication of anesthesia    09/13/14: malignant HTN with inducction, case cx but no sourse identified; 11/05/14 remifentanyl infusion used to attenuate hypergynamic induction  . Depression   . Diabetes mellitus   . GERD (gastroesophageal reflux disease)   . Headache    sometimes has migraines   . Hyperlipidemia   . Hypertension   . Knee pain, chronic   . OSA on CPAP   . Pain in joint, ankle and foot 02/17/2013  . Pneumonia    hosp. for a couple of day- not sure when it was   . PTSD (post-traumatic stress disorder)   . Shortness of breath dyspnea   . Sleep apnea    uses CPAP intermittently, pt. doesn't remember where she had the study    Patient Active Problem List   Diagnosis Date Noted  . Diabetes mellitus, insulin dependent (IDDM), uncontrolled ([place]) 09/18/2015  . AKI (acute kidney injury) ([place]) 09/18/2015  . Morbid obesity ([place]) 09/18/2015  . Asthma 09/18/2015  . Left ventricular diastolic dysfunction, NYHA class 1 09/18/2015  . Uncontrolled type 2 diabetes mellitus with  complication ([place])   . Chronic diastolic CHF (congestive heart failure), NYHA class 1 ([place])   . PTSD (post-traumatic stress disorder)   . Bipolar I disorder ([place])   . Anxiety state   . Knee pain, chronic   . Cervical spondylosis with myelopathy 11/05/2014  . HTN (hypertension), malignant 09/08/2014  . HNP (herniated nucleus pulposus) with myelopathy, cervical 06/29/2014  . Deformity of metatarsal bone of right foot 06/28/2014  . CTS (carpal tunnel syndrome) 05/04/2014  . OSA  (obstructive sleep apnea) 05/04/2014  . Hereditary and idiopathic peripheral neuropathy 05/04/2014  . Ganglion cyst 03/10/2014  . Vision loss, left eye 02/18/2014  . Conjunctivitis 02/17/2014  . Asthma exacerbation 01/13/2014  . Abdominal pain, unspecified site 10/28/2013  . Injury of foot, right, superficial 10/23/2013  . Tenosynovitis of foot 08/17/2013  . Pain in joint, ankle and foot 02/17/2013  . Edema of foot 02/17/2013  . Status post foot surgery 09/09/2012  . Hyperlipidemia   . 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 ANTERIOR CERVICAL DECOMP/DISCECTOMY FUSION N/A 11/05/2014   Procedure: ANTERIOR CERVICAL DECOMPRESSION/DISCECTOMY FUSION 1 LEVEL;  Surgeon: Consuella Lose, MD;  Location: [facility];  Service: Neurosurgery;  Laterality: N/A;  Cervical five- cervical seven anterior cervical decompression with fusion interbody prosthesis plating and bonegraft  . CARPAL TUNNEL RELEASE Left 2001   Left wrist  . CERVICAL LAMINECTOMY  03/31/20176   CERVICAL 4 SEVEN LAMINECTOMIES WITH LATERAL FUSION  . Cotton Osteotomy w/ Bone Graft Right 09/30/2014   Rt foot @ [facility]  . GASTROC RECESSION EXTREMITY Right 3//20/14   Right foot  . HERNIA REPAIR     1996  . [place]us fusion Right 01/10/12   Rt foot  . MIDTARSAL ARTHRODESIS Right 07/17/12   Rt foot  . POSTERIOR CERVICAL FUSION/FORAMINOTOMY N/A 09/16/2015   Procedure: Cervical four - seven laminectomies with lateral mass fusion;  Surgeon: Consuella Lose, MD;  Location: [facility];  Service: Neurosurgery;  Laterality: N/A;  C4-7 laminectomy with lateral mass fusion  . Remove implant deep Right 07/17/12   Rt foot  . Remove Implant Deep Right 07/22/2014   Rt foot @ [facility]  . TUBAL LIGATION      OB History    No data available       Home Medications    Prior to Admission medications   Medication Sig Start Date End Date Taking? Authorizing  Provider  acetaminophen (TYLENOL) 325 MG tablet Take 2 tablets (650 mg total) by mouth every 4 (four) hours as needed (temp > 100.5). Patient taking differently: Take 650 mg by mouth every 4 (four) hours as needed for fever (temp > 100.5/ pain).  09/22/15   [name], MD  albuterol (PROVENTIL HFA;VENTOLIN HFA) 108 (90 Base) MCG/ACT inhaler Inhale 1-2 puffs into the lungs every 6 (six) hours as needed for wheezing or shortness of breath. 11/22/15   Rigoberto Noel, MD  atenolol (TENORMIN) 50 MG tablet Take 1 tablet (50 mg total) by mouth daily. 09/10/14   Eugenie Filler, MD  budesonide-formoterol [facility]) 160-4.5 MCG/ACT inhaler Inhale 2 puffs into the lungs 2 (two) times daily. Patient taking differently: Inhale 2 puffs into the lungs 2 (two) times daily as needed (shortness of breath/ wheezing).  11/22/15   Rigoberto Noel, MD  cyclobenzaprine (FLEXERIL) 10 MG tablet Take 1 tablet (10 mg total) by mouth 2 (two) times daily as needed for muscle spasms. 01/16/16  Montine Circle, PA-C  diazepam (VALIUM) 5 MG tablet Take 1 tablet (5 mg total) by mouth every 6 (six) hours as needed for muscle spasms. 12/01/15   Forde Dandy, MD  DULoxetine (CYMBALTA) 60 MG capsule Take 60 mg by mouth at bedtime.     Historical Provider, MD  fluticasone (FLONASE) 50 MCG/ACT nasal spray Place 1 spray into both nostrils 2 (two) times daily. Patient taking differently: Place 1 spray into both nostrils daily as needed for allergies.  04/07/15   Nona Dell, PA-C  gabapentin (NEURONTIN) 600 MG tablet Take 1 tablet (600 mg total) by mouth daily. Patient taking differently: Take 600 mg by mouth 2 (two) times daily.  09/23/15   [name], MD  HYDROcodone-acetaminophen (NORCO/VICODIN) 5-325 MG tablet Take 1 tablet by mouth every 6 (six) hours as needed. Patient not taking: Reported on 01/16/2016 11/12/15   Varney Biles, MD  HYDROcodone-acetaminophen (NORCO/VICODIN) 5-325 MG tablet Take 2 tablets by mouth every 4  (four) hours as needed. Patient not taking: Reported on 01/16/2016 12/01/15   Forde Dandy, MD  insulin aspart protamine - aspart (NOVOLOG MIX 70/30 FLEXPEN) (70-30) 100 UNIT/ML FlexPen Inject 25 Units into the skin 2 (two) times daily with a meal.    Historical Provider, MD  lactulose (CHRONULAC) 10 GM/15ML solution Take 45 mLs (30 g total) by mouth 2 (two) times daily. Patient not taking: Reported on 01/16/2016 09/22/15   [name], MD  linagliptin (TRADJENTA) 5 MG TABS tablet Take 1 tablet (5 mg total) by mouth daily with breakfast. 09/22/15   [name], MD  nabumetone (RELAFEN) 500 MG tablet Take 1 tablet (500 mg total) by mouth 2 (two) times daily. 07/25/15   Myeong O Sheard, DPM  naproxen (NAPROSYN) 375 MG tablet Take 1 tablet (375 mg total) by mouth 2 (two) times daily. Patient taking differently: Take 375 mg by mouth 2 (two) times daily as needed (pain).  12/28/15   Tiffany Carlota Raspberry, PA-C  naproxen (NAPROSYN) 500 MG tablet Take 1 tablet (500 mg total) by mouth 2 (two) times daily as needed for moderate pain. Patient not taking: Reported on 01/16/2016 12/01/15   Forde Dandy, MD  omeprazole (PRILOSEC) 20 MG capsule Take 20 mg by mouth daily.     Historical Provider, MD  ondansetron (ZOFRAN ODT) 8 MG disintegrating tablet Take 1 tablet (8 mg total) by mouth every 8 (eight) hours as needed for nausea or vomiting. Patient not taking: Reported on 01/16/2016 01/28/14   Sherwood Gambler, MD  oxyCODONE-acetaminophen (PERCOCET) 10-325 MG tablet Take 1 tablet by mouth every 4 (four) hours as needed for pain. 01/19/16   Myeong O Sheard, DPM  polyethylene glycol (MIRALAX / GLYCOLAX) packet Take 17 g by mouth daily as needed for mild constipation. 09/22/15   [name], MD  PRESCRIPTION MEDICATION Inhale into the lungs at bedtime. CPAP    Historical Provider, MD  tiZANidine (ZANAFLEX) 4 MG tablet Take 1 tablet (4 mg total) by mouth every 8 (eight) hours as needed for muscle spasms. 09/10/14   Eugenie Filler,  MD  trimethoprim-polymyxin b Mayra Neer) ophthalmic solution Place 1 drop into the left eye every 4 (four) hours. Patient not taking: Reported on 01/16/2016 02/19/14   Donne Hazel, MD    Family History Family History  Problem Relation Age of Onset  . Kidney disease Mother   . Heart attack Father     Social History Social History  Substance Use Topics  . Smoking status: Former Smoker  Packs/day: 2.00    Years: 6.00    Types: Cigarettes    Quit date: 06/18/1978  . Smokeless tobacco: Never Used  . Alcohol use 0.0 oz/week     Comment: weekend- use of beer      Allergies   Celery oil; Pork-derived products; Latex; and Tape   Review of Systems Review of Systems  Constitutional: Negative for chills, diaphoresis and fever.  HENT: Positive for rhinorrhea. Negative for ear discharge, ear pain and sore throat.   Eyes: Negative for visual disturbance.  Respiratory: Positive for cough. Negative for shortness of breath.   Cardiovascular: Positive for chest pain. Negative for leg swelling.  Gastrointestinal: Negative for abdominal pain, constipation, diarrhea, nausea and vomiting.  Genitourinary: Negative for dysuria and hematuria.  Musculoskeletal: Positive for myalgias (generalized). Negative for arthralgias.  Skin: Negative for color change.  Allergic/Immunologic: Positive for immunocompromised state (diabetic).  Neurological: Negative for weakness, light-headedness and numbness.  Psychiatric/Behavioral: Negative for confusion.   10 Systems reviewed and are negative for acute change except as noted in the HPI.   Physical Exam Updated Vital Signs TRIAGE VS: BP (!) 163/118 (BP Location: Right Arm)   Pulse 105   Temp 97.8 F (36.6 C) (Oral)   Resp 19   Ht 5\' 6"  (1.676 m)   Wt 128.2 kg   LMP 01/27/2016   SpO2 100%   BMI 45.61 kg/m Exam VS: BP 154/88   Pulse 92   Temp 97.8 F (36.6 C) (Oral)   Resp 19   Ht 5\' 6"  (1.676 m)   Wt 128.2 kg   LMP 01/27/2016   SpO2 98%    BMI 45.61 kg/m     Physical Exam  Constitutional: She is oriented to person, place, and time. Vital signs are normal. She appears well-developed and well-nourished.  Non-toxic appearance. No distress.  Afebrile, nontoxic, NAD  HENT:  Head: Normocephalic and atraumatic.  Right Ear: Hearing, tympanic membrane, external ear and ear canal normal.  Left Ear: Hearing, tympanic membrane, external ear and ear canal normal.  Nose: Mucosal edema and rhinorrhea present.  Mouth/Throat: Uvula is midline, oropharynx is clear and moist and mucous membranes are normal. No trismus in the jaw. No uvula swelling.  Ears are clear bilaterally. Nose with minimal edema and rhinorrhea. Oropharynx clear and moist, without uvular swelling or deviation, no trismus or drooling, no tonsillar swelling or erythema, no exudates.    Eyes: Conjunctivae and EOM are normal. Right eye exhibits no discharge. Left eye exhibits no discharge.  Neck: Normal range of motion. Neck supple.  Cardiovascular: Normal rate, regular rhythm, normal heart sounds and intact distal pulses.  Exam reveals no gallop and no friction rub.   No murmur heard. Tachycardic in triage but this resolved during exam, RRR, nl s1/s2, no m/r/g, distal pulses intact, and trace/1+ bilateral pedal edema   Pulmonary/Chest: Effort normal and breath sounds normal. No respiratory distress. She has no decreased breath sounds. She has no wheezes. She has no rhonchi. She has no rales. She exhibits tenderness. She exhibits no crepitus, no deformity and no retraction.    CTAB in all lung fields, no w/r/r, no hypoxia or increased WOB, speaking in full sentences, SpO2 100% on RA Chest wall with minimal diffuse anterior TTP, without crepitus, deformities, or retractions   Abdominal: Soft. Normal appearance and bowel sounds are normal. She exhibits no distension. There is no tenderness. There is no rigidity, no rebound, no guarding, no CVA tenderness, no tenderness at McBurney's  point and  negative Murphy's sign.  Musculoskeletal: Normal range of motion.  MAE x4 Strength and sensation grossly intact Distal pulses intact Gait steady Trace to 1+ b/l pedal edema Mild tenderness diffusely throughout all extremities and torso/back, no focal bony TTP, no midline spinous process TTP, no bony stepoffs or deformities  Neurological: She is alert and oriented to person, place, and time. She has normal strength. No sensory deficit.  Skin: Skin is warm, dry and intact. No rash noted.  Psychiatric: She has a normal mood and affect.  Nursing note and vitals reviewed.    ED Treatments / Results  Labs (all labs ordered are listed, but only abnormal results are displayed) Labs Reviewed  BASIC METABOLIC PANEL - Abnormal; Notable for the following:       Result Value   Sodium 127 (*)    Chloride 91 (*)    Glucose, Bld 622 (*)    Creatinine, Ser 1.13 (*)    GFR calc non Af Amer 57 (*)    All other components within normal limits  CBC - Abnormal; Notable for the following:    RBC 5.45 (*)    MCH 25.3 (*)    All other components within normal limits  URINALYSIS, ROUTINE W REFLEX MICROSCOPIC (NOT AT [facility]) - Abnormal; Notable for the following:    Specific Gravity, Urine 1.035 (*)    Glucose, UA >1000 (*)    All other components within normal limits  URINE MICROSCOPIC-ADD ON - Abnormal; Notable for the following:    Squamous Epithelial / LPF 0-5 (*)    Bacteria, UA RARE (*)    All other components within normal limits  CBG MONITORING, ED - Abnormal; Notable for the following:    Glucose-Capillary 515 (*)    All other components within normal limits  CBG MONITORING, ED - Abnormal; Notable for the following:    Glucose-Capillary 471 (*)    All other components within normal limits  I-STAT TROPOININ, ED  I-STAT TROPOININ, ED    EKG  EKG Interpretation  Date/Time:  Wednesday February 01 2016 19:47:46 EDT Ventricular Rate:  106 PR Interval:  140 QRS Duration: 80 QT  Interval:  338 QTC Calculation: 448 R Axis:   24 Text Interpretation:  Sinus tachycardia Biatrial enlargement Abnormal ECG No significant change since last tracing except rate is faster Confirmed by WARD,  DO, KRISTEN [id number]) on 02/02/2016 12:01:32 AM       Radiology Dg Chest 2 View  Result Date: 02/01/2016 CLINICAL DATA:  Productive cough for 1 week. Pain across the top of the chest for 3 days. History of hypertension and diabetes. Former smoker. EXAM: CHEST  2 VIEW COMPARISON:  08/17/2015 FINDINGS: Normal heart size and pulmonary vascularity. No focal airspace disease or consolidation in the lungs. No blunting of costophrenic angles. No pneumothorax. Mediastinal contours appear intact. Postoperative changes in the cervical spine. Degenerative changes in thoracic spine and shoulders. IMPRESSION: No active cardiopulmonary disease. Electronically Signed   By: Lucienne Capers M.D.   On: 02/01/2016 21:19    Procedures Procedures (including critical care time)  Medications Ordered in ED Medications  fluconazole (DIFLUCAN) tablet 150 mg (not administered)  sodium chloride 0.9 % bolus 1,000 mL (1,000 mLs Intravenous New Bag/Given 02/01/16 2359)  insulin aspart (novoLOG) injection 10 Units (10 Units Subcutaneous Given 02/02/16 0000)  HYDROcodone-acetaminophen (NORCO/VICODIN) 5-325 MG per tablet 1 tablet (1 tablet Oral Given 02/02/16 0011)     Initial Impression / Assessment and Plan / ED Course  I have reviewed  the triage vital signs and the nursing notes.  Pertinent labs & imaging results that were available during my care of the patient were reviewed by me and considered in my medical decision making (see chart for details).  Clinical Course    45 y.o. female here with generalized pain all over, including in her chest, that began yesterday when she was moving furniture. No specific area of pain, just states it's all over. Some reproducible tenderness to her chest, and in all extremities,  but no focal bony TTP in extremities. NVI with soft compartments. Trace to 1+ pedal edema bilaterally. Clear lung exam. HEENT exam benign aside from mild mucosal edema and rhinorrhea. First troponin neg, will repeat now since it's been 3hrs. EKG without acute ischemic findings. CXR neg. CBC WNL. BMP with pseudohyponatremia 127 which corrects with glucose correction calculation; glucose 622, which is fairly similar to what her prior values have been. Cr 1.13, similar to prior values. No anion gap or abnormal bicarb, doubt DKA/HHS. Will get U/A to ensure no ketones, and no UTI to explain her generalized pain. Will give some fluids and novolog 10U. Will give pain meds. Doubt need for further imaging. Will reassess shortly.   12:22 AM Repeat trop neg, which is reassuring, doubt need for admission for chest pain, likely just musculoskeletal. U/A without ketones, shows yeast present, will give diflucan here x1. Repeat CBG 515 before insulin/fluids done, insulin just now given. Pt feeling slightly better although pain meds were just given. Fluids just started, will wait for these to finish/get going, then recheck CBG at that time. Overall, symptoms likely related to recent physical activity, doubt other acute emergent condition, chest pain likely musculoskeletal, 2 neg troponins is reassuring. Will give naprosyn/norco rx, discussed use of heat, discussed elevation of legs and compression stockings use to help with the mild periph edema she has. Discussed low-salt diet, and low-carb diet to help with HTN and DM2. Discussed using her home diabetes meds as directed, and f/up with her PCP to discuss adjustments on those meds that may need to be done since she's had chronically elevated sugars at every visit here. Discussed OTC meds for allergies, including flonase, netipot, mucinex, and antihistamines. F/up with PCP this week. Will reassess after repeat CBG  1:06 AM Fluids going very slowly, but repeat CBG now 471,  trending down. Will print paperwork and discharge instructions, but nursing won't d/c until pt's fluids finished. D/c with previously discussed plan. F/up with PCP this wk to discuss changes in diabetes regimen. I explained the diagnosis and have given explicit precautions to return to the ER including for any other new or worsening symptoms. The patient understands and accepts the medical plan as it's been dictated and I have answered their questions. Discharge instructions concerning home care and prescriptions have been given. The patient is STABLE and is discharged to home in good condition.   Final Clinical Impressions(s) / ED Diagnoses   Final diagnoses:  Generalized pain  Chest wall pain  Cough  Type 2 diabetes mellitus with hyperglycemia, with long-term current use of insulin (HCC)  AKI (acute kidney injury) ([place])  Other allergic rhinitis  HTN (hypertension), benign  Yeast vaginitis  Mold suspected exposure    New Prescriptions New Prescriptions   HYDROCODONE-ACETAMINOPHEN (NORCO) 5-325 MG TABLET    Take 1 tablet by mouth every 6 (six) hours as needed for severe pain.   NAPROXEN (NAPROSYN) 375 MG TABLET    Take 1 tablet (375 mg total) by mouth  2 (two) times daily as needed for mild pain, moderate pain or headache (TAKE WITH MEALS.).     Javier Mamone Camprubi-Soms, PA-C 02/02/16 0107    Forde Dandy, MD 02/02/16 0157

## 2016-02-02 DIAGNOSIS — F332 Major depressive disorder, recurrent severe without psychotic features: Secondary | ICD-10-CM | POA: Diagnosis not present

## 2016-02-02 DIAGNOSIS — R0789 Other chest pain: Secondary | ICD-10-CM | POA: Diagnosis not present

## 2016-02-02 LAB — URINALYSIS, ROUTINE W REFLEX MICROSCOPIC
Bilirubin Urine: NEGATIVE
HGB URINE DIPSTICK: NEGATIVE
KETONES UR: NEGATIVE mg/dL
Leukocytes, UA: NEGATIVE
Nitrite: NEGATIVE
PH: 5 (ref 5.0–8.0)
PROTEIN: NEGATIVE mg/dL
Specific Gravity, Urine: 1.035 — ABNORMAL HIGH (ref 1.005–1.030)

## 2016-02-02 LAB — URINE MICROSCOPIC-ADD ON
RBC / HPF: NONE SEEN RBC/hpf (ref 0–5)
WBC, UA: NONE SEEN WBC/hpf (ref 0–5)

## 2016-02-02 LAB — CBG MONITORING, ED
GLUCOSE-CAPILLARY: 458 mg/dL — AB (ref 65–99)
GLUCOSE-CAPILLARY: 301 mg/dL — AB (ref 65–99)
Glucose-Capillary: 471 mg/dL — ABNORMAL HIGH (ref 65–99)

## 2016-02-02 MED ORDER — SODIUM CHLORIDE 0.9 % IV BOLUS (SEPSIS)
1000.0000 mL | Freq: Once | INTRAVENOUS | Status: AC
  Administered 2016-02-02: 1000 mL via INTRAVENOUS

## 2016-02-02 MED ORDER — INSULIN ASPART 100 UNIT/ML ~~LOC~~ SOLN
10.0000 [IU] | Freq: Once | SUBCUTANEOUS | Status: AC
  Administered 2016-02-02: 10 [IU] via INTRAVENOUS
  Filled 2016-02-02: qty 1

## 2016-02-02 MED ORDER — NAPROXEN 375 MG PO TABS: 375.0000 mg | ORAL_TABLET | Freq: Two times a day (BID) | ORAL | 0 refills | Status: DC | PRN

## 2016-02-02 MED ORDER — HYDROCODONE-ACETAMINOPHEN 5-325 MG PO TABS: 1.0000 | ORAL_TABLET | Freq: Four times a day (QID) | ORAL | 0 refills | Status: DC | PRN

## 2016-02-02 MED ORDER — FLUCONAZOLE 100 MG PO TABS
150.0000 mg | ORAL_TABLET | Freq: Once | ORAL | Status: AC
  Administered 2016-02-02: 150 mg via ORAL
  Filled 2016-02-02: qty 2

## 2016-02-02 NOTE — Discharge Instructions (Addendum)
Your pain is likely due to the recent activities you've been doing. Use naprosyn and norco as directed as needed for pain but don't drive while taking norco. Elevate your legs and use compression stockings to help with swelling. Use heat to areas of soreness, no more than 20 minutes per hour. Use over the counter flonase or netipot to help with nasal congestion/runny nose. Use over the counter antihistamines like claritin/zyrtec/etc to help with runny nose and allergy symptoms. Use over the counter mucinex as needed for cough/congestion. Stay well hydrated with plenty of water. Use your home diabetes medications to help with your high blood sugars, but you may need to discuss this with your doctor to see if you need to have any adjustments made to your diabetes medications to help prevent such high sugars. Avoid sugary foods/drinks, and eat low-carbohydrate meals. Eat a low-salt diet as well, and take your blood pressure medications as directed by your doctor. Follow up with your doctor in 3-5 days for recheck of symptoms. Return to the ER for changes or worsening symptoms.

## 2016-02-02 NOTE — ED Provider Notes (Signed)
Medical screening examination/treatment/procedure(s) were conducted as a shared visit with non-physician practitioner(s) and myself.  I personally evaluated the patient during the encounter.   EKG Interpretation  Date/Time:  Wednesday February 01 2016 19:47:46 EDT Ventricular Rate:  106 PR Interval:  140 QRS Duration: 80 QT Interval:  338 QTC Calculation: 448 R Axis:   24 Text Interpretation:  Sinus tachycardia Biatrial enlargement Abnormal ECG No significant change since last tracing except rate is faster Confirmed by WARD,  DO, KRISTEN [id number]) on 02/02/2016 12:01:32 AM      Pt is a 45 y.o. F With history of diabetes who presents emergency department with hyperglycemia. Not in DKA. She does report she has been not eating and drinking well. Blood glucose is still 471. She is getting IV fluids and insulin. We'll monitor until blood sugar is under 300 and then discharge home when appropriate.  Have counseled her at length on the importance of diet control. She does have a PCP for follow-up.    [place], DO 02/02/16 [phone]

## 2016-02-04 ENCOUNTER — Encounter (HOSPITAL_COMMUNITY): Payer: Self-pay

## 2016-02-04 DIAGNOSIS — E1165 Type 2 diabetes mellitus with hyperglycemia: Secondary | ICD-10-CM | POA: Diagnosis not present

## 2016-02-04 DIAGNOSIS — Z79899 Other long term (current) drug therapy: Secondary | ICD-10-CM | POA: Diagnosis not present

## 2016-02-04 DIAGNOSIS — Z7984 Long term (current) use of oral hypoglycemic drugs: Secondary | ICD-10-CM | POA: Insufficient documentation

## 2016-02-04 DIAGNOSIS — I5032 Chronic diastolic (congestive) heart failure: Secondary | ICD-10-CM | POA: Insufficient documentation

## 2016-02-04 DIAGNOSIS — Z7951 Long term (current) use of inhaled steroids: Secondary | ICD-10-CM | POA: Diagnosis not present

## 2016-02-04 DIAGNOSIS — Z91040 Latex allergy status: Secondary | ICD-10-CM | POA: Insufficient documentation

## 2016-02-04 DIAGNOSIS — Z87891 Personal history of nicotine dependence: Secondary | ICD-10-CM | POA: Diagnosis not present

## 2016-02-04 DIAGNOSIS — J45901 Unspecified asthma with (acute) exacerbation: Secondary | ICD-10-CM | POA: Diagnosis not present

## 2016-02-04 DIAGNOSIS — I110 Hypertensive heart disease with heart failure: Secondary | ICD-10-CM | POA: Diagnosis not present

## 2016-02-04 DIAGNOSIS — R51 Headache: Secondary | ICD-10-CM | POA: Diagnosis present

## 2016-02-04 LAB — CBG MONITORING, ED: GLUCOSE-CAPILLARY: 499 mg/dL — AB (ref 65–99)

## 2016-02-04 NOTE — ED Triage Notes (Signed)
Patient c/o nausea and HA that began x2 days ago.  Patient states that her apartment has mold in it and not sure it it is related.  States that she began feeling worse tonight.  Patient also reports that she has not had an appetite.  Patient rates HA pain 8/10

## 2016-02-04 NOTE — ED Notes (Signed)
Pt CBG was 499.

## 2016-02-05 ENCOUNTER — Emergency Department (HOSPITAL_COMMUNITY)
Admission: EM | Admit: 2016-02-05 | Discharge: 2016-02-05 | Disposition: A | Discharge status: Home / Self Care | Payer: Medicare Other | Attending: Emergency Medicine | Admitting: Emergency Medicine

## 2016-02-05 ENCOUNTER — Telehealth (HOSPITAL_BASED_OUTPATIENT_CLINIC_OR_DEPARTMENT_OTHER): Payer: Self-pay

## 2016-02-05 DIAGNOSIS — R739 Hyperglycemia, unspecified: Secondary | ICD-10-CM

## 2016-02-05 DIAGNOSIS — E1165 Type 2 diabetes mellitus with hyperglycemia: Secondary | ICD-10-CM | POA: Diagnosis not present

## 2016-02-05 LAB — CBG MONITORING, ED
GLUCOSE-CAPILLARY: 364 mg/dL — AB (ref 65–99)
GLUCOSE-CAPILLARY: 376 mg/dL — AB (ref 65–99)
Glucose-Capillary: 345 mg/dL — ABNORMAL HIGH (ref 65–99)

## 2016-02-05 LAB — I-STAT CHEM 8, ED
BUN: 5 mg/dL — ABNORMAL LOW (ref 6–20)
CALCIUM ION: 1.2 mmol/L (ref 1.13–1.30)
Chloride: 94 mmol/L — ABNORMAL LOW (ref 101–111)
Creatinine, Ser: 0.8 mg/dL (ref 0.44–1.00)
GLUCOSE: 503 mg/dL — AB (ref 65–99)
HCT: 48 % — ABNORMAL HIGH (ref 36.0–46.0)
HEMOGLOBIN: 16.3 g/dL — AB (ref 12.0–15.0)
Potassium: 4.2 mmol/L (ref 3.5–5.1)
Sodium: 132 mmol/L — ABNORMAL LOW (ref 135–145)
TCO2: 24 mmol/L (ref 0–100)

## 2016-02-05 MED ORDER — INSULIN ASPART PROT & ASPART (70-30 MIX) 100 UNIT/ML PEN: 30.0000 [IU] | PEN_INJECTOR | Freq: Two times a day (BID) | SUBCUTANEOUS | 0 refills | Status: DC

## 2016-02-05 MED ORDER — INSULIN ASPART 100 UNIT/ML ~~LOC~~ SOLN
10.0000 [IU] | Freq: Once | SUBCUTANEOUS | Status: AC
  Administered 2016-02-05: 10 [IU] via INTRAVENOUS
  Filled 2016-02-05: qty 1

## 2016-02-05 MED ORDER — SODIUM CHLORIDE 0.9 % IV BOLUS (SEPSIS)
1000.0000 mL | Freq: Once | INTRAVENOUS | Status: AC
  Administered 2016-02-05: 1000 mL via INTRAVENOUS

## 2016-02-05 MED ORDER — INSULIN ASPART PROT & ASPART (70-30 MIX) 100 UNIT/ML ~~LOC~~ SUSP
30.0000 [IU] | Freq: Once | SUBCUTANEOUS | Status: AC
  Administered 2016-02-05: 30 [IU] via SUBCUTANEOUS
  Filled 2016-02-05: qty 10

## 2016-02-05 NOTE — ED Provider Notes (Signed)
[facility] DEPT Provider Note   CSN: [id number] Arrival date & time: 02/04/16  2320 By signing my name below, I, Georgette Shell, attest that this documentation has been prepared under the direction and in the presence of Leo Grosser, MD. Electronically Signed: Georgette Shell, ED Scribe. 02/05/16. 3:20 AM.  History   Chief Complaint Chief Complaint  Patient presents with  . Hyperglycemia  . Nausea   HPI Comments: Diana Henderson is a 45 y.o. female with h/o IDDM and HTN who presents to the Emergency Department complaining of sudden onset, constant, aching, 8/10 headache onset two days ago, worsening tonight. Pt also reports a decreased appetite and generalized weakness. She states that her apartment has mold in it and land lord have been treating it and she thinks the treatment has been making her sick. No alleviating factors tried PTA. Pt was seen in the ED two days ago for the same symptoms and had lab work done which was unremarkable. Pt is compliant with her medications. Denies fever.   The history is provided by the patient. No language interpreter was used.    Past Medical History:  Diagnosis Date  . Anxiety   . Arthritis    osteoarthritis   . Asthma    has been eval. by Dr. Gwenette Greet- last 06/2014  . Bipolar 1 disorder ([place])   . Complication of anesthesia    09/13/14: malignant HTN with inducction, case cx but no sourse identified; 11/05/14 remifentanyl infusion used to attenuate hypergynamic induction  . Depression   . Diabetes mellitus   . GERD (gastroesophageal reflux disease)   . Headache    sometimes has migraines   . Hyperlipidemia   . Hypertension   . Knee pain, chronic   . OSA on CPAP   . Pain in joint, ankle and foot 02/17/2013  . Pneumonia    hosp. for a couple of day- not sure when it was   . PTSD (post-traumatic stress disorder)   . Shortness of breath dyspnea   . Sleep apnea    uses CPAP intermittently, pt. doesn't remember where she had the study    Patient  Active Problem List   Diagnosis Date Noted  . Diabetes mellitus, insulin dependent (IDDM), uncontrolled ([place]) 09/18/2015  . AKI (acute kidney injury) ([place]) 09/18/2015  . Morbid obesity ([place]) 09/18/2015  . Asthma 09/18/2015  . Left ventricular diastolic dysfunction, NYHA class 1 09/18/2015  . Uncontrolled type 2 diabetes mellitus with complication ([place])   . Chronic diastolic CHF (congestive heart failure), NYHA class 1 ([place])   . PTSD (post-traumatic stress disorder)   . Bipolar I disorder ([place])   . Anxiety state   . Knee pain, chronic   . Cervical spondylosis with myelopathy 11/05/2014  . HTN (hypertension), malignant 09/08/2014  . HNP (herniated nucleus pulposus) with myelopathy, cervical 06/29/2014  . Deformity of metatarsal bone of right foot 06/28/2014  . CTS (carpal tunnel syndrome) 05/04/2014  . OSA (obstructive sleep apnea) 05/04/2014  . Hereditary and idiopathic peripheral neuropathy 05/04/2014  . Ganglion cyst 03/10/2014  . Vision loss, left eye 02/18/2014  . Conjunctivitis 02/17/2014  . Asthma exacerbation 01/13/2014  . Abdominal pain, unspecified site 10/28/2013  . Injury of foot, right, superficial 10/23/2013  . Tenosynovitis of foot 08/17/2013  . Pain in joint, ankle and foot 02/17/2013  . Edema of foot 02/17/2013  . Status post foot surgery 09/09/2012  . Hyperlipidemia   . 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 ANTERIOR CERVICAL DECOMP/DISCECTOMY FUSION N/A 11/05/2014   Procedure: ANTERIOR CERVICAL DECOMPRESSION/DISCECTOMY FUSION 1 LEVEL;  Surgeon: Consuella Lose, MD;  Location: [facility];  Service: Neurosurgery;  Laterality: N/A;  Cervical five- cervical seven anterior cervical decompression with fusion interbody prosthesis plating and bonegraft  . CARPAL TUNNEL RELEASE Left 2001   Left wrist  . CERVICAL LAMINECTOMY  03/31/20176   CERVICAL 4 SEVEN LAMINECTOMIES  WITH LATERAL FUSION  . Cotton Osteotomy w/ Bone Graft Right 09/30/2014   Rt foot @ [facility]  . GASTROC RECESSION EXTREMITY Right 3//20/14   Right foot  . HERNIA REPAIR     1996  . Lapidus fusion Right 01/10/12   Rt foot  . MIDTARSAL ARTHRODESIS Right 07/17/12   Rt foot  . POSTERIOR CERVICAL FUSION/FORAMINOTOMY N/A 09/16/2015   Procedure: Cervical four - seven laminectomies with lateral mass fusion;  Surgeon: Consuella Lose, MD;  Location: [facility];  Service: Neurosurgery;  Laterality: N/A;  C4-7 laminectomy with lateral mass fusion  . Remove implant deep Right 07/17/12   Rt foot  . Remove Implant Deep Right 07/22/2014   Rt foot @ [facility]  . TUBAL LIGATION      OB History    No data available       Home Medications    Prior to Admission medications   Medication Sig Start Date End Date Taking? Authorizing Provider  acetaminophen (TYLENOL) 325 MG tablet Take 2 tablets (650 mg total) by mouth every 4 (four) hours as needed (temp > 100.5). Patient taking differently: Take 650 mg by mouth every 4 (four) hours as needed for fever (temp > 100.5/ pain).  09/22/15   [name], MD  albuterol (PROVENTIL HFA;VENTOLIN HFA) 108 (90 Base) MCG/ACT inhaler Inhale 1-2 puffs into the lungs every 6 (six) hours as needed for wheezing or shortness of breath. 11/22/15   Rigoberto Noel, MD  amLODipine (NORVASC) 10 MG tablet  01/20/16   Historical Provider, MD  atenolol (TENORMIN) 50 MG tablet Take 1 tablet (50 mg total) by mouth daily. 09/10/14   Eugenie Filler, MD  budesonide-formoterol [facility]) 160-4.5 MCG/ACT inhaler Inhale 2 puffs into the lungs 2 (two) times daily. 11/22/15   Rigoberto Noel, MD  cyclobenzaprine (FLEXERIL) 10 MG tablet Take 1 tablet (10 mg total) by mouth 2 (two) times daily as needed for muscle spasms. Patient not taking: Reported on 02/01/2016 01/16/16   Montine Circle, PA-C  diazepam (VALIUM) 5 MG tablet Take 1 tablet (5 mg total) by mouth every 6 (six) hours as needed for muscle spasms.  12/01/15   Forde Dandy, MD  DULoxetine (CYMBALTA) 60 MG capsule Take 60 mg by mouth at bedtime.     Historical Provider, MD  fluticasone (FLONASE) 50 MCG/ACT nasal spray Place 1 spray into both nostrils 2 (two) times daily. Patient taking differently: Place 1 spray into both nostrils daily as needed for allergies.  04/07/15   Nona Dell, PA-C  gabapentin (NEURONTIN) 600 MG tablet Take 1 tablet (600 mg total) by mouth daily. Patient taking differently: Take 600 mg by mouth 2 (two) times daily.  09/23/15   [name], MD  HYDROcodone-acetaminophen (NORCO) 5-325 MG tablet Take 1 tablet by mouth every 6 (six) hours as needed for severe pain. 02/02/16   Mercedes Camprubi-Soms, PA-C  HYDROcodone-acetaminophen (NORCO/VICODIN) 5-325 MG tablet Take 1 tablet by mouth every 6 (six) hours as needed. Patient not taking: Reported on 01/16/2016 11/12/15   Varney Biles,  MD  HYDROcodone-acetaminophen (NORCO/VICODIN) 5-325 MG tablet Take 2 tablets by mouth every 4 (four) hours as needed. Patient not taking: Reported on 01/16/2016 12/01/15   Forde Dandy, MD  insulin aspart protamine - aspart (NOVOLOG MIX 70/30 FLEXPEN) (70-30) 100 UNIT/ML FlexPen Inject 25 Units into the skin 2 (two) times daily with a meal.    Historical Provider, MD  lactulose (CHRONULAC) 10 GM/15ML solution Take 45 mLs (30 g total) by mouth 2 (two) times daily. Patient not taking: Reported on 01/16/2016 09/22/15   [name], MD  linagliptin (TRADJENTA) 5 MG TABS tablet Take 1 tablet (5 mg total) by mouth daily with breakfast. 09/22/15   [name], MD  LYRICA 75 MG capsule Take 1 capsule by mouth 2 (two) times daily. 01/26/16   Historical Provider, MD  meloxicam (MOBIC) 7.5 MG tablet Take 1 tablet by mouth every morning. 12/26/15   Historical Provider, MD  nabumetone (RELAFEN) 500 MG tablet Take 1 tablet (500 mg total) by mouth 2 (two) times daily. 07/25/15   Myeong O Sheard, DPM  naproxen (NAPROSYN) 375 MG tablet Take 1 tablet (375  mg total) by mouth 2 (two) times daily. Patient taking differently: Take 375 mg by mouth 2 (two) times daily as needed (pain).  12/28/15   Tiffany Carlota Raspberry, PA-C  naproxen (NAPROSYN) 375 MG tablet Take 1 tablet (375 mg total) by mouth 2 (two) times daily as needed for mild pain, moderate pain or headache (TAKE WITH MEALS.). 02/02/16   Mercedes Camprubi-Soms, PA-C  naproxen (NAPROSYN) 500 MG tablet Take 1 tablet (500 mg total) by mouth 2 (two) times daily as needed for moderate pain. Patient not taking: Reported on 01/16/2016 12/01/15   Forde Dandy, MD  omeprazole (PRILOSEC) 20 MG capsule Take 20 mg by mouth daily.     Historical Provider, MD  ondansetron (ZOFRAN ODT) 8 MG disintegrating tablet Take 1 tablet (8 mg total) by mouth every 8 (eight) hours as needed for nausea or vomiting. Patient not taking: Reported on 01/16/2016 01/28/14   Sherwood Gambler, MD  oxyCODONE-acetaminophen (PERCOCET) 10-325 MG tablet Take 1 tablet by mouth every 4 (four) hours as needed for pain. 01/19/16   Myeong O Sheard, DPM  polyethylene glycol (MIRALAX / GLYCOLAX) packet Take 17 g by mouth daily as needed for mild constipation. 09/22/15   [name], MD  PRESCRIPTION MEDICATION Inhale into the lungs at bedtime. CPAP    Historical Provider, MD  tiZANidine (ZANAFLEX) 4 MG tablet Take 1 tablet (4 mg total) by mouth every 8 (eight) hours as needed for muscle spasms. 09/10/14   Eugenie Filler, MD  trimethoprim-polymyxin b Mayra Neer) ophthalmic solution Place 1 drop into the left eye every 4 (four) hours. Patient not taking: Reported on 01/16/2016 02/19/14   Donne Hazel, MD    Family History Family History  Problem Relation Age of Onset  . Kidney disease Mother   . Heart attack Father     Social History Social History  Substance Use Topics  . Smoking status: Former Smoker    Packs/day: 2.00    Years: 6.00    Types: Cigarettes    Quit date: 06/18/1978  . Smokeless tobacco: Never Used  . Alcohol use 0.0 oz/week      Comment: weekend- use of beer      Allergies   Celery oil; Pork-derived products; Latex; and Tape   Review of Systems Review of Systems  Constitutional: Positive for appetite change. Negative for fever.  Neurological: Positive for weakness  and headaches.  All other systems reviewed and are negative.    Physical Exam Updated Vital Signs BP 132/76 (BP Location: Left Arm)   Pulse 92   Temp 97.8 F (36.6 C) (Oral)   Resp 20   Ht 5\' 6"  (1.676 m)   Wt 289 lb (131.1 kg)   LMP 01/27/2016   SpO2 95%   BMI 46.65 kg/m   Physical Exam  Constitutional: She appears well-developed and well-nourished.  HENT:  Head: Normocephalic.  Eyes: Conjunctivae are normal.  Cardiovascular: Normal rate, regular rhythm and normal heart sounds.  Exam reveals no gallop and no friction rub.   No murmur heard. Pulmonary/Chest: Effort normal and breath sounds normal. No respiratory distress. She has no wheezes. She has no rales.  Abdominal: She exhibits no distension.  Musculoskeletal: Normal range of motion.  Neurological: She is alert.  Skin: Skin is warm and dry.  Psychiatric: She has a normal mood and affect. Her behavior is normal.  Nursing note and vitals reviewed.  ED Treatments / Results  DIAGNOSTIC STUDIES: Oxygen Saturation is 95% on RA, adequate by my interpretation.    COORDINATION OF CARE: 3:09 AM Discussed treatment plan with pt at bedside which includes insulin and IVF and pt agreed to plan.  Labs (all labs ordered are listed, but only abnormal results are displayed) Labs Reviewed  CBG MONITORING, ED - Abnormal; Notable for the following:       Result Value   Glucose-Capillary 499 (*)    All other components within normal limits  I-STAT CHEM 8, ED - Abnormal; Notable for the following:    Sodium 132 (*)    Chloride 94 (*)    BUN 5 (*)    Glucose, Bld 503 (*)    Hemoglobin 16.3 (*)    HCT 48.0 (*)    All other components within normal limits  CBG MONITORING, ED -  Abnormal; Notable for the following:    Glucose-Capillary 364 (*)    All other components within normal limits  CBG MONITORING, ED - Abnormal; Notable for the following:    Glucose-Capillary 345 (*)    All other components within normal limits  CBG MONITORING, ED - Abnormal; Notable for the following:    Glucose-Capillary 376 (*)    All other components within normal limits    EKG  EKG Interpretation None       Radiology No results found.  Procedures Procedures (including critical care time)  Medications Ordered in ED Medications  insulin aspart (novoLOG) injection 10 Units (10 Units Intravenous Given 02/05/16 0350)  sodium chloride 0.9 % bolus 1,000 mL (0 mLs Intravenous Stopped 02/05/16 0543)    And  sodium chloride 0.9 % bolus 1,000 mL (0 mLs Intravenous Stopped 02/05/16 0543)  insulin aspart (novoLOG) injection 10 Units (10 Units Intravenous Given 02/05/16 0455)  insulin aspart protamine- aspart (NOVOLOG MIX 70/30) injection 30 Units (30 Units Subcutaneous Given 02/05/16 0730)     Initial Impression / Assessment and Plan / ED Course  I have reviewed the triage vital signs and the nursing notes.  Pertinent labs & imaging results that were available during my care of the patient were reviewed by me and considered in my medical decision making (see chart for details).  Clinical Course    45 y.o. female presents with Ongoing feeling of weakness and headache starting 2 days ago. She has had poor appetite and has been laying in her bed all day only going back and forth to the bathroom.  She had hyperglycemia last night was seen in the emergency department which was corrected with IV insulin but then did not have any physical activity and has not had any changes to her insulin dosing. Her glucose on arrival was 500 which is likely the cause of her symptoms. She was screened again for DKA and did not have evidence of that on lab examination. She is otherwise well-appearing with no  new complaints. She was given 2 doses of 10 units of IV insulin and 2 L of IV fluid and maintain multiple blood sugars that remained in the 300s. Given her refractory diabetes and lack of exercise and feel that it is appropriate to go up on her home dosing. She was given 30 units subcutaneous insulin prior to discharge with her home dose at 25 currently and will up her twice a day dosing to 30 units and monitor her blood sugar. She needs to follow-up with with her primary care physician to determine the need for Lantus or another combination of insulin that may be more effective. She was encouraged heavily to consider at least light activity if not an exercise regimen to help decrease her insulin dependence.  Final Clinical Impressions(s) / ED Diagnoses   Final diagnoses:  Hyperglycemia without ketosis    New Prescriptions Discharge Medication List as of 02/05/2016  7:14 AM    I personally performed the services described in this documentation, which was scribed in my presence. The recorded information has been reviewed and is accurate.      Leo Grosser, MD 02/06/16 [phone]

## 2016-02-05 NOTE — ED Notes (Signed)
EDP at bedside  

## 2016-02-07 ENCOUNTER — Encounter: Payer: Self-pay | Admitting: Podiatry

## 2016-02-07 ENCOUNTER — Ambulatory Visit (INDEPENDENT_AMBULATORY_CARE_PROVIDER_SITE_OTHER): Payer: Medicare Other | Admitting: Podiatry

## 2016-02-07 ENCOUNTER — Encounter: Payer: Medicare Other | Attending: Internal Medicine | Admitting: Skilled Nursing Facility1

## 2016-02-07 DIAGNOSIS — M21961 Unspecified acquired deformity of right lower leg: Secondary | ICD-10-CM

## 2016-02-07 DIAGNOSIS — M25579 Pain in unspecified ankle and joints of unspecified foot: Secondary | ICD-10-CM

## 2016-02-07 DIAGNOSIS — E119 Type 2 diabetes mellitus without complications: Secondary | ICD-10-CM | POA: Insufficient documentation

## 2016-02-07 DIAGNOSIS — M659 Synovitis and tenosynovitis, unspecified: Secondary | ICD-10-CM

## 2016-02-07 DIAGNOSIS — E669 Obesity, unspecified: Secondary | ICD-10-CM | POA: Insufficient documentation

## 2016-02-07 MED ORDER — OXYCODONE-ACETAMINOPHEN 10-325 MG PO TABS: 1.0000 | ORAL_TABLET | ORAL | 0 refills | Status: DC | PRN

## 2016-02-07 NOTE — Progress Notes (Signed)
Subjective:  45 year old female presents complaining of right foot pain and request for pain medication.  No new changes since her last visit.   Recent History: She was in ER for this foot pain on 12/27/15. They examined and gave her Naproxen.  For the past 3 weeks, her left foot hurts more than the right.  Prior to this left foot pain she was seeing at this office monthly for her right foot pain and was scheduled to have right foot surgery, explore and stabilize mid foot if needed. The surgery was cancelled for now due to left foot pain. Left hip area is hurting with Sciatic nerve problem for the past 3 weeks. She had to take extra pain medication. She was diagnosed with Lumbar Radiculopathy.   She has had neck surgery on 09/16/15 and was discharged on 09/23/15.   Objective: Dermatologic: All nails are thick and hypertrophic.  Vascular status are within normal. Neurologic: Pain in dorsum and ankle with ambulation left foot severe  Unable to place full weight on left foot and getting around in tip toes and very painful.  Orthopedic: Positive of elevated first ray with forefoot varus right. Tight Achilles tendon bilateral.   Assessment: 1. Pain in left midfoot and ankle. 2. Difficulty walking and frequent fall.  3. Osteoarthropathy foot and ankle bilateral. 4. Faulty biomechanics bilateral.  5. Ankle Equinus bilateral.  6. Diabetic under control. 7. Weakness left arm. 8. Chronic knee pain. 9. Onychomycosis x 10. 10. Lumbar Radiculopathy.  Plan: Reviewed clinical findings and available treatment options. Advised to cut down on pain medication to two times daily if needed. Continue stay in CAM walker.  Continue with Naproxen. Pain medication if needed. Return in 2 weeks.

## 2016-02-07 NOTE — Patient Instructions (Signed)
Seen for pain in right foot. Pain medication re ordered as per request.

## 2016-02-09 ENCOUNTER — Ambulatory Visit: Payer: Medicare Other | Admitting: Podiatry

## 2016-02-15 DIAGNOSIS — F332 Major depressive disorder, recurrent severe without psychotic features: Secondary | ICD-10-CM | POA: Diagnosis not present

## 2016-02-21 ENCOUNTER — Encounter: Payer: Self-pay | Admitting: Podiatry

## 2016-02-21 ENCOUNTER — Ambulatory Visit (INDEPENDENT_AMBULATORY_CARE_PROVIDER_SITE_OTHER): Payer: Medicare Other | Admitting: Podiatry

## 2016-02-21 DIAGNOSIS — B351 Tinea unguium: Secondary | ICD-10-CM

## 2016-02-21 DIAGNOSIS — R262 Difficulty in walking, not elsewhere classified: Secondary | ICD-10-CM | POA: Diagnosis not present

## 2016-02-21 DIAGNOSIS — M659 Synovitis and tenosynovitis, unspecified: Secondary | ICD-10-CM

## 2016-02-21 MED ORDER — OXYCODONE-ACETAMINOPHEN 10-325 MG PO TABS: 1.0000 | ORAL_TABLET | Freq: Four times a day (QID) | ORAL | 0 refills | Status: DC | PRN

## 2016-02-21 NOTE — Patient Instructions (Signed)
Seen for hypertrophic nails and painful right foot. All nails debrided. Pain medication prescribed. Return as needed.

## 2016-02-21 NOTE — Progress Notes (Signed)
Subjective:  45 year old female presents wearing tennis shoes complaining of pain in left foot and request for pain medication. Also requesting toe nails trimmed.   Recent History: She was in ER for the left foot pain on 12/27/15. They examined and gave her Naproxen.  Her left foot has been hurting  more than the right.  Prior to this left foot pain she was seeing at this office monthly for her right foot pain and was scheduled to have right foot surgery, explore and stabilize mid foot if needed. The surgery was cancelled for now due to left foot pain. Left hip area is hurting with Sciatic nerve problem for the past 3 weeks. She had to take extra pain medication. She was diagnosed with Lumbar Radiculopathy.   She has had neck surgery on 09/16/15 and was discharged on 09/23/15.   Objective: Dermatologic: All nails are thick and hypertrophic.  Vascular status are within normal. Neurologic: Pain in dorsum and ankle with ambulation left foot severe  Unable to place full weight on left foot and getting around in tip toes and very painful.  Orthopedic: Positive of elevated first ray with forefoot varus right. Tight Achilles tendon bilateral.   Assessment: 1. Pain in left midfoot and ankle. 2. Difficulty walking and frequent fall.  3. Osteoarthropathy foot and ankle bilateral. 4. Faulty biomechanics bilateral.  5. Ankle Equinus bilateral.  6. Diabetic under control. 7. Weakness left arm. 8. Chronic knee pain. 9. Onychomycosis x 10. 10. Lumbar Radiculopathy.  Plan: Reviewed clinical findings and available treatment options. Advised to cut down on pain medication to two times daily if needed. Continue stay in CAM walker as needed. All nails debrided. Return as needed.

## 2016-02-29 ENCOUNTER — Ambulatory Visit (INDEPENDENT_AMBULATORY_CARE_PROVIDER_SITE_OTHER): Payer: Medicare Other | Admitting: Podiatry

## 2016-02-29 ENCOUNTER — Encounter: Payer: Self-pay | Admitting: Podiatry

## 2016-02-29 DIAGNOSIS — M21961 Unspecified acquired deformity of right lower leg: Secondary | ICD-10-CM

## 2016-02-29 DIAGNOSIS — M659 Synovitis and tenosynovitis, unspecified: Secondary | ICD-10-CM | POA: Diagnosis not present

## 2016-02-29 DIAGNOSIS — R262 Difficulty in walking, not elsewhere classified: Secondary | ICD-10-CM

## 2016-02-29 MED ORDER — OXYCODONE-ACETAMINOPHEN 10-325 MG PO TABS: 1.0000 | ORAL_TABLET | Freq: Four times a day (QID) | ORAL | 0 refills | Status: DC | PRN

## 2016-02-29 NOTE — Patient Instructions (Signed)
Seen for bilateral foot pain due to arthritic joints. May continue current level of care. Return as needed.

## 2016-02-29 NOTE — Progress Notes (Signed)
Subjective:  45 year old female presents complaining of pain in both feet anterior ankle area for over a week. Pain is non stop. Having bad weather makes it worse. Pain is keeping her awake at night unable to fall to sleep.  She noted of swelling in right ankle area. Patient walks with a cane.  Recent History: She was in ER for the left foot pain on 12/27/15. They examined and gave her Naproxen.  Her left foot has been hurting  more than the right.  Prior to this left foot pain she was seeing at this office monthly for her right foot pain and was scheduled to have right foot surgery, explore and stabilize mid foot if needed. The surgery was cancelled for now due to left foot pain. Left hip area is hurting with Sciatic nerve problem for the past 3 weeks. She had to take extra pain medication. She was diagnosed with Lumbar Radiculopathy.   She has had neck surgery on 09/16/15 and was discharged on 09/23/15.   Objective: No changes in findings since last visit. Dermatologic: All nails are thick and hypertrophic.  Vascular status are within normal. Neurologic: Pain in dorsum and ankle with ambulation bilateral.  Unable to place full weight on left foot and getting around in tip toes and very painful.  Orthopedic: Positive of elevated first ray with forefoot varus right. Tight Achilles tendon bilateral.   Assessment: 1. Pain in ankle and mid foot bilateral. 2. Difficulty walking and frequent fall.  3. Osteoarthropathy foot and ankle bilateral. 4. Faulty biomechanics bilateral.  5. Ankle Equinus bilateral.  6. Diabetic under control. 7. Weakness left arm. 8. Chronic knee pain. 9. Onychomycosis x 10. 10. Lumbar Radiculopathy.  Plan: Reviewed clinical findings and available treatment options. Discussed the previous surgical intervention failed to improve her right foot pain. May do the same on the left foot.  Advised to feel free to seek second opinion for bilateral foot  pain. Return as needed.

## 2016-03-02 DIAGNOSIS — F332 Major depressive disorder, recurrent severe without psychotic features: Secondary | ICD-10-CM | POA: Diagnosis not present

## 2016-03-04 ENCOUNTER — Encounter (HOSPITAL_COMMUNITY): Payer: Self-pay | Admitting: Emergency Medicine

## 2016-03-04 ENCOUNTER — Ambulatory Visit (HOSPITAL_COMMUNITY)
Admission: EM | Admit: 2016-03-04 | Discharge: 2016-03-04 | Disposition: A | Discharge status: Home / Self Care | Payer: Medicare Other | Attending: Family Medicine | Admitting: Family Medicine

## 2016-03-04 DIAGNOSIS — G8929 Other chronic pain: Secondary | ICD-10-CM | POA: Diagnosis not present

## 2016-03-04 MED ORDER — IBUPROFEN 800 MG PO TABS: 800.0000 mg | ORAL_TABLET | Freq: Three times a day (TID) | ORAL | 0 refills | Status: DC

## 2016-03-04 NOTE — ED Provider Notes (Signed)
CSN: [id number]     Arrival date & time 03/04/16  1647 History   First MD Initiated Contact with Patient 03/04/16 1746     Chief Complaint  Patient presents with  . Generalized Body Aches  . Neck Pain   (Consider location/radiation/quality/duration/timing/severity/associated sxs/prior Treatment) Diana Henderson is a 45 y.o well-appearing female with history of OA, Diana Henderson is using a cane, presents today for pain. Diana Henderson reports pain "everywhere". Diana Henderson reports pain on her neck, her back, her arms, both sides of her abdomen, her legs, and across her chest. Diana Henderson reports these pain for 3 weeks. Diana Henderson reports to be pain-free 3 weeks ago. Diana Henderson states to the nursing staff that Diana Henderson ran out of her percocet. Diana Henderson reports that Diana Henderson recently had a foot surgery and was prescribed percocet. Patient reports the percocet helps. Diana Henderson reports everything makes her pain worst. Diana Henderson rates her pain as 1/10 constant.       Past Medical History:  Diagnosis Date  . Anxiety   . Arthritis    osteoarthritis   . Asthma    has been eval. by Dr. Gwenette Greet- last 06/2014  . Bipolar 1 disorder ([place])   . Complication of anesthesia    09/13/14: malignant HTN with inducction, case cx but no sourse identified; 11/05/14 remifentanyl infusion used to attenuate hypergynamic induction  . Depression   . Diabetes mellitus   . GERD (gastroesophageal reflux disease)   . Headache    sometimes has migraines   . Hyperlipidemia   . Hypertension   . Knee pain, chronic   . OSA on CPAP   . Pain in joint, ankle and foot 02/17/2013  . Pneumonia    hosp. for a couple of day- not sure when it was   . PTSD (post-traumatic stress disorder)   . Shortness of breath dyspnea   . Sleep apnea    uses CPAP intermittently, pt. doesn't remember where Diana Henderson had the study   Past Surgical History:  Procedure Laterality Date  . ANTERIOR CERVICAL DECOMP/DISCECTOMY FUSION N/A 11/05/2014   Procedure: ANTERIOR CERVICAL DECOMPRESSION/DISCECTOMY FUSION 1 LEVEL;  Surgeon:  Consuella Lose, MD;  Location: [facility];  Service: Neurosurgery;  Laterality: N/A;  Cervical five- cervical seven anterior cervical decompression with fusion interbody prosthesis plating and bonegraft  . CARPAL TUNNEL RELEASE Left 2001   Left wrist  . CERVICAL LAMINECTOMY  03/31/20176   CERVICAL 4 SEVEN LAMINECTOMIES WITH LATERAL FUSION  . Cotton Osteotomy w/ Bone Graft Right 09/30/2014   Rt foot @ [facility]  . GASTROC RECESSION EXTREMITY Right 3//20/14   Right foot  . HERNIA REPAIR     1996  . Lapidus fusion Right 01/10/12   Rt foot  . MIDTARSAL ARTHRODESIS Right 07/17/12   Rt foot  . POSTERIOR CERVICAL FUSION/FORAMINOTOMY N/A 09/16/2015   Procedure: Cervical four - seven laminectomies with lateral mass fusion;  Surgeon: Consuella Lose, MD;  Location: [place] [facility];  Service: Neurosurgery;  Laterality: N/A;  C4-7 laminectomy with lateral mass fusion  . Remove implant deep Right 07/17/12   Rt foot  . Remove Implant Deep Right 07/22/2014   Rt foot @ [facility]  . TUBAL LIGATION     Family History  Problem Relation Age of Onset  . Kidney disease Mother   . Heart attack Father    Social History  Substance Use Topics  . Smoking status: Former Smoker    Packs/day: 2.00    Years: 6.00    Types: Cigarettes    Quit date: 06/18/1978  .  Smokeless tobacco: Never Used  . Alcohol use 0.0 oz/week     Comment: weekend- use of beer    OB History    No data available     Review of Systems  Constitutional: Negative for chills, fatigue and fever.  Respiratory: Negative for cough and shortness of breath.   Cardiovascular: Positive for chest pain. Negative for palpitations and leg swelling.  Gastrointestinal: Positive for abdominal pain. Negative for diarrhea, nausea and vomiting.  Musculoskeletal: Positive for arthralgias, back pain, gait problem, myalgias and neck pain. Negative for joint swelling and neck stiffness.       +uses cane   Skin: Negative for rash.    Allergies  Celery oil;  Pork-derived products; Latex; and Tape  Home Medications   Prior to Admission medications   Medication Sig Start Date End Date Taking? Authorizing Provider  acetaminophen (TYLENOL) 325 MG tablet Take 2 tablets (650 mg total) by mouth every 4 (four) hours as needed (temp > 100.5). 09/22/15  Yes [name], MD  albuterol (PROVENTIL HFA;VENTOLIN HFA) 108 (90 Base) MCG/ACT inhaler Inhale 1-2 puffs into the lungs every 6 (six) hours as needed for wheezing or shortness of breath. 11/22/15  Yes Rigoberto Noel, MD  amLODipine (NORVASC) 10 MG tablet Take 10 mg by mouth daily.  01/20/16  Yes Historical Provider, MD  atenolol (TENORMIN) 50 MG tablet Take 1 tablet (50 mg total) by mouth daily. 09/10/14  Yes Eugenie Filler, MD  budesonide-formoterol [facility]) 160-4.5 MCG/ACT inhaler Inhale 2 puffs into the lungs 2 (two) times daily. 11/22/15  Yes Rigoberto Noel, MD  cyclobenzaprine (FLEXERIL) 10 MG tablet Take 1 tablet (10 mg total) by mouth 2 (two) times daily as needed for muscle spasms. 01/16/16  Yes Montine Circle, PA-C  diazepam (VALIUM) 5 MG tablet Take 1 tablet (5 mg total) by mouth every 6 (six) hours as needed for muscle spasms. 12/01/15  Yes Forde Dandy, MD  DULoxetine (CYMBALTA) 60 MG capsule Take 60 mg by mouth at bedtime.    Yes Historical Provider, MD  fluticasone (FLONASE) 50 MCG/ACT nasal spray Place 1 spray into both nostrils 2 (two) times daily. Patient taking differently: Place 1 spray into both nostrils daily as needed for allergies.  04/07/15  Yes Nona Dell, PA-C  gabapentin (NEURONTIN) 600 MG tablet Take 1 tablet (600 mg total) by mouth daily. Patient taking differently: Take 600 mg by mouth 2 (two) times daily.  09/23/15  Yes [name], MD  insulin aspart protamine - aspart (NOVOLOG MIX 70/30 FLEXPEN) (70-30) 100 UNIT/ML FlexPen Inject 0.3 mLs (30 Units total) into the skin 2 (two) times daily with a meal. 02/05/16  Yes Leo Grosser, MD  linagliptin (TRADJENTA) 5 MG TABS  tablet Take 1 tablet (5 mg total) by mouth daily with breakfast. 09/22/15  Yes [name], MD  LYRICA 75 MG capsule Take 1 capsule by mouth 2 (two) times daily. 01/26/16  Yes Historical Provider, MD  meloxicam (MOBIC) 7.5 MG tablet Take 1 tablet by mouth every morning. 12/26/15  Yes Historical Provider, MD  nabumetone (RELAFEN) 500 MG tablet Take 1 tablet (500 mg total) by mouth 2 (two) times daily. 07/25/15  Yes Myeong O Sheard, DPM  naproxen (NAPROSYN) 375 MG tablet Take 1 tablet (375 mg total) by mouth 2 (two) times daily. 12/28/15  Yes Tiffany Carlota Raspberry, PA-C  omeprazole (PRILOSEC) 20 MG capsule Take 20 mg by mouth daily.    Yes Historical Provider, MD  ondansetron (ZOFRAN ODT) 8 MG  disintegrating tablet Take 1 tablet (8 mg total) by mouth every 8 (eight) hours as needed for nausea or vomiting. 01/28/14  Yes Sherwood Gambler, MD  polyethylene glycol (MIRALAX / GLYCOLAX) packet Take 17 g by mouth daily as needed for mild constipation. 09/22/15  Yes [name], MD  tiZANidine (ZANAFLEX) 4 MG tablet Take 1 tablet (4 mg total) by mouth every 8 (eight) hours as needed for muscle spasms. 09/10/14  Yes Eugenie Filler, MD  HYDROcodone-acetaminophen (NORCO) 5-325 MG tablet Take 1 tablet by mouth every 6 (six) hours as needed for severe pain. 02/02/16   Mercedes Camprubi-Soms, PA-C  HYDROcodone-acetaminophen (NORCO/VICODIN) 5-325 MG tablet Take 1 tablet by mouth every 6 (six) hours as needed. 11/12/15   Varney Biles, MD  HYDROcodone-acetaminophen (NORCO/VICODIN) 5-325 MG tablet Take 2 tablets by mouth every 4 (four) hours as needed. 12/01/15   Forde Dandy, MD  ibuprofen (ADVIL,MOTRIN) 800 MG tablet Take 1 tablet (800 mg total) by mouth 3 (three) times daily. 03/04/16 03/09/16  Barry Dienes, NP  lactulose (CHRONULAC) 10 GM/15ML solution Take 45 mLs (30 g total) by mouth 2 (two) times daily. 09/22/15   [name], MD  naproxen (NAPROSYN) 375 MG tablet Take 1 tablet (375 mg total) by mouth 2 (two) times daily as  needed for mild pain, moderate pain or headache (TAKE WITH MEALS.). 02/02/16   Mercedes Camprubi-Soms, PA-C  naproxen (NAPROSYN) 500 MG tablet Take 1 tablet (500 mg total) by mouth 2 (two) times daily as needed for moderate pain. 12/01/15   Forde Dandy, MD  oxyCODONE-acetaminophen (PERCOCET) 10-325 MG tablet Take 1 tablet by mouth every 6 (six) hours as needed for pain. 02/29/16   Myeong Roxine Caddy, DPM  trimethoprim-polymyxin b (POLYTRIM) ophthalmic solution Place 1 drop into the left eye every 4 (four) hours. 02/19/14   Donne Hazel, MD   Meds Ordered and Administered this Visit  Medications - No data to display  BP 158/91 (BP Location: Left Arm)   Pulse 104   Temp 97.3 F (36.3 C) (Oral)   Resp 22  No data found.   Physical Exam  Constitutional: Diana Henderson is oriented to person, place, and time. Diana Henderson appears well-developed and well-nourished.  HENT:  Head: Normocephalic and atraumatic.  Eyes: Pupils are equal, round, and reactive to light.  Neck: Normal range of motion.  Cardiovascular: Normal rate, regular rhythm, normal heart sounds and intact distal pulses.   No murmur heard. Chest very tender on palpation  Pulmonary/Chest: Effort normal and breath sounds normal. No respiratory distress.  Abdominal: Soft. Bowel sounds are normal. Diana Henderson exhibits no distension. There is no tenderness.  Musculoskeletal: Normal range of motion. Diana Henderson exhibits tenderness. Diana Henderson exhibits no edema or deformity.  Patient has tenderness on palpation almost all body surfaces including the neck, entire back, legs, arms, shoulders, chest. Diana Henderson exhibits right knee crepitus. Diana Henderson has full ROM. Diana Henderson has no deformity  Neurological: Diana Henderson is alert and oriented to person, place, and time.  Skin: Skin is warm and dry.  Nursing note and vitals reviewed.   Urgent Care Course   Clinical Course    Procedures (including critical care time)  Labs Review Labs Reviewed - No data to display  Imaging Review No results  found.    MDM   1. Chronic pain    Patient informed that I cannot prescribed her any controlled substances pain medication because Diana Henderson recently got 18-day supply of percocet (78 tabs) from another provider and Diana Henderson should still have some  of the percocet left.  Patient then states that Diana Henderson does still have some percocet at home. I suggested patient to f/u with her PCP to suggest possible pain management referral, patient then states that Diana Henderson has pain clinic scheduled on 09/26. RX for ibuprofen 800mg  TID with food given; Patient is okay with this. Discharge instruction given. Patient will f/u with her PCP and pain management as scheduled.     Barry Dienes, NP 03/04/16 1805

## 2016-03-04 NOTE — Discharge Instructions (Signed)
Take ibuprofen 800 mg three times a day with food for your pain. You may continue your percocet if pain becomes severe. Please go to you pain management appt at the end of this month. Follow up with your family doctor for today's visit.

## 2016-03-04 NOTE — ED Triage Notes (Signed)
Pt reports back/neck pain onset 3 weeks.... Hx of cervical surgery  States she has run out of Percocet's and does not have an appt w/gen surgeon until November  Use of cane on right hand present.... Slow gait... A&O x4... NAD

## 2016-03-08 ENCOUNTER — Other Ambulatory Visit: Payer: Self-pay | Admitting: Adult Health

## 2016-03-09 ENCOUNTER — Telehealth: Payer: Self-pay | Admitting: *Deleted

## 2016-03-09 MED ORDER — IBUPROFEN 800 MG PO TABS
800.0000 mg | ORAL_TABLET | Freq: Three times a day (TID) | ORAL | 1 refills | Status: DC | PRN
Start: 2016-03-09 — End: 2016-04-19

## 2016-03-09 NOTE — Telephone Encounter (Signed)
03/09/2016 Rx Request from Pharmacy

## 2016-03-13 ENCOUNTER — Ambulatory Visit (INDEPENDENT_AMBULATORY_CARE_PROVIDER_SITE_OTHER): Payer: Medicare Other | Admitting: Podiatry

## 2016-03-13 ENCOUNTER — Encounter: Payer: Self-pay | Admitting: Podiatry

## 2016-03-13 DIAGNOSIS — R262 Difficulty in walking, not elsewhere classified: Secondary | ICD-10-CM

## 2016-03-13 DIAGNOSIS — M659 Synovitis and tenosynovitis, unspecified: Secondary | ICD-10-CM | POA: Diagnosis not present

## 2016-03-13 DIAGNOSIS — F332 Major depressive disorder, recurrent severe without psychotic features: Secondary | ICD-10-CM | POA: Diagnosis not present

## 2016-03-13 MED ORDER — OXYCODONE-ACETAMINOPHEN 10-325 MG PO TABS: 1.0000 | ORAL_TABLET | Freq: Four times a day (QID) | ORAL | 0 refills | Status: DC | PRN

## 2016-03-13 NOTE — Progress Notes (Signed)
Subjective:  45 year old female presents complaining of throbbing pain in both feet anterior ankle area with ambulation. Walking down to mail box, about 10 yard away, is enough to cause the foot pain.  She feels the feet are buckling at the ankle even though they are standing straight. Patient wears in depth shoes and walks with a cane.  Recent History: She was in ER for the left foot pain on 12/27/15. They examined and gave her Naproxen.  Herleft foot has been hurting more than the right.  Prior to this left foot pain she was seeing at this office monthly for her right foot pain and was scheduled to have right foot surgery, explore and stabilize mid foot if needed. The surgery was cancelled for now due to left foot pain. Left hip area is hurting with Sciatic nerve problem for the past 3 weeks. She had to take extra pain medication. She was diagnosed with Lumbar Radiculopathy.   She has had neck surgery on 09/16/15 and was discharged on 09/23/15.   Objective: No changes in findings since last visit. Dermatologic: All nails are thick and hypertrophic.  Vascular status are within normal. Neurologic: Pain in dorsum and ankle with ambulation bilateral.  Unable to place full weight on left foot and getting around in tip toes and very painful.  Orthopedic: Positive of elevated first ray with forefoot varus right. Tight Achilles tendon bilateral.   Assessment: No change in assessment.  1. Repeated right mid foot surgery, Lapidus fusion, Cotton osteotomy, repeat fusion, removal of hardware.  2. Chronic foot and ankle pain bilateral. 3. Difficulty walking and frequent fall.  4. Osteoarthropathy foot and ankle bilateral. 5. Ankle Equinus with faulty biomechanics bilateral.  6. Diabetic under control. 7. Weakness left arm. 8. Chronic knee pain. 9. Lumbar Radiculopathy.  Plan: Reviewed clinical findings and available treatment options. Previously discussed the previous  surgical intervention failed to improve her right foot pain. May do the same on the left foot.  Prescription re ordered as per request. Return as needed.

## 2016-03-13 NOTE — Patient Instructions (Signed)
Seen for painful feet. Pain medication prescribed as per request. Return as needed.

## 2016-03-14 DIAGNOSIS — E0865 Diabetes mellitus due to underlying condition with hyperglycemia: Secondary | ICD-10-CM | POA: Diagnosis not present

## 2016-03-14 DIAGNOSIS — H2511 Age-related nuclear cataract, right eye: Secondary | ICD-10-CM | POA: Diagnosis not present

## 2016-03-14 DIAGNOSIS — H53009 Unspecified amblyopia, unspecified eye: Secondary | ICD-10-CM | POA: Diagnosis not present

## 2016-03-14 DIAGNOSIS — H538 Other visual disturbances: Secondary | ICD-10-CM | POA: Diagnosis not present

## 2016-03-22 DIAGNOSIS — F332 Major depressive disorder, recurrent severe without psychotic features: Secondary | ICD-10-CM | POA: Diagnosis not present

## 2016-03-26 DIAGNOSIS — M19079 Primary osteoarthritis, unspecified ankle and foot: Secondary | ICD-10-CM | POA: Diagnosis not present

## 2016-03-26 DIAGNOSIS — G4733 Obstructive sleep apnea (adult) (pediatric): Secondary | ICD-10-CM | POA: Diagnosis not present

## 2016-03-26 DIAGNOSIS — I119 Hypertensive heart disease without heart failure: Secondary | ICD-10-CM | POA: Diagnosis not present

## 2016-03-26 DIAGNOSIS — J45909 Unspecified asthma, uncomplicated: Secondary | ICD-10-CM | POA: Diagnosis not present

## 2016-03-26 DIAGNOSIS — E119 Type 2 diabetes mellitus without complications: Secondary | ICD-10-CM | POA: Diagnosis not present

## 2016-03-26 DIAGNOSIS — E669 Obesity, unspecified: Secondary | ICD-10-CM | POA: Diagnosis not present

## 2016-03-26 DIAGNOSIS — G629 Polyneuropathy, unspecified: Secondary | ICD-10-CM | POA: Diagnosis not present

## 2016-03-26 DIAGNOSIS — I10 Essential (primary) hypertension: Secondary | ICD-10-CM | POA: Diagnosis not present

## 2016-03-29 ENCOUNTER — Ambulatory Visit (HOSPITAL_COMMUNITY)
Admission: EM | Admit: 2016-03-29 | Discharge: 2016-03-29 | Disposition: A | Discharge status: Nursing Home (Includes ICF) | Payer: Medicare Other | Attending: Internal Medicine | Admitting: Internal Medicine

## 2016-03-29 ENCOUNTER — Ambulatory Visit: Payer: Medicare Other | Admitting: Skilled Nursing Facility1

## 2016-03-29 ENCOUNTER — Emergency Department (HOSPITAL_COMMUNITY): Payer: Medicare Other

## 2016-03-29 ENCOUNTER — Encounter (HOSPITAL_COMMUNITY): Payer: Self-pay | Admitting: Emergency Medicine

## 2016-03-29 ENCOUNTER — Emergency Department (HOSPITAL_COMMUNITY)
Admission: EM | Admit: 2016-03-29 | Discharge: 2016-03-29 | Disposition: A | Discharge status: Home / Self Care | Payer: Medicare Other | Attending: Emergency Medicine | Admitting: Emergency Medicine

## 2016-03-29 DIAGNOSIS — Z79899 Other long term (current) drug therapy: Secondary | ICD-10-CM | POA: Diagnosis not present

## 2016-03-29 DIAGNOSIS — E1165 Type 2 diabetes mellitus with hyperglycemia: Secondary | ICD-10-CM | POA: Diagnosis not present

## 2016-03-29 DIAGNOSIS — Z794 Long term (current) use of insulin: Secondary | ICD-10-CM | POA: Insufficient documentation

## 2016-03-29 DIAGNOSIS — Z91040 Latex allergy status: Secondary | ICD-10-CM | POA: Diagnosis not present

## 2016-03-29 DIAGNOSIS — R739 Hyperglycemia, unspecified: Secondary | ICD-10-CM

## 2016-03-29 DIAGNOSIS — Z87891 Personal history of nicotine dependence: Secondary | ICD-10-CM | POA: Insufficient documentation

## 2016-03-29 DIAGNOSIS — J45909 Unspecified asthma, uncomplicated: Secondary | ICD-10-CM | POA: Insufficient documentation

## 2016-03-29 DIAGNOSIS — Z7984 Long term (current) use of oral hypoglycemic drugs: Secondary | ICD-10-CM | POA: Insufficient documentation

## 2016-03-29 DIAGNOSIS — I110 Hypertensive heart disease with heart failure: Secondary | ICD-10-CM | POA: Insufficient documentation

## 2016-03-29 DIAGNOSIS — E1169 Type 2 diabetes mellitus with other specified complication: Secondary | ICD-10-CM | POA: Insufficient documentation

## 2016-03-29 DIAGNOSIS — R0789 Other chest pain: Secondary | ICD-10-CM | POA: Diagnosis not present

## 2016-03-29 DIAGNOSIS — R079 Chest pain, unspecified: Secondary | ICD-10-CM | POA: Diagnosis not present

## 2016-03-29 DIAGNOSIS — R072 Precordial pain: Secondary | ICD-10-CM

## 2016-03-29 DIAGNOSIS — R05 Cough: Secondary | ICD-10-CM | POA: Diagnosis not present

## 2016-03-29 DIAGNOSIS — I5032 Chronic diastolic (congestive) heart failure: Secondary | ICD-10-CM | POA: Diagnosis not present

## 2016-03-29 LAB — URINE MICROSCOPIC-ADD ON

## 2016-03-29 LAB — POCT I-STAT, CHEM 8
BUN: 6 mg/dL (ref 6–20)
CHLORIDE: 95 mmol/L — AB (ref 101–111)
CREATININE: 0.9 mg/dL (ref 0.44–1.00)
Calcium, Ion: 1.13 mmol/L — ABNORMAL LOW (ref 1.15–1.40)
GLUCOSE: 491 mg/dL — AB (ref 65–99)
HCT: 50 % — ABNORMAL HIGH (ref 36.0–46.0)
Hemoglobin: 17 g/dL — ABNORMAL HIGH (ref 12.0–15.0)
POTASSIUM: 4.3 mmol/L (ref 3.5–5.1)
Sodium: 134 mmol/L — ABNORMAL LOW (ref 135–145)
TCO2: 28 mmol/L (ref 0–100)

## 2016-03-29 LAB — BASIC METABOLIC PANEL WITH GFR
Anion gap: 8 (ref 5–15)
BUN: 5 mg/dL — ABNORMAL LOW (ref 6–20)
CO2: 27 mmol/L (ref 22–32)
Calcium: 9.2 mg/dL (ref 8.9–10.3)
Chloride: 97 mmol/L — ABNORMAL LOW (ref 101–111)
Creatinine, Ser: 1.06 mg/dL — ABNORMAL HIGH (ref 0.44–1.00)
GFR calc Af Amer: >60 mL/min
GFR calc non Af Amer: >60 mL/min
Glucose, Bld: 461 mg/dL — ABNORMAL HIGH (ref 65–99)
Potassium: 4.7 mmol/L (ref 3.5–5.1)
Sodium: 132 mmol/L — ABNORMAL LOW (ref 135–145)

## 2016-03-29 LAB — URINALYSIS, ROUTINE W REFLEX MICROSCOPIC
Bilirubin Urine: NEGATIVE
Glucose, UA: >1000 mg/dL — AB
Hgb urine dipstick: NEGATIVE
Ketones, ur: NEGATIVE mg/dL
Leukocytes, UA: NEGATIVE
Nitrite: NEGATIVE
Protein, ur: NEGATIVE mg/dL
Specific Gravity, Urine: 1.03 (ref 1.005–1.030)
pH: 6 (ref 5.0–8.0)

## 2016-03-29 LAB — CBC
HEMATOCRIT: 45.1 % (ref 36.0–46.0)
HEMOGLOBIN: 14.4 g/dL (ref 12.0–15.0)
MCH: 25.7 pg — ABNORMAL LOW (ref 26.0–34.0)
MCHC: 31.9 g/dL (ref 30.0–36.0)
MCV: 80.4 fL (ref 78.0–100.0)
Platelets: 236 10*3/uL (ref 150–400)
RBC: 5.61 MIL/uL — ABNORMAL HIGH (ref 3.87–5.11)
RDW: 14.4 % (ref 11.5–15.5)
WBC: 5.6 10*3/uL (ref 4.0–10.5)

## 2016-03-29 LAB — CBG MONITORING, ED
GLUCOSE-CAPILLARY: 386 mg/dL — AB (ref 65–99)
GLUCOSE-CAPILLARY: 371 mg/dL — AB (ref 65–99)
Glucose-Capillary: 295 mg/dL — ABNORMAL HIGH (ref 65–99)

## 2016-03-29 LAB — POCT URINALYSIS DIP (DEVICE)
BILIRUBIN URINE: NEGATIVE
Glucose, UA: 500 mg/dL — AB
KETONES UR: NEGATIVE mg/dL
LEUKOCYTES UA: NEGATIVE
Nitrite: NEGATIVE
Protein, ur: NEGATIVE mg/dL
SPECIFIC GRAVITY, URINE: 1.01 (ref 1.005–1.030)
Urobilinogen, UA: 2 mg/dL — ABNORMAL HIGH (ref 0.0–1.0)
pH: 6.5 (ref 5.0–8.0)

## 2016-03-29 LAB — I-STAT TROPONIN, ED
Troponin i, poc: 0 ng/mL (ref 0.00–0.08)
Troponin i, poc: 0.01 ng/mL (ref 0.00–0.08)

## 2016-03-29 MED ORDER — INSULIN ASPART 100 UNIT/ML ~~LOC~~ SOLN
10.0000 [IU] | Freq: Once | SUBCUTANEOUS | Status: AC
  Administered 2016-03-29: 10 [IU] via INTRAVENOUS
  Filled 2016-03-29: qty 1

## 2016-03-29 MED ORDER — IBUPROFEN 400 MG PO TABS
400.0000 mg | ORAL_TABLET | Freq: Once | ORAL | Status: AC
  Administered 2016-03-29: 400 mg via ORAL
  Filled 2016-03-29: qty 1

## 2016-03-29 MED ORDER — SODIUM CHLORIDE 0.9 % IV BOLUS (SEPSIS)
1000.0000 mL | Freq: Once | INTRAVENOUS | Status: AC
  Administered 2016-03-29: 1000 mL via INTRAVENOUS

## 2016-03-29 NOTE — ED Provider Notes (Signed)
CSN: [id number]     Arrival date & time 03/29/16  1436 History   First MD Initiated Contact with Patient 03/29/16 1502     No chief complaint on file.  (Consider location/radiation/quality/duration/timing/severity/associated sxs/prior Treatment) Patient states she is weak and went to her doctor office and had FSBS 520 and was administered 10 units of rapid acting insulin and she states her fsbs has come down to 510.  She is feeling weak.  She has impaired vision and cannot read or fill out her ED paperwork and has to have her daughter help her.  She is c/o some chest tightness and states she has this a lot and when they check it out it is negative.  She feels SOB.    Chest Pain  Pain location:  Substernal area Pain quality: pressure   Pain radiates to:  Does not radiate Pain severity:  Mild Onset quality:  Sudden Duration:  30 minutes Timing:  Constant Progression:  Unchanged Chronicity:  New Context: breathing   Worsened by:  Nothing Ineffective treatments:  None tried Risk factors: diabetes mellitus     Past Medical History:  Diagnosis Date  . Anxiety   . Arthritis    osteoarthritis   . Asthma    has been eval. by Dr. Gwenette Greet- last 06/2014  . Bipolar 1 disorder ([place])   . Complication of anesthesia    09/13/14: malignant HTN with inducction, case cx but no sourse identified; 11/05/14 remifentanyl infusion used to attenuate hypergynamic induction  . Depression   . Diabetes mellitus   . GERD (gastroesophageal reflux disease)   . Headache    sometimes has migraines   . Hyperlipidemia   . Hypertension   . Knee pain, chronic   . OSA on CPAP   . Pain in joint, ankle and foot 02/17/2013  . Pneumonia    hosp. for a couple of day- not sure when it was   . PTSD (post-traumatic stress disorder)   . Shortness of breath dyspnea   . Sleep apnea    uses CPAP intermittently, pt. doesn't remember where she had the study   Past Surgical History:  Procedure Laterality Date  . ANTERIOR  CERVICAL DECOMP/DISCECTOMY FUSION N/A 11/05/2014   Procedure: ANTERIOR CERVICAL DECOMPRESSION/DISCECTOMY FUSION 1 LEVEL;  Surgeon: Consuella Lose, MD;  Location: [place] [facility];  Service: Neurosurgery;  Laterality: N/A;  Cervical five- cervical seven anterior cervical decompression with fusion interbody prosthesis plating and bonegraft  . CARPAL TUNNEL RELEASE Left 2001   Left wrist  . CERVICAL LAMINECTOMY  03/31/20176   CERVICAL 4 SEVEN LAMINECTOMIES WITH LATERAL FUSION  . Cotton Osteotomy w/ Bone Graft Right 09/30/2014   Rt foot @ [facility]  . GASTROC RECESSION EXTREMITY Right 3//20/14   Right foot  . HERNIA REPAIR     1996  . Lapidus fusion Right 01/10/12   Rt foot  . MIDTARSAL ARTHRODESIS Right 07/17/12   Rt foot  . POSTERIOR CERVICAL FUSION/FORAMINOTOMY N/A 09/16/2015   Procedure: Cervical four - seven laminectomies with lateral mass fusion;  Surgeon: Consuella Lose, MD;  Location: [place] [facility];  Service: Neurosurgery;  Laterality: N/A;  C4-7 laminectomy with lateral mass fusion  . Remove implant deep Right 07/17/12   Rt foot  . Remove Implant Deep Right 07/22/2014   Rt foot @ [facility]  . TUBAL LIGATION     Family History  Problem Relation Age of Onset  . Kidney disease Mother   . Heart attack Father    Social History  Substance Use Topics  . Smoking status: Former Smoker    Packs/day: 2.00    Years: 6.00    Types: Cigarettes    Quit date: 06/18/1978  . Smokeless tobacco: Never Used  . Alcohol use 0.0 oz/week     Comment: weekend- use of beer    OB History    No data available     Review of Systems  Constitutional: Negative.   HENT: Negative.   Eyes: Negative.   Respiratory: Negative.   Cardiovascular: Positive for chest pain.  Gastrointestinal: Negative.   Endocrine: Negative.   Genitourinary: Negative.   Musculoskeletal: Negative.   Skin: Positive for wound.  Allergic/Immunologic: Negative.   Neurological: Negative.   Hematological: Negative.   Psychiatric/Behavioral:  Negative.     Allergies  Celery oil; Pork-derived products; Latex; and Tape  Home Medications   Prior to Admission medications   Medication Sig Start Date End Date Taking? Authorizing Provider  acetaminophen (TYLENOL) 325 MG tablet Take 2 tablets (650 mg total) by mouth every 4 (four) hours as needed (temp > 100.5). 09/22/15   [name], MD  albuterol (PROVENTIL HFA;VENTOLIN HFA) 108 (90 Base) MCG/ACT inhaler Inhale 1-2 puffs into the lungs every 6 (six) hours as needed for wheezing or shortness of breath. 11/22/15   Rigoberto Noel, MD  amLODipine (NORVASC) 10 MG tablet Take 10 mg by mouth daily.  01/20/16   Historical Provider, MD  atenolol (TENORMIN) 50 MG tablet Take 1 tablet (50 mg total) by mouth daily. 09/10/14   Eugenie Filler, MD  budesonide-formoterol [facility]) 160-4.5 MCG/ACT inhaler Inhale 2 puffs into the lungs 2 (two) times daily. 11/22/15   Rigoberto Noel, MD  cyclobenzaprine (FLEXERIL) 10 MG tablet Take 1 tablet (10 mg total) by mouth 2 (two) times daily as needed for muscle spasms. 01/16/16   Montine Circle, PA-C  diazepam (VALIUM) 5 MG tablet Take 1 tablet (5 mg total) by mouth every 6 (six) hours as needed for muscle spasms. 12/01/15   Forde Dandy, MD  DULoxetine (CYMBALTA) 60 MG capsule Take 60 mg by mouth at bedtime.     Historical Provider, MD  fluticasone (FLONASE) 50 MCG/ACT nasal spray Place 1 spray into both nostrils 2 (two) times daily. Patient taking differently: Place 1 spray into both nostrils daily as needed for allergies.  04/07/15   Nona Dell, PA-C  gabapentin (NEURONTIN) 600 MG tablet Take 1 tablet (600 mg total) by mouth daily. Patient taking differently: Take 600 mg by mouth 2 (two) times daily.  09/23/15   [name], MD  HYDROcodone-acetaminophen (NORCO) 5-325 MG tablet Take 1 tablet by mouth every 6 (six) hours as needed for severe pain. 02/02/16   Mercedes Camprubi-Soms, PA-C  HYDROcodone-acetaminophen (NORCO/VICODIN) 5-325 MG tablet Take 1  tablet by mouth every 6 (six) hours as needed. 11/12/15   Varney Biles, MD  HYDROcodone-acetaminophen (NORCO/VICODIN) 5-325 MG tablet Take 2 tablets by mouth every 4 (four) hours as needed. 12/01/15   Forde Dandy, MD  ibuprofen (ADVIL,MOTRIN) 800 MG tablet Take 1 tablet (800 mg total) by mouth every 8 (eight) hours as needed. 03/09/16   Myeong O Sheard, DPM  insulin aspart protamine - aspart (NOVOLOG MIX 70/30 FLEXPEN) (70-30) 100 UNIT/ML FlexPen Inject 0.3 mLs (30 Units total) into the skin 2 (two) times daily with a meal. 02/05/16   Leo Grosser, MD  lactulose (CHRONULAC) 10 GM/15ML solution Take 45 mLs (30 g total) by mouth 2 (two) times daily. 09/22/15   Gokul  [place] Pink, MD  linagliptin (TRADJENTA) 5 MG TABS tablet Take 1 tablet (5 mg total) by mouth daily with breakfast. 09/22/15   [name], MD  LYRICA 75 MG capsule Take 1 capsule by mouth 2 (two) times daily. 01/26/16   Historical Provider, MD  meloxicam (MOBIC) 7.5 MG tablet Take 1 tablet by mouth every morning. 12/26/15   Historical Provider, MD  nabumetone (RELAFEN) 500 MG tablet Take 1 tablet (500 mg total) by mouth 2 (two) times daily. 07/25/15   Myeong O Sheard, DPM  naproxen (NAPROSYN) 375 MG tablet Take 1 tablet (375 mg total) by mouth 2 (two) times daily. 12/28/15   Tiffany Carlota Raspberry, PA-C  naproxen (NAPROSYN) 375 MG tablet Take 1 tablet (375 mg total) by mouth 2 (two) times daily as needed for mild pain, moderate pain or headache (TAKE WITH MEALS.). 02/02/16   Mercedes Camprubi-Soms, PA-C  naproxen (NAPROSYN) 500 MG tablet Take 1 tablet (500 mg total) by mouth 2 (two) times daily as needed for moderate pain. 12/01/15   Forde Dandy, MD  omeprazole (PRILOSEC) 20 MG capsule Take 20 mg by mouth daily.     Historical Provider, MD  ondansetron (ZOFRAN ODT) 8 MG disintegrating tablet Take 1 tablet (8 mg total) by mouth every 8 (eight) hours as needed for nausea or vomiting. 01/28/14   Sherwood Gambler, MD  oxyCODONE-acetaminophen (PERCOCET) 10-325 MG  tablet Take 1 tablet by mouth every 6 (six) hours as needed for pain. 03/13/16   Myeong O Sheard, DPM  polyethylene glycol (MIRALAX / GLYCOLAX) packet Take 17 g by mouth daily as needed for mild constipation. 09/22/15   [name], MD  tiZANidine (ZANAFLEX) 4 MG tablet Take 1 tablet (4 mg total) by mouth every 8 (eight) hours as needed for muscle spasms. 09/10/14   Eugenie Filler, MD  trimethoprim-polymyxin b [facility]) ophthalmic solution Place 1 drop into the left eye every 4 (four) hours. 02/19/14   Donne Hazel, MD   Meds Ordered and Administered this Visit  Medications - No data to display  There were no vitals taken for this visit. No data found.   Physical Exam  Constitutional: She appears well-developed and well-nourished.  HENT:  Head: Normocephalic and atraumatic.  Eyes: Conjunctivae and EOM are normal. Pupils are equal, round, and reactive to light.  Neck: Normal range of motion. Neck supple.  Cardiovascular: Normal rate, regular rhythm and normal heart sounds.   Pulmonary/Chest: Effort normal and breath sounds normal.  Nursing note and vitals reviewed.   Urgent Care Course   Clinical Course    Procedures (including critical care time)  Labs Review Labs Reviewed  POCT URINALYSIS DIP (DEVICE) - Abnormal; Notable for the following:       Result Value   Glucose, UA 500 (*)    Hgb urine dipstick TRACE (*)    Urobilinogen, UA 2.0 (*)    All other components within normal limits  POCT I-STAT, CHEM 8 - Abnormal; Notable for the following:    Sodium 134 (*)    Chloride 95 (*)    Glucose, Bld 491 (*)    Calcium, Ion 1.13 (*)    Hemoglobin 17.0 (*)    HCT 50.0 (*)    All other components within normal limits    Imaging Review No results found.   Visual Acuity Review  Right Eye Distance:   Left Eye Distance:   Bilateral Distance:    Right Eye Near:   Left Eye Near:    Bilateral Near:  MDM   Hyperglycemia - FSBS 491 urine w/o ketones.   Recommend follow up higher level of care in ED.  Chest Pain - EKG NSR w/o acute ST-T changes.  Recommend transfer to ED for further evaluation  Diabetes - Advised paient need to work on compliace with diabetic regimen.  Follow up with PCP after ED evaluation for chest pain.   Lysbeth Penner, FNP 03/29/16 [place], [place] 03/29/16 [phone]

## 2016-03-29 NOTE — Discharge Instructions (Signed)
Follow up with your primary care provider for re-evaluation. Take your insulin as prescribed. Return to the ED if you experience severe worsening of your symptoms, increased pain, difficulty breathing, loss of consciousness.

## 2016-03-29 NOTE — ED Notes (Signed)
Samantha PA at bedside 

## 2016-03-29 NOTE — ED Triage Notes (Signed)
Pt states for the last two days has been having high blood sugars with blurry vision and frequent urination. Pt was seen a UC and BS was 490 per pt. 386 CBG in triage

## 2016-03-29 NOTE — ED Notes (Signed)
Pt repoerts that ashwe has been sick with cough and runny nose. No cough noted while at [facility]

## 2016-03-29 NOTE — ED Notes (Signed)
Pt also states now she is having right and left sided chest pains for the last 2 days.

## 2016-03-29 NOTE — ED Provider Notes (Signed)
[facility] DEPT Provider Note   CSN: [id number] Arrival date & time: 03/29/16  1614     History   Chief Complaint Chief Complaint  Patient presents with  . Hyperglycemia    HPI Diana Henderson is a 45 y.o. female with a pmhx of DM, obesity, HTN, HLD who presents to the ED today c/o chest pain and hyperglycemia. Pt states that she has been feeling fatigued for the last few days and today developed some blurry vision and became concerned that her sugars were high. Pt went to UC and was told that her sugar was over 400 so they sent her to the ED for further evaluation. Pt also states that she began experiencing left sided chest pain that she describes a a tightness onset about 5-6 hours ago. Pt states that she often gets intermittent CP and this feels similar to that. Pain does not radiate. Pain is worsened with palpation, deep breathing and leaning forward. She denies and SOB, dizziness diaphoresis. No recent travel, surgery, hx of Ca or OCPs.   HPI  Past Medical History:  Diagnosis Date  . Anxiety   . Arthritis    osteoarthritis   . Asthma    has been eval. by Dr. Gwenette Greet- last 06/2014  . Bipolar 1 disorder ([place])   . Complication of anesthesia    09/13/14: malignant HTN with inducction, case cx but no sourse identified; 11/05/14 remifentanyl infusion used to attenuate hypergynamic induction  . Depression   . Diabetes mellitus   . GERD (gastroesophageal reflux disease)   . Headache    sometimes has migraines   . Hyperlipidemia   . Hypertension   . Knee pain, chronic   . OSA on CPAP   . Pain in joint, ankle and foot 02/17/2013  . Pneumonia    hosp. for a couple of day- not sure when it was   . PTSD (post-traumatic stress disorder)   . Shortness of breath dyspnea   . Sleep apnea    uses CPAP intermittently, pt. doesn't remember where she had the study    Patient Active Problem List   Diagnosis Date Noted  . Diabetes mellitus, insulin dependent (IDDM), uncontrolled  ([place]) 09/18/2015  . AKI (acute kidney injury) ([place]) 09/18/2015  . Morbid obesity ([place]) 09/18/2015  . Asthma 09/18/2015  . Left ventricular diastolic dysfunction, NYHA class 1 09/18/2015  . Uncontrolled type 2 diabetes mellitus with complication ([place])   . Chronic diastolic CHF (congestive heart failure), NYHA class 1 ([place])   . PTSD (post-traumatic stress disorder)   . Bipolar I disorder ([place])   . Anxiety state   . Knee pain, chronic   . Cervical spondylosis with myelopathy 11/05/2014  . HTN (hypertension), malignant 09/08/2014  . HNP (herniated nucleus pulposus) with myelopathy, cervical 06/29/2014  . Deformity of metatarsal bone of right foot 06/28/2014  . CTS (carpal tunnel syndrome) 05/04/2014  . OSA (obstructive sleep apnea) 05/04/2014  . Hereditary and idiopathic peripheral neuropathy 05/04/2014  . Ganglion cyst 03/10/2014  . Vision loss, left eye 02/18/2014  . Conjunctivitis 02/17/2014  . Asthma exacerbation 01/13/2014  . Abdominal pain, unspecified site 10/28/2013  . Injury of foot, right, superficial 10/23/2013  . Tenosynovitis of foot 08/17/2013  . Pain in joint, ankle and foot 02/17/2013  . Edema of foot 02/17/2013  . Status post foot surgery 09/09/2012  . Hyperlipidemia   . 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 ANTERIOR  CERVICAL DECOMP/DISCECTOMY FUSION N/A 11/05/2014   Procedure: ANTERIOR CERVICAL DECOMPRESSION/DISCECTOMY FUSION 1 LEVEL;  Surgeon: Consuella Lose, MD;  Location: [facility];  Service: Neurosurgery;  Laterality: N/A;  Cervical five- cervical seven anterior cervical decompression with fusion interbody prosthesis plating and bonegraft  . CARPAL TUNNEL RELEASE Left 2001   Left wrist  . CERVICAL LAMINECTOMY  03/31/20176   CERVICAL 4 SEVEN LAMINECTOMIES WITH LATERAL FUSION  . Cotton Osteotomy w/ Bone Graft Right 09/30/2014   Rt foot @ [facility]  . GASTROC  RECESSION EXTREMITY Right 3//20/14   Right foot  . HERNIA REPAIR     1996  . Lapidus fusion Right 01/10/12   Rt foot  . MIDTARSAL ARTHRODESIS Right 07/17/12   Rt foot  . POSTERIOR CERVICAL FUSION/FORAMINOTOMY N/A 09/16/2015   Procedure: Cervical four - seven laminectomies with lateral mass fusion;  Surgeon: Consuella Lose, MD;  Location: [facility];  Service: Neurosurgery;  Laterality: N/A;  C4-7 laminectomy with lateral mass fusion  . Remove implant deep Right 07/17/12   Rt foot  . Remove Implant Deep Right 07/22/2014   Rt foot @ [facility]  . TUBAL LIGATION      OB History    No data available       Home Medications    Prior to Admission medications   Medication Sig Start Date End Date Taking? Authorizing Provider  acetaminophen (TYLENOL) 325 MG tablet Take 2 tablets (650 mg total) by mouth every 4 (four) hours as needed (temp > 100.5). 09/22/15   [name], MD  albuterol (PROVENTIL HFA;VENTOLIN HFA) 108 (90 Base) MCG/ACT inhaler Inhale 1-2 puffs into the lungs every 6 (six) hours as needed for wheezing or shortness of breath. 11/22/15   Rigoberto Noel, MD  amLODipine (NORVASC) 10 MG tablet Take 10 mg by mouth daily.  01/20/16   Historical Provider, MD  atenolol (TENORMIN) 50 MG tablet Take 1 tablet (50 mg total) by mouth daily. 09/10/14   Eugenie Filler, MD  budesonide-formoterol [facility]) 160-4.5 MCG/ACT inhaler Inhale 2 puffs into the lungs 2 (two) times daily. 11/22/15   Rigoberto Noel, MD  cyclobenzaprine (FLEXERIL) 10 MG tablet Take 1 tablet (10 mg total) by mouth 2 (two) times daily as needed for muscle spasms. 01/16/16   Montine Circle, PA-C  diazepam (VALIUM) 5 MG tablet Take 1 tablet (5 mg total) by mouth every 6 (six) hours as needed for muscle spasms. 12/01/15   Forde Dandy, MD  DULoxetine (CYMBALTA) 60 MG capsule Take 60 mg by mouth at bedtime.     Historical Provider, MD  fluticasone (FLONASE) 50 MCG/ACT nasal spray Place 1 spray into both nostrils 2 (two) times  daily. Patient taking differently: Place 1 spray into both nostrils daily as needed for allergies.  04/07/15   Nona Dell, PA-C  gabapentin (NEURONTIN) 600 MG tablet Take 1 tablet (600 mg total) by mouth daily. Patient taking differently: Take 600 mg by mouth 2 (two) times daily.  09/23/15   [name], MD  HYDROcodone-acetaminophen (NORCO) 5-325 MG tablet Take 1 tablet by mouth every 6 (six) hours as needed for severe pain. 02/02/16   Mercedes Camprubi-Soms, PA-C  HYDROcodone-acetaminophen (NORCO/VICODIN) 5-325 MG tablet Take 1 tablet by mouth every 6 (six) hours as needed. 11/12/15   Varney Biles, MD  HYDROcodone-acetaminophen (NORCO/VICODIN) 5-325 MG tablet Take 2 tablets by mouth every 4 (four) hours as needed. 12/01/15   Forde Dandy, MD  ibuprofen (ADVIL,MOTRIN) 800 MG tablet Take 1 tablet (800  mg total) by mouth every 8 (eight) hours as needed. 03/09/16   Myeong O Sheard, DPM  insulin aspart protamine - aspart (NOVOLOG MIX 70/30 FLEXPEN) (70-30) 100 UNIT/ML FlexPen Inject 0.3 mLs (30 Units total) into the skin 2 (two) times daily with a meal. 02/05/16   Leo Grosser, MD  lactulose (CHRONULAC) 10 GM/15ML solution Take 45 mLs (30 g total) by mouth 2 (two) times daily. 09/22/15   [name], MD  linagliptin (TRADJENTA) 5 MG TABS tablet Take 1 tablet (5 mg total) by mouth daily with breakfast. 09/22/15   [name], MD  LYRICA 75 MG capsule Take 1 capsule by mouth 2 (two) times daily. 01/26/16   Historical Provider, MD  meloxicam (MOBIC) 7.5 MG tablet Take 1 tablet by mouth every morning. 12/26/15   Historical Provider, MD  nabumetone (RELAFEN) 500 MG tablet Take 1 tablet (500 mg total) by mouth 2 (two) times daily. 07/25/15   Myeong O Sheard, DPM  naproxen (NAPROSYN) 375 MG tablet Take 1 tablet (375 mg total) by mouth 2 (two) times daily. 12/28/15   Tiffany Carlota Raspberry, PA-C  naproxen (NAPROSYN) 375 MG tablet Take 1 tablet (375 mg total) by mouth 2 (two) times daily as needed for mild pain,  moderate pain or headache (TAKE WITH MEALS.). 02/02/16   Mercedes Camprubi-Soms, PA-C  naproxen (NAPROSYN) 500 MG tablet Take 1 tablet (500 mg total) by mouth 2 (two) times daily as needed for moderate pain. 12/01/15   Forde Dandy, MD  omeprazole (PRILOSEC) 20 MG capsule Take 20 mg by mouth daily.     Historical Provider, MD  ondansetron (ZOFRAN ODT) 8 MG disintegrating tablet Take 1 tablet (8 mg total) by mouth every 8 (eight) hours as needed for nausea or vomiting. 01/28/14   Sherwood Gambler, MD  oxyCODONE-acetaminophen (PERCOCET) 10-325 MG tablet Take 1 tablet by mouth every 6 (six) hours as needed for pain. 03/13/16   Myeong O Sheard, DPM  polyethylene glycol (MIRALAX / GLYCOLAX) packet Take 17 g by mouth daily as needed for mild constipation. 09/22/15   [name], MD  tiZANidine (ZANAFLEX) 4 MG tablet Take 1 tablet (4 mg total) by mouth every 8 (eight) hours as needed for muscle spasms. 09/10/14   Eugenie Filler, MD  trimethoprim-polymyxin b Mayra Neer) ophthalmic solution Place 1 drop into the left eye every 4 (four) hours. 02/19/14   Donne Hazel, MD    Family History Family History  Problem Relation Age of Onset  . Kidney disease Mother   . Heart attack Father     Social History Social History  Substance Use Topics  . Smoking status: Former Smoker    Packs/day: 2.00    Years: 6.00    Types: Cigarettes    Quit date: 06/18/1978  . Smokeless tobacco: Never Used  . Alcohol use 0.0 oz/week     Comment: weekend- use of beer      Allergies   Celery oil; Pork-derived products; Latex; and Tape   Review of Systems Review of Systems  All other systems reviewed and are negative.    Physical Exam Updated Vital Signs BP 139/87   Pulse 83   Temp 97.8 F (36.6 C) (Oral)   Resp 16   Ht 5\' 6"  (1.676 m)   Wt 131.1 kg   LMP 03/24/2016   SpO2 98%   BMI 46.65 kg/m   Physical Exam  Constitutional: She is oriented to person, place, and time. She appears well-developed and  well-nourished. No distress.  Obese   HENT:  Head: Normocephalic and atraumatic.  Mouth/Throat: No oropharyngeal exudate.  Eyes: Conjunctivae and EOM are normal. Pupils are equal, round, and reactive to light. Right eye exhibits no discharge. Left eye exhibits no discharge. No scleral icterus.  Cardiovascular: Normal rate, regular rhythm, normal heart sounds and intact distal pulses.  Exam reveals no gallop and no friction rub.   No murmur heard. Pulmonary/Chest: Effort normal and breath sounds normal. No respiratory distress. She has no wheezes. She has no rales. She exhibits tenderness ( anterior chest wall).  Abdominal: Soft. She exhibits no distension. There is no tenderness. There is no guarding.  Musculoskeletal: Normal range of motion. She exhibits no edema.  Neurological: She is alert and oriented to person, place, and time.  Skin: Skin is warm and dry. No rash noted. She is not diaphoretic. No erythema. No pallor.  Psychiatric: She has a normal mood and affect. Her behavior is normal.  Nursing note and vitals reviewed.    ED Treatments / Results  Labs (all labs ordered are listed, but only abnormal results are displayed) Labs Reviewed  BASIC METABOLIC PANEL - Abnormal; Notable for the following:       Result Value   Sodium 132 (*)    Chloride 97 (*)    Glucose, Bld 461 (*)    BUN 5 (*)    Creatinine, Ser 1.06 (*)    All other components within normal limits  CBC - Abnormal; Notable for the following:    RBC 5.61 (*)    MCH 25.7 (*)    All other components within normal limits  CBG MONITORING, ED - Abnormal; Notable for the following:    Glucose-Capillary 386 (*)    All other components within normal limits  CBG MONITORING, ED - Abnormal; Notable for the following:    Glucose-Capillary 371 (*)    All other components within normal limits  URINALYSIS, ROUTINE W REFLEX MICROSCOPIC (NOT AT [facility])  I-STAT TROPOININ, ED    EKG  EKG  Interpretation  Date/Time:  Thursday March 29 2016 16:27:08 EDT Ventricular Rate:  94 PR Interval:  138 QRS Duration: 74 QT Interval:  368 QTC Calculation: 460 R Axis:   66 Text Interpretation:  Normal sinus rhythm Normal ECG since last tracing no significant change Confirmed by Sabra Heck  MD, BRIAN ([id number]) on 03/29/2016 7:06:18 PM       Radiology Dg Chest 2 View  Result Date: 03/29/2016 CLINICAL DATA:  45 year old female with left chest pain shortness of breath and nonproductive cough for 2 days. Initial encounter. Smoker. EXAM: CHEST  2 VIEW COMPARISON:  02/01/2016 and earlier. FINDINGS: Mildly lower lung volumes on the frontal view, but larger on the lateral. Stable mild cardiomegaly. Other mediastinal contours are within normal limits. Visualized tracheal air column is within normal limits. No pneumothorax, pulmonary edema, pleural effusion or confluent pulmonary opacity. No acute osseous abnormality identified. Postoperative changes in the cervical spine. Widespread thoracic endplate spurring. IMPRESSION: No acute cardiopulmonary abnormality. Electronically Signed   By: Genevie Ann M.D.   On: 03/29/2016 19:59    Procedures Procedures (including critical care time)  Medications Ordered in ED Medications  sodium chloride 0.9 % bolus 1,000 mL (1,000 mLs Intravenous New Bag/Given 03/29/16 1920)  insulin aspart (novoLOG) injection 10 Units (10 Units Intravenous Given 03/29/16 1920)     Initial Impression / Assessment and Plan / ED Course  I have reviewed the triage vital signs and the nursing notes.  Pertinent labs & imaging  results that were available during my care of the patient were reviewed by me and considered in my medical decision making (see chart for details).  Clinical Course    45 year old female with history of obesity, DM, HTN, HLD presents the ED today with multiple complaints including hyperglycemia and chest pain. She's been having ongoing right-sided chest pain for  the last 6-7 hours. Chest pain is reproducible with palpation. No diaphoresis, vomiting, shortness of breath. Vitals are stable. No hypoxia or tachycardia. Pain relieved with ibuprofen. Patient is low risk heart score. She is PE RC negative, doubt PE. Likely musculoskeletal in nature. Patient also here with elevated blood glucose, 461. She has not taken her insulin today. No sign of DKA. No ketonuria. No anion gap. She was administered 10 units of insulin and 2 L of fluid. Blood sugar now to 95. The list she is safe for discharge. PCP follow-up. She was instructed to monitor her blood sugars at home and continue home insulin as prescribed.  Case discussed with Dr. Sabra Heck who agrees with treatment plan.  Final Clinical Impressions(s) / ED Diagnoses   Final diagnoses:  None    New Prescriptions New Prescriptions   No medications on file     Carlos Levering, PA-C 03/30/16 0000    Noemi Chapel, MD 03/31/16 [phone]

## 2016-03-29 NOTE — ED Notes (Signed)
Pt given discharge instructions and verbalized understanding of instructions. Pts daughter present at [facility] to drive patient home. Pt pain improving.  Patient ambulated to front entrance

## 2016-03-30 ENCOUNTER — Other Ambulatory Visit: Payer: Self-pay | Admitting: Pulmonary Disease

## 2016-04-11 ENCOUNTER — Encounter: Payer: Self-pay | Admitting: Podiatry

## 2016-04-11 ENCOUNTER — Ambulatory Visit (INDEPENDENT_AMBULATORY_CARE_PROVIDER_SITE_OTHER): Payer: Medicare Other | Admitting: Podiatry

## 2016-04-11 DIAGNOSIS — M659 Synovitis and tenosynovitis, unspecified: Secondary | ICD-10-CM

## 2016-04-11 DIAGNOSIS — M25571 Pain in right ankle and joints of right foot: Secondary | ICD-10-CM | POA: Diagnosis not present

## 2016-04-11 MED ORDER — OXYCODONE-ACETAMINOPHEN 10-325 MG PO TABS: 1.0000 | ORAL_TABLET | Freq: Four times a day (QID) | ORAL | 0 refills | Status: DC | PRN

## 2016-04-11 NOTE — Progress Notes (Signed)
Subjective:  45 year old female presents requesting pain medication. Patient ambulates assisted with cane. Still having numbness over the surgical site.  At night mid top of right foot hurts with shooting pain.  Tere is no new changes in condition.   Recent History: She was in ER for the left foot pain on 12/27/15. They examined and gave her Naproxen.  Herleft foot has been hurting more than the right.  Prior to this left foot pain she was seeing at this office monthly for her right foot pain and was scheduled to have right foot surgery, explore and stabilize mid foot if needed. The surgery was cancelled for now due to left foot pain. Left hip area is hurting with Sciatic nerve problem for the past 3 weeks. She had to take extra pain medication. She was diagnosed with Lumbar Radiculopathy.   She has had neck surgery on 09/16/15 and was discharged on 09/23/15.   Objective: No changes in findings since last visit. Dermatologic: All nails are thick and hypertrophic.  Vascular status are within normal. Neurologic: Pain in dorsum and ankle with ambulation bilateral.  Unable to place full weight on left foot and getting around in tip toes and very painful.  Orthopedic: Positive of elevated first ray with forefoot varus right. Tight Achilles tendon bilateral.   Assessment: No change in assessment.  1. Repeated right mid foot surgery, Lapidus fusion, Cotton osteotomy, repeat fusion, removal of hardware.  2. Chronic foot and ankle pain bilateral. 3. Difficulty walking and frequent fall.  4. Osteoarthropathy foot and ankle bilateral. 5. Ankle Equinus with faulty biomechanics bilateral.  6. Diabetic under control. 7. Weakness left arm. 8. Chronic knee pain. 9. Lumbar Radiculopathy.  Plan: Reviewed clinical findings and available treatment options. Previously discussed the previous surgical intervention failed to improve her right foot pain. May do the same on the left  foot.  Prescription re ordered as per request. Return as needed.

## 2016-04-11 NOTE — Patient Instructions (Signed)
Seen for pain in right foot. Pain medication prescribed. Return as needed.

## 2016-04-13 DIAGNOSIS — L739 Follicular disorder, unspecified: Secondary | ICD-10-CM | POA: Diagnosis not present

## 2016-04-14 ENCOUNTER — Encounter (HOSPITAL_COMMUNITY): Payer: Self-pay | Admitting: Emergency Medicine

## 2016-04-14 ENCOUNTER — Emergency Department (HOSPITAL_COMMUNITY)
Admission: EM | Admit: 2016-04-14 | Discharge: 2016-04-14 | Disposition: A | Discharge status: Home / Self Care | Payer: Medicare Other | Attending: Emergency Medicine | Admitting: Emergency Medicine

## 2016-04-14 DIAGNOSIS — I10 Essential (primary) hypertension: Secondary | ICD-10-CM | POA: Insufficient documentation

## 2016-04-14 DIAGNOSIS — L739 Follicular disorder, unspecified: Secondary | ICD-10-CM | POA: Diagnosis not present

## 2016-04-14 DIAGNOSIS — Z87891 Personal history of nicotine dependence: Secondary | ICD-10-CM | POA: Insufficient documentation

## 2016-04-14 DIAGNOSIS — E119 Type 2 diabetes mellitus without complications: Secondary | ICD-10-CM | POA: Insufficient documentation

## 2016-04-14 DIAGNOSIS — Z79899 Other long term (current) drug therapy: Secondary | ICD-10-CM | POA: Insufficient documentation

## 2016-04-14 DIAGNOSIS — Y939 Activity, unspecified: Secondary | ICD-10-CM | POA: Diagnosis not present

## 2016-04-14 DIAGNOSIS — J45909 Unspecified asthma, uncomplicated: Secondary | ICD-10-CM | POA: Diagnosis not present

## 2016-04-14 DIAGNOSIS — Y999 Unspecified external cause status: Secondary | ICD-10-CM | POA: Insufficient documentation

## 2016-04-14 DIAGNOSIS — Y929 Unspecified place or not applicable: Secondary | ICD-10-CM | POA: Insufficient documentation

## 2016-04-14 DIAGNOSIS — S0591XA Unspecified injury of right eye and orbit, initial encounter: Secondary | ICD-10-CM | POA: Diagnosis present

## 2016-04-14 DIAGNOSIS — H5711 Ocular pain, right eye: Secondary | ICD-10-CM | POA: Diagnosis not present

## 2016-04-14 DIAGNOSIS — W228XXA Striking against or struck by other objects, initial encounter: Secondary | ICD-10-CM | POA: Insufficient documentation

## 2016-04-14 DIAGNOSIS — Z794 Long term (current) use of insulin: Secondary | ICD-10-CM | POA: Insufficient documentation

## 2016-04-14 MED ORDER — POLYMYXIN B-TRIMETHOPRIM 10000-0.1 UNIT/ML-% OP SOLN
1.0000 [drp] | OPHTHALMIC | Status: DC
  Administered 2016-04-14: 1 [drp] via OPHTHALMIC
  Filled 2016-04-14: qty 10

## 2016-04-14 MED ORDER — TETRACAINE HCL 0.5 % OP SOLN
1.0000 [drp] | Freq: Once | OPHTHALMIC | Status: AC
  Administered 2016-04-14: 1 [drp] via OPHTHALMIC
  Filled 2016-04-14: qty 4

## 2016-04-14 MED ORDER — FLUORESCEIN SODIUM 1 MG OP STRP
1.0000 | ORAL_STRIP | Freq: Once | OPHTHALMIC | Status: AC
  Administered 2016-04-14: 1 via OPHTHALMIC
  Filled 2016-04-14: qty 1

## 2016-04-14 NOTE — Discharge Instructions (Signed)
Please see your eye doctor on Monday.   Please instill 1 drop every 4 hours that your are awake.  You may take Tylenol or ibuprofen for pain.  You may also try cool compresses.

## 2016-04-14 NOTE — ED Triage Notes (Signed)
Pt reports getting hit by Frisbee on Saturday in the right eye.  Pt went to urgent care and was sent here.  Pt reports that at urgent care they gave her 'eye numbing drops' but that they didn't work.  Reports that her eye is watery with some blurry vision.  Pt has hx DM.  Denies hx of eye issues or surgery.  Pt does not wear contacts or glasses.
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
## 2016-04-16 DIAGNOSIS — H209 Unspecified iridocyclitis: Secondary | ICD-10-CM | POA: Diagnosis not present

## 2016-04-16 DIAGNOSIS — H5711 Ocular pain, right eye: Secondary | ICD-10-CM | POA: Diagnosis not present

## 2016-04-18 DIAGNOSIS — H209 Unspecified iridocyclitis: Secondary | ICD-10-CM | POA: Diagnosis not present

## 2016-04-18 DIAGNOSIS — H5711 Ocular pain, right eye: Secondary | ICD-10-CM | POA: Diagnosis not present

## 2016-04-19 ENCOUNTER — Other Ambulatory Visit: Payer: Self-pay | Admitting: Podiatry

## 2016-04-19 ENCOUNTER — Other Ambulatory Visit: Payer: Self-pay | Admitting: Pulmonary Disease

## 2016-04-23 DIAGNOSIS — F332 Major depressive disorder, recurrent severe without psychotic features: Secondary | ICD-10-CM | POA: Diagnosis not present

## 2016-04-26 ENCOUNTER — Ambulatory Visit (INDEPENDENT_AMBULATORY_CARE_PROVIDER_SITE_OTHER): Payer: Medicare Other | Admitting: Podiatry

## 2016-04-26 DIAGNOSIS — M25579 Pain in unspecified ankle and joints of unspecified foot: Secondary | ICD-10-CM | POA: Diagnosis not present

## 2016-04-26 DIAGNOSIS — M659 Synovitis and tenosynovitis, unspecified: Secondary | ICD-10-CM | POA: Diagnosis not present

## 2016-04-26 DIAGNOSIS — S99921A Unspecified injury of right foot, initial encounter: Secondary | ICD-10-CM

## 2016-04-26 MED ORDER — OXYCODONE-ACETAMINOPHEN 10-325 MG PO TABS: 1.0000 | ORAL_TABLET | Freq: Three times a day (TID) | ORAL | 0 refills | Status: DC | PRN
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
## 2016-04-27 ENCOUNTER — Encounter: Payer: Self-pay | Admitting: Podiatry

## 2016-04-28 DIAGNOSIS — R079 Chest pain, unspecified: Secondary | ICD-10-CM | POA: Diagnosis not present

## 2016-04-28 DIAGNOSIS — J40 Bronchitis, not specified as acute or chronic: Secondary | ICD-10-CM | POA: Diagnosis not present

## 2016-04-28 DIAGNOSIS — R739 Hyperglycemia, unspecified: Secondary | ICD-10-CM | POA: Diagnosis not present

## 2016-04-28 DIAGNOSIS — J209 Acute bronchitis, unspecified: Secondary | ICD-10-CM | POA: Diagnosis not present

## 2016-04-28 DIAGNOSIS — R0789 Other chest pain: Secondary | ICD-10-CM | POA: Diagnosis not present

## 2016-05-07 ENCOUNTER — Ambulatory Visit (INDEPENDENT_AMBULATORY_CARE_PROVIDER_SITE_OTHER): Payer: Medicare Other | Admitting: Podiatry

## 2016-05-07 ENCOUNTER — Encounter: Payer: Self-pay | Admitting: Podiatry

## 2016-05-07 DIAGNOSIS — S4992XA Unspecified injury of left shoulder and upper arm, initial encounter: Secondary | ICD-10-CM | POA: Diagnosis not present

## 2016-05-07 DIAGNOSIS — I10 Essential (primary) hypertension: Secondary | ICD-10-CM | POA: Diagnosis not present

## 2016-05-07 DIAGNOSIS — S99921A Unspecified injury of right foot, initial encounter: Secondary | ICD-10-CM

## 2016-05-07 DIAGNOSIS — E669 Obesity, unspecified: Secondary | ICD-10-CM | POA: Diagnosis not present

## 2016-05-07 DIAGNOSIS — M19079 Primary osteoarthritis, unspecified ankle and foot: Secondary | ICD-10-CM | POA: Diagnosis not present

## 2016-05-07 DIAGNOSIS — I119 Hypertensive heart disease without heart failure: Secondary | ICD-10-CM | POA: Diagnosis not present

## 2016-05-07 DIAGNOSIS — E119 Type 2 diabetes mellitus without complications: Secondary | ICD-10-CM | POA: Diagnosis not present

## 2016-05-07 DIAGNOSIS — J45909 Unspecified asthma, uncomplicated: Secondary | ICD-10-CM | POA: Diagnosis not present

## 2016-05-07 DIAGNOSIS — M7989 Other specified soft tissue disorders: Secondary | ICD-10-CM | POA: Diagnosis not present

## 2016-05-07 DIAGNOSIS — G4733 Obstructive sleep apnea (adult) (pediatric): Secondary | ICD-10-CM | POA: Diagnosis not present

## 2016-05-07 DIAGNOSIS — M25571 Pain in right ankle and joints of right foot: Secondary | ICD-10-CM | POA: Diagnosis not present

## 2016-05-07 DIAGNOSIS — M25579 Pain in unspecified ankle and joints of unspecified foot: Secondary | ICD-10-CM

## 2016-05-07 DIAGNOSIS — G629 Polyneuropathy, unspecified: Secondary | ICD-10-CM | POA: Diagnosis not present

## 2016-05-07 DIAGNOSIS — M25512 Pain in left shoulder: Secondary | ICD-10-CM | POA: Diagnosis not present

## 2016-05-07 DIAGNOSIS — M79671 Pain in right foot: Secondary | ICD-10-CM | POA: Diagnosis not present

## 2016-05-07 MED ORDER — OXYCODONE-ACETAMINOPHEN 10-325 MG PO TABS: 1.0000 | ORAL_TABLET | Freq: Three times a day (TID) | ORAL | 0 refills | Status: DC | PRN

## 2016-05-07 NOTE — Progress Notes (Signed)
Patient stated that he had a fall last night 04/27/16 and hurt her shoulder and foot. She was checked out by Urgent care center and informed of pain is from bruise. Pain in right foot with ambulation. No edema or erythema on right foot. Pain with range of motion right foot. Unable to lift her left shoulder due to pain. Radiographic examination reveal no acute changes at the painful site right. Informed to stay home and rest.  Ok to take pain medication as needed.

## 2016-05-07 NOTE — Patient Instructions (Addendum)
Seen for pain from a fall that took place last night 05/06/16. X-ray look normal without any abnormal changes. As per request pain medication Percocet 10/325. May take Motrin 800 mg that was prescribed previously. Return as needed.

## 2016-05-13 DIAGNOSIS — Z23 Encounter for immunization: Secondary | ICD-10-CM | POA: Diagnosis not present

## 2016-05-17 ENCOUNTER — Ambulatory Visit (INDEPENDENT_AMBULATORY_CARE_PROVIDER_SITE_OTHER): Payer: Medicare Other | Admitting: Podiatry

## 2016-05-17 ENCOUNTER — Encounter: Payer: Self-pay | Admitting: Podiatry

## 2016-05-17 DIAGNOSIS — S90921D Unspecified superficial injury of right foot, subsequent encounter: Secondary | ICD-10-CM | POA: Diagnosis not present

## 2016-05-17 DIAGNOSIS — M25579 Pain in unspecified ankle and joints of unspecified foot: Secondary | ICD-10-CM | POA: Diagnosis not present

## 2016-05-17 MED ORDER — OXYCODONE-ACETAMINOPHEN 10-325 MG PO TABS: 1.0000 | ORAL_TABLET | Freq: Three times a day (TID) | ORAL | 0 refills | Status: DC | PRN

## 2016-05-17 NOTE — Patient Instructions (Signed)
Rx percocet 10/325 as per request. Return as needed.

## 2016-05-17 NOTE — Progress Notes (Signed)
Patient walked in requesting prescription for pain medication. She had a fall on 04/27/16 and her body is sore. Her right foot also hurt to ambulate and pain is radiating to leg and hip.   Objective: No change since last visit, 05/07/16. No edema or erythema on right foot. Pain with weight bearing on right foot.  Assessment: Hyperalgia right foot. No change in osseous structures. No edema or erythema at painful site. History of recent fall 05/07/16.  Plan: Continue to rest.  Pain medication prescribed as per request.

## 2016-05-24 ENCOUNTER — Ambulatory Visit (INDEPENDENT_AMBULATORY_CARE_PROVIDER_SITE_OTHER): Payer: Medicare Other | Admitting: Adult Health

## 2016-05-24 ENCOUNTER — Encounter: Payer: Self-pay | Admitting: Adult Health

## 2016-05-24 DIAGNOSIS — J4531 Mild persistent asthma with (acute) exacerbation: Secondary | ICD-10-CM | POA: Diagnosis not present

## 2016-05-24 MED ORDER — AZITHROMYCIN 250 MG PO TABS: ORAL_TABLET | ORAL | 0 refills | Status: AC

## 2016-05-24 MED ORDER — BUDESONIDE-FORMOTEROL FUMARATE 160-4.5 MCG/ACT IN AERO: 2.0000 | INHALATION_SPRAY | Freq: Two times a day (BID) | RESPIRATORY_TRACT | 0 refills | Status: AC

## 2016-05-24 NOTE — Progress Notes (Signed)
Subjective:    Patient ID: Diana Henderson, female    DOB: 11/10/1970, 45 y.o.   MRN: [id number]  HPI  45 yo female followed for OSA, Asthma, and Chronic cough .   05/24/2016 Acute OV : Cough  Pt presents for an acute office . Complains of 1-2 weeks of cough, congestioin, nasal drainage and stuffiness . Has run out of her symbicort. She has not used any otc meds . Has thick mucus but hard to cough up . Denies chest pain, orthopnea, edema or fever.     Past Medical History:  Diagnosis Date  . Anxiety   . Arthritis    osteoarthritis   . Asthma    has been eval. by Dr. Gwenette Greet- last 06/2014  . Bipolar 1 disorder ([place])   . Complication of anesthesia    09/13/14: malignant HTN with inducction, case cx but no sourse identified; 11/05/14 remifentanyl infusion used to attenuate hypergynamic induction  . Depression   . Diabetes mellitus   . GERD (gastroesophageal reflux disease)   . Headache    sometimes has migraines   . Hyperlipidemia   . Hypertension   . Knee pain, chronic   . OSA on CPAP   . Pain in joint, ankle and foot 02/17/2013  . Pneumonia    hosp. for a couple of day- not sure when it was   . PTSD (post-traumatic stress disorder)   . Shortness of breath dyspnea   . Sleep apnea    uses CPAP intermittently, pt. doesn't remember where she had the study   Current Outpatient Prescriptions on File Prior to Visit  Medication Sig Dispense Refill  . amLODipine (NORVASC) 10 MG tablet Take 10 mg by mouth daily.     Marland Kitchen atenolol (TENORMIN) 50 MG tablet Take 1 tablet (50 mg total) by mouth daily. 30 tablet 0  . cyclobenzaprine (FLEXERIL) 10 MG tablet Take 1 tablet (10 mg total) by mouth 2 (two) times daily as needed for muscle spasms. 20 tablet 0  . diazepam (VALIUM) 5 MG tablet Take 1 tablet (5 mg total) by mouth every 6 (six) hours as needed for muscle spasms. 10 tablet 0  . DULoxetine (CYMBALTA) 60 MG capsule Take 60 mg by mouth daily.     . fluticasone (FLONASE) 50 MCG/ACT nasal  spray Place 1 spray into both nostrils 2 (two) times daily. (Patient taking differently: Place 1 spray into both nostrils daily as needed for allergies. ) 9.9 g 0  . gabapentin (NEURONTIN) 600 MG tablet Take 1 tablet (600 mg total) by mouth daily. (Patient taking differently: Take 600 mg by mouth 2 (two) times daily. )    . HYDROcodone-acetaminophen (NORCO) 5-325 MG tablet Take 1 tablet by mouth every 6 (six) hours as needed for severe pain. 8 tablet 0  . ibuprofen (ADVIL,MOTRIN) 800 MG tablet TAKE 1 TABLET(800 MG) BY MOUTH EVERY 8 HOURS AS NEEDED 60 tablet 0  . insulin aspart protamine - aspart (NOVOLOG MIX 70/30 FLEXPEN) (70-30) 100 UNIT/ML FlexPen Inject 0.3 mLs (30 Units total) into the skin 2 (two) times daily with a meal. 15 mL 0  . lactulose (CHRONULAC) 10 GM/15ML solution Take 45 mLs (30 g total) by mouth 2 (two) times daily. 2000 mL 0  . linagliptin (TRADJENTA) 5 MG TABS tablet Take 1 tablet (5 mg total) by mouth daily with breakfast. 30 tablet   . meloxicam (MOBIC) 7.5 MG tablet Take 1 tablet by mouth every morning.    . nabumetone (RELAFEN) 500 MG  tablet Take 1 tablet (500 mg total) by mouth 2 (two) times daily. 60 tablet 2  . naproxen (NAPROSYN) 500 MG tablet Take 1 tablet (500 mg total) by mouth 2 (two) times daily as needed for moderate pain. 30 tablet 0  . oxyCODONE-acetaminophen (PERCOCET) 10-325 MG tablet Take 1 tablet by mouth every 8 (eight) hours as needed for pain. 60 tablet 0  . SYMBICORT 160-4.5 MCG/ACT inhaler INHALE 2 PUFFS INTO THE LUNGS 2 (TWO) TIMES DAILY. 1 Inhaler 5  . tiZANidine (ZANAFLEX) 4 MG tablet Take 1 tablet (4 mg total) by mouth every 8 (eight) hours as needed for muscle spasms. 15 tablet 0  . trimethoprim-polymyxin b (POLYTRIM) ophthalmic solution Place 1 drop into the left eye every 4 (four) hours. 10 mL 0  . VENTOLIN HFA 108 (90 Base) MCG/ACT inhaler INHALE 1 TO 2 PUFFS INTO THE LUNGS EVERY 6 (SIX) HOURS AS NEEDED FOR WHEEZING OR SHORTNESS OF BREATH. 18 each 2    . omeprazole (PRILOSEC) 20 MG capsule Take 20 mg by mouth daily.     . ondansetron (ZOFRAN ODT) 8 MG disintegrating tablet Take 1 tablet (8 mg total) by mouth every 8 (eight) hours as needed for nausea or vomiting. (Patient not taking: Reported on 05/24/2016) 15 tablet 0  . polyethylene glycol (MIRALAX / GLYCOLAX) packet Take 17 g by mouth daily as needed for mild constipation. (Patient not taking: Reported on 05/24/2016) 14 each 0   No current facility-administered medications on file prior to visit.      Review of Systems Constitutional:   No  weight loss, night sweats,  Fevers, chills, fatigue, or  lassitude.  HEENT:   No headaches,  Difficulty swallowing,  Tooth/dental problems, or  Sore throat,                No sneezing, itching, ear ache,  +nasal congestion, post nasal drip,   CV:  No chest pain,  Orthopnea, PND, swelling in lower extremities, anasarca, dizziness, palpitations, syncope.   GI  No heartburn, indigestion, abdominal pain, nausea, vomiting, diarrhea, change in bowel habits, loss of appetite, bloody stools.   Resp:    No chest wall deformity  Skin: no rash or lesions.  GU: no dysuria, change in color of urine, no urgency or frequency.  No flank pain, no hematuria   MS:  No joint pain or swelling.  No decreased range of motion.  No back pain.  Psych:  No change in mood or affect. No depression or anxiety.  No memory loss.         Objective:   Physical Exam Vitals:   05/24/16 1702  BP: 126/78  Pulse: (!) 101  SpO2: 97%  Weight: 287 lb (130.2 kg)  Height: 5\' 6"  (1.676 m)   GEN: A/Ox3; pleasant , NAD, well nourished    HEENT:  [place]/AT,  EACs-clear, TMs-wnl, NOSE-clear drainage  THROAT-clear, no lesions, no postnasal drip or exudate noted.   NECK:  Supple w/ fair ROM; no JVD; normal carotid impulses w/o bruits; no thyromegaly or nodules palpated; no lymphadenopathy.    RESP  Clear  P & A; w/o, wheezes/ rales/ or rhonchi. no accessory muscle use, no  dullness to percussion  CARD:  RRR, no m/r/g  , no peripheral edema, pulses intact, no cyanosis or clubbing.  GI:   Soft & nt; nml bowel sounds; no organomegaly or masses detected.   Musco: Warm bil, no deformities or joint swelling noted.   Neuro: alert, no focal deficits  noted.    Skin: Warm, no lesions or rashes    Jersi Mcmaster NP-C   Pulmonary and Critical Care  05/24/16

## 2016-05-24 NOTE — Patient Instructions (Signed)
ZPack take as directed  Mucinex DM Twice daily  As needed  Cough/congestion  Restart  Symbicort 2 puffs Twice daily   follow up Dr. Elsworth Soho  In 4 months and As needed   Please contact office for sooner follow up if symptoms do not improve or worsen or seek emergency care

## 2016-05-25 DIAGNOSIS — F332 Major depressive disorder, recurrent severe without psychotic features: Secondary | ICD-10-CM | POA: Diagnosis not present

## 2016-05-25 NOTE — Assessment & Plan Note (Signed)
Mild flare with URI and off symbicort   Plan  Patient Instructions  ZPack take as directed  Mucinex DM Twice daily  As needed  Cough/congestion  Restart  Symbicort 2 puffs Twice daily   follow up Dr. Elsworth Soho  In 4 months and As needed   Please contact office for sooner follow up if symptoms do not improve or worsen or seek emergency care

## 2016-05-26 DIAGNOSIS — R0600 Dyspnea, unspecified: Secondary | ICD-10-CM | POA: Diagnosis not present

## 2016-05-26 DIAGNOSIS — G8929 Other chronic pain: Secondary | ICD-10-CM | POA: Diagnosis not present

## 2016-05-26 DIAGNOSIS — R05 Cough: Secondary | ICD-10-CM | POA: Diagnosis not present

## 2016-05-26 DIAGNOSIS — M25512 Pain in left shoulder: Secondary | ICD-10-CM | POA: Diagnosis not present

## 2016-05-27 DIAGNOSIS — R0600 Dyspnea, unspecified: Secondary | ICD-10-CM | POA: Diagnosis not present

## 2016-06-04 DIAGNOSIS — M4712 Other spondylosis with myelopathy, cervical region: Secondary | ICD-10-CM | POA: Diagnosis not present

## 2016-06-07 ENCOUNTER — Encounter: Payer: Self-pay | Admitting: Podiatry

## 2016-06-07 ENCOUNTER — Ambulatory Visit (INDEPENDENT_AMBULATORY_CARE_PROVIDER_SITE_OTHER): Payer: Medicare Other | Admitting: Podiatry

## 2016-06-07 DIAGNOSIS — S90921D Unspecified superficial injury of right foot, subsequent encounter: Secondary | ICD-10-CM | POA: Diagnosis not present

## 2016-06-07 DIAGNOSIS — F332 Major depressive disorder, recurrent severe without psychotic features: Secondary | ICD-10-CM | POA: Diagnosis not present

## 2016-06-07 DIAGNOSIS — M659 Synovitis and tenosynovitis, unspecified: Secondary | ICD-10-CM | POA: Diagnosis not present

## 2016-06-07 DIAGNOSIS — R262 Difficulty in walking, not elsewhere classified: Secondary | ICD-10-CM | POA: Diagnosis not present

## 2016-06-07 DIAGNOSIS — M65979 Unspecified synovitis and tenosynovitis, unspecified ankle and foot: Secondary | ICD-10-CM

## 2016-06-07 MED ORDER — OXYCODONE-ACETAMINOPHEN 10-325 MG PO TABS: 1.0000 | ORAL_TABLET | Freq: Three times a day (TID) | ORAL | 0 refills | Status: DC | PRN

## 2016-06-07 NOTE — Progress Notes (Signed)
Subjective: 45 year old female presents complaining of pain in instep and top of right foot.  Able to walk barely on the foot since the fall. Her godson also stepped on her foot yesterday.  Hx of a fall on 04/27/16. Her left shoulder and knee is painful. She will be followed by her PCP in January.  She is not able to lift her left arm. Her right foot hurts to ambulate and pain is radiating to leg and hip.   Objective: Continuing foot pain since the fall occurred in, 05/07/16. No edema or erythema noted on right foot. Pain with weight bearing and ambulation on right foot. Her x-ray of right foot revealed to have no acute changes.   Assessment: Hyperalgia right foot. Tenosynovitis right foot. History of recent fall 05/07/16.  Plan: Placed in CAM walker.  Continue to rest.  Pain medication prescribed as per request.

## 2016-06-07 NOTE — Patient Instructions (Signed)
Pain in right foot since injury.  Discussed possible use of Wheelchair but might be a problem with stairs in house entrance. Placed in CAM walker boot. Pain medication prescribed. Return in one month.

## 2016-06-16 DIAGNOSIS — R1310 Dysphagia, unspecified: Secondary | ICD-10-CM | POA: Diagnosis not present

## 2016-06-17 ENCOUNTER — Emergency Department (HOSPITAL_COMMUNITY)
Admission: EM | Admit: 2016-06-17 | Discharge: 2016-06-17 | Disposition: A | Discharge status: Home / Self Care | Payer: Medicare Other | Attending: Emergency Medicine | Admitting: Emergency Medicine

## 2016-06-17 ENCOUNTER — Encounter (HOSPITAL_COMMUNITY): Payer: Self-pay | Admitting: *Deleted

## 2016-06-17 DIAGNOSIS — R1310 Dysphagia, unspecified: Secondary | ICD-10-CM | POA: Insufficient documentation

## 2016-06-17 DIAGNOSIS — I110 Hypertensive heart disease with heart failure: Secondary | ICD-10-CM | POA: Insufficient documentation

## 2016-06-17 DIAGNOSIS — J029 Acute pharyngitis, unspecified: Secondary | ICD-10-CM | POA: Insufficient documentation

## 2016-06-17 DIAGNOSIS — B379 Candidiasis, unspecified: Secondary | ICD-10-CM | POA: Diagnosis not present

## 2016-06-17 DIAGNOSIS — Z87891 Personal history of nicotine dependence: Secondary | ICD-10-CM | POA: Insufficient documentation

## 2016-06-17 DIAGNOSIS — I5032 Chronic diastolic (congestive) heart failure: Secondary | ICD-10-CM | POA: Insufficient documentation

## 2016-06-17 DIAGNOSIS — J45909 Unspecified asthma, uncomplicated: Secondary | ICD-10-CM | POA: Insufficient documentation

## 2016-06-17 DIAGNOSIS — Z79899 Other long term (current) drug therapy: Secondary | ICD-10-CM | POA: Diagnosis not present

## 2016-06-17 DIAGNOSIS — B3789 Other sites of candidiasis: Secondary | ICD-10-CM

## 2016-06-17 DIAGNOSIS — Z794 Long term (current) use of insulin: Secondary | ICD-10-CM | POA: Diagnosis not present

## 2016-06-17 DIAGNOSIS — Z91040 Latex allergy status: Secondary | ICD-10-CM | POA: Insufficient documentation

## 2016-06-17 DIAGNOSIS — E119 Type 2 diabetes mellitus without complications: Secondary | ICD-10-CM | POA: Insufficient documentation

## 2016-06-17 LAB — CBG MONITORING, ED: Glucose-Capillary: 291 mg/dL — ABNORMAL HIGH (ref 65–99)

## 2016-06-17 LAB — RAPID STREP SCREEN (MED CTR MEBANE ONLY): Streptococcus, Group A Screen (Direct): NEGATIVE

## 2016-06-17 MED ORDER — ACETAMINOPHEN 160 MG/5ML PO SOLN
650.0000 mg | Freq: Once | ORAL | Status: AC
  Administered 2016-06-17: 650 mg via ORAL
  Filled 2016-06-17: qty 20.3

## 2016-06-17 MED ORDER — HYDROCODONE-HOMATROPINE 5-1.5 MG/5ML PO SYRP
10.0000 mL | ORAL_SOLUTION | Freq: Once | ORAL | Status: AC
  Administered 2016-06-17: 10 mL via ORAL
  Filled 2016-06-17: qty 10

## 2016-06-17 MED ORDER — FLUCONAZOLE 200 MG PO TABS
400.0000 mg | ORAL_TABLET | Freq: Once | ORAL | Status: AC
  Administered 2016-06-17: 400 mg via ORAL
  Filled 2016-06-17: qty 2

## 2016-06-17 MED ORDER — HYDROCODONE-HOMATROPINE 5-1.5 MG/5ML PO SYRP: ORAL_SOLUTION | ORAL | 0 refills | Status: DC

## 2016-06-17 MED ORDER — ACETAMINOPHEN 160 MG/5ML PO ELIX: 625.0000 mg | ORAL_SOLUTION | ORAL | 0 refills | Status: DC | PRN

## 2016-06-17 MED ORDER — FLUCONAZOLE 200 MG PO TABS: 200.0000 mg | ORAL_TABLET | Freq: Every day | ORAL | 0 refills | Status: AC

## 2016-06-17 NOTE — ED Provider Notes (Signed)
[facility] DEPT Provider Note   CSN: [id number] Arrival date & time: 06/17/16  1644     History   Chief Complaint Chief Complaint  Patient presents with  . Dysphagia    HPI Diana Henderson is a 45 y.o. female.  HPI Patient has been having pain with swallowing for one week. She reports the pain is quite severe. She reports that it feels like something sticks right in her throat but her food is going down. She reports is painful when it goes by that spot. She denies she ever choked on anything or had food get stuck. This was so severe she was seen at the emergency department in [place]. She reports she had a CT scan of the neck done and they told her there was nothing identified. Old that she may need to get a endoscopy but she can't wait until her next scheduled appointment. She reports she is spitting out her saliva. She has not had fever or chills. She reports she has had cough and that really makes it hurt in her throat. She denies that she has shortness of breath. Patient poor she does have wheezing and she is an asthmatic. She reports shortness of breath has not been worsening. Patient reports she is able to eat but it just always feels like the food sticks at that spot. No vomiting. She does have a history of reflux but has been only sporadically taking her omeprazole. Patient is also diabetic. Past Medical History:  Diagnosis Date  . Anxiety   . Arthritis    osteoarthritis   . Asthma    has been eval. by Dr. Gwenette Greet- last 06/2014  . Bipolar 1 disorder ([place])   . Complication of anesthesia    09/13/14: malignant HTN with inducction, case cx but no sourse identified; 11/05/14 remifentanyl infusion used to attenuate hypergynamic induction  . Depression   . Diabetes mellitus   . GERD (gastroesophageal reflux disease)   . Headache    sometimes has migraines   . Hyperlipidemia   . Hypertension   . Knee pain, chronic   . OSA on CPAP   . Pain in joint, ankle and foot 02/17/2013   . Pneumonia    hosp. for a couple of day- not sure when it was   . PTSD (post-traumatic stress disorder)   . Shortness of breath dyspnea   . Sleep apnea    uses CPAP intermittently, pt. doesn't remember where she had the study    Patient Active Problem List   Diagnosis Date Noted  . Diabetes mellitus, insulin dependent (IDDM), uncontrolled ([place]) 09/18/2015  . AKI (acute kidney injury) ([place]) 09/18/2015  . Morbid obesity ([place]) 09/18/2015  . Asthma 09/18/2015  . Left ventricular diastolic dysfunction, NYHA class 1 09/18/2015  . Uncontrolled type 2 diabetes mellitus with complication ([place])   . Chronic diastolic CHF (congestive heart failure), NYHA class 1 ([place])   . PTSD (post-traumatic stress disorder)   . Bipolar I disorder ([place])   . Anxiety state   . Knee pain, chronic   . Cervical spondylosis with myelopathy 11/05/2014  . HTN (hypertension), malignant 09/08/2014  . HNP (herniated nucleus pulposus) with myelopathy, cervical 06/29/2014  . Deformity of metatarsal bone of right foot 06/28/2014  . CTS (carpal tunnel syndrome) 05/04/2014  . OSA (obstructive sleep apnea) 05/04/2014  . Hereditary and idiopathic peripheral neuropathy 05/04/2014  . Ganglion cyst 03/10/2014  . Vision loss, left eye 02/18/2014  . Conjunctivitis 02/17/2014  . Asthma exacerbation 01/13/2014  .  Abdominal pain, unspecified site 10/28/2013  . Injury of foot, right, superficial 10/23/2013  . Tenosynovitis of foot 08/17/2013  . Pain in joint, ankle and foot 02/17/2013  . Edema of foot 02/17/2013  . Status post foot surgery 09/09/2012  . Hyperlipidemia   . 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 ANTERIOR CERVICAL DECOMP/DISCECTOMY FUSION N/A 11/05/2014   Procedure: ANTERIOR CERVICAL DECOMPRESSION/DISCECTOMY FUSION 1 LEVEL;  Surgeon: Consuella Lose, MD;  Location: [facility];  Service: Neurosurgery;   Laterality: N/A;  Cervical five- cervical seven anterior cervical decompression with fusion interbody prosthesis plating and bonegraft  . CARPAL TUNNEL RELEASE Left 2001   Left wrist  . CERVICAL LAMINECTOMY  03/31/20176   CERVICAL 4 SEVEN LAMINECTOMIES WITH LATERAL FUSION  . Cotton Osteotomy w/ Bone Graft Right 09/30/2014   Rt foot @ [facility]  . GASTROC RECESSION EXTREMITY Right 3//20/14   Right foot  . HERNIA REPAIR     1996  . [place]us fusion Right 01/10/12   Rt foot  . MIDTARSAL ARTHRODESIS Right 07/17/12   Rt foot  . POSTERIOR CERVICAL FUSION/FORAMINOTOMY N/A 09/16/2015   Procedure: Cervical four - seven laminectomies with lateral mass fusion;  Surgeon: Consuella Lose, MD;  Location: [facility];  Service: Neurosurgery;  Laterality: N/A;  C4-7 laminectomy with lateral mass fusion  . Remove implant deep Right 07/17/12   Rt foot  . Remove Implant Deep Right 07/22/2014   Rt foot @ [facility]  . TUBAL LIGATION      OB History    No data available       Home Medications    Prior to Admission medications   Medication Sig Start Date End Date Taking? Authorizing Provider  amLODipine (NORVASC) 10 MG tablet Take 10 mg by mouth daily.  01/20/16  Yes Historical Provider, MD  atenolol (TENORMIN) 50 MG tablet Take 1 tablet (50 mg total) by mouth daily. 09/10/14  Yes Eugenie Filler, MD  budesonide-formoterol [facility]) 160-4.5 MCG/ACT inhaler Inhale 2 puffs into the lungs 2 (two) times daily. 05/24/16  Yes Tammy S Parrett, NP  cyclobenzaprine (FLEXERIL) 10 MG tablet Take 1 tablet (10 mg total) by mouth 2 (two) times daily as needed for muscle spasms. 01/16/16  Yes Montine Circle, PA-C  diazepam (VALIUM) 5 MG tablet Take 1 tablet (5 mg total) by mouth every 6 (six) hours as needed for muscle spasms. 12/01/15  Yes Forde Dandy, MD  DULoxetine (CYMBALTA) 60 MG capsule Take 60 mg by mouth daily.    Yes Historical Provider, MD  fluticasone (FLONASE) 50 MCG/ACT nasal spray Place 1 spray into both nostrils 2  (two) times daily. Patient taking differently: Place 1 spray into both nostrils daily as needed for allergies.  04/07/15  Yes Nona Dell, PA-C  gabapentin (NEURONTIN) 600 MG tablet Take 1 tablet (600 mg total) by mouth daily. Patient taking differently: Take 600 mg by mouth 2 (two) times daily.  09/23/15  Yes [name], MD  ibuprofen (ADVIL,MOTRIN) 800 MG tablet TAKE 1 TABLET(800 MG) BY MOUTH EVERY 8 HOURS AS NEEDED 04/23/16  Yes Myeong O Sheard, DPM  insulin aspart protamine - aspart (NOVOLOG MIX 70/30 FLEXPEN) (70-30) 100 UNIT/ML FlexPen Inject 0.3 mLs (30 Units total) into the skin 2 (two) times daily with a meal. 02/05/16  Yes Leo Grosser, MD  linagliptin (TRADJENTA) 5 MG TABS tablet Take 1 tablet (5 mg total) by mouth daily with breakfast. 09/22/15  Yes [name], MD  meloxicam (MOBIC) 7.5 MG tablet Take 1 tablet by mouth every morning. 12/26/15  Yes Historical Provider, MD  omeprazole (PRILOSEC) 20 MG capsule Take 20 mg by mouth daily.    Yes Historical Provider, MD  oxyCODONE-acetaminophen (PERCOCET) 10-325 MG tablet Take 1 tablet by mouth every 8 (eight) hours as needed for pain. 06/07/16  Yes Myeong O Sheard, DPM  SYMBICORT 160-4.5 MCG/ACT inhaler INHALE 2 PUFFS INTO THE LUNGS 2 (TWO) TIMES DAILY. 04/11/16  Yes Rigoberto Noel, MD  traZODone (DESYREL) 50 MG tablet Take 50 mg by mouth 3 (three) times daily. 06/14/16  Yes Historical Provider, MD  VENTOLIN HFA 108 (90 Base) MCG/ACT inhaler INHALE 1 TO 2 PUFFS INTO THE LUNGS EVERY 6 (SIX) HOURS AS NEEDED FOR WHEEZING OR SHORTNESS OF BREATH. 04/11/16  Yes Rigoberto Noel, MD  acetaminophen (TYLENOL) 160 MG/5ML elixir Take 19.5 mLs (625 mg total) by mouth every 4 (four) hours as needed for pain. 06/17/16   Charlesetta Shanks, MD  fluconazole (DIFLUCAN) 200 MG tablet Take 1 tablet (200 mg total) by mouth daily. 06/17/16 07/01/16  Charlesetta Shanks, MD  HYDROcodone-homatropine [facility]) 5-1.5 MG/5ML syrup 5-10 mL every 6 hours as needed for pain  and cough 06/17/16   Charlesetta Shanks, MD  lactulose (CHRONULAC) 10 GM/15ML solution Take 45 mLs (30 g total) by mouth 2 (two) times daily. Patient not taking: Reported on 06/17/2016 09/22/15   [name], MD  nabumetone (RELAFEN) 500 MG tablet Take 1 tablet (500 mg total) by mouth 2 (two) times daily. Patient not taking: Reported on 06/17/2016 07/25/15   Myeong O Sheard, DPM  naproxen (NAPROSYN) 500 MG tablet Take 1 tablet (500 mg total) by mouth 2 (two) times daily as needed for moderate pain. Patient not taking: Reported on 06/17/2016 12/01/15   Forde Dandy, MD  polyethylene glycol [facility]aveling) packet Take 17 g by mouth daily as needed for mild constipation. Patient not taking: Reported on 06/17/2016 09/22/15   [name], MD  tiZANidine (ZANAFLEX) 4 MG tablet Take 1 tablet (4 mg total) by mouth every 8 (eight) hours as needed for muscle spasms. Patient not taking: Reported on 06/17/2016 09/10/14   Eugenie Filler, MD  trimethoprim-polymyxin b [facility]) ophthalmic solution Place 1 drop into the left eye every 4 (four) hours. Patient not taking: Reported on 06/17/2016 02/19/14   Donne Hazel, MD    Family History Family History  Problem Relation Age of Onset  . Kidney disease Mother   . Heart attack Father     Social History Social History  Substance Use Topics  . Smoking status: Former Smoker    Packs/day: 2.00    Years: 6.00    Types: Cigarettes    Quit date: 06/18/1978  . Smokeless tobacco: Never Used  . Alcohol use 0.0 oz/week     Comment: weekend- use of beer      Allergies   Celery oil; Pork-derived products; Latex; and Tape   Review of Systems Review of Systems 10 Systems reviewed and are negative for acute change except as noted in the HPI.   Physical Exam Updated Vital Signs BP 109/69 (BP Location: Right Arm)   Pulse 89   Temp 98.3 F (36.8 C) (Oral)   Resp 14   SpO2 97%   Physical Exam  Constitutional: She is oriented to person, place, and  time.  Patient is alert and nontoxic. She does appear to be uncomfortable and is spitting white saliva into  an emesis bag. No respiratory distress.  HENT:  Head: Normocephalic and atraumatic.  Right Ear: External ear normal.  Left Ear: External ear normal.  Nose: Nose normal.  Posterior oropharynx and soft palate has white patches which are on erythematous and inflamed-appearing tissue. These also are covering the tonsillar pillars and the uvula. Neck is supple without meningismus or lymphadenopathy.  Eyes: EOM are normal. Pupils are equal, round, and reactive to light.  Neck: Normal range of motion. Neck supple.  Cardiovascular: Normal rate, regular rhythm and normal heart sounds.   Pulmonary/Chest: Effort normal.  Diffuse expiratory wheeze. Good air flow no respiratory distress.  Abdominal: Soft. Bowel sounds are normal. She exhibits no distension. There is no tenderness.  Musculoskeletal: Normal range of motion.  Neurological: She is alert and oriented to person, place, and time. She exhibits normal muscle tone. Coordination normal.  Skin: Skin is warm and dry.  Psychiatric: She has a normal mood and affect.     ED Treatments / Results  Labs (all labs ordered are listed, but only abnormal results are displayed) Labs Reviewed  CBG MONITORING, ED - Abnormal; Notable for the following:       Result Value   Glucose-Capillary 291 (*)    All other components within normal limits  RAPID STREP SCREEN (NOT AT [facility])  CULTURE, GROUP A STREP [facility])    EKG  EKG Interpretation None       Radiology No results found.  Procedures Procedures (including critical care time)  Medications Ordered in ED Medications  HYDROcodone-homatropine (HYCODAN) 5-1.5 MG/5ML syrup 10 mL (10 mLs Oral Given 06/17/16 1949)  acetaminophen (TYLENOL) solution 650 mg (650 mg Oral Given 06/17/16 1949)  fluconazole (DIFLUCAN) tablet 400 mg (400 mg Oral Given 06/17/16 2114)     Initial Impression /  Assessment and Plan / ED Course  I have reviewed the triage vital signs and the nursing notes.  Pertinent labs & imaging results that were available during my care of the patient were reviewed by me and considered in my medical decision making (see chart for details).  Clinical Course     Final Clinical Impressions(s) / ED Diagnoses   Final diagnoses:  Odynophagia  Yeast pharyngitis  Patient reports odynophagia but foods or passing. There was no evidence of food impaction. On physical examination patient has very distinct white patches on erythematous inflamed base consistent with candidiasis. I suspect she has esophageal candidiasis in association with this with the pain she is describing. Patient is diabetic. She did get pain improvement with Hycodan and acetaminophen. Patient reports her food does pass. She will be continued on Diflucan for 2 weeks area patient is advised she must have follow up with her primary care provider to see if this dysphagia feeling is resolving and determine if endoscopy or further direct visualization will be indicated.  New Prescriptions New Prescriptions   ACETAMINOPHEN (TYLENOL) 160 MG/5ML ELIXIR    Take 19.5 mLs (625 mg total) by mouth every 4 (four) hours as needed for pain.   FLUCONAZOLE (DIFLUCAN) 200 MG TABLET    Take 1 tablet (200 mg total) by mouth daily.   HYDROCODONE-HOMATROPINE (HYCODAN) 5-1.5 MG/5ML SYRUP    5-10 mL every 6 hours as needed for pain and cough     Charlesetta Shanks, MD 06/17/16 2118

## 2016-06-17 NOTE — ED Notes (Signed)
ED Provider at bedside. 

## 2016-06-17 NOTE — Discharge Instructions (Signed)
Take your omeprazole daily as well as the prescribed fluconazole. You may use acetaminophen and Hycodan syrup as needed for pain.

## 2016-06-17 NOTE — ED Triage Notes (Signed)
Pt complains of difficulty swallowing for the past week. Pt saw her PCP, who referred her to a GI specialist but pt states she cannot wait that long to see him. Pt states she had CT of neck performed but did not find anything.   Pt states she feels it feels like food in her throat. Pt has metal plate in her neck that she is concerned may be causing the symptoms.

## 2016-06-17 NOTE — ED Notes (Signed)
Pharmacy tech at bedside 

## 2016-06-20 LAB — CULTURE, GROUP A STREP (THRC)

## 2016-06-23 NOTE — Progress Notes (Signed)
Reviewed & agree with plan  

## 2016-06-25 ENCOUNTER — Other Ambulatory Visit: Payer: Self-pay | Admitting: Sports Medicine

## 2016-06-25 DIAGNOSIS — M25512 Pain in left shoulder: Secondary | ICD-10-CM

## 2016-06-30 DIAGNOSIS — R079 Chest pain, unspecified: Secondary | ICD-10-CM | POA: Diagnosis not present

## 2016-07-01 ENCOUNTER — Ambulatory Visit
Admission: RE | Admit: 2016-07-01 | Discharge: 2016-07-01 | Disposition: A | Discharge status: Home / Self Care | Payer: Medicare Other | Source: Ambulatory Visit | Attending: Sports Medicine | Admitting: Sports Medicine

## 2016-07-01 DIAGNOSIS — M25512 Pain in left shoulder: Secondary | ICD-10-CM

## 2016-07-10 ENCOUNTER — Ambulatory Visit (INDEPENDENT_AMBULATORY_CARE_PROVIDER_SITE_OTHER): Payer: Medicare Other | Admitting: Podiatry

## 2016-07-10 ENCOUNTER — Ambulatory Visit: Payer: Medicare Other | Admitting: Podiatry

## 2016-07-10 ENCOUNTER — Encounter: Payer: Self-pay | Admitting: Podiatry

## 2016-07-10 DIAGNOSIS — S90921D Unspecified superficial injury of right foot, subsequent encounter: Secondary | ICD-10-CM

## 2016-07-10 DIAGNOSIS — M79672 Pain in left foot: Secondary | ICD-10-CM | POA: Diagnosis not present

## 2016-07-10 DIAGNOSIS — B351 Tinea unguium: Secondary | ICD-10-CM | POA: Diagnosis not present

## 2016-07-10 DIAGNOSIS — M79671 Pain in right foot: Secondary | ICD-10-CM

## 2016-07-10 MED ORDER — OXYCODONE-ACETAMINOPHEN 10-325 MG PO TABS: 1.0000 | ORAL_TABLET | Freq: Three times a day (TID) | ORAL | 0 refills | Status: DC | PRN

## 2016-07-10 NOTE — Progress Notes (Signed)
Subjective: 46 year old female presents requesting toe nails trimmed. Her last injury caused a torn rotator cuff on left shoulder. She is not able to move left arm. And is scheduled for surgery in Feb Patient request for pain medication and toe nails trimmed.  Able to walk but with pain since the fall.  Hx of a fall on11/10/17.  MRI report reveal torn Rotator cuff muscle left shoulder. Having surgery in next few weeks. Her right foot hurts to ambulate and pain is radiating to leg and hip.   Objective: Continuing foot pain since the fall occurred in, 05/07/16. Pain with weight bearing and ambulation onright foot. Previous x-ray of right foot revealed to have no acute changes.  Thick dystrophic nails x 10.   Assessment: Hyperalgia right foot. Tenosynovitis right foot. History of recent fall 05/07/16. Onychomycosis x 10.  Plan: All nails debrided. Pain medication prescribed as per request.

## 2016-07-10 NOTE — Patient Instructions (Addendum)
Seen for pain in right foot.  Having left shoulder surgery in February. All nails debrided. Rx pain medication prescribed.

## 2016-07-11 NOTE — H&P (Signed)
[facility] [address] [place], [place] [address] [phone] A Division of [facility]  RE:  Diana Henderson, Diana Henderson   [id number]   DOB: 09-Apr-1971 07-06-2016  Reason for visit:  Referral from Dr. Alfonso Ramus for a left shoulder injury.  HISTORY OF PRESENT ILLNESS: She is 45. She is on disability due to her multiple foot surgeries. She has a history of hypertension and insulin-controlled diabetes. She suffered a fall in November of 2017, had difficulty moving her shoulder, difficulty sleeping, inability to perform ADLs. She had an MRI, which demonstrated a complete tear of her supraspinatus tendon with retraction. No atrophy of the muscle. Some mild tearing at the top of her subscapularis and in front of her infraspinatus. She had tried activity modification without any relief.  PHYSICAL EXAM: Well appearing female, in no apparent distress. She has limited passive range of motion, very weak in her supraspinatus, and tenderness around her shoulder.  IMAGING: MRI results as noted above.   ASSESSMENT & PLAN: She has failed management of a supraspinatus tear. I would recommend arthroscopic subacromial decompression, extensive debridement, manipulation and rotator cuff repair. She would like to go forward with this. I discussed need for therapy afterwards and risks and benefits.   Ernesta Amble.  Percell Miller, M.D.   Electronically verified by Ernesta Amble. Percell Miller, M.D.  Jolyn Lent D 07-06-2016 T 07-09-2016

## 2016-07-12 ENCOUNTER — Ambulatory Visit: Payer: Medicare Other | Admitting: Podiatry

## 2016-07-23 NOTE — Progress Notes (Signed)
Chart reviewed by Dr Smith Robert, due to multiple uncontrolled comorbibities, patient will need to be done at main OR. Claiborne Billings at Dr [facility] office notified.

## 2016-07-27 ENCOUNTER — Encounter (HOSPITAL_BASED_OUTPATIENT_CLINIC_OR_DEPARTMENT_OTHER): Payer: Self-pay

## 2016-07-27 ENCOUNTER — Ambulatory Visit (HOSPITAL_BASED_OUTPATIENT_CLINIC_OR_DEPARTMENT_OTHER): Admit: 2016-07-27 | Payer: Medicare Other | Admitting: Orthopedic Surgery

## 2016-07-27 SURGERY — SHOULDER ARTHROSCOPY WITH SUBACROMIAL DECOMPRESSION AND OPEN ROTATOR CUFF REPAIR, OPEN BICEPS TENDON REPAIR
Anesthesia: General | Laterality: Left

## 2016-07-30 ENCOUNTER — Encounter (HOSPITAL_COMMUNITY): Payer: Self-pay | Admitting: *Deleted

## 2016-07-30 MED ORDER — DEXTROSE 5 % IV SOLN
3.0000 g | INTRAVENOUS | Status: AC
  Administered 2016-07-31: 3 g via INTRAVENOUS
  Filled 2016-07-30: qty 3000

## 2016-07-31 ENCOUNTER — Ambulatory Visit (HOSPITAL_COMMUNITY): Payer: Medicare Other | Admitting: Anesthesiology

## 2016-07-31 ENCOUNTER — Encounter (HOSPITAL_COMMUNITY)
Admission: RE | Disposition: A | Discharge status: Home / Self Care | Payer: Self-pay | Source: Ambulatory Visit | Attending: Orthopedic Surgery

## 2016-07-31 ENCOUNTER — Observation Stay (HOSPITAL_COMMUNITY)
Admission: RE | Admit: 2016-07-31 | Discharge: 2016-08-01 | Disposition: A | Discharge status: Home / Self Care | Payer: Medicare Other | Source: Ambulatory Visit | Attending: Orthopedic Surgery | Admitting: Orthopedic Surgery

## 2016-07-31 ENCOUNTER — Encounter (HOSPITAL_COMMUNITY): Payer: Self-pay | Admitting: Certified Registered Nurse Anesthetist

## 2016-07-31 DIAGNOSIS — M7521 Bicipital tendinitis, right shoulder: Secondary | ICD-10-CM | POA: Insufficient documentation

## 2016-07-31 DIAGNOSIS — M75101 Unspecified rotator cuff tear or rupture of right shoulder, not specified as traumatic: Secondary | ICD-10-CM | POA: Diagnosis not present

## 2016-07-31 DIAGNOSIS — F061 Catatonic disorder due to known physiological condition: Secondary | ICD-10-CM

## 2016-07-31 DIAGNOSIS — K219 Gastro-esophageal reflux disease without esophagitis: Secondary | ICD-10-CM | POA: Insufficient documentation

## 2016-07-31 DIAGNOSIS — E1165 Type 2 diabetes mellitus with hyperglycemia: Secondary | ICD-10-CM

## 2016-07-31 DIAGNOSIS — Z888 Allergy status to other drugs, medicaments and biological substances status: Secondary | ICD-10-CM | POA: Diagnosis not present

## 2016-07-31 DIAGNOSIS — J45909 Unspecified asthma, uncomplicated: Secondary | ICD-10-CM

## 2016-07-31 DIAGNOSIS — E108 Type 1 diabetes mellitus with unspecified complications: Secondary | ICD-10-CM

## 2016-07-31 DIAGNOSIS — I110 Hypertensive heart disease with heart failure: Secondary | ICD-10-CM | POA: Diagnosis not present

## 2016-07-31 DIAGNOSIS — G4733 Obstructive sleep apnea (adult) (pediatric): Secondary | ICD-10-CM | POA: Diagnosis not present

## 2016-07-31 DIAGNOSIS — Z91040 Latex allergy status: Secondary | ICD-10-CM | POA: Diagnosis not present

## 2016-07-31 DIAGNOSIS — Z91018 Allergy to other foods: Secondary | ICD-10-CM | POA: Insufficient documentation

## 2016-07-31 DIAGNOSIS — I5032 Chronic diastolic (congestive) heart failure: Secondary | ICD-10-CM

## 2016-07-31 DIAGNOSIS — F4310 Post-traumatic stress disorder, unspecified: Secondary | ICD-10-CM | POA: Diagnosis not present

## 2016-07-31 DIAGNOSIS — Z794 Long term (current) use of insulin: Secondary | ICD-10-CM | POA: Diagnosis not present

## 2016-07-31 DIAGNOSIS — E119 Type 2 diabetes mellitus without complications: Secondary | ICD-10-CM | POA: Diagnosis not present

## 2016-07-31 DIAGNOSIS — Z9889 Other specified postprocedural states: Secondary | ICD-10-CM

## 2016-07-31 DIAGNOSIS — M75102 Unspecified rotator cuff tear or rupture of left shoulder, not specified as traumatic: Secondary | ICD-10-CM | POA: Diagnosis present

## 2016-07-31 DIAGNOSIS — E785 Hyperlipidemia, unspecified: Secondary | ICD-10-CM | POA: Diagnosis not present

## 2016-07-31 DIAGNOSIS — I509 Heart failure, unspecified: Secondary | ICD-10-CM | POA: Insufficient documentation

## 2016-07-31 DIAGNOSIS — F319 Bipolar disorder, unspecified: Secondary | ICD-10-CM

## 2016-07-31 DIAGNOSIS — E118 Type 2 diabetes mellitus with unspecified complications: Secondary | ICD-10-CM

## 2016-07-31 DIAGNOSIS — Z87891 Personal history of nicotine dependence: Secondary | ICD-10-CM | POA: Insufficient documentation

## 2016-07-31 DIAGNOSIS — E1065 Type 1 diabetes mellitus with hyperglycemia: Secondary | ICD-10-CM

## 2016-07-31 DIAGNOSIS — M1990 Unspecified osteoarthritis, unspecified site: Secondary | ICD-10-CM | POA: Insufficient documentation

## 2016-07-31 DIAGNOSIS — F339 Major depressive disorder, recurrent, unspecified: Secondary | ICD-10-CM

## 2016-07-31 DIAGNOSIS — F411 Generalized anxiety disorder: Secondary | ICD-10-CM

## 2016-07-31 DIAGNOSIS — IMO0002 Reserved for concepts with insufficient information to code with codable children: Secondary | ICD-10-CM

## 2016-07-31 DIAGNOSIS — I10 Essential (primary) hypertension: Secondary | ICD-10-CM

## 2016-07-31 HISTORY — PX: SHOULDER ARTHROSCOPY WITH ROTATOR CUFF REPAIR AND SUBACROMIAL DECOMPRESSION: SHX5686

## 2016-07-31 HISTORY — DX: Unspecified rotator cuff tear or rupture of unspecified shoulder, not specified as traumatic: M75.100

## 2016-07-31 LAB — GLUCOSE, CAPILLARY
GLUCOSE-CAPILLARY: 180 mg/dL — AB (ref 65–99)
GLUCOSE-CAPILLARY: 212 mg/dL — AB (ref 65–99)
Glucose-Capillary: 181 mg/dL — ABNORMAL HIGH (ref 65–99)
Glucose-Capillary: 218 mg/dL — ABNORMAL HIGH (ref 65–99)
Glucose-Capillary: 222 mg/dL — ABNORMAL HIGH (ref 65–99)

## 2016-07-31 LAB — HCG, SERUM, QUALITATIVE: Preg, Serum: NEGATIVE

## 2016-07-31 SURGERY — SHOULDER ARTHROSCOPY WITH ROTATOR CUFF REPAIR AND SUBACROMIAL DECOMPRESSION
Anesthesia: General | Laterality: Left

## 2016-07-31 MED ORDER — ROCURONIUM BROMIDE 100 MG/10ML IV SOLN
INTRAVENOUS | Status: DC | PRN
  Administered 2016-07-31: 50 mg via INTRAVENOUS
  Administered 2016-07-31: 20 mg via INTRAVENOUS

## 2016-07-31 MED ORDER — ONDANSETRON HCL 4 MG PO TABS: 4.0000 mg | ORAL_TABLET | Freq: Three times a day (TID) | ORAL | 0 refills | Status: DC | PRN

## 2016-07-31 MED ORDER — ACETAMINOPHEN 500 MG PO TABS
1000.0000 mg | ORAL_TABLET | Freq: Once | ORAL | Status: AC
  Administered 2016-07-31: 1000 mg via ORAL
  Filled 2016-07-31: qty 2

## 2016-07-31 MED ORDER — MIDAZOLAM HCL 2 MG/2ML IJ SOLN
INTRAMUSCULAR | Status: AC
  Filled 2016-07-31: qty 2

## 2016-07-31 MED ORDER — PHENYLEPHRINE HCL 10 MG/ML IJ SOLN
INTRAVENOUS | Status: DC | PRN
  Administered 2016-07-31: 70 ug/min via INTRAVENOUS

## 2016-07-31 MED ORDER — HYDRALAZINE HCL 20 MG/ML IJ SOLN
10.0000 mg | Freq: Four times a day (QID) | INTRAMUSCULAR | Status: DC | PRN
  Filled 2016-07-31: qty 0.5

## 2016-07-31 MED ORDER — LACTATED RINGERS IV SOLN
INTRAVENOUS | Status: DC
  Administered 2016-07-31 (×2): via INTRAVENOUS

## 2016-07-31 MED ORDER — INSULIN ASPART 100 UNIT/ML ~~LOC~~ SOLN
0.0000 [IU] | SUBCUTANEOUS | Status: DC
  Administered 2016-07-31: 8 [IU] via SUBCUTANEOUS
  Administered 2016-08-01: 4 [IU] via SUBCUTANEOUS
  Administered 2016-08-01: 8 [IU] via SUBCUTANEOUS
  Administered 2016-08-01: 4 [IU] via SUBCUTANEOUS
  Filled 2016-07-31 (×51): qty 0.24

## 2016-07-31 MED ORDER — PROPOFOL 10 MG/ML IV BOLUS
INTRAVENOUS | Status: AC
  Filled 2016-07-31: qty 20

## 2016-07-31 MED ORDER — SUGAMMADEX SODIUM 200 MG/2ML IV SOLN
INTRAVENOUS | Status: DC | PRN
  Administered 2016-07-31: 300 mg via INTRAVENOUS

## 2016-07-31 MED ORDER — METHOCARBAMOL 500 MG PO TABS
ORAL_TABLET | ORAL | Status: AC
  Filled 2016-07-31: qty 1

## 2016-07-31 MED ORDER — CHLORHEXIDINE GLUCONATE 4 % EX LIQD: 60.0000 mL | Freq: Once | CUTANEOUS | Status: DC

## 2016-07-31 MED ORDER — ENOXAPARIN SODIUM 40 MG/0.4ML ~~LOC~~ SOLN
40.0000 mg | SUBCUTANEOUS | Status: DC
  Administered 2016-08-01: 40 mg via SUBCUTANEOUS
  Filled 2016-07-31: qty 0.4

## 2016-07-31 MED ORDER — METHOCARBAMOL 500 MG PO TABS
500.0000 mg | ORAL_TABLET | Freq: Four times a day (QID) | ORAL | Status: DC | PRN
  Administered 2016-07-31 – 2016-08-01 (×2): 500 mg via ORAL
  Filled 2016-07-31 (×2): qty 1

## 2016-07-31 MED ORDER — POLYETHYLENE GLYCOL 3350 17 G PO PACK: 17.0000 g | PACK | Freq: Every day | ORAL | Status: DC | PRN

## 2016-07-31 MED ORDER — BUPIVACAINE HCL (PF) 0.25 % IJ SOLN
INTRAMUSCULAR | Status: DC | PRN
  Administered 2016-07-31: 10 mL

## 2016-07-31 MED ORDER — FLUTICASONE FUROATE-VILANTEROL 200-25 MCG/INH IN AEPB
1.0000 | INHALATION_SPRAY | Freq: Two times a day (BID) | RESPIRATORY_TRACT | Status: DC
  Administered 2016-08-01: 1 via RESPIRATORY_TRACT
  Filled 2016-07-31: qty 28

## 2016-07-31 MED ORDER — PANTOPRAZOLE SODIUM 40 MG PO TBEC: 40.0000 mg | DELAYED_RELEASE_TABLET | Freq: Every day | ORAL | Status: DC | PRN

## 2016-07-31 MED ORDER — SUCCINYLCHOLINE CHLORIDE 20 MG/ML IJ SOLN
INTRAMUSCULAR | Status: DC | PRN
  Administered 2016-07-31: 120 mg via INTRAVENOUS

## 2016-07-31 MED ORDER — HYDROMORPHONE HCL 1 MG/ML IJ SOLN
0.2500 mg | INTRAMUSCULAR | Status: DC | PRN
  Administered 2016-07-31: 0.25 mg via INTRAVENOUS

## 2016-07-31 MED ORDER — FENTANYL CITRATE (PF) 100 MCG/2ML IJ SOLN
INTRAMUSCULAR | Status: AC
  Filled 2016-07-31: qty 2

## 2016-07-31 MED ORDER — TRAZODONE HCL 50 MG PO TABS: 50.0000 mg | ORAL_TABLET | Freq: Every evening | ORAL | Status: DC | PRN

## 2016-07-31 MED ORDER — OXYCODONE HCL 5 MG PO TABS
5.0000 mg | ORAL_TABLET | ORAL | Status: DC | PRN
  Administered 2016-07-31 (×2): 5 mg via ORAL
  Administered 2016-07-31 – 2016-08-01 (×4): 10 mg via ORAL
  Filled 2016-07-31 (×2): qty 2
  Filled 2016-07-31: qty 1
  Filled 2016-07-31 (×2): qty 2

## 2016-07-31 MED ORDER — PROPOFOL 10 MG/ML IV BOLUS
INTRAVENOUS | Status: DC | PRN
  Administered 2016-07-31: 160 mg via INTRAVENOUS
  Administered 2016-07-31: 40 mg via INTRAVENOUS

## 2016-07-31 MED ORDER — CELECOXIB 200 MG PO CAPS
200.0000 mg | ORAL_CAPSULE | Freq: Two times a day (BID) | ORAL | Status: DC
  Administered 2016-07-31 – 2016-08-01 (×2): 200 mg via ORAL
  Filled 2016-07-31 (×2): qty 1

## 2016-07-31 MED ORDER — DIPHENHYDRAMINE HCL 12.5 MG/5ML PO ELIX: 12.5000 mg | ORAL_SOLUTION | ORAL | Status: DC | PRN

## 2016-07-31 MED ORDER — ONDANSETRON HCL 4 MG/2ML IJ SOLN
INTRAMUSCULAR | Status: DC | PRN
  Administered 2016-07-31: 4 mg via INTRAVENOUS

## 2016-07-31 MED ORDER — DULOXETINE HCL 60 MG PO CPEP
60.0000 mg | ORAL_CAPSULE | Freq: Every day | ORAL | Status: DC
  Administered 2016-08-01: 60 mg via ORAL
  Filled 2016-07-31 (×2): qty 1

## 2016-07-31 MED ORDER — LINAGLIPTIN 5 MG PO TABS
5.0000 mg | ORAL_TABLET | Freq: Every day | ORAL | Status: DC
  Administered 2016-08-01: 5 mg via ORAL
  Filled 2016-07-31: qty 1

## 2016-07-31 MED ORDER — GABAPENTIN 600 MG PO TABS
600.0000 mg | ORAL_TABLET | Freq: Every day | ORAL | Status: DC
  Administered 2016-07-31: 600 mg via ORAL
  Filled 2016-07-31: qty 1

## 2016-07-31 MED ORDER — SENNA 8.6 MG PO TABS
1.0000 | ORAL_TABLET | Freq: Two times a day (BID) | ORAL | Status: DC
  Administered 2016-07-31 – 2016-08-01 (×2): 8.6 mg via ORAL
  Filled 2016-07-31 (×2): qty 1

## 2016-07-31 MED ORDER — HYDROMORPHONE HCL 2 MG PO TABS: 2.0000 mg | ORAL_TABLET | Freq: Two times a day (BID) | ORAL | 0 refills | Status: DC | PRN

## 2016-07-31 MED ORDER — ALBUTEROL SULFATE (2.5 MG/3ML) 0.083% IN NEBU: 3.0000 mL | INHALATION_SOLUTION | Freq: Four times a day (QID) | RESPIRATORY_TRACT | Status: DC | PRN

## 2016-07-31 MED ORDER — METHOCARBAMOL 1000 MG/10ML IJ SOLN
500.0000 mg | Freq: Four times a day (QID) | INTRAVENOUS | Status: DC | PRN
  Filled 2016-07-31: qty 5

## 2016-07-31 MED ORDER — OXYCODONE HCL 5 MG PO TABS
ORAL_TABLET | ORAL | Status: AC
  Filled 2016-07-31: qty 1

## 2016-07-31 MED ORDER — METOCLOPRAMIDE HCL 5 MG PO TABS: 5.0000 mg | ORAL_TABLET | Freq: Three times a day (TID) | ORAL | Status: DC | PRN

## 2016-07-31 MED ORDER — HYDROMORPHONE HCL 2 MG/ML IJ SOLN: 1.0000 mg | INTRAMUSCULAR | Status: DC | PRN

## 2016-07-31 MED ORDER — LACTATED RINGERS IV SOLN
INTRAVENOUS | Status: DC
  Administered 2016-07-31 – 2016-08-01 (×2): via INTRAVENOUS

## 2016-07-31 MED ORDER — MIDAZOLAM HCL 5 MG/5ML IJ SOLN
INTRAMUSCULAR | Status: DC | PRN
  Administered 2016-07-31: 2 mg via INTRAVENOUS

## 2016-07-31 MED ORDER — BUPIVACAINE HCL (PF) 0.25 % IJ SOLN
INTRAMUSCULAR | Status: AC
  Filled 2016-07-31: qty 30

## 2016-07-31 MED ORDER — DOCUSATE SODIUM 100 MG PO CAPS
100.0000 mg | ORAL_CAPSULE | Freq: Two times a day (BID) | ORAL | Status: DC
  Administered 2016-07-31 – 2016-08-01 (×2): 100 mg via ORAL
  Filled 2016-07-31 (×2): qty 1

## 2016-07-31 MED ORDER — HYDROMORPHONE HCL 1 MG/ML IJ SOLN
INTRAMUSCULAR | Status: AC
  Administered 2016-07-31: 0.25 mg
  Filled 2016-07-31: qty 0.5

## 2016-07-31 MED ORDER — DEXAMETHASONE SODIUM PHOSPHATE 10 MG/ML IJ SOLN
INTRAMUSCULAR | Status: DC | PRN
  Administered 2016-07-31: 5 mg via INTRAVENOUS

## 2016-07-31 MED ORDER — OXYCODONE-ACETAMINOPHEN 5-325 MG PO TABS: 1.0000 | ORAL_TABLET | ORAL | 0 refills | Status: DC | PRN

## 2016-07-31 MED ORDER — SODIUM CHLORIDE 0.9 % IV SOLN
INTRAVENOUS | Status: DC | PRN
Start: 2016-07-31 — End: 2016-07-31
  Administered 2016-07-31 (×3): 3000 mL

## 2016-07-31 MED ORDER — ONDANSETRON HCL 4 MG PO TABS: 4.0000 mg | ORAL_TABLET | Freq: Four times a day (QID) | ORAL | Status: DC | PRN

## 2016-07-31 MED ORDER — METOCLOPRAMIDE HCL 5 MG/ML IJ SOLN: 5.0000 mg | Freq: Three times a day (TID) | INTRAMUSCULAR | Status: DC | PRN

## 2016-07-31 MED ORDER — 0.9 % SODIUM CHLORIDE (POUR BTL) OPTIME
TOPICAL | Status: DC | PRN
  Administered 2016-07-31: 1000 mL

## 2016-07-31 MED ORDER — ACETAMINOPHEN 650 MG RE SUPP: 650.0000 mg | Freq: Four times a day (QID) | RECTAL | Status: DC | PRN

## 2016-07-31 MED ORDER — ONDANSETRON HCL 4 MG/2ML IJ SOLN: 4.0000 mg | Freq: Four times a day (QID) | INTRAMUSCULAR | Status: DC | PRN

## 2016-07-31 MED ORDER — AMLODIPINE BESYLATE 10 MG PO TABS
10.0000 mg | ORAL_TABLET | Freq: Every day | ORAL | Status: DC
  Administered 2016-08-01: 10 mg via ORAL
  Filled 2016-07-31 (×2): qty 1

## 2016-07-31 MED ORDER — DOCUSATE SODIUM 100 MG PO CAPS: 100.0000 mg | ORAL_CAPSULE | Freq: Two times a day (BID) | ORAL | 0 refills | Status: DC

## 2016-07-31 MED ORDER — ATENOLOL 50 MG PO TABS
50.0000 mg | ORAL_TABLET | Freq: Every day | ORAL | Status: DC
  Administered 2016-08-01: 50 mg via ORAL
  Filled 2016-07-31 (×2): qty 1

## 2016-07-31 MED ORDER — ACETAMINOPHEN 325 MG PO TABS
650.0000 mg | ORAL_TABLET | Freq: Four times a day (QID) | ORAL | Status: AC
  Administered 2016-07-31 – 2016-08-01 (×3): 650 mg via ORAL
  Filled 2016-07-31 (×4): qty 2

## 2016-07-31 MED ORDER — ACETAMINOPHEN 325 MG PO TABS: 650.0000 mg | ORAL_TABLET | Freq: Four times a day (QID) | ORAL | Status: DC | PRN

## 2016-07-31 MED ORDER — CEFAZOLIN IN D5W 1 GM/50ML IV SOLN
1.0000 g | Freq: Four times a day (QID) | INTRAVENOUS | Status: AC
  Administered 2016-07-31 – 2016-08-01 (×3): 1 g via INTRAVENOUS
  Filled 2016-07-31 (×3): qty 50

## 2016-07-31 MED ORDER — LIDOCAINE HCL (CARDIAC) 20 MG/ML IV SOLN
INTRAVENOUS | Status: DC | PRN
  Administered 2016-07-31: 100 mg via INTRAVENOUS

## 2016-07-31 MED ORDER — FENTANYL CITRATE (PF) 100 MCG/2ML IJ SOLN
INTRAMUSCULAR | Status: AC
  Filled 2016-07-31: qty 4

## 2016-07-31 MED ORDER — METHYLPREDNISOLONE ACETATE 40 MG/ML IJ SUSP
INTRAMUSCULAR | Status: AC
  Filled 2016-07-31: qty 1

## 2016-07-31 MED ORDER — METHYLPREDNISOLONE ACETATE 40 MG/ML IJ SUSP
INTRAMUSCULAR | Status: DC | PRN
  Administered 2016-07-31: 40 mg

## 2016-07-31 MED ORDER — FENTANYL CITRATE (PF) 100 MCG/2ML IJ SOLN
INTRAMUSCULAR | Status: DC | PRN
  Administered 2016-07-31: 50 ug via INTRAVENOUS
  Administered 2016-07-31: 100 ug via INTRAVENOUS
  Administered 2016-07-31: 50 ug via INTRAVENOUS

## 2016-07-31 MED ORDER — STERILE WATER FOR IRRIGATION IR SOLN
Status: DC | PRN
  Administered 2016-07-31: 1000 mL

## 2016-07-31 SURGICAL SUPPLY — 52 items
ANCHOR SUT BIO SW 4.75X19.1 (Anchor) ×4 IMPLANT
BENZOIN TINCTURE PRP APPL 2/3 (GAUZE/BANDAGES/DRESSINGS) ×2 IMPLANT
BLADE CUTTER GATOR 3.5 (BLADE) ×2 IMPLANT
BUR OVAL 6.0 (BURR) ×2 IMPLANT
CANNULA 5.75X71 LONG (CANNULA) IMPLANT
CANNULA TWIST IN 8.25X7CM (CANNULA) ×2 IMPLANT
DRAPE ORTHO SPLIT 77X108 STRL (DRAPES) ×2
DRAPE STERI 35X30 U-POUCH (DRAPES) ×4 IMPLANT
DRAPE SURG ORHT 6 SPLT 77X108 (DRAPES) ×2 IMPLANT
DRAPE U-SHAPE 47X51 STRL (DRAPES) ×2 IMPLANT
DRSG EMULSION OIL 3X3 NADH (GAUZE/BANDAGES/DRESSINGS) ×2 IMPLANT
DURAPREP 26ML APPLICATOR (WOUND CARE) ×2 IMPLANT
ELECT REM PT RETURN 9FT ADLT (ELECTROSURGICAL)
ELECTRODE REM PT RTRN 9FT ADLT (ELECTROSURGICAL) IMPLANT
FLUID NSS /IRRIG 3000 ML XXX (IV SOLUTION) ×4 IMPLANT
GAUZE SPONGE 4X4 12PLY STRL (GAUZE/BANDAGES/DRESSINGS) ×4 IMPLANT
GLOVE BIO SURGEON STRL SZ7 (GLOVE) ×2 IMPLANT
GLOVE BIO SURGEON STRL SZ7.5 (GLOVE) ×4 IMPLANT
GLOVE BIOGEL PI IND STRL 7.0 (GLOVE) ×1 IMPLANT
GLOVE BIOGEL PI IND STRL 8 (GLOVE) ×2 IMPLANT
GLOVE BIOGEL PI INDICATOR 7.0 (GLOVE) ×1
GLOVE BIOGEL PI INDICATOR 8 (GLOVE) ×2
GOWN STRL REUS W/ TWL LRG LVL3 (GOWN DISPOSABLE) ×2 IMPLANT
GOWN STRL REUS W/ TWL XL LVL3 (GOWN DISPOSABLE) ×1 IMPLANT
GOWN STRL REUS W/TWL LRG LVL3 (GOWN DISPOSABLE) ×2
GOWN STRL REUS W/TWL XL LVL3 (GOWN DISPOSABLE) ×1
KIT BASIN OR (CUSTOM PROCEDURE TRAY) ×2 IMPLANT
MANIFOLD NEPTUNE II (INSTRUMENTS) ×2 IMPLANT
NEEDLE SCORPION MULTI FIRE (NEEDLE) ×2 IMPLANT
NS IRRIG 1000ML POUR BTL (IV SOLUTION) ×2 IMPLANT
PACK ARTHROSCOPY DSU (CUSTOM PROCEDURE TRAY) ×2 IMPLANT
PACK SHOULDER (CUSTOM PROCEDURE TRAY) ×2 IMPLANT
PAD ABD 8X10 STRL (GAUZE/BANDAGES/DRESSINGS) ×2 IMPLANT
PROBE BIPOLAR ATHRO 135MM 90D (MISCELLANEOUS) ×2 IMPLANT
SET ARTHROSCOPY TUBING (MISCELLANEOUS) ×1
SET ARTHROSCOPY TUBING LN (MISCELLANEOUS) ×1 IMPLANT
SLING ARM FOAM STRAP XLG (SOFTGOODS) ×2 IMPLANT
SLING ARM IMMOBILIZER LRG (SOFTGOODS) IMPLANT
SLING ARM IMMOBILIZER MED (SOFTGOODS) IMPLANT
SLING ARM MED ADULT FOAM STRAP (SOFTGOODS) IMPLANT
SLING ARM XL FOAM STRAP (SOFTGOODS) ×2 IMPLANT
SPONGE GAUZE 4X4 12PLY STER LF (GAUZE/BANDAGES/DRESSINGS) ×2 IMPLANT
SPONGE LAP 4X18 X RAY DECT (DISPOSABLE) IMPLANT
STRIP CLOSURE SKIN 1/2X4 (GAUZE/BANDAGES/DRESSINGS) IMPLANT
SUT ETHILON 3 0 FSL (SUTURE) ×2 IMPLANT
SUT ETHILON 3 0 PS 1 (SUTURE) ×4 IMPLANT
SUT TIGER TAPE 7 IN WHITE (SUTURE) IMPLANT
TAPE FIBER 2MM 7IN #2 BLUE (SUTURE) ×4 IMPLANT
TAPE PAPER 3X10 WHT MICROPORE (GAUZE/BANDAGES/DRESSINGS) ×2 IMPLANT
TOWEL OR 17X24 6PK STRL BLUE (TOWEL DISPOSABLE) ×2 IMPLANT
WAND HAND CNTRL MULTIVAC 90 (MISCELLANEOUS) ×2 IMPLANT
WATER STERILE IRR 3000ML UROMA (IV SOLUTION) ×2 IMPLANT

## 2016-07-31 NOTE — Discharge Instructions (Signed)
Diet: As you were doing prior to hospitalization   Dressing:  You may change your dressing 3-5 days after surgery and shower over incisions.  No bath or submerging wound.    Activity:  Increase activity slowly as tolerated, but follow the weight bearing instructions below.  The rules on driving is that you can not be taking narcotics while you drive, and you must feel in control of the vehicle.    Weight Bearing:   Non weight bearing left arm.  Wear sling at all times.  To prevent constipation: you may use a stool softener such as -  Colace (over the counter) 100 mg by mouth twice a day  Drink plenty of fluids (prune juice may be helpful) and high fiber foods Miralax (over the counter) for constipation as needed.    Itching:  If you experience itching with your medications, try taking only a single pain pill, or even half a pain pill at a time.  You may take up to 10 pain pills per day, and you can also use benadryl over the counter for itching or also to help with sleep.   Other home medicines: You have several muscle spasm medications previously prescribed.  Take care when/if taking these medicines as they may exacerbate drowsiness.  Additional muscle spasm medications have not been prescribed by Dr. Alain [place].  Precautions:  If you experience chest pain or shortness of breath - call 911 immediately for transfer to the hospital emergency department!!  If you develop a fever greater that 101 F, purulent drainage from wound, increased redness or drainage from wound, or calf pain -- Call the office at [phone]                                                Follow- Up Appointment:  Please call for an appointment to be seen in 2 weeks [place] - [phone]

## 2016-07-31 NOTE — Anesthesia Postprocedure Evaluation (Signed)
Anesthesia Post Note  Patient: Diana Henderson  Procedure(s) Performed: Procedure(s) (LRB): LEFT SHOULDER ARTHROSCOPY WITH ROTATOR CUFF REPAIR (Left)  Patient location during evaluation: PACU Anesthesia Type: General Level of consciousness: awake Pain management: pain level controlled Respiratory status: spontaneous breathing Cardiovascular status: stable Anesthetic complications: no       Last Vitals:  Vitals:   07/31/16 1245 07/31/16 1340  BP:  (!) 144/88  Pulse: 94 87  Resp: 19 18  Temp: 36.1 C 36.9 C    Last Pain:  Vitals:   07/31/16 1340  TempSrc: Oral  PainSc:                  Terryann Verbeek

## 2016-07-31 NOTE — H&P (View-Only) (Signed)
[facility] [address] [place], [place] [address] [phone] A Division of [facility]  RE:  Diana Henderson, Diana Henderson   [id number]   DOB: 10-30-1970 07-06-2016  Reason for visit:  Referral from Dr. Alfonso Ramus for a left shoulder injury.  HISTORY OF PRESENT ILLNESS: She is 45. She is on disability due to her multiple foot surgeries. She has a history of hypertension and insulin-controlled diabetes. She suffered a fall in November of 2017, had difficulty moving her shoulder, difficulty sleeping, inability to perform ADLs. She had an MRI, which demonstrated a complete tear of her supraspinatus tendon with retraction. No atrophy of the muscle. Some mild tearing at the top of her subscapularis and in front of her infraspinatus. She had tried activity modification without any relief.  PHYSICAL EXAM: Well appearing female, in no apparent distress. She has limited passive range of motion, very weak in her supraspinatus, and tenderness around her shoulder.  IMAGING: MRI results as noted above.   ASSESSMENT & PLAN: She has failed management of a supraspinatus tear. I would recommend arthroscopic subacromial decompression, extensive debridement, manipulation and rotator cuff repair. She would like to go forward with this. I discussed need for therapy afterwards and risks and benefits.   Ernesta Amble.  Percell Miller, M.D.   Electronically verified by Ernesta Amble. Percell Miller, M.D.  Jolyn Lent D 07-06-2016 T 07-09-2016

## 2016-07-31 NOTE — Anesthesia Procedure Notes (Signed)
Procedure Name: Intubation Date/Time: 07/31/2016 9:58 AM Performed by: Salli Quarry Daiton Cowles Pre-anesthesia Checklist: Patient identified, Emergency Drugs available, Suction available and Patient being monitored Patient Re-evaluated:Patient Re-evaluated prior to inductionOxygen Delivery Method: Circle System Utilized Preoxygenation: Pre-oxygenation with 100% oxygen Intubation Type: IV induction Ventilation: Mask ventilation without difficulty Laryngoscope Size: Mac and 3 Grade View: Grade I Tube type: Oral Tube size: 7.0 mm Number of attempts: 1 Airway Equipment and Method: Stylet Placement Confirmation: ETT inserted through vocal cords under direct vision,  positive ETCO2 and breath sounds checked- equal and bilateral Secured at: 23 cm Tube secured with: Tape Dental Injury: Teeth and Oropharynx as per pre-operative assessment

## 2016-07-31 NOTE — Interval H&P Note (Signed)
History and Physical Interval Note:  07/31/2016 9:14 AM  Diana Henderson  has presented today for surgery, with the diagnosis of LEFT SHOULDER ROTATOR CUFF TEAR  The various methods of treatment have been discussed with the patient and family. After consideration of risks, benefits and other options for treatment, the patient has consented to  Procedure(s): LEFT SHOULDER ARTHROSCOPY WITH ROTATOR CUFF REPAIR (Left) as a surgical intervention .  The patient's history has been reviewed, patient examined, no change in status, stable for surgery.  I have reviewed the patient's chart and labs.  Questions were answered to the patient's satisfaction.     Jerald Hennington D

## 2016-07-31 NOTE — Transfer of Care (Signed)
Immediate Anesthesia Transfer of Care Note  Patient: Diana Henderson  Procedure(s) Performed: Procedure(s): LEFT SHOULDER ARTHROSCOPY WITH ROTATOR CUFF REPAIR (Left)  Patient Location: PACU  Anesthesia Type:General  Level of Consciousness: awake, alert  and patient cooperative  Airway & Oxygen Therapy: Patient Spontanous Breathing and Patient connected to nasal cannula oxygen  Post-op Assessment: Report given to RN and Post -op Vital signs reviewed and stable  Post vital signs: Reviewed and stable  Last Vitals:  Vitals:   07/31/16 0820 07/31/16 0832  BP: (!) 160/102 (!) 159/81  Pulse: 75   Resp: 16   Temp: 36.9 C     Last Pain:  Vitals:   07/31/16 0835  TempSrc:   PainSc: 9       Patients Stated Pain Goal: 4 ([id number])  Complications: No apparent anesthesia complications

## 2016-07-31 NOTE — Op Note (Signed)
07/31/2016  11:28 AM  PATIENT:  Diana Henderson    PRE-OPERATIVE DIAGNOSIS:  LEFT SHOULDER ROTATOR CUFF TEAR  POST-OPERATIVE DIAGNOSIS:  Same  PROCEDURE:  LEFT SHOULDER ARTHROSCOPY WITH ROTATOR CUFF REPAIR  SURGEON:  Roshini Fulwider, Ernesta Amble, MD  ASSISTANT: Roxan Hockey, PA-C, he was present and scrubbed throughout the case, critical for completion in a timely fashion, and for retraction, instrumentation, and closure.   ANESTHESIA:   General  PREOPERATIVE INDICATIONS:  Diana Henderson is a  46 y.o. female with a diagnosis of LEFT SHOULDER ROTATOR CUFF TEAR who failed conservative measures and elected for surgical management.    The risks benefits and alternatives were discussed with the patient preoperatively including but not limited to the risks of infection, bleeding, nerve injury, cardiopulmonary complications, the need for revision surgery, among others, and the patient was willing to proceed.  OPERATIVE IMPLANTS: 2 anchors  OPERATIVE FINDINGS: supraspinatus tear, bi tendonitis  BLOOD LOSS: minimal  COMPLICATIONS: none  OPERATIVE PROCEDURE:  Patient was identified in the preoperative holding area and site was marked by me He was transported to the operating theater and placed on the table in beach chair position taking care to pad all bony prominences. After a preincinduction time out anesthesia was induced. The left upper extremity was prepped and draped in normal sterile fashion and a pre-incision timeout was performed. Diana Henderson received ancef for preoperative antibiotics.   I performed a firm but gentle manipulation under anesthesia and had palpable breakup of scar tissue and improvement in range of motion.  Initially made a posterior arthroscopic portal and inserted the arthroscope into the glenohumeral joint. tour of the joint demonstrated the above operative findings  I created an anterior portal just lateral to the coracoid under direct visualization using  a spinal needle.  I performed an extensive debridement of the scarred synovial tissue and remaining structures   I used a combination of biter and shaver to release the biceps tendon from the superior labrum and then used the shaver to debride the superior labrum to a smooth rim.  I then introduced the arthroscope into the subacromial space and brought the shaver into the anterior portal. I debrided the bursa for appropriate visualization.  I then performed a subacromial decompression using combination of the shaver ArthroCare and burr using a cutting block technique. As happy with the final elevation of the subacromial space on multiple portal views.   I debrided the rotator cuff tear and examined its mobility. There was a roughly 15 millimeters tear and I debrided the tear here. I then debrided the footprint to good bone for placement of the tendon. Next I marked the spot for the push locks. I used the scorpion to pass a horizontal mattress stitch through the tendon with even spread. I placed this down to bone with good bite on the SL anchor.  Next I passed a second more anterior horizontal stitch and placed a second Anchor with another good bite.   I was happy with the tendon apposition and there was minimal to no dog ear.  Next I removed all arthroscopic equipment expressed all fluid and closed the portals with a nylon stitch. A sterile dressing was applied the patient was taken the PACU in stable condition.  POST OPERATIVE PLAN: The patient will be in a sling full-time and keep the dressings clean dry and intact. DVT prophylaxis will consist of early ambulation

## 2016-07-31 NOTE — Anesthesia Preprocedure Evaluation (Addendum)
Anesthesia Evaluation  Patient identified by MRN, date of birth, ID band Patient awake    Reviewed: Allergy & Precautions, NPO status , Patient's Chart, lab work & pertinent test results  History of Anesthesia Complications (+) PSEUDOCHOLINESTERASE DEFICIENCY  Airway Mallampati: II  TM Distance: >3 FB     Dental   Pulmonary shortness of breath, asthma , sleep apnea , pneumonia, former smoker,    breath sounds clear to auscultation       Cardiovascular hypertension, +CHF   Rhythm:Regular Rate:Normal     Neuro/Psych  Headaches,  Neuromuscular disease    GI/Hepatic Neg liver ROS, GERD  ,  Endo/Other  diabetes  Renal/GU Renal disease     Musculoskeletal  (+) Arthritis ,   Abdominal   Peds  Hematology   Anesthesia Other Findings   Reproductive/Obstetrics                            Anesthesia Physical Anesthesia Plan  ASA: III  Anesthesia Plan: General   Post-op Pain Management:    Induction: Intravenous  Airway Management Planned: Oral ETT  Additional Equipment:   Intra-op Plan:   Post-operative Plan: Possible Post-op intubation/ventilation  Informed Consent: I have reviewed the patients History and Physical, chart, labs and discussed the procedure including the risks, benefits and alternatives for the proposed anesthesia with the patient or authorized representative who has indicated his/her understanding and acceptance.   Dental advisory given  Plan Discussed with: CRNA and Anesthesiologist  Anesthesia Plan Comments:         Anesthesia Quick Evaluation

## 2016-08-01 ENCOUNTER — Ambulatory Visit: Payer: Medicare Other | Attending: Orthopedic Surgery

## 2016-08-01 ENCOUNTER — Encounter (HOSPITAL_COMMUNITY): Payer: Self-pay | Admitting: *Deleted

## 2016-08-01 DIAGNOSIS — M75101 Unspecified rotator cuff tear or rupture of right shoulder, not specified as traumatic: Secondary | ICD-10-CM | POA: Diagnosis not present

## 2016-08-01 LAB — GLUCOSE, CAPILLARY
GLUCOSE-CAPILLARY: 212 mg/dL — AB (ref 65–99)
GLUCOSE-CAPILLARY: 162 mg/dL — AB (ref 65–99)

## 2016-08-01 NOTE — Discharge Summary (Signed)
Discharge Summary  Patient ID: Diana Henderson MRN: [id number] DOB/AGE: [birth year] 46 y.o.  Admit date: 07/31/2016 Discharge date: 08/01/2016  Admission Diagnoses:  Left rotator cuff tear  Discharge Diagnoses:  Principal Problem:   Left rotator cuff tear   Past Medical History:  Diagnosis Date  . Anxiety   . Arthritis    osteoarthritis   . Asthma    has been eval. by Dr. Gwenette Greet- last 06/2014  . Bipolar 1 disorder ([place])   . Complication of anesthesia    09/13/14: malignant HTN with inducction, case cx but no sourse identified; 11/05/14 remifentanyl infusion used to attenuate hypergynamic induction  . Depression   . Diabetes mellitus   . GERD (gastroesophageal reflux disease)   . Headache    sometimes has migraines   . Hyperlipidemia   . Hypertension   . Knee pain, chronic   . OSA on CPAP   . Pain in joint, ankle and foot 02/17/2013  . Pneumonia    hosp. for a couple of day- not sure when it was   . PTSD (post-traumatic stress disorder)   . Rotator cuff tear    bicep tendon   . Shortness of breath dyspnea   . Sleep apnea    uses CPAP intermittently, pt. doesn't remember where she had the study    Surgeries: Procedure(s): LEFT SHOULDER ARTHROSCOPY WITH ROTATOR CUFF REPAIR on 07/31/2016   Consultants (if any):   Discharged Condition: Improved  Hospital Course: Diana Henderson is an 46 y.o. female who was admitted 07/31/2016 with a diagnosis of Left rotator cuff tear and went to the operating room on 07/31/2016 and underwent the above named procedures.    She was given perioperative antibiotics:  Anti-infectives    Start     Dose/Rate Route Frequency Ordered Stop   07/31/16 1500  ceFAZolin (ANCEF) IVPB 1 g/50 mL premix     1 g 100 mL/hr over 30 Minutes Intravenous Every 6 hours 07/31/16 1326 08/01/16 0301   07/31/16 0945  ceFAZolin (ANCEF) 3 g in dextrose 5 % 50 mL IVPB     3 g 130 mL/hr over 30 Minutes Intravenous To Short Stay 07/30/16 0959 07/31/16 1014     .  She was given sequential compression devices, early ambulation for DVT prophylaxis.  She benefited maximally from the hospital stay and there were no complications.    Recent vital signs:  Vitals:   07/31/16 2000 08/01/16 0400  BP: (!) 179/73 118/79  Pulse: 99 90  Resp:    Temp: 98.3 F (36.8 C) 98.2 F (36.8 C)    Recent laboratory studies:  Lab Results  Component Value Date   HGB 14.4 03/29/2016   HGB 17.0 (H) 03/29/2016   HGB 16.3 (H) 02/05/2016   Lab Results  Component Value Date   WBC 5.6 03/29/2016   PLT 236 03/29/2016   Lab Results  Component Value Date   INR 0.99 05/16/2014   Lab Results  Component Value Date   NA 132 (L) 03/29/2016   K 4.7 03/29/2016   CL 97 (L) 03/29/2016   CO2 27 03/29/2016   BUN 5 (L) 03/29/2016   CREATININE 1.06 (H) 03/29/2016   GLUCOSE 461 (H) 03/29/2016    Discharge Medications:   Allergies as of 08/01/2016      Reactions   Celery Oil Hives   Pork-derived Products Nausea And Vomiting, Other (See Comments)   headache   Latex Itching   Powdered latex gloves when patient wears.  Tape Itching, Rash   PLEASE USE COBAN WRAP, IF POSSIBLE      Medication List    STOP taking these medications   acetaminophen 160 MG/5ML elixir Commonly known as:  TYLENOL   cyclobenzaprine 10 MG tablet Commonly known as:  FLEXERIL   HYDROcodone-homatropine 5-1.5 MG/5ML syrup Commonly known as:  HYCODAN   lactulose 10 GM/15ML solution Commonly known as:  CHRONULAC   meloxicam 7.5 MG tablet Commonly known as:  MOBIC   naproxen 500 MG tablet Commonly known as:  NAPROSYN   oxyCODONE-acetaminophen 10-325 MG tablet Commonly known as:  PERCOCET Replaced by:  oxyCODONE-acetaminophen 5-325 MG tablet   trimethoprim-polymyxin b ophthalmic solution Commonly known as:  POLYTRIM     TAKE these medications   amLODipine 10 MG tablet Commonly known as:  NORVASC Take 10 mg by mouth daily.   atenolol 50 MG tablet Commonly known as:   TENORMIN Take 1 tablet (50 mg total) by mouth daily.   budesonide-formoterol 160-4.5 MCG/ACT inhaler Commonly known as:  SYMBICORT Inhale 2 puffs into the lungs 2 (two) times daily. What changed:  Another medication with the same name was removed. Continue taking this medication, and follow the directions you see here.   diazepam 5 MG tablet Commonly known as:  VALIUM Take 1 tablet (5 mg total) by mouth every 6 (six) hours as needed for muscle spasms.   docusate sodium 100 MG capsule Commonly known as:  COLACE Take 1 capsule (100 mg total) by mouth 2 (two) times daily. To prevent constipation while taking pain medication.   DULoxetine 60 MG capsule Commonly known as:  CYMBALTA Take 60 mg by mouth daily.   fluticasone 50 MCG/ACT nasal spray Commonly known as:  FLONASE Place 1 spray into both nostrils 2 (two) times daily. What changed:  when to take this  reasons to take this   gabapentin 600 MG tablet Commonly known as:  NEURONTIN Take 1 tablet (600 mg total) by mouth daily. What changed:  when to take this   ibuprofen 800 MG tablet Commonly known as:  ADVIL,MOTRIN TAKE 1 TABLET(800 MG) BY MOUTH EVERY 8 HOURS AS NEEDED What changed:  See the new instructions.   insulin aspart protamine - aspart (70-30) 100 UNIT/ML FlexPen Commonly known as:  NOVOLOG MIX 70/30 FLEXPEN Inject 0.3 mLs (30 Units total) into the skin 2 (two) times daily with a meal. What changed:  how much to take   linagliptin 5 MG Tabs tablet Commonly known as:  TRADJENTA Take 1 tablet (5 mg total) by mouth daily with breakfast.   nabumetone 500 MG tablet Commonly known as:  RELAFEN Take 1 tablet (500 mg total) by mouth 2 (two) times daily.   omeprazole 20 MG capsule Commonly known as:  PRILOSEC Take 20 mg by mouth daily as needed (HEARTBURN).   ondansetron 4 MG tablet Commonly known as:  ZOFRAN Take 1 tablet (4 mg total) by mouth every 8 (eight) hours as needed for nausea or vomiting.    oxyCODONE-acetaminophen 5-325 MG tablet Commonly known as:  ROXICET Take 1-2 tablets by mouth every 4 (four) hours as needed for severe pain. Replaces:  oxyCODONE-acetaminophen 10-325 MG tablet   polyethylene glycol packet Commonly known as:  MIRALAX / GLYCOLAX Take 17 g by mouth daily as needed for mild constipation.   tiZANidine 4 MG tablet Commonly known as:  ZANAFLEX Take 1 tablet (4 mg total) by mouth every 8 (eight) hours as needed for muscle spasms.   traZODone 50 MG tablet  Commonly known as:  DESYREL Take 50 mg by mouth at bedtime.   VENTOLIN HFA 108 (90 Base) MCG/ACT inhaler Generic drug:  albuterol INHALE 1 TO 2 PUFFS INTO THE LUNGS EVERY 6 (SIX) HOURS AS NEEDED FOR WHEEZING OR SHORTNESS OF BREATH. What changed:  See the new instructions.       Diagnostic Studies: No results found.  Disposition: 01-Home or Self Care - Outpatient PT starting today.  Discharge Instructions    Discharge patient    Complete by:  As directed    Discharge disposition:  01-Home or Self Care   Discharge patient date:  08/01/2016      Follow-up Information    MURPHY, Ernesta Amble, MD Follow up.   Specialty:  Orthopedic Surgery Contact information: [address] [address] [phone]           Signed: Prudencio Burly III PA-C 08/01/2016, 7:36 AM

## 2016-08-01 NOTE — Care Management Obs Status (Signed)
[facility] NOTIFICATION   Patient Details  Name: Diana Henderson MRN: [id number] Date of Birth: 05-Sep-1970   Medicare Observation Status Notification Given:   (Patient was discharged prior to CM giving obs letter)    Loletha Grayer Beverely Pace, RN 08/01/2016, 4:10 PM

## 2016-08-02 ENCOUNTER — Encounter (HOSPITAL_COMMUNITY): Payer: Self-pay | Admitting: Orthopedic Surgery

## 2016-08-08 ENCOUNTER — Ambulatory Visit (INDEPENDENT_AMBULATORY_CARE_PROVIDER_SITE_OTHER): Payer: Medicare Other | Admitting: Podiatry

## 2016-08-08 DIAGNOSIS — S99921A Unspecified injury of right foot, initial encounter: Secondary | ICD-10-CM

## 2016-08-08 DIAGNOSIS — R262 Difficulty in walking, not elsewhere classified: Secondary | ICD-10-CM | POA: Diagnosis not present

## 2016-08-08 MED ORDER — OXYCODONE-ACETAMINOPHEN 10-325 MG PO TABS: 1.0000 | ORAL_TABLET | Freq: Three times a day (TID) | ORAL | 0 refills | Status: DC | PRN

## 2016-08-08 NOTE — Progress Notes (Signed)
  Subjective: 46 year old female presents complaining of pain in right foot and request for pain medication.  Able to walk better since last visit.  Had left shoulder surgery in July 31, 2016 for torn Rotator cuff muscle. Getting therapy 3 days a week at this time. Otherwise getting around ok. Foot pain is the same.  Hx of a fall on11/10/17 and injured left shoulder and knee.  Foot pain is slightly better.  Objective: Continuing foot pain since the fall occurred in, 05/07/16. No edema or erythema noted on right foot. Pain with weight bearing and ambulation onright foot. Previous x-ray of right foot revealed to have no acute changes.   Assessment: Hyperalgia right foot. Tenosynovitis right foot. History of recent fall 05/07/16.  Plan: Pain medication prescribed as per request.

## 2016-08-08 NOTE — Patient Instructions (Signed)
Seen for pain in right foot. Had left shoulder surgery. Did not get any relief with Oxycodone 5/325. Rx. Oxycodone 10/325 prescribed as per request. Return as needed.

## 2016-08-09 ENCOUNTER — Encounter: Payer: Self-pay | Admitting: Podiatry

## 2016-08-23 ENCOUNTER — Encounter: Payer: Self-pay | Admitting: Podiatry

## 2016-08-23 ENCOUNTER — Ambulatory Visit (INDEPENDENT_AMBULATORY_CARE_PROVIDER_SITE_OTHER): Payer: Medicare Other | Admitting: Podiatry

## 2016-08-23 DIAGNOSIS — R600 Localized edema: Secondary | ICD-10-CM

## 2016-08-23 DIAGNOSIS — M79671 Pain in right foot: Secondary | ICD-10-CM

## 2016-08-23 DIAGNOSIS — M79672 Pain in left foot: Secondary | ICD-10-CM | POA: Diagnosis not present

## 2016-08-23 MED ORDER — OXYCODONE-ACETAMINOPHEN 10-325 MG PO TABS: 1.0000 | ORAL_TABLET | Freq: Three times a day (TID) | ORAL | 0 refills | Status: DC | PRN

## 2016-08-23 NOTE — Patient Instructions (Addendum)
Pain in both feet with swelling problem. No other problems identified. May benefit from compression socks.  Pain medication prescribed as requested. Return as needed.

## 2016-08-23 NOTE — Progress Notes (Signed)
Subjective: 46 year old female presents complaining of pain and swelling on both feet. Doing well recovering from left shoulder surgery.   Had left shoulder surgery in July 31, 2016 for torn Rotator cuff muscle. Getting therapy 3 days a week at this time. Otherwise getting around ok. Foot pain is the same.  Hx of a fall on11/10/17 and injured left shoulder and knee.  Foot pain is slightly better.  Objective: Continuing foot pain since the fall occurred in, 05/07/16. Bilateral lower limb edema, foot and leg. Previous x-ray of right foot revealed to have no acute changes.   Assessment: Pain and swelling on both lower limbs. History of recent fall 05/07/16.  Plan: Pain medication prescribed as per request.

## 2016-09-04 ENCOUNTER — Ambulatory Visit (INDEPENDENT_AMBULATORY_CARE_PROVIDER_SITE_OTHER): Payer: Medicare Other | Admitting: Podiatry

## 2016-09-04 ENCOUNTER — Encounter: Payer: Self-pay | Admitting: Podiatry

## 2016-09-04 DIAGNOSIS — R600 Localized edema: Secondary | ICD-10-CM

## 2016-09-04 DIAGNOSIS — M79672 Pain in left foot: Secondary | ICD-10-CM | POA: Diagnosis not present

## 2016-09-04 DIAGNOSIS — M659 Synovitis and tenosynovitis, unspecified: Secondary | ICD-10-CM | POA: Diagnosis not present

## 2016-09-04 DIAGNOSIS — M79671 Pain in right foot: Secondary | ICD-10-CM

## 2016-09-04 MED ORDER — OXYCODONE-ACETAMINOPHEN 10-325 MG PO TABS: 1.0000 | ORAL_TABLET | Freq: Two times a day (BID) | ORAL | 0 refills | Status: DC | PRN

## 2016-09-04 NOTE — Progress Notes (Signed)
Subjective: 46 year old female presents complaining of pain and swelling on both feet. Weather is bad and her feet are hurting more. Doing well recovering from left shoulder surgery and still going through therapy. Patient request for pain medication prescribed.  Had left shoulder surgery in July 31, 2016 for torn Rotator cuff muscle. Getting therapy 3 days a week at this time. Otherwise getting around ok. Foot pain is the same.  Hx of a fall on11/10/17 and injured left shoulder and knee.  Foot pain is slightly better.  Objective: Continuing foot pain since the fall occurred in, 05/07/16. Bilateral lower limb edema, foot and leg. Previousx-ray of right foot revealed to have no acute changes.   Assessment: Pain and swelling on both lower limbs. History of recent fall 05/07/16.  Plan: Last pain medication was prescribed on 08/23/16, #60, q 8 hr Percocet 10/325. Counseling done to decrease pain medication. Same dose prescribed with q 12 hr interval this time. Patient understands this prescription should last a month before any further request.

## 2016-09-04 NOTE — Patient Instructions (Addendum)
Seen for pain and swelling in foot, knee, and shoulder. Last prescription was written on 3/08 and too soon for another Rx. Due to the nature of pain in elsewhere, pain medication prescribed with decreased interval, q 12 hour prn. Next available prescription would be April 20th, 2018. Return as needed.

## 2016-09-05 ENCOUNTER — Ambulatory Visit: Payer: Medicare Other | Admitting: Podiatry

## 2016-09-05 IMAGING — CR DG CHEST 2V
2 series · 2 of 2 positions shown · non-contrast
Comparison: 05/16/2014.

CLINICAL DATA: Diabetes.  History of asthma.

EXAM:
CHEST  2 VIEW

[w chest pa]
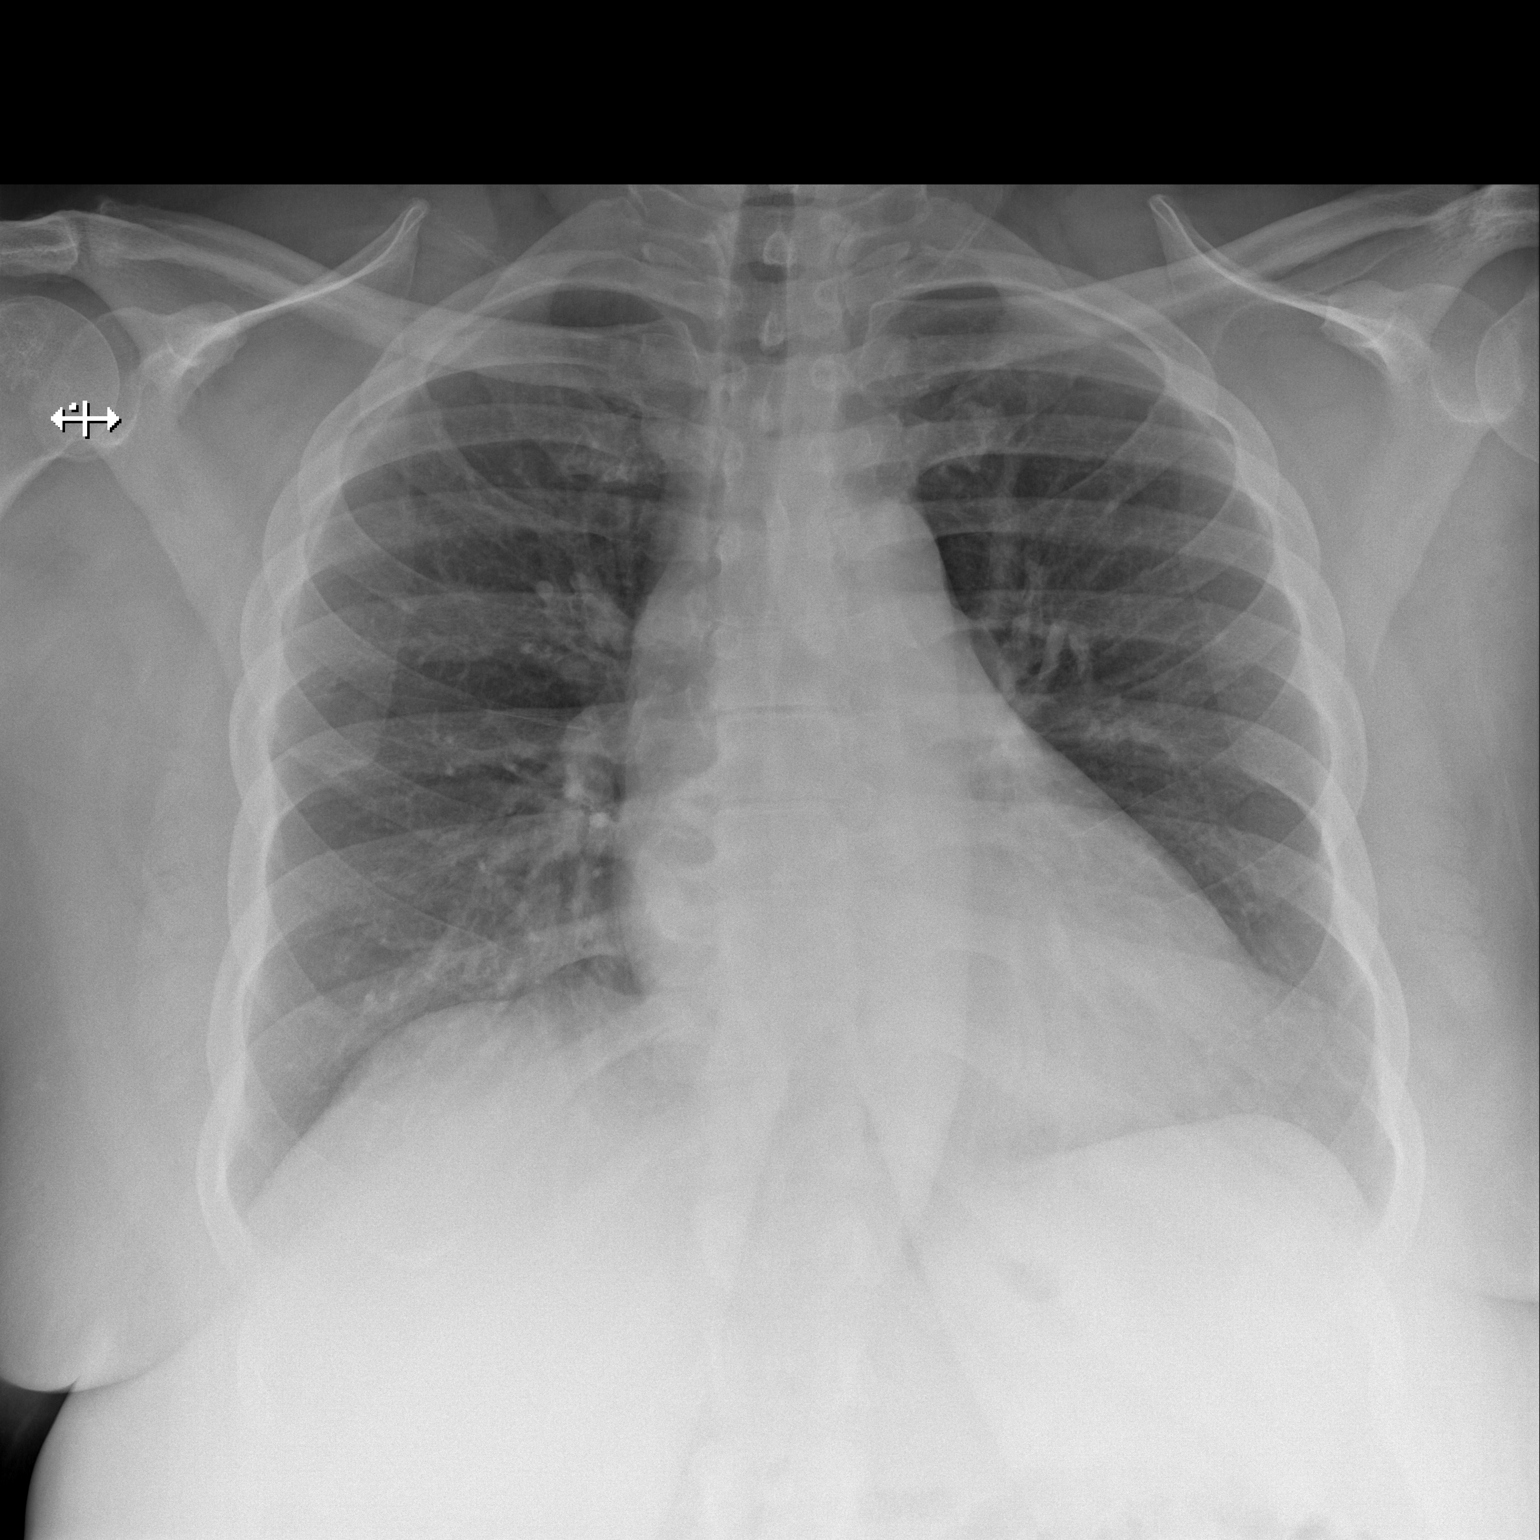

[w chest lat]
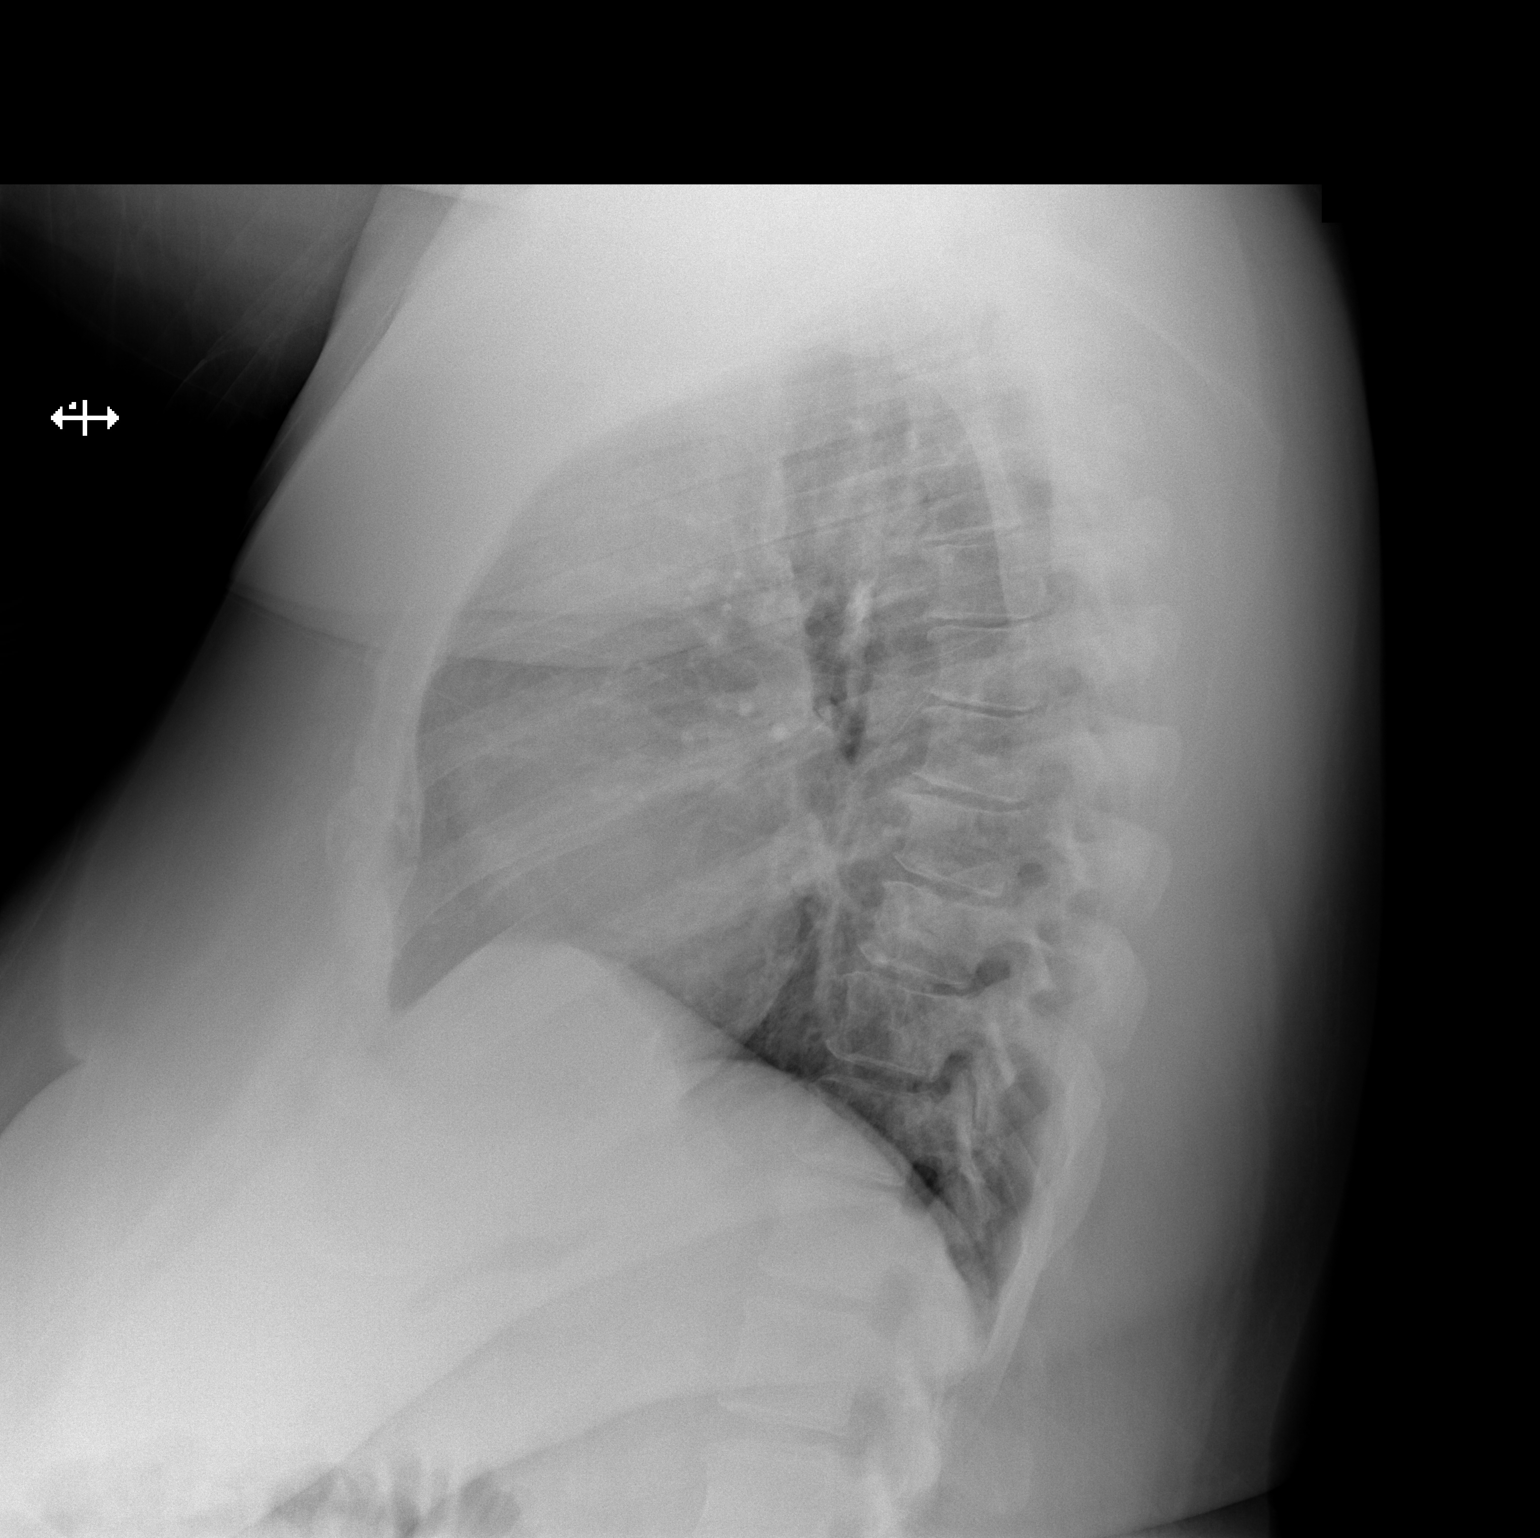

[2 of 2 positions shown; findings below may reference images not displayed]

FINDINGS: Mediastinum and hilar structures are normal. Stable mild
cardiomegaly with normal pulmonary vascularity. No focal infiltrate.
No pleural effusion or pneumothorax. No acute osseous abnormality.
Degenerative changes thoracic spine.
IMPRESSION: 1. Stable cardiomegaly .
2. No acute pulmonary disease.

## 2016-09-24 DIAGNOSIS — R079 Chest pain, unspecified: Secondary | ICD-10-CM | POA: Diagnosis not present

## 2016-09-25 DIAGNOSIS — R079 Chest pain, unspecified: Secondary | ICD-10-CM | POA: Diagnosis not present

## 2016-09-26 DIAGNOSIS — R079 Chest pain, unspecified: Secondary | ICD-10-CM | POA: Diagnosis not present

## 2016-09-27 DIAGNOSIS — R079 Chest pain, unspecified: Secondary | ICD-10-CM | POA: Diagnosis not present

## 2016-10-02 ENCOUNTER — Ambulatory Visit (INDEPENDENT_AMBULATORY_CARE_PROVIDER_SITE_OTHER): Payer: Medicare Other | Admitting: Podiatry

## 2016-10-02 ENCOUNTER — Encounter: Payer: Self-pay | Admitting: Podiatry

## 2016-10-02 DIAGNOSIS — M722 Plantar fascial fibromatosis: Secondary | ICD-10-CM | POA: Diagnosis not present

## 2016-10-02 MED ORDER — OXYCODONE-ACETAMINOPHEN 10-325 MG PO TABS: 1.0000 | ORAL_TABLET | Freq: Two times a day (BID) | ORAL | 0 refills | Status: DC | PRN

## 2016-10-02 NOTE — Progress Notes (Signed)
Subjective: 46 year old female presents complaining of pain in left foot and walking with a cane. Stated that she was taking pain medication three times a day. Patient points the source of pain is at under left heel. Patient request for pain medication.Patient request for pain medication prescribed.  Had left shoulder surgery in July 31, 2016 for torn Rotator cuff muscle. Getting therapy 3 days a week at this time. Otherwise getting around ok. Foot pain is the same.  Hx of a fall on11/10/17 and injured left shoulder and knee.   Objective: New left heel pain. Lingering foot pain since the fall occurred in, 05/07/16. Neurovascular status are within normal.  Assessment: Plantar fasciitis left. History of recent fall 05/07/16.  Plan: Counseling done to decrease pain medication. Same dose prescribed with q 12 hr interval this time. Patient understands this prescription should last a month before any further request.

## 2016-10-02 NOTE — Patient Instructions (Signed)
Seen for pain in left heel. As per request pain medication prescribed. Instructed to limit to one a day, and if really needed may take two a day. Return as needed.

## 2016-10-10 ENCOUNTER — Other Ambulatory Visit: Payer: Self-pay | Admitting: Pulmonary Disease

## 2016-10-22 DIAGNOSIS — I10 Essential (primary) hypertension: Secondary | ICD-10-CM | POA: Diagnosis not present

## 2016-10-22 DIAGNOSIS — E785 Hyperlipidemia, unspecified: Secondary | ICD-10-CM | POA: Diagnosis not present

## 2016-10-22 DIAGNOSIS — Z794 Long term (current) use of insulin: Secondary | ICD-10-CM | POA: Diagnosis not present

## 2016-10-22 DIAGNOSIS — E088 Diabetes mellitus due to underlying condition with unspecified complications: Secondary | ICD-10-CM | POA: Diagnosis not present

## 2016-10-23 ENCOUNTER — Other Ambulatory Visit: Payer: Self-pay | Admitting: *Deleted

## 2016-10-23 ENCOUNTER — Encounter: Payer: Self-pay | Admitting: Neurology

## 2016-10-23 DIAGNOSIS — R200 Anesthesia of skin: Secondary | ICD-10-CM

## 2016-10-24 DIAGNOSIS — M25512 Pain in left shoulder: Secondary | ICD-10-CM | POA: Diagnosis not present

## 2016-10-24 DIAGNOSIS — M7502 Adhesive capsulitis of left shoulder: Secondary | ICD-10-CM | POA: Diagnosis not present

## 2016-10-24 DIAGNOSIS — M25612 Stiffness of left shoulder, not elsewhere classified: Secondary | ICD-10-CM | POA: Diagnosis not present

## 2016-10-24 DIAGNOSIS — M6281 Muscle weakness (generalized): Secondary | ICD-10-CM | POA: Diagnosis not present

## 2016-10-29 DIAGNOSIS — M25512 Pain in left shoulder: Secondary | ICD-10-CM | POA: Diagnosis not present

## 2016-10-29 DIAGNOSIS — M6281 Muscle weakness (generalized): Secondary | ICD-10-CM | POA: Diagnosis not present

## 2016-10-29 DIAGNOSIS — M7502 Adhesive capsulitis of left shoulder: Secondary | ICD-10-CM | POA: Diagnosis not present

## 2016-10-29 DIAGNOSIS — M25612 Stiffness of left shoulder, not elsewhere classified: Secondary | ICD-10-CM | POA: Diagnosis not present

## 2016-10-30 ENCOUNTER — Ambulatory Visit (INDEPENDENT_AMBULATORY_CARE_PROVIDER_SITE_OTHER): Payer: Medicare Other | Admitting: Neurology

## 2016-10-30 DIAGNOSIS — G5622 Lesion of ulnar nerve, left upper limb: Secondary | ICD-10-CM

## 2016-10-30 DIAGNOSIS — R200 Anesthesia of skin: Secondary | ICD-10-CM

## 2016-10-30 NOTE — Procedures (Signed)
[facility]  [place], [place]  [place],  [address] Tel: [phone] Fax:  [phone] Test Date:  10/30/2016  Patient: Diana Henderson DOB: 28-Sep-1970 Physician: Narda Amber, DO  Sex: Female Height: 5\' 7"  Ref Phys: Kathryne Hitch, M.D.  ID#: [id number] Temp: 36.1C Technician:    Patient Complaints: This is a 46 year-old female referred for evaluation of left hand numbness.  NCV & EMG Findings: Extensive electrodiagnostic testing of the left upper extremity shows:  1. Left ulnar sensory response shows reduced amplitude (10.9 V).  Left median, radial, and mixed palmar sensory responses are within normal limits. 2. Left ulnar motor response shows reduced amplitude (6.5 mV) and decreased conduction velocity (A Elbow-B Elbow, 37 m/s).  Left median motor responses within normal limits. 3. Chronic motor axon loss changes are seen affecting the first dorsal interosseous and abductor digit the minimi muscles, without accompanied active denervation.  Impression: 1. Left ulnar neuropathy with slowing across the elbow, moderate in degree electrically. 2. There is no evidence of carpal tunnel syndrome or cervical radiculopathy affecting the left upper extremity.   ___________________________ Narda Amber, DO    Nerve Conduction Studies Anti Sensory Summary Table   Site NR Peak (ms) Norm Peak (ms) P-T Amp (V) Norm P-T Amp  Left Median Anti Sensory (2nd Digit)  36.1C  Wrist    2.7 <3.4 21.2 >20  Left Radial Anti Sensory (Base 1st Digit)  36.1C  Wrist    2.1 <2.7 20.6 >18  Left Ulnar Anti Sensory (5th Digit)  36.1C  Wrist    2.8 <3.1 10.9 >12   Motor Summary Table   Site NR Onset (ms) Norm Onset (ms) O-P Amp (mV) Norm O-P Amp Site1 Site2 Delta-0 (ms) Dist (cm) Vel (m/s) Norm Vel (m/s)  Left Median Motor (Abd Poll Brev)  36.1C  Wrist    2.8 <3.9 8.4 >6 Elbow Wrist 5.7 29.0 51 >50  Elbow    8.5  8.3         Left Ulnar Motor (Abd Dig Minimi)  36.1C    Wrist    2.4 <3.1 6.5 >7 B Elbow Wrist 4.4 25.0 57 >50  B Elbow    6.8  5.8  A Elbow B Elbow 2.7 10.0 37 >50  A Elbow    9.5  5.3          Comparison Summary Table   Site NR Peak (ms) Norm Peak (ms) P-T Amp (V) Site1 Site2 Delta-P (ms) Norm Delta (ms)  Left Median/Ulnar Palm Comparison (Wrist - 8cm)  36.1C  Median Palm    1.4 <2.2 28.4 Median Palm Ulnar Palm 0.0   Ulnar Palm    1.4 <2.2 8.4       EMG   Side Muscle Ins Act Fibs Psw Fasc Number Recrt Dur Dur. Amp Amp. Poly Poly. Comment  Left 1stDorInt Nml Nml Nml Nml 1- Mod-R Some 1+ Few 1+ Nml Nml N/A  Left Ext Indicis Nml Nml Nml Nml Nml Nml Nml Nml Nml Nml Nml Nml N/A  Left PronatorTeres Nml Nml Nml Nml Nml Nml Nml Nml Nml Nml Nml Nml N/A  Left Biceps Nml Nml Nml Nml Nml Nml Nml Nml Nml Nml Nml Nml N/A  Left Triceps Nml Nml Nml Nml Nml Nml Nml Nml Nml Nml Nml Nml N/A  Left Deltoid Nml Nml Nml Nml Nml Nml Nml Nml Nml Nml Nml Nml N/A  Left ABD Dig Min Nml Nml Nml Nml 1- Rapid Some 1+ Some  1+ Nml Nml N/A  Left FlexCarpiUln Nml Nml Nml Nml Nml Nml Nml Nml Nml Nml Nml Nml N/A      Waveforms:

## 2016-10-31 DIAGNOSIS — M25612 Stiffness of left shoulder, not elsewhere classified: Secondary | ICD-10-CM | POA: Diagnosis not present

## 2016-10-31 DIAGNOSIS — M6281 Muscle weakness (generalized): Secondary | ICD-10-CM | POA: Diagnosis not present

## 2016-10-31 DIAGNOSIS — M25512 Pain in left shoulder: Secondary | ICD-10-CM | POA: Diagnosis not present

## 2016-10-31 DIAGNOSIS — M7502 Adhesive capsulitis of left shoulder: Secondary | ICD-10-CM | POA: Diagnosis not present

## 2016-11-05 DIAGNOSIS — M6281 Muscle weakness (generalized): Secondary | ICD-10-CM | POA: Diagnosis not present

## 2016-11-05 DIAGNOSIS — M25512 Pain in left shoulder: Secondary | ICD-10-CM | POA: Diagnosis not present

## 2016-11-05 DIAGNOSIS — M7502 Adhesive capsulitis of left shoulder: Secondary | ICD-10-CM | POA: Diagnosis not present

## 2016-11-05 DIAGNOSIS — M25612 Stiffness of left shoulder, not elsewhere classified: Secondary | ICD-10-CM | POA: Diagnosis not present

## 2016-11-06 ENCOUNTER — Ambulatory Visit (INDEPENDENT_AMBULATORY_CARE_PROVIDER_SITE_OTHER): Payer: Medicare Other | Admitting: Podiatry

## 2016-11-06 ENCOUNTER — Encounter: Payer: Self-pay | Admitting: Podiatry

## 2016-11-06 DIAGNOSIS — M79671 Pain in right foot: Secondary | ICD-10-CM | POA: Diagnosis not present

## 2016-11-06 DIAGNOSIS — B351 Tinea unguium: Secondary | ICD-10-CM | POA: Diagnosis not present

## 2016-11-06 DIAGNOSIS — M659 Synovitis and tenosynovitis, unspecified: Secondary | ICD-10-CM | POA: Diagnosis not present

## 2016-11-06 DIAGNOSIS — M79672 Pain in left foot: Secondary | ICD-10-CM | POA: Diagnosis not present

## 2016-11-06 DIAGNOSIS — M722 Plantar fascial fibromatosis: Secondary | ICD-10-CM | POA: Diagnosis not present

## 2016-11-06 MED ORDER — OXYCODONE-ACETAMINOPHEN 10-325 MG PO TABS: 1.0000 | ORAL_TABLET | Freq: Two times a day (BID) | ORAL | 0 refills | Status: DC | PRN

## 2016-11-06 NOTE — Progress Notes (Signed)
Subjective: 46 year old female presents complaining of pain in both feet. Patient uses a cane and wears regular tennis shoes. Stated that both feet throbs on top. Right heel is very painful to bear weight. Patient requesting her nails trimmed and request for pain medication. She is still in Rehab following her left shoulder surgery.   HPI: Had left shoulder surgery in July 31, 2016 for torn Rotator cuff muscle. Getting therapy 3 days a week at this time. Otherwise getting around ok.  Foot pain is the same. Hx of a fall on11/10/17 and injured left shoulder and knee.   Objective: New heel pain right with ambulation. Pain with ambulation dorsum of both feet. Neurovascular status are within normal. No acute edema or erythema noted.  Assessment: Plantar fasciitis right. History of recent fall 05/07/16. Arthropathy both feet. Pain with ambulation both feet. Onychomycosis x 10.  Plan: Reviewed findings and available treatment options. All nails debrided. Pain medication prescribed as per request. Return as needed.

## 2016-11-06 NOTE — Patient Instructions (Addendum)
Seen for pain on top of both feet, right heel and hypertrophic nails. Reviewed findings, arthritic foot pain, compensatory right heel pain. All nails debrided. May take Ibuprofen for pain. Oxycodone prescribed as per request. + Return in 3 months or as needed.

## 2016-11-07 DIAGNOSIS — M7502 Adhesive capsulitis of left shoulder: Secondary | ICD-10-CM | POA: Diagnosis not present

## 2016-11-07 DIAGNOSIS — M25612 Stiffness of left shoulder, not elsewhere classified: Secondary | ICD-10-CM | POA: Diagnosis not present

## 2016-11-07 DIAGNOSIS — M25512 Pain in left shoulder: Secondary | ICD-10-CM | POA: Diagnosis not present

## 2016-11-07 DIAGNOSIS — M6281 Muscle weakness (generalized): Secondary | ICD-10-CM | POA: Diagnosis not present

## 2016-11-09 DIAGNOSIS — M24112 Other articular cartilage disorders, left shoulder: Secondary | ICD-10-CM | POA: Diagnosis not present

## 2016-11-14 DIAGNOSIS — M6281 Muscle weakness (generalized): Secondary | ICD-10-CM | POA: Diagnosis not present

## 2016-11-14 DIAGNOSIS — M7502 Adhesive capsulitis of left shoulder: Secondary | ICD-10-CM | POA: Diagnosis not present

## 2016-11-14 DIAGNOSIS — M25612 Stiffness of left shoulder, not elsewhere classified: Secondary | ICD-10-CM | POA: Diagnosis not present

## 2016-11-14 DIAGNOSIS — M25512 Pain in left shoulder: Secondary | ICD-10-CM | POA: Diagnosis not present

## 2016-11-21 DIAGNOSIS — M25612 Stiffness of left shoulder, not elsewhere classified: Secondary | ICD-10-CM | POA: Diagnosis not present

## 2016-11-21 DIAGNOSIS — M25512 Pain in left shoulder: Secondary | ICD-10-CM | POA: Diagnosis not present

## 2016-11-21 DIAGNOSIS — M6281 Muscle weakness (generalized): Secondary | ICD-10-CM | POA: Diagnosis not present

## 2016-11-21 DIAGNOSIS — M7502 Adhesive capsulitis of left shoulder: Secondary | ICD-10-CM | POA: Diagnosis not present

## 2016-11-26 DIAGNOSIS — M25612 Stiffness of left shoulder, not elsewhere classified: Secondary | ICD-10-CM | POA: Diagnosis not present

## 2016-11-26 DIAGNOSIS — M6281 Muscle weakness (generalized): Secondary | ICD-10-CM | POA: Diagnosis not present

## 2016-11-26 DIAGNOSIS — M25512 Pain in left shoulder: Secondary | ICD-10-CM | POA: Diagnosis not present

## 2016-11-26 DIAGNOSIS — M7502 Adhesive capsulitis of left shoulder: Secondary | ICD-10-CM | POA: Diagnosis not present

## 2016-11-28 DIAGNOSIS — M6281 Muscle weakness (generalized): Secondary | ICD-10-CM | POA: Diagnosis not present

## 2016-11-28 DIAGNOSIS — M25612 Stiffness of left shoulder, not elsewhere classified: Secondary | ICD-10-CM | POA: Diagnosis not present

## 2016-11-28 DIAGNOSIS — M25512 Pain in left shoulder: Secondary | ICD-10-CM | POA: Diagnosis not present

## 2016-11-28 DIAGNOSIS — M7502 Adhesive capsulitis of left shoulder: Secondary | ICD-10-CM | POA: Diagnosis not present

## 2016-12-02 DIAGNOSIS — J45901 Unspecified asthma with (acute) exacerbation: Secondary | ICD-10-CM | POA: Diagnosis not present

## 2016-12-02 DIAGNOSIS — R0600 Dyspnea, unspecified: Secondary | ICD-10-CM | POA: Diagnosis not present

## 2016-12-02 DIAGNOSIS — R0602 Shortness of breath: Secondary | ICD-10-CM | POA: Diagnosis not present

## 2016-12-02 DIAGNOSIS — J4552 Severe persistent asthma with status asthmaticus: Secondary | ICD-10-CM | POA: Diagnosis not present

## 2016-12-03 DIAGNOSIS — J4552 Severe persistent asthma with status asthmaticus: Secondary | ICD-10-CM | POA: Diagnosis not present

## 2016-12-03 DIAGNOSIS — E118 Type 2 diabetes mellitus with unspecified complications: Secondary | ICD-10-CM | POA: Diagnosis not present

## 2016-12-03 DIAGNOSIS — J9601 Acute respiratory failure with hypoxia: Secondary | ICD-10-CM | POA: Diagnosis not present

## 2016-12-03 DIAGNOSIS — R0602 Shortness of breath: Secondary | ICD-10-CM | POA: Diagnosis not present

## 2016-12-03 DIAGNOSIS — I10 Essential (primary) hypertension: Secondary | ICD-10-CM | POA: Diagnosis not present

## 2016-12-03 DIAGNOSIS — J45901 Unspecified asthma with (acute) exacerbation: Secondary | ICD-10-CM | POA: Diagnosis not present

## 2016-12-04 DIAGNOSIS — E1110 Type 2 diabetes mellitus with ketoacidosis without coma: Secondary | ICD-10-CM | POA: Diagnosis not present

## 2016-12-04 DIAGNOSIS — I517 Cardiomegaly: Secondary | ICD-10-CM | POA: Diagnosis not present

## 2016-12-04 DIAGNOSIS — E118 Type 2 diabetes mellitus with unspecified complications: Secondary | ICD-10-CM | POA: Diagnosis not present

## 2016-12-04 DIAGNOSIS — I10 Essential (primary) hypertension: Secondary | ICD-10-CM | POA: Diagnosis not present

## 2016-12-04 DIAGNOSIS — J9601 Acute respiratory failure with hypoxia: Secondary | ICD-10-CM | POA: Diagnosis not present

## 2016-12-04 DIAGNOSIS — E872 Acidosis: Secondary | ICD-10-CM | POA: Diagnosis not present

## 2016-12-04 DIAGNOSIS — J4552 Severe persistent asthma with status asthmaticus: Secondary | ICD-10-CM | POA: Diagnosis not present

## 2016-12-04 DIAGNOSIS — Z794 Long term (current) use of insulin: Secondary | ICD-10-CM | POA: Diagnosis not present

## 2016-12-04 DIAGNOSIS — E1165 Type 2 diabetes mellitus with hyperglycemia: Secondary | ICD-10-CM | POA: Diagnosis not present

## 2016-12-04 DIAGNOSIS — R0602 Shortness of breath: Secondary | ICD-10-CM | POA: Diagnosis not present

## 2016-12-04 DIAGNOSIS — Z79899 Other long term (current) drug therapy: Secondary | ICD-10-CM | POA: Diagnosis not present

## 2016-12-04 DIAGNOSIS — J45901 Unspecified asthma with (acute) exacerbation: Secondary | ICD-10-CM | POA: Diagnosis not present

## 2016-12-04 DIAGNOSIS — Z452 Encounter for adjustment and management of vascular access device: Secondary | ICD-10-CM | POA: Diagnosis not present

## 2016-12-04 DIAGNOSIS — J4520 Mild intermittent asthma, uncomplicated: Secondary | ICD-10-CM | POA: Diagnosis not present

## 2016-12-04 DIAGNOSIS — G4733 Obstructive sleep apnea (adult) (pediatric): Secondary | ICD-10-CM | POA: Diagnosis not present

## 2016-12-04 DIAGNOSIS — J9600 Acute respiratory failure, unspecified whether with hypoxia or hypercapnia: Secondary | ICD-10-CM | POA: Diagnosis not present

## 2016-12-04 DIAGNOSIS — Z87891 Personal history of nicotine dependence: Secondary | ICD-10-CM | POA: Diagnosis not present

## 2016-12-04 DIAGNOSIS — Z7982 Long term (current) use of aspirin: Secondary | ICD-10-CM | POA: Diagnosis not present

## 2016-12-05 DIAGNOSIS — E118 Type 2 diabetes mellitus with unspecified complications: Secondary | ICD-10-CM | POA: Diagnosis not present

## 2016-12-05 DIAGNOSIS — J45901 Unspecified asthma with (acute) exacerbation: Secondary | ICD-10-CM | POA: Diagnosis not present

## 2016-12-05 DIAGNOSIS — I10 Essential (primary) hypertension: Secondary | ICD-10-CM | POA: Diagnosis not present

## 2016-12-05 DIAGNOSIS — J9601 Acute respiratory failure with hypoxia: Secondary | ICD-10-CM | POA: Diagnosis not present

## 2016-12-12 DIAGNOSIS — J9601 Acute respiratory failure with hypoxia: Secondary | ICD-10-CM | POA: Diagnosis not present

## 2016-12-12 DIAGNOSIS — E118 Type 2 diabetes mellitus with unspecified complications: Secondary | ICD-10-CM | POA: Diagnosis not present

## 2016-12-12 DIAGNOSIS — J45901 Unspecified asthma with (acute) exacerbation: Secondary | ICD-10-CM | POA: Diagnosis not present

## 2016-12-12 DIAGNOSIS — I10 Essential (primary) hypertension: Secondary | ICD-10-CM | POA: Diagnosis not present

## 2016-12-13 DIAGNOSIS — E118 Type 2 diabetes mellitus with unspecified complications: Secondary | ICD-10-CM | POA: Diagnosis not present

## 2016-12-13 DIAGNOSIS — J45901 Unspecified asthma with (acute) exacerbation: Secondary | ICD-10-CM | POA: Diagnosis not present

## 2016-12-13 DIAGNOSIS — G4733 Obstructive sleep apnea (adult) (pediatric): Secondary | ICD-10-CM | POA: Diagnosis not present

## 2016-12-13 DIAGNOSIS — J9601 Acute respiratory failure with hypoxia: Secondary | ICD-10-CM | POA: Diagnosis not present

## 2016-12-18 ENCOUNTER — Ambulatory Visit: Payer: Medicare Other | Admitting: Podiatry

## 2016-12-18 ENCOUNTER — Ambulatory Visit (INDEPENDENT_AMBULATORY_CARE_PROVIDER_SITE_OTHER): Payer: Medicare Other | Admitting: Podiatry

## 2016-12-18 DIAGNOSIS — M79671 Pain in right foot: Secondary | ICD-10-CM

## 2016-12-18 DIAGNOSIS — J45901 Unspecified asthma with (acute) exacerbation: Secondary | ICD-10-CM | POA: Diagnosis not present

## 2016-12-18 DIAGNOSIS — S90921D Unspecified superficial injury of right foot, subsequent encounter: Secondary | ICD-10-CM

## 2016-12-18 DIAGNOSIS — M659 Synovitis and tenosynovitis, unspecified: Secondary | ICD-10-CM | POA: Diagnosis not present

## 2016-12-18 DIAGNOSIS — M79672 Pain in left foot: Secondary | ICD-10-CM

## 2016-12-18 MED ORDER — OXYCODONE-ACETAMINOPHEN 10-325 MG PO TABS: 1.0000 | ORAL_TABLET | Freq: Two times a day (BID) | ORAL | 0 refills | Status: DC | PRN

## 2016-12-18 NOTE — Progress Notes (Signed)
Subjective: 46 year old female presents complaining of pain in both feet.  Stated that since the injury her feet are still acting up periodically.  Patient uses a cane and wears regular tennis shoes. Stated that both feet throbs on top. Right heel is very painful to bear weight. Patient requesting her nails trimmed and request for pain medication.  HPI: Had left shoulder surgery in July 31, 2016 for torn Rotator cuff muscle. Getting therapy 3 days a week at this time. Otherwise getting around ok.  Foot pain is the same. Hx of a fall on11/10/17 and injured left shoulder and knee.   Objective: Pain with ambulation dorsum of both feet. Neurovascular status are within normal. No acute edema or erythema noted.  Assessment: Plantar fasciitis right. History of fall 05/07/16. Arthropathy both feet. Pain with ambulation both feet. Onychomycosis x 10.  Plan: Reviewed findings and available treatment options. Pain medication prescribed as per request. Return as needed.

## 2016-12-18 NOTE — Patient Instructions (Signed)
Seen for painful swollen foot and ankle. Medication prescribed as requested. Return as needed.

## 2016-12-20 ENCOUNTER — Encounter: Payer: Self-pay | Admitting: Podiatry

## 2016-12-25 DIAGNOSIS — G4733 Obstructive sleep apnea (adult) (pediatric): Secondary | ICD-10-CM | POA: Diagnosis not present

## 2016-12-25 DIAGNOSIS — J4541 Moderate persistent asthma with (acute) exacerbation: Secondary | ICD-10-CM | POA: Diagnosis not present

## 2016-12-25 DIAGNOSIS — J301 Allergic rhinitis due to pollen: Secondary | ICD-10-CM | POA: Diagnosis not present

## 2016-12-26 DIAGNOSIS — I10 Essential (primary) hypertension: Secondary | ICD-10-CM | POA: Diagnosis not present

## 2016-12-26 DIAGNOSIS — E119 Type 2 diabetes mellitus without complications: Secondary | ICD-10-CM | POA: Diagnosis not present

## 2016-12-26 DIAGNOSIS — M24112 Other articular cartilage disorders, left shoulder: Secondary | ICD-10-CM | POA: Diagnosis not present

## 2016-12-26 DIAGNOSIS — J45909 Unspecified asthma, uncomplicated: Secondary | ICD-10-CM | POA: Diagnosis not present

## 2016-12-26 DIAGNOSIS — I119 Hypertensive heart disease without heart failure: Secondary | ICD-10-CM | POA: Diagnosis not present

## 2016-12-26 DIAGNOSIS — G629 Polyneuropathy, unspecified: Secondary | ICD-10-CM | POA: Diagnosis not present

## 2016-12-31 DIAGNOSIS — M7502 Adhesive capsulitis of left shoulder: Secondary | ICD-10-CM | POA: Diagnosis not present

## 2016-12-31 DIAGNOSIS — M25512 Pain in left shoulder: Secondary | ICD-10-CM | POA: Diagnosis not present

## 2016-12-31 DIAGNOSIS — M25612 Stiffness of left shoulder, not elsewhere classified: Secondary | ICD-10-CM | POA: Diagnosis not present

## 2016-12-31 DIAGNOSIS — M6281 Muscle weakness (generalized): Secondary | ICD-10-CM | POA: Diagnosis not present

## 2017-01-02 DIAGNOSIS — M7502 Adhesive capsulitis of left shoulder: Secondary | ICD-10-CM | POA: Diagnosis not present

## 2017-01-02 DIAGNOSIS — M6281 Muscle weakness (generalized): Secondary | ICD-10-CM | POA: Diagnosis not present

## 2017-01-02 DIAGNOSIS — M25612 Stiffness of left shoulder, not elsewhere classified: Secondary | ICD-10-CM | POA: Diagnosis not present

## 2017-01-02 DIAGNOSIS — M25512 Pain in left shoulder: Secondary | ICD-10-CM | POA: Diagnosis not present

## 2017-01-05 DIAGNOSIS — E860 Dehydration: Secondary | ICD-10-CM | POA: Diagnosis not present

## 2017-01-05 DIAGNOSIS — R0603 Acute respiratory distress: Secondary | ICD-10-CM | POA: Diagnosis not present

## 2017-01-05 DIAGNOSIS — R739 Hyperglycemia, unspecified: Secondary | ICD-10-CM | POA: Diagnosis not present

## 2017-01-05 DIAGNOSIS — I10 Essential (primary) hypertension: Secondary | ICD-10-CM | POA: Diagnosis not present

## 2017-01-05 DIAGNOSIS — E119 Type 2 diabetes mellitus without complications: Secondary | ICD-10-CM | POA: Diagnosis not present

## 2017-01-05 DIAGNOSIS — Z794 Long term (current) use of insulin: Secondary | ICD-10-CM | POA: Diagnosis not present

## 2017-01-05 DIAGNOSIS — R531 Weakness: Secondary | ICD-10-CM | POA: Diagnosis not present

## 2017-01-05 DIAGNOSIS — E1165 Type 2 diabetes mellitus with hyperglycemia: Secondary | ICD-10-CM | POA: Diagnosis not present

## 2017-01-14 DIAGNOSIS — M25612 Stiffness of left shoulder, not elsewhere classified: Secondary | ICD-10-CM | POA: Diagnosis not present

## 2017-01-14 DIAGNOSIS — M6281 Muscle weakness (generalized): Secondary | ICD-10-CM | POA: Diagnosis not present

## 2017-01-14 DIAGNOSIS — M7502 Adhesive capsulitis of left shoulder: Secondary | ICD-10-CM | POA: Diagnosis not present

## 2017-01-14 DIAGNOSIS — M25512 Pain in left shoulder: Secondary | ICD-10-CM | POA: Diagnosis not present

## 2017-01-15 ENCOUNTER — Ambulatory Visit (INDEPENDENT_AMBULATORY_CARE_PROVIDER_SITE_OTHER): Payer: Medicare Other | Admitting: Podiatry

## 2017-01-15 ENCOUNTER — Encounter: Payer: Self-pay | Admitting: Podiatry

## 2017-01-15 DIAGNOSIS — M659 Synovitis and tenosynovitis, unspecified: Secondary | ICD-10-CM

## 2017-01-15 DIAGNOSIS — S99921A Unspecified injury of right foot, initial encounter: Secondary | ICD-10-CM | POA: Diagnosis not present

## 2017-01-15 DIAGNOSIS — R262 Difficulty in walking, not elsewhere classified: Secondary | ICD-10-CM

## 2017-01-15 MED ORDER — OXYCODONE-ACETAMINOPHEN 10-325 MG PO TABS: 1.0000 | ORAL_TABLET | Freq: Two times a day (BID) | ORAL | 0 refills | Status: DC | PRN

## 2017-01-15 NOTE — Patient Instructions (Signed)
Seen for pain in right foot. Had a fall last night and hurts to walk. No acute soft tissue injury noted. May use ankle brace as needed. Pain medication prescribed as per request.

## 2017-01-15 NOTE — Progress Notes (Signed)
Subjective: 46year old female presents complaining of pain on right foot after falling in her steps last night. Fell off from 6 steps and tangled down on a rug. Right foot got caught on the rug. She felt something popped on top of the right midfoot area. Also hurts on lateral ankle. Hurts top of right foot to walk on. Also having a painful knot at the medial aspect of the first metatarsal head.  Able to walk with cane.  HPI: Had left shoulder surgery in July 31, 2016 for torn Rotator cuff muscle. Getting therapy 3 days a week at this time. Otherwise getting around ok.  Foot pain is the same. Hx of a fall on11/10/17 and injured left shoulder and knee.   Objective: Injury right foot with palpable knot at medial aspect of the first metatarsal shaft. Pain with ambulation dorsum of both feet. Neurovascular status are within normal. No acute edema or erythema noted at the affected area of right foot.  Assessment: [place] from 6 steps and tangled down on rug right foot 01/15/17. History of fall 05/07/16. Arthropathy both feet. Pain with ambulation both feet. Onychomycosis x 10.  Plan: Reviewed findings and available treatment options. Pain medication prescribed as per request. Patient is to use ankle brace when she gets home. Return as needed.

## 2017-01-16 DIAGNOSIS — M7502 Adhesive capsulitis of left shoulder: Secondary | ICD-10-CM | POA: Diagnosis not present

## 2017-01-16 DIAGNOSIS — M25612 Stiffness of left shoulder, not elsewhere classified: Secondary | ICD-10-CM | POA: Diagnosis not present

## 2017-01-16 DIAGNOSIS — M25512 Pain in left shoulder: Secondary | ICD-10-CM | POA: Diagnosis not present

## 2017-01-16 DIAGNOSIS — M6281 Muscle weakness (generalized): Secondary | ICD-10-CM | POA: Diagnosis not present

## 2017-01-17 DIAGNOSIS — H5203 Hypermetropia, bilateral: Secondary | ICD-10-CM | POA: Diagnosis not present

## 2017-01-17 DIAGNOSIS — E113293 Type 2 diabetes mellitus with mild nonproliferative diabetic retinopathy without macular edema, bilateral: Secondary | ICD-10-CM | POA: Diagnosis not present

## 2017-01-17 DIAGNOSIS — H524 Presbyopia: Secondary | ICD-10-CM | POA: Diagnosis not present

## 2017-01-18 DIAGNOSIS — J45901 Unspecified asthma with (acute) exacerbation: Secondary | ICD-10-CM | POA: Diagnosis not present

## 2017-01-21 DIAGNOSIS — M7502 Adhesive capsulitis of left shoulder: Secondary | ICD-10-CM | POA: Diagnosis not present

## 2017-01-21 DIAGNOSIS — M6281 Muscle weakness (generalized): Secondary | ICD-10-CM | POA: Diagnosis not present

## 2017-01-21 DIAGNOSIS — M25512 Pain in left shoulder: Secondary | ICD-10-CM | POA: Diagnosis not present

## 2017-01-21 DIAGNOSIS — M25612 Stiffness of left shoulder, not elsewhere classified: Secondary | ICD-10-CM | POA: Diagnosis not present

## 2017-01-22 DIAGNOSIS — G4733 Obstructive sleep apnea (adult) (pediatric): Secondary | ICD-10-CM | POA: Diagnosis not present

## 2017-01-23 DIAGNOSIS — M6281 Muscle weakness (generalized): Secondary | ICD-10-CM | POA: Diagnosis not present

## 2017-01-23 DIAGNOSIS — M7502 Adhesive capsulitis of left shoulder: Secondary | ICD-10-CM | POA: Diagnosis not present

## 2017-01-23 DIAGNOSIS — M25612 Stiffness of left shoulder, not elsewhere classified: Secondary | ICD-10-CM | POA: Diagnosis not present

## 2017-01-23 DIAGNOSIS — M25512 Pain in left shoulder: Secondary | ICD-10-CM | POA: Diagnosis not present

## 2017-01-24 DIAGNOSIS — E119 Type 2 diabetes mellitus without complications: Secondary | ICD-10-CM | POA: Diagnosis not present

## 2017-01-24 DIAGNOSIS — I10 Essential (primary) hypertension: Secondary | ICD-10-CM | POA: Diagnosis not present

## 2017-01-24 DIAGNOSIS — I119 Hypertensive heart disease without heart failure: Secondary | ICD-10-CM | POA: Diagnosis not present

## 2017-01-24 DIAGNOSIS — Z0000 Encounter for general adult medical examination without abnormal findings: Secondary | ICD-10-CM | POA: Diagnosis not present

## 2017-01-24 DIAGNOSIS — J45909 Unspecified asthma, uncomplicated: Secondary | ICD-10-CM | POA: Diagnosis not present

## 2017-01-28 DIAGNOSIS — M25512 Pain in left shoulder: Secondary | ICD-10-CM | POA: Diagnosis not present

## 2017-01-28 DIAGNOSIS — M7502 Adhesive capsulitis of left shoulder: Secondary | ICD-10-CM | POA: Diagnosis not present

## 2017-01-28 DIAGNOSIS — M25612 Stiffness of left shoulder, not elsewhere classified: Secondary | ICD-10-CM | POA: Diagnosis not present

## 2017-01-28 DIAGNOSIS — M6281 Muscle weakness (generalized): Secondary | ICD-10-CM | POA: Diagnosis not present

## 2017-02-05 ENCOUNTER — Other Ambulatory Visit: Payer: Self-pay | Admitting: Pulmonary Disease

## 2017-02-14 ENCOUNTER — Ambulatory Visit (INDEPENDENT_AMBULATORY_CARE_PROVIDER_SITE_OTHER): Payer: Medicare Other | Admitting: Podiatry

## 2017-02-14 ENCOUNTER — Encounter: Payer: Self-pay | Admitting: Podiatry

## 2017-02-14 DIAGNOSIS — R262 Difficulty in walking, not elsewhere classified: Secondary | ICD-10-CM

## 2017-02-14 DIAGNOSIS — B351 Tinea unguium: Secondary | ICD-10-CM | POA: Diagnosis not present

## 2017-02-14 DIAGNOSIS — M65979 Unspecified synovitis and tenosynovitis, unspecified ankle and foot: Secondary | ICD-10-CM

## 2017-02-14 DIAGNOSIS — M659 Synovitis and tenosynovitis, unspecified: Secondary | ICD-10-CM | POA: Diagnosis not present

## 2017-02-14 MED ORDER — OXYCODONE-ACETAMINOPHEN 10-325 MG PO TABS: 1.0000 | ORAL_TABLET | Freq: Two times a day (BID) | ORAL | 0 refills | Status: DC | PRN

## 2017-02-14 NOTE — Progress Notes (Signed)
Subjective: 46year old female presents complaining of pain on right foot. Patient request toe nails trimmed and request for pain medication. She has gone back to school and had to walk long way to class rooms.   HPI: Had left shoulder surgery in July 31, 2016 for torn Rotator cuff muscle. Getting therapy 3 days a week at this time. Otherwise getting around ok.  Foot pain is the same. Hx of a fall on11/10/17 and injured left shoulder and knee.   Objective: Pain with prolonged ambulation both feet. Neurovascular status are within normal. No acute edema or erythema noted at the affected area of right foot. Thick dystrophic nails x 10.  Assessment: Residual foot pain from previous injury, 01/15/17 and 05/07/16. Arthropathy both feet. Pain with ambulation both feet. Onychomycosis x 10.  Plan: Reviewed findings and available treatment options. Pain medication prescribed as per request. Patient is to use ankle brace as needed for ambulation. Return as needed.

## 2017-02-14 NOTE — Patient Instructions (Signed)
Seen for pain in right foot and hypertrophic nails. All nails debrided. Pain medication prescribed. Return in 3 months or as needed.

## 2017-02-18 DIAGNOSIS — J45901 Unspecified asthma with (acute) exacerbation: Secondary | ICD-10-CM | POA: Diagnosis not present

## 2017-02-25 DIAGNOSIS — J4530 Mild persistent asthma, uncomplicated: Secondary | ICD-10-CM | POA: Diagnosis not present

## 2017-02-25 DIAGNOSIS — R5383 Other fatigue: Secondary | ICD-10-CM | POA: Diagnosis not present

## 2017-02-25 DIAGNOSIS — G4733 Obstructive sleep apnea (adult) (pediatric): Secondary | ICD-10-CM | POA: Diagnosis not present

## 2017-03-06 ENCOUNTER — Other Ambulatory Visit: Payer: Self-pay | Admitting: Pulmonary Disease

## 2017-03-06 DIAGNOSIS — R0602 Shortness of breath: Secondary | ICD-10-CM | POA: Diagnosis not present

## 2017-03-06 DIAGNOSIS — R0789 Other chest pain: Secondary | ICD-10-CM | POA: Diagnosis not present

## 2017-03-06 DIAGNOSIS — R079 Chest pain, unspecified: Secondary | ICD-10-CM | POA: Diagnosis not present

## 2017-03-06 DIAGNOSIS — Z87891 Personal history of nicotine dependence: Secondary | ICD-10-CM | POA: Diagnosis not present

## 2017-03-12 ENCOUNTER — Ambulatory Visit (INDEPENDENT_AMBULATORY_CARE_PROVIDER_SITE_OTHER): Payer: Medicare Other | Admitting: Podiatry

## 2017-03-12 ENCOUNTER — Encounter: Payer: Self-pay | Admitting: Podiatry

## 2017-03-12 DIAGNOSIS — M79672 Pain in left foot: Secondary | ICD-10-CM

## 2017-03-12 DIAGNOSIS — M659 Synovitis and tenosynovitis, unspecified: Secondary | ICD-10-CM | POA: Diagnosis not present

## 2017-03-12 DIAGNOSIS — M79671 Pain in right foot: Secondary | ICD-10-CM | POA: Diagnosis not present

## 2017-03-12 MED ORDER — OXYCODONE-ACETAMINOPHEN 10-325 MG PO TABS: 1.0000 | ORAL_TABLET | Freq: Two times a day (BID) | ORAL | 0 refills | Status: DC | PRN

## 2017-03-12 NOTE — Patient Instructions (Signed)
Seen for painful feet. Continue with compression socks. Pain medication prescribed.

## 2017-03-12 NOTE — Progress Notes (Signed)
Subjective: 46year old female presents complaining of pain on right foot. She is wearing compression socks. She has gone back to school and had to walk long way to class rooms.  Patient request for pain medication.  HPI: Had left shoulder surgery in July 31, 2016 for torn Rotator cuff muscle. Getting therapy 3 days a week at this time. Otherwise getting around ok.  Foot pain is the same. Hx of a fall on11/10/17 and injured left shoulder and knee.   Objective: Pain with prolonged ambulation both feet. Neurovascular status are within normal. No acute edema or erythema noted at the affected area of right foot. Thick dystrophic nails x 10.  Assessment: Residual foot pain from previous injury, 01/15/17 and 05/07/16. Arthropathy both feet. Pain with ambulation both feet. Onychomycosis x 10.  Plan: Reviewed findings and available treatment options. Pain medication prescribed as per request. Return as needed.

## 2017-03-20 DIAGNOSIS — J45901 Unspecified asthma with (acute) exacerbation: Secondary | ICD-10-CM | POA: Diagnosis not present

## 2017-04-01 DIAGNOSIS — J4540 Moderate persistent asthma, uncomplicated: Secondary | ICD-10-CM | POA: Diagnosis not present

## 2017-04-01 DIAGNOSIS — G4733 Obstructive sleep apnea (adult) (pediatric): Secondary | ICD-10-CM | POA: Diagnosis not present

## 2017-04-10 ENCOUNTER — Ambulatory Visit (INDEPENDENT_AMBULATORY_CARE_PROVIDER_SITE_OTHER): Payer: Medicare Other | Admitting: Podiatry

## 2017-04-10 ENCOUNTER — Encounter: Payer: Self-pay | Admitting: Podiatry

## 2017-04-10 DIAGNOSIS — R600 Localized edema: Secondary | ICD-10-CM

## 2017-04-10 DIAGNOSIS — M659 Synovitis and tenosynovitis, unspecified: Secondary | ICD-10-CM

## 2017-04-10 DIAGNOSIS — R262 Difficulty in walking, not elsewhere classified: Secondary | ICD-10-CM

## 2017-04-10 MED ORDER — OXYCODONE-ACETAMINOPHEN 10-325 MG PO TABS: 1.0000 | ORAL_TABLET | Freq: Two times a day (BID) | ORAL | 0 refills | Status: DC | PRN

## 2017-04-10 NOTE — Patient Instructions (Addendum)
Pain in right foot since the injury and requiring increased pain medication. Will try Diabetic shoes rather than increasing pain medication dose. Continue with compression socks for swelling problem. Change activity as needed. Ok to attend GYM exercise. Pain medication re ordered without change in dosage. Return as needed.

## 2017-04-10 NOTE — Progress Notes (Signed)
Subjective: 46 y.o. year old female patient presents using a cane complaining of pain and frequent swelling in right foot.  Since her last two injury her feet gets swollen and painful after prolonged ambulation or weight bearing.  Patient request for pain medication with increased dose. On feet more now since she is back to school and bad weather is making the pain worse. Blood sugar was 170 yesterday.   HPI: Had left shoulder surgery in July 31, 2016 for torn Rotator cuff muscle. Finished with her physical therapy. Foot pain is the same. Hx of a fall on11/10/17 and injured left shoulder and knee.  Hx of right foot injury 01/15/17.  Objective: Dermatologic: No abnormal skin lesion. Vascular: Pedal pulses are all palpable.Swelling is managed with compression socks. Orthopedic: No gross deformities. Neurologic: All epicritic and tactile sensations grossly intact.  Assessment: Tenosynovitis right foot. S/P Injury left side in 11/2//17, injury to right foot 01/15/17. Arthropathy both feet. Pain and swelling with extended weight bearing on both feet.  Treatment: Reviewed findings and available treatment options. Advised to keep her blood sugar down more.  Will try Diabetic shoes rather than increasing pain medication. Pain medication re ordered without change in dosage. Will prepare for diabetic shoe when authorization is granted by her PCP.

## 2017-04-15 DIAGNOSIS — G629 Polyneuropathy, unspecified: Secondary | ICD-10-CM | POA: Diagnosis not present

## 2017-04-15 DIAGNOSIS — I119 Hypertensive heart disease without heart failure: Secondary | ICD-10-CM | POA: Diagnosis not present

## 2017-04-15 DIAGNOSIS — J45909 Unspecified asthma, uncomplicated: Secondary | ICD-10-CM | POA: Diagnosis not present

## 2017-04-15 DIAGNOSIS — I10 Essential (primary) hypertension: Secondary | ICD-10-CM | POA: Diagnosis not present

## 2017-04-15 DIAGNOSIS — Z23 Encounter for immunization: Secondary | ICD-10-CM | POA: Diagnosis not present

## 2017-04-15 DIAGNOSIS — E119 Type 2 diabetes mellitus without complications: Secondary | ICD-10-CM | POA: Diagnosis not present

## 2017-04-20 DIAGNOSIS — J45901 Unspecified asthma with (acute) exacerbation: Secondary | ICD-10-CM | POA: Diagnosis not present

## 2017-05-08 ENCOUNTER — Ambulatory Visit (INDEPENDENT_AMBULATORY_CARE_PROVIDER_SITE_OTHER): Payer: Medicare Other | Admitting: Podiatry

## 2017-05-08 ENCOUNTER — Ambulatory Visit: Payer: Medicaid Other | Admitting: Podiatry

## 2017-05-08 ENCOUNTER — Encounter: Payer: Self-pay | Admitting: Podiatry

## 2017-05-08 DIAGNOSIS — E1165 Type 2 diabetes mellitus with hyperglycemia: Secondary | ICD-10-CM

## 2017-05-08 DIAGNOSIS — E118 Type 2 diabetes mellitus with unspecified complications: Secondary | ICD-10-CM | POA: Diagnosis not present

## 2017-05-08 DIAGNOSIS — IMO0002 Reserved for concepts with insufficient information to code with codable children: Secondary | ICD-10-CM

## 2017-05-08 DIAGNOSIS — M659 Synovitis and tenosynovitis, unspecified: Secondary | ICD-10-CM

## 2017-05-08 DIAGNOSIS — R600 Localized edema: Secondary | ICD-10-CM

## 2017-05-08 MED ORDER — OXYCODONE-ACETAMINOPHEN 10-325 MG PO TABS: 1.0000 | ORAL_TABLET | Freq: Two times a day (BID) | ORAL | 0 refills | Status: DC | PRN

## 2017-05-08 NOTE — Patient Instructions (Signed)
Both feet measured for diabetic shoes. Pain medication prescribed as per request. Return as needed.

## 2017-05-08 NOTE — Progress Notes (Signed)
Subjective: 46 y.o. year old IDDM female patient presents complaining of painful feet. Patient request for diabetic shoes and pain medication prescribed. Her last blood sugar was 170.  HPI: Had left shoulder surgery in July 31, 2016 for torn Rotator cuff muscle. Finished with her physical therapy. Foot pain is the same. Hx of a fall on11/10/17 and injured left shoulder and knee.  Hx of right foot injury 01/15/17.  Objective: Dermatologic: No abnormal findings. Vascular: Pedal pulses are all palpable.  Swelling under control with compression socks. Orthopedic: Contracted lesser digits  Neurologic: All epicritic and tactile sensations grossly intact.  Assessment: Tenosynovitis right foot. Status post injury left side 04/19/16, right foot 12/28/16. Arthropathy both feet. Frequent lower limb edema bilateral. Uncontrolled IDDM.  Treatment: Both feet measured for diabetic shoes. Will decrease pain medication dose when diabetic shoes are available.

## 2017-05-20 DIAGNOSIS — J45901 Unspecified asthma with (acute) exacerbation: Secondary | ICD-10-CM | POA: Diagnosis not present

## 2017-05-22 DIAGNOSIS — G4733 Obstructive sleep apnea (adult) (pediatric): Secondary | ICD-10-CM | POA: Diagnosis not present

## 2017-06-05 ENCOUNTER — Encounter: Payer: Self-pay | Admitting: Podiatry

## 2017-06-05 ENCOUNTER — Ambulatory Visit (INDEPENDENT_AMBULATORY_CARE_PROVIDER_SITE_OTHER): Payer: Medicare Other | Admitting: Podiatry

## 2017-06-05 DIAGNOSIS — M79672 Pain in left foot: Secondary | ICD-10-CM | POA: Diagnosis not present

## 2017-06-05 DIAGNOSIS — M79671 Pain in right foot: Secondary | ICD-10-CM

## 2017-06-05 DIAGNOSIS — L600 Ingrowing nail: Secondary | ICD-10-CM

## 2017-06-05 DIAGNOSIS — M659 Synovitis and tenosynovitis, unspecified: Secondary | ICD-10-CM

## 2017-06-05 MED ORDER — OXYCODONE-ACETAMINOPHEN 10-325 MG PO TABS: 1.0000 | ORAL_TABLET | Freq: Two times a day (BID) | ORAL | 0 refills | Status: DC | PRN

## 2017-06-05 NOTE — Progress Notes (Signed)
  Subjective: 47 y.o. year old diabetic female patient presents complaining of painful nails on right great toe and pain on plantar surface lateral 1/2 of both feet with shooting pain going up to lateral aspect of legs. Patient requests toe nails trimmed.  Patient is also picking up her diabetic shoes. Yesterday blood sugar reading was 120.  HPI: Had left shoulder surgery in July 31, 2016 for torn Rotator cuff muscle. Finished with her physical therapy. Foot pain is the same. Hx of a fall on11/10/17 and injured left shoulder and knee.  Hx of right foot injury 01/15/17.  Objective: Dermatologic: Thick yellow deformed nails x 10. Ingrown right hallucal nail. Vascular: Pedal pulses are all palpable. Orthopedic: Contracted lesser digits  Neurologic: All epicritic and tactile sensations grossly intact.  Assessment: Dystrophic mycotic nails x 10. Tenosynovitis lateral column both feet.  Treatment: All mycotic nails and ingrown hallucal nails debrided.  Diabetic shoe dispensed with instruction. Return as needed.

## 2017-06-05 NOTE — Patient Instructions (Signed)
Seen for painful feet and ingrown nails. No acute changes noted. All nails debrided. Diabetic shoe dispensed. Return as needed.

## 2017-06-14 DIAGNOSIS — B9689 Other specified bacterial agents as the cause of diseases classified elsewhere: Secondary | ICD-10-CM | POA: Diagnosis not present

## 2017-06-14 DIAGNOSIS — N760 Acute vaginitis: Secondary | ICD-10-CM | POA: Diagnosis not present

## 2017-06-17 DIAGNOSIS — G4733 Obstructive sleep apnea (adult) (pediatric): Secondary | ICD-10-CM | POA: Diagnosis not present

## 2017-06-17 DIAGNOSIS — J301 Allergic rhinitis due to pollen: Secondary | ICD-10-CM | POA: Diagnosis not present

## 2017-06-17 DIAGNOSIS — J4540 Moderate persistent asthma, uncomplicated: Secondary | ICD-10-CM | POA: Diagnosis not present

## 2017-06-20 DIAGNOSIS — J45901 Unspecified asthma with (acute) exacerbation: Secondary | ICD-10-CM | POA: Diagnosis not present

## 2017-06-28 DIAGNOSIS — I119 Hypertensive heart disease without heart failure: Secondary | ICD-10-CM | POA: Diagnosis not present

## 2017-06-28 DIAGNOSIS — E119 Type 2 diabetes mellitus without complications: Secondary | ICD-10-CM | POA: Diagnosis not present

## 2017-06-28 DIAGNOSIS — J45909 Unspecified asthma, uncomplicated: Secondary | ICD-10-CM | POA: Diagnosis not present

## 2017-06-28 DIAGNOSIS — I10 Essential (primary) hypertension: Secondary | ICD-10-CM | POA: Diagnosis not present

## 2017-06-28 DIAGNOSIS — G629 Polyneuropathy, unspecified: Secondary | ICD-10-CM | POA: Diagnosis not present

## 2017-07-01 DIAGNOSIS — G4733 Obstructive sleep apnea (adult) (pediatric): Secondary | ICD-10-CM | POA: Diagnosis not present

## 2017-07-02 ENCOUNTER — Ambulatory Visit (INDEPENDENT_AMBULATORY_CARE_PROVIDER_SITE_OTHER): Payer: Medicare Other | Admitting: Podiatry

## 2017-07-02 DIAGNOSIS — R600 Localized edema: Secondary | ICD-10-CM | POA: Diagnosis not present

## 2017-07-02 DIAGNOSIS — M659 Synovitis and tenosynovitis, unspecified: Secondary | ICD-10-CM | POA: Diagnosis not present

## 2017-07-02 DIAGNOSIS — R262 Difficulty in walking, not elsewhere classified: Secondary | ICD-10-CM | POA: Diagnosis not present

## 2017-07-02 MED ORDER — OXYCODONE-ACETAMINOPHEN 10-325 MG PO TABS: 1.0000 | ORAL_TABLET | Freq: Two times a day (BID) | ORAL | 0 refills | Status: DC | PRN

## 2017-07-02 NOTE — Patient Instructions (Signed)
Seen for pain and swelling on both feet. Need to return to compression socks while in class and out of class. Return as needed.

## 2017-07-02 NOTE — Progress Notes (Signed)
  Subjective: 47 y.o. year old female patient presents complaining of painful feet.  Stated that she was seen her PCP for acute foot pain in left on 06/28/16.  She is also noting that right foot turns out side ways when she walk along with swelling. Also having pain and swelling on right lateral ankle. On feet off and on while in school. Stays in car in between classes.  HPI: Had left shoulder surgery in July 31, 2016 for torn Rotator cuff muscle.Finished with her physical therapy. Foot pain is the same. Hx of a fall on11/10/17 and injured left shoulder and knee. Hx of right foot injury 01/15/17  Objective: Dermatologic: No acute problems. Vascular: Pedal pulses are all palpable. No visible edema noted. Orthopedic: Normal range of motion forefoot or rearfoot. No acute changes noted. Neurologic: All epicritic and tactile sensations grossly intact.  Assessment: Chronic tenosynovitis bilateral foot.  Treatment: Reviewed clinical findings and available treatment options. Advised to get back to wearing compression socks for edema control. Pain medication re ordered.

## 2017-07-03 ENCOUNTER — Encounter: Payer: Self-pay | Admitting: Podiatry

## 2017-07-12 DIAGNOSIS — J45909 Unspecified asthma, uncomplicated: Secondary | ICD-10-CM | POA: Diagnosis not present

## 2017-07-12 DIAGNOSIS — I119 Hypertensive heart disease without heart failure: Secondary | ICD-10-CM | POA: Diagnosis not present

## 2017-07-12 DIAGNOSIS — E119 Type 2 diabetes mellitus without complications: Secondary | ICD-10-CM | POA: Diagnosis not present

## 2017-07-12 DIAGNOSIS — I10 Essential (primary) hypertension: Secondary | ICD-10-CM | POA: Diagnosis not present

## 2017-07-20 DIAGNOSIS — S86892A Other injury of other muscle(s) and tendon(s) at lower leg level, left leg, initial encounter: Secondary | ICD-10-CM | POA: Diagnosis not present

## 2017-07-21 DIAGNOSIS — J45901 Unspecified asthma with (acute) exacerbation: Secondary | ICD-10-CM | POA: Diagnosis not present

## 2017-07-30 DIAGNOSIS — J9600 Acute respiratory failure, unspecified whether with hypoxia or hypercapnia: Secondary | ICD-10-CM | POA: Diagnosis not present

## 2017-07-30 DIAGNOSIS — R05 Cough: Secondary | ICD-10-CM | POA: Diagnosis not present

## 2017-07-30 DIAGNOSIS — J4541 Moderate persistent asthma with (acute) exacerbation: Secondary | ICD-10-CM | POA: Diagnosis not present

## 2017-07-30 DIAGNOSIS — R0602 Shortness of breath: Secondary | ICD-10-CM | POA: Diagnosis not present

## 2017-07-30 DIAGNOSIS — I10 Essential (primary) hypertension: Secondary | ICD-10-CM | POA: Diagnosis not present

## 2017-07-30 DIAGNOSIS — E119 Type 2 diabetes mellitus without complications: Secondary | ICD-10-CM | POA: Diagnosis not present

## 2017-07-30 DIAGNOSIS — R069 Unspecified abnormalities of breathing: Secondary | ICD-10-CM | POA: Diagnosis not present

## 2017-07-31 ENCOUNTER — Ambulatory Visit: Payer: Medicaid Other | Admitting: Podiatry

## 2017-07-31 DIAGNOSIS — G4733 Obstructive sleep apnea (adult) (pediatric): Secondary | ICD-10-CM | POA: Diagnosis not present

## 2017-07-31 DIAGNOSIS — R05 Cough: Secondary | ICD-10-CM | POA: Diagnosis not present

## 2017-07-31 DIAGNOSIS — R0602 Shortness of breath: Secondary | ICD-10-CM | POA: Diagnosis not present

## 2017-07-31 DIAGNOSIS — E119 Type 2 diabetes mellitus without complications: Secondary | ICD-10-CM | POA: Diagnosis not present

## 2017-07-31 DIAGNOSIS — J9600 Acute respiratory failure, unspecified whether with hypoxia or hypercapnia: Secondary | ICD-10-CM | POA: Diagnosis not present

## 2017-07-31 DIAGNOSIS — Z794 Long term (current) use of insulin: Secondary | ICD-10-CM | POA: Diagnosis not present

## 2017-07-31 DIAGNOSIS — R000 Tachycardia, unspecified: Secondary | ICD-10-CM | POA: Diagnosis not present

## 2017-07-31 DIAGNOSIS — Z23 Encounter for immunization: Secondary | ICD-10-CM | POA: Diagnosis not present

## 2017-07-31 DIAGNOSIS — Z79891 Long term (current) use of opiate analgesic: Secondary | ICD-10-CM | POA: Diagnosis not present

## 2017-07-31 DIAGNOSIS — J4541 Moderate persistent asthma with (acute) exacerbation: Secondary | ICD-10-CM | POA: Diagnosis not present

## 2017-07-31 DIAGNOSIS — G8929 Other chronic pain: Secondary | ICD-10-CM | POA: Diagnosis not present

## 2017-07-31 DIAGNOSIS — I10 Essential (primary) hypertension: Secondary | ICD-10-CM | POA: Diagnosis not present

## 2017-08-02 DIAGNOSIS — Z79891 Long term (current) use of opiate analgesic: Secondary | ICD-10-CM | POA: Diagnosis not present

## 2017-08-02 DIAGNOSIS — I10 Essential (primary) hypertension: Secondary | ICD-10-CM | POA: Diagnosis not present

## 2017-08-02 DIAGNOSIS — Z794 Long term (current) use of insulin: Secondary | ICD-10-CM | POA: Diagnosis not present

## 2017-08-02 DIAGNOSIS — E119 Type 2 diabetes mellitus without complications: Secondary | ICD-10-CM | POA: Diagnosis not present

## 2017-08-02 DIAGNOSIS — J9600 Acute respiratory failure, unspecified whether with hypoxia or hypercapnia: Secondary | ICD-10-CM | POA: Diagnosis not present

## 2017-08-02 DIAGNOSIS — G8929 Other chronic pain: Secondary | ICD-10-CM | POA: Diagnosis not present

## 2017-08-02 DIAGNOSIS — J4541 Moderate persistent asthma with (acute) exacerbation: Secondary | ICD-10-CM | POA: Diagnosis not present

## 2017-08-02 DIAGNOSIS — Z23 Encounter for immunization: Secondary | ICD-10-CM | POA: Diagnosis not present

## 2017-08-02 DIAGNOSIS — R000 Tachycardia, unspecified: Secondary | ICD-10-CM | POA: Diagnosis not present

## 2017-08-05 ENCOUNTER — Ambulatory Visit (INDEPENDENT_AMBULATORY_CARE_PROVIDER_SITE_OTHER): Payer: Medicare Other | Admitting: Podiatry

## 2017-08-05 DIAGNOSIS — M21962 Unspecified acquired deformity of left lower leg: Secondary | ICD-10-CM | POA: Diagnosis not present

## 2017-08-05 DIAGNOSIS — M659 Synovitis and tenosynovitis, unspecified: Secondary | ICD-10-CM

## 2017-08-05 DIAGNOSIS — E119 Type 2 diabetes mellitus without complications: Secondary | ICD-10-CM | POA: Diagnosis not present

## 2017-08-05 DIAGNOSIS — J45909 Unspecified asthma, uncomplicated: Secondary | ICD-10-CM | POA: Diagnosis not present

## 2017-08-05 DIAGNOSIS — G629 Polyneuropathy, unspecified: Secondary | ICD-10-CM | POA: Diagnosis not present

## 2017-08-05 DIAGNOSIS — I119 Hypertensive heart disease without heart failure: Secondary | ICD-10-CM | POA: Diagnosis not present

## 2017-08-05 DIAGNOSIS — I10 Essential (primary) hypertension: Secondary | ICD-10-CM | POA: Diagnosis not present

## 2017-08-05 DIAGNOSIS — R262 Difficulty in walking, not elsewhere classified: Secondary | ICD-10-CM

## 2017-08-05 MED ORDER — OXYCODONE-ACETAMINOPHEN 10-325 MG PO TABS: 1.0000 | ORAL_TABLET | Freq: Three times a day (TID) | ORAL | 0 refills | Status: DC | PRN

## 2017-08-05 NOTE — Patient Instructions (Signed)
Seen for pain in left foot. Noted of weak and unstable first metatarsal with lateral weight shifting. Reviewed available treatment option. 7 day supply pain medication prescribed. Return as needed.

## 2017-08-05 NOTE — Progress Notes (Signed)
Subjective: 47 y.o. year old female patient presents complaining of pain in left foot. Patient points dorsolateral aspect of the left foot being the source of foot pain. Patient walks assisted by a cane. Still in school.   HPI: Had left shoulder surgery in July 31, 2016 for torn Rotator cuff muscle.Finished with her physical therapy. Foot pain is the same. Hx of a fall on11/10/17 and injured left shoulder and knee. Hx of right foot injury 01/15/17  Objective: Dermatologic: No acute changes noted. Vascular: Pedal pulses are all palpable. Orthopedic: Hypermobile first ray with lateral weight shifting on left foot. Pain at lateral column with weight bearing. Neurologic: All epicritic and tactile sensations grossly intact.  Assessment: Tenosynovitis left foot. Hypermobile first ray with lateral weight shifting.  Pain with ambulation.  Treatment: Reviewed findings and available treatment options, conservative and surgical. Reviewed surgical option would require 2-3 month off from school activity. 7 day supply pain medication prescribed. Return as needed.

## 2017-08-06 ENCOUNTER — Ambulatory Visit: Payer: Medicaid Other | Admitting: Podiatry

## 2017-08-06 ENCOUNTER — Encounter: Payer: Self-pay | Admitting: Podiatry

## 2017-08-06 DIAGNOSIS — J45901 Unspecified asthma with (acute) exacerbation: Secondary | ICD-10-CM | POA: Diagnosis not present

## 2017-08-12 DIAGNOSIS — G4733 Obstructive sleep apnea (adult) (pediatric): Secondary | ICD-10-CM | POA: Diagnosis not present

## 2017-08-12 DIAGNOSIS — R5383 Other fatigue: Secondary | ICD-10-CM | POA: Diagnosis not present

## 2017-08-12 DIAGNOSIS — J301 Allergic rhinitis due to pollen: Secondary | ICD-10-CM | POA: Diagnosis not present

## 2017-08-12 DIAGNOSIS — J4540 Moderate persistent asthma, uncomplicated: Secondary | ICD-10-CM | POA: Diagnosis not present

## 2017-08-14 ENCOUNTER — Ambulatory Visit (INDEPENDENT_AMBULATORY_CARE_PROVIDER_SITE_OTHER): Payer: Medicare Other | Admitting: Podiatry

## 2017-08-14 ENCOUNTER — Encounter: Payer: Self-pay | Admitting: Podiatry

## 2017-08-14 DIAGNOSIS — M659 Synovitis and tenosynovitis, unspecified: Secondary | ICD-10-CM | POA: Diagnosis not present

## 2017-08-14 DIAGNOSIS — M21962 Unspecified acquired deformity of left lower leg: Secondary | ICD-10-CM

## 2017-08-14 DIAGNOSIS — M79672 Pain in left foot: Secondary | ICD-10-CM | POA: Diagnosis not present

## 2017-08-14 DIAGNOSIS — R262 Difficulty in walking, not elsewhere classified: Secondary | ICD-10-CM | POA: Diagnosis not present

## 2017-08-14 NOTE — Progress Notes (Signed)
Subjective: 47 y.o. year old female patient presents stating that she is ready to have her left foot corrected. Patient walks with cane. Stated that she is tired of having foot pain.  HPI: Had left shoulder surgery in July 31, 2016 for torn Rotator cuff muscle. Hx of a fall on11/10/17 and injured left shoulder and knee. Hx of right foot injury 01/15/17  Podiatry surgical history: 01/10/12 initial fusion of the first MCJ right, and procedure repeated on  right foot 07/17/12 for failed fusion. All internal fixation devices removed from right foot in February 2016.  In April 2016, Excision neuroma and Cotton osteotomy with bone graft done on right foot.  Objective: Dermatologic: No abnormal findings. Vascular: Pedal pulses are all palpable. Orthopedic: Severe dorsal elevation of the first ray bilateral. Neurologic: All epicritic and tactile sensations grossly intact. Radiographic examination reveal severely elevated first metatarsal bone on left foot.  Assessment: Pain with ambulation left foot. Metatarsus primus elevatus left.  Treatment: On her previous visit patient was counseled for pros and cons with possible complication with the surgical options.  As per request surgery consent form reviewed for Lapidus fusion left foot.

## 2017-08-14 NOTE — Patient Instructions (Signed)
Seen for pain in left foot. As per request, surgery consent form reviewed for Lapidus fusion left foot, which will be placed in cast. Call if there is any questions regarding surgery.

## 2017-08-17 DIAGNOSIS — I10 Essential (primary) hypertension: Secondary | ICD-10-CM | POA: Diagnosis not present

## 2017-08-18 DIAGNOSIS — J45901 Unspecified asthma with (acute) exacerbation: Secondary | ICD-10-CM | POA: Diagnosis not present

## 2017-08-19 DIAGNOSIS — E119 Type 2 diabetes mellitus without complications: Secondary | ICD-10-CM | POA: Diagnosis not present

## 2017-08-20 ENCOUNTER — Telehealth: Payer: Self-pay | Admitting: *Deleted

## 2017-08-20 NOTE — Telephone Encounter (Signed)
Surgery benefits are as follows: For CPT code 28297(lapidus infusion) there is no copay and no deductible. The plan pays 100% for both professional and facility fee services. Reference # for the call is [id number]. Patient notified and wants to schedule this Friday March 8th

## 2017-08-21 NOTE — Telephone Encounter (Signed)
Surgery will be scheduled on 08/30/17

## 2017-08-28 DIAGNOSIS — Z0100 Encounter for examination of eyes and vision without abnormal findings: Secondary | ICD-10-CM | POA: Diagnosis not present

## 2017-08-28 DIAGNOSIS — J45909 Unspecified asthma, uncomplicated: Secondary | ICD-10-CM | POA: Diagnosis not present

## 2017-08-28 DIAGNOSIS — Z0000 Encounter for general adult medical examination without abnormal findings: Secondary | ICD-10-CM | POA: Diagnosis not present

## 2017-08-28 DIAGNOSIS — I10 Essential (primary) hypertension: Secondary | ICD-10-CM | POA: Diagnosis not present

## 2017-08-28 DIAGNOSIS — E119 Type 2 diabetes mellitus without complications: Secondary | ICD-10-CM | POA: Diagnosis not present

## 2017-08-28 DIAGNOSIS — Z0110 Encounter for examination of ears and hearing without abnormal findings: Secondary | ICD-10-CM | POA: Diagnosis not present

## 2017-08-29 ENCOUNTER — Other Ambulatory Visit: Payer: Self-pay | Admitting: Podiatry

## 2017-08-29 DIAGNOSIS — G4733 Obstructive sleep apnea (adult) (pediatric): Secondary | ICD-10-CM | POA: Diagnosis not present

## 2017-08-29 MED ORDER — OXYCODONE-ACETAMINOPHEN 7.5-325 MG PO TABS: 1.0000 | ORAL_TABLET | Freq: Four times a day (QID) | ORAL | 0 refills | Status: DC | PRN

## 2017-08-30 ENCOUNTER — Encounter: Payer: Self-pay | Admitting: Podiatry

## 2017-08-30 DIAGNOSIS — Z79899 Other long term (current) drug therapy: Secondary | ICD-10-CM | POA: Diagnosis not present

## 2017-08-30 DIAGNOSIS — E1165 Type 2 diabetes mellitus with hyperglycemia: Secondary | ICD-10-CM | POA: Diagnosis not present

## 2017-08-30 DIAGNOSIS — I252 Old myocardial infarction: Secondary | ICD-10-CM | POA: Diagnosis not present

## 2017-08-30 DIAGNOSIS — I10 Essential (primary) hypertension: Secondary | ICD-10-CM | POA: Diagnosis not present

## 2017-08-30 DIAGNOSIS — Z794 Long term (current) use of insulin: Secondary | ICD-10-CM | POA: Diagnosis not present

## 2017-09-04 ENCOUNTER — Encounter: Payer: Medicaid Other | Admitting: Podiatry

## 2017-09-13 DIAGNOSIS — R350 Frequency of micturition: Secondary | ICD-10-CM | POA: Diagnosis not present

## 2017-09-18 DIAGNOSIS — J45901 Unspecified asthma with (acute) exacerbation: Secondary | ICD-10-CM | POA: Diagnosis not present

## 2017-09-26 ENCOUNTER — Ambulatory Visit (INDEPENDENT_AMBULATORY_CARE_PROVIDER_SITE_OTHER): Payer: Medicare Other | Admitting: Podiatry

## 2017-09-26 DIAGNOSIS — M79671 Pain in right foot: Secondary | ICD-10-CM | POA: Diagnosis not present

## 2017-09-26 DIAGNOSIS — R262 Difficulty in walking, not elsewhere classified: Secondary | ICD-10-CM

## 2017-09-26 DIAGNOSIS — M21962 Unspecified acquired deformity of left lower leg: Secondary | ICD-10-CM | POA: Diagnosis not present

## 2017-09-26 DIAGNOSIS — M79672 Pain in left foot: Secondary | ICD-10-CM | POA: Diagnosis not present

## 2017-09-26 MED ORDER — OXYCODONE-ACETAMINOPHEN 7.5-325 MG PO TABS: 1.0000 | ORAL_TABLET | Freq: Three times a day (TID) | ORAL | 0 refills | Status: DC | PRN

## 2017-09-26 NOTE — Patient Instructions (Signed)
Seen for pain in right foot. Still working on blood sugar control. Pain medication ordered for 7 days. Referral placed for Pain management.

## 2017-09-26 NOTE — Progress Notes (Signed)
Subjective: 47 y.o. year old female patient presents complaining of pain in right foot and request for pain medication. She was scheduled for foot surgery last month but had to cancel due to her uncontrolled blood sugar. She has visited her PCP once since then and her blood glucose level is still over 300.  Patient stated that she will return back to her PCP and will follow through with Pain management appointment. Her next PCP appointment is set for 09/28/17.  HPI: Had left shoulder surgery in July 31, 2016 for torn Rotator cuff muscle. Hx of a fall on11/10/17 and injured left shoulder and knee.   Podiatry surgical history: Hx of right foot injury 01/15/17. 01/10/12 initial fusion of the first MCJ right, and procedure repeated on right foot 07/17/12 for failed fusion. All internal fixation devices removed from right foot in February 2016.  In April 2016, Excision neuroma and Cotton osteotomy with bone graft done on right foot.  Objective: Dermatologic: TNormal findings. Vascular: Pedal pulses are all palpable. Orthopedic: Elevated first ray bilateral. Neurologic: All epicritic and tactile sensations grossly intact.  Assessment: Bilateral foot pain. Metatarsus primus elevatus bilateral. Difficulty walking.  Treatment: Reviewed clinical findings and importance of blood glucose control. As per discussion patient will refer to pain management facility. As per request pain medication 7 day supply prescribed. Referral placed for Pain management center.

## 2017-09-28 ENCOUNTER — Encounter: Payer: Self-pay | Admitting: Podiatry

## 2017-09-29 DIAGNOSIS — G4733 Obstructive sleep apnea (adult) (pediatric): Secondary | ICD-10-CM | POA: Diagnosis not present

## 2017-10-09 DIAGNOSIS — S82309A Unspecified fracture of lower end of unspecified tibia, initial encounter for closed fracture: Secondary | ICD-10-CM | POA: Diagnosis not present

## 2017-10-09 DIAGNOSIS — M19079 Primary osteoarthritis, unspecified ankle and foot: Secondary | ICD-10-CM | POA: Diagnosis not present

## 2017-10-09 DIAGNOSIS — Q7270 Split foot, unspecified lower limb: Secondary | ICD-10-CM | POA: Diagnosis not present

## 2017-10-09 DIAGNOSIS — E119 Type 2 diabetes mellitus without complications: Secondary | ICD-10-CM | POA: Diagnosis not present

## 2017-10-09 DIAGNOSIS — I119 Hypertensive heart disease without heart failure: Secondary | ICD-10-CM | POA: Diagnosis not present

## 2017-10-09 DIAGNOSIS — J45909 Unspecified asthma, uncomplicated: Secondary | ICD-10-CM | POA: Diagnosis not present

## 2017-10-09 DIAGNOSIS — I10 Essential (primary) hypertension: Secondary | ICD-10-CM | POA: Diagnosis not present

## 2017-10-09 DIAGNOSIS — G629 Polyneuropathy, unspecified: Secondary | ICD-10-CM | POA: Diagnosis not present

## 2017-10-16 DIAGNOSIS — M542 Cervicalgia: Secondary | ICD-10-CM | POA: Diagnosis present

## 2017-10-16 DIAGNOSIS — I10 Essential (primary) hypertension: Secondary | ICD-10-CM | POA: Diagnosis not present

## 2017-10-18 DIAGNOSIS — J45901 Unspecified asthma with (acute) exacerbation: Secondary | ICD-10-CM | POA: Diagnosis not present

## 2017-10-21 DIAGNOSIS — D170 Benign lipomatous neoplasm of skin and subcutaneous tissue of head, face and neck: Secondary | ICD-10-CM | POA: Diagnosis not present

## 2017-10-21 DIAGNOSIS — R079 Chest pain, unspecified: Secondary | ICD-10-CM | POA: Diagnosis not present

## 2017-10-21 DIAGNOSIS — Z794 Long term (current) use of insulin: Secondary | ICD-10-CM | POA: Diagnosis not present

## 2017-10-21 DIAGNOSIS — Z79891 Long term (current) use of opiate analgesic: Secondary | ICD-10-CM | POA: Diagnosis not present

## 2017-10-21 DIAGNOSIS — E119 Type 2 diabetes mellitus without complications: Secondary | ICD-10-CM | POA: Diagnosis not present

## 2017-10-21 DIAGNOSIS — I160 Hypertensive urgency: Secondary | ICD-10-CM | POA: Diagnosis not present

## 2017-10-21 DIAGNOSIS — M549 Dorsalgia, unspecified: Secondary | ICD-10-CM | POA: Diagnosis not present

## 2017-10-21 DIAGNOSIS — I10 Essential (primary) hypertension: Secondary | ICD-10-CM | POA: Diagnosis not present

## 2017-10-21 DIAGNOSIS — Z79899 Other long term (current) drug therapy: Secondary | ICD-10-CM | POA: Diagnosis not present

## 2017-10-21 DIAGNOSIS — Z7984 Long term (current) use of oral hypoglycemic drugs: Secondary | ICD-10-CM | POA: Diagnosis not present

## 2017-10-24 ENCOUNTER — Ambulatory Visit (INDEPENDENT_AMBULATORY_CARE_PROVIDER_SITE_OTHER): Payer: Medicare Other | Admitting: Podiatry

## 2017-10-24 DIAGNOSIS — M21962 Unspecified acquired deformity of left lower leg: Secondary | ICD-10-CM

## 2017-10-24 MED ORDER — OXYCODONE-ACETAMINOPHEN 7.5-325 MG PO TABS: 1.0000 | ORAL_TABLET | Freq: Four times a day (QID) | ORAL | 0 refills | Status: DC | PRN

## 2017-10-24 NOTE — Progress Notes (Signed)
Blood sugar level is down to 200 level for the last 9 days. Will do her left foot Lapidus fusion on 11/01/17.

## 2017-10-25 ENCOUNTER — Telehealth: Payer: Self-pay | Admitting: *Deleted

## 2017-10-25 DIAGNOSIS — M2560 Stiffness of unspecified joint, not elsewhere classified: Secondary | ICD-10-CM | POA: Diagnosis not present

## 2017-10-25 DIAGNOSIS — M2550 Pain in unspecified joint: Secondary | ICD-10-CM | POA: Diagnosis not present

## 2017-10-25 DIAGNOSIS — M542 Cervicalgia: Secondary | ICD-10-CM | POA: Diagnosis not present

## 2017-10-25 DIAGNOSIS — R293 Abnormal posture: Secondary | ICD-10-CM | POA: Diagnosis not present

## 2017-10-25 NOTE — Telephone Encounter (Signed)
Spoke with Virgilio Belling at [facility] and she states Dr. Mirna Mires is willing to see patient but would rather wait until after she has had the surgical procedure so any pain medications prescribed up until and during that point can be completed before he takes over care.   Virgilio Belling is going to put referral on hold until the end of June. If the patient can continue to keep her sugars down and the surgery is able to be perfomed on 5/17 is it necessary to keep the referral? Also that patient was referred to them in the past and has no showed an appointments twice. If another appointment is scheduled for her and she no shows they will not be able to accept her as a new patient.

## 2017-10-28 ENCOUNTER — Encounter: Payer: Self-pay | Admitting: Podiatry

## 2017-10-28 NOTE — Patient Instructions (Signed)
Blood sugar level is down to 200 level for the last 9 days. Will do her left foot Lapidus fusion on 11/01/17.

## 2017-10-29 DIAGNOSIS — G4733 Obstructive sleep apnea (adult) (pediatric): Secondary | ICD-10-CM | POA: Diagnosis not present

## 2017-10-30 ENCOUNTER — Telehealth: Payer: Self-pay | Admitting: *Deleted

## 2017-10-30 NOTE — Telephone Encounter (Signed)
Called patient to get her  blood sugar recordings. Patient states her am blood sugar reading is 125 and when she checked her blood sugar at lunch it was 122.

## 2017-10-31 ENCOUNTER — Other Ambulatory Visit: Payer: Self-pay | Admitting: Podiatry

## 2017-10-31 MED ORDER — OXYCODONE-ACETAMINOPHEN 7.5-325 MG PO TABS: 1.0000 | ORAL_TABLET | Freq: Four times a day (QID) | ORAL | 0 refills | Status: DC | PRN

## 2017-11-01 DIAGNOSIS — M216X9 Other acquired deformities of unspecified foot: Secondary | ICD-10-CM | POA: Diagnosis not present

## 2017-11-01 DIAGNOSIS — M216X2 Other acquired deformities of left foot: Secondary | ICD-10-CM | POA: Diagnosis not present

## 2017-11-06 ENCOUNTER — Encounter: Payer: Self-pay | Admitting: Podiatry

## 2017-11-06 ENCOUNTER — Ambulatory Visit (INDEPENDENT_AMBULATORY_CARE_PROVIDER_SITE_OTHER): Payer: Medicare Other | Admitting: Podiatry

## 2017-11-06 DIAGNOSIS — Z9889 Other specified postprocedural states: Secondary | ICD-10-CM

## 2017-11-06 MED ORDER — OXYCODONE-ACETAMINOPHEN 7.5-325 MG PO TABS: 1.0000 | ORAL_TABLET | Freq: Four times a day (QID) | ORAL | 0 refills | Status: DC | PRN

## 2017-11-06 NOTE — Progress Notes (Signed)
5 day post op S/P Lapidus fusion 1st MCJ left foot 11/01/17. Denies any foot discomfort other than irritation on the big toe from the edge of cast. Reduced part of distal end of cast to stop rubbing against the big toe. Reviewed previous finding of the tightness of Achilles tendon and need for a lengthening procedure. Will do Gastroc recession on left lower limb in next few weeks.. Surgery consent form reviewed and signed.

## 2017-11-06 NOTE — Patient Instructions (Signed)
5 day post op following left foot surgery 11/01/17. Cast adjusted at toe area. Will plan on Tendon lengthening procedure left on 11/15/17.

## 2017-11-13 DIAGNOSIS — E119 Type 2 diabetes mellitus without complications: Secondary | ICD-10-CM | POA: Diagnosis not present

## 2017-11-13 DIAGNOSIS — I10 Essential (primary) hypertension: Secondary | ICD-10-CM | POA: Diagnosis not present

## 2017-11-14 ENCOUNTER — Other Ambulatory Visit: Payer: Self-pay | Admitting: Podiatry

## 2017-11-14 MED ORDER — OXYCODONE-ACETAMINOPHEN 7.5-325 MG PO TABS: 1.0000 | ORAL_TABLET | Freq: Four times a day (QID) | ORAL | 0 refills | Status: DC | PRN

## 2017-11-15 DIAGNOSIS — E119 Type 2 diabetes mellitus without complications: Secondary | ICD-10-CM | POA: Diagnosis not present

## 2017-11-15 DIAGNOSIS — M6702 Short Achilles tendon (acquired), left ankle: Secondary | ICD-10-CM | POA: Diagnosis not present

## 2017-11-15 DIAGNOSIS — I10 Essential (primary) hypertension: Secondary | ICD-10-CM | POA: Diagnosis not present

## 2017-11-18 DIAGNOSIS — S82309A Unspecified fracture of lower end of unspecified tibia, initial encounter for closed fracture: Secondary | ICD-10-CM | POA: Diagnosis not present

## 2017-11-18 DIAGNOSIS — J45901 Unspecified asthma with (acute) exacerbation: Secondary | ICD-10-CM | POA: Diagnosis not present

## 2017-11-18 NOTE — Telephone Encounter (Signed)
ok 

## 2017-11-20 ENCOUNTER — Encounter: Payer: Medicare Other | Admitting: Podiatry

## 2017-11-21 ENCOUNTER — Other Ambulatory Visit: Payer: Self-pay | Admitting: Podiatry

## 2017-11-21 MED ORDER — OXYCODONE-ACETAMINOPHEN 7.5-325 MG PO TABS: 1.0000 | ORAL_TABLET | Freq: Four times a day (QID) | ORAL | 0 refills | Status: AC | PRN

## 2017-11-22 DIAGNOSIS — M6702 Short Achilles tendon (acquired), left ankle: Secondary | ICD-10-CM | POA: Diagnosis not present

## 2017-11-27 ENCOUNTER — Encounter: Payer: Self-pay | Admitting: Podiatry

## 2017-11-27 ENCOUNTER — Ambulatory Visit (INDEPENDENT_AMBULATORY_CARE_PROVIDER_SITE_OTHER): Payer: Medicare Other | Admitting: Podiatry

## 2017-11-27 DIAGNOSIS — Z9889 Other specified postprocedural states: Secondary | ICD-10-CM

## 2017-11-27 NOTE — Progress Notes (Signed)
5 day post op following Gastroc Recession left, 11/22/17, 4 weeks po Lapidus fusion 1st MCJ left foot 11/01/17. All digits are normal in color. Tight ring around cast cut loose and taped over uneven area. Patient felt relief and feeling fine. Will do another cast change next week.

## 2017-11-27 NOTE — Patient Instructions (Signed)
One week post op wound. Tight cast loosened. Return in one week for cast replacement. If pain or discomfort continues will change cast sooner.

## 2017-11-29 DIAGNOSIS — G4733 Obstructive sleep apnea (adult) (pediatric): Secondary | ICD-10-CM | POA: Diagnosis not present

## 2017-12-02 ENCOUNTER — Encounter: Payer: Self-pay | Admitting: Podiatry

## 2017-12-02 ENCOUNTER — Ambulatory Visit (INDEPENDENT_AMBULATORY_CARE_PROVIDER_SITE_OTHER): Payer: Medicare Other | Admitting: Podiatry

## 2017-12-02 DIAGNOSIS — R262 Difficulty in walking, not elsewhere classified: Secondary | ICD-10-CM

## 2017-12-02 DIAGNOSIS — R600 Localized edema: Secondary | ICD-10-CM

## 2017-12-02 DIAGNOSIS — M79671 Pain in right foot: Secondary | ICD-10-CM | POA: Diagnosis not present

## 2017-12-02 MED ORDER — CIPROFLOXACIN HCL 500 MG PO TABS: 500.0000 mg | ORAL_TABLET | Freq: Two times a day (BID) | ORAL | 0 refills | Status: DC

## 2017-12-02 MED ORDER — OXYCODONE-ACETAMINOPHEN 7.5-325 MG PO TABS: 1.0000 | ORAL_TABLET | Freq: Four times a day (QID) | ORAL | 0 refills | Status: DC | PRN

## 2017-12-02 NOTE — Patient Instructions (Signed)
Cast removed. Noted of edema on foot. Una boot placed and and CAM walker used. Will wait till swelling go down before the next cast. Return in one week.

## 2017-12-02 NOTE — Progress Notes (Signed)
10 day post op following Gastroc Recession left, 11/22/17, 4 weeks po Lapidus fusion 1st MCJ left foot 11/01/17. Patient complained of cast being tight. Cast removed. Noted of some forefoot edema. Positive sign of old erythema (darker red) surrounding surgical site posterior calf without edema. Forefoot wrapped with Una boot to reduce swelling. Right lower limb placed in CAM walker with instruction to stay off of feet. Return in one week to place back in cast.

## 2017-12-04 ENCOUNTER — Encounter: Payer: Medicare Other | Admitting: Podiatry

## 2017-12-09 ENCOUNTER — Telehealth: Payer: Self-pay | Admitting: *Deleted

## 2017-12-09 NOTE — Telephone Encounter (Signed)
Left message on [facility] vm to keep referral for this patient as most likely she will still need to be managed by pain management

## 2017-12-10 ENCOUNTER — Ambulatory Visit (INDEPENDENT_AMBULATORY_CARE_PROVIDER_SITE_OTHER): Payer: Medicare Other | Admitting: Podiatry

## 2017-12-10 ENCOUNTER — Encounter: Payer: Self-pay | Admitting: Podiatry

## 2017-12-10 VITALS — Temp 98.2°F

## 2017-12-10 DIAGNOSIS — R600 Localized edema: Secondary | ICD-10-CM

## 2017-12-10 DIAGNOSIS — M65979 Unspecified synovitis and tenosynovitis, unspecified ankle and foot: Secondary | ICD-10-CM

## 2017-12-10 DIAGNOSIS — M659 Synovitis and tenosynovitis, unspecified: Secondary | ICD-10-CM

## 2017-12-10 DIAGNOSIS — M79672 Pain in left foot: Secondary | ICD-10-CM | POA: Diagnosis not present

## 2017-12-10 DIAGNOSIS — Z9889 Other specified postprocedural states: Secondary | ICD-10-CM

## 2017-12-10 DIAGNOSIS — M21962 Unspecified acquired deformity of left lower leg: Secondary | ICD-10-CM

## 2017-12-10 NOTE — Patient Instructions (Signed)
4 and a half week following the left foot surgery. Noted of foot edema has subsided but has excess edema just below knee joint with pain when squeezed. Will do screening test for possible blood clots. May go to ER to have this test done. Stay in wheelchair and minimum weight bearing. Return in one week.

## 2017-12-11 ENCOUNTER — Telehealth: Payer: Self-pay | Admitting: *Deleted

## 2017-12-11 ENCOUNTER — Encounter: Payer: Self-pay | Admitting: Podiatry

## 2017-12-11 DIAGNOSIS — I82432 Acute embolism and thrombosis of left popliteal vein: Secondary | ICD-10-CM | POA: Diagnosis not present

## 2017-12-11 DIAGNOSIS — I2699 Other pulmonary embolism without acute cor pulmonale: Secondary | ICD-10-CM | POA: Diagnosis not present

## 2017-12-11 DIAGNOSIS — I82412 Acute embolism and thrombosis of left femoral vein: Secondary | ICD-10-CM | POA: Diagnosis not present

## 2017-12-11 NOTE — Telephone Encounter (Signed)
Patient called.

## 2017-12-11 NOTE — Progress Notes (Signed)
Subjective: 47 year old female presents for 19 day post op following Gastroc Recession left, 11/22/17, 5 and a half weeks poLapidus fusion 1st MCJ left foot 11/01/17. Due to excess forefoot edema, her cast was taken off and placed in Unaboot and CAM walker during her last visit, 12/02/17. Stated that her foot is feeling better. She is in process of getting motorized wheelchair.  Objective: Surgical skin wound has healed well and well approximated. Post op X-ray of the left foot reveal dorsally displaced first metatarsal at the fusion site with hardware in place. Positive of tenderness with enlarged calf proximal 1/3 of left lower limb. Patient experiences pain when pressed.   Plan: Reviewed findings and concern on possible Venous stasis or thrombosis on left lower limb at tenderness area. Advised to seek ER to have the left calf checked out to rule out possible blood clot. Patient is placed back in CAM walker and encouraged to be off weight bearing. Will submit paper work for motorized wheel chair.

## 2017-12-12 NOTE — Telephone Encounter (Signed)
Patient called and wanted to let Dr. Caffie Pinto know that she did go to the emergency room and she did indeed have as blood clot in her lungs. She has been placed on blood thinners and will need to follow up with her PCP

## 2017-12-16 ENCOUNTER — Ambulatory Visit (INDEPENDENT_AMBULATORY_CARE_PROVIDER_SITE_OTHER): Payer: Medicare Other | Admitting: Podiatry

## 2017-12-16 DIAGNOSIS — Z9889 Other specified postprocedural states: Secondary | ICD-10-CM

## 2017-12-16 MED ORDER — OXYCODONE-ACETAMINOPHEN 7.5-325 MG PO TABS: 1.0000 | ORAL_TABLET | Freq: Four times a day (QID) | ORAL | 0 refills | Status: DC | PRN

## 2017-12-16 NOTE — Patient Instructions (Signed)
Follow up on left foot. Patient been to ER and diagnosed with DVT.  Continue with instruction given by Vascular specialist. Stay in CAM walker. Return in 3 weeks.

## 2017-12-16 NOTE — Progress Notes (Signed)
Subjective: 47 year old female presents for post op following Gastroc Recession left, 11/22/17, andLapidus fusion 1st MCJ left foot 11/01/17.  HPI: During her last week visit, it was noted that she had excess edema at proximal 1/2 of left lower limb and advised to to ER  She was diagnosed with DVT and is being treated.  Objective: Using CAM walker and a cane ambulates well. Reduced edema and tenderness noted at proximal 1/2 of left lower limb.  Plan: Continue using CAM walker and a cane for ambulation if needed. Advised to follow instruction given by her vascular specialist to treat DVT. Return in 3 weeks.

## 2017-12-17 ENCOUNTER — Encounter: Payer: Medicare Other | Admitting: Podiatry

## 2017-12-18 ENCOUNTER — Encounter: Payer: Self-pay | Admitting: Podiatry

## 2017-12-18 DIAGNOSIS — S82309A Unspecified fracture of lower end of unspecified tibia, initial encounter for closed fracture: Secondary | ICD-10-CM | POA: Diagnosis not present

## 2017-12-23 DIAGNOSIS — M19079 Primary osteoarthritis, unspecified ankle and foot: Secondary | ICD-10-CM | POA: Diagnosis not present

## 2017-12-23 DIAGNOSIS — Z9889 Other specified postprocedural states: Secondary | ICD-10-CM | POA: Diagnosis not present

## 2017-12-27 DIAGNOSIS — I10 Essential (primary) hypertension: Secondary | ICD-10-CM | POA: Diagnosis not present

## 2017-12-27 DIAGNOSIS — E119 Type 2 diabetes mellitus without complications: Secondary | ICD-10-CM | POA: Diagnosis not present

## 2018-01-01 DIAGNOSIS — M79672 Pain in left foot: Secondary | ICD-10-CM | POA: Diagnosis not present

## 2018-01-01 DIAGNOSIS — R609 Edema, unspecified: Secondary | ICD-10-CM | POA: Diagnosis not present

## 2018-01-01 DIAGNOSIS — M7989 Other specified soft tissue disorders: Secondary | ICD-10-CM | POA: Diagnosis not present

## 2018-01-07 ENCOUNTER — Ambulatory Visit (INDEPENDENT_AMBULATORY_CARE_PROVIDER_SITE_OTHER): Payer: Medicare Other | Admitting: Podiatry

## 2018-01-07 ENCOUNTER — Encounter: Payer: Self-pay | Admitting: Podiatry

## 2018-01-07 DIAGNOSIS — Z9889 Other specified postprocedural states: Secondary | ICD-10-CM

## 2018-01-07 MED ORDER — OXYCODONE-ACETAMINOPHEN 7.5-325 MG PO TABS: 1.0000 | ORAL_TABLET | Freq: Four times a day (QID) | ORAL | 0 refills | Status: DC | PRN

## 2018-01-07 NOTE — Progress Notes (Signed)
Subjective: 47 y.o. year old female patient presents for follow up on left foot surgery. Gastroc Recession left, 11/22/17,andLapidus fusion 1st MCJ left foot 11/01/17. Patient came in using CAM walker.   HPI: During her post op visit on 12/10/17 it was noted that she had excess edema at proximal 1/2 of left lower limb and advised to to ER  She was diagnosed with DVT and is being treated.  Objective: Dermatologic: Normal findings. Vascular: Positive for forefoot edema left foot. No erythema or painful areas noted. Orthopedic: Left foot pedal pulses are not palpable due to excess edema. Neurologic: All epicritic and tactile sensations grossly intact.  Assessment: Post surgical left foot with normal wound healing. S/P DVT left lower limb.  Treatment: Reviewed findings. Patient is to get Vascular specialist follow up done. Get referral from her local hospital [facility]) where she was treated for DVT. If not successful, patient is to call this office for referral at [facility]. Return in one month. As per request, 5 day supply pain medication prescribed. Ok to wear regular shoes as tolerated.

## 2018-01-07 NOTE — Patient Instructions (Signed)
2 month follow up on left foot surgery. Doing well taking anticoagulant medication and ambulating with CAM walker. May use regular shoe as tolerated. Return in one month.

## 2018-01-08 DIAGNOSIS — Z86718 Personal history of other venous thrombosis and embolism: Secondary | ICD-10-CM | POA: Diagnosis not present

## 2018-01-09 ENCOUNTER — Other Ambulatory Visit: Payer: Self-pay

## 2018-01-14 ENCOUNTER — Other Ambulatory Visit: Payer: Self-pay

## 2018-01-14 NOTE — Patient Outreach (Signed)
[facility]) Care Management  01/14/2018  Diana Henderson 1971-03-25 [id number]   Referral Date: 01/09/18 Referral Source: EMMI prevent Referral Reason: Engagement tool   Outreach Attempt #1 No answer. HIPAA compliant voice message left  Plan: RN CM will send letter and attempt again within 4 business days.   Jone Baseman, RN, MSN [facility] Care Management Care Management Coordinator Direct Line [phone] Toll Free: [phone]  Fax: [phone]

## 2018-01-17 ENCOUNTER — Other Ambulatory Visit: Payer: Self-pay

## 2018-01-17 DIAGNOSIS — M25551 Pain in right hip: Secondary | ICD-10-CM | POA: Diagnosis not present

## 2018-01-17 DIAGNOSIS — M25511 Pain in right shoulder: Secondary | ICD-10-CM | POA: Diagnosis not present

## 2018-01-17 DIAGNOSIS — Z79891 Long term (current) use of opiate analgesic: Secondary | ICD-10-CM | POA: Diagnosis not present

## 2018-01-17 DIAGNOSIS — M79641 Pain in right hand: Secondary | ICD-10-CM | POA: Diagnosis not present

## 2018-01-17 DIAGNOSIS — M79642 Pain in left hand: Secondary | ICD-10-CM | POA: Diagnosis not present

## 2018-01-17 DIAGNOSIS — G894 Chronic pain syndrome: Secondary | ICD-10-CM | POA: Diagnosis not present

## 2018-01-17 DIAGNOSIS — M25512 Pain in left shoulder: Secondary | ICD-10-CM | POA: Diagnosis not present

## 2018-01-17 DIAGNOSIS — G8929 Other chronic pain: Secondary | ICD-10-CM | POA: Diagnosis not present

## 2018-01-17 NOTE — Patient Outreach (Signed)
[facility]) Care Management  01/17/2018  Diana Henderson September 01, 1970 [id number]   Referral Date: 01/09/18 Referral Source: EMMI prevent Referral Reason: Engagement tool   Outreach Attempt #2 No answer. HIPAA compliant voice message left  Plan: RN CM will attempt patient again within 4 business days.    Jone Baseman, RN, MSN [facility] Care Management Coordinator Direct Line [phone] Cell [phone] Toll Free: [phone]  Fax: [phone]

## 2018-01-18 DIAGNOSIS — S82309A Unspecified fracture of lower end of unspecified tibia, initial encounter for closed fracture: Secondary | ICD-10-CM | POA: Diagnosis not present

## 2018-01-20 ENCOUNTER — Other Ambulatory Visit: Payer: Self-pay

## 2018-01-20 NOTE — Patient Outreach (Signed)
[facility]) Care Management  01/20/2018  Diana Henderson 1971/01/13 [id number]   Referral Date:01/09/18 Referral Source:EMMI prevent Referral Reason:Engagement tool   Outreach Attempt#3 No answer. HIPAA compliant voice message left.  Plan: RN CM will wait patient return call.  If not return call will close case.  Jone Baseman, RN, MSN [facility] Management Care Management Coordinator Direct Line [phone] Cell [phone] Toll Free: [phone]  Fax: [phone]

## 2018-01-23 ENCOUNTER — Other Ambulatory Visit: Payer: Self-pay

## 2018-01-23 NOTE — Patient Outreach (Signed)
[place] [facility]) Care Management  01/23/2018  Diana Henderson 06/11/1971 [id number]   Multiple attempts to establish contact with patient without success. No response from letter mailed to patient.   Plan: RN CM will close case at this time.    Jone Baseman, RN, MSN [place] [facility] Care Management Coordinator Direct Line [phone] Cell [phone] Toll Free: [phone]  Fax: [phone]

## 2018-01-29 DIAGNOSIS — G4733 Obstructive sleep apnea (adult) (pediatric): Secondary | ICD-10-CM | POA: Diagnosis not present

## 2018-01-31 DIAGNOSIS — M79641 Pain in right hand: Secondary | ICD-10-CM | POA: Diagnosis not present

## 2018-01-31 DIAGNOSIS — M25512 Pain in left shoulder: Secondary | ICD-10-CM | POA: Diagnosis not present

## 2018-01-31 DIAGNOSIS — G894 Chronic pain syndrome: Secondary | ICD-10-CM | POA: Diagnosis not present

## 2018-01-31 DIAGNOSIS — M545 Low back pain: Secondary | ICD-10-CM | POA: Diagnosis not present

## 2018-01-31 DIAGNOSIS — M79642 Pain in left hand: Secondary | ICD-10-CM | POA: Diagnosis not present

## 2018-01-31 DIAGNOSIS — Z79891 Long term (current) use of opiate analgesic: Secondary | ICD-10-CM | POA: Diagnosis not present

## 2018-01-31 DIAGNOSIS — G8929 Other chronic pain: Secondary | ICD-10-CM | POA: Diagnosis not present

## 2018-01-31 DIAGNOSIS — M25551 Pain in right hip: Secondary | ICD-10-CM | POA: Diagnosis not present

## 2018-02-03 DIAGNOSIS — M542 Cervicalgia: Secondary | ICD-10-CM | POA: Diagnosis not present

## 2018-02-03 DIAGNOSIS — I10 Essential (primary) hypertension: Secondary | ICD-10-CM | POA: Diagnosis not present

## 2018-02-03 DIAGNOSIS — R5381 Other malaise: Secondary | ICD-10-CM | POA: Diagnosis not present

## 2018-02-11 ENCOUNTER — Ambulatory Visit (INDEPENDENT_AMBULATORY_CARE_PROVIDER_SITE_OTHER): Payer: Medicare Other | Admitting: Podiatry

## 2018-02-11 DIAGNOSIS — Z9889 Other specified postprocedural states: Secondary | ICD-10-CM

## 2018-02-11 NOTE — Progress Notes (Signed)
Subjective: 47 y.o. year old female patient presents for follow up on left foot surgery.  Gastroc Recession left, 11/22/17,andLapidus fusion 1st MCJ left foot 11/01/17. Stated that she came to give a good report. Patient is wearing open toed slippers. Still have difficulty wearing lace up shoes. Stated that her swelling on the left foot is down. Back of leg wound has healed using Cocoa butter. She is back in school. Now enrolled in pain clinic.  HPI: During her post op visit on 12/10/17 it was noted that she had excess edema at proximal 1/2 of left lower limb and advised to to ER She was diagnosed with DVT and is being treated.  Objective: Dermatologic: Normal findings. Vascular: Normal DP and PT. No edema or erythema noted. Orthopedic: No abnormal findings other then elevated first ray with surgical hardware in previous x-ray. Neurologic: All epicritic and tactile sensations grossly intact.  Assessment: Post surgical left foot with normal wound healing. S/P DVT left lower limb.   Treatment: Reviewed findings. Continue with current level of activity and increase weight bearing as tolerated.

## 2018-02-11 NOTE — Patient Instructions (Signed)
Follow up on left foot surgery and recovery from DVT. Doing well. Swelling is under control. Pain is managed through clinic. Mobility is good and still improving. Continue current level of activity and increase as tolerated. Return as needed.

## 2018-02-12 DIAGNOSIS — M25672 Stiffness of left ankle, not elsewhere classified: Secondary | ICD-10-CM | POA: Diagnosis not present

## 2018-02-12 DIAGNOSIS — M2560 Stiffness of unspecified joint, not elsewhere classified: Secondary | ICD-10-CM | POA: Diagnosis not present

## 2018-02-12 DIAGNOSIS — M6281 Muscle weakness (generalized): Secondary | ICD-10-CM | POA: Diagnosis not present

## 2018-02-12 DIAGNOSIS — M545 Low back pain: Secondary | ICD-10-CM | POA: Diagnosis not present

## 2018-02-12 DIAGNOSIS — R2689 Other abnormalities of gait and mobility: Secondary | ICD-10-CM | POA: Diagnosis not present

## 2018-02-12 DIAGNOSIS — M542 Cervicalgia: Secondary | ICD-10-CM | POA: Diagnosis not present

## 2018-02-12 DIAGNOSIS — M25572 Pain in left ankle and joints of left foot: Secondary | ICD-10-CM | POA: Diagnosis not present

## 2018-02-12 DIAGNOSIS — R5381 Other malaise: Secondary | ICD-10-CM | POA: Diagnosis not present

## 2018-02-13 ENCOUNTER — Encounter: Payer: Self-pay | Admitting: Podiatry

## 2018-02-17 IMAGING — DX DG CHEST 2V
2 series · 2 of 2 positions shown · non-contrast
Comparison: 08/17/2015

CLINICAL DATA: Productive cough for 1 week. Pain across the top of
the chest for 3 days. History of hypertension and diabetes. Former
smoker.

EXAM:
CHEST  2 VIEW

[w chest pa]
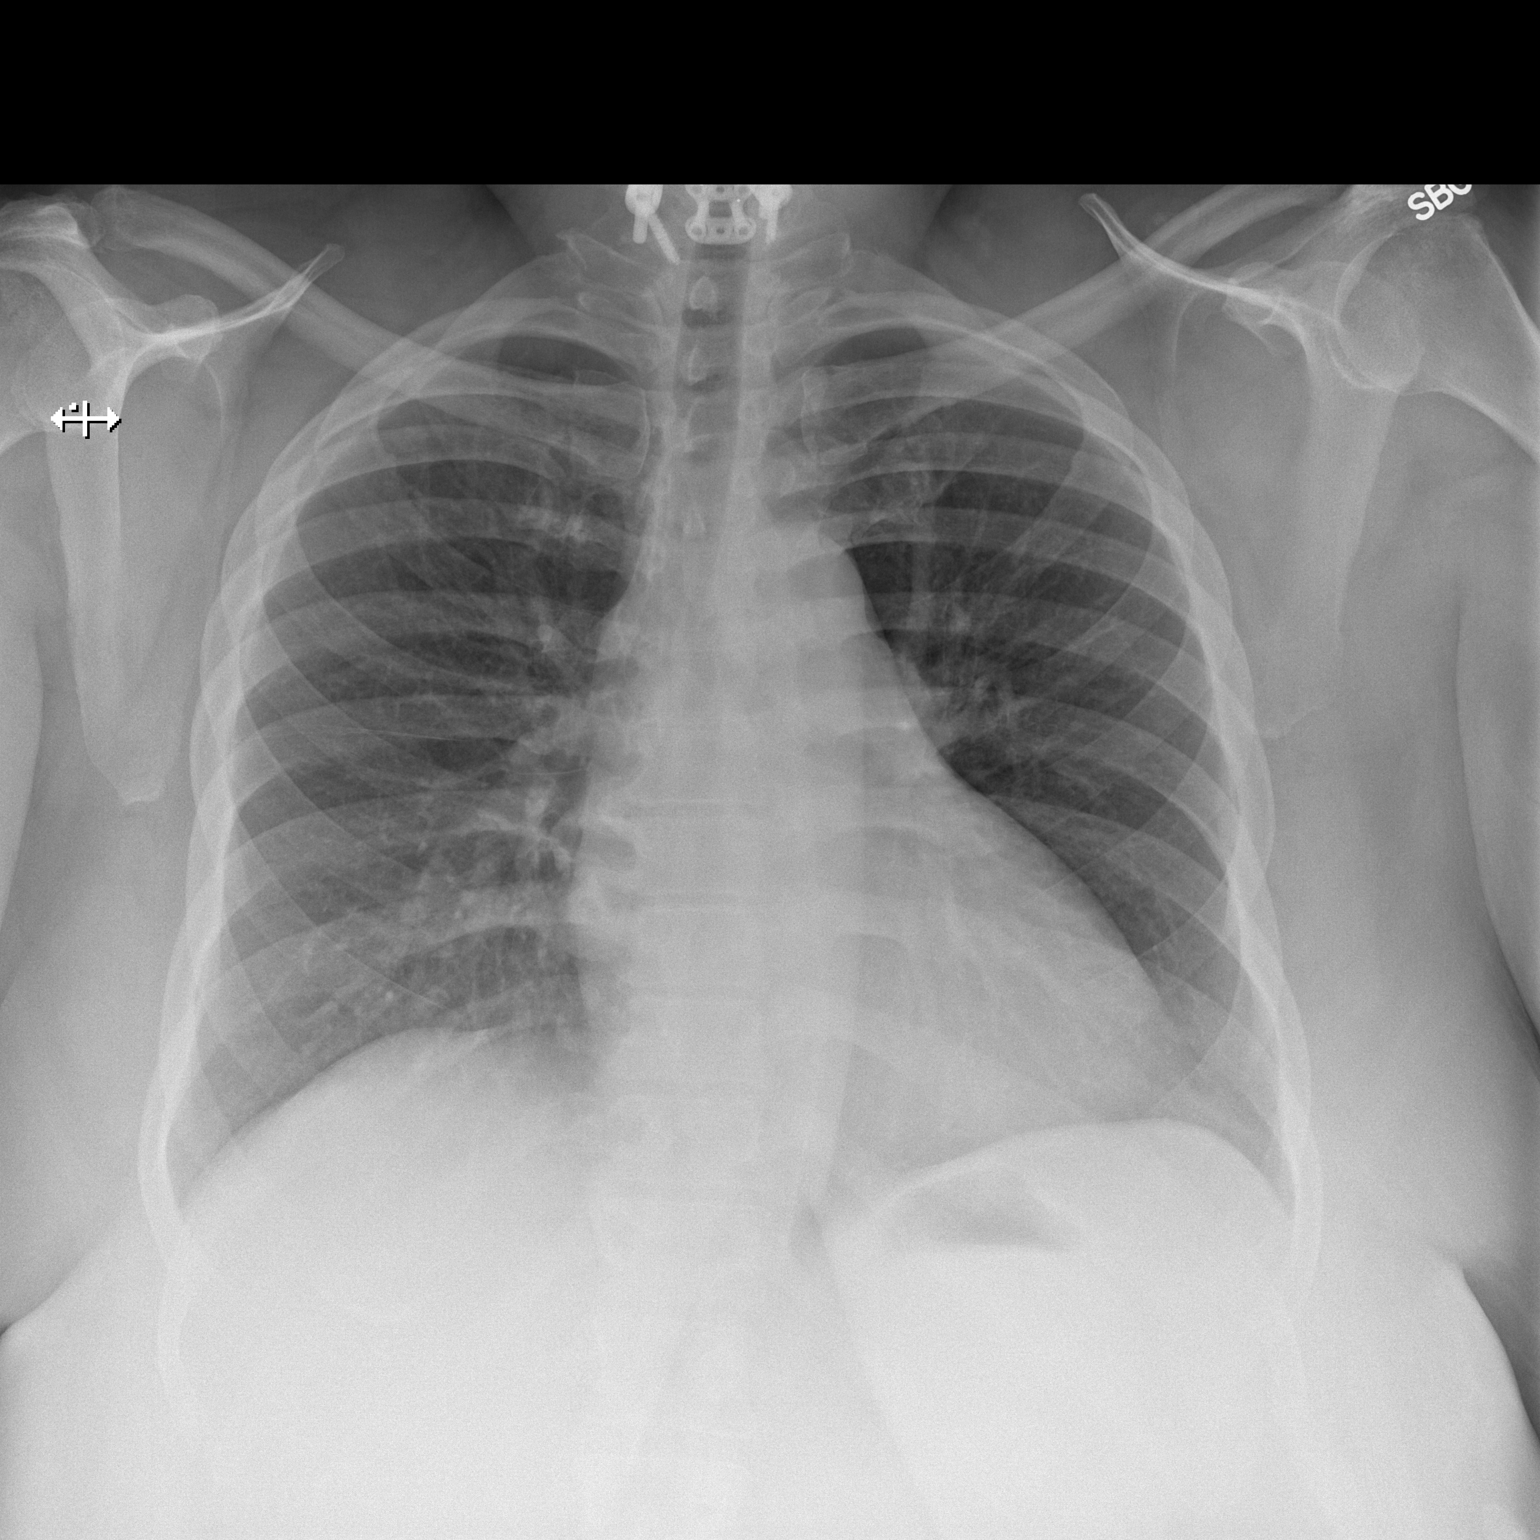

[w chest lat]
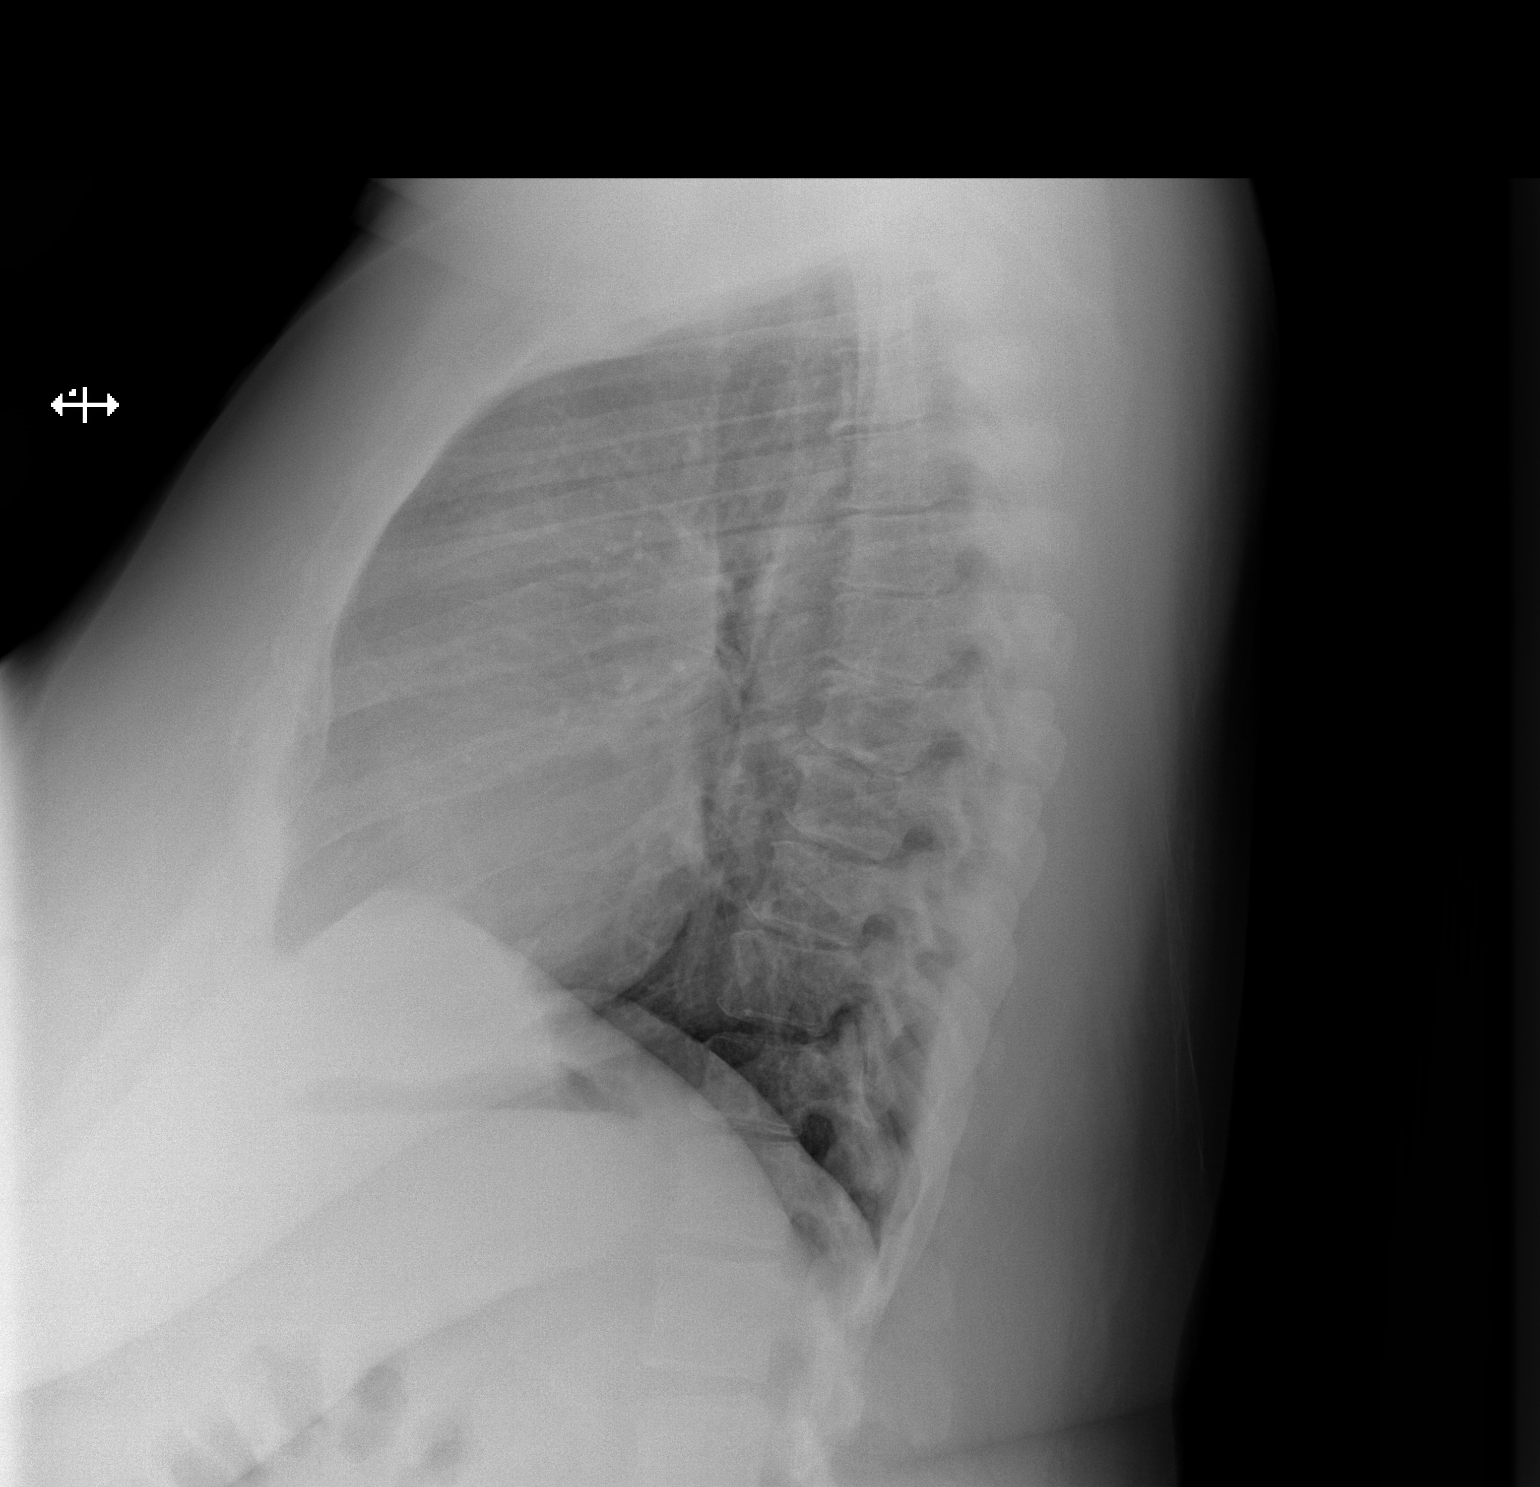

[2 of 2 positions shown; findings below may reference images not displayed]

FINDINGS: Normal heart size and pulmonary vascularity. No focal airspace
disease or consolidation in the lungs. No blunting of costophrenic
angles. No pneumothorax. Mediastinal contours appear intact.
Postoperative changes in the cervical spine. Degenerative changes in
thoracic spine and shoulders.
IMPRESSION: No active cardiopulmonary disease.

## 2018-02-18 DIAGNOSIS — S82309A Unspecified fracture of lower end of unspecified tibia, initial encounter for closed fracture: Secondary | ICD-10-CM | POA: Diagnosis not present

## 2018-02-18 DIAGNOSIS — J45901 Unspecified asthma with (acute) exacerbation: Secondary | ICD-10-CM | POA: Diagnosis not present

## 2018-02-19 DIAGNOSIS — M6281 Muscle weakness (generalized): Secondary | ICD-10-CM | POA: Diagnosis not present

## 2018-02-19 DIAGNOSIS — M25672 Stiffness of left ankle, not elsewhere classified: Secondary | ICD-10-CM | POA: Diagnosis not present

## 2018-02-19 DIAGNOSIS — R2689 Other abnormalities of gait and mobility: Secondary | ICD-10-CM | POA: Diagnosis not present

## 2018-02-19 DIAGNOSIS — R5381 Other malaise: Secondary | ICD-10-CM | POA: Diagnosis not present

## 2018-02-19 DIAGNOSIS — M2560 Stiffness of unspecified joint, not elsewhere classified: Secondary | ICD-10-CM | POA: Diagnosis not present

## 2018-02-19 DIAGNOSIS — M25572 Pain in left ankle and joints of left foot: Secondary | ICD-10-CM | POA: Diagnosis not present

## 2018-02-19 DIAGNOSIS — M545 Low back pain: Secondary | ICD-10-CM | POA: Diagnosis not present

## 2018-02-19 DIAGNOSIS — M542 Cervicalgia: Secondary | ICD-10-CM | POA: Diagnosis not present

## 2018-02-20 DIAGNOSIS — E119 Type 2 diabetes mellitus without complications: Secondary | ICD-10-CM | POA: Diagnosis not present

## 2018-02-20 DIAGNOSIS — I10 Essential (primary) hypertension: Secondary | ICD-10-CM | POA: Diagnosis not present

## 2018-02-20 DIAGNOSIS — Z0000 Encounter for general adult medical examination without abnormal findings: Secondary | ICD-10-CM | POA: Diagnosis not present

## 2018-02-21 ENCOUNTER — Telehealth: Payer: Self-pay | Admitting: *Deleted

## 2018-02-21 DIAGNOSIS — M79672 Pain in left foot: Secondary | ICD-10-CM

## 2018-02-21 DIAGNOSIS — M79671 Pain in right foot: Secondary | ICD-10-CM

## 2018-02-21 DIAGNOSIS — M722 Plantar fascial fibromatosis: Secondary | ICD-10-CM

## 2018-02-21 DIAGNOSIS — R262 Difficulty in walking, not elsewhere classified: Secondary | ICD-10-CM

## 2018-02-21 DIAGNOSIS — M659 Synovitis and tenosynovitis, unspecified: Secondary | ICD-10-CM

## 2018-02-21 NOTE — Telephone Encounter (Signed)
Patient requests a rx for a rolater walker  To be sent to [facility]. [phone])

## 2018-02-24 DIAGNOSIS — R2689 Other abnormalities of gait and mobility: Secondary | ICD-10-CM | POA: Diagnosis not present

## 2018-02-24 DIAGNOSIS — R5381 Other malaise: Secondary | ICD-10-CM | POA: Diagnosis not present

## 2018-02-24 DIAGNOSIS — M25572 Pain in left ankle and joints of left foot: Secondary | ICD-10-CM | POA: Diagnosis not present

## 2018-02-24 DIAGNOSIS — M6281 Muscle weakness (generalized): Secondary | ICD-10-CM | POA: Diagnosis not present

## 2018-02-24 DIAGNOSIS — M2560 Stiffness of unspecified joint, not elsewhere classified: Secondary | ICD-10-CM | POA: Diagnosis not present

## 2018-02-24 DIAGNOSIS — M542 Cervicalgia: Secondary | ICD-10-CM | POA: Diagnosis not present

## 2018-02-24 DIAGNOSIS — M25672 Stiffness of left ankle, not elsewhere classified: Secondary | ICD-10-CM | POA: Diagnosis not present

## 2018-02-24 DIAGNOSIS — M545 Low back pain: Secondary | ICD-10-CM | POA: Diagnosis not present

## 2018-02-26 DIAGNOSIS — M6281 Muscle weakness (generalized): Secondary | ICD-10-CM | POA: Diagnosis not present

## 2018-02-26 DIAGNOSIS — M2560 Stiffness of unspecified joint, not elsewhere classified: Secondary | ICD-10-CM | POA: Diagnosis not present

## 2018-02-26 DIAGNOSIS — M25672 Stiffness of left ankle, not elsewhere classified: Secondary | ICD-10-CM | POA: Diagnosis not present

## 2018-02-26 DIAGNOSIS — M542 Cervicalgia: Secondary | ICD-10-CM | POA: Diagnosis not present

## 2018-02-26 DIAGNOSIS — M25572 Pain in left ankle and joints of left foot: Secondary | ICD-10-CM | POA: Diagnosis not present

## 2018-02-26 DIAGNOSIS — R5381 Other malaise: Secondary | ICD-10-CM | POA: Diagnosis not present

## 2018-02-26 DIAGNOSIS — R2689 Other abnormalities of gait and mobility: Secondary | ICD-10-CM | POA: Diagnosis not present

## 2018-02-26 DIAGNOSIS — M545 Low back pain: Secondary | ICD-10-CM | POA: Diagnosis not present

## 2018-02-26 NOTE — Telephone Encounter (Signed)
Rx for rollator walker placed per verbal order from provider.

## 2018-02-28 DIAGNOSIS — M25551 Pain in right hip: Secondary | ICD-10-CM | POA: Diagnosis not present

## 2018-02-28 DIAGNOSIS — M79642 Pain in left hand: Secondary | ICD-10-CM | POA: Diagnosis not present

## 2018-02-28 DIAGNOSIS — G894 Chronic pain syndrome: Secondary | ICD-10-CM | POA: Diagnosis not present

## 2018-02-28 DIAGNOSIS — Z79891 Long term (current) use of opiate analgesic: Secondary | ICD-10-CM | POA: Diagnosis not present

## 2018-02-28 DIAGNOSIS — M79641 Pain in right hand: Secondary | ICD-10-CM | POA: Diagnosis not present

## 2018-02-28 DIAGNOSIS — M25512 Pain in left shoulder: Secondary | ICD-10-CM | POA: Diagnosis not present

## 2018-02-28 DIAGNOSIS — M545 Low back pain: Secondary | ICD-10-CM | POA: Diagnosis not present

## 2018-02-28 DIAGNOSIS — G8929 Other chronic pain: Secondary | ICD-10-CM | POA: Diagnosis not present

## 2018-03-01 DIAGNOSIS — G4733 Obstructive sleep apnea (adult) (pediatric): Secondary | ICD-10-CM | POA: Diagnosis not present

## 2018-03-03 DIAGNOSIS — M25672 Stiffness of left ankle, not elsewhere classified: Secondary | ICD-10-CM | POA: Diagnosis not present

## 2018-03-03 DIAGNOSIS — R2689 Other abnormalities of gait and mobility: Secondary | ICD-10-CM | POA: Diagnosis not present

## 2018-03-03 DIAGNOSIS — M2560 Stiffness of unspecified joint, not elsewhere classified: Secondary | ICD-10-CM | POA: Diagnosis not present

## 2018-03-03 DIAGNOSIS — M545 Low back pain: Secondary | ICD-10-CM | POA: Diagnosis not present

## 2018-03-03 DIAGNOSIS — M542 Cervicalgia: Secondary | ICD-10-CM | POA: Diagnosis not present

## 2018-03-03 DIAGNOSIS — M25572 Pain in left ankle and joints of left foot: Secondary | ICD-10-CM | POA: Diagnosis not present

## 2018-03-03 DIAGNOSIS — M6281 Muscle weakness (generalized): Secondary | ICD-10-CM | POA: Diagnosis not present

## 2018-03-03 DIAGNOSIS — R5381 Other malaise: Secondary | ICD-10-CM | POA: Diagnosis not present

## 2018-03-10 DIAGNOSIS — R2689 Other abnormalities of gait and mobility: Secondary | ICD-10-CM | POA: Diagnosis not present

## 2018-03-10 DIAGNOSIS — M25672 Stiffness of left ankle, not elsewhere classified: Secondary | ICD-10-CM | POA: Diagnosis not present

## 2018-03-10 DIAGNOSIS — M25572 Pain in left ankle and joints of left foot: Secondary | ICD-10-CM | POA: Diagnosis not present

## 2018-03-10 DIAGNOSIS — M542 Cervicalgia: Secondary | ICD-10-CM | POA: Diagnosis not present

## 2018-03-10 DIAGNOSIS — M545 Low back pain: Secondary | ICD-10-CM | POA: Diagnosis not present

## 2018-03-10 DIAGNOSIS — M2560 Stiffness of unspecified joint, not elsewhere classified: Secondary | ICD-10-CM | POA: Diagnosis not present

## 2018-03-10 DIAGNOSIS — M6281 Muscle weakness (generalized): Secondary | ICD-10-CM | POA: Diagnosis not present

## 2018-03-10 DIAGNOSIS — R5381 Other malaise: Secondary | ICD-10-CM | POA: Diagnosis not present

## 2018-03-12 DIAGNOSIS — M542 Cervicalgia: Secondary | ICD-10-CM | POA: Diagnosis not present

## 2018-03-12 DIAGNOSIS — M545 Low back pain: Secondary | ICD-10-CM | POA: Diagnosis not present

## 2018-03-12 DIAGNOSIS — M25572 Pain in left ankle and joints of left foot: Secondary | ICD-10-CM | POA: Diagnosis not present

## 2018-03-12 DIAGNOSIS — R2689 Other abnormalities of gait and mobility: Secondary | ICD-10-CM | POA: Diagnosis not present

## 2018-03-12 DIAGNOSIS — M2560 Stiffness of unspecified joint, not elsewhere classified: Secondary | ICD-10-CM | POA: Diagnosis not present

## 2018-03-12 DIAGNOSIS — M6281 Muscle weakness (generalized): Secondary | ICD-10-CM | POA: Diagnosis not present

## 2018-03-12 DIAGNOSIS — M25672 Stiffness of left ankle, not elsewhere classified: Secondary | ICD-10-CM | POA: Diagnosis not present

## 2018-03-12 DIAGNOSIS — R5381 Other malaise: Secondary | ICD-10-CM | POA: Diagnosis not present

## 2018-03-12 DIAGNOSIS — G4733 Obstructive sleep apnea (adult) (pediatric): Secondary | ICD-10-CM | POA: Diagnosis not present

## 2018-03-14 DIAGNOSIS — G4733 Obstructive sleep apnea (adult) (pediatric): Secondary | ICD-10-CM | POA: Diagnosis not present

## 2018-03-14 DIAGNOSIS — S335XXA Sprain of ligaments of lumbar spine, initial encounter: Secondary | ICD-10-CM | POA: Diagnosis not present

## 2018-03-14 DIAGNOSIS — Z7901 Long term (current) use of anticoagulants: Secondary | ICD-10-CM | POA: Diagnosis not present

## 2018-03-14 DIAGNOSIS — Z86711 Personal history of pulmonary embolism: Secondary | ICD-10-CM | POA: Diagnosis not present

## 2018-03-14 DIAGNOSIS — M545 Low back pain: Secondary | ICD-10-CM | POA: Diagnosis not present

## 2018-03-14 DIAGNOSIS — E119 Type 2 diabetes mellitus without complications: Secondary | ICD-10-CM | POA: Diagnosis not present

## 2018-03-14 DIAGNOSIS — M25562 Pain in left knee: Secondary | ICD-10-CM | POA: Diagnosis not present

## 2018-03-17 DIAGNOSIS — R5381 Other malaise: Secondary | ICD-10-CM | POA: Diagnosis not present

## 2018-03-17 DIAGNOSIS — M542 Cervicalgia: Secondary | ICD-10-CM | POA: Diagnosis not present

## 2018-03-17 DIAGNOSIS — R2689 Other abnormalities of gait and mobility: Secondary | ICD-10-CM | POA: Diagnosis not present

## 2018-03-17 DIAGNOSIS — M6281 Muscle weakness (generalized): Secondary | ICD-10-CM | POA: Diagnosis not present

## 2018-03-17 DIAGNOSIS — M25672 Stiffness of left ankle, not elsewhere classified: Secondary | ICD-10-CM | POA: Diagnosis not present

## 2018-03-17 DIAGNOSIS — M25572 Pain in left ankle and joints of left foot: Secondary | ICD-10-CM | POA: Diagnosis not present

## 2018-03-17 DIAGNOSIS — M2560 Stiffness of unspecified joint, not elsewhere classified: Secondary | ICD-10-CM | POA: Diagnosis not present

## 2018-03-17 DIAGNOSIS — M545 Low back pain: Secondary | ICD-10-CM | POA: Diagnosis not present

## 2018-03-18 DIAGNOSIS — E1042 Type 1 diabetes mellitus with diabetic polyneuropathy: Secondary | ICD-10-CM | POA: Diagnosis not present

## 2018-03-18 DIAGNOSIS — G990 Autonomic neuropathy in diseases classified elsewhere: Secondary | ICD-10-CM | POA: Diagnosis not present

## 2018-03-18 DIAGNOSIS — G479 Sleep disorder, unspecified: Secondary | ICD-10-CM | POA: Diagnosis not present

## 2018-03-18 DIAGNOSIS — I10 Essential (primary) hypertension: Secondary | ICD-10-CM | POA: Diagnosis not present

## 2018-03-18 DIAGNOSIS — M25561 Pain in right knee: Secondary | ICD-10-CM | POA: Diagnosis not present

## 2018-03-18 DIAGNOSIS — M25562 Pain in left knee: Secondary | ICD-10-CM | POA: Diagnosis not present

## 2018-03-19 DIAGNOSIS — M25572 Pain in left ankle and joints of left foot: Secondary | ICD-10-CM | POA: Diagnosis not present

## 2018-03-19 DIAGNOSIS — R5381 Other malaise: Secondary | ICD-10-CM | POA: Diagnosis not present

## 2018-03-19 DIAGNOSIS — M545 Low back pain: Secondary | ICD-10-CM | POA: Diagnosis not present

## 2018-03-19 DIAGNOSIS — M2560 Stiffness of unspecified joint, not elsewhere classified: Secondary | ICD-10-CM | POA: Diagnosis not present

## 2018-03-19 DIAGNOSIS — M25672 Stiffness of left ankle, not elsewhere classified: Secondary | ICD-10-CM | POA: Diagnosis not present

## 2018-03-19 DIAGNOSIS — M542 Cervicalgia: Secondary | ICD-10-CM | POA: Diagnosis not present

## 2018-03-19 DIAGNOSIS — M6281 Muscle weakness (generalized): Secondary | ICD-10-CM | POA: Diagnosis not present

## 2018-03-19 DIAGNOSIS — R2689 Other abnormalities of gait and mobility: Secondary | ICD-10-CM | POA: Diagnosis not present

## 2018-03-20 DIAGNOSIS — S82309A Unspecified fracture of lower end of unspecified tibia, initial encounter for closed fracture: Secondary | ICD-10-CM | POA: Diagnosis not present

## 2018-03-24 DIAGNOSIS — R5381 Other malaise: Secondary | ICD-10-CM | POA: Diagnosis not present

## 2018-03-24 DIAGNOSIS — M2560 Stiffness of unspecified joint, not elsewhere classified: Secondary | ICD-10-CM | POA: Diagnosis not present

## 2018-03-24 DIAGNOSIS — M6281 Muscle weakness (generalized): Secondary | ICD-10-CM | POA: Diagnosis not present

## 2018-03-24 DIAGNOSIS — M25572 Pain in left ankle and joints of left foot: Secondary | ICD-10-CM | POA: Diagnosis not present

## 2018-03-24 DIAGNOSIS — R2689 Other abnormalities of gait and mobility: Secondary | ICD-10-CM | POA: Diagnosis not present

## 2018-03-24 DIAGNOSIS — M542 Cervicalgia: Secondary | ICD-10-CM | POA: Diagnosis not present

## 2018-03-24 DIAGNOSIS — M25672 Stiffness of left ankle, not elsewhere classified: Secondary | ICD-10-CM | POA: Diagnosis not present

## 2018-03-24 DIAGNOSIS — M545 Low back pain: Secondary | ICD-10-CM | POA: Diagnosis not present

## 2018-03-25 DIAGNOSIS — G479 Sleep disorder, unspecified: Secondary | ICD-10-CM | POA: Diagnosis not present

## 2018-03-25 DIAGNOSIS — I10 Essential (primary) hypertension: Secondary | ICD-10-CM | POA: Diagnosis not present

## 2018-03-25 DIAGNOSIS — E119 Type 2 diabetes mellitus without complications: Secondary | ICD-10-CM | POA: Diagnosis not present

## 2018-03-26 DIAGNOSIS — R2689 Other abnormalities of gait and mobility: Secondary | ICD-10-CM | POA: Diagnosis not present

## 2018-03-26 DIAGNOSIS — R5381 Other malaise: Secondary | ICD-10-CM | POA: Diagnosis not present

## 2018-03-26 DIAGNOSIS — M2560 Stiffness of unspecified joint, not elsewhere classified: Secondary | ICD-10-CM | POA: Diagnosis not present

## 2018-03-26 DIAGNOSIS — M545 Low back pain: Secondary | ICD-10-CM | POA: Diagnosis not present

## 2018-03-26 DIAGNOSIS — M6281 Muscle weakness (generalized): Secondary | ICD-10-CM | POA: Diagnosis not present

## 2018-03-26 DIAGNOSIS — M25572 Pain in left ankle and joints of left foot: Secondary | ICD-10-CM | POA: Diagnosis not present

## 2018-03-26 DIAGNOSIS — M25672 Stiffness of left ankle, not elsewhere classified: Secondary | ICD-10-CM | POA: Diagnosis not present

## 2018-03-26 DIAGNOSIS — M542 Cervicalgia: Secondary | ICD-10-CM | POA: Diagnosis not present

## 2018-03-28 DIAGNOSIS — I119 Hypertensive heart disease without heart failure: Secondary | ICD-10-CM | POA: Diagnosis not present

## 2018-03-28 DIAGNOSIS — G629 Polyneuropathy, unspecified: Secondary | ICD-10-CM | POA: Diagnosis not present

## 2018-03-28 DIAGNOSIS — I10 Essential (primary) hypertension: Secondary | ICD-10-CM | POA: Diagnosis not present

## 2018-03-28 DIAGNOSIS — E119 Type 2 diabetes mellitus without complications: Secondary | ICD-10-CM | POA: Diagnosis not present

## 2018-03-28 DIAGNOSIS — J45909 Unspecified asthma, uncomplicated: Secondary | ICD-10-CM | POA: Diagnosis not present

## 2018-03-28 DIAGNOSIS — Z23 Encounter for immunization: Secondary | ICD-10-CM | POA: Diagnosis not present

## 2018-03-31 DIAGNOSIS — G4733 Obstructive sleep apnea (adult) (pediatric): Secondary | ICD-10-CM | POA: Diagnosis not present

## 2018-04-02 DIAGNOSIS — M25551 Pain in right hip: Secondary | ICD-10-CM | POA: Diagnosis not present

## 2018-04-02 DIAGNOSIS — M79641 Pain in right hand: Secondary | ICD-10-CM | POA: Diagnosis not present

## 2018-04-02 DIAGNOSIS — G894 Chronic pain syndrome: Secondary | ICD-10-CM | POA: Diagnosis not present

## 2018-04-02 DIAGNOSIS — Z79891 Long term (current) use of opiate analgesic: Secondary | ICD-10-CM | POA: Diagnosis not present

## 2018-04-02 DIAGNOSIS — M79642 Pain in left hand: Secondary | ICD-10-CM | POA: Diagnosis not present

## 2018-04-02 DIAGNOSIS — G8929 Other chronic pain: Secondary | ICD-10-CM | POA: Diagnosis not present

## 2018-04-02 DIAGNOSIS — M545 Low back pain: Secondary | ICD-10-CM | POA: Diagnosis not present

## 2018-04-02 DIAGNOSIS — M25512 Pain in left shoulder: Secondary | ICD-10-CM | POA: Diagnosis not present

## 2018-04-07 DIAGNOSIS — M545 Low back pain: Secondary | ICD-10-CM | POA: Diagnosis not present

## 2018-04-07 DIAGNOSIS — M6281 Muscle weakness (generalized): Secondary | ICD-10-CM | POA: Diagnosis not present

## 2018-04-07 DIAGNOSIS — R2689 Other abnormalities of gait and mobility: Secondary | ICD-10-CM | POA: Diagnosis not present

## 2018-04-07 DIAGNOSIS — M542 Cervicalgia: Secondary | ICD-10-CM | POA: Diagnosis not present

## 2018-04-07 DIAGNOSIS — M2560 Stiffness of unspecified joint, not elsewhere classified: Secondary | ICD-10-CM | POA: Diagnosis not present

## 2018-04-07 DIAGNOSIS — R5381 Other malaise: Secondary | ICD-10-CM | POA: Diagnosis not present

## 2018-04-07 DIAGNOSIS — M25672 Stiffness of left ankle, not elsewhere classified: Secondary | ICD-10-CM | POA: Diagnosis not present

## 2018-04-07 DIAGNOSIS — M25572 Pain in left ankle and joints of left foot: Secondary | ICD-10-CM | POA: Diagnosis not present

## 2018-04-09 DIAGNOSIS — R5381 Other malaise: Secondary | ICD-10-CM | POA: Diagnosis not present

## 2018-04-09 DIAGNOSIS — M25572 Pain in left ankle and joints of left foot: Secondary | ICD-10-CM | POA: Diagnosis not present

## 2018-04-09 DIAGNOSIS — M2560 Stiffness of unspecified joint, not elsewhere classified: Secondary | ICD-10-CM | POA: Diagnosis not present

## 2018-04-09 DIAGNOSIS — R2689 Other abnormalities of gait and mobility: Secondary | ICD-10-CM | POA: Diagnosis not present

## 2018-04-09 DIAGNOSIS — M542 Cervicalgia: Secondary | ICD-10-CM | POA: Diagnosis not present

## 2018-04-09 DIAGNOSIS — M545 Low back pain: Secondary | ICD-10-CM | POA: Diagnosis not present

## 2018-04-09 DIAGNOSIS — M25672 Stiffness of left ankle, not elsewhere classified: Secondary | ICD-10-CM | POA: Diagnosis not present

## 2018-04-09 DIAGNOSIS — M6281 Muscle weakness (generalized): Secondary | ICD-10-CM | POA: Diagnosis not present

## 2018-04-14 DIAGNOSIS — M6281 Muscle weakness (generalized): Secondary | ICD-10-CM | POA: Diagnosis not present

## 2018-04-14 DIAGNOSIS — R5381 Other malaise: Secondary | ICD-10-CM | POA: Diagnosis not present

## 2018-04-14 DIAGNOSIS — R2689 Other abnormalities of gait and mobility: Secondary | ICD-10-CM | POA: Diagnosis not present

## 2018-04-14 DIAGNOSIS — M2560 Stiffness of unspecified joint, not elsewhere classified: Secondary | ICD-10-CM | POA: Diagnosis not present

## 2018-04-14 DIAGNOSIS — M545 Low back pain: Secondary | ICD-10-CM | POA: Diagnosis not present

## 2018-04-14 DIAGNOSIS — M25672 Stiffness of left ankle, not elsewhere classified: Secondary | ICD-10-CM | POA: Diagnosis not present

## 2018-04-14 DIAGNOSIS — M542 Cervicalgia: Secondary | ICD-10-CM | POA: Diagnosis not present

## 2018-04-14 DIAGNOSIS — M25572 Pain in left ankle and joints of left foot: Secondary | ICD-10-CM | POA: Diagnosis not present

## 2018-04-16 DIAGNOSIS — M545 Low back pain: Secondary | ICD-10-CM | POA: Diagnosis not present

## 2018-04-16 DIAGNOSIS — M6281 Muscle weakness (generalized): Secondary | ICD-10-CM | POA: Diagnosis not present

## 2018-04-16 DIAGNOSIS — M25572 Pain in left ankle and joints of left foot: Secondary | ICD-10-CM | POA: Diagnosis not present

## 2018-04-16 DIAGNOSIS — R5381 Other malaise: Secondary | ICD-10-CM | POA: Diagnosis not present

## 2018-04-16 DIAGNOSIS — M2560 Stiffness of unspecified joint, not elsewhere classified: Secondary | ICD-10-CM | POA: Diagnosis not present

## 2018-04-16 DIAGNOSIS — M25672 Stiffness of left ankle, not elsewhere classified: Secondary | ICD-10-CM | POA: Diagnosis not present

## 2018-04-16 DIAGNOSIS — R2689 Other abnormalities of gait and mobility: Secondary | ICD-10-CM | POA: Diagnosis not present

## 2018-04-16 DIAGNOSIS — M542 Cervicalgia: Secondary | ICD-10-CM | POA: Diagnosis not present

## 2018-04-20 DIAGNOSIS — S82309A Unspecified fracture of lower end of unspecified tibia, initial encounter for closed fracture: Secondary | ICD-10-CM | POA: Diagnosis not present

## 2018-04-21 DIAGNOSIS — M2560 Stiffness of unspecified joint, not elsewhere classified: Secondary | ICD-10-CM | POA: Diagnosis not present

## 2018-04-21 DIAGNOSIS — M545 Low back pain: Secondary | ICD-10-CM | POA: Diagnosis not present

## 2018-04-21 DIAGNOSIS — M542 Cervicalgia: Secondary | ICD-10-CM | POA: Diagnosis not present

## 2018-04-21 DIAGNOSIS — R5381 Other malaise: Secondary | ICD-10-CM | POA: Diagnosis not present

## 2018-04-21 DIAGNOSIS — M25572 Pain in left ankle and joints of left foot: Secondary | ICD-10-CM | POA: Diagnosis not present

## 2018-04-21 DIAGNOSIS — M6281 Muscle weakness (generalized): Secondary | ICD-10-CM | POA: Diagnosis not present

## 2018-04-21 DIAGNOSIS — R2689 Other abnormalities of gait and mobility: Secondary | ICD-10-CM | POA: Diagnosis not present

## 2018-04-21 DIAGNOSIS — M25672 Stiffness of left ankle, not elsewhere classified: Secondary | ICD-10-CM | POA: Diagnosis not present

## 2018-04-28 DIAGNOSIS — R5381 Other malaise: Secondary | ICD-10-CM | POA: Diagnosis not present

## 2018-04-28 DIAGNOSIS — M6281 Muscle weakness (generalized): Secondary | ICD-10-CM | POA: Diagnosis not present

## 2018-04-28 DIAGNOSIS — M25672 Stiffness of left ankle, not elsewhere classified: Secondary | ICD-10-CM | POA: Diagnosis not present

## 2018-04-28 DIAGNOSIS — M2560 Stiffness of unspecified joint, not elsewhere classified: Secondary | ICD-10-CM | POA: Diagnosis not present

## 2018-04-28 DIAGNOSIS — M25572 Pain in left ankle and joints of left foot: Secondary | ICD-10-CM | POA: Diagnosis not present

## 2018-04-28 DIAGNOSIS — R2689 Other abnormalities of gait and mobility: Secondary | ICD-10-CM | POA: Diagnosis not present

## 2018-04-28 DIAGNOSIS — M545 Low back pain: Secondary | ICD-10-CM | POA: Diagnosis not present

## 2018-04-28 DIAGNOSIS — M542 Cervicalgia: Secondary | ICD-10-CM | POA: Diagnosis not present

## 2018-04-30 DIAGNOSIS — M25512 Pain in left shoulder: Secondary | ICD-10-CM | POA: Diagnosis not present

## 2018-04-30 DIAGNOSIS — M25562 Pain in left knee: Secondary | ICD-10-CM | POA: Diagnosis not present

## 2018-04-30 DIAGNOSIS — G8929 Other chronic pain: Secondary | ICD-10-CM | POA: Diagnosis not present

## 2018-04-30 DIAGNOSIS — M545 Low back pain: Secondary | ICD-10-CM | POA: Diagnosis not present

## 2018-04-30 DIAGNOSIS — M542 Cervicalgia: Secondary | ICD-10-CM | POA: Diagnosis not present

## 2018-04-30 DIAGNOSIS — G894 Chronic pain syndrome: Secondary | ICD-10-CM | POA: Diagnosis not present

## 2018-04-30 DIAGNOSIS — M25561 Pain in right knee: Secondary | ICD-10-CM | POA: Diagnosis not present

## 2018-04-30 DIAGNOSIS — M79671 Pain in right foot: Secondary | ICD-10-CM | POA: Diagnosis not present

## 2018-04-30 DIAGNOSIS — Z79891 Long term (current) use of opiate analgesic: Secondary | ICD-10-CM | POA: Diagnosis not present

## 2018-05-05 DIAGNOSIS — J069 Acute upper respiratory infection, unspecified: Secondary | ICD-10-CM | POA: Diagnosis not present

## 2018-05-05 DIAGNOSIS — R05 Cough: Secondary | ICD-10-CM | POA: Diagnosis not present

## 2018-05-05 DIAGNOSIS — E1165 Type 2 diabetes mellitus with hyperglycemia: Secondary | ICD-10-CM | POA: Diagnosis not present

## 2018-05-05 DIAGNOSIS — Z87891 Personal history of nicotine dependence: Secondary | ICD-10-CM | POA: Diagnosis not present

## 2018-05-05 DIAGNOSIS — B9689 Other specified bacterial agents as the cause of diseases classified elsewhere: Secondary | ICD-10-CM | POA: Diagnosis not present

## 2018-05-05 DIAGNOSIS — R531 Weakness: Secondary | ICD-10-CM | POA: Diagnosis not present

## 2018-05-06 DIAGNOSIS — R05 Cough: Secondary | ICD-10-CM | POA: Diagnosis not present

## 2018-05-20 DIAGNOSIS — S82309A Unspecified fracture of lower end of unspecified tibia, initial encounter for closed fracture: Secondary | ICD-10-CM | POA: Diagnosis not present

## 2018-05-28 DIAGNOSIS — M545 Low back pain: Secondary | ICD-10-CM | POA: Diagnosis not present

## 2018-05-28 DIAGNOSIS — M79641 Pain in right hand: Secondary | ICD-10-CM | POA: Diagnosis not present

## 2018-05-28 DIAGNOSIS — M79642 Pain in left hand: Secondary | ICD-10-CM | POA: Diagnosis not present

## 2018-05-28 DIAGNOSIS — M25561 Pain in right knee: Secondary | ICD-10-CM | POA: Diagnosis not present

## 2018-05-28 DIAGNOSIS — Z79891 Long term (current) use of opiate analgesic: Secondary | ICD-10-CM | POA: Diagnosis not present

## 2018-05-28 DIAGNOSIS — M542 Cervicalgia: Secondary | ICD-10-CM | POA: Diagnosis not present

## 2018-05-28 DIAGNOSIS — M25512 Pain in left shoulder: Secondary | ICD-10-CM | POA: Diagnosis not present

## 2018-05-28 DIAGNOSIS — G894 Chronic pain syndrome: Secondary | ICD-10-CM | POA: Diagnosis not present

## 2018-05-28 DIAGNOSIS — M25562 Pain in left knee: Secondary | ICD-10-CM | POA: Diagnosis not present

## 2018-05-28 DIAGNOSIS — M25511 Pain in right shoulder: Secondary | ICD-10-CM | POA: Diagnosis not present

## 2018-05-28 DIAGNOSIS — G8929 Other chronic pain: Secondary | ICD-10-CM | POA: Diagnosis not present

## 2018-06-01 DIAGNOSIS — B356 Tinea cruris: Secondary | ICD-10-CM | POA: Diagnosis not present

## 2018-06-01 DIAGNOSIS — R1032 Left lower quadrant pain: Secondary | ICD-10-CM | POA: Diagnosis not present

## 2018-06-02 DIAGNOSIS — I10 Essential (primary) hypertension: Secondary | ICD-10-CM | POA: Diagnosis not present

## 2018-06-02 DIAGNOSIS — G629 Polyneuropathy, unspecified: Secondary | ICD-10-CM | POA: Diagnosis not present

## 2018-06-02 DIAGNOSIS — E119 Type 2 diabetes mellitus without complications: Secondary | ICD-10-CM | POA: Diagnosis not present

## 2018-06-02 DIAGNOSIS — I119 Hypertensive heart disease without heart failure: Secondary | ICD-10-CM | POA: Diagnosis not present

## 2018-06-02 DIAGNOSIS — J45909 Unspecified asthma, uncomplicated: Secondary | ICD-10-CM | POA: Diagnosis not present

## 2018-06-13 DIAGNOSIS — G4733 Obstructive sleep apnea (adult) (pediatric): Secondary | ICD-10-CM | POA: Diagnosis not present

## 2018-06-20 DIAGNOSIS — S82309A Unspecified fracture of lower end of unspecified tibia, initial encounter for closed fracture: Secondary | ICD-10-CM | POA: Diagnosis not present

## 2018-06-23 DIAGNOSIS — T8579XA Infection and inflammatory reaction due to other internal prosthetic devices, implants and grafts, initial encounter: Secondary | ICD-10-CM | POA: Diagnosis not present

## 2018-06-23 DIAGNOSIS — I739 Peripheral vascular disease, unspecified: Secondary | ICD-10-CM | POA: Diagnosis not present

## 2018-06-23 DIAGNOSIS — L603 Nail dystrophy: Secondary | ICD-10-CM | POA: Diagnosis not present

## 2018-06-23 DIAGNOSIS — M7989 Other specified soft tissue disorders: Secondary | ICD-10-CM | POA: Diagnosis not present

## 2018-06-24 ENCOUNTER — Other Ambulatory Visit: Payer: Self-pay | Admitting: Physician Assistant

## 2018-06-24 DIAGNOSIS — M79605 Pain in left leg: Secondary | ICD-10-CM

## 2018-06-27 DIAGNOSIS — G8929 Other chronic pain: Secondary | ICD-10-CM | POA: Diagnosis not present

## 2018-06-27 DIAGNOSIS — M25562 Pain in left knee: Secondary | ICD-10-CM | POA: Diagnosis not present

## 2018-06-27 DIAGNOSIS — M25561 Pain in right knee: Secondary | ICD-10-CM | POA: Diagnosis not present

## 2018-06-27 DIAGNOSIS — G894 Chronic pain syndrome: Secondary | ICD-10-CM | POA: Diagnosis not present

## 2018-06-27 DIAGNOSIS — M79642 Pain in left hand: Secondary | ICD-10-CM | POA: Diagnosis not present

## 2018-06-27 DIAGNOSIS — M545 Low back pain: Secondary | ICD-10-CM | POA: Diagnosis not present

## 2018-06-27 DIAGNOSIS — Z79891 Long term (current) use of opiate analgesic: Secondary | ICD-10-CM | POA: Diagnosis not present

## 2018-06-27 DIAGNOSIS — M79641 Pain in right hand: Secondary | ICD-10-CM | POA: Diagnosis not present

## 2018-06-30 ENCOUNTER — Ambulatory Visit
Admission: RE | Admit: 2018-06-30 | Discharge: 2018-06-30 | Disposition: A | Discharge status: Home / Self Care | Payer: Medicare Other | Source: Ambulatory Visit | Attending: Physician Assistant | Admitting: Physician Assistant

## 2018-06-30 DIAGNOSIS — M79605 Pain in left leg: Secondary | ICD-10-CM

## 2018-06-30 DIAGNOSIS — I82402 Acute embolism and thrombosis of unspecified deep veins of left lower extremity: Secondary | ICD-10-CM | POA: Diagnosis not present

## 2018-07-15 DIAGNOSIS — I119 Hypertensive heart disease without heart failure: Secondary | ICD-10-CM | POA: Diagnosis not present

## 2018-07-15 DIAGNOSIS — E119 Type 2 diabetes mellitus without complications: Secondary | ICD-10-CM | POA: Diagnosis not present

## 2018-07-15 DIAGNOSIS — G629 Polyneuropathy, unspecified: Secondary | ICD-10-CM | POA: Diagnosis not present

## 2018-07-15 DIAGNOSIS — J45909 Unspecified asthma, uncomplicated: Secondary | ICD-10-CM | POA: Diagnosis not present

## 2018-07-15 DIAGNOSIS — I10 Essential (primary) hypertension: Secondary | ICD-10-CM | POA: Diagnosis not present

## 2018-07-21 DIAGNOSIS — S82309A Unspecified fracture of lower end of unspecified tibia, initial encounter for closed fracture: Secondary | ICD-10-CM | POA: Diagnosis not present

## 2018-08-01 DIAGNOSIS — G894 Chronic pain syndrome: Secondary | ICD-10-CM | POA: Diagnosis not present

## 2018-08-01 DIAGNOSIS — Z79891 Long term (current) use of opiate analgesic: Secondary | ICD-10-CM | POA: Diagnosis not present

## 2018-08-01 DIAGNOSIS — G8929 Other chronic pain: Secondary | ICD-10-CM | POA: Diagnosis not present

## 2018-08-01 DIAGNOSIS — M25561 Pain in right knee: Secondary | ICD-10-CM | POA: Diagnosis not present

## 2018-08-01 DIAGNOSIS — G479 Sleep disorder, unspecified: Secondary | ICD-10-CM | POA: Diagnosis not present

## 2018-08-01 DIAGNOSIS — M25562 Pain in left knee: Secondary | ICD-10-CM | POA: Diagnosis not present

## 2018-08-01 DIAGNOSIS — I10 Essential (primary) hypertension: Secondary | ICD-10-CM | POA: Diagnosis not present

## 2018-08-01 DIAGNOSIS — M545 Low back pain: Secondary | ICD-10-CM | POA: Diagnosis not present

## 2018-08-11 DIAGNOSIS — J069 Acute upper respiratory infection, unspecified: Secondary | ICD-10-CM | POA: Diagnosis not present

## 2018-08-11 DIAGNOSIS — J4541 Moderate persistent asthma with (acute) exacerbation: Secondary | ICD-10-CM | POA: Diagnosis not present

## 2018-08-12 ENCOUNTER — Encounter: Payer: Self-pay | Admitting: Internal Medicine

## 2018-08-12 DIAGNOSIS — R05 Cough: Secondary | ICD-10-CM | POA: Diagnosis not present

## 2018-08-12 DIAGNOSIS — I10 Essential (primary) hypertension: Secondary | ICD-10-CM | POA: Diagnosis not present

## 2018-08-12 DIAGNOSIS — R0602 Shortness of breath: Secondary | ICD-10-CM | POA: Diagnosis not present

## 2018-08-12 DIAGNOSIS — J441 Chronic obstructive pulmonary disease with (acute) exacerbation: Secondary | ICD-10-CM | POA: Diagnosis not present

## 2018-08-12 DIAGNOSIS — J45901 Unspecified asthma with (acute) exacerbation: Secondary | ICD-10-CM | POA: Diagnosis not present

## 2018-08-12 DIAGNOSIS — E119 Type 2 diabetes mellitus without complications: Secondary | ICD-10-CM | POA: Diagnosis not present

## 2018-08-12 DIAGNOSIS — R079 Chest pain, unspecified: Secondary | ICD-10-CM | POA: Diagnosis not present

## 2018-08-13 DIAGNOSIS — E871 Hypo-osmolality and hyponatremia: Secondary | ICD-10-CM | POA: Diagnosis not present

## 2018-08-13 DIAGNOSIS — Z79891 Long term (current) use of opiate analgesic: Secondary | ICD-10-CM | POA: Diagnosis not present

## 2018-08-13 DIAGNOSIS — E119 Type 2 diabetes mellitus without complications: Secondary | ICD-10-CM | POA: Diagnosis not present

## 2018-08-13 DIAGNOSIS — R05 Cough: Secondary | ICD-10-CM | POA: Diagnosis not present

## 2018-08-13 DIAGNOSIS — R0902 Hypoxemia: Secondary | ICD-10-CM | POA: Diagnosis not present

## 2018-08-13 DIAGNOSIS — N179 Acute kidney failure, unspecified: Secondary | ICD-10-CM | POA: Diagnosis not present

## 2018-08-13 DIAGNOSIS — E875 Hyperkalemia: Secondary | ICD-10-CM | POA: Diagnosis not present

## 2018-08-13 DIAGNOSIS — Z9981 Dependence on supplemental oxygen: Secondary | ICD-10-CM | POA: Diagnosis not present

## 2018-08-13 DIAGNOSIS — Z7901 Long term (current) use of anticoagulants: Secondary | ICD-10-CM | POA: Diagnosis not present

## 2018-08-13 DIAGNOSIS — Z79899 Other long term (current) drug therapy: Secondary | ICD-10-CM | POA: Diagnosis not present

## 2018-08-13 DIAGNOSIS — I10 Essential (primary) hypertension: Secondary | ICD-10-CM | POA: Diagnosis not present

## 2018-08-13 DIAGNOSIS — J45901 Unspecified asthma with (acute) exacerbation: Secondary | ICD-10-CM | POA: Diagnosis not present

## 2018-08-13 DIAGNOSIS — M1990 Unspecified osteoarthritis, unspecified site: Secondary | ICD-10-CM | POA: Diagnosis not present

## 2018-08-13 DIAGNOSIS — T380X5A Adverse effect of glucocorticoids and synthetic analogues, initial encounter: Secondary | ICD-10-CM | POA: Diagnosis not present

## 2018-08-13 DIAGNOSIS — E1165 Type 2 diabetes mellitus with hyperglycemia: Secondary | ICD-10-CM | POA: Diagnosis not present

## 2018-08-13 DIAGNOSIS — R0602 Shortness of breath: Secondary | ICD-10-CM | POA: Diagnosis not present

## 2018-08-13 DIAGNOSIS — Z86718 Personal history of other venous thrombosis and embolism: Secondary | ICD-10-CM | POA: Diagnosis not present

## 2018-08-13 DIAGNOSIS — R079 Chest pain, unspecified: Secondary | ICD-10-CM | POA: Diagnosis not present

## 2018-08-13 DIAGNOSIS — J441 Chronic obstructive pulmonary disease with (acute) exacerbation: Secondary | ICD-10-CM | POA: Diagnosis not present

## 2018-08-13 DIAGNOSIS — Z794 Long term (current) use of insulin: Secondary | ICD-10-CM | POA: Diagnosis not present

## 2018-08-15 ENCOUNTER — Other Ambulatory Visit: Payer: Self-pay

## 2018-08-15 ENCOUNTER — Encounter (HOSPITAL_COMMUNITY): Payer: Self-pay | Admitting: Neurology

## 2018-08-15 ENCOUNTER — Inpatient Hospital Stay (HOSPITAL_COMMUNITY)
Admission: AD | Admit: 2018-08-15 | Discharge: 2018-08-18 | DRG: 683 | Disposition: A | Discharge status: Home / Self Care | Payer: Medicare Other | Source: Other Acute Inpatient Hospital | Attending: Family Medicine | Admitting: Family Medicine

## 2018-08-15 DIAGNOSIS — G5600 Carpal tunnel syndrome, unspecified upper limb: Secondary | ICD-10-CM | POA: Diagnosis not present

## 2018-08-15 DIAGNOSIS — G8929 Other chronic pain: Secondary | ICD-10-CM | POA: Diagnosis present

## 2018-08-15 DIAGNOSIS — I5032 Chronic diastolic (congestive) heart failure: Secondary | ICD-10-CM | POA: Diagnosis present

## 2018-08-15 DIAGNOSIS — G4733 Obstructive sleep apnea (adult) (pediatric): Secondary | ICD-10-CM | POA: Diagnosis present

## 2018-08-15 DIAGNOSIS — Z841 Family history of disorders of kidney and ureter: Secondary | ICD-10-CM

## 2018-08-15 DIAGNOSIS — F4310 Post-traumatic stress disorder, unspecified: Secondary | ICD-10-CM | POA: Diagnosis present

## 2018-08-15 DIAGNOSIS — J45901 Unspecified asthma with (acute) exacerbation: Secondary | ICD-10-CM | POA: Diagnosis present

## 2018-08-15 DIAGNOSIS — K219 Gastro-esophageal reflux disease without esophagitis: Secondary | ICD-10-CM | POA: Diagnosis present

## 2018-08-15 DIAGNOSIS — N179 Acute kidney failure, unspecified: Principal | ICD-10-CM | POA: Diagnosis present

## 2018-08-15 DIAGNOSIS — F319 Bipolar disorder, unspecified: Secondary | ICD-10-CM | POA: Diagnosis present

## 2018-08-15 DIAGNOSIS — E1121 Type 2 diabetes mellitus with diabetic nephropathy: Secondary | ICD-10-CM | POA: Diagnosis not present

## 2018-08-15 DIAGNOSIS — E1140 Type 2 diabetes mellitus with diabetic neuropathy, unspecified: Secondary | ICD-10-CM | POA: Diagnosis present

## 2018-08-15 DIAGNOSIS — I5189 Other ill-defined heart diseases: Secondary | ICD-10-CM | POA: Diagnosis present

## 2018-08-15 DIAGNOSIS — I959 Hypotension, unspecified: Secondary | ICD-10-CM | POA: Diagnosis present

## 2018-08-15 DIAGNOSIS — N139 Obstructive and reflux uropathy, unspecified: Secondary | ICD-10-CM | POA: Diagnosis present

## 2018-08-15 DIAGNOSIS — R278 Other lack of coordination: Secondary | ICD-10-CM | POA: Diagnosis not present

## 2018-08-15 DIAGNOSIS — M25569 Pain in unspecified knee: Secondary | ICD-10-CM | POA: Diagnosis not present

## 2018-08-15 DIAGNOSIS — Z6841 Body Mass Index (BMI) 40.0 and over, adult: Secondary | ICD-10-CM | POA: Diagnosis not present

## 2018-08-15 DIAGNOSIS — I110 Hypertensive heart disease with heart failure: Secondary | ICD-10-CM | POA: Diagnosis present

## 2018-08-15 DIAGNOSIS — I519 Heart disease, unspecified: Secondary | ICD-10-CM | POA: Diagnosis present

## 2018-08-15 DIAGNOSIS — Z8249 Family history of ischemic heart disease and other diseases of the circulatory system: Secondary | ICD-10-CM

## 2018-08-15 DIAGNOSIS — G629 Polyneuropathy, unspecified: Secondary | ICD-10-CM | POA: Diagnosis present

## 2018-08-15 DIAGNOSIS — E119 Type 2 diabetes mellitus without complications: Secondary | ICD-10-CM | POA: Diagnosis not present

## 2018-08-15 DIAGNOSIS — G4730 Sleep apnea, unspecified: Secondary | ICD-10-CM | POA: Diagnosis not present

## 2018-08-15 DIAGNOSIS — Z791 Long term (current) use of non-steroidal anti-inflammatories (NSAID): Secondary | ICD-10-CM

## 2018-08-15 DIAGNOSIS — R748 Abnormal levels of other serum enzymes: Secondary | ICD-10-CM | POA: Diagnosis present

## 2018-08-15 DIAGNOSIS — F419 Anxiety disorder, unspecified: Secondary | ICD-10-CM | POA: Diagnosis present

## 2018-08-15 DIAGNOSIS — M545 Low back pain: Secondary | ICD-10-CM | POA: Diagnosis present

## 2018-08-15 DIAGNOSIS — E875 Hyperkalemia: Secondary | ICD-10-CM | POA: Diagnosis present

## 2018-08-15 DIAGNOSIS — Z981 Arthrodesis status: Secondary | ICD-10-CM

## 2018-08-15 DIAGNOSIS — Z794 Long term (current) use of insulin: Secondary | ICD-10-CM

## 2018-08-15 DIAGNOSIS — E785 Hyperlipidemia, unspecified: Secondary | ICD-10-CM | POA: Diagnosis present

## 2018-08-15 DIAGNOSIS — Z7951 Long term (current) use of inhaled steroids: Secondary | ICD-10-CM

## 2018-08-15 DIAGNOSIS — Z86718 Personal history of other venous thrombosis and embolism: Secondary | ICD-10-CM

## 2018-08-15 DIAGNOSIS — Z79899 Other long term (current) drug therapy: Secondary | ICD-10-CM

## 2018-08-15 DIAGNOSIS — Z79891 Long term (current) use of opiate analgesic: Secondary | ICD-10-CM

## 2018-08-15 DIAGNOSIS — Z87891 Personal history of nicotine dependence: Secondary | ICD-10-CM

## 2018-08-15 DIAGNOSIS — E1129 Type 2 diabetes mellitus with other diabetic kidney complication: Secondary | ICD-10-CM | POA: Diagnosis not present

## 2018-08-15 DIAGNOSIS — I10 Essential (primary) hypertension: Secondary | ICD-10-CM | POA: Diagnosis not present

## 2018-08-15 LAB — COMPREHENSIVE METABOLIC PANEL
ALT: 11 U/L (ref 0–44)
AST: 12 U/L — ABNORMAL LOW (ref 15–41)
Albumin: 3.3 g/dL — ABNORMAL LOW (ref 3.5–5.0)
Alkaline Phosphatase: 71 U/L (ref 38–126)
Anion gap: 11 (ref 5–15)
BILIRUBIN TOTAL: 0.3 mg/dL (ref 0.3–1.2)
BUN: 65 mg/dL — ABNORMAL HIGH (ref 6–20)
CO2: 17 mmol/L — ABNORMAL LOW (ref 22–32)
Calcium: 7.8 mg/dL — ABNORMAL LOW (ref 8.9–10.3)
Chloride: 101 mmol/L (ref 98–111)
Creatinine, Ser: 4.56 mg/dL — ABNORMAL HIGH (ref 0.44–1.00)
GFR calc Af Amer: 12 mL/min — ABNORMAL LOW (ref >60)
GFR, EST NON AFRICAN AMERICAN: 11 mL/min — AB (ref >60)
Glucose, Bld: 288 mg/dL — ABNORMAL HIGH (ref 70–99)
Potassium: 5 mmol/L (ref 3.5–5.1)
Sodium: 129 mmol/L — ABNORMAL LOW (ref 135–145)
Total Protein: 6.9 g/dL (ref 6.5–8.1)

## 2018-08-15 LAB — URINALYSIS, COMPLETE (UACMP) WITH MICROSCOPIC
Bilirubin Urine: NEGATIVE
Glucose, UA: >=500 mg/dL — AB
KETONES UR: NEGATIVE mg/dL
Nitrite: NEGATIVE
Protein, ur: NEGATIVE mg/dL
Specific Gravity, Urine: 1.008 (ref 1.005–1.030)
pH: 5 (ref 5.0–8.0)

## 2018-08-15 LAB — CBC
HCT: 41.5 % (ref 36.0–46.0)
Hemoglobin: 12.4 g/dL (ref 12.0–15.0)
MCH: 24.6 pg — ABNORMAL LOW (ref 26.0–34.0)
MCHC: 29.9 g/dL — AB (ref 30.0–36.0)
MCV: 82.3 fL (ref 80.0–100.0)
Platelets: 270 10*3/uL (ref 150–400)
RBC: 5.04 MIL/uL (ref 3.87–5.11)
RDW: 15.3 % (ref 11.5–15.5)
WBC: 8.7 10*3/uL (ref 4.0–10.5)
nRBC: 0.5 % — ABNORMAL HIGH (ref 0.0–0.2)

## 2018-08-15 LAB — GLUCOSE, CAPILLARY
GLUCOSE-CAPILLARY: 286 mg/dL — AB (ref 70–99)
Glucose-Capillary: 280 mg/dL — ABNORMAL HIGH (ref 70–99)
Glucose-Capillary: 259 mg/dL — ABNORMAL HIGH (ref 70–99)

## 2018-08-15 LAB — SODIUM, URINE, RANDOM: Sodium, Ur: 29 mmol/L

## 2018-08-15 LAB — CK: Total CK: 468 U/L — ABNORMAL HIGH (ref 38–234)

## 2018-08-15 LAB — CREATININE, URINE, RANDOM: Creatinine, Urine: 51.62 mg/dL

## 2018-08-15 MED ORDER — INSULIN ASPART 100 UNIT/ML ~~LOC~~ SOLN
3.0000 [IU] | Freq: Three times a day (TID) | SUBCUTANEOUS | Status: DC
  Administered 2018-08-15 – 2018-08-18 (×9): 3 [IU] via SUBCUTANEOUS

## 2018-08-15 MED ORDER — FLUTICASONE PROPIONATE 50 MCG/ACT NA SUSP
1.0000 | Freq: Two times a day (BID) | NASAL | Status: DC
  Administered 2018-08-16 – 2018-08-18 (×4): 1 via NASAL
  Filled 2018-08-15: qty 16

## 2018-08-15 MED ORDER — SODIUM CHLORIDE 0.9 % IV SOLN
INTRAVENOUS | Status: DC
  Administered 2018-08-15 – 2018-08-18 (×6): via INTRAVENOUS

## 2018-08-15 MED ORDER — ALBUTEROL SULFATE (2.5 MG/3ML) 0.083% IN NEBU
3.0000 mL | INHALATION_SOLUTION | Freq: Four times a day (QID) | RESPIRATORY_TRACT | Status: DC
  Administered 2018-08-15 – 2018-08-16 (×5): 3 mL via RESPIRATORY_TRACT
  Filled 2018-08-15 (×5): qty 3

## 2018-08-15 MED ORDER — INSULIN ASPART 100 UNIT/ML ~~LOC~~ SOLN
0.0000 [IU] | Freq: Three times a day (TID) | SUBCUTANEOUS | Status: DC
  Administered 2018-08-15: 8 [IU] via SUBCUTANEOUS
  Administered 2018-08-16: 5 [IU] via SUBCUTANEOUS
  Administered 2018-08-16 – 2018-08-17 (×5): 3 [IU] via SUBCUTANEOUS
  Administered 2018-08-18: 2 [IU] via SUBCUTANEOUS
  Administered 2018-08-18: 3 [IU] via SUBCUTANEOUS

## 2018-08-15 MED ORDER — MOMETASONE FURO-FORMOTEROL FUM 200-5 MCG/ACT IN AERO
2.0000 | INHALATION_SPRAY | Freq: Two times a day (BID) | RESPIRATORY_TRACT | Status: DC
  Administered 2018-08-15 – 2018-08-18 (×6): 2 via RESPIRATORY_TRACT
  Filled 2018-08-15: qty 8.8

## 2018-08-15 MED ORDER — HEPARIN SODIUM (PORCINE) 5000 UNIT/ML IJ SOLN
5000.0000 [IU] | Freq: Three times a day (TID) | INTRAMUSCULAR | Status: DC
  Administered 2018-08-15 – 2018-08-18 (×9): 5000 [IU] via SUBCUTANEOUS
  Filled 2018-08-15 (×7): qty 1

## 2018-08-15 MED ORDER — DIAZEPAM 2 MG PO TABS
2.0000 mg | ORAL_TABLET | Freq: Four times a day (QID) | ORAL | Status: DC | PRN
  Administered 2018-08-15: 2 mg via ORAL
  Filled 2018-08-15: qty 1

## 2018-08-15 NOTE — H&P (Signed)
HPI  Diana Henderson VQX:[id number] DOB: 26-Aug-1970 DOA: 08/15/2018  PCP: Benito Mccreedy, MD   Chief Complaint: Acute kidney failure  HPI:  48 year old female Type 2 diabetes mellitus since 2007 Hypertension NYHA class I HFpEF Multiple orthopedic procedures in the past DVT Rx with Xarelto status post treatment BMI >40 Prior C4-7 laminectomy 10/03/2015-at that admission had acute kidney injury OSA claims to use BiPAP Asthma Bipolar Chronic pain  Initial presentation 08/11/2018 with asthmatic bronchitis Rx prednisone did not fill return to ED subsequently 2/25 admitted and Rx for acute asthma exacerbation received IV steroids inhalers  Noticed over 2 to 3-dastay at [facility] creatinine rose from 0.9-5 range with hyperkalemia and absence of any etiology-had been on ARB Lasix high dose Ibu as outpatient During hospital stay was held off of Lasix and d/c on ARB on initial rise in creat Renal ultrasound was negative urinalysis showed mild protein-conservatively initially managed with IV fluid and had low urine output  Foley catheter was placed and on arrival to [facility] nurse drained 800 cc  Review of Systems:   - Chest pain, - chills, - nausea, - vomiting, - blurred or double vision She is somewhat shaky however and has hoarseness to her voice occasionally can get her words out sometimes cannot   Past Medical History:  Diagnosis Date  . Anxiety   . Arthritis    osteoarthritis   . Asthma    has been eval. by Dr. Gwenette Greet- last 06/2014  . Bipolar 1 disorder ([place])   . Complication of anesthesia    09/13/14: malignant HTN with inducction, case cx but no sourse identified; 11/05/14 remifentanyl infusion used to attenuate hypergynamic induction  . Depression   . Diabetes mellitus   . GERD (gastroesophageal reflux disease)   . Headache    sometimes has migraines   . Hyperlipidemia   . Hypertension   . Knee pain, chronic   . OSA on CPAP   . Pain in joint, ankle  and foot 02/17/2013  . Pneumonia    hosp. for a couple of day- not sure when it was   . PTSD (post-traumatic stress disorder)   . Rotator cuff tear    bicep tendon   . Shortness of breath dyspnea   . Sleep apnea    uses CPAP intermittently, pt. doesn't remember where she had the study    Past Surgical History:  Procedure Laterality Date  . ANTERIOR CERVICAL DECOMP/DISCECTOMY FUSION N/A 11/05/2014   Procedure: ANTERIOR CERVICAL DECOMPRESSION/DISCECTOMY FUSION 1 LEVEL;  Surgeon: Consuella Lose, MD;  Location: [place] [facility];  Service: Neurosurgery;  Laterality: N/A;  Cervical five- cervical seven anterior cervical decompression with fusion interbody prosthesis plating and bonegraft  . CARPAL TUNNEL RELEASE Left 2001   Left wrist  . CERVICAL LAMINECTOMY  03/31/20176   CERVICAL 4 SEVEN LAMINECTOMIES WITH LATERAL FUSION  . Cotton Osteotomy w/ Bone Graft Right 09/30/2014   Rt foot @ [facility]  . GASTROC RECESSION EXTREMITY Right 3//20/14   Right foot  . HERNIA REPAIR     1996  . Lapidus fusion Right 01/10/12   Rt foot  . MIDTARSAL ARTHRODESIS Right 07/17/12   Rt foot  . POSTERIOR CERVICAL FUSION/FORAMINOTOMY N/A 09/16/2015   Procedure: Cervical four - seven laminectomies with lateral mass fusion;  Surgeon: Consuella Lose, MD;  Location: [place] NEURO ORS;  Service: Neurosurgery;  Laterality: N/A;  C4-7 laminectomy with lateral mass fusion  . Remove implant deep Right 07/17/12   Rt foot  . Remove Implant  Deep Right 07/22/2014   Rt foot @ [facility]  . SHOULDER ARTHROSCOPY WITH ROTATOR CUFF REPAIR AND SUBACROMIAL DECOMPRESSION Left 07/31/2016   Procedure: LEFT SHOULDER ARTHROSCOPY WITH ROTATOR CUFF REPAIR;  Surgeon: Renette Butters, MD;  Location: [place];  Service: Orthopedics;  Laterality: Left;  . TUBAL LIGATION       reports that she quit smoking about 40 years ago. Her smoking use included cigarettes. She has a 12.00 pack-year smoking history. She has never used smokeless tobacco. She reports current  alcohol use. She reports that she does not use drugs. Mobility: independant  Allergies  Allergen Reactions  . Celery Oil Hives  . Pork-Derived Products Nausea And Vomiting and Other (See Comments)    headache  . Latex Itching    Powdered latex gloves when patient wears.    . Tape Itching and Rash    PLEASE USE COBAN WRAP, IF POSSIBLE    Family History  Problem Relation Age of Onset  . Kidney disease Mother   . Heart attack Father      Prior to Admission medications   Medication Sig Start Date End Date Taking? Authorizing Provider  amLODipine (NORVASC) 10 MG tablet Take 10 mg by mouth daily.  01/20/16   [provider]  atenolol (TENORMIN) 50 MG tablet Take 1 tablet (50 mg total) by mouth daily. 09/10/14   Eugenie Filler, MD  budesonide-formoterol [facility]) 160-4.5 MCG/ACT inhaler Inhale 2 puffs into the lungs 2 (two) times daily. 05/24/16   Parrett, Fonnie Mu, NP  ciprofloxacin (CIPRO) 500 MG tablet Take 1 tablet (500 mg total) by mouth 2 (two) times daily. 12/02/17   Sheard, Myeong O, DPM  diazepam (VALIUM) 5 MG tablet Take 1 tablet (5 mg total) by mouth every 6 (six) hours as needed for muscle spasms. 12/01/15   Forde Dandy, MD  docusate sodium (COLACE) 100 MG capsule Take 1 capsule (100 mg total) by mouth 2 (two) times daily. To prevent constipation while taking pain medication. 07/31/16   Prudencio Burly III, PA-C  DULoxetine (CYMBALTA) 60 MG capsule Take 60 mg by mouth daily.     [provider]  fluticasone (FLONASE) 50 MCG/ACT nasal spray Place 1 spray into both nostrils 2 (two) times daily. Patient taking differently: Place 1 spray into both nostrils daily as needed for allergies.  04/07/15   Nona Dell, PA-C  gabapentin (NEURONTIN) 600 MG tablet Take 1 tablet (600 mg total) by mouth daily. Patient taking differently: Take 600 mg by mouth at bedtime.  09/23/15   [name], MD  ibuprofen (ADVIL,MOTRIN) 800 MG tablet TAKE 1 TABLET(800  MG) BY MOUTH EVERY 8 HOURS AS NEEDED Patient taking differently: TAKE 1 TABLET(800 MG) BY MOUTH EVERY 8 HOURS AS NEEDED FOR PAIN 04/23/16   Sheard, Myeong O, DPM  insulin aspart protamine - aspart (NOVOLOG MIX 70/30 FLEXPEN) (70-30) 100 UNIT/ML FlexPen Inject 0.3 mLs (30 Units total) into the skin 2 (two) times daily with a meal. Patient taking differently: Inject 25 Units into the skin 2 (two) times daily with a meal.  02/05/16   Leo Grosser, MD  linagliptin (TRADJENTA) 5 MG TABS tablet Take 1 tablet (5 mg total) by mouth daily with breakfast. 09/22/15   [name], MD  nabumetone (RELAFEN) 500 MG tablet Take 1 tablet (500 mg total) by mouth 2 (two) times daily. 07/25/15   Sheard, Myeong O, DPM  omeprazole (PRILOSEC) 20 MG capsule Take 20 mg by mouth daily as needed (HEARTBURN).  [provider]  ondansetron (ZOFRAN) 4 MG tablet Take 1 tablet (4 mg total) by mouth every 8 (eight) hours as needed for nausea or vomiting. 07/31/16   Prudencio Burly III, PA-C  oxyCODONE-acetaminophen (PERCOCET) 7.5-325 MG tablet Take 1 tablet by mouth every 6 (six) hours as needed. 01/07/18   Sheard, Myeong O, DPM  polyethylene glycol (MIRALAX / GLYCOLAX) packet Take 17 g by mouth daily as needed for mild constipation. Patient not taking: Reported on 06/17/2016 09/22/15   [name], MD  SYMBICORT 160-4.5 MCG/ACT inhaler INHALE 2 PUFFS INTO THE LUNGS 2 (TWO) TIMES DAILY. 02/06/17   Rigoberto Noel, MD  tiZANidine (ZANAFLEX) 4 MG tablet Take 1 tablet (4 mg total) by mouth every 8 (eight) hours as needed for muscle spasms. Patient taking differently: Take 4 mg by mouth every 8 (eight) hours as needed for muscle spasms.  09/10/14   Eugenie Filler, MD  traZODone (DESYREL) 50 MG tablet Take 50 mg by mouth at bedtime.  06/14/16   [provider]  VENTOLIN HFA 108 (90 Base) MCG/ACT inhaler INHALE 1 TO 2 PUFFS INTO THE LUNGS EVERY 6 (SIX) HOURS AS NEEDED FOR WHEEZING OR SHORTNESS OF BREATH.  10/11/16   Rigoberto Noel, MD    Physical Exam:  Vitals:   08/15/18 1540  BP: 93/63  Pulse: 73  Resp: 17  Temp: 98.4 F (36.9 C)  SpO2: 99%     Awake alert oriented but somewhat shaky jittery no icterus no pallor  Neck is soft supple  Throat is clear although Mallon Patti 4 no tonsillar hypertrophy etc.  S1-S2 no murmur patient has upper respiratory wheezes but her chest seems relatively benign she does not have any crackles or rails  Abdomen is obese nontender although slight discomfort on pressure over epigastrium  No lower extremity edema  Range of motion intact able to move all 4 limbs equally  I have personally reviewed following labs and imaging studies from [facility] and summarized them as best as I can-  Principal Problem:   AKI (acute kidney injury) ([place]) Active Problems:   Hyperlipidemia   Asthma exacerbation   CTS (carpal tunnel syndrome)   Bipolar I disorder ([place])   Assessment/Plan AKI Etiology unclear differential includes cortical necrosis, acute glomerulonephritis, obstructive uropathy-favor simple cause such as obstructive uropathy as she put out 800 cc after coming to the hospital here but was oliguric day of transfer Nephrology has ordered multiple labs and work-up including duplex ultrasound I discussed case with Dr. Louanna Raw who will be seeing the patient shortly Labs pending are urine analysis, complement, CK, GBM, immunofixation, MPO/PR-3, ANA, anti-DNA urine sodium and urine creatinine as well as antistreptolysin Hyperkalemia reported at 5.1 Treated with Kayexalate prior to transfer-we will keep on monitors overnight-if potassium >5.5 we will EKG in a.m. Asthmatic bronchitis would continue IV steroids at this time 40 twice daily IV Solu-Medrol and taper-can continue Dulera as formulary substitution, Flonase twice daily Chronic pain from numerous back surgeries and orthopedic repairs Patient is on Relafen oxycodone ibuprofen-these of  all been held plain old Tylenol at this time Bipolar- Holding duloxetine 60 and trazodone 50 at bedtime in addition to gabapentin-May use low-dose diazepam 2 mg every 6 as needed spasm-holding gabapentin 600 daily Diabetes mellitus type 2 since 9563 Complicated by nephropathy neuropathy Usually on 30 units 70/30 insulin-keep on sliding scale discontinue all long-acting and oral meds at this time     Severity of Illness: acutely The appropriate patient status  for this patient is INPATIENT. Inpatient status is judged to be reasonable and necessary in order to provide the required intensity of service to ensure the patient's safety. The patient's presenting symptoms, physical exam findings, and initial radiographic and laboratory data in the context of their chronic comorbidities is felt to place them at high risk for further clinical deterioration. Furthermore, it is not anticipated that the patient will be medically stable for discharge from the hospital within 2 midnights of admission. The following factors support the patient status of inpatient.   " The patient's presenting symptoms include uremia rapid progressing. " The worrisome physical exam findings include asterixis. " The initial radiographic and laboratory data are worrisome because of creatininte high. " The chronic co-morbidities include obesity osa htn.   * I certify that at the point of admission it is my clinical judgment that the patient will require inpatient hospital care spanning beyond 2 midnights from the point of admission due to high intensity of service, high risk for further deterioration and high frequency of surveillance required.*     DVT prophylaxis:heparin Code Status: full  Family Communication: none Consults called: renal    Time spent: 17 minutes  Brevin Mcfadden, MD  [facility] Direct contact: [phone] --[place]ia [place]  --[email or web address]; password [email or web address]  7PM-7AM contact night coverage as  above  08/15/2018, 4:06 PM

## 2018-08-15 NOTE — Consult Note (Addendum)
Reason for Consult: ARF Referring Physician:  Verlon Au, MD  Diana Henderson is an 48 y.o. female.  HPI: Diana Henderson has a PMH significant for morbid obesity, asthma, HTN, DM type 2, depression, and h/o DVT who presented to [facility] on 08/12/18 with worsening SOB and wheezing consistent with her typical asthma exacerbation.  She was not able to refill some of her medications prior to the exacerbation.  Her Cr was 0.7 on admission and she was treated with prednisone and nebulizers.  Her Cr was 0.9 on 08/13/18 but rose to 3.6 on 08/14/18.  Her ARB was discontinued but her Cr rose to 5.5 today.  She was transferred to [facility] due to her progressive renal failure.    She has produced some urine but she cannot quantify.  Her potassium was mildly elevated at 5.1 and she was given kayexalate at the outside hospital.  Renal US was without obstruction and the right kidney was 8.8cm and left kidney 9.1cm.  WBC 9.8, Hgb 12.3, and plt 246.  We were consulted for further evaluation and management of her ARF.  Labs are currently pending.  She does admit to taking ibuprofen 800mg  tid for pain but had not received any while at [facility] but was on irbesartan until today.  She had a foley catheter placed which revealed light yellow urine with some pink tinge to it.  She denies any N/V/D, dysuria, pyuria, hematuria, urgency, frequency, or retention.  She does have ongoing SOB and chest tightness but no hemoptysis or productive cough.  She also has some low back pain but no flank pain/tenderness.  Trend in Creatinine: Creatinine, Ser  Date/Time Value Ref Range Status  03/29/2016 04:30 PM 1.06 (H) 0.44 - 1.00 mg/dL Final  03/29/2016 03:36 PM 0.90 0.44 - 1.00 mg/dL Final  02/05/2016 03:36 AM 0.80 0.44 - 1.00 mg/dL Final  02/01/2016 07:46 PM 1.13 (H) 0.44 - 1.00 mg/dL Final  01/16/2016 03:15 PM 1.09 (H) 0.44 - 1.00 mg/dL Final  12/01/2015 06:38 AM 1.02 (H) 0.44 - 1.00 mg/dL Final  11/12/2015 05:39 AM 0.90 0.44 - 1.00  mg/dL Final  11/06/2015 04:09 PM 1.04 (H) 0.44 - 1.00 mg/dL Final  09/23/2015 04:40 AM 1.04 (H) 0.44 - 1.00 mg/dL Final  09/22/2015 09:02 AM 1.09 (H) 0.44 - 1.00 mg/dL Final  09/21/2015 06:03 AM 1.77 (H) 0.44 - 1.00 mg/dL Final  09/20/2015 08:03 AM 2.58 (H) 0.44 - 1.00 mg/dL Final  09/19/2015 06:28 AM 3.18 (H) 0.44 - 1.00 mg/dL Final  09/18/2015 11:45 AM 2.79 (H) 0.44 - 1.00 mg/dL Final  09/17/2015 07:14 PM 1.76 (H) 0.44 - 1.00 mg/dL Final  09/14/2015 05:21 PM 1.03 (H) 0.44 - 1.00 mg/dL Final  09/13/2015 01:47 PM 1.07 (H) 0.44 - 1.00 mg/dL Final  08/17/2015 08:45 AM 0.94 0.44 - 1.00 mg/dL Final  11/11/2014 02:28 PM 0.89 0.44 - 1.00 mg/dL Final  11/05/2014 01:45 PM 1.13 (H) 0.44 - 1.00 mg/dL Final  10/27/2014 04:00 PM 0.98 0.44 - 1.00 mg/dL Final  09/10/2014 09:13 AM 1.02 0.50 - 1.10 mg/dL Final  09/09/2014 06:54 AM 0.94 0.50 - 1.10 mg/dL Final  09/08/2014 05:50 AM 0.96 0.50 - 1.10 mg/dL Final  09/07/2014 07:31 AM 1.07 0.50 - 1.10 mg/dL Final  09/06/2014 07:30 AM 1.09 0.50 - 1.10 mg/dL Final  09/05/2014 03:00 AM 1.11 (H) 0.50 - 1.10 mg/dL Final  09/04/2014 05:55 AM 1.12 (H) 0.50 - 1.10 mg/dL Final  09/03/2014 08:45 PM 1.04 0.50 - 1.10 mg/dL Final  09/03/2014 09:30 AM 1.08 0.50 -  1.10 mg/dL Final  08/24/2014 04:47 PM 1.00 0.50 - 1.10 mg/dL Final  08/20/2014 10:19 AM 0.96 0.50 - 1.10 mg/dL Final  05/17/2014 04:49 AM 1.13 (H) 0.50 - 1.10 mg/dL Final  05/16/2014 11:33 AM 0.85 0.50 - 1.10 mg/dL Final  02/18/2014 10:50 AM 1.01 0.50 - 1.10 mg/dL Final  02/17/2014 06:37 PM 0.88 0.50 - 1.10 mg/dL Final  02/17/2014 02:47 PM 0.90 0.50 - 1.10 mg/dL Final  01/28/2014 06:29 AM 0.94 0.50 - 1.10 mg/dL Final  01/14/2014 07:14 AM 0.89 0.50 - 1.10 mg/dL Final  01/13/2014 03:25 PM 0.84 0.50 - 1.10 mg/dL Final  01/13/2014 08:45 AM 0.92 0.50 - 1.10 mg/dL Final  12/03/2013 07:45 PM 1.10 0.50 - 1.10 mg/dL Final  12/03/2013 07:12 PM 1.06 0.50 - 1.10 mg/dL Final  07/07/2013 10:19 AM 0.77 0.50 - 1.10 mg/dL  Final  02/05/2013 09:27 PM 1.00 0.50 - 1.10 mg/dL Final  11/27/2012 06:45 PM 0.83 0.50 - 1.10 mg/dL Final  11/03/2012 04:05 PM 0.90 0.50 - 1.10 mg/dL Final  06/02/2012 01:52 PM 0.99 0.50 - 1.10 mg/dL Final  05/29/2012 11:42 PM 0.97 0.50 - 1.10 mg/dL Final  02/24/2012 07:10 AM 0.85 0.50 - 1.10 mg/dL Final  02/21/2012 03:40 AM 0.80 0.50 - 1.10 mg/dL Final  12/16/2011 06:39 PM 0.90 0.50 - 1.10 mg/dL Final    PMH:   Past Medical History:  Diagnosis Date  . Anxiety   . Arthritis    osteoarthritis   . Asthma    has been eval. by Dr. Gwenette Greet- last 06/2014  . Bipolar 1 disorder ([place])   . Complication of anesthesia    09/13/14: malignant HTN with inducction, case cx but no sourse identified; 11/05/14 remifentanyl infusion used to attenuate hypergynamic induction  . Depression   . Diabetes mellitus   . GERD (gastroesophageal reflux disease)   . Headache    sometimes has migraines   . Hyperlipidemia   . Hypertension   . Knee pain, chronic   . OSA on CPAP   . Pain in joint, ankle and foot 02/17/2013  . Pneumonia    hosp. for a couple of day- not sure when it was   . PTSD (post-traumatic stress disorder)   . Rotator cuff tear    bicep tendon   . Shortness of breath dyspnea   . Sleep apnea    uses CPAP intermittently, pt. doesn't remember where she had the study    PSH:   Past Surgical History:  Procedure Laterality Date  . ANTERIOR CERVICAL DECOMP/DISCECTOMY FUSION N/A 11/05/2014   Procedure: ANTERIOR CERVICAL DECOMPRESSION/DISCECTOMY FUSION 1 LEVEL;  Surgeon: Consuella Lose, MD;  Location: [facility];  Service: Neurosurgery;  Laterality: N/A;  Cervical five- cervical seven anterior cervical decompression with fusion interbody prosthesis plating and bonegraft  . CARPAL TUNNEL RELEASE Left 2001   Left wrist  . CERVICAL LAMINECTOMY  03/31/20176   CERVICAL 4 SEVEN LAMINECTOMIES WITH LATERAL FUSION  . Cotton Osteotomy w/ Bone Graft Right 09/30/2014   Rt foot @ [facility]  . GASTROC  RECESSION EXTREMITY Right 3//20/14   Right foot  . HERNIA REPAIR     1996  . [place]us fusion Right 01/10/12   Rt foot  . MIDTARSAL ARTHRODESIS Right 07/17/12   Rt foot  . POSTERIOR CERVICAL FUSION/FORAMINOTOMY N/A 09/16/2015   Procedure: Cervical four - seven laminectomies with lateral mass fusion;  Surgeon: Consuella Lose, MD;  Location: [place] [facility];  Service: Neurosurgery;  Laterality: N/A;  C4-7 laminectomy with lateral mass fusion  .  Remove implant deep Right 07/17/12   Rt foot  . Remove Implant Deep Right 07/22/2014   Rt foot @ [facility]  . SHOULDER ARTHROSCOPY WITH ROTATOR CUFF REPAIR AND SUBACROMIAL DECOMPRESSION Left 07/31/2016   Procedure: LEFT SHOULDER ARTHROSCOPY WITH ROTATOR CUFF REPAIR;  Surgeon: Renette Butters, MD;  Location: [place];  Service: Orthopedics;  Laterality: Left;  . TUBAL LIGATION      Allergies:  Allergies  Allergen Reactions  . Celery Oil Hives  . Pork-Derived Products Nausea And Vomiting and Other (See Comments)    headache  . Latex Itching    Powdered latex gloves when patient wears.    . Tape Itching and Rash    PLEASE USE COBAN WRAP, IF POSSIBLE    Medications:   Prior to Admission medications   Medication Sig Start Date End Date Taking? Authorizing Provider  amLODipine (NORVASC) 10 MG tablet Take 10 mg by mouth daily.  01/20/16   [provider]  atenolol (TENORMIN) 50 MG tablet Take 1 tablet (50 mg total) by mouth daily. 09/10/14   Eugenie Filler, MD  budesonide-formoterol [facility]) 160-4.5 MCG/ACT inhaler Inhale 2 puffs into the lungs 2 (two) times daily. 05/24/16   Parrett, Fonnie Mu, NP  ciprofloxacin (CIPRO) 500 MG tablet Take 1 tablet (500 mg total) by mouth 2 (two) times daily. 12/02/17   Sheard, Myeong O, DPM  diazepam (VALIUM) 5 MG tablet Take 1 tablet (5 mg total) by mouth every 6 (six) hours as needed for muscle spasms. 12/01/15   Forde Dandy, MD  fluticasone (FLONASE) 50 MCG/ACT nasal spray Place 1 spray into both nostrils 2 (two)  times daily. Patient taking differently: Place 1 spray into both nostrils daily as needed for allergies.  04/07/15   Nona Dell, PA-C  ibuprofen (ADVIL,MOTRIN) 800 MG tablet TAKE 1 TABLET(800 MG) BY MOUTH EVERY 8 HOURS AS NEEDED Patient taking differently: TAKE 1 TABLET(800 MG) BY MOUTH EVERY 8 HOURS AS NEEDED FOR PAIN 04/23/16   Sheard, Myeong O, DPM  nabumetone (RELAFEN) 500 MG tablet Take 1 tablet (500 mg total) by mouth 2 (two) times daily. 07/25/15   Sheard, Myeong O, DPM  ondansetron (ZOFRAN) 4 MG tablet Take 1 tablet (4 mg total) by mouth every 8 (eight) hours as needed for nausea or vomiting. 07/31/16   Prudencio Burly III, PA-C  oxyCODONE-acetaminophen (PERCOCET) 7.5-325 MG tablet Take 1 tablet by mouth every 6 (six) hours as needed. 01/07/18   Sheard, Myeong O, DPM  VENTOLIN HFA 108 (90 Base) MCG/ACT inhaler INHALE 1 TO 2 PUFFS INTO THE LUNGS EVERY 6 (SIX) HOURS AS NEEDED FOR WHEEZING OR SHORTNESS OF BREATH. 10/11/16   Rigoberto Noel, MD    Inpatient medications: . albuterol  3 mL Inhalation Q6H  . fluticasone  1 spray Each Nare BID  . heparin  5,000 Units Subcutaneous Q8H  . insulin aspart  0-15 Units Subcutaneous TID WC  . insulin aspart  3 Units Subcutaneous TID WC  . mometasone-formoterol  2 puff Inhalation BID    Discontinued Meds:   Medications Discontinued During This Encounter  Medication Reason  . DULoxetine (CYMBALTA) 60 MG capsule   . omeprazole (PRILOSEC) 20 MG capsule   . tiZANidine (ZANAFLEX) 4 MG tablet   . polyethylene glycol (MIRALAX / GLYCOLAX) packet   . linagliptin (TRADJENTA) 5 MG TABS tablet   . gabapentin (NEURONTIN) 600 MG tablet   . insulin aspart protamine - aspart (NOVOLOG MIX 70/30 FLEXPEN) (70-30) 100 UNIT/ML FlexPen   .  traZODone (DESYREL) 50 MG tablet   . docusate sodium (COLACE) 100 MG capsule   . SYMBICORT 160-4.5 MCG/ACT inhaler     Social History:  reports that she quit smoking about 40 years ago. Her smoking use included  cigarettes. She has a 12.00 pack-year smoking history. She has never used smokeless tobacco. She reports current alcohol use. She reports that she does not use drugs.  Family History:   Family History  Problem Relation Age of Onset  . Kidney disease Mother   . Heart attack Father     Pertinent items are noted in HPI. Weight change:  No intake or output data in the 24 hours ending 08/15/18 1640 BP 93/63 (BP Location: Right Arm)   Pulse 73   Temp 98.4 F (36.9 C) (Oral)   Resp 17   SpO2 99%  Vitals:   08/15/18 1540  BP: 93/63  Pulse: 73  Resp: 17  Temp: 98.4 F (36.9 C)  TempSrc: Oral  SpO2: 99%     General appearance: alert, cooperative, mild distress and morbidly obese Head: Normocephalic, without obvious abnormality, atraumatic Resp: wheezes bilaterally Cardio: regular rate and rhythm, S1, S2 normal, no murmur, click, rub or gallop GI: obese, +BS, some epigastric discomfort with deep palpation, no guarding or rebound Extremities: edema trace pretibial Neuro: awake alert and oriented x 3 but + asterixis  Labs: Basic Metabolic Panel: No results for input(s): NA, K, CL, CO2, GLUCOSE, BUN, CREATININE, ALBUMIN, CALCIUM, PHOS in the last 168 hours. Liver Function Tests: No results for input(s): AST, ALT, ALKPHOS, BILITOT, PROT, ALBUMIN in the last 168 hours. No results for input(s): LIPASE, AMYLASE in the last 168 hours. No results for input(s): AMMONIA in the last 168 hours. CBC: No results for input(s): WBC, NEUTROABS, HGB, HCT, MCV, PLT in the last 168 hours. PT/INR: @LABRCNTIP (inr:5) Cardiac Enzymes: )No results for input(s): CKTOTAL, CKMB, CKMBINDEX, TROPONINI in the last 168 hours. CBG: Recent Labs  Lab 08/15/18 1545  GLUCAP 286*    Iron Studies: No results for input(s): IRON, TIBC, TRANSFERRIN, FERRITIN in the last 168 hours.  Xrays/Other Studies: No results found.   Assessment/Plan: 1.  ARF- non-oliguric of unclear etiology.  Possible ischemic ATN  with low BP's and concomitant ARB, however the rate of rise is more concerning for ongoing injury.  Papillary necrosis is also a consideration as she had been taking ibuprofen 800mg  tid prior to admission.   1. Will check CK levels, renal artery duplex to rule out cortical necrosis, and send off studies for possible acute GN.   2. Agree with IVF's given hypotension and will recheck labs.  Hopefully she will not require urgent HD. 3. Avoid NSAIDs and cont to hold ARB 2. Asthma exacerbation- continue with nebs and follow 3. DM- insulin per primary svc 4. Hyperkalemia- treated with kayexalate will recheck now and follow 5. Morbid obesity 6. HTN- BP is low.  Follow closely and transfer to ICU if it continues to drop. 7. Asterixis - possibly related to uremia, however she was also on high doses of gabapentin which accumulate in ARF and may account for her asterixis.   Governor Rooks Zeniah Briney 08/15/2018, 4:40 PM

## 2018-08-16 LAB — RENAL FUNCTION PANEL
Albumin: 3 g/dL — ABNORMAL LOW (ref 3.5–5.0)
Anion gap: 9 (ref 5–15)
BUN: 49 mg/dL — AB (ref 6–20)
CO2: 22 mmol/L (ref 22–32)
CREATININE: 2.63 mg/dL — AB (ref 0.44–1.00)
Calcium: 7.5 mg/dL — ABNORMAL LOW (ref 8.9–10.3)
Chloride: 105 mmol/L (ref 98–111)
GFR calc Af Amer: 24 mL/min — ABNORMAL LOW (ref >60)
GFR calc non Af Amer: 21 mL/min — ABNORMAL LOW (ref >60)
GLUCOSE: 237 mg/dL — AB (ref 70–99)
Phosphorus: 4.2 mg/dL (ref 2.5–4.6)
Potassium: 4 mmol/L (ref 3.5–5.1)
Sodium: 136 mmol/L (ref 135–145)

## 2018-08-16 LAB — GLUCOSE, CAPILLARY
GLUCOSE-CAPILLARY: 243 mg/dL — AB (ref 70–99)
Glucose-Capillary: 162 mg/dL — ABNORMAL HIGH (ref 70–99)
Glucose-Capillary: 215 mg/dL — ABNORMAL HIGH (ref 70–99)
Glucose-Capillary: 170 mg/dL — ABNORMAL HIGH (ref 70–99)

## 2018-08-16 LAB — COMPLEMENT, TOTAL: Compl, Total (CH50): >60 U/mL (ref >41)

## 2018-08-16 LAB — C4 COMPLEMENT: Complement C4, Body Fluid: 32 mg/dL (ref 14–44)

## 2018-08-16 LAB — ANTISTREPTOLYSIN O TITER: ASO: 158 IU/mL (ref 0.0–200.0)

## 2018-08-16 LAB — ANTI-DNA ANTIBODY, DOUBLE-STRANDED: ds DNA Ab: <1 IU/mL (ref 0–9)

## 2018-08-16 LAB — C3 COMPLEMENT: C3 Complement: 153 mg/dL (ref 82–167)

## 2018-08-16 MED ORDER — OXYCODONE HCL 5 MG PO TABS
7.5000 mg | ORAL_TABLET | Freq: Three times a day (TID) | ORAL | Status: AC | PRN
  Administered 2018-08-16 – 2018-08-17 (×2): 7.5 mg via ORAL
  Filled 2018-08-16 (×2): qty 2

## 2018-08-16 MED ORDER — ALBUTEROL SULFATE (2.5 MG/3ML) 0.083% IN NEBU
3.0000 mL | INHALATION_SOLUTION | Freq: Three times a day (TID) | RESPIRATORY_TRACT | Status: DC
  Administered 2018-08-17 – 2018-08-18 (×4): 3 mL via RESPIRATORY_TRACT
  Filled 2018-08-16 (×4): qty 3

## 2018-08-16 NOTE — Progress Notes (Signed)
Patient ID: Diana Henderson, female   DOB: 04-14-1971, 48 y.o.   MRN: [id number] Interval History:  She had significant urinary retention and foley was placed with immediate output of 800 cc's of urine.  She has had marked improvement of her renal function O:BP 119/69 (BP Location: Right Arm)   Pulse 72   Temp (!) 97.5 F (36.4 C) (Oral)   Resp 18   Wt 136 kg   SpO2 96%   BMI 46.96 kg/m   Intake/Output Summary (Last 24 hours) at 08/16/2018 1401 Last data filed at 08/16/2018 1320 Gross per 24 hour  Intake 1930.66 ml  Output 4650 ml  Net -2719.34 ml   Intake/Output: I/O last 3 completed shifts: In: 1630.7 [I.V.:1630.7] Out: 3050 [Urine:3050]  Intake/Output this shift:  Total I/O In: 300 [P.O.:300] Out: 1600 [Urine:1600] Weight change:  Gen: NAD CVS: no rub Resp: occ exp wheezing Abd: obese, +BS, soft Ext: no edema  Recent Labs  Lab 08/15/18 1625 08/16/18 0659  NA 129* 136  K 5.0 4.0  CL 101 105  CO2 17* 22  GLUCOSE 288* 237*  BUN 65* 49*  CREATININE 4.56* 2.63*  ALBUMIN 3.3* 3.0*  CALCIUM 7.8* 7.5*  PHOS  --  4.2  AST 12*  --   ALT 11  --    Liver Function Tests: Recent Labs  Lab 08/15/18 1625 08/16/18 0659  AST 12*  --   ALT 11  --   ALKPHOS 71  --   BILITOT 0.3  --   PROT 6.9  --   ALBUMIN 3.3* 3.0*   No results for input(s): LIPASE, AMYLASE in the last 168 hours. No results for input(s): AMMONIA in the last 168 hours. CBC: Recent Labs  Lab 08/15/18 1636  WBC 8.7  HGB 12.4  HCT 41.5  MCV 82.3  PLT 270   Cardiac Enzymes: Recent Labs  Lab 08/15/18 1625  CKTOTAL 468*   CBG: Recent Labs  Lab 08/15/18 1545 08/15/18 1736 08/15/18 2029 08/16/18 0737 08/16/18 1111  GLUCAP 286* 280* 259* 243* 162*    Iron Studies: No results for input(s): IRON, TIBC, TRANSFERRIN, FERRITIN in the last 72 hours. Studies/Results: No results found. Marland Kitchen albuterol  3 mL Inhalation Q6H  . fluticasone  1 spray Each Nare BID  . heparin  5,000 Units  Subcutaneous Q8H  . insulin aspart  0-15 Units Subcutaneous TID WC  . insulin aspart  3 Units Subcutaneous TID WC  . mometasone-formoterol  2 puff Inhalation BID    BMET    Component Value Date/Time   NA 136 08/16/2018 0659   K 4.0 08/16/2018 0659   CL 105 08/16/2018 0659   CO2 22 08/16/2018 0659   GLUCOSE 237 (H) 08/16/2018 0659   BUN 49 (H) 08/16/2018 0659   CREATININE 2.63 (H) 08/16/2018 0659   CALCIUM 7.5 (L) 08/16/2018 0659   GFRNONAA 21 (L) 08/16/2018 0659   GFRAA 24 (L) 08/16/2018 0659   CBC    Component Value Date/Time   WBC 8.7 08/15/2018 1636   RBC 5.04 08/15/2018 1636   HGB 12.4 08/15/2018 1636   HCT 41.5 08/15/2018 1636   PLT 270 08/15/2018 1636   MCV 82.3 08/15/2018 1636   MCH 24.6 (L) 08/15/2018 1636   MCHC 29.9 (L) 08/15/2018 1636   RDW 15.3 08/15/2018 1636   LYMPHSABS 3.1 11/12/2015 0534   MONOABS 0.7 11/12/2015 0534   EOSABS 0.1 11/12/2015 0534   BASOSABS 0.0 11/12/2015 0534    Assessment/Plan: 1.  ARF- non-oliguric  of unclear etiology.  Possible ischemic ATN with low BP's and concomitant ARB, however the rate of rise is more concerning for ongoing injury.  Papillary necrosis is also a consideration as she had been taking ibuprofen 800mg  tid prior to admission.  Also contributing factor is bladder outlet obstruction as she responded to foley cath placement 1. CK levels mildly elevated, normal complements, ASO titer.  ANA, ANCA, dsDNA pending 2. Will cancel renal artery duplex to rule out cortical necrosis given marked improvement overnight. 3. Agree with decreasing IVF's given response. 4. Avoid NSAIDs and cont to hold ARB 2. Asthma exacerbation- continue with nebs and follow 3. Bladder outlet obstruction vs retention- improved with foley catheter. 4. DM- insulin per primary svc 5. Hyperkalemia- treated with kayexalate will recheck now and follow 6. Morbid obesity 7. HTN- BP is low.  Follow closely and transfer to ICU if it continues to  drop. 8. Asterixis - possibly related to uremia, however more likely due to combo of lyrica and gabapentin in setting of ARF.  Improving as well. Agree with stopping lyrica and decrease dose of gabapentin. Donetta Potts, MD [employer] [phone]

## 2018-08-16 NOTE — Progress Notes (Signed)
[facility] PROGRESS NOTE  Diana Henderson VPX:[id number] DOB: 1971/04/01 DOA: 08/15/2018 PCP: Benito Mccreedy, MD   Narrative: 47 year old female Type 2 diabetes mellitus since 2007 Hypertension NYHA class I HFpEF Multiple orthopedic procedures in the past DVT Rx with Xarelto status post treatment BMI >40 Prior C4-7 laminectomy 10/03/2015-at that admission had acute kidney injury OSA claims to use BiPAP Asthma Bipolar Chronic pain managed at [facility] pain clinic on [address]-recent start on Lyrica  Admission 2/24 for asthmatic bronchitis at [facility] AKI despite discontinuation of various meds with a peak of creatinine to 5 usually in the 0.9 range Transferred to [facility] nephrology consulted   A & Plan AKI It appears she might of had a postobstructive uropathy as her creatinine has trended from 5-2-I am cautiously optimistic that this will persist  I am in favor of cutting back IV fluids to 75 cc/h-patient states that she is emptied "2 bags" and urine output shows -1419 cc-she does not report any prior history of an adequate emptying of the bladder but may require urodynamic studies if her creatinine trends to normal-->blood pressure being low could also close risk and meds are on hold as below  At this time because I am not sure what caused the improvement I would continue steroids at current dose for presumed GN but rapidly taper if labs are not consistent and significant improvement in creatinine clearance  She will need to have Benicar as well as Lasix trial at least until she has outpatient biopsy will not resume in hospital Another thought is that she is on high doses of opioids which can have an anticholinergic effect and cause retention of urine and may be taking those medications and iodine accelerator on accustomed way at outside hospital may have caused acute retention  Defer further management including lab work-up to  nephrologist  Hyperkalemia on admission Resolved at present time Labs in a.m.  Elevated CK Patient's CK is elevated to some degree I will trend the same-suspect a component of this could be from her elevated body mass  Chronic pain followed by pain clinic [facility] off [address] Explained to both the patient and her daughter that we will probably need to continue to use low-dose medication and daughter does concur that she has had issues with polypharmacy in the past Continue oxycodone 7.5 every 6 as needed Holding Lyrica 50 every 8, it looks like patient was also on gabapentin at some point and that has been held in addition  Hypotension on admission On hold of her amlodipine as well as her atenolol We will cautiously reimplement going forward as needed  Bipolar Holding Remeron 15 daily, trazodone 50 at bedtime, okay to use diazepam 5 mg every 6 as needed spasm  Asthmatic bronchitis Patient has upper airway sounds she does not appear to have any chest wheezing Monitor continue Ventolin inhaler can also continue Symbicort twice daily  Morbid obesity BMI 46  Heparin Inpatient Discussed clearly with daughter at the bedside in person Disposition is pending her improvement of her creatinine to baseline levels  Verlon Au, MD  [facility] Via [employer] app OR [email or web address] 7PM-7AM contact night coverage as above 08/16/2018, 9:29 AM  LOS: 1 day   Consultants:  Nephrology  Procedures:  None  Antimicrobials:  No  Interval history/Subjective: Awake alert coherent no shaking today somewhat weak sitting up in the bed No chest pain No fever Passing good urine No nausea no vomiting  Objective:  Vitals:  Vitals:   08/16/18 0411 08/16/18  0827  BP: 140/78   Pulse: 72   Resp: 19   Temp: 98 F (36.7 C)   SpO2: 98% 99%    Exam:  EOMI thick neck Mallampati 4 no icterus no pallor Soft supple neck with no LAM no thyromegaly S1-S2 no murmur rub or gallop Abdomen is soft  slight dehiscence noted in the middle abdomen no tenderness cannot appreciate organomegaly Neurologically intact although very flat affect power is 5/5 limited by effort and strength to some degree No lower extremity edema   I have personally reviewed the following:  DATA   Labs:  BUN/creatinine down from 65/4 0.5-->49/4.6 bicarb is up from 17-22  Potassium is down from 5.0-4.0  ASO is 158  Complement C3 is 134 CH 50 complement is greater than 60 C4 32  Imaging studies:  None yet  Medical tests:  No   Scheduled Meds: . albuterol  3 mL Inhalation Q6H  . fluticasone  1 spray Each Nare BID  . heparin  5,000 Units Subcutaneous Q8H  . insulin aspart  0-15 Units Subcutaneous TID WC  . insulin aspart  3 Units Subcutaneous TID WC  . mometasone-formoterol  2 puff Inhalation BID   Continuous Infusions: . sodium chloride 125 mL/hr at 08/16/18 7948    Principal Problem:   AKI (acute kidney injury) ([place]) Active Problems:   Hyperlipidemia   Asthma exacerbation   CTS (carpal tunnel syndrome)   Bipolar I disorder ([place])   LOS: 1 day

## 2018-08-17 LAB — RENAL FUNCTION PANEL
Albumin: 2.8 g/dL — ABNORMAL LOW (ref 3.5–5.0)
Anion gap: 7 (ref 5–15)
BUN: 27 mg/dL — ABNORMAL HIGH (ref 6–20)
CO2: 23 mmol/L (ref 22–32)
Calcium: 8 mg/dL — ABNORMAL LOW (ref 8.9–10.3)
Chloride: 110 mmol/L (ref 98–111)
Creatinine, Ser: 1.44 mg/dL — ABNORMAL HIGH (ref 0.44–1.00)
GFR calc Af Amer: 49 mL/min — ABNORMAL LOW (ref >60)
GFR calc non Af Amer: 43 mL/min — ABNORMAL LOW (ref >60)
Glucose, Bld: 203 mg/dL — ABNORMAL HIGH (ref 70–99)
PHOSPHORUS: 2.5 mg/dL (ref 2.5–4.6)
Potassium: 3.9 mmol/L (ref 3.5–5.1)
SODIUM: 140 mmol/L (ref 135–145)

## 2018-08-17 LAB — GLUCOSE, CAPILLARY
Glucose-Capillary: 183 mg/dL — ABNORMAL HIGH (ref 70–99)
Glucose-Capillary: 165 mg/dL — ABNORMAL HIGH (ref 70–99)
Glucose-Capillary: 180 mg/dL — ABNORMAL HIGH (ref 70–99)
Glucose-Capillary: 133 mg/dL — ABNORMAL HIGH (ref 70–99)

## 2018-08-17 LAB — MPO/PR-3 (ANCA) ANTIBODIES
ANCA Proteinase 3: <3.5 U/mL (ref 0.0–3.5)
Myeloperoxidase Abs: <9 U/mL (ref 0.0–9.0)

## 2018-08-17 MED ORDER — TRAZODONE HCL 50 MG PO TABS
50.0000 mg | ORAL_TABLET | Freq: Every day | ORAL | Status: DC
  Administered 2018-08-17: 50 mg via ORAL
  Filled 2018-08-17: qty 1

## 2018-08-17 NOTE — Progress Notes (Signed)
Patient ID: Diana Henderson, female   DOB: 13-Nov-1970, 48 y.o.   MRN: [id number] S: Had some back pain this morning, improved. O:BP 123/71 (BP Location: Left Arm)   Pulse 73   Temp 98.1 F (36.7 C) (Oral)   Resp 18   Ht 5\' 7"  (1.702 m)   Wt (!) 136.2 kg   SpO2 94%   BMI 47.03 kg/m   Intake/Output Summary (Last 24 hours) at 08/17/2018 1139 Last data filed at 08/17/2018 0950 Gross per 24 hour  Intake 1259.94 ml  Output 3275 ml  Net -2015.06 ml   Intake/Output: I/O last 3 completed shifts: In: 3070.6 [P.O.:840; I.V.:2230.6] Out: 4850 [Urine:4850]  Intake/Output this shift:  Total I/O In: 120 [P.O.:120] Out: 675 [Urine:675] Weight change: 0.2 kg Gen: NAD CVS: no rub Resp: no rales, occ exp wheezes Abd: obese, +BS, soft Ext: no edmea  Recent Labs  Lab 08/15/18 1625 08/16/18 0659 08/17/18 0615  NA 129* 136 140  K 5.0 4.0 3.9  CL 101 105 110  CO2 17* 22 23  GLUCOSE 288* 237* 203*  BUN 65* 49* 27*  CREATININE 4.56* 2.63* 1.44*  ALBUMIN 3.3* 3.0* 2.8*  CALCIUM 7.8* 7.5* 8.0*  PHOS  --  4.2 2.5  AST 12*  --   --   ALT 11  --   --    Liver Function Tests: Recent Labs  Lab 08/15/18 1625 08/16/18 0659 08/17/18 0615  AST 12*  --   --   ALT 11  --   --   ALKPHOS 71  --   --   BILITOT 0.3  --   --   PROT 6.9  --   --   ALBUMIN 3.3* 3.0* 2.8*   No results for input(s): LIPASE, AMYLASE in the last 168 hours. No results for input(s): AMMONIA in the last 168 hours. CBC: Recent Labs  Lab 08/15/18 1636  WBC 8.7  HGB 12.4  HCT 41.5  MCV 82.3  PLT 270   Cardiac Enzymes: Recent Labs  Lab 08/15/18 1625  CKTOTAL 468*   CBG: Recent Labs  Lab 08/16/18 1111 08/16/18 1625 08/16/18 2104 08/17/18 0736 08/17/18 1133  GLUCAP 162* 215* 170* 183* 165*    Iron Studies: No results for input(s): IRON, TIBC, TRANSFERRIN, FERRITIN in the last 72 hours. Studies/Results: No results found. Marland Kitchen albuterol  3 mL Inhalation TID  . fluticasone  1 spray Each Nare BID  .  heparin  5,000 Units Subcutaneous Q8H  . insulin aspart  0-15 Units Subcutaneous TID WC  . insulin aspart  3 Units Subcutaneous TID WC  . mometasone-formoterol  2 puff Inhalation BID    BMET    Component Value Date/Time   NA 140 08/17/2018 0615   K 3.9 08/17/2018 0615   CL 110 08/17/2018 0615   CO2 23 08/17/2018 0615   GLUCOSE 203 (H) 08/17/2018 0615   BUN 27 (H) 08/17/2018 0615   CREATININE 1.44 (H) 08/17/2018 0615   CALCIUM 8.0 (L) 08/17/2018 0615   GFRNONAA 43 (L) 08/17/2018 0615   GFRAA 49 (L) 08/17/2018 0615   CBC    Component Value Date/Time   WBC 8.7 08/15/2018 1636   RBC 5.04 08/15/2018 1636   HGB 12.4 08/15/2018 1636   HCT 41.5 08/15/2018 1636   PLT 270 08/15/2018 1636   MCV 82.3 08/15/2018 1636   MCH 24.6 (L) 08/15/2018 1636   MCHC 29.9 (L) 08/15/2018 1636   RDW 15.3 08/15/2018 1636   LYMPHSABS 3.1 11/12/2015 0534  MONOABS 0.7 11/12/2015 0534   EOSABS 0.1 11/12/2015 0534   BASOSABS 0.0 11/12/2015 0534     Assessment/Plan: 1. ARF- non-oliguric of unclear etiology. Possible ischemic ATN with low BP's and concomitant ARB, however the rate of rise is more concerning for ongoing injury. Papillary necrosis is also a consideration as she had been taking ibuprofen 800mg  tid prior to admission. Also contributing factor is bladder outlet obstruction as she responded to foley cath placement 1. CK levels mildly elevated, normal complements, ASO titer and negative ANA.  ANCA and  dsDNA pending but had normal complements. 2. Will cancel renal artery duplex to rule out cortical necrosis given marked improvement overnight. 3. Agree with decreasing IVF's given response. 4. Avoid NSAIDs and cont to hold ARB 5. Will need to f/u with urology for retention. 6. Nothing further to add.  Will sign off.  Will only need to see [place] if her ANCA or dsDNA is positive.  Please call with questions or concerns 2. Asthma exacerbation- continue with nebs and follow 3. Bladder outlet  obstruction vs retention- improved with foley catheter. 4. DM- insulin per primary svc 5. Hyperkalemia- treated with kayexalate will recheck now and follow 6. Morbid obesity 7. HTN- BP is low. Follow closely and transfer to ICU if it continues to drop. 8. Asterixis - possibly related to uremia, however more likely due to combo of lyrica and gabapentin in setting of ARF.  Improving as well. Agree with stopping lyrica and decrease dose of gabapentin.  Donetta Potts, MD [employer] [phone]

## 2018-08-17 NOTE — Progress Notes (Signed)
   08/16/18 2041  Provider Notification  Provider Name/Title [place] NP  Date Provider Notified 08/16/18  Time Provider Notified 2041  Notification Type Page  Notification Reason Requested by patient/family (c/o low back pain 7/10 - aching - needs pain med)  Response See new orders  Date of Provider Response 08/16/18   New orders received and implemented.  Will continue to monitor patient.  Earleen Reaper RN

## 2018-08-17 NOTE — Progress Notes (Signed)
[facility] PROGRESS NOTE  Diana Henderson [id number] DOB: 30-Aug-1970 DOA: 08/15/2018 PCP: Benito Mccreedy, MD   Narrative: 48 year old female Type 2 diabetes mellitus since 2007 Hypertension NYHA class I HFpEF Multiple orthopedic procedures in the past DVT Rx with Xarelto status post treatment BMI >40 Prior C4-7 laminectomy 10/03/2015-at that admission had acute kidney injury OSA claims to use BiPAP Asthma Bipolar Chronic pain managed at [facility] pain clinic on [address]-recent start on Lyrica  Admission 2/24 for asthmatic bronchitis at [facility] AKI despite discontinuation of various meds with a peak of creatinine to 5 usually in the 0.9 range Transferred to [facility] nephrology consulted   A & Plan AKI It appears she might of had a postobstructive uropathy as her creatinine has trended from 5---->1 holding Benicar/Lasix  Await return of lab-work anca ds dna but if neg can stop work up No biopsy needed Appreciate nephro input  Obst uropathy  Attempt catheter clamp--if not successful will need OP urology follow up and Voiding studies  Hyperkalemia on admission Resolved at present time Labs in a.m.  Elevated CK Patient's CK is elevated to some degree I will trend the same-suspect a component of this could be from her elevated body mass  Chronic pain followed by pain clinic [facility] [address] [place] has had issues with polypharmacy in the past Continue oxycodone 7.5 every 6 as needed Holding Lyrica 50 every 8, it looks like patient was also on gabapentin at some point and that has been held in addition  Hypotension on admission On hold of her amlodipine as well as her atenolol Resume on OP follow up-BP right now is below 130  Bipolar Holding Remeron 15 daily,  But resume trazodone 50 at bedtime, okay to use diazepam 5 mg every 6 as needed spasm  Asthmatic bronchitis Patient has upper airway sounds she does not appear to have any chest  wheezing Monitor continue Ventolin inhaler can also continue Symbicort twice daily  Morbid obesity BMI 46  Heparin Inpatient Discussed  with daughter at the bedside 3/1 Likely d/c home am  Verlon Au, MD  [facility] Via [employer] app OR [email or web address] 7PM-7AM contact night coverage as above 08/17/2018, 2:46 PM  LOS: 2 days   Consultants:  Nephrology  Procedures:  None  Antimicrobials:  No  Interval history/Subjective:  Awake alert coherent in nad  No distress no fever no chills  No cp No vomit nor n   Objective:  Vitals:  Vitals:   08/17/18 0840 08/17/18 0943  BP:  123/71  Pulse:  73  Resp:  18  Temp:  98.1 F (36.7 C)  SpO2: 94% 94%    Exam:  EOMI thick neck more awake alert  Mallampati 4 no icterus no pallor Soft supple neck with no LAM no thyromegaly S1-S2 no murmur rub or gallop abd soft no changes no HSM  very flat affect  power is 5/5 limited by effort and strength to some degree No lower extremity edema   I have personally reviewed the following:  DATA   Labs:  BUN/creatinine down from 65/4 0.5-->49/4.6 --->27/1.44  Potassium 3.9  ASO is 158  Complement C3 is 134 CH 50 complement is greater than 60 C4 32  Imaging studies:  None yet  Medical tests:  No   Scheduled Meds: . albuterol  3 mL Inhalation TID  . fluticasone  1 spray Each Nare BID  . heparin  5,000 Units Subcutaneous Q8H  . insulin aspart  0-15 Units Subcutaneous TID WC  .  insulin aspart  3 Units Subcutaneous TID WC  . mometasone-formoterol  2 puff Inhalation BID   Continuous Infusions: . sodium chloride 50 mL/hr at 08/17/18 1223    Principal Problem:   AKI (acute kidney injury) ([place]) Active Problems:   Hyperlipidemia   Asthma exacerbation   CTS (carpal tunnel syndrome)   Bipolar I disorder ([place])   LOS: 2 days

## 2018-08-18 ENCOUNTER — Telehealth: Payer: Self-pay

## 2018-08-18 LAB — RENAL FUNCTION PANEL
Albumin: 2.6 g/dL — ABNORMAL LOW (ref 3.5–5.0)
Anion gap: 6 (ref 5–15)
BUN: 12 mg/dL (ref 6–20)
CO2: 26 mmol/L (ref 22–32)
Calcium: 8 mg/dL — ABNORMAL LOW (ref 8.9–10.3)
Chloride: 109 mmol/L (ref 98–111)
Creatinine, Ser: 1.04 mg/dL — ABNORMAL HIGH (ref 0.44–1.00)
GFR calc Af Amer: >60 mL/min (ref >60)
GFR calc non Af Amer: >60 mL/min (ref >60)
Glucose, Bld: 138 mg/dL — ABNORMAL HIGH (ref 70–99)
Phosphorus: 2.3 mg/dL — ABNORMAL LOW (ref 2.5–4.6)
Potassium: 3.6 mmol/L (ref 3.5–5.1)
Sodium: 141 mmol/L (ref 135–145)

## 2018-08-18 LAB — GLUCOSE, CAPILLARY
Glucose-Capillary: 157 mg/dL — ABNORMAL HIGH (ref 70–99)
Glucose-Capillary: 141 mg/dL — ABNORMAL HIGH (ref 70–99)

## 2018-08-18 LAB — ANTINUCLEAR ANTIBODIES, IFA: ANA Ab, IFA: NEGATIVE

## 2018-08-18 LAB — GLOMERULAR BASEMENT MEMBRANE ANTIBODIES: GBM Ab: 3 units (ref 0–20)

## 2018-08-18 LAB — IMMUNOFIXATION, URINE

## 2018-08-18 MED ORDER — DIAZEPAM 2 MG PO TABS: 2.0000 mg | ORAL_TABLET | Freq: Four times a day (QID) | ORAL | 0 refills | Status: AC | PRN

## 2018-08-18 NOTE — Care Management Important Message (Signed)
Important Message  Patient Details  Name: Diana Henderson MRN: [id number] Date of Birth: Aug 01, 1970   Medicare Important Message Given:  Yes    Selicia Windom Montine Circle 08/18/2018, 4:31 PM

## 2018-08-18 NOTE — Care Management Note (Signed)
Case Management Note Manya Silvas, RN MSN CCM Transitions of Care 50[address] [place] [phone]  Patient Details  Name: Diana Henderson MRN: [id number] Date of Birth: [birth year]  Subjective/Objective:     AKI              Action/Plan: PTA home with daughter. Spoke with patient and daughter at bedside. Patient has a caregiver through [place]. Using local pharmacy for medications at this time, but reports was using mail order pharmacy until changes in medications. Has DME-wheelchair, cane-reports RW is broken and it is too soon to get new one-discussed possibly finding used equipment at [employer]. Daughter to provide transportation home and to PCP appointments. Reports PCP at [facility]. No transition of care needs identified.   Expected Discharge Date:  08/18/18               Expected Discharge Plan:  Home/Self Care  In-House Referral:  NA  Discharge planning Services  CM Consult  Post Acute Care Choice:  NA Choice offered to:  NA  DME Arranged:  N/A DME Agency:  NA  HH Arranged:  NA HH Agency:  NA  Status of Service:  Completed, signed off  If discussed at [place] of Stay Meetings, dates discussed:    Additional Comments:  Bartholomew Crews, RN 08/18/2018, 12:37 PM

## 2018-08-18 NOTE — Discharge Summary (Signed)
Physician Discharge Summary  Diana Henderson [id number] DOB: 09-Apr-1971 DOA: 08/15/2018  PCP: Benito Mccreedy, MD  Admit date: 08/15/2018 Discharge date: 08/18/2018  Time spent: 25 minutes  Recommendations for Outpatient Follow-up:  1. Follow ANCA as OP 2. Needs bmet in 1 week 3. Minimize narcotics on d/c  Discharge Diagnoses:  Principal Problem:   AKI (acute kidney injury) ([place]) Active Problems:   Hyperlipidemia   Asthma exacerbation   CTS (carpal tunnel syndrome)   Bipolar I disorder ([place])   Discharge Condition: improved  Diet recommendation: hh low salt  Filed Weights   08/15/18 2026 08/16/18 2103 08/17/18 2051  Weight: 136 kg (!) 136.2 kg (!) 136.4 kg    History of present illness:  48 year old female Type 2 diabetes mellitus since 2007 Hypertension NYHA class I HFpEF Multiple orthopedic procedures in the past DVT Rx with Xarelto status post treatment BMI >40 Prior C4-7 laminectomy 10/03/2015-at that admission had acute kidney injury OSA claims to use BiPAP Asthma Bipolar Chronic pain managed at [facility] clinic on [address]-recent start on Lyrica  Admission 2/24 for asthmatic bronchitis at [facility] AKI despite discontinuation of various meds with a peak of creatinine to 5 usually in the 0.9 range Transferred to [facility] nephrology consulted  Hospital Course:  AKI It appears she might of had a postobstructive uropathy as her creatinine has trended from 5---->1 holding Benicar/Lasix on d/c Obst uropathy             Attempt catheter clamp 3/1 and successful  Close OP monitoring going forward Hyperkalemia on admission Resolved at present time Elevated CK Patient's CK is elevated from her elevated body mass-no further work-up Chronic pain followed by pain clinic [facility] off [address] [place] has had issues with polypharmacy in the past Held lyrica, oxy,gabapentin Resumed diazepam on d/c Hypotension on admission Cont Amlodipine on  d/c, no Atenolol as pressures low Resume on OP follow up-BP right now is below 130 Bipolar Resumed remeron on d/c-holding multiple other meds Asthmatic bronchitis Patient has upper airway sounds she does not appear to have any chest wheezing Monitor continue Ventolin inhaler can also continue Symbicort twice daily Morbid obesity BMI 46  Procedures: muliple renal labs Consultations:  nephrology  Discharge Exam: Vitals:   08/18/18 0801 08/18/18 0908  BP:  (!) 144/76  Pulse: 78 70  Resp: 18 18  Temp:  97.9 F (36.6 C)  SpO2: 98% 98%    General: awake alert pleasan tin nad Cardiovascular: s1 s 2no m/r/g Respiratory: clear no added sound Much more coherent off of medications and more interactive   Discharge Instructions   Discharge Instructions    Diet - low sodium heart healthy   Complete by:  As directed    Discharge instructions   Complete by:  As directed    Note multipel medications have changed-you will need these revisited as an outpaitent Please follow up with your regular MD in about 1 week and get follow up labs   Increase activity slowly   Complete by:  As directed      Allergies as of 08/18/2018      Reactions   Celery Oil Hives   Pork-derived Products Nausea And Vomiting, Other (See Comments)   headache   Latex Itching   Powdered latex gloves when patient wears.     Tape Itching, Rash   PLEASE USE COBAN WRAP, IF POSSIBLE      Medication List    STOP taking these medications   atenolol 50 MG tablet  Commonly known as:  TENORMIN   doxycycline 100 MG tablet Commonly known as:  VIBRA-TABS   fluticasone 50 MCG/ACT nasal spray Commonly known as:  FLONASE   furosemide 20 MG tablet Commonly known as:  LASIX   nabumetone 500 MG tablet Commonly known as:  RELAFEN   olmesartan 40 MG tablet Commonly known as:  BENICAR   oxyCODONE-acetaminophen 7.5-325 MG tablet Commonly known as:  PERCOCET   predniSONE 50 MG tablet Commonly known as:   DELTASONE   pregabalin 50 MG capsule Commonly known as:  LYRICA     TAKE these medications   amLODipine 10 MG tablet Commonly known as:  NORVASC Take 10 mg by mouth daily.   budesonide-formoterol 160-4.5 MCG/ACT inhaler Commonly known as:  SYMBICORT Inhale 2 puffs into the lungs 2 (two) times daily.   diazepam 2 MG tablet Commonly known as:  VALIUM Take 1 tablet (2 mg total) by mouth every 6 (six) hours as needed for muscle spasms. What changed:    medication strength  how much to take   mirtazapine 15 MG tablet Commonly known as:  REMERON Take 15 mg by mouth daily.   traZODone 50 MG tablet Commonly known as:  DESYREL Take 50 mg by mouth at bedtime.   TRULICITY 1.5 XB/2.8UX Sopn Generic drug:  Dulaglutide Inject 1.5 mg into the skin once a week.   VENTOLIN HFA 108 (90 Base) MCG/ACT inhaler Generic drug:  albuterol INHALE 1 TO 2 PUFFS INTO THE LUNGS EVERY 6 (SIX) HOURS AS NEEDED FOR WHEEZING OR SHORTNESS OF BREATH. What changed:  See the new instructions.      Allergies  Allergen Reactions  . Celery Oil Hives  . Pork-Derived Products Nausea And Vomiting and Other (See Comments)    headache  . Latex Itching    Powdered latex gloves when patient wears.    . Tape Itching and Rash    PLEASE USE COBAN WRAP, IF POSSIBLE      The results of significant diagnostics from this hospitalization (including imaging, microbiology, ancillary and laboratory) are listed below for reference.    Significant Diagnostic Studies: No results found.  Microbiology: No results found for this or any previous visit (from the past 240 hour(s)).   Labs: Basic Metabolic Panel: Recent Labs  Lab 08/15/18 1625 08/16/18 0659 08/17/18 0615 08/18/18 0446  NA 129* 136 140 141  K 5.0 4.0 3.9 3.6  CL 101 105 110 109  CO2 17* 22 23 26   GLUCOSE 288* 237* 203* 138*  BUN 65* 49* 27* 12  CREATININE 4.56* 2.63* 1.44* 1.04*  CALCIUM 7.8* 7.5* 8.0* 8.0*  PHOS  --  4.2 2.5 2.3*   Liver  Function Tests: Recent Labs  Lab 08/15/18 1625 08/16/18 0659 08/17/18 0615 08/18/18 0446  AST 12*  --   --   --   ALT 11  --   --   --   ALKPHOS 71  --   --   --   BILITOT 0.3  --   --   --   PROT 6.9  --   --   --   ALBUMIN 3.3* 3.0* 2.8* 2.6*   No results for input(s): LIPASE, AMYLASE in the last 168 hours. No results for input(s): AMMONIA in the last 168 hours. CBC: Recent Labs  Lab 08/15/18 1636  WBC 8.7  HGB 12.4  HCT 41.5  MCV 82.3  PLT 270   Cardiac Enzymes: Recent Labs  Lab 08/15/18 1625  CKTOTAL 468*  BNP: BNP (last 3 results) No results for input(s): BNP in the last 8760 hours.  ProBNP (last 3 results) No results for input(s): PROBNP in the last 8760 hours.  CBG: Recent Labs  Lab 08/17/18 0736 08/17/18 1133 08/17/18 1836 08/17/18 2051 08/18/18 0711  GLUCAP 183* 165* 180* 133* 157*       Signed:  Nita Sells MD   [facility] 08/18/2018, 10:04 AM

## 2018-08-19 DIAGNOSIS — S82309A Unspecified fracture of lower end of unspecified tibia, initial encounter for closed fracture: Secondary | ICD-10-CM | POA: Diagnosis not present

## 2018-08-19 DIAGNOSIS — Q7270 Split foot, unspecified lower limb: Secondary | ICD-10-CM | POA: Diagnosis not present

## 2018-08-28 DIAGNOSIS — I10 Essential (primary) hypertension: Secondary | ICD-10-CM | POA: Diagnosis not present

## 2018-08-28 DIAGNOSIS — J45909 Unspecified asthma, uncomplicated: Secondary | ICD-10-CM | POA: Diagnosis not present

## 2018-08-28 DIAGNOSIS — M79641 Pain in right hand: Secondary | ICD-10-CM | POA: Diagnosis not present

## 2018-08-28 DIAGNOSIS — Z7689 Persons encountering health services in other specified circumstances: Secondary | ICD-10-CM | POA: Diagnosis not present

## 2018-08-28 DIAGNOSIS — Z79891 Long term (current) use of opiate analgesic: Secondary | ICD-10-CM | POA: Diagnosis not present

## 2018-08-28 DIAGNOSIS — M25562 Pain in left knee: Secondary | ICD-10-CM | POA: Diagnosis not present

## 2018-08-28 DIAGNOSIS — G629 Polyneuropathy, unspecified: Secondary | ICD-10-CM | POA: Diagnosis not present

## 2018-08-28 DIAGNOSIS — M545 Low back pain: Secondary | ICD-10-CM | POA: Diagnosis not present

## 2018-08-28 DIAGNOSIS — G8929 Other chronic pain: Secondary | ICD-10-CM | POA: Diagnosis not present

## 2018-08-28 DIAGNOSIS — M25561 Pain in right knee: Secondary | ICD-10-CM | POA: Diagnosis not present

## 2018-08-28 DIAGNOSIS — M79642 Pain in left hand: Secondary | ICD-10-CM | POA: Diagnosis not present

## 2018-08-28 DIAGNOSIS — E119 Type 2 diabetes mellitus without complications: Secondary | ICD-10-CM | POA: Diagnosis not present

## 2018-08-28 DIAGNOSIS — I119 Hypertensive heart disease without heart failure: Secondary | ICD-10-CM | POA: Diagnosis not present

## 2018-08-28 DIAGNOSIS — G894 Chronic pain syndrome: Secondary | ICD-10-CM | POA: Diagnosis not present

## 2018-09-11 DIAGNOSIS — G4733 Obstructive sleep apnea (adult) (pediatric): Secondary | ICD-10-CM | POA: Diagnosis not present

## 2018-09-24 DIAGNOSIS — I739 Peripheral vascular disease, unspecified: Secondary | ICD-10-CM | POA: Diagnosis not present

## 2018-09-24 DIAGNOSIS — L84 Corns and callosities: Secondary | ICD-10-CM | POA: Diagnosis not present

## 2018-09-24 DIAGNOSIS — E1151 Type 2 diabetes mellitus with diabetic peripheral angiopathy without gangrene: Secondary | ICD-10-CM | POA: Diagnosis not present

## 2018-09-24 DIAGNOSIS — L603 Nail dystrophy: Secondary | ICD-10-CM | POA: Diagnosis not present

## 2018-09-25 DIAGNOSIS — G894 Chronic pain syndrome: Secondary | ICD-10-CM | POA: Diagnosis not present

## 2018-09-25 DIAGNOSIS — M79671 Pain in right foot: Secondary | ICD-10-CM | POA: Diagnosis not present

## 2018-09-25 DIAGNOSIS — G8929 Other chronic pain: Secondary | ICD-10-CM | POA: Diagnosis not present

## 2018-09-25 DIAGNOSIS — Z79891 Long term (current) use of opiate analgesic: Secondary | ICD-10-CM | POA: Diagnosis not present

## 2018-09-25 DIAGNOSIS — M79641 Pain in right hand: Secondary | ICD-10-CM | POA: Diagnosis not present

## 2018-09-25 DIAGNOSIS — M25512 Pain in left shoulder: Secondary | ICD-10-CM | POA: Diagnosis not present

## 2018-09-25 DIAGNOSIS — M25511 Pain in right shoulder: Secondary | ICD-10-CM | POA: Diagnosis not present

## 2018-09-25 DIAGNOSIS — M79672 Pain in left foot: Secondary | ICD-10-CM | POA: Diagnosis not present

## 2018-09-26 DIAGNOSIS — I119 Hypertensive heart disease without heart failure: Secondary | ICD-10-CM | POA: Diagnosis not present

## 2018-09-26 DIAGNOSIS — J45909 Unspecified asthma, uncomplicated: Secondary | ICD-10-CM | POA: Diagnosis not present

## 2018-09-26 DIAGNOSIS — G629 Polyneuropathy, unspecified: Secondary | ICD-10-CM | POA: Diagnosis not present

## 2018-09-26 DIAGNOSIS — E119 Type 2 diabetes mellitus without complications: Secondary | ICD-10-CM | POA: Diagnosis not present

## 2018-09-26 DIAGNOSIS — I10 Essential (primary) hypertension: Secondary | ICD-10-CM | POA: Diagnosis not present

## 2018-10-29 ENCOUNTER — Other Ambulatory Visit: Payer: Self-pay

## 2018-10-29 DIAGNOSIS — Z79891 Long term (current) use of opiate analgesic: Secondary | ICD-10-CM | POA: Diagnosis not present

## 2018-10-29 DIAGNOSIS — M542 Cervicalgia: Secondary | ICD-10-CM | POA: Diagnosis not present

## 2018-10-29 DIAGNOSIS — M25512 Pain in left shoulder: Secondary | ICD-10-CM | POA: Diagnosis not present

## 2018-10-29 DIAGNOSIS — G8929 Other chronic pain: Secondary | ICD-10-CM | POA: Diagnosis not present

## 2018-10-29 DIAGNOSIS — G894 Chronic pain syndrome: Secondary | ICD-10-CM | POA: Diagnosis not present

## 2018-10-29 DIAGNOSIS — M25511 Pain in right shoulder: Secondary | ICD-10-CM | POA: Diagnosis not present

## 2018-10-29 DIAGNOSIS — M79672 Pain in left foot: Secondary | ICD-10-CM | POA: Diagnosis not present

## 2018-10-29 DIAGNOSIS — M79671 Pain in right foot: Secondary | ICD-10-CM | POA: Diagnosis not present

## 2018-10-29 NOTE — Patient Outreach (Signed)
[facility]) Care Management  10/29/2018  Raylyn Carton Sep 15, 1970 [id number]   Medication Adherence call to Mrs. Tamea Capozzi HIPPA Compliant Voice message left with a call back number.Mrs. Schlink is showing past due on Olmesartan 40 mg under [place].   [place] Management Direct Dial [phone]  Fax [phone] [email or web address]

## 2018-11-03 DIAGNOSIS — M79672 Pain in left foot: Secondary | ICD-10-CM | POA: Diagnosis not present

## 2018-11-03 DIAGNOSIS — M25572 Pain in left ankle and joints of left foot: Secondary | ICD-10-CM | POA: Diagnosis not present

## 2018-11-03 DIAGNOSIS — I10 Essential (primary) hypertension: Secondary | ICD-10-CM | POA: Diagnosis not present

## 2018-11-03 DIAGNOSIS — J45909 Unspecified asthma, uncomplicated: Secondary | ICD-10-CM | POA: Diagnosis not present

## 2018-11-03 DIAGNOSIS — Z79899 Other long term (current) drug therapy: Secondary | ICD-10-CM | POA: Diagnosis not present

## 2018-11-03 DIAGNOSIS — E119 Type 2 diabetes mellitus without complications: Secondary | ICD-10-CM | POA: Diagnosis not present

## 2018-11-03 DIAGNOSIS — S92242A Displaced fracture of medial cuneiform of left foot, initial encounter for closed fracture: Secondary | ICD-10-CM | POA: Diagnosis not present

## 2018-11-03 DIAGNOSIS — R0789 Other chest pain: Secondary | ICD-10-CM | POA: Diagnosis not present

## 2018-11-03 DIAGNOSIS — Z87891 Personal history of nicotine dependence: Secondary | ICD-10-CM | POA: Diagnosis not present

## 2018-11-05 DIAGNOSIS — E119 Type 2 diabetes mellitus without complications: Secondary | ICD-10-CM | POA: Diagnosis not present

## 2018-11-05 DIAGNOSIS — Z01812 Encounter for preprocedural laboratory examination: Secondary | ICD-10-CM | POA: Diagnosis not present

## 2018-11-05 DIAGNOSIS — M7989 Other specified soft tissue disorders: Secondary | ICD-10-CM | POA: Diagnosis not present

## 2018-11-14 DIAGNOSIS — J45909 Unspecified asthma, uncomplicated: Secondary | ICD-10-CM | POA: Diagnosis not present

## 2018-11-14 DIAGNOSIS — I119 Hypertensive heart disease without heart failure: Secondary | ICD-10-CM | POA: Diagnosis not present

## 2018-11-14 DIAGNOSIS — G629 Polyneuropathy, unspecified: Secondary | ICD-10-CM | POA: Diagnosis not present

## 2018-11-14 DIAGNOSIS — E119 Type 2 diabetes mellitus without complications: Secondary | ICD-10-CM | POA: Diagnosis not present

## 2018-11-14 DIAGNOSIS — I10 Essential (primary) hypertension: Secondary | ICD-10-CM | POA: Diagnosis not present

## 2018-11-17 DIAGNOSIS — E119 Type 2 diabetes mellitus without complications: Secondary | ICD-10-CM | POA: Diagnosis not present

## 2018-11-17 DIAGNOSIS — I119 Hypertensive heart disease without heart failure: Secondary | ICD-10-CM | POA: Diagnosis not present

## 2018-11-17 DIAGNOSIS — I10 Essential (primary) hypertension: Secondary | ICD-10-CM | POA: Diagnosis not present

## 2018-11-17 DIAGNOSIS — J45909 Unspecified asthma, uncomplicated: Secondary | ICD-10-CM | POA: Diagnosis not present

## 2018-11-17 DIAGNOSIS — Z0000 Encounter for general adult medical examination without abnormal findings: Secondary | ICD-10-CM | POA: Diagnosis not present

## 2018-11-20 DIAGNOSIS — M7989 Other specified soft tissue disorders: Secondary | ICD-10-CM | POA: Diagnosis not present

## 2018-11-20 DIAGNOSIS — T8484XA Pain due to internal orthopedic prosthetic devices, implants and grafts, initial encounter: Secondary | ICD-10-CM | POA: Diagnosis not present

## 2018-11-24 DIAGNOSIS — M7989 Other specified soft tissue disorders: Secondary | ICD-10-CM | POA: Diagnosis not present

## 2018-11-26 DIAGNOSIS — G8929 Other chronic pain: Secondary | ICD-10-CM | POA: Diagnosis not present

## 2018-11-26 DIAGNOSIS — Z79891 Long term (current) use of opiate analgesic: Secondary | ICD-10-CM | POA: Diagnosis not present

## 2018-11-26 DIAGNOSIS — M545 Low back pain: Secondary | ICD-10-CM | POA: Diagnosis not present

## 2018-11-26 DIAGNOSIS — M25551 Pain in right hip: Secondary | ICD-10-CM | POA: Diagnosis not present

## 2018-11-26 DIAGNOSIS — G894 Chronic pain syndrome: Secondary | ICD-10-CM | POA: Diagnosis not present

## 2018-11-26 DIAGNOSIS — M25512 Pain in left shoulder: Secondary | ICD-10-CM | POA: Diagnosis not present

## 2018-11-26 DIAGNOSIS — M542 Cervicalgia: Secondary | ICD-10-CM | POA: Diagnosis not present

## 2018-11-26 DIAGNOSIS — M25511 Pain in right shoulder: Secondary | ICD-10-CM | POA: Diagnosis not present

## 2018-12-09 DIAGNOSIS — M7989 Other specified soft tissue disorders: Secondary | ICD-10-CM | POA: Diagnosis not present

## 2018-12-16 DIAGNOSIS — G4733 Obstructive sleep apnea (adult) (pediatric): Secondary | ICD-10-CM | POA: Diagnosis not present

## 2018-12-24 DIAGNOSIS — G8929 Other chronic pain: Secondary | ICD-10-CM | POA: Diagnosis not present

## 2018-12-24 DIAGNOSIS — M542 Cervicalgia: Secondary | ICD-10-CM | POA: Diagnosis not present

## 2018-12-24 DIAGNOSIS — M25511 Pain in right shoulder: Secondary | ICD-10-CM | POA: Diagnosis not present

## 2018-12-24 DIAGNOSIS — G894 Chronic pain syndrome: Secondary | ICD-10-CM | POA: Diagnosis not present

## 2018-12-24 DIAGNOSIS — M79672 Pain in left foot: Secondary | ICD-10-CM | POA: Diagnosis not present

## 2018-12-24 DIAGNOSIS — M25512 Pain in left shoulder: Secondary | ICD-10-CM | POA: Diagnosis not present

## 2018-12-24 DIAGNOSIS — M79671 Pain in right foot: Secondary | ICD-10-CM | POA: Diagnosis not present

## 2018-12-24 DIAGNOSIS — Z79891 Long term (current) use of opiate analgesic: Secondary | ICD-10-CM | POA: Diagnosis not present

## 2018-12-31 DIAGNOSIS — L84 Corns and callosities: Secondary | ICD-10-CM | POA: Diagnosis not present

## 2018-12-31 DIAGNOSIS — L603 Nail dystrophy: Secondary | ICD-10-CM | POA: Diagnosis not present

## 2018-12-31 DIAGNOSIS — E1142 Type 2 diabetes mellitus with diabetic polyneuropathy: Secondary | ICD-10-CM | POA: Diagnosis not present

## 2018-12-31 DIAGNOSIS — I739 Peripheral vascular disease, unspecified: Secondary | ICD-10-CM | POA: Diagnosis not present

## 2019-01-08 ENCOUNTER — Other Ambulatory Visit: Payer: Self-pay

## 2019-01-08 NOTE — Patient Outreach (Signed)
[facility]) Care Management  01/08/2019  Diana Henderson [birth year] [id number]   Medication Adherence call to Mrs. Diana Henderson [employer] Voice message left with a call back number. Diana Henderson is showing past due on Trulicity under [place].   [place] Management Direct Dial [phone]  Fax [phone] [email or web address]

## 2019-01-19 DIAGNOSIS — M79671 Pain in right foot: Secondary | ICD-10-CM | POA: Diagnosis not present

## 2019-01-19 DIAGNOSIS — Z79891 Long term (current) use of opiate analgesic: Secondary | ICD-10-CM | POA: Diagnosis not present

## 2019-01-19 DIAGNOSIS — I10 Essential (primary) hypertension: Secondary | ICD-10-CM | POA: Diagnosis not present

## 2019-01-19 DIAGNOSIS — M25511 Pain in right shoulder: Secondary | ICD-10-CM | POA: Diagnosis not present

## 2019-01-19 DIAGNOSIS — E119 Type 2 diabetes mellitus without complications: Secondary | ICD-10-CM | POA: Diagnosis not present

## 2019-01-19 DIAGNOSIS — G629 Polyneuropathy, unspecified: Secondary | ICD-10-CM | POA: Diagnosis not present

## 2019-01-19 DIAGNOSIS — M79642 Pain in left hand: Secondary | ICD-10-CM | POA: Diagnosis not present

## 2019-01-19 DIAGNOSIS — M79641 Pain in right hand: Secondary | ICD-10-CM | POA: Diagnosis not present

## 2019-01-19 DIAGNOSIS — I119 Hypertensive heart disease without heart failure: Secondary | ICD-10-CM | POA: Diagnosis not present

## 2019-01-19 DIAGNOSIS — J45909 Unspecified asthma, uncomplicated: Secondary | ICD-10-CM | POA: Diagnosis not present

## 2019-01-19 DIAGNOSIS — M79672 Pain in left foot: Secondary | ICD-10-CM | POA: Diagnosis not present

## 2019-01-19 DIAGNOSIS — G894 Chronic pain syndrome: Secondary | ICD-10-CM | POA: Diagnosis not present

## 2019-01-19 DIAGNOSIS — G8929 Other chronic pain: Secondary | ICD-10-CM | POA: Diagnosis not present

## 2019-01-29 ENCOUNTER — Other Ambulatory Visit: Payer: Self-pay

## 2019-01-29 NOTE — Patient Outreach (Signed)
[facility]) Care Management  01/29/2019  Fartun Paradiso 03/23/1971 [id number]   Medication Adherence call to Mrs. Armani Bless HIPPA Compliant Voice message left with a call back number. Mrs. Chrostowski is showing past due on Olmesartan 40 mg under [place].   [place] Management Direct Dial [phone]  Fax [phone] [email or web address]

## 2019-02-19 ENCOUNTER — Other Ambulatory Visit: Payer: Self-pay

## 2019-02-19 NOTE — Patient Outreach (Signed)
[facility]) Care Management  02/19/2019  Claresa Canale January 03, 1971 [id number]   Medication Adherence call to Mrs. Darnisha Hjort patient did not answer patient is past due on Olmesartan under [place].   [place] Management Direct Dial [phone]  Fax [phone] [email or web address]

## 2019-02-20 DIAGNOSIS — G8929 Other chronic pain: Secondary | ICD-10-CM | POA: Diagnosis not present

## 2019-02-20 DIAGNOSIS — G894 Chronic pain syndrome: Secondary | ICD-10-CM | POA: Diagnosis not present

## 2019-02-20 DIAGNOSIS — Z79891 Long term (current) use of opiate analgesic: Secondary | ICD-10-CM | POA: Diagnosis not present

## 2019-02-20 DIAGNOSIS — M25512 Pain in left shoulder: Secondary | ICD-10-CM | POA: Diagnosis not present

## 2019-02-20 DIAGNOSIS — M25561 Pain in right knee: Secondary | ICD-10-CM | POA: Diagnosis not present

## 2019-02-20 DIAGNOSIS — M542 Cervicalgia: Secondary | ICD-10-CM | POA: Diagnosis not present

## 2019-02-20 DIAGNOSIS — M25511 Pain in right shoulder: Secondary | ICD-10-CM | POA: Diagnosis not present

## 2019-02-20 DIAGNOSIS — M545 Low back pain: Secondary | ICD-10-CM | POA: Diagnosis not present

## 2019-03-04 DIAGNOSIS — E119 Type 2 diabetes mellitus without complications: Secondary | ICD-10-CM | POA: Diagnosis not present

## 2019-03-04 DIAGNOSIS — I119 Hypertensive heart disease without heart failure: Secondary | ICD-10-CM | POA: Diagnosis not present

## 2019-03-04 DIAGNOSIS — J45909 Unspecified asthma, uncomplicated: Secondary | ICD-10-CM | POA: Diagnosis not present

## 2019-03-04 DIAGNOSIS — G629 Polyneuropathy, unspecified: Secondary | ICD-10-CM | POA: Diagnosis not present

## 2019-03-04 DIAGNOSIS — I10 Essential (primary) hypertension: Secondary | ICD-10-CM | POA: Diagnosis not present

## 2019-03-11 DIAGNOSIS — J45909 Unspecified asthma, uncomplicated: Secondary | ICD-10-CM | POA: Diagnosis not present

## 2019-03-11 DIAGNOSIS — I10 Essential (primary) hypertension: Secondary | ICD-10-CM | POA: Diagnosis not present

## 2019-03-11 DIAGNOSIS — I119 Hypertensive heart disease without heart failure: Secondary | ICD-10-CM | POA: Diagnosis not present

## 2019-03-11 DIAGNOSIS — G629 Polyneuropathy, unspecified: Secondary | ICD-10-CM | POA: Diagnosis not present

## 2019-03-11 DIAGNOSIS — E119 Type 2 diabetes mellitus without complications: Secondary | ICD-10-CM | POA: Diagnosis not present

## 2019-03-17 DIAGNOSIS — G4733 Obstructive sleep apnea (adult) (pediatric): Secondary | ICD-10-CM | POA: Diagnosis not present

## 2019-03-18 DIAGNOSIS — Z79891 Long term (current) use of opiate analgesic: Secondary | ICD-10-CM | POA: Diagnosis not present

## 2019-03-18 DIAGNOSIS — M25512 Pain in left shoulder: Secondary | ICD-10-CM | POA: Diagnosis not present

## 2019-03-18 DIAGNOSIS — M79671 Pain in right foot: Secondary | ICD-10-CM | POA: Diagnosis not present

## 2019-03-18 DIAGNOSIS — G8929 Other chronic pain: Secondary | ICD-10-CM | POA: Diagnosis not present

## 2019-03-18 DIAGNOSIS — M25511 Pain in right shoulder: Secondary | ICD-10-CM | POA: Diagnosis not present

## 2019-03-18 DIAGNOSIS — M79672 Pain in left foot: Secondary | ICD-10-CM | POA: Diagnosis not present

## 2019-03-18 DIAGNOSIS — M542 Cervicalgia: Secondary | ICD-10-CM | POA: Diagnosis not present

## 2019-03-18 DIAGNOSIS — G894 Chronic pain syndrome: Secondary | ICD-10-CM | POA: Diagnosis not present

## 2019-04-02 ENCOUNTER — Other Ambulatory Visit: Payer: Self-pay | Admitting: Internal Medicine

## 2019-04-02 DIAGNOSIS — Z1231 Encounter for screening mammogram for malignant neoplasm of breast: Secondary | ICD-10-CM

## 2019-04-06 DIAGNOSIS — Z1211 Encounter for screening for malignant neoplasm of colon: Secondary | ICD-10-CM | POA: Diagnosis not present

## 2019-04-06 DIAGNOSIS — I10 Essential (primary) hypertension: Secondary | ICD-10-CM | POA: Diagnosis not present

## 2019-04-06 DIAGNOSIS — E1165 Type 2 diabetes mellitus with hyperglycemia: Secondary | ICD-10-CM | POA: Diagnosis not present

## 2019-04-16 DIAGNOSIS — M542 Cervicalgia: Secondary | ICD-10-CM | POA: Diagnosis not present

## 2019-04-16 DIAGNOSIS — G894 Chronic pain syndrome: Secondary | ICD-10-CM | POA: Diagnosis not present

## 2019-04-16 DIAGNOSIS — G8929 Other chronic pain: Secondary | ICD-10-CM | POA: Diagnosis not present

## 2019-04-16 DIAGNOSIS — M25511 Pain in right shoulder: Secondary | ICD-10-CM | POA: Diagnosis not present

## 2019-04-16 DIAGNOSIS — M79671 Pain in right foot: Secondary | ICD-10-CM | POA: Diagnosis not present

## 2019-04-16 DIAGNOSIS — M79672 Pain in left foot: Secondary | ICD-10-CM | POA: Diagnosis not present

## 2019-04-16 DIAGNOSIS — Z79891 Long term (current) use of opiate analgesic: Secondary | ICD-10-CM | POA: Diagnosis not present

## 2019-04-16 DIAGNOSIS — M25512 Pain in left shoulder: Secondary | ICD-10-CM | POA: Diagnosis not present

## 2019-05-01 DIAGNOSIS — M7989 Other specified soft tissue disorders: Secondary | ICD-10-CM | POA: Diagnosis not present

## 2019-05-01 DIAGNOSIS — M722 Plantar fascial fibromatosis: Secondary | ICD-10-CM | POA: Diagnosis not present

## 2019-05-13 DIAGNOSIS — M79672 Pain in left foot: Secondary | ICD-10-CM | POA: Diagnosis not present

## 2019-05-13 DIAGNOSIS — G8929 Other chronic pain: Secondary | ICD-10-CM | POA: Diagnosis not present

## 2019-05-13 DIAGNOSIS — M79671 Pain in right foot: Secondary | ICD-10-CM | POA: Diagnosis not present

## 2019-05-13 DIAGNOSIS — Z79891 Long term (current) use of opiate analgesic: Secondary | ICD-10-CM | POA: Diagnosis not present

## 2019-05-13 DIAGNOSIS — M25511 Pain in right shoulder: Secondary | ICD-10-CM | POA: Diagnosis not present

## 2019-05-13 DIAGNOSIS — G894 Chronic pain syndrome: Secondary | ICD-10-CM | POA: Diagnosis not present

## 2019-05-13 DIAGNOSIS — M542 Cervicalgia: Secondary | ICD-10-CM | POA: Diagnosis not present

## 2019-05-18 ENCOUNTER — Other Ambulatory Visit: Payer: Self-pay

## 2019-05-18 NOTE — Patient Outreach (Signed)
[facility]) Care Management  05/18/2019  Diana Henderson 1970-08-03 [id number]   Medication Adherence call to [name] HIPPA Compliant Voice message left with a call back number.  [name] is showing past due under [place].  [place] Management Direct Dial [phone]  Fax [phone] [email or web address]

## 2019-05-21 ENCOUNTER — Ambulatory Visit: Payer: Medicare Other

## 2019-06-10 DIAGNOSIS — M79671 Pain in right foot: Secondary | ICD-10-CM | POA: Diagnosis not present

## 2019-06-10 DIAGNOSIS — M25511 Pain in right shoulder: Secondary | ICD-10-CM | POA: Diagnosis not present

## 2019-06-10 DIAGNOSIS — M79672 Pain in left foot: Secondary | ICD-10-CM | POA: Diagnosis not present

## 2019-06-10 DIAGNOSIS — M542 Cervicalgia: Secondary | ICD-10-CM | POA: Diagnosis not present

## 2019-06-10 DIAGNOSIS — G8929 Other chronic pain: Secondary | ICD-10-CM | POA: Diagnosis not present

## 2019-06-10 DIAGNOSIS — G894 Chronic pain syndrome: Secondary | ICD-10-CM | POA: Diagnosis not present

## 2019-06-10 DIAGNOSIS — M25512 Pain in left shoulder: Secondary | ICD-10-CM | POA: Diagnosis not present

## 2019-06-10 DIAGNOSIS — Z79891 Long term (current) use of opiate analgesic: Secondary | ICD-10-CM | POA: Diagnosis not present

## 2019-06-22 DIAGNOSIS — L603 Nail dystrophy: Secondary | ICD-10-CM | POA: Diagnosis not present

## 2019-06-22 DIAGNOSIS — I739 Peripheral vascular disease, unspecified: Secondary | ICD-10-CM | POA: Diagnosis not present

## 2019-06-22 DIAGNOSIS — L84 Corns and callosities: Secondary | ICD-10-CM | POA: Diagnosis not present

## 2019-06-22 DIAGNOSIS — E1151 Type 2 diabetes mellitus with diabetic peripheral angiopathy without gangrene: Secondary | ICD-10-CM | POA: Diagnosis not present

## 2019-07-04 DIAGNOSIS — G4733 Obstructive sleep apnea (adult) (pediatric): Secondary | ICD-10-CM | POA: Diagnosis not present

## 2019-07-10 ENCOUNTER — Ambulatory Visit: Payer: Medicare Other

## 2019-07-13 DIAGNOSIS — M25571 Pain in right ankle and joints of right foot: Secondary | ICD-10-CM | POA: Diagnosis not present

## 2019-07-13 DIAGNOSIS — M19072 Primary osteoarthritis, left ankle and foot: Secondary | ICD-10-CM | POA: Diagnosis not present

## 2019-07-13 DIAGNOSIS — M19071 Primary osteoarthritis, right ankle and foot: Secondary | ICD-10-CM | POA: Diagnosis not present

## 2019-07-13 DIAGNOSIS — M65871 Other synovitis and tenosynovitis, right ankle and foot: Secondary | ICD-10-CM | POA: Diagnosis not present

## 2019-07-30 DIAGNOSIS — G894 Chronic pain syndrome: Secondary | ICD-10-CM | POA: Diagnosis not present

## 2019-07-30 DIAGNOSIS — M79672 Pain in left foot: Secondary | ICD-10-CM | POA: Diagnosis not present

## 2019-07-30 DIAGNOSIS — G8929 Other chronic pain: Secondary | ICD-10-CM | POA: Diagnosis not present

## 2019-07-30 DIAGNOSIS — M542 Cervicalgia: Secondary | ICD-10-CM | POA: Diagnosis not present

## 2019-07-30 DIAGNOSIS — M25511 Pain in right shoulder: Secondary | ICD-10-CM | POA: Diagnosis not present

## 2019-07-30 DIAGNOSIS — M79671 Pain in right foot: Secondary | ICD-10-CM | POA: Diagnosis not present

## 2019-07-30 DIAGNOSIS — Z79891 Long term (current) use of opiate analgesic: Secondary | ICD-10-CM | POA: Diagnosis not present

## 2019-07-30 DIAGNOSIS — M5417 Radiculopathy, lumbosacral region: Secondary | ICD-10-CM | POA: Diagnosis not present

## 2019-07-31 DIAGNOSIS — I119 Hypertensive heart disease without heart failure: Secondary | ICD-10-CM | POA: Diagnosis not present

## 2019-07-31 DIAGNOSIS — I10 Essential (primary) hypertension: Secondary | ICD-10-CM | POA: Diagnosis not present

## 2019-07-31 DIAGNOSIS — J45909 Unspecified asthma, uncomplicated: Secondary | ICD-10-CM | POA: Diagnosis not present

## 2019-07-31 DIAGNOSIS — G629 Polyneuropathy, unspecified: Secondary | ICD-10-CM | POA: Diagnosis not present

## 2019-07-31 DIAGNOSIS — E119 Type 2 diabetes mellitus without complications: Secondary | ICD-10-CM | POA: Diagnosis not present

## 2019-08-18 ENCOUNTER — Ambulatory Visit: Payer: Medicare HMO

## 2019-08-18 ENCOUNTER — Other Ambulatory Visit: Payer: Self-pay

## 2019-08-18 ENCOUNTER — Ambulatory Visit
Admission: RE | Admit: 2019-08-18 | Discharge: 2019-08-18 | Disposition: A | Discharge status: Home / Self Care | Payer: Medicare Other | Source: Ambulatory Visit | Attending: Internal Medicine | Admitting: Internal Medicine

## 2019-08-18 DIAGNOSIS — Z1231 Encounter for screening mammogram for malignant neoplasm of breast: Secondary | ICD-10-CM

## 2019-08-20 DIAGNOSIS — G629 Polyneuropathy, unspecified: Secondary | ICD-10-CM | POA: Diagnosis not present

## 2019-08-20 DIAGNOSIS — J45909 Unspecified asthma, uncomplicated: Secondary | ICD-10-CM | POA: Diagnosis not present

## 2019-08-20 DIAGNOSIS — I119 Hypertensive heart disease without heart failure: Secondary | ICD-10-CM | POA: Diagnosis not present

## 2019-08-20 DIAGNOSIS — I10 Essential (primary) hypertension: Secondary | ICD-10-CM | POA: Diagnosis not present

## 2019-08-20 DIAGNOSIS — E119 Type 2 diabetes mellitus without complications: Secondary | ICD-10-CM | POA: Diagnosis not present

## 2019-08-24 DIAGNOSIS — J45909 Unspecified asthma, uncomplicated: Secondary | ICD-10-CM | POA: Diagnosis not present

## 2019-08-24 DIAGNOSIS — G629 Polyneuropathy, unspecified: Secondary | ICD-10-CM | POA: Diagnosis not present

## 2019-08-24 DIAGNOSIS — E119 Type 2 diabetes mellitus without complications: Secondary | ICD-10-CM | POA: Diagnosis not present

## 2019-08-24 DIAGNOSIS — I119 Hypertensive heart disease without heart failure: Secondary | ICD-10-CM | POA: Diagnosis not present

## 2019-08-24 DIAGNOSIS — I10 Essential (primary) hypertension: Secondary | ICD-10-CM | POA: Diagnosis not present

## 2019-08-25 DIAGNOSIS — M7750 Other enthesopathy of unspecified foot: Secondary | ICD-10-CM | POA: Diagnosis not present

## 2019-08-25 DIAGNOSIS — M6701 Short Achilles tendon (acquired), right ankle: Secondary | ICD-10-CM | POA: Insufficient documentation

## 2019-08-25 DIAGNOSIS — E1142 Type 2 diabetes mellitus with diabetic polyneuropathy: Secondary | ICD-10-CM | POA: Insufficient documentation

## 2019-08-25 DIAGNOSIS — M722 Plantar fascial fibromatosis: Secondary | ICD-10-CM | POA: Diagnosis not present

## 2019-08-25 DIAGNOSIS — B353 Tinea pedis: Secondary | ICD-10-CM | POA: Diagnosis not present

## 2019-08-27 DIAGNOSIS — G8929 Other chronic pain: Secondary | ICD-10-CM | POA: Diagnosis not present

## 2019-08-27 DIAGNOSIS — M25512 Pain in left shoulder: Secondary | ICD-10-CM | POA: Diagnosis not present

## 2019-08-27 DIAGNOSIS — M25511 Pain in right shoulder: Secondary | ICD-10-CM | POA: Diagnosis not present

## 2019-08-27 DIAGNOSIS — M79671 Pain in right foot: Secondary | ICD-10-CM | POA: Diagnosis not present

## 2019-08-27 DIAGNOSIS — G894 Chronic pain syndrome: Secondary | ICD-10-CM | POA: Diagnosis not present

## 2019-08-27 DIAGNOSIS — M79672 Pain in left foot: Secondary | ICD-10-CM | POA: Diagnosis not present

## 2019-08-27 DIAGNOSIS — Z79891 Long term (current) use of opiate analgesic: Secondary | ICD-10-CM | POA: Diagnosis not present

## 2019-08-27 DIAGNOSIS — M542 Cervicalgia: Secondary | ICD-10-CM | POA: Diagnosis not present

## 2019-09-20 DIAGNOSIS — R1011 Right upper quadrant pain: Secondary | ICD-10-CM | POA: Diagnosis not present

## 2019-09-20 DIAGNOSIS — R1010 Upper abdominal pain, unspecified: Secondary | ICD-10-CM | POA: Diagnosis not present

## 2019-09-20 DIAGNOSIS — R109 Unspecified abdominal pain: Secondary | ICD-10-CM | POA: Diagnosis not present

## 2019-09-21 DIAGNOSIS — R109 Unspecified abdominal pain: Secondary | ICD-10-CM | POA: Diagnosis not present

## 2019-09-25 DIAGNOSIS — M25511 Pain in right shoulder: Secondary | ICD-10-CM | POA: Diagnosis not present

## 2019-09-25 DIAGNOSIS — M25512 Pain in left shoulder: Secondary | ICD-10-CM | POA: Diagnosis not present

## 2019-09-25 DIAGNOSIS — Z79891 Long term (current) use of opiate analgesic: Secondary | ICD-10-CM | POA: Diagnosis not present

## 2019-09-25 DIAGNOSIS — M79672 Pain in left foot: Secondary | ICD-10-CM | POA: Diagnosis not present

## 2019-09-25 DIAGNOSIS — M79671 Pain in right foot: Secondary | ICD-10-CM | POA: Diagnosis not present

## 2019-09-25 DIAGNOSIS — G8929 Other chronic pain: Secondary | ICD-10-CM | POA: Diagnosis not present

## 2019-09-25 DIAGNOSIS — G894 Chronic pain syndrome: Secondary | ICD-10-CM | POA: Diagnosis not present

## 2019-09-25 DIAGNOSIS — M542 Cervicalgia: Secondary | ICD-10-CM | POA: Diagnosis not present

## 2019-10-01 DIAGNOSIS — D62 Acute posthemorrhagic anemia: Secondary | ICD-10-CM | POA: Diagnosis not present

## 2019-10-01 DIAGNOSIS — K922 Gastrointestinal hemorrhage, unspecified: Secondary | ICD-10-CM | POA: Diagnosis not present

## 2019-10-01 DIAGNOSIS — I10 Essential (primary) hypertension: Secondary | ICD-10-CM | POA: Diagnosis not present

## 2019-10-01 DIAGNOSIS — R55 Syncope and collapse: Secondary | ICD-10-CM | POA: Diagnosis not present

## 2019-10-01 DIAGNOSIS — I4891 Unspecified atrial fibrillation: Secondary | ICD-10-CM | POA: Diagnosis not present

## 2019-10-02 DIAGNOSIS — R55 Syncope and collapse: Secondary | ICD-10-CM | POA: Diagnosis not present

## 2019-10-02 DIAGNOSIS — K922 Gastrointestinal hemorrhage, unspecified: Secondary | ICD-10-CM | POA: Diagnosis not present

## 2019-10-02 DIAGNOSIS — D62 Acute posthemorrhagic anemia: Secondary | ICD-10-CM | POA: Diagnosis not present

## 2019-10-02 DIAGNOSIS — I4891 Unspecified atrial fibrillation: Secondary | ICD-10-CM | POA: Diagnosis not present

## 2019-10-02 DIAGNOSIS — I10 Essential (primary) hypertension: Secondary | ICD-10-CM | POA: Diagnosis not present

## 2019-10-03 DIAGNOSIS — D62 Acute posthemorrhagic anemia: Secondary | ICD-10-CM | POA: Diagnosis not present

## 2019-10-03 DIAGNOSIS — R55 Syncope and collapse: Secondary | ICD-10-CM | POA: Diagnosis not present

## 2019-10-03 DIAGNOSIS — I4891 Unspecified atrial fibrillation: Secondary | ICD-10-CM | POA: Diagnosis not present

## 2019-10-03 DIAGNOSIS — K922 Gastrointestinal hemorrhage, unspecified: Secondary | ICD-10-CM | POA: Diagnosis not present

## 2019-10-03 DIAGNOSIS — I10 Essential (primary) hypertension: Secondary | ICD-10-CM | POA: Diagnosis not present

## 2019-10-04 DIAGNOSIS — K922 Gastrointestinal hemorrhage, unspecified: Secondary | ICD-10-CM | POA: Diagnosis not present

## 2019-10-04 DIAGNOSIS — R55 Syncope and collapse: Secondary | ICD-10-CM | POA: Diagnosis not present

## 2019-10-04 DIAGNOSIS — I4891 Unspecified atrial fibrillation: Secondary | ICD-10-CM | POA: Diagnosis not present

## 2019-10-04 DIAGNOSIS — I10 Essential (primary) hypertension: Secondary | ICD-10-CM | POA: Diagnosis not present

## 2019-10-04 DIAGNOSIS — D62 Acute posthemorrhagic anemia: Secondary | ICD-10-CM | POA: Diagnosis not present

## 2019-10-05 DIAGNOSIS — E119 Type 2 diabetes mellitus without complications: Secondary | ICD-10-CM | POA: Diagnosis not present

## 2019-10-05 DIAGNOSIS — I10 Essential (primary) hypertension: Secondary | ICD-10-CM | POA: Diagnosis not present

## 2019-10-05 DIAGNOSIS — K922 Gastrointestinal hemorrhage, unspecified: Secondary | ICD-10-CM | POA: Diagnosis not present

## 2019-10-05 DIAGNOSIS — I4891 Unspecified atrial fibrillation: Secondary | ICD-10-CM | POA: Diagnosis not present

## 2019-10-05 DIAGNOSIS — G629 Polyneuropathy, unspecified: Secondary | ICD-10-CM | POA: Diagnosis not present

## 2019-10-05 DIAGNOSIS — J45909 Unspecified asthma, uncomplicated: Secondary | ICD-10-CM | POA: Diagnosis not present

## 2019-10-05 DIAGNOSIS — R55 Syncope and collapse: Secondary | ICD-10-CM | POA: Diagnosis not present

## 2019-10-05 DIAGNOSIS — I119 Hypertensive heart disease without heart failure: Secondary | ICD-10-CM | POA: Diagnosis not present

## 2019-10-05 DIAGNOSIS — D62 Acute posthemorrhagic anemia: Secondary | ICD-10-CM | POA: Diagnosis not present

## 2019-10-08 DIAGNOSIS — G4733 Obstructive sleep apnea (adult) (pediatric): Secondary | ICD-10-CM | POA: Diagnosis not present

## 2019-10-23 DIAGNOSIS — M542 Cervicalgia: Secondary | ICD-10-CM | POA: Diagnosis not present

## 2019-10-23 DIAGNOSIS — M5417 Radiculopathy, lumbosacral region: Secondary | ICD-10-CM | POA: Diagnosis not present

## 2019-10-23 DIAGNOSIS — G894 Chronic pain syndrome: Secondary | ICD-10-CM | POA: Diagnosis not present

## 2019-10-23 DIAGNOSIS — M79672 Pain in left foot: Secondary | ICD-10-CM | POA: Diagnosis not present

## 2019-10-23 DIAGNOSIS — Z79891 Long term (current) use of opiate analgesic: Secondary | ICD-10-CM | POA: Diagnosis not present

## 2019-10-23 DIAGNOSIS — G8929 Other chronic pain: Secondary | ICD-10-CM | POA: Diagnosis not present

## 2019-10-23 DIAGNOSIS — M79671 Pain in right foot: Secondary | ICD-10-CM | POA: Diagnosis not present

## 2019-10-26 ENCOUNTER — Ambulatory Visit: Payer: Medicare Other | Admitting: Podiatry

## 2019-10-26 ENCOUNTER — Other Ambulatory Visit: Payer: Self-pay

## 2019-11-02 ENCOUNTER — Other Ambulatory Visit: Payer: Self-pay

## 2019-11-02 ENCOUNTER — Ambulatory Visit (INDEPENDENT_AMBULATORY_CARE_PROVIDER_SITE_OTHER): Payer: Medicare Other | Admitting: Podiatry

## 2019-11-02 ENCOUNTER — Ambulatory Visit (INDEPENDENT_AMBULATORY_CARE_PROVIDER_SITE_OTHER): Payer: Medicare Other

## 2019-11-02 ENCOUNTER — Encounter: Payer: Self-pay | Admitting: Podiatry

## 2019-11-02 DIAGNOSIS — M2062 Acquired deformities of toe(s), unspecified, left foot: Secondary | ICD-10-CM

## 2019-11-02 DIAGNOSIS — M722 Plantar fascial fibromatosis: Secondary | ICD-10-CM | POA: Diagnosis not present

## 2019-11-02 DIAGNOSIS — M2060 Acquired deformities of toe(s), unspecified, unspecified foot: Secondary | ICD-10-CM | POA: Diagnosis not present

## 2019-11-02 DIAGNOSIS — M76822 Posterior tibial tendinitis, left leg: Secondary | ICD-10-CM | POA: Diagnosis not present

## 2019-11-02 DIAGNOSIS — M2061 Acquired deformities of toe(s), unspecified, right foot: Secondary | ICD-10-CM | POA: Diagnosis not present

## 2019-11-02 DIAGNOSIS — M659 Synovitis and tenosynovitis, unspecified: Secondary | ICD-10-CM | POA: Diagnosis not present

## 2019-11-02 NOTE — Progress Notes (Signed)
  Subjective:  Patient ID: Diana Henderson, female    DOB: Feb 02, 1971,  MRN: [id number]  Chief Complaint  Patient presents with  . Foot Problem    i have some peeling on both feet and i have had surgery from Dr Malachy Mood in the past     49 y.o. female presents with the above complaint. History confirmed with patient.   Objective:  Physical Exam: warm, good capillary refill, no trophic changes or ulcerative lesions, normal DP and PT pulses and normal sensory exam. Left Foot: pain left foot over the incision with even light touch. Pain with deep palpation over the 1st TMT and lateral to this area at the possible area of retained screw. Pain at the PTT as it courses about the medial malleolus, weakness at the PTT. Pain at plantar heel.  Right Foot: no scaling plaques.   No images are attached to the encounter.  Radiographs: X-ray of the left foot: no fracture, dislocation, swelling or degenerative changes noted. Successful 1st TMT fusion, retained partial screw noted.  X-ray of the right foot: no fracture, dislocation, swelling or degenerative changes noted Assessment:   1. Acquired deformity of toe, unspecified laterality   2. Posterior tibial tendon dysfunction (PTTD) of left lower extremity   3. Tenosynovitis of foot   4. Plantar fasciitis, left      Plan:  Patient was evaluated and treated and all questions answered.  Retained hardware; retained ; CRPS is a possible issue given multiple foot surgeries bilateral feet. -Order MRI for review of LLE. Plan for possible surgical intervention for removal of the screw and other procedures as indicated  Return in about 3 weeks (around 11/23/2019) for MRI F/u.

## 2019-11-03 ENCOUNTER — Telehealth: Payer: Self-pay | Admitting: *Deleted

## 2019-11-03 DIAGNOSIS — M2060 Acquired deformities of toe(s), unspecified, unspecified foot: Secondary | ICD-10-CM

## 2019-11-03 DIAGNOSIS — M722 Plantar fascial fibromatosis: Secondary | ICD-10-CM

## 2019-11-03 DIAGNOSIS — M76822 Posterior tibial tendinitis, left leg: Secondary | ICD-10-CM

## 2019-11-03 DIAGNOSIS — M659 Synovitis and tenosynovitis, unspecified: Secondary | ICD-10-CM

## 2019-11-03 NOTE — Telephone Encounter (Signed)
-----   Message from Evelina Bucy, DPM sent at 11/02/2019  4:27 PM EDT ----- Can we order MRI left ankle for the diagnoses listed. Please ask to include the most distal extent of hte foot possible

## 2019-11-03 NOTE — Telephone Encounter (Signed)
[facility] - MEDICAID NOTIFICATION STATES, "THIS [place] MEDICAID MEMBER DOES NOT REQUIRE PRIOR AUTHORIZATION FOR OUTPATIENT RADIOLOGY THROUGH [facility] OR [place] [facility] AT THIS TIME." 

## 2019-11-05 NOTE — Telephone Encounter (Signed)
Spoke with Sherri from [facility] and states No authorization is required for MRI of Left ankle w/o contrast.

## 2019-11-05 NOTE — Telephone Encounter (Signed)
MRI appt was scheduled at [facility] for May/24th, pt checking in at 5:45 for 6 appt. Pt was notified of appt.  I faxed MRI orfer to [facility]

## 2019-11-10 DIAGNOSIS — M25572 Pain in left ankle and joints of left foot: Secondary | ICD-10-CM | POA: Diagnosis not present

## 2019-11-10 DIAGNOSIS — M76822 Posterior tibial tendinitis, left leg: Secondary | ICD-10-CM | POA: Diagnosis not present

## 2019-11-10 DIAGNOSIS — M7989 Other specified soft tissue disorders: Secondary | ICD-10-CM | POA: Diagnosis not present

## 2019-11-10 DIAGNOSIS — G4733 Obstructive sleep apnea (adult) (pediatric): Secondary | ICD-10-CM | POA: Diagnosis not present

## 2019-11-17 ENCOUNTER — Telehealth: Payer: Self-pay | Admitting: Podiatry

## 2019-11-17 DIAGNOSIS — G894 Chronic pain syndrome: Secondary | ICD-10-CM | POA: Diagnosis not present

## 2019-11-17 DIAGNOSIS — M79671 Pain in right foot: Secondary | ICD-10-CM | POA: Diagnosis not present

## 2019-11-17 DIAGNOSIS — M542 Cervicalgia: Secondary | ICD-10-CM | POA: Diagnosis not present

## 2019-11-17 DIAGNOSIS — G8929 Other chronic pain: Secondary | ICD-10-CM | POA: Diagnosis not present

## 2019-11-17 DIAGNOSIS — M25511 Pain in right shoulder: Secondary | ICD-10-CM | POA: Diagnosis not present

## 2019-11-17 DIAGNOSIS — M79672 Pain in left foot: Secondary | ICD-10-CM | POA: Diagnosis not present

## 2019-11-17 DIAGNOSIS — M25512 Pain in left shoulder: Secondary | ICD-10-CM | POA: Diagnosis not present

## 2019-11-17 DIAGNOSIS — Z79891 Long term (current) use of opiate analgesic: Secondary | ICD-10-CM | POA: Diagnosis not present

## 2019-11-18 DIAGNOSIS — I10 Essential (primary) hypertension: Secondary | ICD-10-CM | POA: Diagnosis not present

## 2019-11-18 DIAGNOSIS — J45909 Unspecified asthma, uncomplicated: Secondary | ICD-10-CM | POA: Diagnosis not present

## 2019-11-18 DIAGNOSIS — E119 Type 2 diabetes mellitus without complications: Secondary | ICD-10-CM | POA: Diagnosis not present

## 2019-11-18 DIAGNOSIS — G4733 Obstructive sleep apnea (adult) (pediatric): Secondary | ICD-10-CM | POA: Diagnosis not present

## 2019-11-25 ENCOUNTER — Encounter: Payer: Self-pay | Admitting: Sports Medicine

## 2019-11-30 ENCOUNTER — Ambulatory Visit: Payer: Medicare Other | Admitting: Podiatry

## 2019-12-01 ENCOUNTER — Ambulatory Visit (INDEPENDENT_AMBULATORY_CARE_PROVIDER_SITE_OTHER): Payer: Medicare Other | Admitting: Podiatry

## 2019-12-01 ENCOUNTER — Other Ambulatory Visit: Payer: Self-pay

## 2019-12-01 DIAGNOSIS — M76822 Posterior tibial tendinitis, left leg: Secondary | ICD-10-CM

## 2019-12-01 DIAGNOSIS — M2060 Acquired deformities of toe(s), unspecified, unspecified foot: Secondary | ICD-10-CM | POA: Diagnosis not present

## 2019-12-01 DIAGNOSIS — M19072 Primary osteoarthritis, left ankle and foot: Secondary | ICD-10-CM

## 2019-12-01 DIAGNOSIS — M659 Synovitis and tenosynovitis, unspecified: Secondary | ICD-10-CM

## 2019-12-01 DIAGNOSIS — M722 Plantar fascial fibromatosis: Secondary | ICD-10-CM

## 2019-12-01 DIAGNOSIS — M19079 Primary osteoarthritis, unspecified ankle and foot: Secondary | ICD-10-CM

## 2019-12-01 NOTE — Progress Notes (Signed)
  Subjective:  Patient ID: Diana Henderson, female    DOB: Nov 30, 1970,  MRN: [id number]  Chief Complaint  Patient presents with  . Results    Reveiw MRI results -pt states pain is worse on both feet    49 y.o. female presents with the above complaint. History confirmed with patient.   Objective:  Physical Exam: warm, good capillary refill, no trophic changes or ulcerative lesions, normal DP and PT pulses and normal sensory exam. Left Foot: pain left foot over the incision with even light touch. Pain with deep palpation over the 1st TMT and lateral to this area at the possible area of retained screw. Pain at the PTT as it courses about the medial malleolus, weakness at the PTT. Pain at plantar heel.  Right Foot: no scaling plaques.   Assessment:   1. Arthritis, midfoot   2. Acquired deformity of toe, unspecified laterality   3. Posterior tibial tendon dysfunction (PTTD) of left lower extremity   4. Tenosynovitis of foot   5. Plantar fasciitis, left      Plan:  Patient was evaluated and treated and all questions answered.  Retained hardware; retained ; CRPS is a possible issue given multiple foot surgeries bilateral feet. -MRI reviewed. -Discussed no acute findings warranting surgery at this time. Would avoid screw removal. Advised this would be very difficult and is unlikely to be causing pan -Injection delivered for arthritis.   Procedure: Joint Injection Location: Left 2nd TMT joint Skin Prep: Alcohol. Injectate: 0.5 cc 1% lidocaine plain, 0.5 cc dexamethasone phosphate. Disposition: Patient tolerated procedure well. Injection site dressed with a band-aid.    Return in about 1 month (around 12/31/2019).

## 2019-12-07 DIAGNOSIS — S9031XA Contusion of right foot, initial encounter: Secondary | ICD-10-CM | POA: Diagnosis not present

## 2019-12-07 DIAGNOSIS — S9032XA Contusion of left foot, initial encounter: Secondary | ICD-10-CM | POA: Diagnosis not present

## 2019-12-08 DIAGNOSIS — S99921A Unspecified injury of right foot, initial encounter: Secondary | ICD-10-CM | POA: Diagnosis not present

## 2019-12-08 DIAGNOSIS — M79672 Pain in left foot: Secondary | ICD-10-CM | POA: Diagnosis not present

## 2019-12-08 DIAGNOSIS — M79671 Pain in right foot: Secondary | ICD-10-CM | POA: Diagnosis not present

## 2019-12-08 DIAGNOSIS — S99922A Unspecified injury of left foot, initial encounter: Secondary | ICD-10-CM | POA: Diagnosis not present

## 2019-12-15 DIAGNOSIS — E119 Type 2 diabetes mellitus without complications: Secondary | ICD-10-CM | POA: Diagnosis not present

## 2019-12-15 DIAGNOSIS — G990 Autonomic neuropathy in diseases classified elsewhere: Secondary | ICD-10-CM | POA: Diagnosis not present

## 2019-12-15 DIAGNOSIS — Z79891 Long term (current) use of opiate analgesic: Secondary | ICD-10-CM | POA: Diagnosis not present

## 2019-12-15 DIAGNOSIS — E1142 Type 2 diabetes mellitus with diabetic polyneuropathy: Secondary | ICD-10-CM | POA: Diagnosis not present

## 2019-12-15 DIAGNOSIS — M25561 Pain in right knee: Secondary | ICD-10-CM | POA: Diagnosis not present

## 2019-12-15 DIAGNOSIS — M25562 Pain in left knee: Secondary | ICD-10-CM | POA: Diagnosis not present

## 2019-12-15 DIAGNOSIS — G8929 Other chronic pain: Secondary | ICD-10-CM | POA: Diagnosis not present

## 2019-12-15 DIAGNOSIS — G894 Chronic pain syndrome: Secondary | ICD-10-CM | POA: Diagnosis not present

## 2020-01-04 ENCOUNTER — Ambulatory Visit (INDEPENDENT_AMBULATORY_CARE_PROVIDER_SITE_OTHER): Payer: Medicare Other | Admitting: Podiatry

## 2020-01-04 ENCOUNTER — Other Ambulatory Visit: Payer: Self-pay

## 2020-01-04 DIAGNOSIS — E1169 Type 2 diabetes mellitus with other specified complication: Secondary | ICD-10-CM | POA: Diagnosis not present

## 2020-01-04 DIAGNOSIS — E1142 Type 2 diabetes mellitus with diabetic polyneuropathy: Secondary | ICD-10-CM | POA: Diagnosis not present

## 2020-01-04 DIAGNOSIS — M659 Synovitis and tenosynovitis, unspecified: Secondary | ICD-10-CM | POA: Diagnosis not present

## 2020-01-04 DIAGNOSIS — M19079 Primary osteoarthritis, unspecified ankle and foot: Secondary | ICD-10-CM

## 2020-01-04 DIAGNOSIS — B351 Tinea unguium: Secondary | ICD-10-CM | POA: Diagnosis not present

## 2020-01-04 NOTE — Progress Notes (Signed)
  Subjective:  Patient ID: Diana Henderson, female    DOB: 21-Sep-1970,  MRN: [id number]  Chief Complaint  Patient presents with  . Arthritis    F/U BL arthritis Pt. stats,' it's not getting better, both are the same; 9/10 shapr pains.' -w/ swelling -0wrose with wlaking tx: cane  . Diabetes    FBS: 120 a1C: 11 PCP: Iona Beard x May    49 y.o. female presents with the above complaint. History confirmed with patient. DM with occasional numbness to the feet.  Objective:  Physical Exam: warm, good capillary refill, no trophic changes or ulcerative lesions, normal DP and PT pulses and normal sensory exam. Left Foot: pain left foot over the incision with even light touch. Pain with deep palpation over the 1st TMT and lateral to this area at the possible area of retained screw. Pain at the PTT as it courses about the medial malleolus, weakness at the PTT. Pain at plantar heel.  Right Foot: no scaling plaques.   Assessment:   1. Onychomycosis of multiple toenails with type 2 diabetes mellitus and peripheral neuropathy ([place])   2. Arthritis, midfoot   3. Tenosynovitis of foot      Plan:  Patient was evaluated and treated and all questions answered.  Retained hardware; retained ; CRPS is a possible issue given multiple foot surgeries bilateral feet. -No repeat injection today -Reiterated hardware retrieval is unlikely to improve her pain and will be technically difficult and I do not recommend we proceed.  DM with DPN -Nails debrided x10   Procedure: Nail Debridement Rationale: Patient meets criteria for routine foot care due to DPN Type of Debridement: manual, sharp debridement. Instrumentation: Nail nipper, rotary burr. Number of Nails: 10  No follow-ups on file.

## 2020-01-07 DIAGNOSIS — G894 Chronic pain syndrome: Secondary | ICD-10-CM | POA: Diagnosis not present

## 2020-01-07 DIAGNOSIS — G8929 Other chronic pain: Secondary | ICD-10-CM | POA: Diagnosis not present

## 2020-01-07 DIAGNOSIS — E119 Type 2 diabetes mellitus without complications: Secondary | ICD-10-CM | POA: Diagnosis not present

## 2020-01-07 DIAGNOSIS — M25562 Pain in left knee: Secondary | ICD-10-CM | POA: Diagnosis not present

## 2020-01-07 DIAGNOSIS — E1142 Type 2 diabetes mellitus with diabetic polyneuropathy: Secondary | ICD-10-CM | POA: Diagnosis not present

## 2020-01-07 DIAGNOSIS — Z79891 Long term (current) use of opiate analgesic: Secondary | ICD-10-CM | POA: Diagnosis not present

## 2020-01-07 DIAGNOSIS — M25561 Pain in right knee: Secondary | ICD-10-CM | POA: Diagnosis not present

## 2020-01-07 DIAGNOSIS — G990 Autonomic neuropathy in diseases classified elsewhere: Secondary | ICD-10-CM | POA: Diagnosis not present

## 2020-01-13 DIAGNOSIS — G4733 Obstructive sleep apnea (adult) (pediatric): Secondary | ICD-10-CM | POA: Diagnosis not present

## 2020-01-25 DIAGNOSIS — M25561 Pain in right knee: Secondary | ICD-10-CM | POA: Diagnosis not present

## 2020-01-25 DIAGNOSIS — G8929 Other chronic pain: Secondary | ICD-10-CM | POA: Diagnosis not present

## 2020-01-25 DIAGNOSIS — M25562 Pain in left knee: Secondary | ICD-10-CM | POA: Diagnosis not present

## 2020-01-25 DIAGNOSIS — Z79891 Long term (current) use of opiate analgesic: Secondary | ICD-10-CM | POA: Diagnosis not present

## 2020-01-25 DIAGNOSIS — E119 Type 2 diabetes mellitus without complications: Secondary | ICD-10-CM | POA: Diagnosis not present

## 2020-01-25 DIAGNOSIS — E1142 Type 2 diabetes mellitus with diabetic polyneuropathy: Secondary | ICD-10-CM | POA: Diagnosis not present

## 2020-01-25 DIAGNOSIS — G894 Chronic pain syndrome: Secondary | ICD-10-CM | POA: Diagnosis not present

## 2020-01-25 DIAGNOSIS — G990 Autonomic neuropathy in diseases classified elsewhere: Secondary | ICD-10-CM | POA: Diagnosis not present

## 2020-01-28 DIAGNOSIS — Z20822 Contact with and (suspected) exposure to covid-19: Secondary | ICD-10-CM | POA: Diagnosis not present

## 2020-02-08 DIAGNOSIS — M25552 Pain in left hip: Secondary | ICD-10-CM | POA: Diagnosis not present

## 2020-02-08 DIAGNOSIS — Z20822 Contact with and (suspected) exposure to covid-19: Secondary | ICD-10-CM | POA: Diagnosis not present

## 2020-02-12 DIAGNOSIS — G4733 Obstructive sleep apnea (adult) (pediatric): Secondary | ICD-10-CM | POA: Diagnosis not present

## 2020-02-17 DIAGNOSIS — E119 Type 2 diabetes mellitus without complications: Secondary | ICD-10-CM | POA: Diagnosis not present

## 2020-02-18 DIAGNOSIS — E119 Type 2 diabetes mellitus without complications: Secondary | ICD-10-CM | POA: Diagnosis not present

## 2020-02-19 DIAGNOSIS — E119 Type 2 diabetes mellitus without complications: Secondary | ICD-10-CM | POA: Diagnosis not present

## 2020-02-20 DIAGNOSIS — E119 Type 2 diabetes mellitus without complications: Secondary | ICD-10-CM | POA: Diagnosis not present

## 2020-02-21 DIAGNOSIS — E119 Type 2 diabetes mellitus without complications: Secondary | ICD-10-CM | POA: Diagnosis not present

## 2020-02-22 DIAGNOSIS — E119 Type 2 diabetes mellitus without complications: Secondary | ICD-10-CM | POA: Diagnosis not present

## 2020-02-23 DIAGNOSIS — M25511 Pain in right shoulder: Secondary | ICD-10-CM | POA: Diagnosis not present

## 2020-02-23 DIAGNOSIS — Z79891 Long term (current) use of opiate analgesic: Secondary | ICD-10-CM | POA: Diagnosis not present

## 2020-02-23 DIAGNOSIS — M79642 Pain in left hand: Secondary | ICD-10-CM | POA: Diagnosis not present

## 2020-02-23 DIAGNOSIS — M25512 Pain in left shoulder: Secondary | ICD-10-CM | POA: Diagnosis not present

## 2020-02-23 DIAGNOSIS — M79672 Pain in left foot: Secondary | ICD-10-CM | POA: Diagnosis not present

## 2020-02-23 DIAGNOSIS — M79641 Pain in right hand: Secondary | ICD-10-CM | POA: Diagnosis not present

## 2020-02-23 DIAGNOSIS — M5417 Radiculopathy, lumbosacral region: Secondary | ICD-10-CM | POA: Diagnosis not present

## 2020-02-23 DIAGNOSIS — E119 Type 2 diabetes mellitus without complications: Secondary | ICD-10-CM | POA: Diagnosis not present

## 2020-02-23 DIAGNOSIS — M542 Cervicalgia: Secondary | ICD-10-CM | POA: Diagnosis not present

## 2020-02-23 DIAGNOSIS — G894 Chronic pain syndrome: Secondary | ICD-10-CM | POA: Diagnosis not present

## 2020-02-23 DIAGNOSIS — F1120 Opioid dependence, uncomplicated: Secondary | ICD-10-CM | POA: Diagnosis not present

## 2020-02-23 DIAGNOSIS — M79671 Pain in right foot: Secondary | ICD-10-CM | POA: Diagnosis not present

## 2020-02-23 DIAGNOSIS — G8929 Other chronic pain: Secondary | ICD-10-CM | POA: Diagnosis not present

## 2020-02-23 DIAGNOSIS — M25551 Pain in right hip: Secondary | ICD-10-CM | POA: Diagnosis not present

## 2020-02-23 DIAGNOSIS — M545 Low back pain: Secondary | ICD-10-CM | POA: Diagnosis not present

## 2020-02-24 DIAGNOSIS — E119 Type 2 diabetes mellitus without complications: Secondary | ICD-10-CM | POA: Diagnosis not present

## 2020-02-25 DIAGNOSIS — E119 Type 2 diabetes mellitus without complications: Secondary | ICD-10-CM | POA: Diagnosis not present

## 2020-02-26 DIAGNOSIS — E119 Type 2 diabetes mellitus without complications: Secondary | ICD-10-CM | POA: Diagnosis not present

## 2020-02-27 DIAGNOSIS — E119 Type 2 diabetes mellitus without complications: Secondary | ICD-10-CM | POA: Diagnosis not present

## 2020-02-28 DIAGNOSIS — E119 Type 2 diabetes mellitus without complications: Secondary | ICD-10-CM | POA: Diagnosis not present

## 2020-02-29 DIAGNOSIS — E119 Type 2 diabetes mellitus without complications: Secondary | ICD-10-CM | POA: Diagnosis not present

## 2020-03-01 ENCOUNTER — Ambulatory Visit: Payer: Medicare HMO | Admitting: Podiatry

## 2020-03-01 ENCOUNTER — Other Ambulatory Visit: Payer: Medicare HMO

## 2020-03-01 ENCOUNTER — Other Ambulatory Visit: Payer: Self-pay

## 2020-03-01 DIAGNOSIS — E1165 Type 2 diabetes mellitus with hyperglycemia: Secondary | ICD-10-CM | POA: Diagnosis not present

## 2020-03-01 DIAGNOSIS — M79672 Pain in left foot: Secondary | ICD-10-CM | POA: Diagnosis not present

## 2020-03-01 DIAGNOSIS — R52 Pain, unspecified: Secondary | ICD-10-CM | POA: Diagnosis not present

## 2020-03-01 DIAGNOSIS — W19XXXA Unspecified fall, initial encounter: Secondary | ICD-10-CM | POA: Diagnosis not present

## 2020-03-01 DIAGNOSIS — E119 Type 2 diabetes mellitus without complications: Secondary | ICD-10-CM | POA: Diagnosis not present

## 2020-03-01 DIAGNOSIS — I10 Essential (primary) hypertension: Secondary | ICD-10-CM | POA: Diagnosis not present

## 2020-03-01 DIAGNOSIS — G8929 Other chronic pain: Secondary | ICD-10-CM | POA: Diagnosis not present

## 2020-03-01 DIAGNOSIS — Z743 Need for continuous supervision: Secondary | ICD-10-CM | POA: Diagnosis not present

## 2020-03-02 DIAGNOSIS — E119 Type 2 diabetes mellitus without complications: Secondary | ICD-10-CM | POA: Diagnosis not present

## 2020-03-03 ENCOUNTER — Encounter: Payer: Self-pay | Admitting: Podiatry

## 2020-03-03 ENCOUNTER — Other Ambulatory Visit: Payer: Self-pay

## 2020-03-03 ENCOUNTER — Telehealth: Payer: Self-pay | Admitting: Podiatry

## 2020-03-03 ENCOUNTER — Ambulatory Visit (INDEPENDENT_AMBULATORY_CARE_PROVIDER_SITE_OTHER): Payer: Medicare HMO | Admitting: Podiatry

## 2020-03-03 DIAGNOSIS — E119 Type 2 diabetes mellitus without complications: Secondary | ICD-10-CM | POA: Diagnosis not present

## 2020-03-03 DIAGNOSIS — M19079 Primary osteoarthritis, unspecified ankle and foot: Secondary | ICD-10-CM

## 2020-03-03 DIAGNOSIS — M76822 Posterior tibial tendinitis, left leg: Secondary | ICD-10-CM | POA: Diagnosis not present

## 2020-03-03 DIAGNOSIS — Z1321 Encounter for screening for nutritional disorder: Secondary | ICD-10-CM | POA: Diagnosis not present

## 2020-03-03 DIAGNOSIS — Z794 Long term (current) use of insulin: Secondary | ICD-10-CM | POA: Diagnosis not present

## 2020-03-03 DIAGNOSIS — Z0001 Encounter for general adult medical examination with abnormal findings: Secondary | ICD-10-CM | POA: Diagnosis not present

## 2020-03-03 DIAGNOSIS — E1165 Type 2 diabetes mellitus with hyperglycemia: Secondary | ICD-10-CM | POA: Diagnosis not present

## 2020-03-03 DIAGNOSIS — I119 Hypertensive heart disease without heart failure: Secondary | ICD-10-CM | POA: Diagnosis not present

## 2020-03-03 DIAGNOSIS — R69 Illness, unspecified: Secondary | ICD-10-CM | POA: Diagnosis not present

## 2020-03-03 DIAGNOSIS — Z1329 Encounter for screening for other suspected endocrine disorder: Secondary | ICD-10-CM | POA: Diagnosis not present

## 2020-03-03 DIAGNOSIS — I10 Essential (primary) hypertension: Secondary | ICD-10-CM | POA: Diagnosis not present

## 2020-03-03 DIAGNOSIS — J4540 Moderate persistent asthma, uncomplicated: Secondary | ICD-10-CM | POA: Diagnosis not present

## 2020-03-03 NOTE — Telephone Encounter (Signed)
Pt left message that was hard to understand but said something about referral to Dr Iona Beard at the [facility].  I checked and pt was talking about diabetic shoe paperwork.  I returned call and left message that it takes a few days to get the shoes ordered and get the paperwork faxed but it should be there next week. Pt seen Betha 9.14.2021 in [place] office

## 2020-03-04 DIAGNOSIS — E119 Type 2 diabetes mellitus without complications: Secondary | ICD-10-CM | POA: Diagnosis not present

## 2020-03-05 DIAGNOSIS — E119 Type 2 diabetes mellitus without complications: Secondary | ICD-10-CM | POA: Diagnosis not present

## 2020-03-06 DIAGNOSIS — E119 Type 2 diabetes mellitus without complications: Secondary | ICD-10-CM | POA: Diagnosis not present

## 2020-03-07 DIAGNOSIS — E119 Type 2 diabetes mellitus without complications: Secondary | ICD-10-CM | POA: Diagnosis not present

## 2020-03-08 DIAGNOSIS — E119 Type 2 diabetes mellitus without complications: Secondary | ICD-10-CM | POA: Diagnosis not present

## 2020-03-09 DIAGNOSIS — E119 Type 2 diabetes mellitus without complications: Secondary | ICD-10-CM | POA: Diagnosis not present

## 2020-03-10 ENCOUNTER — Telehealth: Payer: Self-pay | Admitting: Podiatry

## 2020-03-10 ENCOUNTER — Telehealth: Payer: Self-pay | Admitting: *Deleted

## 2020-03-10 DIAGNOSIS — E119 Type 2 diabetes mellitus without complications: Secondary | ICD-10-CM | POA: Diagnosis not present

## 2020-03-10 NOTE — Telephone Encounter (Signed)
Called and spoke Diana Henderson from [place] on March 16, 2020 and the procedure codes [phone] and [phone] have to be prior authorized and the representative stated that we have to send the chart note to [employer] and it can take up to 15 business days for an approval and I sent the required chart note to [phone]. Lattie Haw

## 2020-03-10 NOTE — Telephone Encounter (Signed)
Still waiting insurance approval but she should hear in a few days

## 2020-03-10 NOTE — Telephone Encounter (Signed)
Pt called to inquire when her MRI will be. She has not heard anything.

## 2020-03-10 NOTE — Progress Notes (Signed)
  Subjective:  Patient ID: Diana Henderson, female    DOB: [birth year],  MRN: [id number]  Chief Complaint  Patient presents with  . Foot Problem    i went to the er and i think the screw broke on the left foot    49 y.o. female presents with the above complaint. History confirmed with patient. DM with occasional numbness to the feet.  Objective:  Physical Exam: warm, good capillary refill, no trophic changes or ulcerative lesions, normal DP and PT pulses and normal sensory exam. Left Foot: pain left foot over the incision with even light touch. Pain with deep palpation over the 1st TMT and lateral to this area at the possible area of retained screw. Pain at the PTT as it courses about the medial malleolus, weakness at the PTT. Pain at plantar heel.  Right Foot: no scaling plaques.   Assessment:   1. Arthritis, midfoot   2. Posterior tibial tendon dysfunction (PTTD) of left lower extremity      Plan:  Patient was evaluated and treated and all questions answered.  Retained hardware; retained ; CRPS is a possible issue given multiple foot surgeries bilateral feet. -Reiterated that I do not think screw removal is warranted. -She does hav a lot of pain with some edema to the foot. I think there is an inflammatory process going on but I do not believe the residual screw is source of pain -Will order MRI to eval arthritic changes.  No follow-ups on file.

## 2020-03-10 NOTE — Telephone Encounter (Signed)
The case number is [id number]. Diana Henderson

## 2020-03-11 DIAGNOSIS — E119 Type 2 diabetes mellitus without complications: Secondary | ICD-10-CM | POA: Diagnosis not present

## 2020-03-12 DIAGNOSIS — E119 Type 2 diabetes mellitus without complications: Secondary | ICD-10-CM | POA: Diagnosis not present

## 2020-03-13 DIAGNOSIS — E119 Type 2 diabetes mellitus without complications: Secondary | ICD-10-CM | POA: Diagnosis not present

## 2020-03-14 DIAGNOSIS — E119 Type 2 diabetes mellitus without complications: Secondary | ICD-10-CM | POA: Diagnosis not present

## 2020-03-15 ENCOUNTER — Telehealth: Payer: Self-pay

## 2020-03-15 DIAGNOSIS — M5126 Other intervertebral disc displacement, lumbar region: Secondary | ICD-10-CM | POA: Diagnosis not present

## 2020-03-15 DIAGNOSIS — M79605 Pain in left leg: Secondary | ICD-10-CM | POA: Diagnosis not present

## 2020-03-15 DIAGNOSIS — M4316 Spondylolisthesis, lumbar region: Secondary | ICD-10-CM | POA: Diagnosis not present

## 2020-03-15 DIAGNOSIS — M5440 Lumbago with sciatica, unspecified side: Secondary | ICD-10-CM | POA: Diagnosis not present

## 2020-03-15 DIAGNOSIS — M7989 Other specified soft tissue disorders: Secondary | ICD-10-CM | POA: Diagnosis not present

## 2020-03-15 DIAGNOSIS — E119 Type 2 diabetes mellitus without complications: Secondary | ICD-10-CM | POA: Diagnosis not present

## 2020-03-15 DIAGNOSIS — M48061 Spinal stenosis, lumbar region without neurogenic claudication: Secondary | ICD-10-CM | POA: Diagnosis not present

## 2020-03-15 DIAGNOSIS — M47816 Spondylosis without myelopathy or radiculopathy, lumbar region: Secondary | ICD-10-CM | POA: Diagnosis not present

## 2020-03-15 DIAGNOSIS — M5136 Other intervertebral disc degeneration, lumbar region: Secondary | ICD-10-CM | POA: Diagnosis not present

## 2020-03-15 DIAGNOSIS — M79606 Pain in leg, unspecified: Secondary | ICD-10-CM | POA: Diagnosis not present

## 2020-03-15 DIAGNOSIS — M5442 Lumbago with sciatica, left side: Secondary | ICD-10-CM | POA: Diagnosis not present

## 2020-03-15 NOTE — Telephone Encounter (Signed)
Pt would like to know about the status of her MRI. Please update

## 2020-03-16 DIAGNOSIS — I10 Essential (primary) hypertension: Secondary | ICD-10-CM | POA: Diagnosis not present

## 2020-03-16 DIAGNOSIS — G4733 Obstructive sleep apnea (adult) (pediatric): Secondary | ICD-10-CM | POA: Diagnosis not present

## 2020-03-16 DIAGNOSIS — E119 Type 2 diabetes mellitus without complications: Secondary | ICD-10-CM | POA: Diagnosis not present

## 2020-03-16 DIAGNOSIS — Z6841 Body Mass Index (BMI) 40.0 and over, adult: Secondary | ICD-10-CM | POA: Diagnosis not present

## 2020-03-16 DIAGNOSIS — E1142 Type 2 diabetes mellitus with diabetic polyneuropathy: Secondary | ICD-10-CM | POA: Diagnosis not present

## 2020-03-16 NOTE — Telephone Encounter (Signed)
Called and talked to patient and stated that it could take up to 15 business days to get an approval. Diana Henderson

## 2020-03-17 DIAGNOSIS — E119 Type 2 diabetes mellitus without complications: Secondary | ICD-10-CM | POA: Diagnosis not present

## 2020-03-18 DIAGNOSIS — E119 Type 2 diabetes mellitus without complications: Secondary | ICD-10-CM | POA: Diagnosis not present

## 2020-03-19 DIAGNOSIS — E119 Type 2 diabetes mellitus without complications: Secondary | ICD-10-CM | POA: Diagnosis not present

## 2020-03-20 DIAGNOSIS — E119 Type 2 diabetes mellitus without complications: Secondary | ICD-10-CM | POA: Diagnosis not present

## 2020-03-21 DIAGNOSIS — E119 Type 2 diabetes mellitus without complications: Secondary | ICD-10-CM | POA: Diagnosis not present

## 2020-03-21 DIAGNOSIS — R69 Illness, unspecified: Secondary | ICD-10-CM | POA: Diagnosis not present

## 2020-03-22 DIAGNOSIS — M546 Pain in thoracic spine: Secondary | ICD-10-CM | POA: Diagnosis not present

## 2020-03-22 DIAGNOSIS — M542 Cervicalgia: Secondary | ICD-10-CM | POA: Diagnosis not present

## 2020-03-22 DIAGNOSIS — M25522 Pain in left elbow: Secondary | ICD-10-CM | POA: Diagnosis not present

## 2020-03-22 DIAGNOSIS — E1149 Type 2 diabetes mellitus with other diabetic neurological complication: Secondary | ICD-10-CM | POA: Diagnosis not present

## 2020-03-22 DIAGNOSIS — G8929 Other chronic pain: Secondary | ICD-10-CM | POA: Diagnosis not present

## 2020-03-22 DIAGNOSIS — M79622 Pain in left upper arm: Secondary | ICD-10-CM | POA: Diagnosis not present

## 2020-03-22 DIAGNOSIS — M25512 Pain in left shoulder: Secondary | ICD-10-CM | POA: Diagnosis not present

## 2020-03-22 DIAGNOSIS — F1120 Opioid dependence, uncomplicated: Secondary | ICD-10-CM | POA: Diagnosis not present

## 2020-03-22 DIAGNOSIS — E119 Type 2 diabetes mellitus without complications: Secondary | ICD-10-CM | POA: Diagnosis not present

## 2020-03-22 DIAGNOSIS — Z79891 Long term (current) use of opiate analgesic: Secondary | ICD-10-CM | POA: Diagnosis not present

## 2020-03-22 DIAGNOSIS — G894 Chronic pain syndrome: Secondary | ICD-10-CM | POA: Diagnosis not present

## 2020-03-22 DIAGNOSIS — M25521 Pain in right elbow: Secondary | ICD-10-CM | POA: Diagnosis not present

## 2020-03-22 DIAGNOSIS — R69 Illness, unspecified: Secondary | ICD-10-CM | POA: Diagnosis not present

## 2020-03-23 DIAGNOSIS — E119 Type 2 diabetes mellitus without complications: Secondary | ICD-10-CM | POA: Diagnosis not present

## 2020-03-24 DIAGNOSIS — E119 Type 2 diabetes mellitus without complications: Secondary | ICD-10-CM | POA: Diagnosis not present

## 2020-03-25 DIAGNOSIS — E119 Type 2 diabetes mellitus without complications: Secondary | ICD-10-CM | POA: Diagnosis not present

## 2020-03-26 DIAGNOSIS — E119 Type 2 diabetes mellitus without complications: Secondary | ICD-10-CM | POA: Diagnosis not present

## 2020-03-27 DIAGNOSIS — E119 Type 2 diabetes mellitus without complications: Secondary | ICD-10-CM | POA: Diagnosis not present

## 2020-03-28 ENCOUNTER — Telehealth: Payer: Self-pay | Admitting: *Deleted

## 2020-03-28 DIAGNOSIS — E119 Type 2 diabetes mellitus without complications: Secondary | ICD-10-CM | POA: Diagnosis not present

## 2020-03-28 NOTE — Telephone Encounter (Signed)
Called and stated that the MRI's thru the insurance was denied and that we are having to do an appeal and it can take up to 30 days to hear back and due not enough clinical records to review and called and left patient a message. Diana Henderson

## 2020-03-29 DIAGNOSIS — E119 Type 2 diabetes mellitus without complications: Secondary | ICD-10-CM | POA: Diagnosis not present

## 2020-03-29 DIAGNOSIS — M545 Low back pain, unspecified: Secondary | ICD-10-CM | POA: Diagnosis not present

## 2020-03-30 DIAGNOSIS — E119 Type 2 diabetes mellitus without complications: Secondary | ICD-10-CM | POA: Diagnosis not present

## 2020-03-31 DIAGNOSIS — E119 Type 2 diabetes mellitus without complications: Secondary | ICD-10-CM | POA: Diagnosis not present

## 2020-04-01 DIAGNOSIS — E119 Type 2 diabetes mellitus without complications: Secondary | ICD-10-CM | POA: Diagnosis not present

## 2020-04-02 DIAGNOSIS — E119 Type 2 diabetes mellitus without complications: Secondary | ICD-10-CM | POA: Diagnosis not present

## 2020-04-03 DIAGNOSIS — E119 Type 2 diabetes mellitus without complications: Secondary | ICD-10-CM | POA: Diagnosis not present

## 2020-04-04 DIAGNOSIS — E119 Type 2 diabetes mellitus without complications: Secondary | ICD-10-CM | POA: Diagnosis not present

## 2020-04-05 DIAGNOSIS — E119 Type 2 diabetes mellitus without complications: Secondary | ICD-10-CM | POA: Diagnosis not present

## 2020-04-06 DIAGNOSIS — E1142 Type 2 diabetes mellitus with diabetic polyneuropathy: Secondary | ICD-10-CM | POA: Diagnosis not present

## 2020-04-06 DIAGNOSIS — Z8249 Family history of ischemic heart disease and other diseases of the circulatory system: Secondary | ICD-10-CM | POA: Diagnosis not present

## 2020-04-06 DIAGNOSIS — Z79899 Other long term (current) drug therapy: Secondary | ICD-10-CM | POA: Diagnosis not present

## 2020-04-06 DIAGNOSIS — J449 Chronic obstructive pulmonary disease, unspecified: Secondary | ICD-10-CM | POA: Diagnosis not present

## 2020-04-06 DIAGNOSIS — Z794 Long term (current) use of insulin: Secondary | ICD-10-CM | POA: Diagnosis not present

## 2020-04-06 DIAGNOSIS — R030 Elevated blood-pressure reading, without diagnosis of hypertension: Secondary | ICD-10-CM | POA: Diagnosis not present

## 2020-04-06 DIAGNOSIS — I119 Hypertensive heart disease without heart failure: Secondary | ICD-10-CM | POA: Diagnosis not present

## 2020-04-06 DIAGNOSIS — Z6841 Body Mass Index (BMI) 40.0 and over, adult: Secondary | ICD-10-CM | POA: Diagnosis not present

## 2020-04-06 DIAGNOSIS — Z791 Long term (current) use of non-steroidal anti-inflammatories (NSAID): Secondary | ICD-10-CM | POA: Diagnosis not present

## 2020-04-06 DIAGNOSIS — E1165 Type 2 diabetes mellitus with hyperglycemia: Secondary | ICD-10-CM | POA: Diagnosis not present

## 2020-04-06 DIAGNOSIS — J4540 Moderate persistent asthma, uncomplicated: Secondary | ICD-10-CM | POA: Diagnosis not present

## 2020-04-06 DIAGNOSIS — G8929 Other chronic pain: Secondary | ICD-10-CM | POA: Diagnosis not present

## 2020-04-06 DIAGNOSIS — M1990 Unspecified osteoarthritis, unspecified site: Secondary | ICD-10-CM | POA: Diagnosis not present

## 2020-04-06 DIAGNOSIS — E119 Type 2 diabetes mellitus without complications: Secondary | ICD-10-CM | POA: Diagnosis not present

## 2020-04-06 DIAGNOSIS — I10 Essential (primary) hypertension: Secondary | ICD-10-CM | POA: Diagnosis not present

## 2020-04-07 DIAGNOSIS — E119 Type 2 diabetes mellitus without complications: Secondary | ICD-10-CM | POA: Diagnosis not present

## 2020-04-08 DIAGNOSIS — E119 Type 2 diabetes mellitus without complications: Secondary | ICD-10-CM | POA: Diagnosis not present

## 2020-04-09 DIAGNOSIS — E119 Type 2 diabetes mellitus without complications: Secondary | ICD-10-CM | POA: Diagnosis not present

## 2020-04-10 DIAGNOSIS — E119 Type 2 diabetes mellitus without complications: Secondary | ICD-10-CM | POA: Diagnosis not present

## 2020-04-11 ENCOUNTER — Telehealth: Payer: Self-pay | Admitting: *Deleted

## 2020-04-11 DIAGNOSIS — Z1211 Encounter for screening for malignant neoplasm of colon: Secondary | ICD-10-CM | POA: Diagnosis not present

## 2020-04-11 DIAGNOSIS — I10 Essential (primary) hypertension: Secondary | ICD-10-CM | POA: Diagnosis not present

## 2020-04-11 DIAGNOSIS — E1165 Type 2 diabetes mellitus with hyperglycemia: Secondary | ICD-10-CM | POA: Diagnosis not present

## 2020-04-11 DIAGNOSIS — E119 Type 2 diabetes mellitus without complications: Secondary | ICD-10-CM | POA: Diagnosis not present

## 2020-04-11 NOTE — Telephone Encounter (Signed)
Called [employer] [phone])  and spoke with Illene Silver and stated that I need to see if the procedure codes [phone] (foot) and [phone] (ankle) needs a prior authorization and per the representative the authorization was approved and the number is [id number] and is valid 03/31/2020 through 09/27/2020 and I spoke with Kathlee Nations at [facility] and the date is April 14, 2020 and arrive at 2:00 pm for a 2:15 pm appointment. Lattie Haw and called the patient as well. Lattie Haw

## 2020-04-12 DIAGNOSIS — F439 Reaction to severe stress, unspecified: Secondary | ICD-10-CM | POA: Diagnosis not present

## 2020-04-12 DIAGNOSIS — R69 Illness, unspecified: Secondary | ICD-10-CM | POA: Diagnosis not present

## 2020-04-12 DIAGNOSIS — E119 Type 2 diabetes mellitus without complications: Secondary | ICD-10-CM | POA: Diagnosis not present

## 2020-04-13 DIAGNOSIS — E119 Type 2 diabetes mellitus without complications: Secondary | ICD-10-CM | POA: Diagnosis not present

## 2020-04-14 DIAGNOSIS — M76822 Posterior tibial tendinitis, left leg: Secondary | ICD-10-CM | POA: Diagnosis not present

## 2020-04-14 DIAGNOSIS — M19072 Primary osteoarthritis, left ankle and foot: Secondary | ICD-10-CM | POA: Diagnosis not present

## 2020-04-14 DIAGNOSIS — E119 Type 2 diabetes mellitus without complications: Secondary | ICD-10-CM | POA: Diagnosis not present

## 2020-04-14 DIAGNOSIS — Z9889 Other specified postprocedural states: Secondary | ICD-10-CM | POA: Diagnosis not present

## 2020-04-14 DIAGNOSIS — M19079 Primary osteoarthritis, unspecified ankle and foot: Secondary | ICD-10-CM | POA: Diagnosis not present

## 2020-04-14 DIAGNOSIS — M7989 Other specified soft tissue disorders: Secondary | ICD-10-CM | POA: Diagnosis not present

## 2020-04-14 DIAGNOSIS — Z981 Arthrodesis status: Secondary | ICD-10-CM | POA: Diagnosis not present

## 2020-04-14 DIAGNOSIS — R600 Localized edema: Secondary | ICD-10-CM | POA: Diagnosis not present

## 2020-04-15 DIAGNOSIS — E119 Type 2 diabetes mellitus without complications: Secondary | ICD-10-CM | POA: Diagnosis not present

## 2020-04-16 DIAGNOSIS — E119 Type 2 diabetes mellitus without complications: Secondary | ICD-10-CM | POA: Diagnosis not present

## 2020-04-17 DIAGNOSIS — E119 Type 2 diabetes mellitus without complications: Secondary | ICD-10-CM | POA: Diagnosis not present

## 2020-04-18 DIAGNOSIS — M79672 Pain in left foot: Secondary | ICD-10-CM | POA: Diagnosis not present

## 2020-04-18 DIAGNOSIS — M25562 Pain in left knee: Secondary | ICD-10-CM | POA: Diagnosis not present

## 2020-04-18 DIAGNOSIS — M79641 Pain in right hand: Secondary | ICD-10-CM | POA: Diagnosis not present

## 2020-04-18 DIAGNOSIS — M79671 Pain in right foot: Secondary | ICD-10-CM | POA: Diagnosis not present

## 2020-04-18 DIAGNOSIS — M79642 Pain in left hand: Secondary | ICD-10-CM | POA: Diagnosis not present

## 2020-04-18 DIAGNOSIS — G8929 Other chronic pain: Secondary | ICD-10-CM | POA: Diagnosis not present

## 2020-04-18 DIAGNOSIS — M25551 Pain in right hip: Secondary | ICD-10-CM | POA: Diagnosis not present

## 2020-04-18 DIAGNOSIS — M5417 Radiculopathy, lumbosacral region: Secondary | ICD-10-CM | POA: Diagnosis not present

## 2020-04-18 DIAGNOSIS — Z79891 Long term (current) use of opiate analgesic: Secondary | ICD-10-CM | POA: Diagnosis not present

## 2020-04-18 DIAGNOSIS — F1120 Opioid dependence, uncomplicated: Secondary | ICD-10-CM | POA: Diagnosis not present

## 2020-04-18 DIAGNOSIS — E119 Type 2 diabetes mellitus without complications: Secondary | ICD-10-CM | POA: Diagnosis not present

## 2020-04-18 DIAGNOSIS — G894 Chronic pain syndrome: Secondary | ICD-10-CM | POA: Diagnosis not present

## 2020-04-18 DIAGNOSIS — M25511 Pain in right shoulder: Secondary | ICD-10-CM | POA: Diagnosis not present

## 2020-04-18 DIAGNOSIS — M25561 Pain in right knee: Secondary | ICD-10-CM | POA: Diagnosis not present

## 2020-04-18 DIAGNOSIS — M25512 Pain in left shoulder: Secondary | ICD-10-CM | POA: Diagnosis not present

## 2020-04-19 DIAGNOSIS — E119 Type 2 diabetes mellitus without complications: Secondary | ICD-10-CM | POA: Diagnosis not present

## 2020-04-20 DIAGNOSIS — I119 Hypertensive heart disease without heart failure: Secondary | ICD-10-CM | POA: Diagnosis not present

## 2020-04-20 DIAGNOSIS — E1165 Type 2 diabetes mellitus with hyperglycemia: Secondary | ICD-10-CM | POA: Diagnosis not present

## 2020-04-20 DIAGNOSIS — I10 Essential (primary) hypertension: Secondary | ICD-10-CM | POA: Diagnosis not present

## 2020-04-20 DIAGNOSIS — J4540 Moderate persistent asthma, uncomplicated: Secondary | ICD-10-CM | POA: Diagnosis not present

## 2020-04-20 DIAGNOSIS — E119 Type 2 diabetes mellitus without complications: Secondary | ICD-10-CM | POA: Diagnosis not present

## 2020-04-20 DIAGNOSIS — Z794 Long term (current) use of insulin: Secondary | ICD-10-CM | POA: Diagnosis not present

## 2020-04-21 DIAGNOSIS — E119 Type 2 diabetes mellitus without complications: Secondary | ICD-10-CM | POA: Diagnosis not present

## 2020-04-22 ENCOUNTER — Encounter: Payer: Self-pay | Admitting: Podiatry

## 2020-04-22 ENCOUNTER — Telehealth: Payer: Self-pay | Admitting: Podiatry

## 2020-04-22 DIAGNOSIS — E119 Type 2 diabetes mellitus without complications: Secondary | ICD-10-CM | POA: Diagnosis not present

## 2020-04-22 NOTE — Telephone Encounter (Signed)
Pt would like MRI results. Would you like me to schedule her an appt, or would you like to call her? Please advise.

## 2020-04-23 DIAGNOSIS — E119 Type 2 diabetes mellitus without complications: Secondary | ICD-10-CM | POA: Diagnosis not present

## 2020-04-24 DIAGNOSIS — E119 Type 2 diabetes mellitus without complications: Secondary | ICD-10-CM | POA: Diagnosis not present

## 2020-04-25 ENCOUNTER — Telehealth: Payer: Self-pay | Admitting: Podiatry

## 2020-04-25 DIAGNOSIS — E119 Type 2 diabetes mellitus without complications: Secondary | ICD-10-CM | POA: Diagnosis not present

## 2020-04-25 NOTE — Telephone Encounter (Signed)
Pt called & would like to know status of shoes.  Didn't see note in chart.  thanks

## 2020-04-26 DIAGNOSIS — E119 Type 2 diabetes mellitus without complications: Secondary | ICD-10-CM | POA: Diagnosis not present

## 2020-04-26 NOTE — Telephone Encounter (Signed)
Will review results in the office

## 2020-04-27 DIAGNOSIS — E119 Type 2 diabetes mellitus without complications: Secondary | ICD-10-CM | POA: Diagnosis not present

## 2020-04-28 DIAGNOSIS — E119 Type 2 diabetes mellitus without complications: Secondary | ICD-10-CM | POA: Diagnosis not present

## 2020-04-29 DIAGNOSIS — E119 Type 2 diabetes mellitus without complications: Secondary | ICD-10-CM | POA: Diagnosis not present

## 2020-04-30 DIAGNOSIS — E119 Type 2 diabetes mellitus without complications: Secondary | ICD-10-CM | POA: Diagnosis not present

## 2020-05-01 DIAGNOSIS — E119 Type 2 diabetes mellitus without complications: Secondary | ICD-10-CM | POA: Diagnosis not present

## 2020-05-02 DIAGNOSIS — E119 Type 2 diabetes mellitus without complications: Secondary | ICD-10-CM | POA: Diagnosis not present

## 2020-05-03 DIAGNOSIS — E119 Type 2 diabetes mellitus without complications: Secondary | ICD-10-CM | POA: Diagnosis not present

## 2020-05-03 DIAGNOSIS — R69 Illness, unspecified: Secondary | ICD-10-CM | POA: Diagnosis not present

## 2020-05-03 DIAGNOSIS — F39 Unspecified mood [affective] disorder: Secondary | ICD-10-CM | POA: Diagnosis not present

## 2020-05-03 DIAGNOSIS — F439 Reaction to severe stress, unspecified: Secondary | ICD-10-CM | POA: Diagnosis not present

## 2020-05-04 DIAGNOSIS — E119 Type 2 diabetes mellitus without complications: Secondary | ICD-10-CM | POA: Diagnosis not present

## 2020-05-05 ENCOUNTER — Ambulatory Visit (INDEPENDENT_AMBULATORY_CARE_PROVIDER_SITE_OTHER): Payer: Medicare HMO | Admitting: Podiatry

## 2020-05-05 ENCOUNTER — Encounter: Payer: Self-pay | Admitting: Podiatry

## 2020-05-05 ENCOUNTER — Other Ambulatory Visit: Payer: Self-pay

## 2020-05-05 DIAGNOSIS — B351 Tinea unguium: Secondary | ICD-10-CM | POA: Diagnosis not present

## 2020-05-05 DIAGNOSIS — M76822 Posterior tibial tendinitis, left leg: Secondary | ICD-10-CM

## 2020-05-05 DIAGNOSIS — E1169 Type 2 diabetes mellitus with other specified complication: Secondary | ICD-10-CM | POA: Diagnosis not present

## 2020-05-05 DIAGNOSIS — M659 Synovitis and tenosynovitis, unspecified: Secondary | ICD-10-CM

## 2020-05-05 DIAGNOSIS — E119 Type 2 diabetes mellitus without complications: Secondary | ICD-10-CM | POA: Diagnosis not present

## 2020-05-05 DIAGNOSIS — E1142 Type 2 diabetes mellitus with diabetic polyneuropathy: Secondary | ICD-10-CM | POA: Diagnosis not present

## 2020-05-05 DIAGNOSIS — M19079 Primary osteoarthritis, unspecified ankle and foot: Secondary | ICD-10-CM

## 2020-05-06 DIAGNOSIS — E119 Type 2 diabetes mellitus without complications: Secondary | ICD-10-CM | POA: Diagnosis not present

## 2020-05-07 DIAGNOSIS — E119 Type 2 diabetes mellitus without complications: Secondary | ICD-10-CM | POA: Diagnosis not present

## 2020-05-08 DIAGNOSIS — E119 Type 2 diabetes mellitus without complications: Secondary | ICD-10-CM | POA: Diagnosis not present

## 2020-05-09 DIAGNOSIS — E119 Type 2 diabetes mellitus without complications: Secondary | ICD-10-CM | POA: Diagnosis not present

## 2020-05-10 DIAGNOSIS — M6281 Muscle weakness (generalized): Secondary | ICD-10-CM | POA: Diagnosis not present

## 2020-05-10 DIAGNOSIS — M79672 Pain in left foot: Secondary | ICD-10-CM | POA: Diagnosis not present

## 2020-05-10 DIAGNOSIS — M25675 Stiffness of left foot, not elsewhere classified: Secondary | ICD-10-CM | POA: Diagnosis not present

## 2020-05-10 DIAGNOSIS — E119 Type 2 diabetes mellitus without complications: Secondary | ICD-10-CM | POA: Diagnosis not present

## 2020-05-11 DIAGNOSIS — E119 Type 2 diabetes mellitus without complications: Secondary | ICD-10-CM | POA: Diagnosis not present

## 2020-05-13 DIAGNOSIS — M6281 Muscle weakness (generalized): Secondary | ICD-10-CM | POA: Diagnosis not present

## 2020-05-13 DIAGNOSIS — M25675 Stiffness of left foot, not elsewhere classified: Secondary | ICD-10-CM | POA: Diagnosis not present

## 2020-05-13 DIAGNOSIS — M79672 Pain in left foot: Secondary | ICD-10-CM | POA: Diagnosis not present

## 2020-05-13 DIAGNOSIS — E119 Type 2 diabetes mellitus without complications: Secondary | ICD-10-CM | POA: Diagnosis not present

## 2020-05-14 DIAGNOSIS — G4733 Obstructive sleep apnea (adult) (pediatric): Secondary | ICD-10-CM | POA: Diagnosis not present

## 2020-05-14 DIAGNOSIS — E119 Type 2 diabetes mellitus without complications: Secondary | ICD-10-CM | POA: Diagnosis not present

## 2020-05-15 DIAGNOSIS — E119 Type 2 diabetes mellitus without complications: Secondary | ICD-10-CM | POA: Diagnosis not present

## 2020-05-16 ENCOUNTER — Encounter: Payer: Self-pay | Admitting: Podiatry

## 2020-05-16 DIAGNOSIS — E119 Type 2 diabetes mellitus without complications: Secondary | ICD-10-CM | POA: Diagnosis not present

## 2020-05-17 DIAGNOSIS — M25561 Pain in right knee: Secondary | ICD-10-CM | POA: Diagnosis not present

## 2020-05-17 DIAGNOSIS — Z79891 Long term (current) use of opiate analgesic: Secondary | ICD-10-CM | POA: Diagnosis not present

## 2020-05-17 DIAGNOSIS — M25511 Pain in right shoulder: Secondary | ICD-10-CM | POA: Diagnosis not present

## 2020-05-17 DIAGNOSIS — M5417 Radiculopathy, lumbosacral region: Secondary | ICD-10-CM | POA: Diagnosis not present

## 2020-05-17 DIAGNOSIS — M25551 Pain in right hip: Secondary | ICD-10-CM | POA: Diagnosis not present

## 2020-05-17 DIAGNOSIS — M79641 Pain in right hand: Secondary | ICD-10-CM | POA: Diagnosis not present

## 2020-05-17 DIAGNOSIS — M25512 Pain in left shoulder: Secondary | ICD-10-CM | POA: Diagnosis not present

## 2020-05-17 DIAGNOSIS — M79642 Pain in left hand: Secondary | ICD-10-CM | POA: Diagnosis not present

## 2020-05-17 DIAGNOSIS — M542 Cervicalgia: Secondary | ICD-10-CM | POA: Diagnosis not present

## 2020-05-17 DIAGNOSIS — G8929 Other chronic pain: Secondary | ICD-10-CM | POA: Diagnosis not present

## 2020-05-17 DIAGNOSIS — E119 Type 2 diabetes mellitus without complications: Secondary | ICD-10-CM | POA: Diagnosis not present

## 2020-05-17 DIAGNOSIS — M79672 Pain in left foot: Secondary | ICD-10-CM | POA: Diagnosis not present

## 2020-05-17 DIAGNOSIS — F1120 Opioid dependence, uncomplicated: Secondary | ICD-10-CM | POA: Diagnosis not present

## 2020-05-17 DIAGNOSIS — G894 Chronic pain syndrome: Secondary | ICD-10-CM | POA: Diagnosis not present

## 2020-05-17 DIAGNOSIS — M79671 Pain in right foot: Secondary | ICD-10-CM | POA: Diagnosis not present

## 2020-05-18 DIAGNOSIS — E119 Type 2 diabetes mellitus without complications: Secondary | ICD-10-CM | POA: Diagnosis not present

## 2020-05-18 NOTE — Progress Notes (Signed)
  Subjective:  Patient ID: [name], female    DOB: Jan 15, 1971,  MRN: [id number]  Chief Complaint  Patient presents with  . Foot Problem    my foot still hurts and i am here for the MRI results    49 y.o. female presents with the above complaint. History confirmed with patient. DM with occasional numbness to the feet.  Objective:  Physical Exam: warm, good capillary refill, no trophic changes or ulcerative lesions, normal DP and PT pulses and reduced sensory exam.with LOPS Left Foot: pain left foot over the incision with even light touch. Pain with deep palpation over the 1st TMT and lateral to this area at the possible area of retained screw. Pain at the PTT as it courses about the medial malleolus, weakness at the PTT. Pain at plantar heel.  Right Foot: no scaling plaques.   Assessment:   1. Onychomycosis of multiple toenails with type 2 diabetes mellitus and peripheral neuropathy ([place])   2. Arthritis, midfoot   3. Posterior tibial tendon dysfunction (PTTD) of left lower extremity   4. Tenosynovitis of foot      Plan:  Patient was evaluated and treated and all questions answered.  Retained hardware; retained ; CRPS is a possible issue given multiple foot surgeries bilateral feet. -MRI reviewed, no significant interval change. I discussed I think she would still benefit from PT. Order placed.  Diabetes with DPN, Onychomycosis -Nails x10 debrided sharply and manually with large nail nipper and rotary burr.   No follow-ups on file.

## 2020-05-19 DIAGNOSIS — E119 Type 2 diabetes mellitus without complications: Secondary | ICD-10-CM | POA: Diagnosis not present

## 2020-05-20 DIAGNOSIS — E119 Type 2 diabetes mellitus without complications: Secondary | ICD-10-CM | POA: Diagnosis not present

## 2020-05-21 DIAGNOSIS — E119 Type 2 diabetes mellitus without complications: Secondary | ICD-10-CM | POA: Diagnosis not present

## 2020-05-22 DIAGNOSIS — E119 Type 2 diabetes mellitus without complications: Secondary | ICD-10-CM | POA: Diagnosis not present

## 2020-05-23 DIAGNOSIS — E119 Type 2 diabetes mellitus without complications: Secondary | ICD-10-CM | POA: Diagnosis not present

## 2020-05-24 DIAGNOSIS — E119 Type 2 diabetes mellitus without complications: Secondary | ICD-10-CM | POA: Diagnosis not present

## 2020-05-25 DIAGNOSIS — E119 Type 2 diabetes mellitus without complications: Secondary | ICD-10-CM | POA: Diagnosis not present

## 2020-05-26 DIAGNOSIS — E119 Type 2 diabetes mellitus without complications: Secondary | ICD-10-CM | POA: Diagnosis not present

## 2020-05-27 DIAGNOSIS — E119 Type 2 diabetes mellitus without complications: Secondary | ICD-10-CM | POA: Diagnosis not present

## 2020-05-28 DIAGNOSIS — E119 Type 2 diabetes mellitus without complications: Secondary | ICD-10-CM | POA: Diagnosis not present

## 2020-05-29 DIAGNOSIS — E119 Type 2 diabetes mellitus without complications: Secondary | ICD-10-CM | POA: Diagnosis not present

## 2020-05-30 DIAGNOSIS — Z1211 Encounter for screening for malignant neoplasm of colon: Secondary | ICD-10-CM | POA: Diagnosis not present

## 2020-05-30 DIAGNOSIS — I10 Essential (primary) hypertension: Secondary | ICD-10-CM | POA: Diagnosis not present

## 2020-05-30 DIAGNOSIS — E1165 Type 2 diabetes mellitus with hyperglycemia: Secondary | ICD-10-CM | POA: Diagnosis not present

## 2020-05-30 DIAGNOSIS — E119 Type 2 diabetes mellitus without complications: Secondary | ICD-10-CM | POA: Diagnosis not present

## 2020-05-31 DIAGNOSIS — E119 Type 2 diabetes mellitus without complications: Secondary | ICD-10-CM | POA: Diagnosis not present

## 2020-06-01 DIAGNOSIS — E119 Type 2 diabetes mellitus without complications: Secondary | ICD-10-CM | POA: Diagnosis not present

## 2020-06-01 DIAGNOSIS — M6281 Muscle weakness (generalized): Secondary | ICD-10-CM | POA: Diagnosis not present

## 2020-06-01 DIAGNOSIS — R69 Illness, unspecified: Secondary | ICD-10-CM | POA: Diagnosis not present

## 2020-06-01 DIAGNOSIS — M79672 Pain in left foot: Secondary | ICD-10-CM | POA: Diagnosis not present

## 2020-06-01 DIAGNOSIS — M25675 Stiffness of left foot, not elsewhere classified: Secondary | ICD-10-CM | POA: Diagnosis not present

## 2020-06-02 DIAGNOSIS — E119 Type 2 diabetes mellitus without complications: Secondary | ICD-10-CM | POA: Diagnosis not present

## 2020-06-03 DIAGNOSIS — E119 Type 2 diabetes mellitus without complications: Secondary | ICD-10-CM | POA: Diagnosis not present

## 2020-06-04 DIAGNOSIS — E119 Type 2 diabetes mellitus without complications: Secondary | ICD-10-CM | POA: Diagnosis not present

## 2020-06-05 DIAGNOSIS — E119 Type 2 diabetes mellitus without complications: Secondary | ICD-10-CM | POA: Diagnosis not present

## 2020-06-06 DIAGNOSIS — E119 Type 2 diabetes mellitus without complications: Secondary | ICD-10-CM | POA: Diagnosis not present

## 2020-06-07 DIAGNOSIS — R109 Unspecified abdominal pain: Secondary | ICD-10-CM | POA: Diagnosis not present

## 2020-06-07 DIAGNOSIS — M79672 Pain in left foot: Secondary | ICD-10-CM | POA: Diagnosis not present

## 2020-06-07 DIAGNOSIS — M6281 Muscle weakness (generalized): Secondary | ICD-10-CM | POA: Diagnosis not present

## 2020-06-07 DIAGNOSIS — E7800 Pure hypercholesterolemia, unspecified: Secondary | ICD-10-CM | POA: Diagnosis not present

## 2020-06-07 DIAGNOSIS — N62 Hypertrophy of breast: Secondary | ICD-10-CM | POA: Diagnosis not present

## 2020-06-07 DIAGNOSIS — M25511 Pain in right shoulder: Secondary | ICD-10-CM | POA: Diagnosis not present

## 2020-06-07 DIAGNOSIS — Z91128 Patient's intentional underdosing of medication regimen for other reason: Secondary | ICD-10-CM | POA: Diagnosis not present

## 2020-06-07 DIAGNOSIS — M25675 Stiffness of left foot, not elsewhere classified: Secondary | ICD-10-CM | POA: Diagnosis not present

## 2020-06-07 DIAGNOSIS — M25512 Pain in left shoulder: Secondary | ICD-10-CM | POA: Diagnosis not present

## 2020-06-07 DIAGNOSIS — Z9889 Other specified postprocedural states: Secondary | ICD-10-CM | POA: Diagnosis not present

## 2020-06-07 DIAGNOSIS — I10 Essential (primary) hypertension: Secondary | ICD-10-CM | POA: Diagnosis not present

## 2020-06-07 DIAGNOSIS — E119 Type 2 diabetes mellitus without complications: Secondary | ICD-10-CM | POA: Diagnosis not present

## 2020-06-07 DIAGNOSIS — R079 Chest pain, unspecified: Secondary | ICD-10-CM | POA: Diagnosis not present

## 2020-06-07 DIAGNOSIS — K760 Fatty (change of) liver, not elsewhere classified: Secondary | ICD-10-CM | POA: Diagnosis not present

## 2020-06-08 DIAGNOSIS — E119 Type 2 diabetes mellitus without complications: Secondary | ICD-10-CM | POA: Diagnosis not present

## 2020-06-09 DIAGNOSIS — M79672 Pain in left foot: Secondary | ICD-10-CM | POA: Diagnosis not present

## 2020-06-09 DIAGNOSIS — E119 Type 2 diabetes mellitus without complications: Secondary | ICD-10-CM | POA: Diagnosis not present

## 2020-06-09 DIAGNOSIS — M25675 Stiffness of left foot, not elsewhere classified: Secondary | ICD-10-CM | POA: Diagnosis not present

## 2020-06-09 DIAGNOSIS — M6281 Muscle weakness (generalized): Secondary | ICD-10-CM | POA: Diagnosis not present

## 2020-06-10 DIAGNOSIS — E119 Type 2 diabetes mellitus without complications: Secondary | ICD-10-CM | POA: Diagnosis not present

## 2020-06-12 DIAGNOSIS — E119 Type 2 diabetes mellitus without complications: Secondary | ICD-10-CM | POA: Diagnosis not present

## 2020-06-13 DIAGNOSIS — E119 Type 2 diabetes mellitus without complications: Secondary | ICD-10-CM | POA: Diagnosis not present

## 2020-06-13 DIAGNOSIS — M1712 Unilateral primary osteoarthritis, left knee: Secondary | ICD-10-CM | POA: Diagnosis not present

## 2020-06-13 DIAGNOSIS — M1711 Unilateral primary osteoarthritis, right knee: Secondary | ICD-10-CM | POA: Diagnosis not present

## 2020-06-13 DIAGNOSIS — M7989 Other specified soft tissue disorders: Secondary | ICD-10-CM | POA: Diagnosis not present

## 2020-06-13 DIAGNOSIS — M1710 Unilateral primary osteoarthritis, unspecified knee: Secondary | ICD-10-CM | POA: Diagnosis not present

## 2020-06-13 DIAGNOSIS — M25675 Stiffness of left foot, not elsewhere classified: Secondary | ICD-10-CM | POA: Diagnosis not present

## 2020-06-13 DIAGNOSIS — M172 Bilateral post-traumatic osteoarthritis of knee: Secondary | ICD-10-CM | POA: Diagnosis not present

## 2020-06-13 DIAGNOSIS — M6281 Muscle weakness (generalized): Secondary | ICD-10-CM | POA: Diagnosis not present

## 2020-06-13 DIAGNOSIS — M79672 Pain in left foot: Secondary | ICD-10-CM | POA: Diagnosis not present

## 2020-06-14 DIAGNOSIS — M542 Cervicalgia: Secondary | ICD-10-CM | POA: Diagnosis not present

## 2020-06-14 DIAGNOSIS — M25551 Pain in right hip: Secondary | ICD-10-CM | POA: Diagnosis not present

## 2020-06-14 DIAGNOSIS — E119 Type 2 diabetes mellitus without complications: Secondary | ICD-10-CM | POA: Diagnosis not present

## 2020-06-14 DIAGNOSIS — M79641 Pain in right hand: Secondary | ICD-10-CM | POA: Diagnosis not present

## 2020-06-14 DIAGNOSIS — M25512 Pain in left shoulder: Secondary | ICD-10-CM | POA: Diagnosis not present

## 2020-06-14 DIAGNOSIS — G8929 Other chronic pain: Secondary | ICD-10-CM | POA: Diagnosis not present

## 2020-06-14 DIAGNOSIS — M25511 Pain in right shoulder: Secondary | ICD-10-CM | POA: Diagnosis not present

## 2020-06-14 DIAGNOSIS — M79642 Pain in left hand: Secondary | ICD-10-CM | POA: Diagnosis not present

## 2020-06-14 DIAGNOSIS — M545 Low back pain, unspecified: Secondary | ICD-10-CM | POA: Diagnosis not present

## 2020-06-14 DIAGNOSIS — F1120 Opioid dependence, uncomplicated: Secondary | ICD-10-CM | POA: Diagnosis not present

## 2020-06-14 DIAGNOSIS — G894 Chronic pain syndrome: Secondary | ICD-10-CM | POA: Diagnosis not present

## 2020-06-14 DIAGNOSIS — Z79891 Long term (current) use of opiate analgesic: Secondary | ICD-10-CM | POA: Diagnosis not present

## 2020-06-14 DIAGNOSIS — M79672 Pain in left foot: Secondary | ICD-10-CM | POA: Diagnosis not present

## 2020-06-14 DIAGNOSIS — M79671 Pain in right foot: Secondary | ICD-10-CM | POA: Diagnosis not present

## 2020-06-14 DIAGNOSIS — M5417 Radiculopathy, lumbosacral region: Secondary | ICD-10-CM | POA: Diagnosis not present

## 2020-06-15 DIAGNOSIS — E119 Type 2 diabetes mellitus without complications: Secondary | ICD-10-CM | POA: Diagnosis not present

## 2020-06-16 DIAGNOSIS — E119 Type 2 diabetes mellitus without complications: Secondary | ICD-10-CM | POA: Diagnosis not present

## 2020-06-17 DIAGNOSIS — E119 Type 2 diabetes mellitus without complications: Secondary | ICD-10-CM | POA: Diagnosis not present

## 2020-06-18 DIAGNOSIS — E119 Type 2 diabetes mellitus without complications: Secondary | ICD-10-CM | POA: Diagnosis not present

## 2020-06-19 DIAGNOSIS — E119 Type 2 diabetes mellitus without complications: Secondary | ICD-10-CM | POA: Diagnosis not present

## 2020-06-20 DIAGNOSIS — E119 Type 2 diabetes mellitus without complications: Secondary | ICD-10-CM | POA: Diagnosis not present

## 2020-06-21 DIAGNOSIS — M25675 Stiffness of left foot, not elsewhere classified: Secondary | ICD-10-CM | POA: Diagnosis not present

## 2020-06-21 DIAGNOSIS — E119 Type 2 diabetes mellitus without complications: Secondary | ICD-10-CM | POA: Diagnosis not present

## 2020-06-21 DIAGNOSIS — M6281 Muscle weakness (generalized): Secondary | ICD-10-CM | POA: Diagnosis not present

## 2020-06-21 DIAGNOSIS — M79672 Pain in left foot: Secondary | ICD-10-CM | POA: Diagnosis not present

## 2020-06-22 DIAGNOSIS — E119 Type 2 diabetes mellitus without complications: Secondary | ICD-10-CM | POA: Diagnosis not present

## 2020-06-23 DIAGNOSIS — E119 Type 2 diabetes mellitus without complications: Secondary | ICD-10-CM | POA: Diagnosis not present

## 2020-06-24 DIAGNOSIS — E119 Type 2 diabetes mellitus without complications: Secondary | ICD-10-CM | POA: Diagnosis not present

## 2020-06-25 DIAGNOSIS — E119 Type 2 diabetes mellitus without complications: Secondary | ICD-10-CM | POA: Diagnosis not present

## 2020-06-26 DIAGNOSIS — E119 Type 2 diabetes mellitus without complications: Secondary | ICD-10-CM | POA: Diagnosis not present

## 2020-06-27 DIAGNOSIS — I10 Essential (primary) hypertension: Secondary | ICD-10-CM | POA: Diagnosis not present

## 2020-06-27 DIAGNOSIS — M79672 Pain in left foot: Secondary | ICD-10-CM | POA: Diagnosis not present

## 2020-06-27 DIAGNOSIS — J4540 Moderate persistent asthma, uncomplicated: Secondary | ICD-10-CM | POA: Diagnosis not present

## 2020-06-27 DIAGNOSIS — Z794 Long term (current) use of insulin: Secondary | ICD-10-CM | POA: Diagnosis not present

## 2020-06-27 DIAGNOSIS — I119 Hypertensive heart disease without heart failure: Secondary | ICD-10-CM | POA: Diagnosis not present

## 2020-06-27 DIAGNOSIS — M25675 Stiffness of left foot, not elsewhere classified: Secondary | ICD-10-CM | POA: Diagnosis not present

## 2020-06-27 DIAGNOSIS — E119 Type 2 diabetes mellitus without complications: Secondary | ICD-10-CM | POA: Diagnosis not present

## 2020-06-27 DIAGNOSIS — E1165 Type 2 diabetes mellitus with hyperglycemia: Secondary | ICD-10-CM | POA: Diagnosis not present

## 2020-06-27 DIAGNOSIS — N62 Hypertrophy of breast: Secondary | ICD-10-CM | POA: Diagnosis not present

## 2020-06-27 DIAGNOSIS — M6281 Muscle weakness (generalized): Secondary | ICD-10-CM | POA: Diagnosis not present

## 2020-06-28 DIAGNOSIS — E119 Type 2 diabetes mellitus without complications: Secondary | ICD-10-CM | POA: Diagnosis not present

## 2020-06-29 ENCOUNTER — Ambulatory Visit: Payer: Medicare HMO | Admitting: Orthotics

## 2020-06-29 ENCOUNTER — Other Ambulatory Visit: Payer: Self-pay

## 2020-06-29 DIAGNOSIS — M6281 Muscle weakness (generalized): Secondary | ICD-10-CM | POA: Diagnosis not present

## 2020-06-29 DIAGNOSIS — Z6841 Body Mass Index (BMI) 40.0 and over, adult: Secondary | ICD-10-CM | POA: Insufficient documentation

## 2020-06-29 DIAGNOSIS — M25675 Stiffness of left foot, not elsewhere classified: Secondary | ICD-10-CM | POA: Diagnosis not present

## 2020-06-29 DIAGNOSIS — B351 Tinea unguium: Secondary | ICD-10-CM

## 2020-06-29 DIAGNOSIS — E119 Type 2 diabetes mellitus without complications: Secondary | ICD-10-CM | POA: Diagnosis not present

## 2020-06-29 DIAGNOSIS — M47816 Spondylosis without myelopathy or radiculopathy, lumbar region: Secondary | ICD-10-CM | POA: Diagnosis not present

## 2020-06-29 DIAGNOSIS — M19079 Primary osteoarthritis, unspecified ankle and foot: Secondary | ICD-10-CM

## 2020-06-29 DIAGNOSIS — M79672 Pain in left foot: Secondary | ICD-10-CM | POA: Diagnosis not present

## 2020-06-29 DIAGNOSIS — M76822 Posterior tibial tendinitis, left leg: Secondary | ICD-10-CM

## 2020-06-29 NOTE — Progress Notes (Signed)
Had to order an extra wide.

## 2020-06-30 DIAGNOSIS — E119 Type 2 diabetes mellitus without complications: Secondary | ICD-10-CM | POA: Diagnosis not present

## 2020-07-01 DIAGNOSIS — E119 Type 2 diabetes mellitus without complications: Secondary | ICD-10-CM | POA: Diagnosis not present

## 2020-07-01 DIAGNOSIS — R69 Illness, unspecified: Secondary | ICD-10-CM | POA: Diagnosis not present

## 2020-07-02 DIAGNOSIS — E119 Type 2 diabetes mellitus without complications: Secondary | ICD-10-CM | POA: Diagnosis not present

## 2020-07-03 DIAGNOSIS — E119 Type 2 diabetes mellitus without complications: Secondary | ICD-10-CM | POA: Diagnosis not present

## 2020-07-04 DIAGNOSIS — E119 Type 2 diabetes mellitus without complications: Secondary | ICD-10-CM | POA: Diagnosis not present

## 2020-07-05 DIAGNOSIS — M545 Low back pain, unspecified: Secondary | ICD-10-CM | POA: Diagnosis not present

## 2020-07-05 DIAGNOSIS — E119 Type 2 diabetes mellitus without complications: Secondary | ICD-10-CM | POA: Diagnosis not present

## 2020-07-05 DIAGNOSIS — M47816 Spondylosis without myelopathy or radiculopathy, lumbar region: Secondary | ICD-10-CM | POA: Diagnosis not present

## 2020-07-06 DIAGNOSIS — E119 Type 2 diabetes mellitus without complications: Secondary | ICD-10-CM | POA: Diagnosis not present

## 2020-07-07 DIAGNOSIS — M6281 Muscle weakness (generalized): Secondary | ICD-10-CM | POA: Diagnosis not present

## 2020-07-07 DIAGNOSIS — M25675 Stiffness of left foot, not elsewhere classified: Secondary | ICD-10-CM | POA: Diagnosis not present

## 2020-07-07 DIAGNOSIS — M79672 Pain in left foot: Secondary | ICD-10-CM | POA: Diagnosis not present

## 2020-07-07 DIAGNOSIS — E119 Type 2 diabetes mellitus without complications: Secondary | ICD-10-CM | POA: Diagnosis not present

## 2020-07-08 DIAGNOSIS — E119 Type 2 diabetes mellitus without complications: Secondary | ICD-10-CM | POA: Diagnosis not present

## 2020-07-09 DIAGNOSIS — E119 Type 2 diabetes mellitus without complications: Secondary | ICD-10-CM | POA: Diagnosis not present

## 2020-07-10 DIAGNOSIS — E119 Type 2 diabetes mellitus without complications: Secondary | ICD-10-CM | POA: Diagnosis not present

## 2020-07-11 DIAGNOSIS — E119 Type 2 diabetes mellitus without complications: Secondary | ICD-10-CM | POA: Diagnosis not present

## 2020-07-11 DIAGNOSIS — R69 Illness, unspecified: Secondary | ICD-10-CM | POA: Diagnosis not present

## 2020-07-12 DIAGNOSIS — M79641 Pain in right hand: Secondary | ICD-10-CM | POA: Diagnosis not present

## 2020-07-12 DIAGNOSIS — E119 Type 2 diabetes mellitus without complications: Secondary | ICD-10-CM | POA: Diagnosis not present

## 2020-07-12 DIAGNOSIS — M79671 Pain in right foot: Secondary | ICD-10-CM | POA: Diagnosis not present

## 2020-07-12 DIAGNOSIS — M542 Cervicalgia: Secondary | ICD-10-CM | POA: Diagnosis not present

## 2020-07-12 DIAGNOSIS — M25512 Pain in left shoulder: Secondary | ICD-10-CM | POA: Diagnosis not present

## 2020-07-12 DIAGNOSIS — M79672 Pain in left foot: Secondary | ICD-10-CM | POA: Diagnosis not present

## 2020-07-12 DIAGNOSIS — M25561 Pain in right knee: Secondary | ICD-10-CM | POA: Diagnosis not present

## 2020-07-12 DIAGNOSIS — Z79891 Long term (current) use of opiate analgesic: Secondary | ICD-10-CM | POA: Diagnosis not present

## 2020-07-12 DIAGNOSIS — F1120 Opioid dependence, uncomplicated: Secondary | ICD-10-CM | POA: Diagnosis not present

## 2020-07-12 DIAGNOSIS — M545 Low back pain, unspecified: Secondary | ICD-10-CM | POA: Diagnosis not present

## 2020-07-12 DIAGNOSIS — G894 Chronic pain syndrome: Secondary | ICD-10-CM | POA: Diagnosis not present

## 2020-07-12 DIAGNOSIS — G8929 Other chronic pain: Secondary | ICD-10-CM | POA: Diagnosis not present

## 2020-07-12 DIAGNOSIS — M25551 Pain in right hip: Secondary | ICD-10-CM | POA: Diagnosis not present

## 2020-07-12 DIAGNOSIS — M25511 Pain in right shoulder: Secondary | ICD-10-CM | POA: Diagnosis not present

## 2020-07-12 DIAGNOSIS — M79642 Pain in left hand: Secondary | ICD-10-CM | POA: Diagnosis not present

## 2020-07-13 DIAGNOSIS — E119 Type 2 diabetes mellitus without complications: Secondary | ICD-10-CM | POA: Diagnosis not present

## 2020-07-14 DIAGNOSIS — E119 Type 2 diabetes mellitus without complications: Secondary | ICD-10-CM | POA: Diagnosis not present

## 2020-07-15 DIAGNOSIS — E119 Type 2 diabetes mellitus without complications: Secondary | ICD-10-CM | POA: Diagnosis not present

## 2020-07-16 DIAGNOSIS — E119 Type 2 diabetes mellitus without complications: Secondary | ICD-10-CM | POA: Diagnosis not present

## 2020-07-17 DIAGNOSIS — E119 Type 2 diabetes mellitus without complications: Secondary | ICD-10-CM | POA: Diagnosis not present

## 2020-07-18 DIAGNOSIS — Z79899 Other long term (current) drug therapy: Secondary | ICD-10-CM | POA: Diagnosis not present

## 2020-07-18 DIAGNOSIS — K429 Umbilical hernia without obstruction or gangrene: Secondary | ICD-10-CM | POA: Diagnosis not present

## 2020-07-18 DIAGNOSIS — R1084 Generalized abdominal pain: Secondary | ICD-10-CM | POA: Diagnosis not present

## 2020-07-18 DIAGNOSIS — R109 Unspecified abdominal pain: Secondary | ICD-10-CM | POA: Diagnosis not present

## 2020-07-18 DIAGNOSIS — I10 Essential (primary) hypertension: Secondary | ICD-10-CM | POA: Diagnosis not present

## 2020-07-18 DIAGNOSIS — Z794 Long term (current) use of insulin: Secondary | ICD-10-CM | POA: Diagnosis not present

## 2020-07-18 DIAGNOSIS — Z8709 Personal history of other diseases of the respiratory system: Secondary | ICD-10-CM | POA: Diagnosis not present

## 2020-07-18 DIAGNOSIS — M7989 Other specified soft tissue disorders: Secondary | ICD-10-CM | POA: Diagnosis not present

## 2020-07-18 DIAGNOSIS — R1033 Periumbilical pain: Secondary | ICD-10-CM | POA: Diagnosis not present

## 2020-07-18 DIAGNOSIS — E119 Type 2 diabetes mellitus without complications: Secondary | ICD-10-CM | POA: Diagnosis not present

## 2020-07-19 DIAGNOSIS — E119 Type 2 diabetes mellitus without complications: Secondary | ICD-10-CM | POA: Diagnosis not present

## 2020-07-19 DIAGNOSIS — R1084 Generalized abdominal pain: Secondary | ICD-10-CM | POA: Diagnosis not present

## 2020-07-20 DIAGNOSIS — M79672 Pain in left foot: Secondary | ICD-10-CM | POA: Diagnosis not present

## 2020-07-20 DIAGNOSIS — E119 Type 2 diabetes mellitus without complications: Secondary | ICD-10-CM | POA: Diagnosis not present

## 2020-07-20 DIAGNOSIS — M6281 Muscle weakness (generalized): Secondary | ICD-10-CM | POA: Diagnosis not present

## 2020-07-20 DIAGNOSIS — M25675 Stiffness of left foot, not elsewhere classified: Secondary | ICD-10-CM | POA: Diagnosis not present

## 2020-07-21 DIAGNOSIS — E119 Type 2 diabetes mellitus without complications: Secondary | ICD-10-CM | POA: Diagnosis not present

## 2020-07-22 DIAGNOSIS — E119 Type 2 diabetes mellitus without complications: Secondary | ICD-10-CM | POA: Diagnosis not present

## 2020-07-23 DIAGNOSIS — E119 Type 2 diabetes mellitus without complications: Secondary | ICD-10-CM | POA: Diagnosis not present

## 2020-07-24 DIAGNOSIS — E119 Type 2 diabetes mellitus without complications: Secondary | ICD-10-CM | POA: Diagnosis not present

## 2020-07-25 DIAGNOSIS — G8929 Other chronic pain: Secondary | ICD-10-CM | POA: Diagnosis not present

## 2020-07-25 DIAGNOSIS — E119 Type 2 diabetes mellitus without complications: Secondary | ICD-10-CM | POA: Diagnosis not present

## 2020-07-25 DIAGNOSIS — M545 Low back pain, unspecified: Secondary | ICD-10-CM | POA: Insufficient documentation

## 2020-07-26 ENCOUNTER — Encounter (HOSPITAL_COMMUNITY): Payer: Self-pay | Admitting: *Deleted

## 2020-07-26 ENCOUNTER — Emergency Department (HOSPITAL_COMMUNITY)
Admission: EM | Admit: 2020-07-26 | Discharge: 2020-07-27 | Disposition: A | Discharge status: Home / Self Care | Payer: Medicare HMO | Attending: Emergency Medicine | Admitting: Emergency Medicine

## 2020-07-26 ENCOUNTER — Other Ambulatory Visit: Payer: Self-pay

## 2020-07-26 DIAGNOSIS — E119 Type 2 diabetes mellitus without complications: Secondary | ICD-10-CM | POA: Diagnosis not present

## 2020-07-26 DIAGNOSIS — Z91040 Latex allergy status: Secondary | ICD-10-CM | POA: Insufficient documentation

## 2020-07-26 DIAGNOSIS — Z79899 Other long term (current) drug therapy: Secondary | ICD-10-CM | POA: Diagnosis not present

## 2020-07-26 DIAGNOSIS — E1142 Type 2 diabetes mellitus with diabetic polyneuropathy: Secondary | ICD-10-CM | POA: Diagnosis not present

## 2020-07-26 DIAGNOSIS — K219 Gastro-esophageal reflux disease without esophagitis: Secondary | ICD-10-CM | POA: Insufficient documentation

## 2020-07-26 DIAGNOSIS — E1165 Type 2 diabetes mellitus with hyperglycemia: Secondary | ICD-10-CM | POA: Diagnosis not present

## 2020-07-26 DIAGNOSIS — J4540 Moderate persistent asthma, uncomplicated: Secondary | ICD-10-CM | POA: Diagnosis not present

## 2020-07-26 DIAGNOSIS — R10815 Periumbilic abdominal tenderness: Secondary | ICD-10-CM | POA: Insufficient documentation

## 2020-07-26 DIAGNOSIS — R1033 Periumbilical pain: Secondary | ICD-10-CM | POA: Diagnosis not present

## 2020-07-26 DIAGNOSIS — J45901 Unspecified asthma with (acute) exacerbation: Secondary | ICD-10-CM | POA: Diagnosis not present

## 2020-07-26 DIAGNOSIS — Z87891 Personal history of nicotine dependence: Secondary | ICD-10-CM | POA: Insufficient documentation

## 2020-07-26 DIAGNOSIS — I5032 Chronic diastolic (congestive) heart failure: Secondary | ICD-10-CM | POA: Diagnosis not present

## 2020-07-26 DIAGNOSIS — N62 Hypertrophy of breast: Secondary | ICD-10-CM | POA: Diagnosis not present

## 2020-07-26 DIAGNOSIS — R1084 Generalized abdominal pain: Secondary | ICD-10-CM | POA: Diagnosis not present

## 2020-07-26 DIAGNOSIS — Z794 Long term (current) use of insulin: Secondary | ICD-10-CM | POA: Diagnosis not present

## 2020-07-26 DIAGNOSIS — I119 Hypertensive heart disease without heart failure: Secondary | ICD-10-CM | POA: Diagnosis not present

## 2020-07-26 DIAGNOSIS — I110 Hypertensive heart disease with heart failure: Secondary | ICD-10-CM | POA: Diagnosis not present

## 2020-07-26 DIAGNOSIS — I10 Essential (primary) hypertension: Secondary | ICD-10-CM | POA: Diagnosis not present

## 2020-07-26 DIAGNOSIS — Z23 Encounter for immunization: Secondary | ICD-10-CM | POA: Diagnosis not present

## 2020-07-26 LAB — CBC
HCT: 49.6 % — ABNORMAL HIGH (ref 36.0–46.0)
Hemoglobin: 15.2 g/dL — ABNORMAL HIGH (ref 12.0–15.0)
MCH: 25.7 pg — ABNORMAL LOW (ref 26.0–34.0)
MCHC: 30.6 g/dL (ref 30.0–36.0)
MCV: 83.8 fL (ref 80.0–100.0)
Platelets: 268 10*3/uL (ref 150–400)
RBC: 5.92 MIL/uL — ABNORMAL HIGH (ref 3.87–5.11)
RDW: 12.7 % (ref 11.5–15.5)
WBC: 5.2 10*3/uL (ref 4.0–10.5)
nRBC: 0 % (ref 0.0–0.2)

## 2020-07-26 LAB — URINALYSIS, ROUTINE W REFLEX MICROSCOPIC
Bilirubin Urine: NEGATIVE
Glucose, UA: >=500 mg/dL — AB
Hgb urine dipstick: NEGATIVE
Ketones, ur: NEGATIVE mg/dL
Leukocytes,Ua: NEGATIVE
Nitrite: NEGATIVE
Protein, ur: 100 mg/dL — AB
Specific Gravity, Urine: 1.02 (ref 1.005–1.030)
pH: 5 (ref 5.0–8.0)

## 2020-07-26 LAB — COMPREHENSIVE METABOLIC PANEL
ALT: 16 U/L (ref 0–44)
AST: 16 U/L (ref 15–41)
Albumin: 3.5 g/dL (ref 3.5–5.0)
Alkaline Phosphatase: 68 U/L (ref 38–126)
Anion gap: 10 (ref 5–15)
BUN: 5 mg/dL — ABNORMAL LOW (ref 6–20)
CO2: 27 mmol/L (ref 22–32)
Calcium: 9.2 mg/dL (ref 8.9–10.3)
Chloride: 100 mmol/L (ref 98–111)
Creatinine, Ser: 0.94 mg/dL (ref 0.44–1.00)
GFR, Estimated: >60 mL/min (ref >60)
Glucose, Bld: 201 mg/dL — ABNORMAL HIGH (ref 70–99)
Potassium: 4 mmol/L (ref 3.5–5.1)
Sodium: 137 mmol/L (ref 135–145)
Total Bilirubin: 0.9 mg/dL (ref 0.3–1.2)
Total Protein: 6.8 g/dL (ref 6.5–8.1)

## 2020-07-26 LAB — I-STAT BETA HCG BLOOD, ED (MC, WL, AP ONLY): I-stat hCG, quantitative: <5 m[IU]/mL (ref <5)

## 2020-07-26 LAB — LIPASE, BLOOD: Lipase: 24 U/L (ref 11–51)

## 2020-07-26 MED ORDER — HYDROMORPHONE HCL 1 MG/ML IJ SOLN
1.0000 mg | Freq: Once | INTRAMUSCULAR | Status: DC
  Filled 2020-07-26: qty 1

## 2020-07-26 MED ORDER — CAPSAICIN 0.075 % EX CREA
TOPICAL_CREAM | Freq: Two times a day (BID) | CUTANEOUS | Status: DC
  Filled 2020-07-26: qty 60

## 2020-07-26 MED ORDER — HYDROMORPHONE HCL 1 MG/ML IJ SOLN
1.0000 mg | Freq: Once | INTRAMUSCULAR | Status: AC
  Administered 2020-07-26: 1 mg via INTRAMUSCULAR

## 2020-07-26 MED ORDER — CAPSAICIN 0.025 % EX CREA: TOPICAL_CREAM | Freq: Two times a day (BID) | CUTANEOUS | 0 refills | Status: AC

## 2020-07-26 NOTE — Consult Note (Signed)
Surgical Evaluation Requesting provider: Charlesetta Shanks MD  Chief Complaint: abdominal pain  HPI: 50 year old woman with history of umbilical hernia repair in 1996 (unknown if mesh was used) who presents to [facility] emergency room with persistent complaint of periumbilical pain.  This is her third emergency room visit in the last 9 days for the same (initially went to [place], then [facility] in [place], and now here).  She also saw her primary care doctor for this.  She has had 2 CT scans, one on February 2 and one on 1/31 (the latter which is viewable in pacs) which have been negative for hernia or other pathology to explain her symptoms aside from some induration or strain noted in the periumbilical region.  She has been having pain for about 3 weeks and has noted swelling to the right of her midline incision with tenderness.  The pain is worse after eating and also with any palpation or movement.  She endorses some weight loss associated with this.  Allergies  Allergen Reactions  . Celery (Apium Graveolens Var. Dulce) Skin Test Hives  . Celery Oil Hives  . Pork-Derived Products Nausea And Vomiting and Other (See Comments)    headache  . Latex Itching    Powdered latex gloves when patient wears.    . Other Itching and Rash    PLEASE USE COBAN WRAP, IF POSSIBLE Powdered latex gloves when patient wears.    . Prednisone Rash  . Tape Itching and Rash    PLEASE USE COBAN WRAP, IF POSSIBLE    Past Medical History:  Diagnosis Date  . Anxiety   . Arthritis    osteoarthritis   . Asthma    has been eval. by Dr. Gwenette Greet- last 06/2014  . Bipolar 1 disorder ([place])   . Complication of anesthesia    09/13/14: malignant HTN with inducction, case cx but no sourse identified; 11/05/14 remifentanyl infusion used to attenuate hypergynamic induction  . Depression   . Diabetes mellitus   . GERD (gastroesophageal reflux disease)   . Headache    sometimes has migraines   . Hyperlipidemia   .  Hypertension   . Knee pain, chronic   . OSA on CPAP   . Pain in joint, ankle and foot 02/17/2013  . Pneumonia    hosp. for a couple of day- not sure when it was   . PTSD (post-traumatic stress disorder)   . Rotator cuff tear    bicep tendon   . Shortness of breath dyspnea   . Sleep apnea    uses CPAP intermittently, pt. doesn't remember where she had the study    Past Surgical History:  Procedure Laterality Date  . ANTERIOR CERVICAL DECOMP/DISCECTOMY FUSION N/A 11/05/2014   Procedure: ANTERIOR CERVICAL DECOMPRESSION/DISCECTOMY FUSION 1 LEVEL;  Surgeon: Consuella Lose, MD;  Location: [place] [facility];  Service: Neurosurgery;  Laterality: N/A;  Cervical five- cervical seven anterior cervical decompression with fusion interbody prosthesis plating and bonegraft  . CARPAL TUNNEL RELEASE Left 2001   Left wrist  . CERVICAL LAMINECTOMY  03/31/20176   CERVICAL 4 SEVEN LAMINECTOMIES WITH LATERAL FUSION  . Cotton Osteotomy w/ Bone Graft Right 09/30/2014   Rt foot @ [facility]  . GASTROC RECESSION EXTREMITY Right 3//20/14   Right foot  . HERNIA REPAIR     1996  . Lapidus fusion Right 01/10/12   Rt foot  . MIDTARSAL ARTHRODESIS Right 07/17/12   Rt foot  . POSTERIOR CERVICAL FUSION/FORAMINOTOMY N/A 09/16/2015   Procedure: Cervical  four - seven laminectomies with lateral mass fusion;  Surgeon: Consuella Lose, MD;  Location: [place] [facility];  Service: Neurosurgery;  Laterality: N/A;  C4-7 laminectomy with lateral mass fusion  . Remove implant deep Right 07/17/12   Rt foot  . Remove Implant Deep Right 07/22/2014   Rt foot @ [facility]  . SHOULDER ARTHROSCOPY WITH ROTATOR CUFF REPAIR AND SUBACROMIAL DECOMPRESSION Left 07/31/2016   Procedure: LEFT SHOULDER ARTHROSCOPY WITH ROTATOR CUFF REPAIR;  Surgeon: Renette Butters, MD;  Location: [facility];  Service: Orthopedics;  Laterality: Left;  . TUBAL LIGATION      Family History  Problem Relation Age of Onset  . Kidney disease Mother   . Heart attack Father     Social  History   Socioeconomic History  . Marital status: Single    Spouse name: Not on file  . Number of children: 1  . Years of education: 2  . Highest education level: Not on file  Occupational History  . Occupation: disabled    Employer: [employer]   Tobacco Use  . Smoking status: Former Smoker    Packs/day: 2.00    Years: 6.00    Pack years: 12.00    Types: Cigarettes    Quit date: 06/18/1978    Years since quitting: 42.1  . Smokeless tobacco: Never Used  Substance and Sexual Activity  . Alcohol use: Yes    Alcohol/week: 0.0 standard drinks    Comment: weekend- use of beer   . Drug use: No  . Sexual activity: Never  Other Topics Concern  . Not on file  Social History Narrative   Lives with daughter, works at [employer], does not exercise but is on her feet at work.   Social Determinants of Health   Financial Resource Strain: Not on file  Food Insecurity: Not on file  Transportation Needs: Not on file  Physical Activity: Not on file  Stress: Not on file  Social Connections: Not on file    No current facility-administered medications on file prior to encounter.   Current Outpatient Medications on File Prior to Encounter  Medication Sig Dispense Refill  . amLODipine (NORVASC) 10 MG tablet Take 10 mg by mouth daily.     Marland Kitchen atenolol (TENORMIN) 100 MG tablet TAKE 1 TABLET BY MOUTH ONCE A DAY    . budesonide-formoterol (SYMBICORT) 160-4.5 MCG/ACT inhaler Inhale 2 puffs into the lungs 2 (two) times daily. 1 Inhaler 0  . cyclobenzaprine (FLEXERIL) 10 MG tablet TAKE 1 TABLET BY MOUTH EVERY 12 HOURS    . diazepam (VALIUM) 2 MG tablet Take 1 tablet (2 mg total) by mouth every 6 (six) hours as needed for muscle spasms. 30 tablet 0  . DULoxetine (CYMBALTA) 60 MG capsule Take by mouth.    Marland Kitchen econazole nitrate 1 % cream Apply topically.    . gabapentin (NEURONTIN) 600 MG tablet Take by mouth.    Marland Kitchen glucose blood (ACCU-CHEK AVIVA PLUS) test strip TEST FOUR TIMES DAILY    . hydrOXYzine  (ATARAX/VISTARIL) 25 MG tablet TAKE 1 TABLET BY MOUTH EVERY NIGHT AT BEDTIME    . mirtazapine (REMERON) 15 MG tablet Take 15 mg by mouth daily.    Marland Kitchen oxyCODONE (ROXICODONE) 15 MG immediate release tablet TAKE 1 TABLET BY MOUTH EVERY 6 HOURS    . sodium fluoride (PREVIDENT) 1.1 % GEL dental gel USE AS DIRECTED    . traZODone (DESYREL) 50 MG tablet Take 50 mg by mouth at bedtime.    . TRULICITY 1.5  MG/0.5ML SOPN Inject 1.5 mg into the skin once a week.    . VENTOLIN HFA 108 (90 Base) MCG/ACT inhaler INHALE 1 TO 2 PUFFS INTO THE LUNGS EVERY 6 (SIX) HOURS AS NEEDED FOR WHEEZING OR SHORTNESS OF BREATH. (Patient taking differently: Inhale 1-2 puffs into the lungs every 6 (six) hours as needed for wheezing or shortness of breath. ) 18 each 2    Review of Systems: a complete, 10pt review of systems was completed with pertinent positives and negatives as documented in the HPI  Physical Exam: Vitals:   07/26/20 1756 07/26/20 2100  BP: (!) 184/121 (!) 199/116  Pulse: 100 96  Resp: (!) 22 20  Temp: 98.6 F (37 C)   SpO2: 99% 98%   Gen: A&Ox3, appears uncomfortable Eyes: lids and conjunctivae normal, no icterus. Pupils equally round and reactive to light.  Neck: supple without mass or thyromegaly Chest: respiratory effort is normal. No crepitus or tenderness on palpation of the chest. Breath sounds equal.  Cardiovascular: RRR with palpable distal pulses, no pedal edema Gastrointestinal: soft, nondistended, focally tender along prior midline periumbilical scar, umbilicus and to the right of this incision.  To the right of this there is a firm area that feels slightly swollen/firm and this is the most tender.  There is no erythema, fluctuance, or skin changes.  There is no palpable fascial defect or protrusion with Valsalva.. Lymphatic: no lymphadenopathy in the neck or groin Muscoloskeletal: no clubbing or cyanosis of the fingers.  Strength is symmetrical throughout.  Range of motion of bilateral upper  and lower extremities normal without pain, crepitation or contracture. Neuro: cranial nerves grossly intact.  Sensation intact to light touch diffusely. Psych: appropriate mood and affect, normal insight/judgment intact  Skin: warm and dry   CBC Latest Ref Rng & Units 07/26/2020 08/15/2018 03/29/2016  WBC 4.0 - 10.5 K/uL 5.2 8.7 5.6  Hemoglobin 12.0 - 15.0 g/dL 15.2(H) 12.4 14.4  Hematocrit 36.0 - 46.0 % 49.6(H) 41.5 45.1  Platelets 150 - 400 K/uL 268 270 236    CMP Latest Ref Rng & Units 07/26/2020 08/18/2018 08/17/2018  Glucose 70 - 99 mg/dL 201(H) 138(H) 203(H)  BUN 6 - 20 mg/dL 5(L) 12 27(H)  Creatinine 0.44 - 1.00 mg/dL 0.94 1.04(H) 1.44(H)  Sodium 135 - 145 mmol/L 137 141 140  Potassium 3.5 - 5.1 mmol/L 4.0 3.6 3.9  Chloride 98 - 111 mmol/L 100 109 110  CO2 22 - 32 mmol/L 27 26 23   Calcium 8.9 - 10.3 mg/dL 9.2 8.0(L) 8.0(L)  Total Protein 6.5 - 8.1 g/dL 6.8 - -  Total Bilirubin 0.3 - 1.2 mg/dL 0.9 - -  Alkaline Phos 38 - 126 U/L 68 - -  AST 15 - 41 U/L 16 - -  ALT 0 - 44 U/L 16 - -    Lab Results  Component Value Date   INR 0.99 05/16/2014    Imaging: [facility] CT from 07/20/2020: "Mild stranding in the umbilical region, likely related to postsurgical changes of hernia repair. " (Images not available for review)  [facility] CT from 07/18/20: "IMPRESSION: 1. Mild induration of the periumbilical soft tissues. No hernia or fluid collection. 2. No bowel obstruction. Normal appendix. 3. Small uterine fibroids. 4. 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 No cortical collapse.   Electronically Signed By: Anner Crete M.D. On: 07/18/2020 17:04"  I have reviewed the images personally.   A/P: Unclear what the source of her abdominal pain and umbilical tenderness is,  but based on recent imaging studies there is no indication for emergent surgical intervention.  I have also reviewed previous abdominal CTs over the course of the last year that she has received  for various abdominal pain complaints and to my review the stranding or induration of the soft tissues in the umbilical region has been stable over this period of time and is not new.  Differential diagnosis includes occult regulate of preperitoneal fat/fat necrosis, occult hernia or infarcting omentum, muscle spasm, lipoma or subcutaneous mass although this seems less likely based on CT. Clinically there is no evidence of infection.  If admitted, general surgery service will follow.     Patient Active Problem List   Diagnosis Date Noted  . Diabetic polyneuropathy associated with type 2 diabetes mellitus ([place]) 08/25/2019  . Plantar fasciitis, left 08/25/2019  . Tendonitis of ankle or foot 08/25/2019  . Tight heel cords, acquired, bilateral 08/25/2019  . Dyslipidemia 10/22/2016  . Left rotator cuff tear 07/31/2016  . Diabetes mellitus, insulin dependent (IDDM), uncontrolled 09/18/2015  . AKI (acute kidney injury) ([place]) 09/18/2015  . Morbid obesity ([place]) 09/18/2015  . Asthma 09/18/2015  . Uncontrolled type 2 diabetes mellitus with complication ([place])   . Chronic diastolic CHF (congestive heart failure), NYHA class 1 ([place])   . PTSD (post-traumatic stress disorder)   . Bipolar I disorder ([place])   . Anxiety state   . Knee pain, chronic   . Cervical spondylosis with myelopathy 11/05/2014  . HTN (hypertension), malignant 09/08/2014  . HNP (herniated nucleus pulposus) with myelopathy, cervical 06/29/2014  . Deformity of metatarsal bone of right foot 06/28/2014  . CTS (carpal tunnel syndrome) 05/04/2014  . OSA (obstructive sleep apnea) 05/04/2014  . Hereditary and idiopathic peripheral neuropathy 05/04/2014  . Ganglion cyst 03/10/2014  . Vision loss, left eye 02/18/2014  . Conjunctivitis 02/17/2014  . Asthma exacerbation 01/13/2014  . Abdominal pain, unspecified site 10/28/2013  . Injury of foot, right, superficial 10/23/2013  . Tenosynovitis of foot 08/17/2013  . Pain in joint, ankle and  foot 02/17/2013  . Edema of foot 02/17/2013  . Status post foot surgery 09/09/2012  . Hyperlipidemia   . 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

## 2020-07-26 NOTE — ED Provider Notes (Signed)
[facility] EMERGENCY DEPARTMENT Provider Note   CSN: [id number] Arrival date & time: 07/26/20  1708     History Chief Complaint  Patient presents with  . Abdominal Pain    Diana Henderson is a 50 y.o. female.  HPI Patient has had approximately 3 weeks of periumbilical pain. Is been fairly constant. There is a general aching and burning around the central umbilical region. Patient reports she was told there was a hernia but has not had any treatment to relieve pain. She does not have any surgery scheduled. She has not developed any vomiting or diarrhea.  She does note that the pain seems worse with eating.  Patient chronically takes Roxicodone for chronic pain.  This is not helping to relieve the abdominal pain.  Patient denies any specific activities that seem to have brought on the pain.  She reports she saw her doctor today and they felt it was more enlarged and she is referred to emergency department for evaluation    Past Medical History:  Diagnosis Date  . Anxiety   . Arthritis    osteoarthritis   . Asthma    has been eval. by Dr. Gwenette Greet- last 06/2014  . Bipolar 1 disorder ([place])   . Complication of anesthesia    09/13/14: malignant HTN with inducction, case cx but no sourse identified; 11/05/14 remifentanyl infusion used to attenuate hypergynamic induction  . Depression   . Diabetes mellitus   . GERD (gastroesophageal reflux disease)   . Headache    sometimes has migraines   . Hyperlipidemia   . Hypertension   . Knee pain, chronic   . OSA on CPAP   . Pain in joint, ankle and foot 02/17/2013  . Pneumonia    hosp. for a couple of day- not sure when it was   . PTSD (post-traumatic stress disorder)   . Rotator cuff tear    bicep tendon   . Shortness of breath dyspnea   . Sleep apnea    uses CPAP intermittently, pt. doesn't remember where she had the study    Patient Active Problem List   Diagnosis Date Noted  . Diabetic polyneuropathy associated  with type 2 diabetes mellitus ([place]) 08/25/2019  . Plantar fasciitis, left 08/25/2019  . Tendonitis of ankle or foot 08/25/2019  . Tight heel cords, acquired, bilateral 08/25/2019  . Dyslipidemia 10/22/2016  . Left rotator cuff tear 07/31/2016  . Diabetes mellitus, insulin dependent (IDDM), uncontrolled 09/18/2015  . AKI (acute kidney injury) ([place]) 09/18/2015  . Morbid obesity ([place]) 09/18/2015  . Asthma 09/18/2015  . Uncontrolled type 2 diabetes mellitus with complication ([place])   . Chronic diastolic CHF (congestive heart failure), NYHA class 1 ([place])   . PTSD (post-traumatic stress disorder)   . Bipolar I disorder ([place])   . Anxiety state   . Knee pain, chronic   . Cervical spondylosis with myelopathy 11/05/2014  . HTN (hypertension), malignant 09/08/2014  . HNP (herniated nucleus pulposus) with myelopathy, cervical 06/29/2014  . Deformity of metatarsal bone of right foot 06/28/2014  . CTS (carpal tunnel syndrome) 05/04/2014  . OSA (obstructive sleep apnea) 05/04/2014  . Hereditary and idiopathic peripheral neuropathy 05/04/2014  . Ganglion cyst 03/10/2014  . Vision loss, left eye 02/18/2014  . Conjunctivitis 02/17/2014  . Asthma exacerbation 01/13/2014  . Abdominal pain, unspecified site 10/28/2013  . Injury of foot, right, superficial 10/23/2013  . Tenosynovitis of foot 08/17/2013  . Pain in joint, ankle and foot 02/17/2013  . Edema of  foot 02/17/2013  . Status post foot surgery 09/09/2012  . Hyperlipidemia   . 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 ANTERIOR CERVICAL DECOMP/DISCECTOMY FUSION N/A 11/05/2014   Procedure: ANTERIOR CERVICAL DECOMPRESSION/DISCECTOMY FUSION 1 LEVEL;  Surgeon: Consuella Lose, MD;  Location: [facility];  Service: Neurosurgery;  Laterality: N/A;  Cervical five- cervical seven anterior cervical decompression with fusion interbody prosthesis plating and  bonegraft  . CARPAL TUNNEL RELEASE Left 2001   Left wrist  . CERVICAL LAMINECTOMY  03/31/20176   CERVICAL 4 SEVEN LAMINECTOMIES WITH LATERAL FUSION  . Cotton Osteotomy w/ Bone Graft Right 09/30/2014   Rt foot @ [facility]  . GASTROC RECESSION EXTREMITY Right 3//20/14   Right foot  . HERNIA REPAIR     1996  . Lapidus fusion Right 01/10/12   Rt foot  . MIDTARSAL ARTHRODESIS Right 07/17/12   Rt foot  . POSTERIOR CERVICAL FUSION/FORAMINOTOMY N/A 09/16/2015   Procedure: Cervical four - seven laminectomies with lateral mass fusion;  Surgeon: Consuella Lose, MD;  Location: [facility];  Service: Neurosurgery;  Laterality: N/A;  C4-7 laminectomy with lateral mass fusion  . Remove implant deep Right 07/17/12   Rt foot  . Remove Implant Deep Right 07/22/2014   Rt foot @ [facility]  . SHOULDER ARTHROSCOPY WITH ROTATOR CUFF REPAIR AND SUBACROMIAL DECOMPRESSION Left 07/31/2016   Procedure: LEFT SHOULDER ARTHROSCOPY WITH ROTATOR CUFF REPAIR;  Surgeon: Renette Butters, MD;  Location: [facility];  Service: Orthopedics;  Laterality: Left;  . TUBAL LIGATION       OB History   No obstetric history on file.     Family History  Problem Relation Age of Onset  . Kidney disease Mother   . Heart attack Father     Social History   Tobacco Use  . Smoking status: Former Smoker    Packs/day: 2.00    Years: 6.00    Pack years: 12.00    Types: Cigarettes    Quit date: 06/18/1978    Years since quitting: 42.1  . Smokeless tobacco: Never Used  Substance Use Topics  . Alcohol use: Yes    Alcohol/week: 0.0 standard drinks    Comment: weekend- use of beer   . Drug use: No    Home Medications Prior to Admission medications   Medication Sig Start Date End Date Taking? Authorizing Provider  capsaicin (ZOSTRIX) 0.025 % cream Apply topically 2 (two) times daily. 07/26/20  Yes Charlesetta Shanks, MD  amLODipine (NORVASC) 10 MG tablet Take 10 mg by mouth daily.  01/20/16   [provider]  atenolol (TENORMIN) 100 MG  tablet TAKE 1 TABLET BY MOUTH ONCE A DAY 08/20/19   [provider]  budesonide-formoterol (SYMBICORT) 160-4.5 MCG/ACT inhaler Inhale 2 puffs into the lungs 2 (two) times daily. 05/24/16   Parrett, Fonnie Mu, NP  cyclobenzaprine (FLEXERIL) 10 MG tablet TAKE 1 TABLET BY MOUTH EVERY 12 HOURS 07/30/19   [provider]  diazepam (VALIUM) 2 MG tablet Take 1 tablet (2 mg total) by mouth every 6 (six) hours as needed for muscle spasms. 08/18/18   Nita [place], MD  DULoxetine (CYMBALTA) 60 MG capsule Take by mouth.    [provider]  econazole nitrate 1 % cream Apply topically. 08/26/19   [provider]  gabapentin (NEURONTIN) 600 MG tablet Take by mouth.    [provider]  glucose blood (ACCU-CHEK AVIVA PLUS) test strip TEST  FOUR TIMES DAILY 07/31/19   [provider]  hydrOXYzine (ATARAX/VISTARIL) 25 MG tablet TAKE 1 TABLET BY MOUTH EVERY NIGHT AT BEDTIME 07/31/19   [provider]  mirtazapine (REMERON) 15 MG tablet Take 15 mg by mouth daily. 08/11/18   [provider]  oxyCODONE (ROXICODONE) 15 MG immediate release tablet TAKE 1 TABLET BY MOUTH EVERY 6 HOURS 07/30/19   [provider]  sodium fluoride (PREVIDENT) 1.1 % GEL dental gel USE AS DIRECTED 08/12/19   [provider]  traZODone (DESYREL) 50 MG tablet Take 50 mg by mouth at bedtime.    [provider]  TRULICITY 1.5 PT/4.6FK SOPN Inject 1.5 mg into the skin once a week. 07/15/18   [provider]  VENTOLIN HFA 108 (90 Base) MCG/ACT inhaler INHALE 1 TO 2 PUFFS INTO THE LUNGS EVERY 6 (SIX) HOURS AS NEEDED FOR WHEEZING OR SHORTNESS OF BREATH. Patient taking differently: Inhale 1-2 puffs into the lungs every 6 (six) hours as needed for wheezing or shortness of breath.  10/11/16   Rigoberto Noel, MD    Allergies    Celery (apium graveolens var. dulce) skin test, Celery oil, Pork-derived products, Latex, Other, Prednisone, and Tape  Review of  Systems   Review of Systems 10 systems reviewed and negative except as per HPI Physical Exam Updated Vital Signs BP (!) 164/97   Pulse 88   Temp 98.6 F (37 C)   Resp 20   SpO2 99%   Physical Exam Constitutional:      Comments: Patient is alert and nontoxic.  Clinically well in appearance.  She is in pain.  HENT:     Head: Normocephalic and atraumatic.  Eyes:     Extraocular Movements: Extraocular movements intact.  Cardiovascular:     Rate and Rhythm: Normal rate and regular rhythm.  Pulmonary:     Effort: Pulmonary effort is normal.     Breath sounds: Normal breath sounds.  Abdominal:     Comments: Abdomen soft without guarding.  There is no erythema of the abdominal wall.  Patient has tenderness right in the periumbilical region.  No drainage or discharge from the umbilicus.  With pressure it does feel there is some induration of the scar tissue within the umbilicus.  This is tender but not fluctuant.  No reducible mass.  Musculoskeletal:        General: Normal range of motion.  Skin:    General: Skin is warm and dry.  Neurological:     General: No focal deficit present.     Mental Status: She is oriented to person, place, and time.     Coordination: Coordination normal.  Psychiatric:        Mood and Affect: Mood normal.     ED Results / Procedures / Treatments   Labs (all labs ordered are listed, but only abnormal results are displayed) Labs Reviewed  COMPREHENSIVE METABOLIC PANEL - Abnormal; Notable for the following components:      Result Value   Glucose, Bld 201 (*)    BUN 5 (*)    All other components within normal limits  CBC - Abnormal; Notable for the following components:   RBC 5.92 (*)    Hemoglobin 15.2 (*)    HCT 49.6 (*)    MCH 25.7 (*)    All other components within normal limits  URINALYSIS, ROUTINE W REFLEX MICROSCOPIC - Abnormal; Notable for the following components:   Color, Urine AMBER (*)    APPearance CLOUDY (*)  Glucose, UA >=500  (*)    Protein, ur 100 (*)    Bacteria, UA MANY (*)    All other components within normal limits  LIPASE, BLOOD  I-STAT BETA HCG BLOOD, ED ([facility], [facility], [facility] ONLY)    EKG None  Radiology No results found.  Procedures Procedures   Medications Ordered in ED Medications  capsicum (ZOSTRIX) 0.075 % cream ( Topical Given 07/26/20 2357)  HYDROmorphone (DILAUDID) injection 1 mg (1 mg Intramuscular Given 07/26/20 2223)    ED Course  I have reviewed the triage vital signs and the nursing notes.  Pertinent labs & imaging results that were available during my care of the patient were reviewed by me and considered in my medical decision making (see chart for details).  Consult: Dr. Windle Guard general surgery   MDM Rules/Calculators/A&P                          Patient has had ongoing pain in the periumbilical area for several weeks.  CT scans were done 1/31 and 2/1 with:No overt CT findings to suggest mesenteric ischemia. No hemodynamically significant stenosis of the aortic branches.  2. Mild stranding in the umbilical region, likely related to postsurgical changes of hernia repair.   Dr. Windle Guard of general surgery and I reviewed multiple CT scans of the abdomen the patient had over the past year, the general appearance around the umbilicus was stable without any appreciable changes in the most recent scan.  My physical exam did not show any abdominal wall cellulitic changes or any abscess.  At this time I suspect minor inflammatory changes or tension within the scar tissue from very distant hernia repair.  Patient does persistently have pain but at this time there does not appear to be any indication for emergent surgical intervention. I made recommendations for another week of symptomatic pain control with topical capsaicin and abdominal binder to see if pain will resolve.  If patient has persistent pain, plan will be for follow-up with general surgery for possible exploratory evaluation of the  previous hernia repair site.  At this time, the risk benefit of surgical intervention versus continued conservative management, appears to be in favor of conservative management with follow-up. Final Clinical Impression(s) / ED Diagnoses Final diagnoses:  Periumbilical abdominal pain    Rx / DC Orders ED Discharge Orders         Ordered    capsaicin (ZOSTRIX) 0.025 % cream  2 times daily        07/26/20 2344           Charlesetta Shanks, MD 07/27/20 [phone]

## 2020-07-26 NOTE — ED Triage Notes (Signed)
Pt reports ongoing mid abd pain x 3 weeks. Has been seen for same in past and diagnosed with hernia. Reports pain increases when eating. No difficulty urinating or having bm.

## 2020-07-26 NOTE — Discharge Instructions (Addendum)
1. Wear the abdominal binder as much as possible. When you are not up and around, try to lie flat on your back. At this time, there is no suggestion on your CT scans that you have a significant hernia that contains any bowel or vital structures.It is possible to have a small amount of fat or scar tissue that is inflamed, causing pain and symptoms suggestive of an umbilical hernia. 2. You may apply capsaicin cream to your abdomen. This can be helpful for pain relief. You are already taking narcotic pain medications for pain control. Continue these as prescribed. If you tend to get any constipation, take an over-the-counter stool softener. You want to minimize any kind of straining or pushing you do with your abdominal wall. 3. After approximately 1 week of wearing the binder, applying capsaicin cream as needed and avoiding any straining of abdominal wall, schedule a follow-up recheck with [facility] if you are still significant having pain.

## 2020-07-26 NOTE — ED Notes (Signed)
General sx at bedside 

## 2020-07-27 ENCOUNTER — Other Ambulatory Visit: Payer: Medicare HMO | Admitting: Orthotics

## 2020-07-27 DIAGNOSIS — E119 Type 2 diabetes mellitus without complications: Secondary | ICD-10-CM | POA: Diagnosis not present

## 2020-07-28 DIAGNOSIS — E119 Type 2 diabetes mellitus without complications: Secondary | ICD-10-CM | POA: Diagnosis not present

## 2020-07-29 DIAGNOSIS — E119 Type 2 diabetes mellitus without complications: Secondary | ICD-10-CM | POA: Diagnosis not present

## 2020-07-30 DIAGNOSIS — E119 Type 2 diabetes mellitus without complications: Secondary | ICD-10-CM | POA: Diagnosis not present

## 2020-07-31 DIAGNOSIS — E119 Type 2 diabetes mellitus without complications: Secondary | ICD-10-CM | POA: Diagnosis not present

## 2020-08-01 DIAGNOSIS — I119 Hypertensive heart disease without heart failure: Secondary | ICD-10-CM | POA: Diagnosis not present

## 2020-08-01 DIAGNOSIS — E1165 Type 2 diabetes mellitus with hyperglycemia: Secondary | ICD-10-CM | POA: Diagnosis not present

## 2020-08-01 DIAGNOSIS — J4540 Moderate persistent asthma, uncomplicated: Secondary | ICD-10-CM | POA: Diagnosis not present

## 2020-08-01 DIAGNOSIS — E119 Type 2 diabetes mellitus without complications: Secondary | ICD-10-CM | POA: Diagnosis not present

## 2020-08-01 DIAGNOSIS — N62 Hypertrophy of breast: Secondary | ICD-10-CM | POA: Diagnosis not present

## 2020-08-01 DIAGNOSIS — Z794 Long term (current) use of insulin: Secondary | ICD-10-CM | POA: Diagnosis not present

## 2020-08-01 DIAGNOSIS — I10 Essential (primary) hypertension: Secondary | ICD-10-CM | POA: Diagnosis not present

## 2020-08-02 DIAGNOSIS — E119 Type 2 diabetes mellitus without complications: Secondary | ICD-10-CM | POA: Diagnosis not present

## 2020-08-03 DIAGNOSIS — R69 Illness, unspecified: Secondary | ICD-10-CM | POA: Diagnosis not present

## 2020-08-03 DIAGNOSIS — E119 Type 2 diabetes mellitus without complications: Secondary | ICD-10-CM | POA: Diagnosis not present

## 2020-08-04 DIAGNOSIS — R1033 Periumbilical pain: Secondary | ICD-10-CM | POA: Diagnosis not present

## 2020-08-04 DIAGNOSIS — E119 Type 2 diabetes mellitus without complications: Secondary | ICD-10-CM | POA: Diagnosis not present

## 2020-08-04 DIAGNOSIS — Z800 Family history of malignant neoplasm of digestive organs: Secondary | ICD-10-CM | POA: Diagnosis not present

## 2020-08-04 DIAGNOSIS — R1084 Generalized abdominal pain: Secondary | ICD-10-CM | POA: Diagnosis not present

## 2020-08-04 DIAGNOSIS — R634 Abnormal weight loss: Secondary | ICD-10-CM | POA: Diagnosis not present

## 2020-08-04 DIAGNOSIS — R109 Unspecified abdominal pain: Secondary | ICD-10-CM | POA: Diagnosis not present

## 2020-08-05 DIAGNOSIS — R1033 Periumbilical pain: Secondary | ICD-10-CM | POA: Diagnosis not present

## 2020-08-05 DIAGNOSIS — E119 Type 2 diabetes mellitus without complications: Secondary | ICD-10-CM | POA: Diagnosis not present

## 2020-08-05 DIAGNOSIS — R112 Nausea with vomiting, unspecified: Secondary | ICD-10-CM | POA: Diagnosis not present

## 2020-08-06 DIAGNOSIS — E119 Type 2 diabetes mellitus without complications: Secondary | ICD-10-CM | POA: Diagnosis not present

## 2020-08-07 DIAGNOSIS — E119 Type 2 diabetes mellitus without complications: Secondary | ICD-10-CM | POA: Diagnosis not present

## 2020-08-08 DIAGNOSIS — E119 Type 2 diabetes mellitus without complications: Secondary | ICD-10-CM | POA: Diagnosis not present

## 2020-08-09 DIAGNOSIS — Z79891 Long term (current) use of opiate analgesic: Secondary | ICD-10-CM | POA: Diagnosis not present

## 2020-08-09 DIAGNOSIS — E119 Type 2 diabetes mellitus without complications: Secondary | ICD-10-CM | POA: Diagnosis not present

## 2020-08-09 DIAGNOSIS — G479 Sleep disorder, unspecified: Secondary | ICD-10-CM | POA: Diagnosis not present

## 2020-08-09 DIAGNOSIS — G894 Chronic pain syndrome: Secondary | ICD-10-CM | POA: Diagnosis not present

## 2020-08-09 DIAGNOSIS — M25562 Pain in left knee: Secondary | ICD-10-CM | POA: Diagnosis not present

## 2020-08-09 DIAGNOSIS — F1120 Opioid dependence, uncomplicated: Secondary | ICD-10-CM | POA: Diagnosis not present

## 2020-08-09 DIAGNOSIS — I10 Essential (primary) hypertension: Secondary | ICD-10-CM | POA: Diagnosis not present

## 2020-08-09 DIAGNOSIS — M25561 Pain in right knee: Secondary | ICD-10-CM | POA: Diagnosis not present

## 2020-08-09 DIAGNOSIS — M25551 Pain in right hip: Secondary | ICD-10-CM | POA: Diagnosis not present

## 2020-08-09 DIAGNOSIS — G8929 Other chronic pain: Secondary | ICD-10-CM | POA: Diagnosis not present

## 2020-08-09 DIAGNOSIS — M79642 Pain in left hand: Secondary | ICD-10-CM | POA: Diagnosis not present

## 2020-08-09 DIAGNOSIS — M545 Low back pain, unspecified: Secondary | ICD-10-CM | POA: Diagnosis not present

## 2020-08-09 DIAGNOSIS — M79641 Pain in right hand: Secondary | ICD-10-CM | POA: Diagnosis not present

## 2020-08-10 ENCOUNTER — Encounter (HOSPITAL_COMMUNITY): Payer: Self-pay | Admitting: Emergency Medicine

## 2020-08-10 ENCOUNTER — Emergency Department (HOSPITAL_COMMUNITY)
Admission: EM | Admit: 2020-08-10 | Discharge: 2020-08-10 | Disposition: A | Discharge status: Home / Self Care | Payer: Medicare HMO | Attending: Emergency Medicine | Admitting: Emergency Medicine

## 2020-08-10 ENCOUNTER — Emergency Department (HOSPITAL_COMMUNITY): Payer: Medicare HMO

## 2020-08-10 DIAGNOSIS — I5032 Chronic diastolic (congestive) heart failure: Secondary | ICD-10-CM | POA: Diagnosis not present

## 2020-08-10 DIAGNOSIS — R140 Abdominal distension (gaseous): Secondary | ICD-10-CM | POA: Diagnosis not present

## 2020-08-10 DIAGNOSIS — R339 Retention of urine, unspecified: Secondary | ICD-10-CM | POA: Diagnosis not present

## 2020-08-10 DIAGNOSIS — Z79899 Other long term (current) drug therapy: Secondary | ICD-10-CM | POA: Insufficient documentation

## 2020-08-10 DIAGNOSIS — Z91040 Latex allergy status: Secondary | ICD-10-CM | POA: Diagnosis not present

## 2020-08-10 DIAGNOSIS — E119 Type 2 diabetes mellitus without complications: Secondary | ICD-10-CM | POA: Diagnosis not present

## 2020-08-10 DIAGNOSIS — J45901 Unspecified asthma with (acute) exacerbation: Secondary | ICD-10-CM | POA: Insufficient documentation

## 2020-08-10 DIAGNOSIS — E860 Dehydration: Secondary | ICD-10-CM | POA: Diagnosis not present

## 2020-08-10 DIAGNOSIS — E1142 Type 2 diabetes mellitus with diabetic polyneuropathy: Secondary | ICD-10-CM | POA: Insufficient documentation

## 2020-08-10 DIAGNOSIS — R34 Anuria and oliguria: Secondary | ICD-10-CM | POA: Diagnosis not present

## 2020-08-10 DIAGNOSIS — Z87891 Personal history of nicotine dependence: Secondary | ICD-10-CM | POA: Diagnosis not present

## 2020-08-10 DIAGNOSIS — R809 Proteinuria, unspecified: Secondary | ICD-10-CM | POA: Insufficient documentation

## 2020-08-10 DIAGNOSIS — I110 Hypertensive heart disease with heart failure: Secondary | ICD-10-CM | POA: Diagnosis not present

## 2020-08-10 LAB — BASIC METABOLIC PANEL
Anion gap: 10 (ref 5–15)
BUN: 12 mg/dL (ref 6–20)
CO2: 26 mmol/L (ref 22–32)
Calcium: 8.8 mg/dL — ABNORMAL LOW (ref 8.9–10.3)
Chloride: 96 mmol/L — ABNORMAL LOW (ref 98–111)
Creatinine, Ser: 1.64 mg/dL — ABNORMAL HIGH (ref 0.44–1.00)
GFR, Estimated: 38 mL/min — ABNORMAL LOW (ref >60)
Glucose, Bld: 279 mg/dL — ABNORMAL HIGH (ref 70–99)
Potassium: 4.5 mmol/L (ref 3.5–5.1)
Sodium: 132 mmol/L — ABNORMAL LOW (ref 135–145)

## 2020-08-10 LAB — URINALYSIS, ROUTINE W REFLEX MICROSCOPIC
Bilirubin Urine: NEGATIVE
Glucose, UA: NEGATIVE mg/dL
Hgb urine dipstick: NEGATIVE
Ketones, ur: NEGATIVE mg/dL
Leukocytes,Ua: NEGATIVE
Nitrite: NEGATIVE
Protein, ur: 30 mg/dL — AB
Specific Gravity, Urine: 1.013 (ref 1.005–1.030)
pH: 5 (ref 5.0–8.0)

## 2020-08-10 MED ORDER — SODIUM CHLORIDE 0.9 % IV BOLUS
1000.0000 mL | Freq: Once | INTRAVENOUS | Status: AC
  Administered 2020-08-10: 1000 mL via INTRAVENOUS

## 2020-08-10 NOTE — Discharge Instructions (Addendum)
Home to hydrate- recommend Gatorade Zero (sugar free). Recheck with your doctor Friday for repeat labs, monitoring your Cr.  Avoid NSAID drugs (Aleve, Motrin, Ibuprofen, Goody, Aspirin).

## 2020-08-10 NOTE — ED Provider Notes (Signed)
[facility] EMERGENCY DEPARTMENT Provider Note   CSN: [id number] Arrival date & time: 08/10/20  1022     History Chief Complaint  Patient presents with  . Urinary Retention    Diana Henderson is a 50 y.o. female.  50 year old female with complaint of difficulty voiding since yesterday. Patient reports history of prior retention, states she went to her pain management doctor yesterday to have her medications refilled but was unable to provide an adequate urine sample. Patient went home to hydrate and tried again today, sent to the ER for evaluation. Denies dysuria, reports only able to void very small amounts at a time. Reports abdominal distention, denies changes in bowel habits, nausea, vomiting or any other complaints or concerns.         Past Medical History:  Diagnosis Date  . Anxiety   . Arthritis    osteoarthritis   . Asthma    has been eval. by Dr. Gwenette Greet- last 06/2014  . Bipolar 1 disorder ([place])   . Complication of anesthesia    09/13/14: malignant HTN with inducction, case cx but no sourse identified; 11/05/14 remifentanyl infusion used to attenuate hypergynamic induction  . Depression   . Diabetes mellitus   . GERD (gastroesophageal reflux disease)   . Headache    sometimes has migraines   . Hyperlipidemia   . Hypertension   . Knee pain, chronic   . OSA on CPAP   . Pain in joint, ankle and foot 02/17/2013  . Pneumonia    hosp. for a couple of day- not sure when it was   . PTSD (post-traumatic stress disorder)   . Rotator cuff tear    bicep tendon   . Shortness of breath dyspnea   . Sleep apnea    uses CPAP intermittently, pt. doesn't remember where she had the study    Patient Active Problem List   Diagnosis Date Noted  . Diabetic polyneuropathy associated with type 2 diabetes mellitus ([place]) 08/25/2019  . Plantar fasciitis, left 08/25/2019  . Tendonitis of ankle or foot 08/25/2019  . Tight heel cords, acquired, bilateral 08/25/2019   . Dyslipidemia 10/22/2016  . Left rotator cuff tear 07/31/2016  . Diabetes mellitus, insulin dependent (IDDM), uncontrolled 09/18/2015  . AKI (acute kidney injury) ([place]) 09/18/2015  . Morbid obesity ([place]) 09/18/2015  . Asthma 09/18/2015  . Uncontrolled type 2 diabetes mellitus with complication ([place])   . Chronic diastolic CHF (congestive heart failure), NYHA class 1 ([place])   . PTSD (post-traumatic stress disorder)   . Bipolar I disorder ([place])   . Anxiety state   . Knee pain, chronic   . Cervical spondylosis with myelopathy 11/05/2014  . HTN (hypertension), malignant 09/08/2014  . HNP (herniated nucleus pulposus) with myelopathy, cervical 06/29/2014  . Deformity of metatarsal bone of right foot 06/28/2014  . CTS (carpal tunnel syndrome) 05/04/2014  . OSA (obstructive sleep apnea) 05/04/2014  . Hereditary and idiopathic peripheral neuropathy 05/04/2014  . Ganglion cyst 03/10/2014  . Vision loss, left eye 02/18/2014  . Conjunctivitis 02/17/2014  . Asthma exacerbation 01/13/2014  . Abdominal pain, unspecified site 10/28/2013  . Injury of foot, right, superficial 10/23/2013  . Tenosynovitis of foot 08/17/2013  . Pain in joint, ankle and foot 02/17/2013  . Edema of foot 02/17/2013  . Status post foot surgery 09/09/2012  . Hyperlipidemia   . 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 ANTERIOR  CERVICAL DECOMP/DISCECTOMY FUSION N/A 11/05/2014   Procedure: ANTERIOR CERVICAL DECOMPRESSION/DISCECTOMY FUSION 1 LEVEL;  Surgeon: Consuella Lose, MD;  Location: [facility];  Service: Neurosurgery;  Laterality: N/A;  Cervical five- cervical seven anterior cervical decompression with fusion interbody prosthesis plating and bonegraft  . CARPAL TUNNEL RELEASE Left 2001   Left wrist  . CERVICAL LAMINECTOMY  03/31/20176   CERVICAL 4 SEVEN LAMINECTOMIES WITH LATERAL FUSION  . Cotton Osteotomy w/ Bone  Graft Right 09/30/2014   Rt foot @ [facility]  . GASTROC RECESSION EXTREMITY Right 3//20/14   Right foot  . HERNIA REPAIR     1996  . Lapidus fusion Right 01/10/12   Rt foot  . MIDTARSAL ARTHRODESIS Right 07/17/12   Rt foot  . POSTERIOR CERVICAL FUSION/FORAMINOTOMY N/A 09/16/2015   Procedure: Cervical four - seven laminectomies with lateral mass fusion;  Surgeon: Consuella Lose, MD;  Location: [facility];  Service: Neurosurgery;  Laterality: N/A;  C4-7 laminectomy with lateral mass fusion  . Remove implant deep Right 07/17/12   Rt foot  . Remove Implant Deep Right 07/22/2014   Rt foot @ [facility]  . SHOULDER ARTHROSCOPY WITH ROTATOR CUFF REPAIR AND SUBACROMIAL DECOMPRESSION Left 07/31/2016   Procedure: LEFT SHOULDER ARTHROSCOPY WITH ROTATOR CUFF REPAIR;  Surgeon: Renette Butters, MD;  Location: [facility];  Service: Orthopedics;  Laterality: Left;  . TUBAL LIGATION       OB History   No obstetric history on file.     Family History  Problem Relation Age of Onset  . Kidney disease Mother   . Heart attack Father     Social History   Tobacco Use  . Smoking status: Former Smoker    Packs/day: 2.00    Years: 6.00    Pack years: 12.00    Types: Cigarettes    Quit date: 06/18/1978    Years since quitting: 42.1  . Smokeless tobacco: Never Used  Substance Use Topics  . Alcohol use: Yes    Alcohol/week: 0.0 standard drinks    Comment: weekend- use of beer   . Drug use: No    Home Medications Prior to Admission medications   Medication Sig Start Date End Date Taking? Authorizing Provider  amLODipine (NORVASC) 10 MG tablet Take 10 mg by mouth daily.  01/20/16   [provider]  atenolol (TENORMIN) 100 MG tablet TAKE 1 TABLET BY MOUTH ONCE A DAY 08/20/19   [provider]  budesonide-formoterol (SYMBICORT) 160-4.5 MCG/ACT inhaler Inhale 2 puffs into the lungs 2 (two) times daily. 05/24/16   Parrett, Fonnie Mu, NP  capsaicin (ZOSTRIX) 0.025 % cream Apply topically 2 (two) times daily.  07/26/20   Charlesetta Shanks, MD  cyclobenzaprine (FLEXERIL) 10 MG tablet TAKE 1 TABLET BY MOUTH EVERY 12 HOURS 07/30/19   [provider]  diazepam (VALIUM) 2 MG tablet Take 1 tablet (2 mg total) by mouth every 6 (six) hours as needed for muscle spasms. 08/18/18   Nita Sells, MD  DULoxetine (CYMBALTA) 60 MG capsule Take by mouth.    [provider]  econazole nitrate 1 % cream Apply topically. 08/26/19   [provider]  gabapentin (NEURONTIN) 600 MG tablet Take by mouth.    [provider]  glucose blood (ACCU-CHEK AVIVA PLUS) test strip TEST FOUR TIMES DAILY 07/31/19   [provider]  hydrOXYzine (ATARAX/VISTARIL) 25 MG tablet TAKE 1 TABLET BY MOUTH EVERY NIGHT AT BEDTIME 07/31/19   [provider]  mirtazapine (REMERON) 15 MG tablet Take  15 mg by mouth daily. 08/11/18   [provider]  oxyCODONE (ROXICODONE) 15 MG immediate release tablet TAKE 1 TABLET BY MOUTH EVERY 6 HOURS 07/30/19   [provider]  sodium fluoride (PREVIDENT) 1.1 % GEL dental gel USE AS DIRECTED 08/12/19   [provider]  traZODone (DESYREL) 50 MG tablet Take 50 mg by mouth at bedtime.    [provider]  TRULICITY 1.5 GU/5.4YH SOPN Inject 1.5 mg into the skin once a week. 07/15/18   [provider]  VENTOLIN HFA 108 (90 Base) MCG/ACT inhaler INHALE 1 TO 2 PUFFS INTO THE LUNGS EVERY 6 (SIX) HOURS AS NEEDED FOR WHEEZING OR SHORTNESS OF BREATH. Patient taking differently: Inhale 1-2 puffs into the lungs every 6 (six) hours as needed for wheezing or shortness of breath.  10/11/16   Rigoberto Noel, MD    Allergies    Celery (apium graveolens var. dulce) skin test, Celery oil, Pork-derived products, Latex, Other, Prednisone, and Tape  Review of Systems   Review of Systems  Constitutional: Negative for fever.  Respiratory: Negative for shortness of breath.   Cardiovascular: Negative for chest pain.  Gastrointestinal: Positive  for abdominal distention. Negative for abdominal pain, nausea and vomiting.  Genitourinary: Positive for difficulty urinating. Negative for dysuria.  Musculoskeletal: Negative for back pain.  Skin: Negative for rash and wound.  Allergic/Immunologic: Positive for immunocompromised state.  Neurological: Negative for weakness.  All other systems reviewed and are negative.   Physical Exam Updated Vital Signs BP (!) 136/101   Pulse 97   Temp 98.2 F (36.8 C) (Oral)   Resp 16   SpO2 97%   Physical Exam Vitals and nursing note reviewed.  Constitutional:      General: She is not in acute distress.    Appearance: She is well-developed and well-nourished. She is obese. She is not diaphoretic.  HENT:     Head: Normocephalic and atraumatic.  Cardiovascular:     Rate and Rhythm: Normal rate.     Heart sounds: Normal heart sounds.  Pulmonary:     Effort: Pulmonary effort is normal.     Breath sounds: Normal breath sounds.  Abdominal:     Palpations: Abdomen is soft.     Tenderness: There is no abdominal tenderness. There is no right CVA tenderness or left CVA tenderness.  Skin:    General: Skin is warm and dry.     Findings: No erythema or rash.  Neurological:     Mental Status: She is alert and oriented to person, place, and time.  Psychiatric:        Mood and Affect: Mood and affect normal.        Behavior: Behavior normal.     ED Results / Procedures / Treatments   Labs (all labs ordered are listed, but only abnormal results are displayed) Labs Reviewed  BASIC METABOLIC PANEL - Abnormal; Notable for the following components:      Result Value   Sodium 132 (*)    Chloride 96 (*)    Glucose, Bld 279 (*)    Creatinine, Ser 1.64 (*)    Calcium 8.8 (*)    GFR, Estimated 38 (*)    All other components within normal limits  URINALYSIS, ROUTINE W REFLEX MICROSCOPIC - Abnormal; Notable for the following components:   APPearance CLOUDY (*)    Protein, ur 30 (*)    Bacteria, UA  RARE (*)    All other components within normal limits  EKG None  Radiology US Renal  Result Date: 08/10/2020 CLINICAL DATA:  Decreased urine output EXAM: RENAL / URINARY TRACT ULTRASOUND COMPLETE COMPARISON:  None. FINDINGS: Right Kidney: Renal measurements: 9.9 x 4.7 x 6.4 cm = volume: 154 mL. Echogenicity within normal limits. No mass or hydronephrosis visualized. Left Kidney: Renal measurements: 10.2 x 7.1 x 5.7 cm = volume: 217 mL. Echogenicity within normal limits. No mass or hydronephrosis visualized. Bladder: Appears normal for degree of bladder distention. Other: None. IMPRESSION: No hydronephrosis. Electronically Signed   By: Dahlia Bailiff MD   On: 08/10/2020 16:40    Procedures Procedures   Medications Ordered in ED Medications  sodium chloride 0.9 % bolus 1,000 mL (1,000 mLs Intravenous New Bag/Given 08/10/20 1700)    ED Course  I have reviewed the triage vital signs and the nursing notes.  Pertinent labs & imaging results that were available during my care of the patient were reviewed by me and considered in my medical decision making (see chart for details).  Clinical Course as of 08/10/20 1758  Wed Aug 10, 4465  6924 50 year old female presents with decreased urine output despite drinking plenty of water yesterday at home. Patient reports feeling like she needs to void but only able to produce small amounts of urine.  Abdomen is soft and non tender. HR mildly elevated. Bladder scan with 70cc, will check with in/out residual.  [LM]  1543 UA without evidence of infection, does have protein. BMP with elevated Cr to 1.64 and GFR 38.  Labs from [facility] ER visit on 08/04/20 reviewed, Cr 0.92 with GFR 76.  Discussed with Dr. Zenia Resides, ER attending, recommends consult nephrology, IV fluids.  Discussed results with patient who states she use to take a fluid pill until this same thing happened several years ago and then medication was discontinued, has not had problems  since.  [LM]  0037 Discussed with Dr. Royce Macadamia with Nephrology, recommends hydrate (1L), renal US to rule otu obstruction. Advised patient to avoid all NSAIDs and recheck with PCP.  [LM]  1559 Renal US unremarkable.  Discussed results with patient, patient already avoids NSAIDs. Plan is to hydrate in the ER, home to continue to hydrate with sugar free Gatorade.  [LM]    Clinical Course User Index [LM] Roque Lias   MDM Rules/Calculators/A&P                          Final Clinical Impression(s) / ED Diagnoses Final diagnoses:  Dehydration    Rx / DC Orders ED Discharge Orders    None       Roque Lias 08/10/20 1759    Lacretia Leigh, MD 08/15/20 [phone]

## 2020-08-10 NOTE — ED Triage Notes (Addendum)
Patient here for evaluation of urinary retention, patient states last normal urination two days ago. States history of urinary retention that was resolved with urinary catheterization. Patient denies feeling urge to urinate, states she was trying to provide urine specimen at pain clinic when she was unable to urinate and so she came to ED.

## 2020-08-11 ENCOUNTER — Ambulatory Visit (INDEPENDENT_AMBULATORY_CARE_PROVIDER_SITE_OTHER): Payer: Medicare HMO | Admitting: Plastic Surgery

## 2020-08-11 ENCOUNTER — Encounter: Payer: Self-pay | Admitting: Plastic Surgery

## 2020-08-11 ENCOUNTER — Other Ambulatory Visit: Payer: Self-pay

## 2020-08-11 VITALS — BP 150/90 | HR 90 | Temp 97.7°F | Ht 65.0 in | Wt 233.0 lb

## 2020-08-11 DIAGNOSIS — E119 Type 2 diabetes mellitus without complications: Secondary | ICD-10-CM | POA: Diagnosis not present

## 2020-08-11 DIAGNOSIS — M546 Pain in thoracic spine: Secondary | ICD-10-CM | POA: Diagnosis not present

## 2020-08-11 DIAGNOSIS — N62 Hypertrophy of breast: Secondary | ICD-10-CM

## 2020-08-11 DIAGNOSIS — M545 Low back pain, unspecified: Secondary | ICD-10-CM | POA: Diagnosis not present

## 2020-08-11 DIAGNOSIS — M4004 Postural kyphosis, thoracic region: Secondary | ICD-10-CM

## 2020-08-11 DIAGNOSIS — G4733 Obstructive sleep apnea (adult) (pediatric): Secondary | ICD-10-CM | POA: Diagnosis not present

## 2020-08-11 NOTE — Progress Notes (Signed)
Referring Provider Benito Mccreedy, MD [address] [place],  [place] [address]   CC:  Chief Complaint  Patient presents with  . Advice Only      Diana Henderson is an 50 y.o. female.  HPI: Patient presents to discuss breast reduction.  She had years of back pain, neck pain and shoulder grooving related to her large breasts.  She is tried over-the-counter medications, warm packs, cold packs and supportive bras with no relief.  She has not had any previous breast procedures or biopsies.  She is currently area 50 triple D and wants to be quite a bit smaller.  She has diabetes and has had recent change in her blood sugar medication for insurance reasons.  She also has sleep apnea.  She does not smoke tobacco products.  She is up-to-date on her mammograms.  Allergies  Allergen Reactions  . Celery (Apium Graveolens Var. Dulce) Skin Test Hives  . Celery Oil Hives  . Pork-Derived Products Nausea And Vomiting and Other (See Comments)    headache  . Latex Itching    Powdered latex gloves when patient wears.    . Other Itching and Rash    PLEASE USE COBAN WRAP, IF POSSIBLE Powdered latex gloves when patient wears.    . Prednisone Rash  . Tape Itching and Rash    PLEASE USE COBAN WRAP, IF POSSIBLE    Outpatient Encounter Medications as of 08/11/2020  Medication Sig  . amLODipine (NORVASC) 10 MG tablet Take 10 mg by mouth daily.   Marland Kitchen atenolol (TENORMIN) 100 MG tablet TAKE 1 TABLET BY MOUTH ONCE A DAY  . budesonide-formoterol (SYMBICORT) 160-4.5 MCG/ACT inhaler Inhale 2 puffs into the lungs 2 (two) times daily.  . capsaicin (ZOSTRIX) 0.025 % cream Apply topically 2 (two) times daily.  . cyclobenzaprine (FLEXERIL) 10 MG tablet TAKE 1 TABLET BY MOUTH EVERY 12 HOURS  . diazepam (VALIUM) 2 MG tablet Take 1 tablet (2 mg total) by mouth every 6 (six) hours as needed for muscle spasms.  . DULoxetine (CYMBALTA) 60 MG capsule Take by mouth.  Marland Kitchen econazole nitrate 1 % cream Apply  topically.  . gabapentin (NEURONTIN) 600 MG tablet Take by mouth.  Marland Kitchen glucose blood (ACCU-CHEK AVIVA PLUS) test strip TEST FOUR TIMES DAILY (Patient not taking: Reported on 08/11/2020)  . hydrOXYzine (ATARAX/VISTARIL) 25 MG tablet TAKE 1 TABLET BY MOUTH EVERY NIGHT AT BEDTIME  . mirtazapine (REMERON) 15 MG tablet Take 15 mg by mouth daily.  Marland Kitchen oxyCODONE (ROXICODONE) 15 MG immediate release tablet TAKE 1 TABLET BY MOUTH EVERY 6 HOURS  . sodium fluoride (PREVIDENT) 1.1 % GEL dental gel USE AS DIRECTED  . traZODone (DESYREL) 50 MG tablet Take 50 mg by mouth at bedtime.  . TRULICITY 1.5 YK/9.9IP SOPN Inject 1.5 mg into the skin once a week. (Patient not taking: Reported on 08/11/2020)  . VENTOLIN HFA 108 (90 Base) MCG/ACT inhaler INHALE 1 TO 2 PUFFS INTO THE LUNGS EVERY 6 (SIX) HOURS AS NEEDED FOR WHEEZING OR SHORTNESS OF BREATH. (Patient taking differently: Inhale 1-2 puffs into the lungs every 6 (six) hours as needed for wheezing or shortness of breath. )   No facility-administered encounter medications on file as of 08/11/2020.     Past Medical History:  Diagnosis Date  . Anxiety   . Arthritis    osteoarthritis   . Asthma    has been eval. by Dr. Gwenette Greet- last 06/2014  . Bipolar 1 disorder ([place])   . Complication  of anesthesia    09/13/14: malignant HTN with inducction, case cx but no sourse identified; 11/05/14 remifentanyl infusion used to attenuate hypergynamic induction  . Depression   . Diabetes mellitus   . GERD (gastroesophageal reflux disease)   . Headache    sometimes has migraines   . Hyperlipidemia   . Hypertension   . Knee pain, chronic   . OSA on CPAP   . Pain in joint, ankle and foot 02/17/2013  . Pneumonia    hosp. for a couple of day- not sure when it was   . PTSD (post-traumatic stress disorder)   . Rotator cuff tear    bicep tendon   . Shortness of breath dyspnea   . Sleep apnea    uses CPAP intermittently, pt. doesn't remember where she had the study    Past Surgical  History:  Procedure Laterality Date  . ANTERIOR CERVICAL DECOMP/DISCECTOMY FUSION N/A 11/05/2014   Procedure: ANTERIOR CERVICAL DECOMPRESSION/DISCECTOMY FUSION 1 LEVEL;  Surgeon: Consuella Lose, MD;  Location: [facility];  Service: Neurosurgery;  Laterality: N/A;  Cervical five- cervical seven anterior cervical decompression with fusion interbody prosthesis plating and bonegraft  . CARPAL TUNNEL RELEASE Left 2001   Left wrist  . CERVICAL LAMINECTOMY  03/31/20176   CERVICAL 4 SEVEN LAMINECTOMIES WITH LATERAL FUSION  . Cotton Osteotomy w/ Bone Graft Right 09/30/2014   Rt foot @ [facility]  . GASTROC RECESSION EXTREMITY Right 3//20/14   Right foot  . HERNIA REPAIR     1996  . Lapidus fusion Right 01/10/12   Rt foot  . MIDTARSAL ARTHRODESIS Right 07/17/12   Rt foot  . POSTERIOR CERVICAL FUSION/FORAMINOTOMY N/A 09/16/2015   Procedure: Cervical four - seven laminectomies with lateral mass fusion;  Surgeon: Consuella Lose, MD;  Location: [facility];  Service: Neurosurgery;  Laterality: N/A;  C4-7 laminectomy with lateral mass fusion  . Remove implant deep Right 07/17/12   Rt foot  . Remove Implant Deep Right 07/22/2014   Rt foot @ [facility]  . SHOULDER ARTHROSCOPY WITH ROTATOR CUFF REPAIR AND SUBACROMIAL DECOMPRESSION Left 07/31/2016   Procedure: LEFT SHOULDER ARTHROSCOPY WITH ROTATOR CUFF REPAIR;  Surgeon: Renette Butters, MD;  Location: [facility];  Service: Orthopedics;  Laterality: Left;  . TUBAL LIGATION      Family History  Problem Relation Age of Onset  . Kidney disease Mother   . Heart attack Father     Social History   Social History Narrative   Lives with daughter, works at [employer], does not exercise but is on her feet at work.     Review of Systems General: Denies fevers, chills, weight loss CV: Denies chest pain, shortness of breath, palpitations  Physical Exam Vitals with BMI 08/11/2020 08/10/2020 08/10/2020  Height 5\' 5"  - -  Weight 233 lbs - -  BMI 41.32 - -  Systolic 440 102  725  Diastolic 90 85 90  Pulse 90 101 104  Some encounter information is confidential and restricted. Go to Review Flowsheets activity to see all data.    General:  No acute distress,  Alert and oriented, Non-Toxic, Normal speech and affect Breast: She has grade 3 ptosis.  Sternal notch to nipple is 39 cm on the right and 40 cm on the left.  Nipple to fold is 18 cm on the right and 20 cm on the left.  I do not see any obvious scars or masses.  Assessment/Plan I do believe the patient would benefit from a breast  reduction.  Unfortunately it looks like her blood sugar control is not very good.  The last hemoglobin A1c measurement I have he is 13.  Although that is from a year ago recent blood glucose measurements from emergency room's that I have access to are regularly over 200.  I explained that if she were to have a breast reduction with uncontrolled blood sugar she is at a much higher risk for complications.  Additionally I think the technique of choice for her would be a free nipple graft given the length of her pedicle and comorbidities.  Her BMI is 47 which also puts her in a high risk category.  She is can work on getting her blood sugar control improved and we will try to get a more recent hemoglobin A1c measurement.  We will plan to see her again in 3 to 4 months.  Diana Henderson 08/11/2020, 3:17 PM

## 2020-08-12 DIAGNOSIS — M79642 Pain in left hand: Secondary | ICD-10-CM | POA: Diagnosis not present

## 2020-08-12 DIAGNOSIS — G894 Chronic pain syndrome: Secondary | ICD-10-CM | POA: Diagnosis not present

## 2020-08-12 DIAGNOSIS — Z79891 Long term (current) use of opiate analgesic: Secondary | ICD-10-CM | POA: Diagnosis not present

## 2020-08-12 DIAGNOSIS — G479 Sleep disorder, unspecified: Secondary | ICD-10-CM | POA: Diagnosis not present

## 2020-08-12 DIAGNOSIS — M545 Low back pain, unspecified: Secondary | ICD-10-CM | POA: Diagnosis not present

## 2020-08-12 DIAGNOSIS — M79641 Pain in right hand: Secondary | ICD-10-CM | POA: Diagnosis not present

## 2020-08-12 DIAGNOSIS — G8929 Other chronic pain: Secondary | ICD-10-CM | POA: Diagnosis not present

## 2020-08-12 DIAGNOSIS — F1120 Opioid dependence, uncomplicated: Secondary | ICD-10-CM | POA: Diagnosis not present

## 2020-08-12 DIAGNOSIS — M25551 Pain in right hip: Secondary | ICD-10-CM | POA: Diagnosis not present

## 2020-08-12 DIAGNOSIS — M25562 Pain in left knee: Secondary | ICD-10-CM | POA: Diagnosis not present

## 2020-08-12 DIAGNOSIS — I10 Essential (primary) hypertension: Secondary | ICD-10-CM | POA: Diagnosis not present

## 2020-08-12 DIAGNOSIS — E119 Type 2 diabetes mellitus without complications: Secondary | ICD-10-CM | POA: Diagnosis not present

## 2020-08-12 DIAGNOSIS — M25561 Pain in right knee: Secondary | ICD-10-CM | POA: Diagnosis not present

## 2020-08-12 DIAGNOSIS — M25512 Pain in left shoulder: Secondary | ICD-10-CM | POA: Diagnosis not present

## 2020-08-13 DIAGNOSIS — E119 Type 2 diabetes mellitus without complications: Secondary | ICD-10-CM | POA: Diagnosis not present

## 2020-08-14 DIAGNOSIS — E119 Type 2 diabetes mellitus without complications: Secondary | ICD-10-CM | POA: Diagnosis not present

## 2020-08-15 DIAGNOSIS — E119 Type 2 diabetes mellitus without complications: Secondary | ICD-10-CM | POA: Diagnosis not present

## 2020-08-16 DIAGNOSIS — I10 Essential (primary) hypertension: Secondary | ICD-10-CM | POA: Diagnosis not present

## 2020-08-16 DIAGNOSIS — J45998 Other asthma: Secondary | ICD-10-CM | POA: Diagnosis not present

## 2020-08-16 DIAGNOSIS — E119 Type 2 diabetes mellitus without complications: Secondary | ICD-10-CM | POA: Diagnosis not present

## 2020-08-16 DIAGNOSIS — R1084 Generalized abdominal pain: Secondary | ICD-10-CM | POA: Diagnosis not present

## 2020-08-17 ENCOUNTER — Ambulatory Visit: Payer: Self-pay | Admitting: Surgery

## 2020-08-17 DIAGNOSIS — R1084 Generalized abdominal pain: Secondary | ICD-10-CM | POA: Diagnosis not present

## 2020-08-17 DIAGNOSIS — E119 Type 2 diabetes mellitus without complications: Secondary | ICD-10-CM | POA: Diagnosis not present

## 2020-08-17 NOTE — H&P (View-Only) (Signed)
Diana Henderson DOB: 05/12/1971 Single / Language: [place] / Race: Black or African American Female  History of Present Illness Patient words: Follows up after recent evaluation emergency room for umbilical pain. She has continued to lose weight first experienced severe pain in this area.   07/26/20 Consult note: 50 year old woman with history of umbilical hernia repair in 1996 (unknown if mesh was used) who presents to [facility] emergency room with persistent complaint of periumbilical pain. This is her third emergency room visit in the last 9 days for the same (initially went to [place], then [facility] in [place], and now here). She also saw her primary care doctor for this. She has had 2 CT scans, one on February 2 and one on 1/31 (the latter which is viewable in pacs) which have been negative for hernia or other pathology to explain her symptoms aside from some induration or strain noted in the periumbilical region. She has been having pain for about 3 weeks and has noted swelling to the right of her midline incision with tenderness. The pain is worse after eating and also with any palpation or movement. She endorses some weight loss associated with this.  Imaging: [facility] CT from 07/20/2020: "Mild stranding in the umbilical region, likely related to postsurgical changes of hernia repair. " (Images not available for review)  [facility] CT from 07/18/20: "IMPRESSION: 1. Mild induration of the periumbilical soft tissues. No hernia or fluid collection. 2. No bowel obstruction. Normal appendix. 3. Small uterine fibroids. 4. 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 No cortical collapse.   Electronically Signed By: Anner Crete M.D. On: 07/18/2020 17:04"  I have reviewed the images personally.   A/P: Unclear what the source of her abdominal pain and umbilical tenderness is, but based on recent imaging studies there is no indication for emergent  surgical intervention. I have also reviewed previous abdominal CTs over the course of the last year that she has received for various abdominal pain complaints and to my review the stranding or induration of the soft tissues in the umbilical region has been stable over this period of time and is not new. Differential diagnosis includes occult regulate of preperitoneal fat/fat necrosis, occult hernia or infarcting omentum, muscle spasm, lipoma or subcutaneous mass although this seems less likely based on CT. Clinically there is no evidence of infection. If admitted, general surgery service will follow.    Past Surgical History  Foot Surgery Bilateral. Shoulder Surgery Left.  Diagnostic Studies History Mammogram 1-3 years ago  Allergies Penicillins Allergies Reconciled  Medication History  Pregabalin (25MG  Capsule, Oral) Active. amLODIPine Besylate (10MG  Tablet, Oral) Active. Atenolol-Chlorthalidone (100-25MG  Tablet, Oral) Active. Cyclobenzaprine HCl (10MG  Tablet, Oral) Active. Chlorhexidine Gluconate (0.12% Solution, Mouth/Throat) Active. Dicyclomine HCl (10MG  Capsule, Oral) Active. DULoxetine HCl (30MG  Capsule DR Part, Oral) Active. Famotidine (20MG  Tablet, Oral) Active. glipiZIDE ER (10MG  Tablet ER 24HR, Oral) Active. Ketorolac Tromethamine (10MG  Tablet, Oral) Active. methylPREDNISolone (4MG  Tab Ther Pack, Oral) Active. metFORMIN HCl (1000MG  Tablet, Oral) Active. Mirtazapine (15MG  Tablet, Oral) Active. Victoza (18MG /3ML Soln Pen-inj, Subcutaneous) Active. Levemir FlexTouch (100UNIT/ML Soln Pen-inj, Subcutaneous) Active. Diclofenac Sodium (75MG  Tablet DR, Oral) Active. Medications Reconciled  Social History Caffeine use Coffee.  Family History  Alcohol Abuse Father. Arthritis Brother, Daughter, Mother, Sister. Diabetes Mellitus Brother. Hypertension Brother, Father, Mother, Sister. Kidney Disease Mother. Seizure disorder Mother.  Pregnancy  / Birth History  Age at menarche 24 years. Gravida 1 Maternal age 42-30 Para 1 Regular periods  Other Problems  Anxiety Disorder Arthritis Asthma Back Pain Depression Diabetes Mellitus Gastric Ulcer Gastroesophageal Reflux Disease High blood pressure Hypercholesterolemia Sleep Apnea Umbilical Hernia Repair     Review of Systems General Present- Appetite Loss and Weight Loss. Not Present- Chills, Fatigue, Fever, Night Sweats and Weight Gain. Skin Not Present- Change in Wart/Mole, Dryness, Hives, Jaundice, New Lesions, Non-Healing Wounds, Rash and Ulcer. HEENT Present- Wears glasses/contact lenses. Not Present- Earache, Hearing Loss, Hoarseness, Nose Bleed, Oral Ulcers, Ringing in the Ears, Seasonal Allergies, Sinus Pain, Sore Throat, Visual Disturbances and Yellow Eyes. Respiratory Not Present- Bloody sputum, Chronic Cough, Difficulty Breathing, Snoring and Wheezing. Breast Not Present- Breast Mass, Breast Pain, Nipple Discharge and Skin Changes. Cardiovascular Not Present- Chest Pain, Difficulty Breathing Lying Down, Leg Cramps, Palpitations, Rapid Heart Rate, Shortness of Breath and Swelling of Extremities. Gastrointestinal Present- Abdominal Pain. Not Present- Bloating, Bloody Stool, Change in Bowel Habits, Chronic diarrhea, Constipation, Difficulty Swallowing, Excessive gas, Gets full quickly at meals, Hemorrhoids, Indigestion, Nausea, Rectal Pain and Vomiting. Female Genitourinary Not Present- Frequency, Nocturia, Painful Urination, Pelvic Pain and Urgency. Musculoskeletal Present- Back Pain, Joint Pain and Swelling of Extremities. Not Present- Joint Stiffness, Muscle Pain and Muscle Weakness. Neurological Present- Decreased Memory, Numbness, Tingling, Trouble walking and Weakness. Not Present- Fainting, Headaches, Seizures and Tremor. Psychiatric Present- Anxiety, Change in Sleep Pattern, Depression and Frequent crying. Not Present- Bipolar and  Fearful. Endocrine Not Present- Cold Intolerance, Excessive Hunger, Hair Changes, Heat Intolerance, Hot flashes and New Diabetes. Hematology Not Present- Blood Thinners, Easy Bruising, Excessive bleeding, Gland problems, HIV and Persistent Infections. All other systems negative  Vitals Weight: 239.38 lb Height: 70in Body Surface Area: 2.25 m Body Mass Index: 34.35 kg/m  Temp.: 90F  Pulse: 129 (Regular)  P.OX: 86% (Room air) BP: 142/82(Sitting, Left Arm, Standard)   Physical Exam  Alert, well appearing, no distress Abdomen soft, nondistended, focally tender just to the right superior to the umbilicus where there is a vaguely palpable fullness or subcutaneous mass   Assessment & Plan   ABDOMINAL PAIN (R10.9) Story: She has been to multiple emergency rooms and has been evaluated by numerous providers/ CT scans. None of these demonstrated a recurrent hernia or any intra-abdominal pathology. There is nothing notable the subcutaneous tissues on her scans but there is a palpable fullness where she has the most pain. I discussed with her that given all of this, I cannot guarantee that surgical intervention will alleviate her pain. However, I discussed that we could perform a diagnostic laparoscopy to ensure no intra-abdominal pathology/adhesions or occult hernia. followed by local exploration of the abdominal wall in the area of the umbilicus where there is palpable fullness. We discussed risks of bleeding, infection, pain, scarring, injury to intra-abdominal structures, failure to resolve symptoms, and worsening of her pain. Questions were welcomed and answered. She strongly wishes to proceed.

## 2020-08-17 NOTE — H&P (Signed)
Diana Henderson DOB: [birth year] Single / Language: [place] / Race: Black or African American Female  History of Present Illness Patient words: Follows up after recent evaluation emergency room for umbilical pain. She has continued to lose weight first experienced severe pain in this area.   07/26/20 Consult note: 50 year old woman with history of umbilical hernia repair in 1996 (unknown if mesh was used) who presents to [facility] emergency room with persistent complaint of periumbilical pain. This is her third emergency room visit in the last 9 days for the same (initially went to [place], then [facility] in [place], and now here). She also saw her primary care doctor for this. She has had 2 CT scans, one on February 2 and one on 1/31 (the latter which is viewable in pacs) which have been negative for hernia or other pathology to explain her symptoms aside from some induration or strain noted in the periumbilical region. She has been having pain for about 3 weeks and has noted swelling to the right of her midline incision with tenderness. The pain is worse after eating and also with any palpation or movement. She endorses some weight loss associated with this.  Imaging: [facility] CT from 07/20/2020: "Mild stranding in the umbilical region, likely related to postsurgical changes of hernia repair. " (Images not available for review)  [facility] CT from 07/18/20: "IMPRESSION: 1. Mild induration of the periumbilical soft tissues. No hernia or fluid collection. 2. No bowel obstruction. Normal appendix. 3. Small uterine fibroids. 4. 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 No cortical collapse.   Electronically Signed By: Anner Crete M.D. On: 07/18/2020 17:04"  I have reviewed the images personally.   A/P: Unclear what the source of her abdominal pain and umbilical tenderness is, but based on recent imaging studies there is no indication for emergent  surgical intervention. I have also reviewed previous abdominal CTs over the course of the last year that she has received for various abdominal pain complaints and to my review the stranding or induration of the soft tissues in the umbilical region has been stable over this period of time and is not new. Differential diagnosis includes occult regulate of preperitoneal fat/fat necrosis, occult hernia or infarcting omentum, muscle spasm, lipoma or subcutaneous mass although this seems less likely based on CT. Clinically there is no evidence of infection. If admitted, general surgery service will follow.    Past Surgical History  Foot Surgery Bilateral. Shoulder Surgery Left.  Diagnostic Studies History Mammogram 1-3 years ago  Allergies Penicillins Allergies Reconciled  Medication History  Pregabalin (25MG  Capsule, Oral) Active. amLODIPine Besylate (10MG  Tablet, Oral) Active. Atenolol-Chlorthalidone (100-25MG  Tablet, Oral) Active. Cyclobenzaprine HCl (10MG  Tablet, Oral) Active. Chlorhexidine Gluconate (0.12% Solution, Mouth/Throat) Active. Dicyclomine HCl (10MG  Capsule, Oral) Active. DULoxetine HCl (30MG  Capsule DR Part, Oral) Active. Famotidine (20MG  Tablet, Oral) Active. glipiZIDE ER (10MG  Tablet ER 24HR, Oral) Active. Ketorolac Tromethamine (10MG  Tablet, Oral) Active. methylPREDNISolone (4MG  Tab Ther Pack, Oral) Active. metFORMIN HCl (1000MG  Tablet, Oral) Active. Mirtazapine (15MG  Tablet, Oral) Active. Victoza (18MG /3ML Soln Pen-inj, Subcutaneous) Active. Levemir FlexTouch (100UNIT/ML Soln Pen-inj, Subcutaneous) Active. Diclofenac Sodium (75MG  Tablet DR, Oral) Active. Medications Reconciled  Social History Caffeine use Coffee.  Family History  Alcohol Abuse Father. Arthritis Brother, Daughter, Mother, Sister. Diabetes Mellitus Brother. Hypertension Brother, Father, Mother, Sister. Kidney Disease Mother. Seizure disorder Mother.  Pregnancy  / Birth History  Age at menarche 45 years. Gravida 1 Maternal age 35-30 Para 1 Regular periods  Other Problems  Anxiety Disorder Arthritis Asthma Back Pain Depression Diabetes Mellitus Gastric Ulcer Gastroesophageal Reflux Disease High blood pressure Hypercholesterolemia Sleep Apnea Umbilical Hernia Repair     Review of Systems General Present- Appetite Loss and Weight Loss. Not Present- Chills, Fatigue, Fever, Night Sweats and Weight Gain. Skin Not Present- Change in Wart/Mole, Dryness, Hives, Jaundice, New Lesions, Non-Healing Wounds, Rash and Ulcer. HEENT Present- Wears glasses/contact lenses. Not Present- Earache, Hearing Loss, Hoarseness, Nose Bleed, Oral Ulcers, Ringing in the Ears, Seasonal Allergies, Sinus Pain, Sore Throat, Visual Disturbances and Yellow Eyes. Respiratory Not Present- Bloody sputum, Chronic Cough, Difficulty Breathing, Snoring and Wheezing. Breast Not Present- Breast Mass, Breast Pain, Nipple Discharge and Skin Changes. Cardiovascular Not Present- Chest Pain, Difficulty Breathing Lying Down, Leg Cramps, Palpitations, Rapid Heart Rate, Shortness of Breath and Swelling of Extremities. Gastrointestinal Present- Abdominal Pain. Not Present- Bloating, Bloody Stool, Change in Bowel Habits, Chronic diarrhea, Constipation, Difficulty Swallowing, Excessive gas, Gets full quickly at meals, Hemorrhoids, Indigestion, Nausea, Rectal Pain and Vomiting. Female Genitourinary Not Present- Frequency, Nocturia, Painful Urination, Pelvic Pain and Urgency. Musculoskeletal Present- Back Pain, Joint Pain and Swelling of Extremities. Not Present- Joint Stiffness, Muscle Pain and Muscle Weakness. Neurological Present- Decreased Memory, Numbness, Tingling, Trouble walking and Weakness. Not Present- Fainting, Headaches, Seizures and Tremor. Psychiatric Present- Anxiety, Change in Sleep Pattern, Depression and Frequent crying. Not Present- Bipolar and  Fearful. Endocrine Not Present- Cold Intolerance, Excessive Hunger, Hair Changes, Heat Intolerance, Hot flashes and New Diabetes. Hematology Not Present- Blood Thinners, Easy Bruising, Excessive bleeding, Gland problems, HIV and Persistent Infections. All other systems negative  Vitals Weight: 239.38 lb Height: 70in Body Surface Area: 2.25 m Body Mass Index: 34.35 kg/m  Temp.: 70F  Pulse: 129 (Regular)  P.OX: 86% (Room air) BP: 142/82(Sitting, Left Arm, Standard)   Physical Exam  Alert, well appearing, no distress Abdomen soft, nondistended, focally tender just to the right superior to the umbilicus where there is a vaguely palpable fullness or subcutaneous mass   Assessment & Plan   ABDOMINAL PAIN (R10.9) Story: She has been to multiple emergency rooms and has been evaluated by numerous providers/ CT scans. None of these demonstrated a recurrent hernia or any intra-abdominal pathology. There is nothing notable the subcutaneous tissues on her scans but there is a palpable fullness where she has the most pain. I discussed with her that given all of this, I cannot guarantee that surgical intervention will alleviate her pain. However, I discussed that we could perform a diagnostic laparoscopy to ensure no intra-abdominal pathology/adhesions or occult hernia. followed by local exploration of the abdominal wall in the area of the umbilicus where there is palpable fullness. We discussed risks of bleeding, infection, pain, scarring, injury to intra-abdominal structures, failure to resolve symptoms, and worsening of her pain. Questions were welcomed and answered. She strongly wishes to proceed.

## 2020-08-18 DIAGNOSIS — E119 Type 2 diabetes mellitus without complications: Secondary | ICD-10-CM | POA: Diagnosis not present

## 2020-08-19 ENCOUNTER — Other Ambulatory Visit (HOSPITAL_COMMUNITY)
Admission: RE | Admit: 2020-08-19 | Discharge: 2020-08-19 | Disposition: A | Discharge status: Home / Self Care | Payer: Medicare HMO | Source: Ambulatory Visit | Attending: Surgery | Admitting: Surgery

## 2020-08-19 DIAGNOSIS — Z20822 Contact with and (suspected) exposure to covid-19: Secondary | ICD-10-CM | POA: Insufficient documentation

## 2020-08-19 DIAGNOSIS — E119 Type 2 diabetes mellitus without complications: Secondary | ICD-10-CM | POA: Diagnosis not present

## 2020-08-19 DIAGNOSIS — Z01812 Encounter for preprocedural laboratory examination: Secondary | ICD-10-CM | POA: Diagnosis not present

## 2020-08-19 LAB — SARS CORONAVIRUS 2 (TAT 6-24 HRS): SARS Coronavirus 2: NEGATIVE

## 2020-08-19 NOTE — Patient Instructions (Addendum)
DUE TO COVID-19 ONLY ONE VISITOR IS ALLOWED TO COME WITH YOU AND STAY IN THE WAITING ROOM ONLY DURING PRE OP AND PROCEDURE DAY OF SURGERY. THE 1 VISITOR  MAY VISIT WITH YOU AFTER SURGERY IN YOUR PRIVATE ROOM DURING VISITING HOURS ONLY!  . ONCE YOUR COVID TEST IS COMPLETED,  PLEASE BEGIN THE QUARANTINE INSTRUCTIONS AS OUTLINED IN YOUR HANDOUT.                Ruchama Chesler    Your procedure is scheduled on: 08/23/20   Report to [facility] Main  Entrance   Report to admitting at   8:00 AM     Call this number if you have problems the morning of surgery [phone]    Remember: Do not eat food or drink liquids :After Midnight.   BRUSH YOUR TEETH MORNING OF SURGERY AND RINSE YOUR MOUTH OUT, NO CHEWING GUM CANDY OR MINTS.     Take these medicines the morning of surgery with A SIP OF WATER: Roxicodone if needed, Cymbalta, Mirtazapine, Atenolol,   Amlodipine,use inhaler and bring with you. Bring your mask and tubing to the hospital  How to Manage Your Diabetes Before and After Surgery  Why is it important to control my blood sugar before and after surgery? . Improving blood sugar levels before and after surgery helps healing and can limit problems. . A way of improving blood sugar control is eating a healthy diet by: o  Eating less sugar and carbohydrates o  Increasing activity/exercise o  Talking with your doctor about reaching your blood sugar goals . High blood sugars (greater than 180 mg/dL) can raise your risk of infections and slow your recovery, so you will need to focus on controlling your diabetes during the weeks before surgery. . Make sure that the doctor who takes care of your diabetes knows about your planned surgery including the date and location.  How do I manage my blood sugar before surgery? . Check your blood sugar at least 4 times a day, starting 2 days before surgery, to make sure that the level is not too high or low. o Check your blood sugar the  morning of your surgery when you wake up and every 2 hours until you get to the Short Stay unit. . If your blood sugar is less than 70 mg/dL, you will need to treat for low blood sugar: o Do not take insulin. o Treat a low blood sugar (less than 70 mg/dL) with  cup of clear juice (cranberry or apple), 4 glucose tablets, OR glucose gel. o Recheck blood sugar in 15 minutes after treatment (to make sure it is greater than 70 mg/dL). If your blood sugar is not greater than 70 mg/dL on recheck, call [phone] for further instructions. . Report your blood sugar to the short stay nurse when you get to Short Stay.  . If you are admitted to the hospital after surgery: o Your blood sugar will be checked by the staff and you will probably be given insulin after surgery (instead of oral diabetes medicines) to make sure you have good blood sugar levels. o The goal for blood sugar control after surgery is 80-180 mg/dL.   WHAT DO I DO ABOUT MY DIABETES MEDICATION?  Marland Kitchen Do not take oral diabetes medicines (pills) the morning of surgery.  . Take 10 units of lantus in am( 50% of regular dose)  You may not have any metal on your body including hair pins and              piercings  Do not wear jewelry, make-up, lotions, powders or perfumes, deodorant             Do not wear nail polish on your fingernails.  Do not shave  48 hours prior to surgery.         Do not bring valuables to the hospital. [place].  Contacts, dentures or bridgework may not be worn into surgery.       Patients discharged the day of surgery will not be allowed to drive home.   IF YOU ARE HAVING SURGERY AND GOING HOME THE SAME DAY, YOU MUST HAVE AN ADULT TO DRIVE YOU HOME AND BE WITH YOU FOR 24 HOURS. YOU MAY GO HOME BY TAXI OR UBER OR ORTHERWISE, BUT AN ADULT MUST ACCOMPANY YOU HOME AND STAY WITH YOU FOR 24 HOURS.  Name and phone number of your  driver:  Special Instructions: N/A              Please read over the following fact sheets you were given: _____________________________________________________________________             [facility] - Preparing for Surgery Before surgery, you can play an important role.  Because skin is not sterile, your skin needs to be as free of germs as possible.  You can reduce the number of germs on your skin by washing with CHG (chlorahexidine gluconate) soap before surgery.  CHG is an antiseptic cleaner which kills germs and bonds with the skin to continue killing germs even after washing. Please DO NOT use if you have an allergy to CHG or antibacterial soaps.  If your skin becomes reddened/irritated stop using the CHG and inform your nurse when you arrive at Short Stay. Do not shave (including legs and underarms) for at least 48 hours prior to the first CHG shower Please follow these instructions carefully:  1.  Shower with CHG Soap the night before surgery and the  morning of Surgery.  2.  If you choose to wash your hair, wash your hair first as usual with your  normal  shampoo.  3.  After you shampoo, rinse your hair and body thoroughly to remove the  shampoo.                                        4.  Use CHG as you would any other liquid soap.  You can apply chg directly  to the skin and wash                       Gently with a scrungie or clean washcloth.  5.  Apply the CHG Soap to your body ONLY FROM THE NECK DOWN.   Do not use on face/ open                           Wound or open sores. Avoid contact with eyes, ears mouth and genitals (private parts).                       Wash face,  Genitals (private parts) with your normal soap.             6.  Wash thoroughly, paying special attention to the area where your surgery  will be performed.  7.  Thoroughly rinse your body with warm water from the neck down.  8.  DO NOT shower/wash with your normal soap after using and rinsing off  the CHG  Soap.             9.  Pat yourself dry with a clean towel.            10.  Wear clean pajamas.            11.  Place clean sheets on your bed the night of your first shower and do not  sleep with pets. Day of Surgery : Do not apply any lotions/deodorants the morning of surgery.  Please wear clean clothes to the hospital/surgery center.  FAILURE TO FOLLOW THESE INSTRUCTIONS MAY RESULT IN THE CANCELLATION OF YOUR SURGERY PATIENT SIGNATURE_________________________________  NURSE SIGNATURE__________________________________  ________________________________________________________________________

## 2020-08-20 DIAGNOSIS — E119 Type 2 diabetes mellitus without complications: Secondary | ICD-10-CM | POA: Diagnosis not present

## 2020-08-21 DIAGNOSIS — E119 Type 2 diabetes mellitus without complications: Secondary | ICD-10-CM | POA: Diagnosis not present

## 2020-08-22 ENCOUNTER — Encounter (HOSPITAL_COMMUNITY): Payer: Self-pay | Admitting: Surgery

## 2020-08-22 ENCOUNTER — Encounter (HOSPITAL_COMMUNITY): Payer: Self-pay

## 2020-08-22 ENCOUNTER — Other Ambulatory Visit: Payer: Self-pay

## 2020-08-22 ENCOUNTER — Encounter (HOSPITAL_COMMUNITY)
Admission: RE | Admit: 2020-08-22 | Discharge: 2020-08-22 | Disposition: A | Discharge status: Home / Self Care | Payer: Medicare HMO | Source: Ambulatory Visit | Attending: Surgery | Admitting: Surgery

## 2020-08-22 DIAGNOSIS — Z01812 Encounter for preprocedural laboratory examination: Secondary | ICD-10-CM | POA: Diagnosis not present

## 2020-08-22 DIAGNOSIS — R109 Unspecified abdominal pain: Secondary | ICD-10-CM | POA: Diagnosis present

## 2020-08-22 DIAGNOSIS — Z880 Allergy status to penicillin: Secondary | ICD-10-CM | POA: Diagnosis not present

## 2020-08-22 DIAGNOSIS — E119 Type 2 diabetes mellitus without complications: Secondary | ICD-10-CM | POA: Insufficient documentation

## 2020-08-22 DIAGNOSIS — M795 Residual foreign body in soft tissue: Secondary | ICD-10-CM | POA: Diagnosis not present

## 2020-08-22 DIAGNOSIS — Z87891 Personal history of nicotine dependence: Secondary | ICD-10-CM | POA: Diagnosis not present

## 2020-08-22 DIAGNOSIS — Z9889 Other specified postprocedural states: Secondary | ICD-10-CM | POA: Diagnosis not present

## 2020-08-22 DIAGNOSIS — K660 Peritoneal adhesions (postprocedural) (postinfection): Secondary | ICD-10-CM | POA: Diagnosis not present

## 2020-08-22 LAB — GLUCOSE, CAPILLARY: Glucose-Capillary: 178 mg/dL — ABNORMAL HIGH (ref 70–99)

## 2020-08-22 LAB — BASIC METABOLIC PANEL
Anion gap: 9 (ref 5–15)
BUN: 7 mg/dL (ref 6–20)
CO2: 25 mmol/L (ref 22–32)
Calcium: 9.1 mg/dL (ref 8.9–10.3)
Chloride: 102 mmol/L (ref 98–111)
Creatinine, Ser: 0.76 mg/dL (ref 0.44–1.00)
GFR, Estimated: >60 mL/min (ref >60)
Glucose, Bld: 184 mg/dL — ABNORMAL HIGH (ref 70–99)
Potassium: 3.6 mmol/L (ref 3.5–5.1)
Sodium: 136 mmol/L (ref 135–145)

## 2020-08-22 LAB — CBC
HCT: 45.8 % (ref 36.0–46.0)
Hemoglobin: 14.5 g/dL (ref 12.0–15.0)
MCH: 26 pg (ref 26.0–34.0)
MCHC: 31.7 g/dL (ref 30.0–36.0)
MCV: 82.1 fL (ref 80.0–100.0)
Platelets: 201 10*3/uL (ref 150–400)
RBC: 5.58 MIL/uL — ABNORMAL HIGH (ref 3.87–5.11)
RDW: 13.2 % (ref 11.5–15.5)
WBC: 5.3 10*3/uL (ref 4.0–10.5)
nRBC: 0 % (ref 0.0–0.2)

## 2020-08-22 LAB — HEMOGLOBIN A1C
Hgb A1c MFr Bld: 9.9 % — ABNORMAL HIGH (ref 4.8–5.6)
Mean Plasma Glucose: 237.43 mg/dL

## 2020-08-22 MED ORDER — BUPIVACAINE LIPOSOME 1.3 % IJ SUSP
20.0000 mL | Freq: Once | INTRAMUSCULAR | Status: DC
  Filled 2020-08-22: qty 20

## 2020-08-22 NOTE — Progress Notes (Signed)
Anesthesia Chart Review   Case: [id number] Date/Time: 08/23/20 0945   Procedure: LAPAROSCOPY DIAGNOSTIC, EXPLORATION OF ABDOMINAL WALL (N/A ) - 90   Anesthesia type: General   Pre-op diagnosis: abdominal pain   Location: [facility] ROOM 02 / [facility]   Surgeons: Clovis Riley, MD      DISCUSSION:50 y.o. former smoker with h/o DM II, HTN, asthma, GERD, sleep apnea, C5-6 ACDF 11/05/14, abdominal pain scheduled for above procedure 08/23/2020 with Dr. Romana Juniper.   Pt with history of pseudocholinesterase deficiency per previous anesthesia note.   Pt reported 10/10 abdominal pain while at PAT visit.  Advised to contact her surgeon, she is scheduled for diagnostic laparoscopy tomorrow.  Pt with history of white coat syndrome per previous cardiology note, evaluate BP DOS.   Evaluated by cardiology in the past. Cardiac cath 09/28/2016 with normal LV function, normal coronary arteries. Advised to follow up with cardio prn.  VS: Pulse (!) 115   Temp 37.1 C (Oral)   Resp 20   Ht 5\' 5"  (1.651 m)   Wt 108.9 kg   SpO2 97%   BMI 39.94 kg/m   PROVIDERS: Benito Mccreedy, MD is PCP    LABS: Labs reviewed: Acceptable for surgery. (all labs ordered are listed, but only abnormal results are displayed)  Labs Reviewed  GLUCOSE, CAPILLARY - Abnormal; Notable for the following components:      Result Value   Glucose-Capillary 178 (*)    All other components within normal limits     IMAGES:   EKG:   CV: Echo 09/06/2014 Study Conclusions   - Left ventricle: The cavity size was normal. Wall thickness was  increased in a pattern of mild LVH. Systolic function was normal.  The estimated ejection fraction was in the range of 50% to 55%.  Wall motion was normal; there were no regional wall motion  abnormalities. Doppler parameters are consistent with abnormal  left ventricular relaxation (grade 1 diastolic dysfunction).  Past Medical History:  Diagnosis Date  . Anxiety   .  Arthritis    osteoarthritis   . Asthma    has been eval. by Dr. Gwenette Greet- last 06/2014  . Bipolar 1 disorder ([place])   . Complication of anesthesia    09/13/14: malignant HTN with inducction, case cx but no sourse identified; 11/05/14 remifentanyl infusion used to attenuate hypergynamic induction  . Depression   . Diabetes mellitus   . GERD (gastroesophageal reflux disease)   . Hyperlipidemia   . Hypertension   . Knee pain, chronic   . OSA on CPAP   . Pain in joint, ankle and foot 02/17/2013  . Pneumonia    hosp. for a couple of day- not sure when it was   . PTSD (post-traumatic stress disorder)   . Rotator cuff tear    bicep tendon   . Shortness of breath dyspnea   . Sleep apnea    uses CPAP intermittently, pt. doesn't remember where she had the study    Past Surgical History:  Procedure Laterality Date  . ANTERIOR CERVICAL DECOMP/DISCECTOMY FUSION N/A 11/05/2014   Procedure: ANTERIOR CERVICAL DECOMPRESSION/DISCECTOMY FUSION 1 LEVEL;  Surgeon: Consuella Lose, MD;  Location: [facility];  Service: Neurosurgery;  Laterality: N/A;  Cervical five- cervical seven anterior cervical decompression with fusion interbody prosthesis plating and bonegraft  . CARPAL TUNNEL RELEASE Left 2001   Left wrist  . CERVICAL LAMINECTOMY  03/31/20176   CERVICAL 4 SEVEN LAMINECTOMIES WITH LATERAL FUSION  . Cotton Osteotomy w/ Bone  Graft Right 09/30/2014   Rt foot @ [facility]  . GASTROC RECESSION EXTREMITY Right 3//20/14   Right foot  . HERNIA REPAIR     1996  . Lapidus fusion Right 01/10/12   Rt foot  . MIDTARSAL ARTHRODESIS Right 07/17/12   Rt foot  . POSTERIOR CERVICAL FUSION/FORAMINOTOMY N/A 09/16/2015   Procedure: Cervical four - seven laminectomies with lateral mass fusion;  Surgeon: Consuella Lose, MD;  Location: [facility];  Service: Neurosurgery;  Laterality: N/A;  C4-7 laminectomy with lateral mass fusion  . Remove implant deep Right 07/17/12   Rt foot  . Remove Implant Deep Right 07/22/2014   Rt  foot @ [facility]  . SHOULDER ARTHROSCOPY WITH ROTATOR CUFF REPAIR AND SUBACROMIAL DECOMPRESSION Left 07/31/2016   Procedure: LEFT SHOULDER ARTHROSCOPY WITH ROTATOR CUFF REPAIR;  Surgeon: Renette Butters, MD;  Location: [facility];  Service: Orthopedics;  Laterality: Left;  . TUBAL LIGATION      MEDICATIONS: . insulin glargine (LANTUS) 100 UNIT/ML injection  . amLODipine (NORVASC) 10 MG tablet  . atenolol (TENORMIN) 100 MG tablet  . budesonide-formoterol (SYMBICORT) 160-4.5 MCG/ACT inhaler  . capsaicin (ZOSTRIX) 0.025 % cream  . cyclobenzaprine (FLEXERIL) 10 MG tablet  . diazepam (VALIUM) 2 MG tablet  . DULoxetine (CYMBALTA) 60 MG capsule  . econazole nitrate 1 % cream  . gabapentin (NEURONTIN) 600 MG tablet  . glucose blood (ACCU-CHEK AVIVA PLUS) test strip  . hydrOXYzine (ATARAX/VISTARIL) 25 MG tablet  . mirtazapine (REMERON) 15 MG tablet  . oxyCODONE (ROXICODONE) 15 MG immediate release tablet  . sodium fluoride (FLUORISHIELD) 1.1 % GEL dental gel  . VENTOLIN HFA 108 (90 Base) MCG/ACT inhaler   No current facility-administered medications for this encounter.   Derrill Memo ON 08/23/2020] bupivacaine liposome (EXPAREL) 1.3 % injection 266 mg   Konrad Felix, PA-C [facility] [phone]

## 2020-08-22 NOTE — Progress Notes (Signed)
COVID Vaccine Completed:yes Date COVID Vaccine completed:10/21/19 COVID vaccine manufacturer: Pfizer       PCP - Dr. Darnell Level. Ose[place]ons[place] Cardiologist - Dr. Atilano Median  Chest x-ray - no EKG - 08/22/20 chart, Epic Stress Test - no ECHO - 2016-epic Cardiac Cath - 2018- care everywhere Pacemaker/ICD device last checked:NA  Sleep Study - yes CPAP - yes  Fasting Blood Sugar - 120-150 Checks Blood Sugar __4___ times a day. Her meter has been broken and she is waiting for another.  Blood Thinner Instructions:NA Aspirin Instructions: Last Dose:  Anesthesia review:   Patient denies shortness of breath, fever, cough and chest pain at PAT appointment yes  Patient verbalized understanding of instructions that were given to them at the PAT appointment. Patient was also instructed that they will need to review over the PAT instructions again at home before surgery.yes  Pt sometimes gets SOB moving around. Her pain level was 10/10 in the abd. She said that she didn't have anymore pain pills. Her BP was elevated 186/106 and HR was 120. Marland KitchenKonrad Felix PA was aware and the pt was told to call her Dr.'s office to discuss pain level and BP.

## 2020-08-23 ENCOUNTER — Encounter (HOSPITAL_COMMUNITY)
Admission: RE | Disposition: A | Discharge status: Home / Self Care | Payer: Self-pay | Source: Home / Self Care | Attending: Surgery

## 2020-08-23 ENCOUNTER — Ambulatory Visit (HOSPITAL_COMMUNITY): Payer: Medicare HMO | Admitting: Anesthesiology

## 2020-08-23 ENCOUNTER — Ambulatory Visit (HOSPITAL_COMMUNITY)
Admission: RE | Admit: 2020-08-23 | Discharge: 2020-08-23 | Disposition: A | Discharge status: Home / Self Care | Payer: Medicare HMO | Attending: Surgery | Admitting: Surgery

## 2020-08-23 ENCOUNTER — Ambulatory Visit (HOSPITAL_COMMUNITY): Payer: Medicare HMO | Admitting: Physician Assistant

## 2020-08-23 ENCOUNTER — Encounter (HOSPITAL_COMMUNITY): Payer: Self-pay | Admitting: Surgery

## 2020-08-23 DIAGNOSIS — Z87891 Personal history of nicotine dependence: Secondary | ICD-10-CM | POA: Insufficient documentation

## 2020-08-23 DIAGNOSIS — G4733 Obstructive sleep apnea (adult) (pediatric): Secondary | ICD-10-CM | POA: Diagnosis not present

## 2020-08-23 DIAGNOSIS — M795 Residual foreign body in soft tissue: Secondary | ICD-10-CM | POA: Insufficient documentation

## 2020-08-23 DIAGNOSIS — Z9989 Dependence on other enabling machines and devices: Secondary | ICD-10-CM | POA: Diagnosis not present

## 2020-08-23 DIAGNOSIS — R1084 Generalized abdominal pain: Secondary | ICD-10-CM | POA: Diagnosis not present

## 2020-08-23 DIAGNOSIS — E119 Type 2 diabetes mellitus without complications: Secondary | ICD-10-CM | POA: Diagnosis not present

## 2020-08-23 DIAGNOSIS — Z9889 Other specified postprocedural states: Secondary | ICD-10-CM | POA: Insufficient documentation

## 2020-08-23 DIAGNOSIS — R109 Unspecified abdominal pain: Secondary | ICD-10-CM | POA: Diagnosis not present

## 2020-08-23 DIAGNOSIS — Z880 Allergy status to penicillin: Secondary | ICD-10-CM | POA: Diagnosis not present

## 2020-08-23 DIAGNOSIS — K660 Peritoneal adhesions (postprocedural) (postinfection): Secondary | ICD-10-CM | POA: Insufficient documentation

## 2020-08-23 DIAGNOSIS — E785 Hyperlipidemia, unspecified: Secondary | ICD-10-CM | POA: Diagnosis not present

## 2020-08-23 HISTORY — PX: LAPAROSCOPY: SHX197

## 2020-08-23 LAB — TYPE AND SCREEN
ABO/RH(D): B POS
Antibody Screen: NEGATIVE

## 2020-08-23 LAB — GLUCOSE, CAPILLARY
Glucose-Capillary: 160 mg/dL — ABNORMAL HIGH (ref 70–99)
Glucose-Capillary: 167 mg/dL — ABNORMAL HIGH (ref 70–99)
Glucose-Capillary: 148 mg/dL — ABNORMAL HIGH (ref 70–99)

## 2020-08-23 LAB — PREGNANCY, URINE: Preg Test, Ur: NEGATIVE

## 2020-08-23 SURGERY — LAPAROSCOPY, DIAGNOSTIC
Anesthesia: General | Site: Abdomen

## 2020-08-23 MED ORDER — 0.9 % SODIUM CHLORIDE (POUR BTL) OPTIME
TOPICAL | Status: DC | PRN
  Administered 2020-08-23: 1000 mL

## 2020-08-23 MED ORDER — ESMOLOL HCL 100 MG/10ML IV SOLN
INTRAVENOUS | Status: AC
  Filled 2020-08-23: qty 10

## 2020-08-23 MED ORDER — NEOSTIGMINE METHYLSULFATE 3 MG/3ML IV SOSY
PREFILLED_SYRINGE | INTRAVENOUS | Status: AC
  Filled 2020-08-23: qty 6

## 2020-08-23 MED ORDER — ORAL CARE MOUTH RINSE: 15.0000 mL | Freq: Once | OROMUCOSAL | Status: AC

## 2020-08-23 MED ORDER — CHLORHEXIDINE GLUCONATE CLOTH 2 % EX PADS: 6.0000 | MEDICATED_PAD | Freq: Once | CUTANEOUS | Status: DC

## 2020-08-23 MED ORDER — SCOPOLAMINE 1 MG/3DAYS TD PT72
1.0000 | MEDICATED_PATCH | TRANSDERMAL | Status: DC
  Administered 2020-08-23: 1.5 mg via TRANSDERMAL
  Filled 2020-08-23: qty 1

## 2020-08-23 MED ORDER — PROMETHAZINE HCL 25 MG/ML IJ SOLN: 6.2500 mg | INTRAMUSCULAR | Status: DC | PRN

## 2020-08-23 MED ORDER — LIDOCAINE 2% (20 MG/ML) 5 ML SYRINGE
INTRAMUSCULAR | Status: DC | PRN
  Administered 2020-08-23: 80 mg via INTRAVENOUS

## 2020-08-23 MED ORDER — SUGAMMADEX SODIUM 500 MG/5ML IV SOLN
INTRAVENOUS | Status: AC
  Filled 2020-08-23: qty 10

## 2020-08-23 MED ORDER — VANCOMYCIN HCL 1500 MG/300ML IV SOLN
1500.0000 mg | Freq: Once | INTRAVENOUS | Status: AC
  Administered 2020-08-23: 1500 mg via INTRAVENOUS
  Filled 2020-08-23: qty 300

## 2020-08-23 MED ORDER — DEXMEDETOMIDINE (PRECEDEX) IN NS 20 MCG/5ML (4 MCG/ML) IV SYRINGE
PREFILLED_SYRINGE | INTRAVENOUS | Status: AC
  Filled 2020-08-23: qty 5

## 2020-08-23 MED ORDER — FENTANYL CITRATE (PF) 100 MCG/2ML IJ SOLN
INTRAMUSCULAR | Status: DC | PRN
  Administered 2020-08-23: 100 ug via INTRAVENOUS
  Administered 2020-08-23: 25 ug via INTRAVENOUS
  Administered 2020-08-23 (×2): 50 ug via INTRAVENOUS
  Administered 2020-08-23: 25 ug via INTRAVENOUS

## 2020-08-23 MED ORDER — DEXAMETHASONE SODIUM PHOSPHATE 10 MG/ML IJ SOLN
INTRAMUSCULAR | Status: DC | PRN
  Administered 2020-08-23: 8 mg via INTRAVENOUS

## 2020-08-23 MED ORDER — SUGAMMADEX SODIUM 500 MG/5ML IV SOLN: INTRAVENOUS | Status: DC | PRN

## 2020-08-23 MED ORDER — HYDROMORPHONE HCL 1 MG/ML IJ SOLN
INTRAMUSCULAR | Status: AC
  Filled 2020-08-23: qty 1

## 2020-08-23 MED ORDER — GABAPENTIN 300 MG PO CAPS: 300.0000 mg | ORAL_CAPSULE | ORAL | Status: DC

## 2020-08-23 MED ORDER — BUPIVACAINE-EPINEPHRINE 0.25% -1:200000 IJ SOLN
INTRAMUSCULAR | Status: DC | PRN
  Administered 2020-08-23: 30 mL

## 2020-08-23 MED ORDER — PROPOFOL 10 MG/ML IV BOLUS
INTRAVENOUS | Status: DC | PRN
  Administered 2020-08-23: 200 mg via INTRAVENOUS

## 2020-08-23 MED ORDER — ESMOLOL HCL 100 MG/10ML IV SOLN
INTRAVENOUS | Status: DC | PRN
  Administered 2020-08-23 (×6): 10 mg via INTRAVENOUS

## 2020-08-23 MED ORDER — BUPIVACAINE LIPOSOME 1.3 % IJ SUSP
INTRAMUSCULAR | Status: DC | PRN
  Administered 2020-08-23: 20 mL

## 2020-08-23 MED ORDER — DEXMEDETOMIDINE (PRECEDEX) IN NS 20 MCG/5ML (4 MCG/ML) IV SYRINGE
PREFILLED_SYRINGE | INTRAVENOUS | Status: DC | PRN
  Administered 2020-08-23: 8 ug via INTRAVENOUS
  Administered 2020-08-23 (×2): 4 ug via INTRAVENOUS

## 2020-08-23 MED ORDER — ONDANSETRON HCL 4 MG/2ML IJ SOLN
INTRAMUSCULAR | Status: DC | PRN
  Administered 2020-08-23: 4 mg via INTRAVENOUS

## 2020-08-23 MED ORDER — BUPIVACAINE-EPINEPHRINE (PF) 0.25% -1:200000 IJ SOLN
INTRAMUSCULAR | Status: AC
  Filled 2020-08-23: qty 30

## 2020-08-23 MED ORDER — CHLORHEXIDINE GLUCONATE 0.12 % MT SOLN
15.0000 mL | Freq: Once | OROMUCOSAL | Status: AC
  Administered 2020-08-23: 15 mL via OROMUCOSAL

## 2020-08-23 MED ORDER — GLYCOPYRROLATE PF 0.2 MG/ML IJ SOSY
PREFILLED_SYRINGE | INTRAMUSCULAR | Status: AC
  Filled 2020-08-23: qty 1

## 2020-08-23 MED ORDER — LACTATED RINGERS IV SOLN: INTRAVENOUS | Status: DC

## 2020-08-23 MED ORDER — ROCURONIUM BROMIDE 10 MG/ML (PF) SYRINGE
PREFILLED_SYRINGE | INTRAVENOUS | Status: DC | PRN
  Administered 2020-08-23: 70 mg via INTRAVENOUS

## 2020-08-23 MED ORDER — OXYCODONE HCL 5 MG/5ML PO SOLN: 5.0000 mg | Freq: Once | ORAL | Status: DC | PRN

## 2020-08-23 MED ORDER — NEOSTIGMINE METHYLSULFATE 10 MG/10ML IV SOLN
INTRAVENOUS | Status: DC | PRN
  Administered 2020-08-23: 6 mg via INTRAVENOUS

## 2020-08-23 MED ORDER — GLYCOPYRROLATE 0.2 MG/ML IJ SOLN
INTRAMUSCULAR | Status: DC | PRN
  Administered 2020-08-23: .2 mg via INTRAVENOUS

## 2020-08-23 MED ORDER — CEFAZOLIN SODIUM-DEXTROSE 2-4 GM/100ML-% IV SOLN
2.0000 g | INTRAVENOUS | Status: DC
  Filled 2020-08-23: qty 100

## 2020-08-23 MED ORDER — MIDAZOLAM HCL 2 MG/2ML IJ SOLN: 0.5000 mg | Freq: Once | INTRAMUSCULAR | Status: DC | PRN

## 2020-08-23 MED ORDER — HYDROMORPHONE HCL 1 MG/ML IJ SOLN
0.2500 mg | INTRAMUSCULAR | Status: DC | PRN
  Administered 2020-08-23: 0.5 mg via INTRAVENOUS

## 2020-08-23 MED ORDER — FENTANYL CITRATE (PF) 250 MCG/5ML IJ SOLN
INTRAMUSCULAR | Status: AC
  Filled 2020-08-23: qty 5

## 2020-08-23 MED ORDER — MIDAZOLAM HCL 5 MG/5ML IJ SOLN
INTRAMUSCULAR | Status: DC | PRN
  Administered 2020-08-23: 2 mg via INTRAVENOUS

## 2020-08-23 MED ORDER — MEPERIDINE HCL 50 MG/ML IJ SOLN: 6.2500 mg | INTRAMUSCULAR | Status: DC | PRN

## 2020-08-23 MED ORDER — SUGAMMADEX SODIUM 500 MG/5ML IV SOLN
INTRAVENOUS | Status: DC | PRN
  Administered 2020-08-23: 500 mg via INTRAVENOUS
  Administered 2020-08-23 (×2): 100 mg via INTRAVENOUS

## 2020-08-23 MED ORDER — LABETALOL HCL 5 MG/ML IV SOLN
INTRAVENOUS | Status: AC
  Administered 2020-08-23: 5 mg
  Filled 2020-08-23: qty 4

## 2020-08-23 MED ORDER — MIDAZOLAM HCL 2 MG/2ML IJ SOLN
INTRAMUSCULAR | Status: AC
  Filled 2020-08-23: qty 2

## 2020-08-23 MED ORDER — LABETALOL HCL 5 MG/ML IV SOLN
5.0000 mg | INTRAVENOUS | Status: DC | PRN
Start: 2020-08-23 — End: 2020-08-23

## 2020-08-23 MED ORDER — ACETAMINOPHEN 500 MG PO TABS
1000.0000 mg | ORAL_TABLET | ORAL | Status: DC
  Filled 2020-08-23: qty 2

## 2020-08-23 MED ORDER — DEXAMETHASONE SODIUM PHOSPHATE 10 MG/ML IJ SOLN
INTRAMUSCULAR | Status: AC
  Filled 2020-08-23: qty 1

## 2020-08-23 MED ORDER — OXYCODONE HCL 5 MG PO TABS: 5.0000 mg | ORAL_TABLET | Freq: Once | ORAL | Status: DC | PRN

## 2020-08-23 SURGICAL SUPPLY — 55 items
APPLIER CLIP 5 13 M/L LIGAMAX5 (MISCELLANEOUS)
APPLIER CLIP ROT 10 11.4 M/L (STAPLE)
BINDER ABDOMINAL 12 ML 46-62 (SOFTGOODS) ×2 IMPLANT
BLADE EXTENDED COATED 6.5IN (ELECTRODE) IMPLANT
BLADE SURG SZ10 CARB STEEL (BLADE) IMPLANT
CELLS DAT CNTRL 66122 CELL SVR (MISCELLANEOUS) IMPLANT
CHLORAPREP W/TINT 26 (MISCELLANEOUS) ×2 IMPLANT
CLIP APPLIE 5 13 M/L LIGAMAX5 (MISCELLANEOUS) IMPLANT
CLIP APPLIE ROT 10 11.4 M/L (STAPLE) IMPLANT
COVER MAYO STAND STRL (DRAPES) IMPLANT
COVER SURGICAL LIGHT HANDLE (MISCELLANEOUS) ×2 IMPLANT
COVER WAND RF STERILE (DRAPES) IMPLANT
DECANTER SPIKE VIAL GLASS SM (MISCELLANEOUS) ×2 IMPLANT
DERMABOND ADVANCED (GAUZE/BANDAGES/DRESSINGS) ×1
DERMABOND ADVANCED .7 DNX12 (GAUZE/BANDAGES/DRESSINGS) ×1 IMPLANT
DRAPE SHEET LG 3/4 BI-LAMINATE (DRAPES) IMPLANT
DRAPE WARM FLUID 44X44 (DRAPES) IMPLANT
ELECT REM PT RETURN 15FT ADLT (MISCELLANEOUS) ×2 IMPLANT
GAUZE SPONGE 4X4 12PLY STRL (GAUZE/BANDAGES/DRESSINGS) ×2 IMPLANT
GLOVE INDICATOR 8.0 STRL GRN (GLOVE) ×2 IMPLANT
GLOVE SURG ENC TEXT LTX SZ7.5 (GLOVE) ×2 IMPLANT
GOWN STRL REUS W/TWL XL LVL3 (GOWN DISPOSABLE) ×8 IMPLANT
HANDLE SUCTION POOLE (INSTRUMENTS) IMPLANT
IRRIG SUCT STRYKERFLOW 2 WTIP (MISCELLANEOUS)
IRRIGATION SUCT STRKRFLW 2 WTP (MISCELLANEOUS) IMPLANT
KIT BASIN OR (CUSTOM PROCEDURE TRAY) ×2 IMPLANT
KIT TURNOVER KIT A (KITS) ×2 IMPLANT
LEGGING LITHOTOMY PAIR STRL (DRAPES) IMPLANT
RTRCTR WOUND ALEXIS 18CM MED (MISCELLANEOUS)
SCISSORS LAP 5X35 DISP (ENDOMECHANICALS) ×2 IMPLANT
SHEARS HARMONIC ACE PLUS 36CM (ENDOMECHANICALS) IMPLANT
SLEEVE XCEL OPT CAN 5 100 (ENDOMECHANICALS) ×2 IMPLANT
SPONGE LAP 18X18 RF (DISPOSABLE) IMPLANT
STAPLER VISISTAT 35W (STAPLE) IMPLANT
STRIP CLOSURE SKIN 1/2X4 (GAUZE/BANDAGES/DRESSINGS) IMPLANT
SUCTION POOLE HANDLE (INSTRUMENTS)
SUT PDS AB 1 TP1 96 (SUTURE) IMPLANT
SUT PROLENE 2 0 KS (SUTURE) IMPLANT
SUT PROLENE 2 0 SH DA (SUTURE) IMPLANT
SUT SILK 2 0 (SUTURE)
SUT SILK 2 0 SH CR/8 (SUTURE) IMPLANT
SUT SILK 2-0 18XBRD TIE 12 (SUTURE) IMPLANT
SUT SILK 3 0 (SUTURE)
SUT SILK 3 0 SH CR/8 (SUTURE) IMPLANT
SUT SILK 3-0 18XBRD TIE 12 (SUTURE) IMPLANT
SYR BULB IRRIG 60ML STRL (SYRINGE) IMPLANT
SYS LAPSCP GELPORT 120MM (MISCELLANEOUS)
SYSTEM LAPSCP GELPORT 120MM (MISCELLANEOUS) IMPLANT
TOWEL OR 17X26 10 PK STRL BLUE (TOWEL DISPOSABLE) ×2 IMPLANT
TOWEL OR NON WOVEN STRL DISP B (DISPOSABLE) ×2 IMPLANT
TRAY FOLEY MTR SLVR 16FR STAT (SET/KITS/TRAYS/PACK) ×2 IMPLANT
TRAY LAPAROSCOPIC (CUSTOM PROCEDURE TRAY) ×2 IMPLANT
TROCAR BLADELESS OPT 5 100 (ENDOMECHANICALS) ×2 IMPLANT
TROCAR XCEL NON-BLD 11X100MML (ENDOMECHANICALS) IMPLANT
YANKAUER SUCT BULB TIP NO VENT (SUCTIONS) IMPLANT

## 2020-08-23 NOTE — Anesthesia Preprocedure Evaluation (Addendum)
Anesthesia Evaluation  Patient identified by MRN, date of birth, ID band Patient awake    Reviewed: Allergy & Precautions, NPO status , Patient's Chart, lab work & pertinent test results, reviewed documented beta blocker date and time   History of Anesthesia Complications Negative for: history of anesthetic complications  Airway Mallampati: II  TM Distance: >3 FB Neck ROM: Full    Dental  (+) Chipped, Missing, Dental Advisory Given   Pulmonary sleep apnea and Continuous Positive Airway Pressure Ventilation , COPD,  COPD inhaler, former smoker,    breath sounds clear to auscultation       Cardiovascular hypertension, Pt. on medications and Pt. on home beta blockers (-) angina Rhythm:Regular Rate:Normal  '16 ECHO: EF 50-55%, no significant valvular abnormalities   Neuro/Psych PSYCHIATRIC DISORDERS (Bipolar) Anxiety Depression Bipolar Disorder negative neurological ROS     GI/Hepatic Neg liver ROS, GERD  Controlled,  Endo/Other  diabetes, Insulin DependentMorbid obesity  Renal/GU Renal InsufficiencyRenal disease     Musculoskeletal  (+) Arthritis ,   Abdominal (+) + obese,   Peds  Hematology negative hematology ROS (+)   Anesthesia Other Findings   Reproductive/Obstetrics S/p BTL                            Anesthesia Physical Anesthesia Plan  ASA: III  Anesthesia Plan: General   Post-op Pain Management:    Induction: Intravenous  PONV Risk Score and Plan: 3 and Ondansetron and Dexamethasone  Airway Management Planned: Oral ETT  Additional Equipment: None  Intra-op Plan:   Post-operative Plan: Extubation in OR  Informed Consent: I have reviewed the patients History and Physical, chart, labs and discussed the procedure including the risks, benefits and alternatives for the proposed anesthesia with the patient or authorized representative who has indicated his/her understanding and  acceptance.     Dental advisory given  Plan Discussed with: CRNA and Surgeon  Anesthesia Plan Comments:        Anesthesia Quick Evaluation

## 2020-08-23 NOTE — Anesthesia Postprocedure Evaluation (Signed)
Anesthesia Post Note  Patient: Diana Henderson  Procedure(s) Performed: LAPAROSCOPIC LYSIS OF ADHESIONS AND OPEN REMOVAL OF ABDOMINAL WALL MESH (N/A Abdomen)     Patient location during evaluation: PACU Anesthesia Type: General Level of consciousness: awake and alert, patient cooperative and oriented Pain management: pain level controlled Vital Signs Assessment: post-procedure vital signs reviewed and stable Respiratory status: spontaneous breathing, nonlabored ventilation and respiratory function stable Cardiovascular status: blood pressure returned to baseline and stable Postop Assessment: no apparent nausea or vomiting, able to ambulate and adequate PO intake Anesthetic complications: no   No complications documented.  Last Vitals:  Vitals:   08/23/20 1538 08/23/20 1614  BP: (!) 161/95 (!) 165/104  Pulse: 96 100  Resp:  16  Temp:    SpO2: 98% 98%    Last Pain:  Vitals:   08/23/20 1600  TempSrc:   PainSc: 0-No pain                 Magaret Justo,E. Ivi Griffith

## 2020-08-23 NOTE — Op Note (Signed)
Operative Note  Diana Henderson  [id number]  [id number]  08/23/2020   Surgeon: Vikki Ports A ConnorMD  Procedure performed: Diagnostic laparoscopy, laparoscopic lysis of adhesions x45 minutes, exploration of abdominal wall with explantation of mesh  Preop diagnosis: Abdominal pain Post-op diagnosis/intraop findings: Omental adhesions to abdominal wall and prior mesh, intense cicatrix reaction surrounding prior mesh with no evidence of recurrent hernia, mass, or infection  Specimens: Abdominal wall mesh Retained items: No EBL: [id number] Complications: none  Description of procedure: After obtaining informed consent the patient was taken to the operating room and placed supine on operating room table wheregeneral endotracheal anesthesia was initiated, preoperative antibiotics were administered, SCDs applied, and a formal timeout was performed.  The abdomen was prepped and draped in usual sterile fashion.  Peritoneal access was gained using optical entry in the left upper quadrant and insufflation to 15 mmHg ensued without incident.  There are omental adhesions to the falciform into the midline abdominal wall but no other gross abnormalities.  2 additional 5 mm trochars were placed in the left hemiabdomen after infiltration with local (Exparel mixed with quarter percent Marcaine and epinephrine).  A combination of sharp, cautery and blunt dissection were used to clear the omental adhesions, ensuring hemostasis as we went.  There was no involved bowel.  Once the abdominal wall was cleared, there was a partially adherent foreign material noted, this was excised from the abdominal wall laparoscopically.  At this point a small periumbilical incision was made and the soft tissues dissected with cautery until the abdominal fascia was encountered, this was somewhat thickened with an intense cicatrix reaction surrounding the previous mesh, which the inner layer appeared to have been separated which was what we  had excised laparoscopically.  The peritoneal cavity was entered and the previously removed section of mesh was taken out.  The remaining previous mesh and securing Prolene sutures were then removed as much as possible as it remains unclear what the source of the patient's abdominal pain was and there was a fair amount of intense fibrotic scar reaction here, concordant with the chronic stranding noted on her previous CT.  All the excised for material was sent for pathology.  The remaining abdominal wall fascia is thin and quite attenuated, this was closed with a running looped #1 PDS.  The abdomen was then reinsufflated and the fascial closure inspected and confirmed to be airtight with no entrapped structures.  Abdominal hemostasis was confirmed.  Additional local was infiltrated surrounding the incisions.  The abdomen was then desufflated and skin incisions closed with running subcuticular 4-0 Monocryl and Dermabond.  The patient was then awakened, extubated and taken to PACU in stable condition.   All counts were correct at the completion of the case.

## 2020-08-23 NOTE — Discharge Instructions (Signed)
LAPAROSCOPIC SURGERY: POST OP INSTRUCTIONS   EAT Gradually transition to a high fiber diet with a fiber supplement over the next few weeks after discharge.  Start with a pureed / full liquid diet (see below)  WALK Walk an hour a day.  Control your pain to do that.    CONTROL PAIN Control pain so that you can walk, sleep, tolerate sneezing/coughing, go up/down stairs.  HAVE A BOWEL MOVEMENT DAILY Keep your bowels regular to avoid problems.  OK to try a laxative to override constipation.  OK to use an antidairrheal to slow down diarrhea.  Call if not better after 2 tries  CALL IF YOU HAVE PROBLEMS/CONCERNS Call if you are still struggling despite following these instructions. Call if you have concerns not answered by these instructions    1. DIET: Follow a light bland diet & liquids the first 24 hours after arrival home, such as soup, liquids, starches, etc.  Be sure to drink plenty of fluids.  Quickly advance to a usual solid diet within a few days.  Avoid fast food or heavy meals as your are more likely to get nauseated or have irregular bowels.  A low-sugar, high-fiber diet for the rest of your life is ideal.  2. Take your usually prescribed home medications unless otherwise directed.  3. PAIN CONTROL: a. Pain is best controlled by a usual combination of three different methods TOGETHER: i. Ice/Heat ii. Over the counter pain medication iii. Prescription pain medication b. Most patients will experience some swelling and bruising around the incisions.  Ice packs or heating pads (30-60 minutes up to 6 times a day) will help. Use ice for the first few days to help decrease swelling and bruising, then switch to heat to help relax tight/sore spots and speed recovery.  Some people prefer to use ice alone, heat alone, alternating between ice & heat.  Experiment to what works for you.  Swelling and bruising can take several weeks to resolve.   c. It is helpful to take an over-the-counter pain  medication regularly for the first few days: i. Naproxen (Aleve, etc)  Two 220mg  tabs twice a day OR Ibuprofen (Advil, etc) Three 200mg  tabs four times a day (every meal & bedtime) AND ii. Acetaminophen (Tylenol, etc) 500-650mg  four times a day (every meal & bedtime) d. Refills of your narcotic medication should be requested from your usual prescribing provider if needed.  i. If you are having problems/concerns with the prescription medicine (does not control pain, nausea, vomiting, rash, itching, etc), please call us [phone] to see if we need to switch you to a different pain medicine that will work better for you and/or control your side effect better. ii. If you need a refill on your pain medication, please give [place] 48 hour notice.  contact your pharmacy.  They will contact our office to request authorization. Prescriptions will not be filled after 5 pm or on week-ends  4. Avoid getting constipated.   a. Between the surgery and the pain medications, it is common to experience some constipation.   b. Increasing fluid intake and taking a fiber supplement (such as Metamucil, Citrucel, FiberCon, MiraLax, etc) 1-2 times a day regularly will usually help prevent this problem from occurring.   c. A mild laxative (prune juice, Milk of Magnesia, MiraLax, etc) should be taken according to package directions if there are no bowel movements after 48 hours.   5. Watch out for diarrhea.   a. If you have many loose  bowel movements, simplify your diet to bland foods & liquids for a few days.   b. Stop any stool softeners and decrease your fiber supplement.   c. Switching to mild anti-diarrheal medications (Kayopectate, Pepto Bismol) can help.   d. If this worsens or does not improve, please call us.  6. Wash / shower every day.  You may shower over the skin glue which is waterproof  7. Glue will flake off after about 2 weeks.  You may leave the incision open to air.  You may replace a dressing/Band-Aid  to cover the incision for comfort if you wish.  8. Wear your abdominal binder at all times for the next 2 weeks.  After this, wear it as needed when up and moving around.  9. ACTIVITIES as tolerated:   a. You may resume regular (light) daily activities beginning the next day--such as daily self-care, walking, climbing stairs--gradually increasing activities as tolerated.  If you can walk 30 minutes without difficulty, it is safe to try more intense activity such as jogging, treadmill, bicycling, low-impact aerobics, swimming, etc. b. Save the most intensive and strenuous activity for last such as sit-ups, heavy lifting, contact sports, etc  Refrain from any heavy lifting or straining until you are off narcotics for pain control.   c. DO NOT PUSH THROUGH PAIN.  Let pain be your guide: If it hurts to do something, don't do it.  Pain is your body warning you to avoid that activity for another week until the pain goes down. d. You may drive when you are no longer taking prescription pain medication, you can comfortably wear a seatbelt, and you can safely maneuver your car and apply brakes. e. Dennis Bast may have sexual intercourse when it is comfortable.  10. FOLLOW UP in our office a. Please call [facility] at [phone] to set up an appointment to see your surgeon in the office for a follow-up appointment approximately 2-3 weeks after your surgery. b. Make sure that you call for this appointment the day you arrive home to insure a convenient appointment time.  10. IF YOU HAVE DISABILITY OR FAMILY LEAVE FORMS, BRING THEM TO THE OFFICE FOR PROCESSING.  DO NOT GIVE THEM TO YOUR DOCTOR.   WHEN TO CALL us [phone]: 1. Poor pain control 2. Reactions / problems with new medications (rash/itching, nausea, etc)  3. Fever over 101.5 F (38.5 C) 4. Inability to urinate 5. Nausea and/or vomiting 6. Worsening swelling or bruising 7. Continued bleeding from incision. 8. Increased pain, redness, or drainage from  the incision   The clinic staff is available to answer your questions during regular business hours (8:30am-5pm).  Please don't hesitate to call and ask to speak to one of our nurses for clinical concerns.   If you have a medical emergency, go to the nearest emergency room or call 911.  A surgeon from [facility] is always on call at the [facility], [place], [place], [place], [place]  [address] ? MAIN: [phone] ? TOLL FREE: [phone] ?  FAX [phone] [email or web address]

## 2020-08-23 NOTE — Transfer of Care (Signed)
Immediate Anesthesia Transfer of Care Note  Patient: Diana Henderson  Procedure(s) Performed: Procedure(s) with comments: LAPAROSCOPIC LYSIS OF ADHESIONS AND OPEN REMOVAL OF ABDOMINAL WALL MESH (N/A) - 90  Patient Location: PACU  Anesthesia Type:General  Level of Consciousness:  sedated, patient cooperative and responds to stimulation  Airway & Oxygen Therapy:Patient Spontanous Breathing and Patient connected to face mask oxgen  Post-op Assessment:  Report given to PACU RN and Post -op Vital signs reviewed and stable  Post vital signs:  Reviewed and stable  Last Vitals:  Vitals:   08/23/20 0835  BP: (!) 151/88  Pulse: (!) 104  Resp: 18  Temp: 36.5 C  SpO2: 75%    Complications: No apparent anesthesia complications

## 2020-08-23 NOTE — Anesthesia Procedure Notes (Signed)
Procedure Name: Intubation Date/Time: 08/23/2020 9:59 AM Performed by: Lavina Hamman, CRNA Pre-anesthesia Checklist: Patient identified, Emergency Drugs available, Suction available, Patient being monitored and Timeout performed Patient Re-evaluated:Patient Re-evaluated prior to induction Oxygen Delivery Method: Circle system utilized Preoxygenation: Pre-oxygenation with 100% oxygen Induction Type: IV induction Ventilation: Mask ventilation without difficulty Laryngoscope Size: Mac and 3 Grade View: Grade I Tube type: Oral Tube size: 7.0 mm Number of attempts: 1 Airway Equipment and Method: Stylet Placement Confirmation: ETT inserted through vocal cords under direct vision,  positive ETCO2,  CO2 detector and breath sounds checked- equal and bilateral Secured at: 21 cm Tube secured with: Tape Dental Injury: Teeth and Oropharynx as per pre-operative assessment  Comments: ATOI

## 2020-08-23 NOTE — Interval H&P Note (Signed)
History and Physical Interval Note:  08/23/2020 8:41 AM  Diana Henderson  has presented today for surgery, with the diagnosis of abdominal pain.  The various methods of treatment have been discussed with the patient and family. After consideration of risks, benefits and other options for treatment, the patient has consented to  Procedure(s) with comments: LAPAROSCOPY DIAGNOSTIC, EXPLORATION OF ABDOMINAL WALL (N/A) - 90 as a surgical intervention.  The patient's history has been reviewed, patient examined, no change in status, stable for surgery.  I have reviewed the patient's chart and labs.  Questions were answered to the patient's satisfaction.     [name]

## 2020-08-24 ENCOUNTER — Encounter (HOSPITAL_COMMUNITY): Payer: Self-pay | Admitting: Surgery

## 2020-08-24 DIAGNOSIS — E119 Type 2 diabetes mellitus without complications: Secondary | ICD-10-CM | POA: Diagnosis not present

## 2020-08-24 LAB — SURGICAL PATHOLOGY

## 2020-08-25 DIAGNOSIS — E119 Type 2 diabetes mellitus without complications: Secondary | ICD-10-CM | POA: Diagnosis not present

## 2020-08-26 DIAGNOSIS — E119 Type 2 diabetes mellitus without complications: Secondary | ICD-10-CM | POA: Diagnosis not present

## 2020-08-27 DIAGNOSIS — E119 Type 2 diabetes mellitus without complications: Secondary | ICD-10-CM | POA: Diagnosis not present

## 2020-08-28 DIAGNOSIS — E119 Type 2 diabetes mellitus without complications: Secondary | ICD-10-CM | POA: Diagnosis not present

## 2020-08-29 DIAGNOSIS — E119 Type 2 diabetes mellitus without complications: Secondary | ICD-10-CM | POA: Diagnosis not present

## 2020-08-30 DIAGNOSIS — E119 Type 2 diabetes mellitus without complications: Secondary | ICD-10-CM | POA: Diagnosis not present

## 2020-08-31 DIAGNOSIS — E119 Type 2 diabetes mellitus without complications: Secondary | ICD-10-CM | POA: Diagnosis not present

## 2020-09-01 DIAGNOSIS — E119 Type 2 diabetes mellitus without complications: Secondary | ICD-10-CM | POA: Diagnosis not present

## 2020-09-02 DIAGNOSIS — E119 Type 2 diabetes mellitus without complications: Secondary | ICD-10-CM | POA: Diagnosis not present

## 2020-09-03 DIAGNOSIS — E119 Type 2 diabetes mellitus without complications: Secondary | ICD-10-CM | POA: Diagnosis not present

## 2020-09-04 DIAGNOSIS — E119 Type 2 diabetes mellitus without complications: Secondary | ICD-10-CM | POA: Diagnosis not present

## 2020-09-05 DIAGNOSIS — E119 Type 2 diabetes mellitus without complications: Secondary | ICD-10-CM | POA: Diagnosis not present

## 2020-09-06 DIAGNOSIS — Z79891 Long term (current) use of opiate analgesic: Secondary | ICD-10-CM | POA: Diagnosis not present

## 2020-09-06 DIAGNOSIS — I10 Essential (primary) hypertension: Secondary | ICD-10-CM | POA: Diagnosis not present

## 2020-09-06 DIAGNOSIS — F1120 Opioid dependence, uncomplicated: Secondary | ICD-10-CM | POA: Diagnosis not present

## 2020-09-06 DIAGNOSIS — Z7189 Other specified counseling: Secondary | ICD-10-CM | POA: Diagnosis not present

## 2020-09-06 DIAGNOSIS — Z6841 Body Mass Index (BMI) 40.0 and over, adult: Secondary | ICD-10-CM | POA: Diagnosis not present

## 2020-09-06 DIAGNOSIS — M25562 Pain in left knee: Secondary | ICD-10-CM | POA: Diagnosis not present

## 2020-09-06 DIAGNOSIS — M545 Low back pain, unspecified: Secondary | ICD-10-CM | POA: Diagnosis not present

## 2020-09-06 DIAGNOSIS — M79642 Pain in left hand: Secondary | ICD-10-CM | POA: Diagnosis not present

## 2020-09-06 DIAGNOSIS — E119 Type 2 diabetes mellitus without complications: Secondary | ICD-10-CM | POA: Diagnosis not present

## 2020-09-06 DIAGNOSIS — G8929 Other chronic pain: Secondary | ICD-10-CM | POA: Diagnosis not present

## 2020-09-06 DIAGNOSIS — G479 Sleep disorder, unspecified: Secondary | ICD-10-CM | POA: Diagnosis not present

## 2020-09-06 DIAGNOSIS — M25561 Pain in right knee: Secondary | ICD-10-CM | POA: Diagnosis not present

## 2020-09-06 DIAGNOSIS — G894 Chronic pain syndrome: Secondary | ICD-10-CM | POA: Diagnosis not present

## 2020-09-07 DIAGNOSIS — E119 Type 2 diabetes mellitus without complications: Secondary | ICD-10-CM | POA: Diagnosis not present

## 2020-09-08 DIAGNOSIS — E119 Type 2 diabetes mellitus without complications: Secondary | ICD-10-CM | POA: Diagnosis not present

## 2020-09-09 DIAGNOSIS — E119 Type 2 diabetes mellitus without complications: Secondary | ICD-10-CM | POA: Diagnosis not present

## 2020-09-09 DIAGNOSIS — G894 Chronic pain syndrome: Secondary | ICD-10-CM | POA: Diagnosis not present

## 2020-09-10 DIAGNOSIS — E119 Type 2 diabetes mellitus without complications: Secondary | ICD-10-CM | POA: Diagnosis not present

## 2020-09-11 DIAGNOSIS — E119 Type 2 diabetes mellitus without complications: Secondary | ICD-10-CM | POA: Diagnosis not present

## 2020-09-12 DIAGNOSIS — E119 Type 2 diabetes mellitus without complications: Secondary | ICD-10-CM | POA: Diagnosis not present

## 2020-09-13 DIAGNOSIS — E119 Type 2 diabetes mellitus without complications: Secondary | ICD-10-CM | POA: Diagnosis not present

## 2020-09-14 DIAGNOSIS — E119 Type 2 diabetes mellitus without complications: Secondary | ICD-10-CM | POA: Diagnosis not present

## 2020-09-15 ENCOUNTER — Ambulatory Visit (INDEPENDENT_AMBULATORY_CARE_PROVIDER_SITE_OTHER): Payer: Medicare HMO

## 2020-09-15 ENCOUNTER — Other Ambulatory Visit: Payer: Self-pay | Admitting: *Deleted

## 2020-09-15 ENCOUNTER — Other Ambulatory Visit: Payer: Self-pay

## 2020-09-15 ENCOUNTER — Encounter: Payer: Self-pay | Admitting: Podiatry

## 2020-09-15 ENCOUNTER — Ambulatory Visit (INDEPENDENT_AMBULATORY_CARE_PROVIDER_SITE_OTHER): Payer: Medicare HMO | Admitting: Podiatry

## 2020-09-15 DIAGNOSIS — E1169 Type 2 diabetes mellitus with other specified complication: Secondary | ICD-10-CM | POA: Diagnosis not present

## 2020-09-15 DIAGNOSIS — N920 Excessive and frequent menstruation with regular cycle: Secondary | ICD-10-CM | POA: Insufficient documentation

## 2020-09-15 DIAGNOSIS — S99929A Unspecified injury of unspecified foot, initial encounter: Secondary | ICD-10-CM | POA: Diagnosis not present

## 2020-09-15 DIAGNOSIS — S99922A Unspecified injury of left foot, initial encounter: Secondary | ICD-10-CM

## 2020-09-15 DIAGNOSIS — M19079 Primary osteoarthritis, unspecified ankle and foot: Secondary | ICD-10-CM | POA: Diagnosis not present

## 2020-09-15 DIAGNOSIS — B351 Tinea unguium: Secondary | ICD-10-CM

## 2020-09-15 DIAGNOSIS — E1142 Type 2 diabetes mellitus with diabetic polyneuropathy: Secondary | ICD-10-CM

## 2020-09-15 DIAGNOSIS — J4540 Moderate persistent asthma, uncomplicated: Secondary | ICD-10-CM | POA: Insufficient documentation

## 2020-09-15 DIAGNOSIS — W108XXA Fall (on) (from) other stairs and steps, initial encounter: Secondary | ICD-10-CM | POA: Diagnosis not present

## 2020-09-15 DIAGNOSIS — J449 Chronic obstructive pulmonary disease, unspecified: Secondary | ICD-10-CM | POA: Insufficient documentation

## 2020-09-15 DIAGNOSIS — Z794 Long term (current) use of insulin: Secondary | ICD-10-CM | POA: Insufficient documentation

## 2020-09-15 DIAGNOSIS — E119 Type 2 diabetes mellitus without complications: Secondary | ICD-10-CM | POA: Diagnosis not present

## 2020-09-15 DIAGNOSIS — I82459 Acute embolism and thrombosis of unspecified peroneal vein: Secondary | ICD-10-CM | POA: Insufficient documentation

## 2020-09-15 DIAGNOSIS — E1165 Type 2 diabetes mellitus with hyperglycemia: Secondary | ICD-10-CM | POA: Insufficient documentation

## 2020-09-15 DIAGNOSIS — K219 Gastro-esophageal reflux disease without esophagitis: Secondary | ICD-10-CM | POA: Insufficient documentation

## 2020-09-15 DIAGNOSIS — I119 Hypertensive heart disease without heart failure: Secondary | ICD-10-CM | POA: Insufficient documentation

## 2020-09-15 DIAGNOSIS — R1310 Dysphagia, unspecified: Secondary | ICD-10-CM | POA: Insufficient documentation

## 2020-09-15 MED ORDER — DEXAMETHASONE SODIUM PHOSPHATE 120 MG/30ML IJ SOLN
4.0000 mg | Freq: Once | INTRAMUSCULAR | Status: AC
  Administered 2020-09-15: 4 mg via INTRA_ARTICULAR
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
## 2020-09-16 DIAGNOSIS — E119 Type 2 diabetes mellitus without complications: Secondary | ICD-10-CM | POA: Diagnosis not present

## 2020-09-17 DIAGNOSIS — E119 Type 2 diabetes mellitus without complications: Secondary | ICD-10-CM | POA: Diagnosis not present

## 2020-09-18 DIAGNOSIS — E119 Type 2 diabetes mellitus without complications: Secondary | ICD-10-CM | POA: Diagnosis not present

## 2020-09-19 DIAGNOSIS — E119 Type 2 diabetes mellitus without complications: Secondary | ICD-10-CM | POA: Diagnosis not present

## 2020-09-20 DIAGNOSIS — E119 Type 2 diabetes mellitus without complications: Secondary | ICD-10-CM | POA: Diagnosis not present

## 2020-09-21 DIAGNOSIS — E119 Type 2 diabetes mellitus without complications: Secondary | ICD-10-CM | POA: Diagnosis not present

## 2020-09-22 DIAGNOSIS — E119 Type 2 diabetes mellitus without complications: Secondary | ICD-10-CM | POA: Diagnosis not present

## 2020-09-23 DIAGNOSIS — E119 Type 2 diabetes mellitus without complications: Secondary | ICD-10-CM | POA: Diagnosis not present

## 2020-09-24 DIAGNOSIS — E119 Type 2 diabetes mellitus without complications: Secondary | ICD-10-CM | POA: Diagnosis not present

## 2020-09-25 DIAGNOSIS — E119 Type 2 diabetes mellitus without complications: Secondary | ICD-10-CM | POA: Diagnosis not present

## 2020-09-26 DIAGNOSIS — Z8719 Personal history of other diseases of the digestive system: Secondary | ICD-10-CM | POA: Diagnosis not present

## 2020-09-26 DIAGNOSIS — K5900 Constipation, unspecified: Secondary | ICD-10-CM | POA: Diagnosis not present

## 2020-09-26 DIAGNOSIS — E119 Type 2 diabetes mellitus without complications: Secondary | ICD-10-CM | POA: Diagnosis not present

## 2020-09-26 DIAGNOSIS — R1110 Vomiting, unspecified: Secondary | ICD-10-CM | POA: Diagnosis not present

## 2020-09-26 DIAGNOSIS — R000 Tachycardia, unspecified: Secondary | ICD-10-CM | POA: Diagnosis not present

## 2020-09-27 DIAGNOSIS — E119 Type 2 diabetes mellitus without complications: Secondary | ICD-10-CM | POA: Diagnosis not present

## 2020-09-28 DIAGNOSIS — E119 Type 2 diabetes mellitus without complications: Secondary | ICD-10-CM | POA: Diagnosis not present

## 2020-09-29 DIAGNOSIS — E119 Type 2 diabetes mellitus without complications: Secondary | ICD-10-CM | POA: Diagnosis not present

## 2020-09-30 DIAGNOSIS — E119 Type 2 diabetes mellitus without complications: Secondary | ICD-10-CM | POA: Diagnosis not present

## 2020-10-01 DIAGNOSIS — E119 Type 2 diabetes mellitus without complications: Secondary | ICD-10-CM | POA: Diagnosis not present

## 2020-10-02 DIAGNOSIS — E119 Type 2 diabetes mellitus without complications: Secondary | ICD-10-CM | POA: Diagnosis not present

## 2020-10-03 DIAGNOSIS — E119 Type 2 diabetes mellitus without complications: Secondary | ICD-10-CM | POA: Diagnosis not present

## 2020-10-04 DIAGNOSIS — F1120 Opioid dependence, uncomplicated: Secondary | ICD-10-CM | POA: Diagnosis not present

## 2020-10-04 DIAGNOSIS — M25561 Pain in right knee: Secondary | ICD-10-CM | POA: Diagnosis not present

## 2020-10-04 DIAGNOSIS — J4540 Moderate persistent asthma, uncomplicated: Secondary | ICD-10-CM | POA: Diagnosis not present

## 2020-10-04 DIAGNOSIS — M79671 Pain in right foot: Secondary | ICD-10-CM | POA: Diagnosis not present

## 2020-10-04 DIAGNOSIS — E119 Type 2 diabetes mellitus without complications: Secondary | ICD-10-CM | POA: Diagnosis not present

## 2020-10-04 DIAGNOSIS — M25512 Pain in left shoulder: Secondary | ICD-10-CM | POA: Diagnosis not present

## 2020-10-04 DIAGNOSIS — I10 Essential (primary) hypertension: Secondary | ICD-10-CM | POA: Diagnosis not present

## 2020-10-04 DIAGNOSIS — G8929 Other chronic pain: Secondary | ICD-10-CM | POA: Diagnosis not present

## 2020-10-04 DIAGNOSIS — M79672 Pain in left foot: Secondary | ICD-10-CM | POA: Diagnosis not present

## 2020-10-04 DIAGNOSIS — Z79891 Long term (current) use of opiate analgesic: Secondary | ICD-10-CM | POA: Diagnosis not present

## 2020-10-04 DIAGNOSIS — I119 Hypertensive heart disease without heart failure: Secondary | ICD-10-CM | POA: Diagnosis not present

## 2020-10-04 DIAGNOSIS — M25562 Pain in left knee: Secondary | ICD-10-CM | POA: Diagnosis not present

## 2020-10-04 DIAGNOSIS — E1165 Type 2 diabetes mellitus with hyperglycemia: Secondary | ICD-10-CM | POA: Diagnosis not present

## 2020-10-04 DIAGNOSIS — M25511 Pain in right shoulder: Secondary | ICD-10-CM | POA: Diagnosis not present

## 2020-10-04 DIAGNOSIS — M79641 Pain in right hand: Secondary | ICD-10-CM | POA: Diagnosis not present

## 2020-10-04 DIAGNOSIS — N62 Hypertrophy of breast: Secondary | ICD-10-CM | POA: Diagnosis not present

## 2020-10-04 DIAGNOSIS — M545 Low back pain, unspecified: Secondary | ICD-10-CM | POA: Diagnosis not present

## 2020-10-04 DIAGNOSIS — G894 Chronic pain syndrome: Secondary | ICD-10-CM | POA: Diagnosis not present

## 2020-10-04 DIAGNOSIS — M79642 Pain in left hand: Secondary | ICD-10-CM | POA: Diagnosis not present

## 2020-10-04 DIAGNOSIS — Z794 Long term (current) use of insulin: Secondary | ICD-10-CM | POA: Diagnosis not present

## 2020-10-04 DIAGNOSIS — M25551 Pain in right hip: Secondary | ICD-10-CM | POA: Diagnosis not present

## 2020-10-05 DIAGNOSIS — E119 Type 2 diabetes mellitus without complications: Secondary | ICD-10-CM | POA: Diagnosis not present

## 2020-10-06 DIAGNOSIS — E119 Type 2 diabetes mellitus without complications: Secondary | ICD-10-CM | POA: Diagnosis not present

## 2020-10-07 DIAGNOSIS — E119 Type 2 diabetes mellitus without complications: Secondary | ICD-10-CM | POA: Diagnosis not present

## 2020-10-08 DIAGNOSIS — E119 Type 2 diabetes mellitus without complications: Secondary | ICD-10-CM | POA: Diagnosis not present

## 2020-10-09 DIAGNOSIS — E119 Type 2 diabetes mellitus without complications: Secondary | ICD-10-CM | POA: Diagnosis not present

## 2020-10-09 DIAGNOSIS — G894 Chronic pain syndrome: Secondary | ICD-10-CM | POA: Diagnosis not present

## 2020-10-10 DIAGNOSIS — E119 Type 2 diabetes mellitus without complications: Secondary | ICD-10-CM | POA: Diagnosis not present

## 2020-10-11 DIAGNOSIS — E119 Type 2 diabetes mellitus without complications: Secondary | ICD-10-CM | POA: Diagnosis not present

## 2020-10-12 DIAGNOSIS — R110 Nausea: Secondary | ICD-10-CM | POA: Diagnosis not present

## 2020-10-12 DIAGNOSIS — R1031 Right lower quadrant pain: Secondary | ICD-10-CM | POA: Diagnosis not present

## 2020-10-12 DIAGNOSIS — R109 Unspecified abdominal pain: Secondary | ICD-10-CM | POA: Diagnosis not present

## 2020-10-12 DIAGNOSIS — Z79891 Long term (current) use of opiate analgesic: Secondary | ICD-10-CM | POA: Diagnosis not present

## 2020-10-12 DIAGNOSIS — Z7984 Long term (current) use of oral hypoglycemic drugs: Secondary | ICD-10-CM | POA: Diagnosis not present

## 2020-10-12 DIAGNOSIS — R112 Nausea with vomiting, unspecified: Secondary | ICD-10-CM | POA: Diagnosis not present

## 2020-10-12 DIAGNOSIS — R1110 Vomiting, unspecified: Secondary | ICD-10-CM | POA: Diagnosis not present

## 2020-10-12 DIAGNOSIS — I10 Essential (primary) hypertension: Secondary | ICD-10-CM | POA: Diagnosis not present

## 2020-10-12 DIAGNOSIS — E7800 Pure hypercholesterolemia, unspecified: Secondary | ICD-10-CM | POA: Diagnosis not present

## 2020-10-12 DIAGNOSIS — E119 Type 2 diabetes mellitus without complications: Secondary | ICD-10-CM | POA: Diagnosis not present

## 2020-10-12 DIAGNOSIS — Z79899 Other long term (current) drug therapy: Secondary | ICD-10-CM | POA: Diagnosis not present

## 2020-10-13 DIAGNOSIS — Z79891 Long term (current) use of opiate analgesic: Secondary | ICD-10-CM | POA: Diagnosis not present

## 2020-10-13 DIAGNOSIS — M25512 Pain in left shoulder: Secondary | ICD-10-CM | POA: Diagnosis not present

## 2020-10-13 DIAGNOSIS — M79672 Pain in left foot: Secondary | ICD-10-CM | POA: Diagnosis not present

## 2020-10-13 DIAGNOSIS — M25551 Pain in right hip: Secondary | ICD-10-CM | POA: Diagnosis not present

## 2020-10-13 DIAGNOSIS — F1120 Opioid dependence, uncomplicated: Secondary | ICD-10-CM | POA: Diagnosis not present

## 2020-10-13 DIAGNOSIS — M542 Cervicalgia: Secondary | ICD-10-CM | POA: Diagnosis not present

## 2020-10-13 DIAGNOSIS — M79641 Pain in right hand: Secondary | ICD-10-CM | POA: Diagnosis not present

## 2020-10-13 DIAGNOSIS — M79642 Pain in left hand: Secondary | ICD-10-CM | POA: Diagnosis not present

## 2020-10-13 DIAGNOSIS — E119 Type 2 diabetes mellitus without complications: Secondary | ICD-10-CM | POA: Diagnosis not present

## 2020-10-13 DIAGNOSIS — M25511 Pain in right shoulder: Secondary | ICD-10-CM | POA: Diagnosis not present

## 2020-10-13 DIAGNOSIS — G8929 Other chronic pain: Secondary | ICD-10-CM | POA: Diagnosis not present

## 2020-10-13 DIAGNOSIS — M545 Low back pain, unspecified: Secondary | ICD-10-CM | POA: Diagnosis not present

## 2020-10-13 DIAGNOSIS — M79671 Pain in right foot: Secondary | ICD-10-CM | POA: Diagnosis not present

## 2020-10-13 DIAGNOSIS — G894 Chronic pain syndrome: Secondary | ICD-10-CM | POA: Diagnosis not present

## 2020-10-13 DIAGNOSIS — I10 Essential (primary) hypertension: Secondary | ICD-10-CM | POA: Diagnosis not present

## 2020-10-14 ENCOUNTER — Other Ambulatory Visit: Payer: Medicare HMO

## 2020-10-14 DIAGNOSIS — E119 Type 2 diabetes mellitus without complications: Secondary | ICD-10-CM | POA: Diagnosis not present

## 2020-10-15 DIAGNOSIS — E119 Type 2 diabetes mellitus without complications: Secondary | ICD-10-CM | POA: Diagnosis not present

## 2020-10-16 DIAGNOSIS — E119 Type 2 diabetes mellitus without complications: Secondary | ICD-10-CM | POA: Diagnosis not present

## 2020-10-17 ENCOUNTER — Ambulatory Visit (INDEPENDENT_AMBULATORY_CARE_PROVIDER_SITE_OTHER): Payer: Medicare HMO | Admitting: Podiatry

## 2020-10-17 DIAGNOSIS — Z5329 Procedure and treatment not carried out because of patient's decision for other reasons: Secondary | ICD-10-CM

## 2020-10-17 DIAGNOSIS — E119 Type 2 diabetes mellitus without complications: Secondary | ICD-10-CM | POA: Diagnosis not present

## 2020-10-17 NOTE — Progress Notes (Signed)
No show for appt. 

## 2020-10-18 DIAGNOSIS — E119 Type 2 diabetes mellitus without complications: Secondary | ICD-10-CM | POA: Diagnosis not present

## 2020-10-19 DIAGNOSIS — E119 Type 2 diabetes mellitus without complications: Secondary | ICD-10-CM | POA: Diagnosis not present

## 2020-10-20 DIAGNOSIS — E119 Type 2 diabetes mellitus without complications: Secondary | ICD-10-CM | POA: Diagnosis not present

## 2020-10-21 DIAGNOSIS — E119 Type 2 diabetes mellitus without complications: Secondary | ICD-10-CM | POA: Diagnosis not present

## 2020-10-22 DIAGNOSIS — E119 Type 2 diabetes mellitus without complications: Secondary | ICD-10-CM | POA: Diagnosis not present

## 2020-10-23 DIAGNOSIS — E119 Type 2 diabetes mellitus without complications: Secondary | ICD-10-CM | POA: Diagnosis not present

## 2020-10-24 DIAGNOSIS — E119 Type 2 diabetes mellitus without complications: Secondary | ICD-10-CM | POA: Diagnosis not present

## 2020-10-25 DIAGNOSIS — E119 Type 2 diabetes mellitus without complications: Secondary | ICD-10-CM | POA: Diagnosis not present

## 2020-10-26 DIAGNOSIS — E119 Type 2 diabetes mellitus without complications: Secondary | ICD-10-CM | POA: Diagnosis not present

## 2020-10-27 DIAGNOSIS — E119 Type 2 diabetes mellitus without complications: Secondary | ICD-10-CM | POA: Diagnosis not present

## 2020-10-28 DIAGNOSIS — E119 Type 2 diabetes mellitus without complications: Secondary | ICD-10-CM | POA: Diagnosis not present

## 2020-10-29 DIAGNOSIS — E119 Type 2 diabetes mellitus without complications: Secondary | ICD-10-CM | POA: Diagnosis not present

## 2020-10-30 DIAGNOSIS — E119 Type 2 diabetes mellitus without complications: Secondary | ICD-10-CM | POA: Diagnosis not present

## 2020-10-31 DIAGNOSIS — E119 Type 2 diabetes mellitus without complications: Secondary | ICD-10-CM | POA: Diagnosis not present

## 2020-11-01 DIAGNOSIS — E119 Type 2 diabetes mellitus without complications: Secondary | ICD-10-CM | POA: Diagnosis not present

## 2020-11-02 DIAGNOSIS — E119 Type 2 diabetes mellitus without complications: Secondary | ICD-10-CM | POA: Diagnosis not present

## 2020-11-03 DIAGNOSIS — E119 Type 2 diabetes mellitus without complications: Secondary | ICD-10-CM | POA: Diagnosis not present

## 2020-11-04 DIAGNOSIS — E119 Type 2 diabetes mellitus without complications: Secondary | ICD-10-CM | POA: Diagnosis not present

## 2020-11-05 ENCOUNTER — Other Ambulatory Visit: Payer: Self-pay

## 2020-11-05 ENCOUNTER — Encounter (HOSPITAL_COMMUNITY): Payer: Self-pay

## 2020-11-05 ENCOUNTER — Emergency Department (HOSPITAL_COMMUNITY)
Admission: EM | Admit: 2020-11-05 | Discharge: 2020-11-06 | Disposition: A | Discharge status: Home / Self Care | Payer: Medicare HMO | Attending: Emergency Medicine | Admitting: Emergency Medicine

## 2020-11-05 DIAGNOSIS — K219 Gastro-esophageal reflux disease without esophagitis: Secondary | ICD-10-CM | POA: Diagnosis not present

## 2020-11-05 DIAGNOSIS — Z87891 Personal history of nicotine dependence: Secondary | ICD-10-CM | POA: Insufficient documentation

## 2020-11-05 DIAGNOSIS — Z7951 Long term (current) use of inhaled steroids: Secondary | ICD-10-CM | POA: Insufficient documentation

## 2020-11-05 DIAGNOSIS — Z79899 Other long term (current) drug therapy: Secondary | ICD-10-CM | POA: Diagnosis not present

## 2020-11-05 DIAGNOSIS — J449 Chronic obstructive pulmonary disease, unspecified: Secondary | ICD-10-CM | POA: Insufficient documentation

## 2020-11-05 DIAGNOSIS — R000 Tachycardia, unspecified: Secondary | ICD-10-CM | POA: Diagnosis not present

## 2020-11-05 DIAGNOSIS — Z7984 Long term (current) use of oral hypoglycemic drugs: Secondary | ICD-10-CM | POA: Insufficient documentation

## 2020-11-05 DIAGNOSIS — I5032 Chronic diastolic (congestive) heart failure: Secondary | ICD-10-CM | POA: Insufficient documentation

## 2020-11-05 DIAGNOSIS — N3000 Acute cystitis without hematuria: Secondary | ICD-10-CM | POA: Diagnosis not present

## 2020-11-05 DIAGNOSIS — Z794 Long term (current) use of insulin: Secondary | ICD-10-CM | POA: Diagnosis not present

## 2020-11-05 DIAGNOSIS — J4540 Moderate persistent asthma, uncomplicated: Secondary | ICD-10-CM | POA: Insufficient documentation

## 2020-11-05 DIAGNOSIS — E119 Type 2 diabetes mellitus without complications: Secondary | ICD-10-CM | POA: Diagnosis not present

## 2020-11-05 DIAGNOSIS — R112 Nausea with vomiting, unspecified: Secondary | ICD-10-CM | POA: Diagnosis not present

## 2020-11-05 DIAGNOSIS — E1142 Type 2 diabetes mellitus with diabetic polyneuropathy: Secondary | ICD-10-CM | POA: Insufficient documentation

## 2020-11-05 DIAGNOSIS — N390 Urinary tract infection, site not specified: Secondary | ICD-10-CM | POA: Diagnosis not present

## 2020-11-05 DIAGNOSIS — I110 Hypertensive heart disease with heart failure: Secondary | ICD-10-CM | POA: Insufficient documentation

## 2020-11-05 DIAGNOSIS — K3189 Other diseases of stomach and duodenum: Secondary | ICD-10-CM | POA: Diagnosis not present

## 2020-11-05 DIAGNOSIS — K5900 Constipation, unspecified: Secondary | ICD-10-CM | POA: Insufficient documentation

## 2020-11-05 DIAGNOSIS — Z20822 Contact with and (suspected) exposure to covid-19: Secondary | ICD-10-CM | POA: Diagnosis not present

## 2020-11-05 DIAGNOSIS — Z91040 Latex allergy status: Secondary | ICD-10-CM | POA: Diagnosis not present

## 2020-11-05 DIAGNOSIS — K439 Ventral hernia without obstruction or gangrene: Secondary | ICD-10-CM

## 2020-11-05 DIAGNOSIS — K5909 Other constipation: Secondary | ICD-10-CM | POA: Diagnosis not present

## 2020-11-05 DIAGNOSIS — Q433 Congenital malformations of intestinal fixation: Secondary | ICD-10-CM | POA: Diagnosis not present

## 2020-11-05 DIAGNOSIS — R109 Unspecified abdominal pain: Secondary | ICD-10-CM | POA: Diagnosis present

## 2020-11-05 LAB — COMPREHENSIVE METABOLIC PANEL
ALT: 13 U/L (ref 0–44)
AST: 14 U/L — ABNORMAL LOW (ref 15–41)
Albumin: 3.7 g/dL (ref 3.5–5.0)
Alkaline Phosphatase: 55 U/L (ref 38–126)
Anion gap: 9 (ref 5–15)
BUN: 9 mg/dL (ref 6–20)
CO2: 26 mmol/L (ref 22–32)
Calcium: 9.5 mg/dL (ref 8.9–10.3)
Chloride: 102 mmol/L (ref 98–111)
Creatinine, Ser: 0.96 mg/dL (ref 0.44–1.00)
GFR, Estimated: >60 mL/min (ref >60)
Glucose, Bld: 111 mg/dL — ABNORMAL HIGH (ref 70–99)
Potassium: 3.5 mmol/L (ref 3.5–5.1)
Sodium: 137 mmol/L (ref 135–145)
Total Bilirubin: 0.8 mg/dL (ref 0.3–1.2)
Total Protein: 6.7 g/dL (ref 6.5–8.1)

## 2020-11-05 LAB — CBC
HCT: 41.5 % (ref 36.0–46.0)
Hemoglobin: 13.1 g/dL (ref 12.0–15.0)
MCH: 26.4 pg (ref 26.0–34.0)
MCHC: 31.6 g/dL (ref 30.0–36.0)
MCV: 83.5 fL (ref 80.0–100.0)
Platelets: 237 10*3/uL (ref 150–400)
RBC: 4.97 MIL/uL (ref 3.87–5.11)
RDW: 14.5 % (ref 11.5–15.5)
WBC: 6.9 10*3/uL (ref 4.0–10.5)
nRBC: 0 % (ref 0.0–0.2)

## 2020-11-05 LAB — LIPASE, BLOOD: Lipase: 24 U/L (ref 11–51)

## 2020-11-05 LAB — I-STAT BETA HCG BLOOD, ED (MC, WL, AP ONLY): I-stat hCG, quantitative: <5 m[IU]/mL (ref <5)

## 2020-11-05 NOTE — ED Triage Notes (Signed)
Had umbilical repair in March and has been having abdominal pain, nausea and vomiting ever since.

## 2020-11-06 ENCOUNTER — Emergency Department (HOSPITAL_COMMUNITY): Payer: Medicare HMO

## 2020-11-06 ENCOUNTER — Encounter (HOSPITAL_COMMUNITY): Payer: Self-pay | Admitting: Emergency Medicine

## 2020-11-06 DIAGNOSIS — K5900 Constipation, unspecified: Secondary | ICD-10-CM | POA: Diagnosis not present

## 2020-11-06 DIAGNOSIS — E119 Type 2 diabetes mellitus without complications: Secondary | ICD-10-CM | POA: Diagnosis not present

## 2020-11-06 DIAGNOSIS — K3189 Other diseases of stomach and duodenum: Secondary | ICD-10-CM | POA: Diagnosis not present

## 2020-11-06 DIAGNOSIS — Q433 Congenital malformations of intestinal fixation: Secondary | ICD-10-CM | POA: Diagnosis not present

## 2020-11-06 DIAGNOSIS — K5909 Other constipation: Secondary | ICD-10-CM | POA: Diagnosis not present

## 2020-11-06 LAB — URINALYSIS, ROUTINE W REFLEX MICROSCOPIC
Bilirubin Urine: NEGATIVE
Glucose, UA: NEGATIVE mg/dL
Ketones, ur: 20 mg/dL — AB
Nitrite: NEGATIVE
Protein, ur: 100 mg/dL — AB
Specific Gravity, Urine: 1.018 (ref 1.005–1.030)
pH: 5 (ref 5.0–8.0)

## 2020-11-06 LAB — RAPID URINE DRUG SCREEN, HOSP PERFORMED
Amphetamines: NOT DETECTED
Barbiturates: NOT DETECTED
Benzodiazepines: NOT DETECTED
Cocaine: NOT DETECTED
Opiates: NOT DETECTED
Tetrahydrocannabinol: NOT DETECTED

## 2020-11-06 LAB — RESP PANEL BY RT-PCR (FLU A&B, COVID) ARPGX2
Influenza A by PCR: NEGATIVE
Influenza B by PCR: NEGATIVE
SARS Coronavirus 2 by RT PCR: NEGATIVE

## 2020-11-06 MED ORDER — KETOROLAC TROMETHAMINE 30 MG/ML IJ SOLN
30.0000 mg | Freq: Once | INTRAMUSCULAR | Status: AC
  Administered 2020-11-06: 30 mg via INTRAVENOUS
  Filled 2020-11-06: qty 1

## 2020-11-06 MED ORDER — SODIUM CHLORIDE 0.9 % IV BOLUS
500.0000 mL | Freq: Once | INTRAVENOUS | Status: AC
  Administered 2020-11-06: 500 mL via INTRAVENOUS

## 2020-11-06 MED ORDER — ONDANSETRON HCL 4 MG/2ML IJ SOLN
4.0000 mg | Freq: Once | INTRAMUSCULAR | Status: AC
  Administered 2020-11-06: 4 mg via INTRAVENOUS
  Filled 2020-11-06: qty 2

## 2020-11-06 MED ORDER — KETOROLAC TROMETHAMINE 60 MG/2ML IM SOLN
60.0000 mg | Freq: Once | INTRAMUSCULAR | Status: DC
  Filled 2020-11-06: qty 2

## 2020-11-06 MED ORDER — AMLODIPINE BESYLATE 5 MG PO TABS
10.0000 mg | ORAL_TABLET | Freq: Once | ORAL | Status: AC
  Administered 2020-11-06: 10 mg via ORAL
  Filled 2020-11-06: qty 2

## 2020-11-06 MED ORDER — ONDANSETRON 8 MG PO TBDP: ORAL_TABLET | ORAL | 0 refills | Status: DC

## 2020-11-06 MED ORDER — ONDANSETRON 4 MG PO TBDP
8.0000 mg | ORAL_TABLET | Freq: Once | ORAL | Status: DC
  Filled 2020-11-06: qty 2

## 2020-11-06 MED ORDER — SULFAMETHOXAZOLE-TRIMETHOPRIM 800-160 MG PO TABS: 1.0000 | ORAL_TABLET | Freq: Two times a day (BID) | ORAL | 0 refills | Status: AC

## 2020-11-06 MED ORDER — SULFAMETHOXAZOLE-TRIMETHOPRIM 800-160 MG PO TABS
1.0000 | ORAL_TABLET | Freq: Once | ORAL | Status: AC
  Administered 2020-11-06: 1 via ORAL
  Filled 2020-11-06: qty 1

## 2020-11-06 NOTE — ED Provider Notes (Addendum)
[facility] EMERGENCY DEPARTMENT Provider Note   CSN: [id number] Arrival date & time: 11/05/20  2057     History Chief Complaint  Patient presents with  . Emesis  . Abdominal Pain  . Post-op Problem    Diana Henderson is a 50 y.o. female.  The history is provided by the patient.  Emesis Severity:  Moderate Duration:  2 months Timing:  Intermittent Quality:  Stomach contents Progression:  Unchanged Chronicity:  New Recent urination:  Normal Context: not post-tussive   Relieved by:  Nothing Worsened by:  Nothing Ineffective treatments:  None tried Associated symptoms: abdominal pain   Associated symptoms: no arthralgias, no chills, no cough, no diarrhea, no fever, no headaches, no myalgias, no sore throat and no URI   Risk factors: prior abdominal surgery   Abdominal Pain Associated symptoms: vomiting   Associated symptoms: no chills, no cough, no diarrhea, no dysuria, no fever and no sore throat   Patient with h/o lysis of adhesions and removal of abdominal mesh in March by Dr. Kae Heller states she has had ongoing pain and emesis since that surgery.  Patient reports she has been unable to hold down POs.  She states that she called surgery 2 days ago and was told the surgeon was in the OR.  Family called again today as patient was unable to tolerate liquids and had a feeling of distention and the patient was directed into the ED.  No f/c/r. No diarrhea.  No urinary symptoms.        Past Medical History:  Diagnosis Date  . Anxiety   . Arthritis    osteoarthritis   . Asthma    has been eval. by Dr. Gwenette Greet- last 06/2014  . Bipolar 1 disorder ([place])   . Complication of anesthesia    09/13/14: malignant HTN with inducction, case cx but no sourse identified; 11/05/14 remifentanyl infusion used to attenuate hypergynamic induction  . Depression   . Diabetes mellitus   . GERD (gastroesophageal reflux disease)   . Hyperlipidemia   . Hypertension   . Knee  pain, chronic   . OSA on CPAP   . Pain in joint, ankle and foot 02/17/2013  . Pneumonia    hosp. for a couple of day- not sure when it was   . PTSD (post-traumatic stress disorder)   . Rotator cuff tear    bicep tendon   . Shortness of breath dyspnea   . Sleep apnea    uses CPAP intermittently, pt. doesn't remember where she had the study    Patient Active Problem List   Diagnosis Date Noted  . Chronic obstructive pulmonary disease ([place]) 09/15/2020  . Deep venous thrombosis of peroneal vein ([place]) 09/15/2020  . Dysphagia 09/15/2020  . Excessive and frequent menstruation 09/15/2020  . Gastro-esophageal reflux disease without esophagitis 09/15/2020  . Hyperglycemia due to type 2 diabetes mellitus ([place]) 09/15/2020  . Hypertensive heart disease without congestive heart failure 09/15/2020  . Long term (current) use of insulin ([place]) 09/15/2020  . Moderate persistent asthma, uncomplicated [id number]  . Primary osteoarthritis, unspecified ankle and foot 09/15/2020  . Diabetic polyneuropathy associated with type 2 diabetes mellitus ([place]) 08/25/2019  . Plantar fasciitis, left 08/25/2019  . Tendonitis of ankle or foot 08/25/2019  . Tight heel cords, acquired, bilateral 08/25/2019  . Dyslipidemia 10/22/2016  . Left rotator cuff tear 07/31/2016  . Diabetes mellitus, insulin dependent (IDDM), uncontrolled 09/18/2015  . AKI (acute kidney injury) ([place]) 09/18/2015  . Morbid obesity ([place])  09/18/2015  . Asthma 09/18/2015  . Uncontrolled type 2 diabetes mellitus with complication ([place])   . Chronic diastolic CHF (congestive heart failure), NYHA class 1 ([place])   . PTSD (post-traumatic stress disorder)   . Bipolar I disorder ([place])   . Anxiety state   . Knee pain, chronic   . Cervical spondylosis with myelopathy 11/05/2014  . HTN (hypertension), malignant 09/08/2014  . HNP (herniated nucleus pulposus) with myelopathy, cervical 06/29/2014  . Deformity of metatarsal bone of right foot 06/28/2014  .  CTS (carpal tunnel syndrome) 05/04/2014  . OSA (obstructive sleep apnea) 05/04/2014  . Hereditary and idiopathic peripheral neuropathy 05/04/2014  . Ganglion cyst 03/10/2014  . Vision loss, left eye 02/18/2014  . Conjunctivitis 02/17/2014  . Asthma exacerbation 01/13/2014  . Abdominal pain, unspecified site 10/28/2013  . Injury of foot, right, superficial 10/23/2013  . Tenosynovitis of foot 08/17/2013  . Pain in joint, ankle and foot 02/17/2013  . Edema of foot 02/17/2013  . Status post foot surgery 09/09/2012  . Hyperlipidemia   . 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 ANTERIOR CERVICAL DECOMP/DISCECTOMY FUSION N/A 11/05/2014   Procedure: ANTERIOR CERVICAL DECOMPRESSION/DISCECTOMY FUSION 1 LEVEL;  Surgeon: Consuella Lose, MD;  Location: [facility];  Service: Neurosurgery;  Laterality: N/A;  Cervical five- cervical seven anterior cervical decompression with fusion interbody prosthesis plating and bonegraft  . CARPAL TUNNEL RELEASE Left 2001   Left wrist  . CERVICAL LAMINECTOMY  03/31/20176   CERVICAL 4 SEVEN LAMINECTOMIES WITH LATERAL FUSION  . Cotton Osteotomy w/ Bone Graft Right 09/30/2014   Rt foot @ [facility]  . GASTROC RECESSION EXTREMITY Right 3//20/14   Right foot  . HERNIA REPAIR     1996  . LAPAROSCOPY N/A 08/23/2020   Procedure: LAPAROSCOPIC LYSIS OF ADHESIONS AND OPEN REMOVAL OF ABDOMINAL WALL MESH;  Surgeon: Clovis Riley, MD;  Location: [facility];  Service: General;  Laterality: N/A;  90  . Lapidus fusion Right 01/10/12   Rt foot  . MIDTARSAL ARTHRODESIS Right 07/17/12   Rt foot  . POSTERIOR CERVICAL FUSION/FORAMINOTOMY N/A 09/16/2015   Procedure: Cervical four - seven laminectomies with lateral mass fusion;  Surgeon: Consuella Lose, MD;  Location: [facility];  Service: Neurosurgery;  Laterality: N/A;  C4-7 laminectomy with lateral mass fusion  . Remove implant deep Right  07/17/12   Rt foot  . Remove Implant Deep Right 07/22/2014   Rt foot @ [facility]  . SHOULDER ARTHROSCOPY WITH ROTATOR CUFF REPAIR AND SUBACROMIAL DECOMPRESSION Left 07/31/2016   Procedure: LEFT SHOULDER ARTHROSCOPY WITH ROTATOR CUFF REPAIR;  Surgeon: Renette Butters, MD;  Location: [facility];  Service: Orthopedics;  Laterality: Left;  . TUBAL LIGATION       OB History   No obstetric history on file.     Family History  Problem Relation Age of Onset  . Kidney disease Mother   . Heart attack Father     Social History   Tobacco Use  . Smoking status: Former Smoker    Packs/day: 2.00    Years: 6.00    Pack years: 12.00    Types: Cigarettes    Quit date: 06/18/1978    Years since quitting: 42.4  . Smokeless tobacco: Never Used  Vaping Use  . Vaping Use: Never used  Substance Use Topics  . Alcohol use: Yes    Alcohol/week: 0.0 standard drinks    Comment: weekend- use  of beer   . Drug use: No    Home Medications Prior to Admission medications   Medication Sig Start Date End Date Taking? Authorizing Provider  amLODipine (NORVASC) 10 MG tablet Take 10 mg by mouth daily.  01/20/16  Yes [provider]  atenolol-chlorthalidone (TENORETIC) 100-25 MG tablet Take 1 tablet by mouth daily.   Yes [provider]  BD PEN NEEDLE NANO 2ND GEN 32G X 4 MM MISC See admin instructions. 06/27/20  Yes [provider]  bisacodyl (DULCOLAX) 10 MG suppository Place 10 mg rectally daily as needed for mild constipation. 09/09/20  Yes [provider]  budesonide-formoterol (SYMBICORT) 160-4.5 MCG/ACT inhaler Inhale 2 puffs into the lungs 2 (two) times daily. 05/24/16  Yes Parrett, Tammy S, NP  capsaicin (ZOSTRIX) 0.025 % cream Apply topically 2 (two) times daily. 07/26/20  Yes Charlesetta Shanks, MD  cyclobenzaprine (FLEXERIL) 10 MG tablet Take 10 mg by mouth every 12 (twelve) hours as needed for muscle spasms. 07/30/19  Yes [provider]  diazepam (VALIUM) 2 MG tablet Take 1  tablet (2 mg total) by mouth every 6 (six) hours as needed for muscle spasms. 08/18/18  Yes Nita Sells, MD  diclofenac (VOLTAREN) 75 MG EC tablet Take 75 mg by mouth 2 (two) times daily. 07/12/20  Yes [provider]  dicyclomine (BENTYL) 20 MG tablet Take 20 mg by mouth every 6 (six) hours as needed for spasms. 10/12/20  Yes [provider]  DULoxetine (CYMBALTA) 60 MG capsule Take 60 mg by mouth daily.   Yes [provider]  econazole nitrate 1 % cream Apply 1 application topically daily. 08/26/19  Yes [provider]  glucose blood (ACCU-CHEK AVIVA PLUS) test strip TEST FOUR TIMES DAILY 07/31/19  Yes [provider]  hydrOXYzine (ATARAX/VISTARIL) 25 MG tablet Take 25 mg by mouth at bedtime. 07/31/19  Yes [provider]  LEVEMIR FLEXTOUCH 100 UNIT/ML FlexPen Inject 10 Units into the skin daily. 06/27/20  Yes [provider]  magnesium hydroxide (MILK OF MAGNESIA) 400 MG/5ML suspension Take 30 mLs by mouth daily as needed for mild constipation.   Yes [provider]  metFORMIN (GLUCOPHAGE) 1000 MG tablet Take 1,000 mg by mouth 2 (two) times daily. 06/30/20  Yes [provider]  metoCLOPramide (REGLAN) 5 MG tablet Take 5 mg by mouth 4 (four) times daily. 10/12/20  Yes [provider]  mirtazapine (REMERON) 15 MG tablet Take 15 mg by mouth at bedtime. 08/11/18  Yes [provider]  oxyCODONE (ROXICODONE) 15 MG immediate release tablet Take 15 mg by mouth every 6 (six) hours as needed for pain. 07/30/19  Yes [provider]  sodium fluoride (FLUORISHIELD) 1.1 % GEL dental gel Place 1 application onto teeth at bedtime. 08/12/19  Yes [provider]  VENTOLIN HFA 108 (90 Base) MCG/ACT inhaler INHALE 1 TO 2 PUFFS INTO THE LUNGS EVERY 6 (SIX) HOURS AS NEEDED FOR WHEEZING OR SHORTNESS OF BREATH. Patient taking differently: Inhale 1-2 puffs into the lungs every 6 (six) hours as needed for  wheezing or shortness of breath. 10/11/16  Yes Rigoberto Noel, MD  VICTOZA 18 MG/3ML SOPN Inject 0.6 mg into the skin daily. 07/01/20  Yes [provider]    Allergies    Celery (apium graveolens var. dulce) skin test, Celery oil, Pork-derived products, Latex, Other, Penicillins, and Tape  Review of Systems   Review of Systems  Constitutional: Negative for chills and fever.  HENT: Negative for sore throat.  Respiratory: Negative for cough.   Cardiovascular: Negative for leg swelling.  Gastrointestinal: Positive for abdominal pain and vomiting. Negative for diarrhea.  Genitourinary: Negative for dysuria.  Musculoskeletal: Negative for arthralgias and myalgias.  Skin: Negative for rash.  Neurological: Negative for headaches.  Psychiatric/Behavioral: Negative for agitation.  All other systems reviewed and are negative.   Physical Exam Updated Vital Signs BP (!) 172/92   Pulse 98   Temp 99.3 F (37.4 C) (Oral)   Resp 16   Ht 5\' 5"  (1.651 m)   Wt 108 kg   SpO2 100%   BMI 39.62 kg/m   Physical Exam Vitals and nursing note reviewed.  Constitutional:      General: She is not in acute distress.    Appearance: Normal appearance.  HENT:     Head: Normocephalic and atraumatic.     Nose: Nose normal.  Eyes:     Conjunctiva/sclera: Conjunctivae normal.     Pupils: Pupils are equal, round, and reactive to light.  Cardiovascular:     Rate and Rhythm: Regular rhythm. Tachycardia present.     Pulses: Normal pulses.     Heart sounds: Normal heart sounds.  Pulmonary:     Effort: Pulmonary effort is normal.     Breath sounds: Normal breath sounds.  Abdominal:     General: Bowel sounds are normal.     Palpations: Abdomen is soft.     Tenderness: There is no abdominal tenderness. There is no guarding or rebound.  Musculoskeletal:        General: Normal range of motion.     Cervical back: Normal range of motion and neck supple.  Skin:    General: Skin is warm and dry.      Capillary Refill: Capillary refill takes less than 2 seconds.  Neurological:     General: No focal deficit present.     Mental Status: She is alert.     Deep Tendon Reflexes: Reflexes normal.  Psychiatric:        Mood and Affect: Mood normal.        Behavior: Behavior normal.     ED Results / Procedures / Treatments   Labs (all labs ordered are listed, but only abnormal results are displayed) Results for orders placed or performed during the hospital encounter of 11/05/20  Resp Panel by RT-PCR (Flu A&B, Covid) Nasopharyngeal Swab   Specimen: Nasopharyngeal Swab; Nasopharyngeal(NP) swabs in vial transport medium  Result Value Ref Range   SARS Coronavirus 2 by RT PCR NEGATIVE NEGATIVE   Influenza A by PCR NEGATIVE NEGATIVE   Influenza B by PCR NEGATIVE NEGATIVE  Lipase, blood  Result Value Ref Range   Lipase 24 11 - 51 U/L  Comprehensive metabolic panel  Result Value Ref Range   Sodium 137 135 - 145 mmol/L   Potassium 3.5 3.5 - 5.1 mmol/L   Chloride 102 98 - 111 mmol/L   CO2 26 22 - 32 mmol/L   Glucose, Bld 111 (H) 70 - 99 mg/dL   BUN 9 6 - 20 mg/dL   Creatinine, Ser 0.96 0.44 - 1.00 mg/dL   Calcium 9.5 8.9 - 10.3 mg/dL   Total Protein 6.7 6.5 - 8.1 g/dL   Albumin 3.7 3.5 - 5.0 g/dL   AST 14 (L) 15 - 41 U/L   ALT 13 0 - 44 U/L   Alkaline Phosphatase 55 38 - 126 U/L   Total Bilirubin 0.8 0.3 - 1.2 mg/dL   GFR, Estimated >60 >60 mL/min  Anion gap 9 5 - 15  CBC  Result Value Ref Range   WBC 6.9 4.0 - 10.5 K/uL   RBC 4.97 3.87 - 5.11 MIL/uL   Hemoglobin 13.1 12.0 - 15.0 g/dL   HCT 41.5 36.0 - 46.0 %   MCV 83.5 80.0 - 100.0 fL   MCH 26.4 26.0 - 34.0 pg   MCHC 31.6 30.0 - 36.0 g/dL   RDW 14.5 11.5 - 15.5 %   Platelets 237 150 - 400 K/uL   nRBC 0.0 0.0 - 0.2 %  Urinalysis, Routine w reflex microscopic Urine, Clean Catch  Result Value Ref Range   Color, Urine AMBER (A) YELLOW   APPearance CLOUDY (A) CLEAR   Specific Gravity, Urine 1.018 1.005 - 1.030   pH 5.0 5.0 -  8.0   Glucose, UA NEGATIVE NEGATIVE mg/dL   Hgb urine dipstick SMALL (A) NEGATIVE   Bilirubin Urine NEGATIVE NEGATIVE   Ketones, ur 20 (A) NEGATIVE mg/dL   Protein, ur 100 (A) NEGATIVE mg/dL   Nitrite NEGATIVE NEGATIVE   Leukocytes,Ua SMALL (A) NEGATIVE   RBC / HPF 6-10 0 - 5 RBC/hpf   WBC, UA 11-20 0 - 5 WBC/hpf   Bacteria, UA MANY (A) NONE SEEN   Squamous Epithelial / LPF 11-20 0 - 5   Mucus PRESENT    Hyaline Casts, UA PRESENT   Rapid urine drug screen (hospital performed)  Result Value Ref Range   Opiates NONE DETECTED NONE DETECTED   Cocaine NONE DETECTED NONE DETECTED   Benzodiazepines NONE DETECTED NONE DETECTED   Amphetamines NONE DETECTED NONE DETECTED   Tetrahydrocannabinol NONE DETECTED NONE DETECTED   Barbiturates NONE DETECTED NONE DETECTED  I-Stat beta hCG blood, ED  Result Value Ref Range   I-stat hCG, quantitative <5.0 <5 mIU/mL   Comment 3           CT Renal Stone Study  Result Date: 11/06/2020 CLINICAL DATA:  50 year old female with flank and abdominal pain. Nausea and vomiting following umbilical hernia repair in March. EXAM: CT ABDOMEN AND PELVIS WITHOUT CONTRAST TECHNIQUE: Multidetector CT imaging of the abdomen and pelvis was performed following the standard protocol without IV contrast. COMPARISON:  [facility] CT Abdomen and Pelvis 08/04/2020, [facility] CT Abdomen and Pelvis 07/18/2020, and earlier. FINDINGS: Lower chest: Cardiac size is at the upper limits of normal. Otherwise negative. No pericardial or pleural effusion. Hepatobiliary: Negative noncontrast liver and gallbladder. Pancreas: Negative. Spleen: Negative. Adrenals/Urinary Tract: Normal adrenal glands. Stable kidneys. No nephrolithiasis, hydronephrosis or hydroureter. Unremarkable urinary bladder. Stable right hemipelvis phlebolith. Stomach/Bowel: Hyperdense retained stool throughout the colon which may be stool mixed with prior oral contrast. Redundant  large bowel including the sigmoid colon. No large bowel inflammation. Normal appendix on series 3, image 65. Decompressed and negative terminal ileum. No dilated small bowel.  Decompressed stomach and duodenum. Interval postoperative changes to the umbilicus with a small broad-based area of fat containing hernia or rectus muscle diastasis on series 3, image 61, 4 x 5 x 2 cm. Small area of probable nodular fat necrosis along the superior margin of the hernia on series 3, image 57. But no inflammation within the underlying fat. Chronic ventral abdominal wall soft tissue stranding in the region has not significantly changed since last year. No fluid collection. No free air or free fluid. Vascular/Lymphatic: Normal caliber abdominal aorta. No calcified atherosclerosis. No lymphadenopathy. Reproductive: Stable and negative. Other: No pelvic free fluid. Musculoskeletal: Advanced lower thoracic facet  degeneration. No acute osseous abnormality identified. IMPRESSION: 1. Interval postoperative changes to the ventral abdominal wall with a new small 4 x 5 x 2 cm area of broad-based fat containing hernia versus rectus muscle diastasis (series 3, image 61). No incarceration, inflammation, or fluid collection. 2. Otherwise moderately increased retained stool throughout the large bowel raising the possibility of constipation. 3. No other acute or inflammatory process identified. Normal appendix. Electronically Signed   By: Genevie Ann M.D.   On: 11/06/2020 05:17    Procedures Procedures   Medications Ordered in ED Medications  sodium chloride 0.9 % bolus 500 mL (0 mLs Intravenous Stopped 11/06/20 0538)  ondansetron (ZOFRAN) injection 4 mg (4 mg Intravenous Given 11/06/20 0348)  sulfamethoxazole-trimethoprim (BACTRIM DS) 800-160 MG per tablet 1 tablet (1 tablet Oral Given 11/06/20 0538)  ketorolac (TORADOL) 30 MG/ML injection 30 mg (30 mg Intravenous Given 11/06/20 0538)  amLODipine (NORVASC) tablet 10 mg (10 mg Oral Given  11/06/20 1610)    ED Course  I have reviewed the triage vital signs and the nursing notes.  Pertinent labs & imaging results that were available during my care of the patient were reviewed by me and considered in my medical decision making (see chart for details).   537 am: case d/w Dr. Zenia Resides of surgery, who has reviewed the images and is comfortable with patient being discharged to home and following up with Dr. Kae Heller as an outpatient.    I do not believe the patient is septic at this time.  She has a normal specific gravity of urine and I do not believe she is critically dehydrated.    I have informed the patient and her family of CT and lab results.  Patient has been informed of ventral hernia contaning fat only which is not a surgical emergency as well as constipation and UTI.  I have updated the patient on the plan of care.  Patient had not taken today's BP medication so I have given a dose in the ED along with first antibiotics to ensure that she can tolerate her medications.  She was able to tolerate those medications without incident and is stable for discharge at this time.    We will treat for UTI with bactrim DS for 7 days. Plan to treat constipation with one capful of Miralax BID x 7 days then daily thereafter. Will treat nausea with zofran.  I have instructed the patient to call Monday to be seen by Dr. Kae Heller.  Follow up with your pain management specialist for ongoing pain.    Diana Henderson was evaluated in Emergency Department on 11/06/2020 for the symptoms described in the history of present illness. She was evaluated in the context of the global COVID-19 pandemic, which necessitated consideration that the patient might be at risk for infection with the SARS-CoV-2 virus that causes COVID-19. Institutional protocols and algorithms that pertain to the evaluation of patients at risk for COVID-19 are in a state of rapid change based on information released by regulatory bodies  including the CDC and federal and state organizations. These policies and algorithms were followed during the patient's care in the ED.  Final Clinical Impression(s) / ED Diagnoses  Return for intractable cough, coughing up blood, fevers >100.4 unrelieved by medication, shortness of breath, intractable vomiting, chest pain, shortness of breath, weakness, numbness, changes in speech, facial asymmetry, abdominal pain, passing out, Inability to tolerate liquids or food, cough, altered mental status or any concerns. No signs of systemic illness or infection.  The patient is nontoxic-appearing on exam and vital signs are within normal limits.  I have reviewed the triage vital signs and the nursing notes. Pertinent labs & imaging results that were available during my care of the patient were reviewed by me and considered in my medical decision making (see chart for details). After history, exam, and medical workup I feel the patient has been appropriately medically screened and is safe for discharge home. Pertinent diagnoses were discussed with the patient. Patient was given return precautions.         Daylee Delahoz, MD 11/06/20 [id number]

## 2020-11-06 NOTE — ED Notes (Signed)
Pt discharged and wheeled out of this ED in a wheelchair without difficulty.

## 2020-11-06 NOTE — Discharge Instructions (Addendum)
Miralax twice a day for 7 days then once daily thereafter

## 2020-11-07 DIAGNOSIS — E119 Type 2 diabetes mellitus without complications: Secondary | ICD-10-CM | POA: Diagnosis not present

## 2020-11-08 DIAGNOSIS — E119 Type 2 diabetes mellitus without complications: Secondary | ICD-10-CM | POA: Diagnosis not present

## 2020-11-08 DIAGNOSIS — G894 Chronic pain syndrome: Secondary | ICD-10-CM | POA: Diagnosis not present

## 2020-11-09 ENCOUNTER — Ambulatory Visit: Payer: Medicare HMO | Admitting: Plastic Surgery

## 2020-11-09 DIAGNOSIS — E119 Type 2 diabetes mellitus without complications: Secondary | ICD-10-CM | POA: Diagnosis not present

## 2020-11-10 ENCOUNTER — Other Ambulatory Visit: Payer: Self-pay

## 2020-11-10 ENCOUNTER — Ambulatory Visit (INDEPENDENT_AMBULATORY_CARE_PROVIDER_SITE_OTHER): Payer: Medicare HMO | Admitting: Plastic Surgery

## 2020-11-10 DIAGNOSIS — G479 Sleep disorder, unspecified: Secondary | ICD-10-CM | POA: Diagnosis not present

## 2020-11-10 DIAGNOSIS — M79642 Pain in left hand: Secondary | ICD-10-CM | POA: Diagnosis not present

## 2020-11-10 DIAGNOSIS — M25511 Pain in right shoulder: Secondary | ICD-10-CM | POA: Diagnosis not present

## 2020-11-10 DIAGNOSIS — I10 Essential (primary) hypertension: Secondary | ICD-10-CM | POA: Diagnosis not present

## 2020-11-10 DIAGNOSIS — G894 Chronic pain syndrome: Secondary | ICD-10-CM | POA: Diagnosis not present

## 2020-11-10 DIAGNOSIS — M25561 Pain in right knee: Secondary | ICD-10-CM | POA: Diagnosis not present

## 2020-11-10 DIAGNOSIS — M25562 Pain in left knee: Secondary | ICD-10-CM | POA: Diagnosis not present

## 2020-11-10 DIAGNOSIS — M545 Low back pain, unspecified: Secondary | ICD-10-CM | POA: Diagnosis not present

## 2020-11-10 DIAGNOSIS — M25512 Pain in left shoulder: Secondary | ICD-10-CM | POA: Diagnosis not present

## 2020-11-10 DIAGNOSIS — F1120 Opioid dependence, uncomplicated: Secondary | ICD-10-CM | POA: Diagnosis not present

## 2020-11-10 DIAGNOSIS — G8929 Other chronic pain: Secondary | ICD-10-CM | POA: Diagnosis not present

## 2020-11-10 DIAGNOSIS — N62 Hypertrophy of breast: Secondary | ICD-10-CM | POA: Diagnosis not present

## 2020-11-10 DIAGNOSIS — Z79891 Long term (current) use of opiate analgesic: Secondary | ICD-10-CM | POA: Diagnosis not present

## 2020-11-10 DIAGNOSIS — M79671 Pain in right foot: Secondary | ICD-10-CM | POA: Diagnosis not present

## 2020-11-10 DIAGNOSIS — E119 Type 2 diabetes mellitus without complications: Secondary | ICD-10-CM | POA: Diagnosis not present

## 2020-11-10 NOTE — Progress Notes (Signed)
Patient presents for reevaluation for breast reduction.  She still having the same symptoms.  Unfortunately her hemoglobin A1c is still too high at 9.9.  She is going to work on getting it down.  We have arranged a follow-up appointment in 6 months.  She knows that she is not a candidate for breast reduction until it is below 8.  All of her questions were answered.

## 2020-11-11 DIAGNOSIS — E119 Type 2 diabetes mellitus without complications: Secondary | ICD-10-CM | POA: Diagnosis not present

## 2020-11-12 DIAGNOSIS — E119 Type 2 diabetes mellitus without complications: Secondary | ICD-10-CM | POA: Diagnosis not present

## 2020-11-13 DIAGNOSIS — E119 Type 2 diabetes mellitus without complications: Secondary | ICD-10-CM | POA: Diagnosis not present

## 2020-11-14 DIAGNOSIS — E119 Type 2 diabetes mellitus without complications: Secondary | ICD-10-CM | POA: Diagnosis not present

## 2020-11-15 DIAGNOSIS — E119 Type 2 diabetes mellitus without complications: Secondary | ICD-10-CM | POA: Diagnosis not present

## 2020-11-16 DIAGNOSIS — J4540 Moderate persistent asthma, uncomplicated: Secondary | ICD-10-CM | POA: Diagnosis not present

## 2020-11-16 DIAGNOSIS — E1165 Type 2 diabetes mellitus with hyperglycemia: Secondary | ICD-10-CM | POA: Diagnosis not present

## 2020-11-16 DIAGNOSIS — Z794 Long term (current) use of insulin: Secondary | ICD-10-CM | POA: Diagnosis not present

## 2020-11-16 DIAGNOSIS — N62 Hypertrophy of breast: Secondary | ICD-10-CM | POA: Diagnosis not present

## 2020-11-16 DIAGNOSIS — E119 Type 2 diabetes mellitus without complications: Secondary | ICD-10-CM | POA: Diagnosis not present

## 2020-11-16 DIAGNOSIS — I119 Hypertensive heart disease without heart failure: Secondary | ICD-10-CM | POA: Diagnosis not present

## 2020-11-16 DIAGNOSIS — I10 Essential (primary) hypertension: Secondary | ICD-10-CM | POA: Diagnosis not present

## 2020-11-17 DIAGNOSIS — E119 Type 2 diabetes mellitus without complications: Secondary | ICD-10-CM | POA: Diagnosis not present

## 2020-11-18 DIAGNOSIS — E119 Type 2 diabetes mellitus without complications: Secondary | ICD-10-CM | POA: Diagnosis not present

## 2020-11-19 DIAGNOSIS — E119 Type 2 diabetes mellitus without complications: Secondary | ICD-10-CM | POA: Diagnosis not present

## 2020-11-20 DIAGNOSIS — E119 Type 2 diabetes mellitus without complications: Secondary | ICD-10-CM | POA: Diagnosis not present

## 2020-11-21 DIAGNOSIS — E119 Type 2 diabetes mellitus without complications: Secondary | ICD-10-CM | POA: Diagnosis not present

## 2020-11-22 DIAGNOSIS — E119 Type 2 diabetes mellitus without complications: Secondary | ICD-10-CM | POA: Diagnosis not present

## 2020-11-23 DIAGNOSIS — E119 Type 2 diabetes mellitus without complications: Secondary | ICD-10-CM | POA: Diagnosis not present

## 2020-11-24 DIAGNOSIS — E119 Type 2 diabetes mellitus without complications: Secondary | ICD-10-CM | POA: Diagnosis not present

## 2020-11-25 DIAGNOSIS — E119 Type 2 diabetes mellitus without complications: Secondary | ICD-10-CM | POA: Diagnosis not present

## 2020-11-26 DIAGNOSIS — E119 Type 2 diabetes mellitus without complications: Secondary | ICD-10-CM | POA: Diagnosis not present

## 2020-11-27 DIAGNOSIS — E119 Type 2 diabetes mellitus without complications: Secondary | ICD-10-CM | POA: Diagnosis not present

## 2020-11-28 DIAGNOSIS — E119 Type 2 diabetes mellitus without complications: Secondary | ICD-10-CM | POA: Diagnosis not present

## 2020-11-29 DIAGNOSIS — E119 Type 2 diabetes mellitus without complications: Secondary | ICD-10-CM | POA: Diagnosis not present

## 2020-11-30 DIAGNOSIS — E119 Type 2 diabetes mellitus without complications: Secondary | ICD-10-CM | POA: Diagnosis not present

## 2020-12-01 DIAGNOSIS — E119 Type 2 diabetes mellitus without complications: Secondary | ICD-10-CM | POA: Diagnosis not present

## 2020-12-02 DIAGNOSIS — E119 Type 2 diabetes mellitus without complications: Secondary | ICD-10-CM | POA: Diagnosis not present

## 2020-12-03 DIAGNOSIS — E119 Type 2 diabetes mellitus without complications: Secondary | ICD-10-CM | POA: Diagnosis not present

## 2020-12-04 DIAGNOSIS — E119 Type 2 diabetes mellitus without complications: Secondary | ICD-10-CM | POA: Diagnosis not present

## 2020-12-05 DIAGNOSIS — E119 Type 2 diabetes mellitus without complications: Secondary | ICD-10-CM | POA: Diagnosis not present

## 2020-12-06 DIAGNOSIS — E119 Type 2 diabetes mellitus without complications: Secondary | ICD-10-CM | POA: Diagnosis not present

## 2020-12-07 ENCOUNTER — Emergency Department (HOSPITAL_COMMUNITY)
Admission: EM | Admit: 2020-12-07 | Discharge: 2020-12-07 | Disposition: A | Discharge status: Home / Self Care | Payer: Medicare HMO | Attending: Emergency Medicine | Admitting: Emergency Medicine

## 2020-12-07 ENCOUNTER — Other Ambulatory Visit: Payer: Self-pay

## 2020-12-07 ENCOUNTER — Emergency Department (HOSPITAL_COMMUNITY): Payer: Medicare HMO

## 2020-12-07 DIAGNOSIS — Z981 Arthrodesis status: Secondary | ICD-10-CM | POA: Diagnosis not present

## 2020-12-07 DIAGNOSIS — R270 Ataxia, unspecified: Secondary | ICD-10-CM | POA: Diagnosis not present

## 2020-12-07 DIAGNOSIS — Z20822 Contact with and (suspected) exposure to covid-19: Secondary | ICD-10-CM | POA: Diagnosis not present

## 2020-12-07 DIAGNOSIS — Z87891 Personal history of nicotine dependence: Secondary | ICD-10-CM | POA: Diagnosis not present

## 2020-12-07 DIAGNOSIS — E1142 Type 2 diabetes mellitus with diabetic polyneuropathy: Secondary | ICD-10-CM | POA: Insufficient documentation

## 2020-12-07 DIAGNOSIS — R531 Weakness: Secondary | ICD-10-CM | POA: Diagnosis not present

## 2020-12-07 DIAGNOSIS — R202 Paresthesia of skin: Secondary | ICD-10-CM | POA: Insufficient documentation

## 2020-12-07 DIAGNOSIS — R29818 Other symptoms and signs involving the nervous system: Secondary | ICD-10-CM | POA: Diagnosis not present

## 2020-12-07 DIAGNOSIS — E119 Type 2 diabetes mellitus without complications: Secondary | ICD-10-CM | POA: Diagnosis not present

## 2020-12-07 DIAGNOSIS — R9082 White matter disease, unspecified: Secondary | ICD-10-CM | POA: Diagnosis not present

## 2020-12-07 DIAGNOSIS — Z91040 Latex allergy status: Secondary | ICD-10-CM | POA: Diagnosis not present

## 2020-12-07 DIAGNOSIS — M5021 Other cervical disc displacement,  high cervical region: Secondary | ICD-10-CM | POA: Diagnosis not present

## 2020-12-07 DIAGNOSIS — M47812 Spondylosis without myelopathy or radiculopathy, cervical region: Secondary | ICD-10-CM | POA: Diagnosis not present

## 2020-12-07 DIAGNOSIS — Z79899 Other long term (current) drug therapy: Secondary | ICD-10-CM | POA: Diagnosis not present

## 2020-12-07 DIAGNOSIS — J449 Chronic obstructive pulmonary disease, unspecified: Secondary | ICD-10-CM | POA: Diagnosis not present

## 2020-12-07 DIAGNOSIS — J45909 Unspecified asthma, uncomplicated: Secondary | ICD-10-CM | POA: Diagnosis not present

## 2020-12-07 DIAGNOSIS — I110 Hypertensive heart disease with heart failure: Secondary | ICD-10-CM | POA: Diagnosis not present

## 2020-12-07 DIAGNOSIS — I5032 Chronic diastolic (congestive) heart failure: Secondary | ICD-10-CM | POA: Diagnosis not present

## 2020-12-07 DIAGNOSIS — Z794 Long term (current) use of insulin: Secondary | ICD-10-CM | POA: Diagnosis not present

## 2020-12-07 DIAGNOSIS — M4802 Spinal stenosis, cervical region: Secondary | ICD-10-CM | POA: Diagnosis not present

## 2020-12-07 DIAGNOSIS — R29898 Other symptoms and signs involving the musculoskeletal system: Secondary | ICD-10-CM

## 2020-12-07 LAB — URINALYSIS, ROUTINE W REFLEX MICROSCOPIC
Bilirubin Urine: NEGATIVE
Glucose, UA: NEGATIVE mg/dL
Ketones, ur: 20 mg/dL — AB
Nitrite: NEGATIVE
Protein, ur: 30 mg/dL — AB
Specific Gravity, Urine: 1.016 (ref 1.005–1.030)
pH: 5 (ref 5.0–8.0)

## 2020-12-07 LAB — I-STAT CHEM 8, ED
BUN: 6 mg/dL (ref 6–20)
Calcium, Ion: 1.14 mmol/L — ABNORMAL LOW (ref 1.15–1.40)
Chloride: 101 mmol/L (ref 98–111)
Creatinine, Ser: 0.8 mg/dL (ref 0.44–1.00)
Glucose, Bld: 104 mg/dL — ABNORMAL HIGH (ref 70–99)
HCT: 43 % (ref 36.0–46.0)
Hemoglobin: 14.6 g/dL (ref 12.0–15.0)
Potassium: 3.7 mmol/L (ref 3.5–5.1)
Sodium: 138 mmol/L (ref 135–145)
TCO2: 27 mmol/L (ref 22–32)

## 2020-12-07 LAB — DIFFERENTIAL
Abs Immature Granulocytes: 0.01 10*3/uL (ref 0.00–0.07)
Basophils Absolute: 0 10*3/uL (ref 0.0–0.1)
Basophils Relative: 0 %
Eosinophils Absolute: 0.1 10*3/uL (ref 0.0–0.5)
Eosinophils Relative: 1 %
Immature Granulocytes: 0 %
Lymphocytes Relative: 31 %
Lymphs Abs: 1.7 10*3/uL (ref 0.7–4.0)
Monocytes Absolute: 0.4 10*3/uL (ref 0.1–1.0)
Monocytes Relative: 7 %
Neutro Abs: 3.4 10*3/uL (ref 1.7–7.7)
Neutrophils Relative %: 61 %

## 2020-12-07 LAB — CBC
HCT: 42.4 % (ref 36.0–46.0)
Hemoglobin: 13.5 g/dL (ref 12.0–15.0)
MCH: 26.6 pg (ref 26.0–34.0)
MCHC: 31.8 g/dL (ref 30.0–36.0)
MCV: 83.6 fL (ref 80.0–100.0)
Platelets: 217 10*3/uL (ref 150–400)
RBC: 5.07 MIL/uL (ref 3.87–5.11)
RDW: 14.6 % (ref 11.5–15.5)
WBC: 5.6 10*3/uL (ref 4.0–10.5)
nRBC: 0 % (ref 0.0–0.2)

## 2020-12-07 LAB — RESP PANEL BY RT-PCR (FLU A&B, COVID) ARPGX2
Influenza A by PCR: NEGATIVE
Influenza B by PCR: NEGATIVE
SARS Coronavirus 2 by RT PCR: NEGATIVE

## 2020-12-07 LAB — CBC WITH DIFFERENTIAL/PLATELET
Abs Immature Granulocytes: 0.01 10*3/uL (ref 0.00–0.07)
Basophils Absolute: 0 10*3/uL (ref 0.0–0.1)
Basophils Relative: 0 %
Eosinophils Absolute: 0 10*3/uL (ref 0.0–0.5)
Eosinophils Relative: 1 %
HCT: 39.7 % (ref 36.0–46.0)
Hemoglobin: 12.7 g/dL (ref 12.0–15.0)
Immature Granulocytes: 0 %
Lymphocytes Relative: 26 %
Lymphs Abs: 1.4 10*3/uL (ref 0.7–4.0)
MCH: 26.9 pg (ref 26.0–34.0)
MCHC: 32 g/dL (ref 30.0–36.0)
MCV: 84.1 fL (ref 80.0–100.0)
Monocytes Absolute: 0.3 10*3/uL (ref 0.1–1.0)
Monocytes Relative: 6 %
Neutro Abs: 3.7 10*3/uL (ref 1.7–7.7)
Neutrophils Relative %: 67 %
Platelets: 202 10*3/uL (ref 150–400)
RBC: 4.72 MIL/uL (ref 3.87–5.11)
RDW: 14.4 % (ref 11.5–15.5)
WBC: 5.5 10*3/uL (ref 4.0–10.5)
nRBC: 0 % (ref 0.0–0.2)

## 2020-12-07 LAB — PROTIME-INR
INR: 1 (ref 0.8–1.2)
Prothrombin Time: 13.3 seconds (ref 11.4–15.2)

## 2020-12-07 LAB — COMPREHENSIVE METABOLIC PANEL
ALT: 11 U/L (ref 0–44)
AST: 12 U/L — ABNORMAL LOW (ref 15–41)
Albumin: 3.7 g/dL (ref 3.5–5.0)
Alkaline Phosphatase: 64 U/L (ref 38–126)
Anion gap: 8 (ref 5–15)
BUN: 7 mg/dL (ref 6–20)
CO2: 26 mmol/L (ref 22–32)
Calcium: 9.1 mg/dL (ref 8.9–10.3)
Chloride: 103 mmol/L (ref 98–111)
Creatinine, Ser: 0.86 mg/dL (ref 0.44–1.00)
GFR, Estimated: >60 mL/min (ref >60)
Glucose, Bld: 151 mg/dL — ABNORMAL HIGH (ref 70–99)
Potassium: 3.5 mmol/L (ref 3.5–5.1)
Sodium: 137 mmol/L (ref 135–145)
Total Bilirubin: 0.8 mg/dL (ref 0.3–1.2)
Total Protein: 6.9 g/dL (ref 6.5–8.1)

## 2020-12-07 LAB — APTT: aPTT: 25 seconds (ref 24–36)

## 2020-12-07 MED ORDER — OXYCODONE HCL 5 MG PO TABS
15.0000 mg | ORAL_TABLET | Freq: Once | ORAL | Status: AC
  Administered 2020-12-07: 15 mg via ORAL
  Filled 2020-12-07: qty 3

## 2020-12-07 NOTE — Discharge Instructions (Addendum)
We saw in the ER for weakness and balance issues.  MRI of your brain and cervical spine did not reveal any stroke or any acute spinal cord disease.  As discussed, the MRI did indicate some changes to your blood vessels which your primary care doctor can look into by ordering advanced imaging.   As discussed, we would like you to see your primary care doctor as planned on Friday to see if they can further assess you on your balance problems.  They can consider physical therapy or sending you to a neurologist if needed.  We do recommend that you see the neurosurgeon -please call them for nearest appointment.

## 2020-12-07 NOTE — ED Triage Notes (Signed)
Pt complains of left leg pain for months. She has been getting progressively worse and cannot walk today d/t pain and dizziness.

## 2020-12-07 NOTE — ED Provider Notes (Signed)
[facility] DEPT Provider Note   CSN: [id number] Arrival date & time: 12/07/20  1435     History Chief Complaint  Patient presents with   Leg Pain    Diana Henderson is a 50 y.o. female.  HPI     50 year old female comes in a chief complaint of leg pain, balance issues, weakness.  Patient has history hypertension, hyperlipidemia, cervical spondylosis status post surgical repair.  She reports that she has been having left-sided weakness and balance issues for the last several months.  Her symptoms however have progressed over the last 2 or 3 weeks.  Today she had a hard time moving her left lower extremity, prompting her to come to the ER.  When she walks, she often has some balance issues and feels unsteady.  No near fainting or vertigo associated with that.  Patient's daughter is at the bedside.  She reports the same, stating that she feels more likely that patient's legs are giving out leading to balance issues and that patient is having generalized weakness after her abdominal surgery.  Pt has no associated urinary incontinence, urinary retention, bowel incontinence, pins and needle sensation in the perineal area.  She has abdominal pain but denies any focal back pain. No dizziness  at rest.   Review of system is positive for left-sided numbness.  Patient unsure when she was completely last normal, suspects that all of the symptoms have been present for at least a month, maybe longer.    Review of system is also positive for several pounds of weight loss for surgery.  Patient has reduced p.o. intake ever since the surgery.   Past Medical History:  Diagnosis Date   Anxiety    Arthritis    osteoarthritis    Asthma    has been eval. by Dr. Gwenette Greet- last 06/2014   Bipolar 1 disorder ([place])    Complication of anesthesia    09/13/14: malignant HTN with inducction, case cx but no sourse identified; 11/05/14 remifentanyl infusion used to attenuate  hypergynamic induction   Depression    Diabetes mellitus    GERD (gastroesophageal reflux disease)    Hyperlipidemia    Hypertension    Knee pain, chronic    OSA on CPAP    Pain in joint, ankle and foot 02/17/2013   Pneumonia    hosp. for a couple of day- not sure when it was    PTSD (post-traumatic stress disorder)    Rotator cuff tear    bicep tendon    Shortness of breath dyspnea    Sleep apnea    uses CPAP intermittently, pt. doesn't remember where she had the study    Patient Active Problem List   Diagnosis Date Noted   Chronic obstructive pulmonary disease ([place]) 09/15/2020   Deep venous thrombosis of peroneal vein ([place]) 09/15/2020   Dysphagia 09/15/2020   Excessive and frequent menstruation 09/15/2020   Gastro-esophageal reflux disease without esophagitis 09/15/2020   Hyperglycemia due to type 2 diabetes mellitus ([place]) 09/15/2020   Hypertensive heart disease without congestive heart failure 09/15/2020   Long term (current) use of insulin ([place]) 09/15/2020   Moderate persistent asthma, uncomplicated 40/98/1191   Primary osteoarthritis, unspecified ankle and foot 09/15/2020   Diabetic polyneuropathy associated with type 2 diabetes mellitus ([place]) 08/25/2019   Plantar fasciitis, left 08/25/2019   Tendonitis of ankle or foot 08/25/2019   Tight heel cords, acquired, bilateral 08/25/2019   Dyslipidemia 10/22/2016   Left rotator cuff tear 07/31/2016  Diabetes mellitus, insulin dependent (IDDM), uncontrolled 09/18/2015   AKI (acute kidney injury) ([place]) 09/18/2015   Morbid obesity ([place]) 09/18/2015   Asthma 09/18/2015   Uncontrolled type 2 diabetes mellitus with complication (HCC)    Chronic diastolic CHF (congestive heart failure), NYHA class 1 (HCC)    PTSD (post-traumatic stress disorder)    Bipolar I disorder ([place])    Anxiety state    Knee pain, chronic    Cervical spondylosis with myelopathy 11/05/2014   HTN (hypertension), malignant 09/08/2014   HNP (herniated nucleus  pulposus) with myelopathy, cervical 06/29/2014   Deformity of metatarsal bone of right foot 06/28/2014   CTS (carpal tunnel syndrome) 05/04/2014   OSA (obstructive sleep apnea) 05/04/2014   Hereditary and idiopathic peripheral neuropathy 05/04/2014   Ganglion cyst 03/10/2014   Vision loss, left eye 02/18/2014   Conjunctivitis 02/17/2014   Asthma exacerbation 01/13/2014   Abdominal pain, unspecified site 10/28/2013   Injury of foot, right, superficial 10/23/2013   Tenosynovitis of foot 08/17/2013   Pain in joint, ankle and foot 02/17/2013   Edema of foot 02/17/2013   Status post foot surgery 09/09/2012   Hyperlipidemia    Major depressive disorder, recurrent, 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    Past Surgical History:  Procedure Laterality Date   ANTERIOR CERVICAL DECOMP/DISCECTOMY FUSION N/A 11/05/2014   Procedure: ANTERIOR CERVICAL DECOMPRESSION/DISCECTOMY FUSION 1 LEVEL;  Surgeon: Consuella Lose, MD;  Location: [facility];  Service: Neurosurgery;  Laterality: N/A;  Cervical five- cervical seven anterior cervical decompression with fusion interbody prosthesis plating and bonegraft   CARPAL TUNNEL RELEASE Left 2001   Left wrist   CERVICAL LAMINECTOMY  03/31/20176   CERVICAL 4 SEVEN LAMINECTOMIES WITH LATERAL FUSION   [place] Osteotomy w/ Bone Graft Right 09/30/2014   Rt foot @ [facility]   GASTROC RECESSION EXTREMITY Right 3//20/14   Right foot   HERNIA REPAIR     1996   LAPAROSCOPY N/A 08/23/2020   Procedure: LAPAROSCOPIC LYSIS OF ADHESIONS AND OPEN REMOVAL OF ABDOMINAL WALL MESH;  Surgeon: Clovis Riley, MD;  Location: [facility];  Service: General;  Laterality: N/A;  90   Lapidus fusion Right 01/10/12   Rt foot   MIDTARSAL ARTHRODESIS Right 07/17/12   Rt foot   POSTERIOR CERVICAL FUSION/FORAMINOTOMY N/A 09/16/2015   Procedure: Cervical four - seven laminectomies with lateral mass fusion;  Surgeon: Consuella Lose, MD;  Location: [facility];  Service: Neurosurgery;   Laterality: N/A;  C4-7 laminectomy with lateral mass fusion   Remove implant deep Right 07/17/12   Rt foot   Remove Implant Deep Right 07/22/2014   Rt foot @ [facility]   SHOULDER ARTHROSCOPY WITH ROTATOR CUFF REPAIR AND SUBACROMIAL DECOMPRESSION Left 07/31/2016   Procedure: LEFT SHOULDER ARTHROSCOPY WITH ROTATOR CUFF REPAIR;  Surgeon: Renette Butters, MD;  Location: [facility];  Service: Orthopedics;  Laterality: Left;   TUBAL LIGATION       OB History   No obstetric history on file.     Family History  Problem Relation Age of Onset   Kidney disease Mother    Heart attack Father     Social History   Tobacco Use   Smoking status: Former    Packs/day: 2.00    Years: 6.00    Pack years: 12.00    Types: Cigarettes    Quit date: 06/18/1978    Years since quitting: 42.5   Smokeless tobacco: Never  Vaping Use   Vaping Use: Never used  Substance Use Topics  Alcohol use: Yes    Alcohol/week: 0.0 standard drinks    Comment: weekend- use of beer    Drug use: No    Home Medications Prior to Admission medications   Medication Sig Start Date End Date Taking? Authorizing Provider  amLODipine (NORVASC) 10 MG tablet Take 10 mg by mouth daily.  01/20/16  Yes [provider]  atenolol-chlorthalidone (TENORETIC) 100-25 MG tablet Take 1 tablet by mouth daily.   Yes [provider]  bisacodyl (DULCOLAX) 10 MG suppository Place 10 mg rectally daily as needed for mild constipation. 09/09/20  Yes [provider]  cyclobenzaprine (FLEXERIL) 10 MG tablet Take 10 mg by mouth every 12 (twelve) hours as needed for muscle spasms. 07/30/19  Yes [provider]  diazepam (VALIUM) 2 MG tablet Take 1 tablet (2 mg total) by mouth every 6 (six) hours as needed for muscle spasms. 08/18/18  Yes Nita Sells, MD  DULoxetine (CYMBALTA) 30 MG capsule Take 30 mg by mouth daily.   Yes [provider]  hydrOXYzine (ATARAX/VISTARIL) 25 MG tablet Take 25 mg by mouth at bedtime.  07/31/19  Yes [provider]  LEVEMIR FLEXTOUCH 100 UNIT/ML FlexPen Inject 10 Units into the skin 2 (two) times daily. 06/27/20  Yes [provider]  mirtazapine (REMERON) 15 MG tablet Take 15 mg by mouth at bedtime. 08/11/18  Yes [provider]  oxyCODONE (ROXICODONE) 15 MG immediate release tablet Take 15 mg by mouth every 6 (six) hours as needed for pain. 07/30/19  Yes [provider]  VENTOLIN HFA 108 (90 Base) MCG/ACT inhaler INHALE 1 TO 2 PUFFS INTO THE LUNGS EVERY 6 (SIX) HOURS AS NEEDED FOR WHEEZING OR SHORTNESS OF BREATH. Patient taking differently: Inhale 1-2 puffs into the lungs every 6 (six) hours as needed for wheezing or shortness of breath. 10/11/16  Yes Rigoberto Noel, MD  VICTOZA 18 MG/3ML SOPN Inject 0.6 mg into the skin daily. 07/01/20  Yes [provider]  BD PEN NEEDLE NANO 2ND GEN 32G X 4 MM MISC See admin instructions. 06/27/20   [provider]  budesonide-formoterol (SYMBICORT) 160-4.5 MCG/ACT inhaler Inhale 2 puffs into the lungs 2 (two) times daily. Patient taking differently: Inhale 2 puffs into the lungs 2 (two) times daily as needed (wheezing). 05/24/16   Parrett, Fonnie Mu, NP  capsaicin (ZOSTRIX) 0.025 % cream Apply topically 2 (two) times daily. Patient not taking: Reported on 12/07/2020 07/26/20   Charlesetta Shanks, MD  glucose blood (ACCU-CHEK AVIVA PLUS) test strip TEST FOUR TIMES DAILY 07/31/19   [provider]  ondansetron (ZOFRAN ODT) 8 MG disintegrating tablet 8mg  ODT q8 hours prn nausea and vomiting Patient not taking: No sig reported 11/06/20   Palumbo, April, MD    Allergies    Celery (apium graveolens var. dulce) skin test, Celery oil, Pork-derived products, Latex, Other, Penicillins, and Tape  Review of Systems   Review of Systems  Constitutional:  Positive for activity change.  Gastrointestinal:  Positive for abdominal pain.  Musculoskeletal:  Negative for neck pain.  Allergic/Immunologic: Negative  for immunocompromised state.  Neurological:  Positive for weakness and numbness.  Hematological:  Does not bruise/bleed easily.  All other systems reviewed and are negative.  Physical Exam Updated Vital Signs BP (!) 165/95 (BP Location: Left Arm)   Pulse 80   Temp 98.3 F (36.8 C) (Oral)   Resp 15   SpO2 100%   Physical Exam Vitals and nursing note reviewed.  Constitutional:  Appearance: She is well-developed.  HENT:     Head: Atraumatic.  Cardiovascular:     Rate and Rhythm: Normal rate.  Pulmonary:     Effort: Pulmonary effort is normal.  Musculoskeletal:     Cervical back: Normal range of motion and neck supple.  Skin:    General: Skin is warm and dry.  Neurological:     Mental Status: She is alert and oriented to person, place, and time.     Cranial Nerves: No cranial nerve deficit.     Comments: Left lower extremity strength is 2 out of 5 Left upper and lower extremity numbness  Difficult to ascertain patellar reflex bilaterally  No nystagmus Cerebellar exam did not reveal dysmetria    ED Results / Procedures / Treatments   Labs (all labs ordered are listed, but only abnormal results are displayed) Labs Reviewed  COMPREHENSIVE METABOLIC PANEL - Abnormal; Notable for the following components:      Result Value   Glucose, Bld 151 (*)    AST 12 (*)    All other components within normal limits  URINALYSIS, ROUTINE W REFLEX MICROSCOPIC - Abnormal; Notable for the following components:   Color, Urine AMBER (*)    APPearance CLOUDY (*)    Hgb urine dipstick SMALL (*)    Ketones, ur 20 (*)    Protein, ur 30 (*)    Leukocytes,Ua SMALL (*)    Bacteria, UA MANY (*)    All other components within normal limits  I-STAT CHEM 8, ED - Abnormal; Notable for the following components:   Glucose, Bld 104 (*)    Calcium, Ion 1.14 (*)    All other components within normal limits  RESP PANEL BY RT-PCR (FLU A&B, COVID) ARPGX2  CBC WITH DIFFERENTIAL/PLATELET   PROTIME-INR  APTT  CBC  DIFFERENTIAL    EKG EKG Interpretation  Date/Time:  Wednesday December 07 2020 20:17:23 EDT Ventricular Rate:  92 PR Interval:  135 QRS Duration: 81 QT Interval:  380 QTC Calculation: 471 R Axis:   93 Text Interpretation: Sinus rhythm Borderline right axis deviation No acute changes No significant change since last tracing Confirmed by Varney Biles ([id number]) on 12/07/2020 8:28:16 PM  Radiology CT Head Wo Contrast  Result Date: 12/07/2020 CLINICAL DATA:  50 year old female with numbness and tingling of the left side. EXAM: CT HEAD WITHOUT CONTRAST TECHNIQUE: Contiguous axial images were obtained from the base of the skull through the vertex without intravenous contrast. COMPARISON:  Head CT dated 01/05/2017. FINDINGS: Brain: The ventricles and sulci appropriate size for patient's age. The gray-white matter discrimination is preserved. There is no acute intracranial hemorrhage. No mass effect midline shift no extra-axial fluid collection. Vascular: No hyperdense vessel or unexpected calcification. Skull: Normal. Negative for fracture or focal lesion. Sinuses/Orbits: No acute finding. Other: None IMPRESSION: Unremarkable noncontrast CT of the brain. Electronically Signed   By: Anner Crete M.D.   On: 12/07/2020 16:07   MR BRAIN WO CONTRAST  Result Date: 12/07/2020 CLINICAL DATA:  Initial evaluation for neuro deficit, stroke suspected, cervical radiculopathy. EXAM: MRI HEAD WITHOUT CONTRAST MRI CERVICAL SPINE WITHOUT CONTRAST TECHNIQUE: Multiplanar, multiecho pulse sequences of the brain and surrounding structures, and cervical spine, to include the craniocervical junction and cervicothoracic junction, were obtained without intravenous contrast. COMPARISON:  Prior head CT from earlier the same day. FINDINGS: MRI HEAD FINDINGS Brain: Cerebral volume within normal limits for age. Few scattered foci of T2/FLAIR hyperintensity noted involving the periventricular and deep  white matter both  cerebral hemispheres, nonspecific, but most like related chronic microvascular ischemic disease, mild in nature. No abnormal foci of restricted diffusion to suggest acute or subacute ischemia. Gray-white matter differentiation maintained. No encephalomalacia to suggest chronic cortical infarction. No evidence for acute or chronic intracranial hemorrhage. No mass lesion, midline shift or mass effect. No hydrocephalus or extra-axial fluid collection. Pituitary gland suprasellar region within normal limits. Midline structures intact. Vascular: Major intracranial vascular flow voids are maintained. There is a clustered area of apparent vascular flow voids centered near the left middle cerebellar peduncle (series 8, images 8, 9), nonspecific, but could reflect a small vascular malformation. Finding similar to perhaps slightly more prominent as compared to previous exam. Skull and upper cervical spine: Craniocervical junction within normal limits. Bone marrow signal intensity normal. No scalp soft tissue abnormality. Sinuses/Orbits: Globes and orbital soft tissues demonstrate no acute finding. Paranasal sinuses are largely clear. No significant mastoid effusion. Inner ear structures grossly normal. Other: None. MRI CERVICAL SPINE FINDINGS Alignment: Straightening of the normal cervical lordosis. No listhesis. Vertebrae: Susceptibility artifact related to prior ACDF at C5-6 with solid arthrodesis. Prior posterior fusion at C4-C7. Vertebral body height maintained without acute or chronic fracture. Bone marrow signal intensity somewhat diffusely decreased on T1 weighted imaging, nonspecific, but most commonly related to anemia, smoking, or obesity. No discrete or worrisome osseous lesions. No abnormal marrow edema. Cord: Flattening with question of subtle cord signal changes involving the right hemi cord at the level of C5-6 (series 8, image 28). Signal intensity within the cord is otherwise within normal  limits. Posterior Fossa, vertebral arteries, paraspinal tissues: Craniocervical junction within normal limits. Paraspinous and prevertebral soft tissues demonstrate no acute finding. Normal flow voids seen within the vertebral arteries bilaterally. Disc levels: C2-C3: Unremarkable. C3-C4: Mild disc bulge with uncovertebral hypertrophy. Flattening and partial effacement of the ventral thecal sac without significant spinal stenosis or cord impingement. Mild left greater than right C4 foraminal stenosis related to disc bulge and uncovertebral hypertrophy. C4-C5: Right paracentral disc extrusion and/or endplate spurring indents the ventral thecal sac. Associated inferior migration posterior to the C5 and C6 vertebral bodies. No more than mild flattening of the right hemi cord at the level of C4-5. Prior posterior decompression with wide patency of the thecal sac. Foramina remain patent. C5-C6: Prior anterior and posterior fusion with posterior decompression. Right paracentral spurring seen posterior to the C5 and C6 vertebral bodies indents the right ventral thecal sac, contacting and flattening the right hemicord (series 8, image 27). Question associated subtle myelomalacia at the level of C5-6 (series 8, image 28). Thecal sac remains patent given the decompression. No significant foraminal stenosis. C6-C7: Negative interspace. Prior posterior decompression. No canal or foraminal stenosis. C7-T1: Negative interspace. Mild facet hypertrophy. No canal or foraminal stenosis. Visualized upper thoracic spine demonstrates no significant finding. IMPRESSION: MRI HEAD IMPRESSION: 1. No acute intracranial abnormality. 2. Mild cerebral white matter disease, nonspecific, but most likely related to mild chronic microvascular ischemic disease. 3. Small 11 mm cluster of apparent vascular flow voids centered at the left middle cerebellar peduncle, nonspecific, but could reflect a small vascular malformation/AVM. Finding is similar to  perhaps slightly more prominent as compared to previous MRI from 2017. Finding could be further assessed with dedicated vascular imaging of the brain as clinically warranted. MRI CERVICAL SPINE IMPRESSION: 1. Postoperative changes including prior ACDF at C5-6 with posterior fusion and decompression at C4-C7. 2. Right disc extrusion and/or osseous spurring at the C5-C6 level, contacting and flattening the right  hemicord. Question associated subtle myelomalacia at this level. Overall, appearance is similar to previous. 3. Additional mild spondylosis elsewhere within the cervical spine as above. No other significant stenosis or impingement. Electronically Signed   By: Jeannine Boga M.D.   On: 12/07/2020 22:47   MR Cervical Spine Wo Contrast  Result Date: 12/07/2020 CLINICAL DATA:  Initial evaluation for neuro deficit, stroke suspected, cervical radiculopathy. EXAM: MRI HEAD WITHOUT CONTRAST MRI CERVICAL SPINE WITHOUT CONTRAST TECHNIQUE: Multiplanar, multiecho pulse sequences of the brain and surrounding structures, and cervical spine, to include the craniocervical junction and cervicothoracic junction, were obtained without intravenous contrast. COMPARISON:  Prior head CT from earlier the same day. FINDINGS: MRI HEAD FINDINGS Brain: Cerebral volume within normal limits for age. Few scattered foci of T2/FLAIR hyperintensity noted involving the periventricular and deep white matter both cerebral hemispheres, nonspecific, but most like related chronic microvascular ischemic disease, mild in nature. No abnormal foci of restricted diffusion to suggest acute or subacute ischemia. Gray-white matter differentiation maintained. No encephalomalacia to suggest chronic cortical infarction. No evidence for acute or chronic intracranial hemorrhage. No mass lesion, midline shift or mass effect. No hydrocephalus or extra-axial fluid collection. Pituitary gland suprasellar region within normal limits. Midline structures  intact. Vascular: Major intracranial vascular flow voids are maintained. There is a clustered area of apparent vascular flow voids centered near the left middle cerebellar peduncle (series 8, images 8, 9), nonspecific, but could reflect a small vascular malformation. Finding similar to perhaps slightly more prominent as compared to previous exam. Skull and upper cervical spine: Craniocervical junction within normal limits. Bone marrow signal intensity normal. No scalp soft tissue abnormality. Sinuses/Orbits: Globes and orbital soft tissues demonstrate no acute finding. Paranasal sinuses are largely clear. No significant mastoid effusion. Inner ear structures grossly normal. Other: None. MRI CERVICAL SPINE FINDINGS Alignment: Straightening of the normal cervical lordosis. No listhesis. Vertebrae: Susceptibility artifact related to prior ACDF at C5-6 with solid arthrodesis. Prior posterior fusion at C4-C7. Vertebral body height maintained without acute or chronic fracture. Bone marrow signal intensity somewhat diffusely decreased on T1 weighted imaging, nonspecific, but most commonly related to anemia, smoking, or obesity. No discrete or worrisome osseous lesions. No abnormal marrow edema. Cord: Flattening with question of subtle cord signal changes involving the right hemi cord at the level of C5-6 (series 8, image 28). Signal intensity within the cord is otherwise within normal limits. Posterior Fossa, vertebral arteries, paraspinal tissues: Craniocervical junction within normal limits. Paraspinous and prevertebral soft tissues demonstrate no acute finding. Normal flow voids seen within the vertebral arteries bilaterally. Disc levels: C2-C3: Unremarkable. C3-C4: Mild disc bulge with uncovertebral hypertrophy. Flattening and partial effacement of the ventral thecal sac without significant spinal stenosis or cord impingement. Mild left greater than right C4 foraminal stenosis related to disc bulge and uncovertebral  hypertrophy. C4-C5: Right paracentral disc extrusion and/or endplate spurring indents the ventral thecal sac. Associated inferior migration posterior to the C5 and C6 vertebral bodies. No more than mild flattening of the right hemi cord at the level of C4-5. Prior posterior decompression with wide patency of the thecal sac. Foramina remain patent. C5-C6: Prior anterior and posterior fusion with posterior decompression. Right paracentral spurring seen posterior to the C5 and C6 vertebral bodies indents the right ventral thecal sac, contacting and flattening the right hemicord (series 8, image 27). Question associated subtle myelomalacia at the level of C5-6 (series 8, image 28). Thecal sac remains patent given the decompression. No significant foraminal stenosis. C6-C7: Negative interspace. Prior  posterior decompression. No canal or foraminal stenosis. C7-T1: Negative interspace. Mild facet hypertrophy. No canal or foraminal stenosis. Visualized upper thoracic spine demonstrates no significant finding. IMPRESSION: MRI HEAD IMPRESSION: 1. No acute intracranial abnormality. 2. Mild cerebral white matter disease, nonspecific, but most likely related to mild chronic microvascular ischemic disease. 3. Small 11 mm cluster of apparent vascular flow voids centered at the left middle cerebellar peduncle, nonspecific, but could reflect a small vascular malformation/AVM. Finding is similar to perhaps slightly more prominent as compared to previous MRI from 2017. Finding could be further assessed with dedicated vascular imaging of the brain as clinically warranted. MRI CERVICAL SPINE IMPRESSION: 1. Postoperative changes including prior ACDF at C5-6 with posterior fusion and decompression at C4-C7. 2. Right disc extrusion and/or osseous spurring at the C5-C6 level, contacting and flattening the right hemicord. Question associated subtle myelomalacia at this level. Overall, appearance is similar to previous. 3. Additional mild  spondylosis elsewhere within the cervical spine as above. No other significant stenosis or impingement. Electronically Signed   By: Jeannine Boga M.D.   On: 12/07/2020 22:47    Procedures Procedures   Medications Ordered in ED Medications  oxyCODONE (Oxy IR/ROXICODONE) immediate release tablet 15 mg (15 mg Oral Given 12/07/20 2000)    ED Course  I have reviewed the triage vital signs and the nursing notes.  Pertinent labs & imaging results that were available during my care of the patient were reviewed by me and considered in my medical decision making (see chart for details).  Clinical Course as of 12/07/20 2340  Wed Dec 07, 2020  2336 Urinalysis, Routine w reflex microscopic(!) No uti symptoms  [AN]  2337 MR BRAIN WO CONTRAST MRI negative for acute stroke or any acute cervical spinal cord issues.  There is some right hemicord flattening, but the symptoms are left-sided, therefore that finding is unrelated clinically.  There is some vascular flow abnormality.  Again no stroke, but I did discuss this finding with the patient informing her that on Friday when she sees her PCP, they can look at the MRI findings and order appropriate carotid imaging.  Patient agrees with that. [AN]  2338 Discussed the MRI findings again with the patient.  Daughter at the bedside.  Patient is able to ambulate according to her daughter, just had some intermittent episodes of balance issues.  There is a PCP appointment coming up on Friday.  They were concerned about neurosurgical issues, I do not think it is unreasonable to see the neurosurgeon since the MRI cervical spine was done here.  The neurosurgeon can also see if other part of the spinal cord needs to be looked at.  Neurosurgery follow-up will be given.  Discussed with the patient the importance of PCP follow-up.  They need vascular imaging of the neck, also their PCP can assess and see if patient will benefit with PT and or neurology follow-up.   They can also do further work-up to ensure there is no significant vitamin deficiency etc.  Stable for discharge from ED perspective.  Nursing staff to ensure patient is able to ambulate before she leaves. [AN]    Clinical Course User Index [AN] Varney Biles, MD   MDM Rules/Calculators/A&P                          50 year old comes in with chief complaint of left-sided weakness, balance issues, dizziness and left-sided numbness.  Nonspecific symptoms, but there is some focal weakness  in the left lower extremity with subjective left-sided numbness.   Patient also reports some dizziness and ataxic gait.  Stroke in the differential diagnosis.  We will get MRI brain without contrast.  Additionally, patient also has had history of cervical spine surgery, will also look at the the spine with the MRI to ensure there is no cord compression issue or significant herniation.  The other possibilities include metabolic issues -especially given that patient has significant weight loss and poor nutrition.  Thyroid disease possible as well.  Will reassess after the MRI.    Final Clinical Impression(s) / ED Diagnoses Final diagnoses:  Left leg weakness  Ataxia    Rx / DC Orders ED Discharge Orders     None        Varney Biles, MD 12/07/20 2340

## 2020-12-07 NOTE — ED Notes (Signed)
Patient requesting pain medication, Dr. Kathrynn Humble at bedside.

## 2020-12-07 NOTE — ED Notes (Addendum)
Patient transported to MRI 

## 2020-12-07 NOTE — ED Provider Notes (Signed)
Emergency Medicine Provider Triage Evaluation Note  Diana Henderson 50 y.o. female was evaluated in triage.  Pt complains of pain, numbness to her entire left side that is been ongoing for several months.  She states that has been intermittently occurring since she had surgery on her abdomen on 08/26/2020.  She states she has not been able to see her doctor for this yet.  She states that she feels like it is slowly progressed and feels like she is off balance and has trouble walking due to the pain and tingling sensation in her arm and her leg on the left side.  She denies any chest pain.  Symptoms she has vomiting.  She has not had any difficulty breathing.   Review of Systems  Positive: Numbness, weakness Negative: Chest pain, fever, vomiting  Physical Exam  BP 134/82   Pulse 70   Temp 98.2 F (36.8 C) (Oral)   Resp 18   Ht 5\' 4"  (1.626 m)   Wt 65.8 kg   SpO2 100%   BMI 24.89 kg/m  Gen:   Awake, no distress   HEENT:  Atraumatic  Resp:  Normal effort Cardiac:  Normal rate 2+ DP and radial pulses. Abd:   Nondistended, nontender  MSK:   Moves extremities Neuro:  Speech clear.  No slurred speech.  Equal grip strength bilaterally.  Slightly diminished strength noted to left upper and left lower extremity.  Patient reports sensation.  Other:     Medical Decision Making  Medically screening exam initiated at 2:49 PM  Appropriate orders placed.  Britley Wilmes was informed that the remainder of the evaluation will be completed by another provider, this initial triage assessment does not replace that evaluation. They are counseled that they will need to remain in the ED until the completion of their workup, including full H&P and results of any tests.  Risks of leaving the emergency department prior to completion of treatment were discussed. Patient was advised to inform ED staff if they are leaving before their treatment is complete. The patient acknowledged these risks and time was  allowed for questions.     The patient appears stable so that the remainder of the MSE may be completed by another provider.  Given that her symptoms been ongoing for several months, she does not meet criteria for acute code stroke.  Clinical Impression  Numbness.   Portions of this note were generated with [employer]. Dictation errors may occur despite best attempts at proofreading.     Volanda Napoleon, PA-C 12/07/20 1451    Truddie Hidden, MD 12/07/20 [phone]

## 2020-12-07 NOTE — ED Notes (Signed)
Patient back from MRI.

## 2020-12-08 ENCOUNTER — Other Ambulatory Visit: Payer: Self-pay

## 2020-12-08 ENCOUNTER — Emergency Department (HOSPITAL_COMMUNITY): Payer: Medicare HMO

## 2020-12-08 ENCOUNTER — Emergency Department (HOSPITAL_COMMUNITY)
Admission: EM | Admit: 2020-12-08 | Discharge: 2020-12-08 | Disposition: A | Discharge status: Home / Self Care | Payer: Medicare HMO | Attending: Emergency Medicine | Admitting: Emergency Medicine

## 2020-12-08 ENCOUNTER — Encounter (HOSPITAL_COMMUNITY): Payer: Self-pay | Admitting: Emergency Medicine

## 2020-12-08 DIAGNOSIS — Z7951 Long term (current) use of inhaled steroids: Secondary | ICD-10-CM | POA: Diagnosis not present

## 2020-12-08 DIAGNOSIS — K219 Gastro-esophageal reflux disease without esophagitis: Secondary | ICD-10-CM | POA: Diagnosis not present

## 2020-12-08 DIAGNOSIS — M25551 Pain in right hip: Secondary | ICD-10-CM | POA: Diagnosis not present

## 2020-12-08 DIAGNOSIS — I5032 Chronic diastolic (congestive) heart failure: Secondary | ICD-10-CM | POA: Diagnosis not present

## 2020-12-08 DIAGNOSIS — R1084 Generalized abdominal pain: Secondary | ICD-10-CM

## 2020-12-08 DIAGNOSIS — M79641 Pain in right hand: Secondary | ICD-10-CM | POA: Diagnosis not present

## 2020-12-08 DIAGNOSIS — Z794 Long term (current) use of insulin: Secondary | ICD-10-CM | POA: Insufficient documentation

## 2020-12-08 DIAGNOSIS — R110 Nausea: Secondary | ICD-10-CM | POA: Diagnosis not present

## 2020-12-08 DIAGNOSIS — E119 Type 2 diabetes mellitus without complications: Secondary | ICD-10-CM | POA: Diagnosis not present

## 2020-12-08 DIAGNOSIS — Z79899 Other long term (current) drug therapy: Secondary | ICD-10-CM | POA: Insufficient documentation

## 2020-12-08 DIAGNOSIS — R140 Abdominal distension (gaseous): Secondary | ICD-10-CM | POA: Diagnosis not present

## 2020-12-08 DIAGNOSIS — I110 Hypertensive heart disease with heart failure: Secondary | ICD-10-CM | POA: Diagnosis not present

## 2020-12-08 DIAGNOSIS — M25562 Pain in left knee: Secondary | ICD-10-CM | POA: Diagnosis not present

## 2020-12-08 DIAGNOSIS — E1142 Type 2 diabetes mellitus with diabetic polyneuropathy: Secondary | ICD-10-CM | POA: Insufficient documentation

## 2020-12-08 DIAGNOSIS — J4540 Moderate persistent asthma, uncomplicated: Secondary | ICD-10-CM | POA: Diagnosis not present

## 2020-12-08 DIAGNOSIS — M79642 Pain in left hand: Secondary | ICD-10-CM | POA: Diagnosis not present

## 2020-12-08 DIAGNOSIS — G894 Chronic pain syndrome: Secondary | ICD-10-CM | POA: Diagnosis not present

## 2020-12-08 DIAGNOSIS — R112 Nausea with vomiting, unspecified: Secondary | ICD-10-CM | POA: Diagnosis not present

## 2020-12-08 DIAGNOSIS — R1111 Vomiting without nausea: Secondary | ICD-10-CM | POA: Diagnosis not present

## 2020-12-08 DIAGNOSIS — Z87891 Personal history of nicotine dependence: Secondary | ICD-10-CM | POA: Insufficient documentation

## 2020-12-08 DIAGNOSIS — G479 Sleep disorder, unspecified: Secondary | ICD-10-CM | POA: Diagnosis not present

## 2020-12-08 DIAGNOSIS — K439 Ventral hernia without obstruction or gangrene: Secondary | ICD-10-CM | POA: Diagnosis not present

## 2020-12-08 DIAGNOSIS — I10 Essential (primary) hypertension: Secondary | ICD-10-CM | POA: Diagnosis not present

## 2020-12-08 DIAGNOSIS — M25512 Pain in left shoulder: Secondary | ICD-10-CM | POA: Diagnosis not present

## 2020-12-08 DIAGNOSIS — Z91040 Latex allergy status: Secondary | ICD-10-CM | POA: Diagnosis not present

## 2020-12-08 DIAGNOSIS — K5900 Constipation, unspecified: Secondary | ICD-10-CM | POA: Diagnosis not present

## 2020-12-08 DIAGNOSIS — M25561 Pain in right knee: Secondary | ICD-10-CM | POA: Diagnosis not present

## 2020-12-08 DIAGNOSIS — M545 Low back pain, unspecified: Secondary | ICD-10-CM | POA: Diagnosis not present

## 2020-12-08 LAB — CBC WITH DIFFERENTIAL/PLATELET
Abs Immature Granulocytes: 0.02 10*3/uL (ref 0.00–0.07)
Basophils Absolute: 0 10*3/uL (ref 0.0–0.1)
Basophils Relative: 0 %
Eosinophils Absolute: 0 10*3/uL (ref 0.0–0.5)
Eosinophils Relative: 1 %
HCT: 39.2 % (ref 36.0–46.0)
Hemoglobin: 12.4 g/dL (ref 12.0–15.0)
Immature Granulocytes: 0 %
Lymphocytes Relative: 27 %
Lymphs Abs: 1.4 10*3/uL (ref 0.7–4.0)
MCH: 26.7 pg (ref 26.0–34.0)
MCHC: 31.6 g/dL (ref 30.0–36.0)
MCV: 84.5 fL (ref 80.0–100.0)
Monocytes Absolute: 0.4 10*3/uL (ref 0.1–1.0)
Monocytes Relative: 7 %
Neutro Abs: 3.5 10*3/uL (ref 1.7–7.7)
Neutrophils Relative %: 65 %
Platelets: 204 10*3/uL (ref 150–400)
RBC: 4.64 MIL/uL (ref 3.87–5.11)
RDW: 14.6 % (ref 11.5–15.5)
WBC: 5.4 10*3/uL (ref 4.0–10.5)
nRBC: 0 % (ref 0.0–0.2)

## 2020-12-08 LAB — COMPREHENSIVE METABOLIC PANEL
ALT: 9 U/L (ref 0–44)
AST: 11 U/L — ABNORMAL LOW (ref 15–41)
Albumin: 3.3 g/dL — ABNORMAL LOW (ref 3.5–5.0)
Alkaline Phosphatase: 57 U/L (ref 38–126)
Anion gap: 10 (ref 5–15)
BUN: 9 mg/dL (ref 6–20)
CO2: 24 mmol/L (ref 22–32)
Calcium: 8.1 mg/dL — ABNORMAL LOW (ref 8.9–10.3)
Chloride: 104 mmol/L (ref 98–111)
Creatinine, Ser: 0.77 mg/dL (ref 0.44–1.00)
GFR, Estimated: >60 mL/min (ref >60)
Glucose, Bld: 97 mg/dL (ref 70–99)
Potassium: 3.4 mmol/L — ABNORMAL LOW (ref 3.5–5.1)
Sodium: 138 mmol/L (ref 135–145)
Total Bilirubin: 0.6 mg/dL (ref 0.3–1.2)
Total Protein: 6 g/dL — ABNORMAL LOW (ref 6.5–8.1)

## 2020-12-08 LAB — LIPASE, BLOOD: Lipase: 22 U/L (ref 11–51)

## 2020-12-08 MED ORDER — ONDANSETRON 4 MG PO TBDP: 4.0000 mg | ORAL_TABLET | Freq: Three times a day (TID) | ORAL | 0 refills | Status: AC | PRN

## 2020-12-08 MED ORDER — ONDANSETRON HCL 4 MG/2ML IJ SOLN
4.0000 mg | Freq: Once | INTRAMUSCULAR | Status: AC
  Administered 2020-12-08: 4 mg via INTRAVENOUS
  Filled 2020-12-08: qty 2

## 2020-12-08 MED ORDER — AMLODIPINE BESYLATE 5 MG PO TABS
10.0000 mg | ORAL_TABLET | Freq: Once | ORAL | Status: AC
  Administered 2020-12-08: 10 mg via ORAL
  Filled 2020-12-08: qty 2

## 2020-12-08 MED ORDER — SODIUM CHLORIDE 0.9 % IV BOLUS
1000.0000 mL | Freq: Once | INTRAVENOUS | Status: AC
  Administered 2020-12-08: 1000 mL via INTRAVENOUS

## 2020-12-08 MED ORDER — OXYCODONE HCL 5 MG PO TABS
15.0000 mg | ORAL_TABLET | Freq: Once | ORAL | Status: AC
Start: 2020-12-08 — End: 2020-12-08
  Administered 2020-12-08: 15 mg via ORAL
  Filled 2020-12-08: qty 3

## 2020-12-08 NOTE — ED Provider Notes (Signed)
Emergency Medicine Provider Triage Evaluation Note  Diana Henderson , a 50 y.o. female  was evaluated in triage.  Pt complains of n/v.  Review of Systems  Positive: Nausea, vomiting, back pain, L sided weakness, abd pain Negative: Fever, cough, dysuria, new numbness  Physical Exam  BP (!) 154/95 (BP Location: Right Arm)   Pulse (!) 107   Temp 98.9 F (37.2 C) (Oral)   Resp 18   SpO2 100%  Gen:   Awake, no distress   Resp:  Normal effort  MSK:   Moves extremities without difficulty  Other:    Medical Decision Making  Medically screening exam initiated at 5:41 PM.  Appropriate orders placed.  Diana Henderson was informed that the remainder of the evaluation will be completed by another provider, this initial triage assessment does not replace that evaluation, and the importance of remaining in the ED until their evaluation is complete.  Pt report having recurrent abd pain with n/v since march of this year after abdominal surgery.  Report having normal bowel movements.  Sts sxs worsening, unable to keep her medication down.  Sts she was seen in the ER yesterday for similar complaint along with ongoing l side weakness.  Subsequently d/c home after work up without acute finding.  Saw her pain specialist today but was sent to the ER for further management of her ongoing complaints.    Domenic Moras, PA-C 12/08/20 1745    Horton, Alvin Critchley, DO 12/08/20 2224

## 2020-12-08 NOTE — ED Provider Notes (Signed)
[facility] DEPT Provider Note   CSN: [id number] Arrival date & time: 12/08/20  1713     History Chief Complaint  Patient presents with   Emesis    Diana Henderson is a 50 y.o. female.  50 year old female with history of multiple prior surgeries (feet, hernia, shoulder) seen by pain management today where she had an episode of vomiting in the office and was sent to the ER. Patient reports vomiting x 3 months since her last surgery and unable to keep any solids or liquids whatsoever down for the past 3 months. Patient tried unknown medication for vomiting but was unable to keep the tablet down. Patient was seen in the ER yesterday with complaint of left side weakness, ongoing for several months, had CT and MRI brain and c-spine and advised to follow up with PCP/neurology but felt no emergent findings that warranted admission yesterday. Denies fevers, chills, changes in bowel or bladder habits sick contacts.       Past Medical History:  Diagnosis Date   Anxiety    Arthritis    osteoarthritis    Asthma    has been eval. by Dr. Gwenette Greet- last 06/2014   Bipolar 1 disorder ([place])    Complication of anesthesia    09/13/14: malignant HTN with inducction, case cx but no sourse identified; 11/05/14 remifentanyl infusion used to attenuate hypergynamic induction   Depression    Diabetes mellitus    GERD (gastroesophageal reflux disease)    Hyperlipidemia    Hypertension    Knee pain, chronic    OSA on CPAP    Pain in joint, ankle and foot 02/17/2013   Pneumonia    hosp. for a couple of day- not sure when it was    PTSD (post-traumatic stress disorder)    Rotator cuff tear    bicep tendon    Shortness of breath dyspnea    Sleep apnea    uses CPAP intermittently, pt. doesn't remember where she had the study    Patient Active Problem List   Diagnosis Date Noted   Chronic obstructive pulmonary disease ([place]) 09/15/2020   Deep venous thrombosis of peroneal  vein ([place]) 09/15/2020   Dysphagia 09/15/2020   Excessive and frequent menstruation 09/15/2020   Gastro-esophageal reflux disease without esophagitis 09/15/2020   Hyperglycemia due to type 2 diabetes mellitus ([place]) 09/15/2020   Hypertensive heart disease without congestive heart failure 09/15/2020   Long term (current) use of insulin ([place]) 09/15/2020   Moderate persistent asthma, uncomplicated 38/75/6433   Primary osteoarthritis, unspecified ankle and foot 09/15/2020   Diabetic polyneuropathy associated with type 2 diabetes mellitus ([place]) 08/25/2019   Plantar fasciitis, left 08/25/2019   Tendonitis of ankle or foot 08/25/2019   Tight heel cords, acquired, bilateral 08/25/2019   Dyslipidemia 10/22/2016   Left rotator cuff tear 07/31/2016   Diabetes mellitus, insulin dependent (IDDM), uncontrolled 09/18/2015   AKI (acute kidney injury) ([place]) 09/18/2015   Morbid obesity ([place]) 09/18/2015   Asthma 09/18/2015   Uncontrolled type 2 diabetes mellitus with complication (HCC)    Chronic diastolic CHF (congestive heart failure), NYHA class 1 (HCC)    PTSD (post-traumatic stress disorder)    Bipolar I disorder ([place])    Anxiety state    Knee pain, chronic    Cervical spondylosis with myelopathy 11/05/2014   HTN (hypertension), malignant 09/08/2014   HNP (herniated nucleus pulposus) with myelopathy, cervical 06/29/2014   Deformity of metatarsal bone of right foot 06/28/2014   CTS (carpal  tunnel syndrome) 05/04/2014   OSA (obstructive sleep apnea) 05/04/2014   Hereditary and idiopathic peripheral neuropathy 05/04/2014   Ganglion cyst 03/10/2014   Vision loss, left eye 02/18/2014   Conjunctivitis 02/17/2014   Asthma exacerbation 01/13/2014   Abdominal pain, unspecified site 10/28/2013   Injury of foot, right, superficial 10/23/2013   Tenosynovitis of foot 08/17/2013   Pain in joint, ankle and foot 02/17/2013   Edema of foot 02/17/2013   Status post foot surgery 09/09/2012   Hyperlipidemia     Major depressive disorder, recurrent, 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[place]) 02/25/2012    Past Surgical History:  Procedure Laterality Date   ANTERIOR CERVICAL DECOMP/DISCECTOMY FUSION N/A 11/05/2014   Procedure: ANTERIOR CERVICAL DECOMPRESSION/DISCECTOMY FUSION 1 LEVEL;  Surgeon: Consuella Lose, MD;  Location: [facility];  Service: Neurosurgery;  Laterality: N/A;  Cervical five- cervical seven anterior cervical decompression with fusion interbody prosthesis plating and bonegraft   CARPAL TUNNEL RELEASE Left 2001   Left wrist   CERVICAL LAMINECTOMY  03/31/20176   CERVICAL 4 SEVEN LAMINECTOMIES WITH LATERAL FUSION   [place] Osteotomy w/ Bone Graft Right 09/30/2014   Rt foot @ [facility]   GASTROC RECESSION EXTREMITY Right 3//20/14   Right foot   HERNIA REPAIR     1996   LAPAROSCOPY N/A 08/23/2020   Procedure: LAPAROSCOPIC LYSIS OF ADHESIONS AND OPEN REMOVAL OF ABDOMINAL WALL MESH;  Surgeon: Clovis Riley, MD;  Location: [facility];  Service: General;  Laterality: N/A;  90   Lapidus fusion Right 01/10/12   Rt foot   MIDTARSAL ARTHRODESIS Right 07/17/12   Rt foot   POSTERIOR CERVICAL FUSION/FORAMINOTOMY N/A 09/16/2015   Procedure: Cervical four - seven laminectomies with lateral mass fusion;  Surgeon: Consuella Lose, MD;  Location: [facility];  Service: Neurosurgery;  Laterality: N/A;  C4-7 laminectomy with lateral mass fusion   Remove implant deep Right 07/17/12   Rt foot   Remove Implant Deep Right 07/22/2014   Rt foot @ [facility]   SHOULDER ARTHROSCOPY WITH ROTATOR CUFF REPAIR AND SUBACROMIAL DECOMPRESSION Left 07/31/2016   Procedure: LEFT SHOULDER ARTHROSCOPY WITH ROTATOR CUFF REPAIR;  Surgeon: Renette Butters, MD;  Location: [facility];  Service: Orthopedics;  Laterality: Left;   TUBAL LIGATION       OB History   No obstetric history on file.     Family History  Problem Relation Age of Onset   Kidney disease Mother    Heart attack Father     Social History   Tobacco Use   Smoking status:  Former    Packs/day: 2.00    Years: 6.00    Pack years: 12.00    Types: Cigarettes    Quit date: 06/18/1978    Years since quitting: 42.5   Smokeless tobacco: Never  Vaping Use   Vaping Use: Never used  Substance Use Topics   Alcohol use: Yes    Alcohol/week: 0.0 standard drinks    Comment: weekend- use of beer    Drug use: No    Home Medications Prior to Admission medications   Medication Sig Start Date End Date Taking? Authorizing Provider  ondansetron (ZOFRAN ODT) 4 MG disintegrating tablet Take 1 tablet (4 mg total) by mouth every 8 (eight) hours as needed for nausea or vomiting. 12/08/20  Yes Tacy Learn, PA-C  amLODipine (NORVASC) 10 MG tablet Take 10 mg by mouth daily.  01/20/16   [provider]  atenolol-chlorthalidone (TENORETIC) 100-25 MG tablet Take 1 tablet by mouth daily.  [provider]  BD PEN NEEDLE NANO 2ND GEN 32G X 4 MM MISC See admin instructions. 06/27/20   [provider]  bisacodyl (DULCOLAX) 10 MG suppository Place 10 mg rectally daily as needed for mild constipation. 09/09/20   [provider]  budesonide-formoterol (SYMBICORT) 160-4.5 MCG/ACT inhaler Inhale 2 puffs into the lungs 2 (two) times daily. Patient taking differently: Inhale 2 puffs into the lungs 2 (two) times daily as needed (wheezing). 05/24/16   Parrett, Fonnie Mu, NP  capsaicin (ZOSTRIX) 0.025 % cream Apply topically 2 (two) times daily. Patient not taking: Reported on 12/07/2020 07/26/20   Charlesetta Shanks, MD  cyclobenzaprine (FLEXERIL) 10 MG tablet Take 10 mg by mouth every 12 (twelve) hours as needed for muscle spasms. 07/30/19   [provider]  diazepam (VALIUM) 2 MG tablet Take 1 tablet (2 mg total) by mouth every 6 (six) hours as needed for muscle spasms. 08/18/18   Nita Sells, MD  DULoxetine (CYMBALTA) 30 MG capsule Take 30 mg by mouth daily.    [provider]  glucose blood (ACCU-CHEK AVIVA PLUS) test strip TEST FOUR TIMES DAILY  07/31/19   [provider]  hydrOXYzine (ATARAX/VISTARIL) 25 MG tablet Take 25 mg by mouth at bedtime. 07/31/19   [provider]  LEVEMIR FLEXTOUCH 100 UNIT/ML FlexPen Inject 10 Units into the skin 2 (two) times daily. 06/27/20   [provider]  mirtazapine (REMERON) 15 MG tablet Take 15 mg by mouth at bedtime. 08/11/18   [provider]  oxyCODONE (ROXICODONE) 15 MG immediate release tablet Take 15 mg by mouth every 6 (six) hours as needed for pain. 07/30/19   [provider]  VENTOLIN HFA 108 (90 Base) MCG/ACT inhaler INHALE 1 TO 2 PUFFS INTO THE LUNGS EVERY 6 (SIX) HOURS AS NEEDED FOR WHEEZING OR SHORTNESS OF BREATH. Patient taking differently: Inhale 1-2 puffs into the lungs every 6 (six) hours as needed for wheezing or shortness of breath. 10/11/16   Rigoberto Noel, MD  VICTOZA 18 MG/3ML SOPN Inject 0.6 mg into the skin daily. 07/01/20   [provider]    Allergies    Celery (apium graveolens var. dulce) skin test, Celery oil, Pork-derived products, Latex, Other, Penicillins, and Tape  Review of Systems   Review of Systems  Constitutional:  Negative for fever.  HENT:  Negative for congestion.   Respiratory:  Negative for shortness of breath.   Cardiovascular:  Negative for chest pain.  Gastrointestinal:  Positive for abdominal pain, nausea and vomiting. Negative for constipation and diarrhea.  Genitourinary:  Negative for dysuria.  Musculoskeletal:  Negative for arthralgias and myalgias.  Skin:  Negative for rash and wound.  Allergic/Immunologic: Positive for immunocompromised state.  Neurological:  Positive for weakness.  Psychiatric/Behavioral:  Negative for confusion.   All other systems reviewed and are negative.  Physical Exam Updated Vital Signs BP (!) 169/87   Pulse 94   Temp 98.7 F (37.1 C) (Oral)   Resp 20   SpO2 97%   Physical Exam Vitals and nursing note reviewed.  Constitutional:      General: She is not in  acute distress.    Appearance: She is well-developed. She is obese. She is not diaphoretic.  HENT:     Head: Normocephalic and atraumatic.     Mouth/Throat:     Mouth: Mucous membranes are moist.  Eyes:     Conjunctiva/sclera: Conjunctivae normal.  Cardiovascular:     Rate and Rhythm: Normal rate and regular  rhythm.     Pulses: Normal pulses.     Heart sounds: Normal heart sounds.  Pulmonary:     Effort: Pulmonary effort is normal.     Breath sounds: Normal breath sounds.  Abdominal:     Palpations: Abdomen is soft.     Tenderness: There is generalized abdominal tenderness.     Comments: Periumbilical scar, well healed  Musculoskeletal:     Right lower leg: No edema.     Left lower leg: No edema.  Skin:    General: Skin is warm and dry.     Findings: No erythema or rash.  Neurological:     Mental Status: She is alert and oriented to person, place, and time.  Psychiatric:        Behavior: Behavior normal.    ED Results / Procedures / Treatments   Labs (all labs ordered are listed, but only abnormal results are displayed) Labs Reviewed  COMPREHENSIVE METABOLIC PANEL - Abnormal; Notable for the following components:      Result Value   Potassium 3.4 (*)    Calcium 8.1 (*)    Total Protein 6.0 (*)    Albumin 3.3 (*)    AST 11 (*)    All other components within normal limits  CBC WITH DIFFERENTIAL/PLATELET  LIPASE, BLOOD  URINALYSIS, ROUTINE W REFLEX MICROSCOPIC    EKG None  Radiology CT Abdomen Pelvis Wo Contrast  Result Date: 12/08/2020 CLINICAL DATA:  Abdominal distension EXAM: CT ABDOMEN AND PELVIS WITHOUT CONTRAST TECHNIQUE: Multidetector CT imaging of the abdomen and pelvis was performed following the standard protocol without IV contrast. COMPARISON:  11/06/2020 FINDINGS: Lower chest: No acute abnormality. Hepatobiliary: No focal liver abnormality is seen. No gallstones, gallbladder wall thickening, or biliary dilatation. Pancreas: Unremarkable. No pancreatic  ductal dilatation or surrounding inflammatory changes. Spleen: Normal in size without focal abnormality. Adrenals/Urinary Tract: Adrenal glands are within normal limits. Kidneys demonstrate a normal appearance bilaterally. No renal calculi or obstructive changes are noted. Ureters are within normal limits. Bladder is decompressed. Stomach/Bowel: Mild retained fecal material is noted within the colon although no obstructive changes are seen. The appendix is within normal limits. Small bowel and stomach are unremarkable. Vascular/Lymphatic: No significant vascular findings are present. No enlarged abdominal or pelvic lymph nodes. Reproductive: Uterus and bilateral adnexa are unremarkable. Other: Stable rectus diastasis although the degree of herniated fat has increased slightly. No bowel is noted within. Some mild subcutaneous inflammatory changes are noted similar to that seen on prior exam. No new hernia or free fluid is seen. Musculoskeletal: No acute or significant osseous findings. IMPRESSION: Stable rectus diastasis with slight increase in herniated fat anteriorly. No bowel is noted within. Mild changes of colonic constipation without obstructive change. No other focal abnormality is noted. Electronically Signed   By: Inez Catalina M.D.   On: 12/08/2020 18:52   CT Head Wo Contrast  Result Date: 12/07/2020 CLINICAL DATA:  50 year old female with numbness and tingling of the left side. EXAM: CT HEAD WITHOUT CONTRAST TECHNIQUE: Contiguous axial images were obtained from the base of the skull through the vertex without intravenous contrast. COMPARISON:  Head CT dated 01/05/2017. FINDINGS: Brain: The ventricles and sulci appropriate size for patient's age. The gray-white matter discrimination is preserved. There is no acute intracranial hemorrhage. No mass effect midline shift no extra-axial fluid collection. Vascular: No hyperdense vessel or unexpected calcification. Skull: Normal. Negative for fracture or focal  lesion. Sinuses/Orbits: No acute finding. Other: None IMPRESSION: Unremarkable noncontrast CT of  the brain. Electronically Signed   By: Anner Crete M.D.   On: 12/07/2020 16:07   MR BRAIN WO CONTRAST  Result Date: 12/07/2020 CLINICAL DATA:  Initial evaluation for neuro deficit, stroke suspected, cervical radiculopathy. EXAM: MRI HEAD WITHOUT CONTRAST MRI CERVICAL SPINE WITHOUT CONTRAST TECHNIQUE: Multiplanar, multiecho pulse sequences of the brain and surrounding structures, and cervical spine, to include the craniocervical junction and cervicothoracic junction, were obtained without intravenous contrast. COMPARISON:  Prior head CT from earlier the same day. FINDINGS: MRI HEAD FINDINGS Brain: Cerebral volume within normal limits for age. Few scattered foci of T2/FLAIR hyperintensity noted involving the periventricular and deep white matter both cerebral hemispheres, nonspecific, but most like related chronic microvascular ischemic disease, mild in nature. No abnormal foci of restricted diffusion to suggest acute or subacute ischemia. Gray-white matter differentiation maintained. No encephalomalacia to suggest chronic cortical infarction. No evidence for acute or chronic intracranial hemorrhage. No mass lesion, midline shift or mass effect. No hydrocephalus or extra-axial fluid collection. Pituitary gland suprasellar region within normal limits. Midline structures intact. Vascular: Major intracranial vascular flow voids are maintained. There is a clustered area of apparent vascular flow voids centered near the left middle cerebellar peduncle (series 8, images 8, 9), nonspecific, but could reflect a small vascular malformation. Finding similar to perhaps slightly more prominent as compared to previous exam. Skull and upper cervical spine: Craniocervical junction within normal limits. Bone marrow signal intensity normal. No scalp soft tissue abnormality. Sinuses/Orbits: Globes and orbital soft tissues  demonstrate no acute finding. Paranasal sinuses are largely clear. No significant mastoid effusion. Inner ear structures grossly normal. Other: None. MRI CERVICAL SPINE FINDINGS Alignment: Straightening of the normal cervical lordosis. No listhesis. Vertebrae: Susceptibility artifact related to prior ACDF at C5-6 with solid arthrodesis. Prior posterior fusion at C4-C7. Vertebral body height maintained without acute or chronic fracture. Bone marrow signal intensity somewhat diffusely decreased on T1 weighted imaging, nonspecific, but most commonly related to anemia, smoking, or obesity. No discrete or worrisome osseous lesions. No abnormal marrow edema. Cord: Flattening with question of subtle cord signal changes involving the right hemi cord at the level of C5-6 (series 8, image 28). Signal intensity within the cord is otherwise within normal limits. Posterior Fossa, vertebral arteries, paraspinal tissues: Craniocervical junction within normal limits. Paraspinous and prevertebral soft tissues demonstrate no acute finding. Normal flow voids seen within the vertebral arteries bilaterally. Disc levels: C2-C3: Unremarkable. C3-C4: Mild disc bulge with uncovertebral hypertrophy. Flattening and partial effacement of the ventral thecal sac without significant spinal stenosis or cord impingement. Mild left greater than right C4 foraminal stenosis related to disc bulge and uncovertebral hypertrophy. C4-C5: Right paracentral disc extrusion and/or endplate spurring indents the ventral thecal sac. Associated inferior migration posterior to the C5 and C6 vertebral bodies. No more than mild flattening of the right hemi cord at the level of C4-5. Prior posterior decompression with wide patency of the thecal sac. Foramina remain patent. C5-C6: Prior anterior and posterior fusion with posterior decompression. Right paracentral spurring seen posterior to the C5 and C6 vertebral bodies indents the right ventral thecal sac, contacting  and flattening the right hemicord (series 8, image 27). Question associated subtle myelomalacia at the level of C5-6 (series 8, image 28). Thecal sac remains patent given the decompression. No significant foraminal stenosis. C6-C7: Negative interspace. Prior posterior decompression. No canal or foraminal stenosis. C7-T1: Negative interspace. Mild facet hypertrophy. No canal or foraminal stenosis. Visualized upper thoracic spine demonstrates no significant finding. IMPRESSION: MRI HEAD IMPRESSION:  1. No acute intracranial abnormality. 2. Mild cerebral white matter disease, nonspecific, but most likely related to mild chronic microvascular ischemic disease. 3. Small 11 mm cluster of apparent vascular flow voids centered at the left middle cerebellar peduncle, nonspecific, but could reflect a small vascular malformation/AVM. Finding is similar to perhaps slightly more prominent as compared to previous MRI from 2017. Finding could be further assessed with dedicated vascular imaging of the brain as clinically warranted. MRI CERVICAL SPINE IMPRESSION: 1. Postoperative changes including prior ACDF at C5-6 with posterior fusion and decompression at C4-C7. 2. Right disc extrusion and/or osseous spurring at the C5-C6 level, contacting and flattening the right hemicord. Question associated subtle myelomalacia at this level. Overall, appearance is similar to previous. 3. Additional mild spondylosis elsewhere within the cervical spine as above. No other significant stenosis or impingement. Electronically Signed   By: Jeannine Boga M.D.   On: 12/07/2020 22:47   MR Cervical Spine Wo Contrast  Result Date: 12/07/2020 CLINICAL DATA:  Initial evaluation for neuro deficit, stroke suspected, cervical radiculopathy. EXAM: MRI HEAD WITHOUT CONTRAST MRI CERVICAL SPINE WITHOUT CONTRAST TECHNIQUE: Multiplanar, multiecho pulse sequences of the brain and surrounding structures, and cervical spine, to include the craniocervical  junction and cervicothoracic junction, were obtained without intravenous contrast. COMPARISON:  Prior head CT from earlier the same day. FINDINGS: MRI HEAD FINDINGS Brain: Cerebral volume within normal limits for age. Few scattered foci of T2/FLAIR hyperintensity noted involving the periventricular and deep white matter both cerebral hemispheres, nonspecific, but most like related chronic microvascular ischemic disease, mild in nature. No abnormal foci of restricted diffusion to suggest acute or subacute ischemia. Gray-white matter differentiation maintained. No encephalomalacia to suggest chronic cortical infarction. No evidence for acute or chronic intracranial hemorrhage. No mass lesion, midline shift or mass effect. No hydrocephalus or extra-axial fluid collection. Pituitary gland suprasellar region within normal limits. Midline structures intact. Vascular: Major intracranial vascular flow voids are maintained. There is a clustered area of apparent vascular flow voids centered near the left middle cerebellar peduncle (series 8, images 8, 9), nonspecific, but could reflect a small vascular malformation. Finding similar to perhaps slightly more prominent as compared to previous exam. Skull and upper cervical spine: Craniocervical junction within normal limits. Bone marrow signal intensity normal. No scalp soft tissue abnormality. Sinuses/Orbits: Globes and orbital soft tissues demonstrate no acute finding. Paranasal sinuses are largely clear. No significant mastoid effusion. Inner ear structures grossly normal. Other: None. MRI CERVICAL SPINE FINDINGS Alignment: Straightening of the normal cervical lordosis. No listhesis. Vertebrae: Susceptibility artifact related to prior ACDF at C5-6 with solid arthrodesis. Prior posterior fusion at C4-C7. Vertebral body height maintained without acute or chronic fracture. Bone marrow signal intensity somewhat diffusely decreased on T1 weighted imaging, nonspecific, but most  commonly related to anemia, smoking, or obesity. No discrete or worrisome osseous lesions. No abnormal marrow edema. Cord: Flattening with question of subtle cord signal changes involving the right hemi cord at the level of C5-6 (series 8, image 28). Signal intensity within the cord is otherwise within normal limits. Posterior Fossa, vertebral arteries, paraspinal tissues: Craniocervical junction within normal limits. Paraspinous and prevertebral soft tissues demonstrate no acute finding. Normal flow voids seen within the vertebral arteries bilaterally. Disc levels: C2-C3: Unremarkable. C3-C4: Mild disc bulge with uncovertebral hypertrophy. Flattening and partial effacement of the ventral thecal sac without significant spinal stenosis or cord impingement. Mild left greater than right C4 foraminal stenosis related to disc bulge and uncovertebral hypertrophy. C4-C5: Right paracentral disc extrusion  and/or endplate spurring indents the ventral thecal sac. Associated inferior migration posterior to the C5 and C6 vertebral bodies. No more than mild flattening of the right hemi cord at the level of C4-5. Prior posterior decompression with wide patency of the thecal sac. Foramina remain patent. C5-C6: Prior anterior and posterior fusion with posterior decompression. Right paracentral spurring seen posterior to the C5 and C6 vertebral bodies indents the right ventral thecal sac, contacting and flattening the right hemicord (series 8, image 27). Question associated subtle myelomalacia at the level of C5-6 (series 8, image 28). Thecal sac remains patent given the decompression. No significant foraminal stenosis. C6-C7: Negative interspace. Prior posterior decompression. No canal or foraminal stenosis. C7-T1: Negative interspace. Mild facet hypertrophy. No canal or foraminal stenosis. Visualized upper thoracic spine demonstrates no significant finding. IMPRESSION: MRI HEAD IMPRESSION: 1. No acute intracranial abnormality. 2.  Mild cerebral white matter disease, nonspecific, but most likely related to mild chronic microvascular ischemic disease. 3. Small 11 mm cluster of apparent vascular flow voids centered at the left middle cerebellar peduncle, nonspecific, but could reflect a small vascular malformation/AVM. Finding is similar to perhaps slightly more prominent as compared to previous MRI from 2017. Finding could be further assessed with dedicated vascular imaging of the brain as clinically warranted. MRI CERVICAL SPINE IMPRESSION: 1. Postoperative changes including prior ACDF at C5-6 with posterior fusion and decompression at C4-C7. 2. Right disc extrusion and/or osseous spurring at the C5-C6 level, contacting and flattening the right hemicord. Question associated subtle myelomalacia at this level. Overall, appearance is similar to previous. 3. Additional mild spondylosis elsewhere within the cervical spine as above. No other significant stenosis or impingement. Electronically Signed   By: Jeannine Boga M.D.   On: 12/07/2020 22:47    Procedures Procedures   Medications Ordered in ED Medications  sodium chloride 0.9 % bolus 1,000 mL (1,000 mLs Intravenous New Bag/Given 12/08/20 2122)  ondansetron [facility]) injection 4 mg (4 mg Intravenous Given 12/08/20 2119)  oxyCODONE (Oxy IR/ROXICODONE) immediate release tablet 15 mg (15 mg Oral Given 12/08/20 2150)  amLODipine (NORVASC) tablet 10 mg (10 mg Oral Given 12/08/20 2150)    ED Course  I have reviewed the triage vital signs and the nursing notes.  Pertinent labs & imaging results that were available during my care of the patient were reviewed by me and considered in my medical decision making (see chart for details).  Clinical Course as of 12/08/20 2303  Thu Dec 09, 6439  8038 50 year old female presents with complaint of abdominal pain with nausea and vomiting x3 months.  On exam, found to have generalized abdominal tenderness, moist mucous membranes,  nontoxic-appearing. Patient was given Zofran, IV fluids as well as her Norvasc. Labs reviewed and reassuring including CBC and CMP without significant findings, lipase within normal limits. Patient's blood pressure cuff was on her left arm which she was lying on, previously reported blood pressure reading of 233/117 and other high readings felt to be inaccurate, cuff was placed on her right arm and measures 169/87. CT without acute findings. Patient is tolerating oral fluids and medications without difficulty. Reviewed results with the patient who feels like she needs to be admitted to the hospital however I have not found anything to support this decision.  Recommend patient follow-up with her primary care provider, given referral to GI as well as prescription for Zofran. [LM]    Clinical Course User Index [LM] Roque Lias   MDM Rules/Calculators/A&P  Final Clinical Impression(s) / ED Diagnoses Final diagnoses:  Non-intractable vomiting with nausea, unspecified vomiting type  Generalized abdominal pain    Rx / DC Orders ED Discharge Orders          Ordered    ondansetron (ZOFRAN ODT) 4 MG disintegrating tablet  Every 8 hours PRN        12/08/20 2259             Tacy Learn, PA-C 12/08/20 2303    Lacretia Leigh, MD 12/08/20 2315

## 2020-12-08 NOTE — ED Triage Notes (Signed)
Pt BIB [facility] after having N/V today at the pain clinic. Chronic hx of same. IV placed in pain clinic. 22g L hand. 339ml NS given in clinic and sent here via EMS. Alert and oriented.

## 2020-12-08 NOTE — Discharge Instructions (Addendum)
Your labs today are reassuring. The CT scan of your abdomen does not show any emergent problems. Recommend follow up with your doctor, consider follow up with GI, referral given. Zofran prescription given to help with nausea and vomiting.

## 2020-12-09 DIAGNOSIS — J4540 Moderate persistent asthma, uncomplicated: Secondary | ICD-10-CM | POA: Diagnosis not present

## 2020-12-09 DIAGNOSIS — G629 Polyneuropathy, unspecified: Secondary | ICD-10-CM | POA: Diagnosis not present

## 2020-12-09 DIAGNOSIS — M25562 Pain in left knee: Secondary | ICD-10-CM | POA: Diagnosis not present

## 2020-12-09 DIAGNOSIS — M25512 Pain in left shoulder: Secondary | ICD-10-CM | POA: Diagnosis not present

## 2020-12-09 DIAGNOSIS — R110 Nausea: Secondary | ICD-10-CM | POA: Diagnosis not present

## 2020-12-09 DIAGNOSIS — G8929 Other chronic pain: Secondary | ICD-10-CM | POA: Diagnosis not present

## 2020-12-09 DIAGNOSIS — E119 Type 2 diabetes mellitus without complications: Secondary | ICD-10-CM | POA: Diagnosis not present

## 2020-12-09 DIAGNOSIS — G894 Chronic pain syndrome: Secondary | ICD-10-CM | POA: Diagnosis not present

## 2020-12-09 DIAGNOSIS — Z79891 Long term (current) use of opiate analgesic: Secondary | ICD-10-CM | POA: Diagnosis not present

## 2020-12-09 DIAGNOSIS — M25551 Pain in right hip: Secondary | ICD-10-CM | POA: Diagnosis not present

## 2020-12-09 DIAGNOSIS — Z794 Long term (current) use of insulin: Secondary | ICD-10-CM | POA: Diagnosis not present

## 2020-12-09 DIAGNOSIS — F1120 Opioid dependence, uncomplicated: Secondary | ICD-10-CM | POA: Diagnosis not present

## 2020-12-09 DIAGNOSIS — I10 Essential (primary) hypertension: Secondary | ICD-10-CM | POA: Diagnosis not present

## 2020-12-09 DIAGNOSIS — E1165 Type 2 diabetes mellitus with hyperglycemia: Secondary | ICD-10-CM | POA: Diagnosis not present

## 2020-12-09 DIAGNOSIS — R531 Weakness: Secondary | ICD-10-CM | POA: Diagnosis not present

## 2020-12-09 DIAGNOSIS — M545 Low back pain, unspecified: Secondary | ICD-10-CM | POA: Diagnosis not present

## 2020-12-09 DIAGNOSIS — M79642 Pain in left hand: Secondary | ICD-10-CM | POA: Diagnosis not present

## 2020-12-09 DIAGNOSIS — M25561 Pain in right knee: Secondary | ICD-10-CM | POA: Diagnosis not present

## 2020-12-09 DIAGNOSIS — I119 Hypertensive heart disease without heart failure: Secondary | ICD-10-CM | POA: Diagnosis not present

## 2020-12-09 DIAGNOSIS — M79641 Pain in right hand: Secondary | ICD-10-CM | POA: Diagnosis not present

## 2020-12-09 DIAGNOSIS — G479 Sleep disorder, unspecified: Secondary | ICD-10-CM | POA: Diagnosis not present

## 2020-12-10 DIAGNOSIS — E119 Type 2 diabetes mellitus without complications: Secondary | ICD-10-CM | POA: Diagnosis not present

## 2020-12-11 DIAGNOSIS — E119 Type 2 diabetes mellitus without complications: Secondary | ICD-10-CM | POA: Diagnosis not present

## 2020-12-12 DIAGNOSIS — E119 Type 2 diabetes mellitus without complications: Secondary | ICD-10-CM | POA: Diagnosis not present

## 2020-12-13 DIAGNOSIS — E119 Type 2 diabetes mellitus without complications: Secondary | ICD-10-CM | POA: Diagnosis not present

## 2020-12-14 DIAGNOSIS — E119 Type 2 diabetes mellitus without complications: Secondary | ICD-10-CM | POA: Diagnosis not present

## 2020-12-15 DIAGNOSIS — G4733 Obstructive sleep apnea (adult) (pediatric): Secondary | ICD-10-CM | POA: Diagnosis not present

## 2020-12-15 DIAGNOSIS — E119 Type 2 diabetes mellitus without complications: Secondary | ICD-10-CM | POA: Diagnosis not present

## 2020-12-16 DIAGNOSIS — E119 Type 2 diabetes mellitus without complications: Secondary | ICD-10-CM | POA: Diagnosis not present

## 2020-12-17 DIAGNOSIS — E119 Type 2 diabetes mellitus without complications: Secondary | ICD-10-CM | POA: Diagnosis not present

## 2020-12-18 DIAGNOSIS — E119 Type 2 diabetes mellitus without complications: Secondary | ICD-10-CM | POA: Diagnosis not present

## 2020-12-19 DIAGNOSIS — E119 Type 2 diabetes mellitus without complications: Secondary | ICD-10-CM | POA: Diagnosis not present

## 2020-12-20 DIAGNOSIS — E119 Type 2 diabetes mellitus without complications: Secondary | ICD-10-CM | POA: Diagnosis not present

## 2020-12-21 ENCOUNTER — Other Ambulatory Visit: Payer: Self-pay | Admitting: *Deleted

## 2020-12-21 DIAGNOSIS — E119 Type 2 diabetes mellitus without complications: Secondary | ICD-10-CM | POA: Diagnosis not present

## 2020-12-21 NOTE — Patient Outreach (Signed)
[facility]) Care Management  12/21/2020  Diana Henderson Nov 16, 1970 [id number]   [facility] Telephone Assessment/Screen for [employer] referral  Referral Date: 12/02/20 Referral Source: Holland Falling MA Referral Reason: [facility] complex care-Tier 4/ [employer] [place] 5 ED visits, DM, COPD/Asthma, HTN Insurance: [employer] medicare   [facility] Unsuccessful outreach   Outreach attempt to the home number  No answer. [facility] RN CM left HIPAA [facility] Portability and Accountability Act) compliant voicemail message along with CM's contact info.   Plan: [facility] RN CM scheduled this patient for another call attempt within 4-7 business days Unsuccessful outreach letter sent on 12/21/20 Unsuccessful outreach on 12/21/20  Joelene Millin L. Lavina Hamman, RN, BSN, [place] Coordinator Office number [phone] Mobile number [phone]  Main [facility] number [phone] Fax number [phone]

## 2020-12-22 DIAGNOSIS — E119 Type 2 diabetes mellitus without complications: Secondary | ICD-10-CM | POA: Diagnosis not present

## 2020-12-23 DIAGNOSIS — J4540 Moderate persistent asthma, uncomplicated: Secondary | ICD-10-CM | POA: Diagnosis not present

## 2020-12-23 DIAGNOSIS — E119 Type 2 diabetes mellitus without complications: Secondary | ICD-10-CM | POA: Diagnosis not present

## 2020-12-23 DIAGNOSIS — R110 Nausea: Secondary | ICD-10-CM | POA: Diagnosis not present

## 2020-12-23 DIAGNOSIS — E1165 Type 2 diabetes mellitus with hyperglycemia: Secondary | ICD-10-CM | POA: Diagnosis not present

## 2020-12-23 DIAGNOSIS — G629 Polyneuropathy, unspecified: Secondary | ICD-10-CM | POA: Diagnosis not present

## 2020-12-23 DIAGNOSIS — Z794 Long term (current) use of insulin: Secondary | ICD-10-CM | POA: Diagnosis not present

## 2020-12-23 DIAGNOSIS — R531 Weakness: Secondary | ICD-10-CM | POA: Diagnosis not present

## 2020-12-23 DIAGNOSIS — I119 Hypertensive heart disease without heart failure: Secondary | ICD-10-CM | POA: Diagnosis not present

## 2020-12-23 DIAGNOSIS — I10 Essential (primary) hypertension: Secondary | ICD-10-CM | POA: Diagnosis not present

## 2020-12-24 DIAGNOSIS — E119 Type 2 diabetes mellitus without complications: Secondary | ICD-10-CM | POA: Diagnosis not present

## 2020-12-25 DIAGNOSIS — E119 Type 2 diabetes mellitus without complications: Secondary | ICD-10-CM | POA: Diagnosis not present

## 2020-12-26 DIAGNOSIS — R109 Unspecified abdominal pain: Secondary | ICD-10-CM | POA: Diagnosis not present

## 2020-12-26 DIAGNOSIS — G8929 Other chronic pain: Secondary | ICD-10-CM | POA: Diagnosis not present

## 2020-12-26 DIAGNOSIS — R1031 Right lower quadrant pain: Secondary | ICD-10-CM | POA: Diagnosis not present

## 2020-12-26 DIAGNOSIS — K469 Unspecified abdominal hernia without obstruction or gangrene: Secondary | ICD-10-CM | POA: Diagnosis not present

## 2020-12-26 DIAGNOSIS — R1084 Generalized abdominal pain: Secondary | ICD-10-CM | POA: Diagnosis not present

## 2020-12-26 DIAGNOSIS — K439 Ventral hernia without obstruction or gangrene: Secondary | ICD-10-CM | POA: Diagnosis not present

## 2020-12-26 DIAGNOSIS — Z79899 Other long term (current) drug therapy: Secondary | ICD-10-CM | POA: Diagnosis not present

## 2020-12-26 DIAGNOSIS — M87052 Idiopathic aseptic necrosis of left femur: Secondary | ICD-10-CM | POA: Diagnosis not present

## 2020-12-26 DIAGNOSIS — M545 Low back pain, unspecified: Secondary | ICD-10-CM | POA: Diagnosis not present

## 2020-12-26 DIAGNOSIS — E119 Type 2 diabetes mellitus without complications: Secondary | ICD-10-CM | POA: Diagnosis not present

## 2020-12-26 DIAGNOSIS — K6289 Other specified diseases of anus and rectum: Secondary | ICD-10-CM | POA: Diagnosis not present

## 2020-12-26 DIAGNOSIS — Z794 Long term (current) use of insulin: Secondary | ICD-10-CM | POA: Diagnosis not present

## 2020-12-26 DIAGNOSIS — R197 Diarrhea, unspecified: Secondary | ICD-10-CM | POA: Diagnosis not present

## 2020-12-27 DIAGNOSIS — R110 Nausea: Secondary | ICD-10-CM | POA: Diagnosis not present

## 2020-12-27 DIAGNOSIS — I10 Essential (primary) hypertension: Secondary | ICD-10-CM | POA: Diagnosis not present

## 2020-12-27 DIAGNOSIS — Z794 Long term (current) use of insulin: Secondary | ICD-10-CM | POA: Diagnosis not present

## 2020-12-27 DIAGNOSIS — K6289 Other specified diseases of anus and rectum: Secondary | ICD-10-CM | POA: Diagnosis not present

## 2020-12-27 DIAGNOSIS — E119 Type 2 diabetes mellitus without complications: Secondary | ICD-10-CM | POA: Diagnosis not present

## 2020-12-27 DIAGNOSIS — E1165 Type 2 diabetes mellitus with hyperglycemia: Secondary | ICD-10-CM | POA: Diagnosis not present

## 2020-12-27 DIAGNOSIS — I119 Hypertensive heart disease without heart failure: Secondary | ICD-10-CM | POA: Diagnosis not present

## 2020-12-27 DIAGNOSIS — J4540 Moderate persistent asthma, uncomplicated: Secondary | ICD-10-CM | POA: Diagnosis not present

## 2020-12-27 DIAGNOSIS — M8700 Idiopathic aseptic necrosis of unspecified bone: Secondary | ICD-10-CM | POA: Diagnosis not present

## 2020-12-27 DIAGNOSIS — R531 Weakness: Secondary | ICD-10-CM | POA: Diagnosis not present

## 2020-12-28 ENCOUNTER — Other Ambulatory Visit: Payer: Self-pay | Admitting: *Deleted

## 2020-12-28 DIAGNOSIS — K439 Ventral hernia without obstruction or gangrene: Secondary | ICD-10-CM | POA: Diagnosis not present

## 2020-12-28 DIAGNOSIS — M549 Dorsalgia, unspecified: Secondary | ICD-10-CM | POA: Diagnosis not present

## 2020-12-28 DIAGNOSIS — G609 Hereditary and idiopathic neuropathy, unspecified: Secondary | ICD-10-CM | POA: Diagnosis not present

## 2020-12-28 DIAGNOSIS — E119 Type 2 diabetes mellitus without complications: Secondary | ICD-10-CM | POA: Diagnosis not present

## 2020-12-28 DIAGNOSIS — R202 Paresthesia of skin: Secondary | ICD-10-CM | POA: Diagnosis not present

## 2020-12-28 DIAGNOSIS — M5416 Radiculopathy, lumbar region: Secondary | ICD-10-CM | POA: Diagnosis not present

## 2020-12-28 DIAGNOSIS — M25552 Pain in left hip: Secondary | ICD-10-CM | POA: Diagnosis not present

## 2020-12-28 NOTE — Patient Outreach (Addendum)
[facility]) Care Management  12/28/2020  Jamesia Linnen 1970-12-14 [id number]   [facility] second Telephone Assessment/Screen for [employer] referral   Referral Date: 12/02/20 Referral Source: Holland Falling MA Referral Reason: [facility] complex care-Tier 4/ [employer] [place] 5 ED visits, DM, COPD/Asthma, HTN Insurance: [employer] medicare    [facility] Unsuccessful outreach     Outreach attempt to the home number [phone] No answer. [facility] RN CM left HIPAA [facility] Portability and Accountability Act) compliant voicemail message along with CM's contact info.   Plan: [facility] RN CM scheduled this patient for another call attempt within 4-7 business days Unsuccessful outreach letter sent on 12/21/20 Unsuccessful outreach on 12/21/20,12/28/20   Joelene Millin L. Lavina Hamman, RN, BSN, [place] Coordinator Office number [phone] Mobile number [phone] Main [facility] number [phone] Fax number [phone]

## 2020-12-29 DIAGNOSIS — E119 Type 2 diabetes mellitus without complications: Secondary | ICD-10-CM | POA: Diagnosis not present

## 2020-12-29 DIAGNOSIS — R69 Illness, unspecified: Secondary | ICD-10-CM | POA: Diagnosis not present

## 2020-12-30 ENCOUNTER — Encounter (HOSPITAL_BASED_OUTPATIENT_CLINIC_OR_DEPARTMENT_OTHER): Payer: Self-pay

## 2020-12-30 ENCOUNTER — Emergency Department (HOSPITAL_BASED_OUTPATIENT_CLINIC_OR_DEPARTMENT_OTHER): Payer: Medicare HMO

## 2020-12-30 ENCOUNTER — Emergency Department (HOSPITAL_BASED_OUTPATIENT_CLINIC_OR_DEPARTMENT_OTHER)
Admission: EM | Admit: 2020-12-30 | Discharge: 2020-12-30 | Disposition: A | Discharge status: Home / Self Care | Payer: Medicare HMO | Attending: Emergency Medicine | Admitting: Emergency Medicine

## 2020-12-30 ENCOUNTER — Other Ambulatory Visit: Payer: Self-pay

## 2020-12-30 DIAGNOSIS — Z87891 Personal history of nicotine dependence: Secondary | ICD-10-CM | POA: Insufficient documentation

## 2020-12-30 DIAGNOSIS — J4540 Moderate persistent asthma, uncomplicated: Secondary | ICD-10-CM | POA: Diagnosis not present

## 2020-12-30 DIAGNOSIS — I5032 Chronic diastolic (congestive) heart failure: Secondary | ICD-10-CM | POA: Diagnosis not present

## 2020-12-30 DIAGNOSIS — J449 Chronic obstructive pulmonary disease, unspecified: Secondary | ICD-10-CM | POA: Insufficient documentation

## 2020-12-30 DIAGNOSIS — E1142 Type 2 diabetes mellitus with diabetic polyneuropathy: Secondary | ICD-10-CM | POA: Insufficient documentation

## 2020-12-30 DIAGNOSIS — G629 Polyneuropathy, unspecified: Secondary | ICD-10-CM | POA: Diagnosis not present

## 2020-12-30 DIAGNOSIS — Z79899 Other long term (current) drug therapy: Secondary | ICD-10-CM | POA: Insufficient documentation

## 2020-12-30 DIAGNOSIS — M8700 Idiopathic aseptic necrosis of unspecified bone: Secondary | ICD-10-CM | POA: Diagnosis not present

## 2020-12-30 DIAGNOSIS — E1165 Type 2 diabetes mellitus with hyperglycemia: Secondary | ICD-10-CM | POA: Diagnosis not present

## 2020-12-30 DIAGNOSIS — R109 Unspecified abdominal pain: Secondary | ICD-10-CM | POA: Diagnosis not present

## 2020-12-30 DIAGNOSIS — K439 Ventral hernia without obstruction or gangrene: Secondary | ICD-10-CM | POA: Insufficient documentation

## 2020-12-30 DIAGNOSIS — I119 Hypertensive heart disease without heart failure: Secondary | ICD-10-CM | POA: Diagnosis not present

## 2020-12-30 DIAGNOSIS — I10 Essential (primary) hypertension: Secondary | ICD-10-CM | POA: Diagnosis not present

## 2020-12-30 DIAGNOSIS — Z9889 Other specified postprocedural states: Secondary | ICD-10-CM | POA: Diagnosis not present

## 2020-12-30 DIAGNOSIS — K6289 Other specified diseases of anus and rectum: Secondary | ICD-10-CM | POA: Diagnosis not present

## 2020-12-30 DIAGNOSIS — E119 Type 2 diabetes mellitus without complications: Secondary | ICD-10-CM | POA: Diagnosis not present

## 2020-12-30 DIAGNOSIS — Z91040 Latex allergy status: Secondary | ICD-10-CM | POA: Insufficient documentation

## 2020-12-30 DIAGNOSIS — I110 Hypertensive heart disease with heart failure: Secondary | ICD-10-CM | POA: Diagnosis not present

## 2020-12-30 DIAGNOSIS — Z794 Long term (current) use of insulin: Secondary | ICD-10-CM | POA: Diagnosis not present

## 2020-12-30 LAB — CBC WITH DIFFERENTIAL/PLATELET
Abs Immature Granulocytes: 0.01 10*3/uL (ref 0.00–0.07)
Basophils Absolute: 0 10*3/uL (ref 0.0–0.1)
Basophils Relative: 0 %
Eosinophils Absolute: 0.1 10*3/uL (ref 0.0–0.5)
Eosinophils Relative: 1 %
HCT: 39.8 % (ref 36.0–46.0)
Hemoglobin: 12.7 g/dL (ref 12.0–15.0)
Immature Granulocytes: 0 %
Lymphocytes Relative: 37 %
Lymphs Abs: 2.1 10*3/uL (ref 0.7–4.0)
MCH: 26.4 pg (ref 26.0–34.0)
MCHC: 31.9 g/dL (ref 30.0–36.0)
MCV: 82.7 fL (ref 80.0–100.0)
Monocytes Absolute: 0.5 10*3/uL (ref 0.1–1.0)
Monocytes Relative: 8 %
Neutro Abs: 3 10*3/uL (ref 1.7–7.7)
Neutrophils Relative %: 54 %
Platelets: 271 10*3/uL (ref 150–400)
RBC: 4.81 MIL/uL (ref 3.87–5.11)
RDW: 14.7 % (ref 11.5–15.5)
WBC: 5.6 10*3/uL (ref 4.0–10.5)
nRBC: 0 % (ref 0.0–0.2)

## 2020-12-30 LAB — URINALYSIS, ROUTINE W REFLEX MICROSCOPIC
Bilirubin Urine: NEGATIVE
Glucose, UA: NEGATIVE mg/dL
Hgb urine dipstick: NEGATIVE
Ketones, ur: NEGATIVE mg/dL
Leukocytes,Ua: NEGATIVE
Nitrite: NEGATIVE
Protein, ur: NEGATIVE mg/dL
Specific Gravity, Urine: 1.015 (ref 1.005–1.030)
pH: 5.5 (ref 5.0–8.0)

## 2020-12-30 LAB — COMPREHENSIVE METABOLIC PANEL
ALT: 12 U/L (ref 0–44)
AST: 17 U/L (ref 15–41)
Albumin: 3.7 g/dL (ref 3.5–5.0)
Alkaline Phosphatase: 63 U/L (ref 38–126)
Anion gap: 8 (ref 5–15)
BUN: 5 mg/dL — ABNORMAL LOW (ref 6–20)
CO2: 29 mmol/L (ref 22–32)
Calcium: 8.9 mg/dL (ref 8.9–10.3)
Chloride: 100 mmol/L (ref 98–111)
Creatinine, Ser: 0.82 mg/dL (ref 0.44–1.00)
GFR, Estimated: >60 mL/min (ref >60)
Glucose, Bld: 149 mg/dL — ABNORMAL HIGH (ref 70–99)
Potassium: 3.8 mmol/L (ref 3.5–5.1)
Sodium: 137 mmol/L (ref 135–145)
Total Bilirubin: 0.5 mg/dL (ref 0.3–1.2)
Total Protein: 7.1 g/dL (ref 6.5–8.1)

## 2020-12-30 LAB — LIPASE, BLOOD: Lipase: 24 U/L (ref 11–51)

## 2020-12-30 LAB — PREGNANCY, URINE: Preg Test, Ur: NEGATIVE

## 2020-12-30 MED ORDER — ONDANSETRON HCL 4 MG/2ML IJ SOLN
4.0000 mg | Freq: Once | INTRAMUSCULAR | Status: AC
  Administered 2020-12-30: 4 mg via INTRAVENOUS
  Filled 2020-12-30: qty 2

## 2020-12-30 MED ORDER — MORPHINE SULFATE (PF) 4 MG/ML IV SOLN
4.0000 mg | Freq: Once | INTRAVENOUS | Status: AC
  Administered 2020-12-30: 4 mg via INTRAVENOUS
  Filled 2020-12-30: qty 1

## 2020-12-30 MED ORDER — IOHEXOL 300 MG/ML  SOLN
100.0000 mL | Freq: Once | INTRAMUSCULAR | Status: AC | PRN
  Administered 2020-12-30: 100 mL via INTRAVENOUS

## 2020-12-30 NOTE — ED Notes (Signed)
ED Provider at bedside. 

## 2020-12-30 NOTE — ED Notes (Signed)
Pt amb to [facility] with assistance

## 2020-12-30 NOTE — ED Triage Notes (Signed)
Pt c/o abd pain-states she had abd surgery in March-was dx with a hernia after surgery-states abd pain/swelling increase x 1 month-NAD--was seen/sent by PCP today-to triage in w/c

## 2020-12-30 NOTE — Discharge Instructions (Addendum)
Follow-up with your GI doctor as planned.  Follow-up with the surgeon to discuss further treatment of your ventral hernia

## 2020-12-30 NOTE — ED Provider Notes (Signed)
[place] EMERGENCY DEPARTMENT Provider Note   CSN: [id number] Arrival date & time: 12/30/20  1406     History Chief Complaint  Patient presents with   Abdominal Pain    Diana Henderson is a 50 y.o. female.   Abdominal Pain  Patient states she has been having issues with abdominal pain for couple of months now.  Patient feels that her symptoms have been ongoing since her prior abdominal surgery.  According to the records she had laparoscopic lysis of adhesions in March of this year.  Patient states she has been having issues with persistent abdominal discomfort.  She has had episodes of nausea and vomiting.  She feels like her abdomen has become more swollen.  She hurts diffusely throughout her abdomen.  Patient states she was at her doctor's office today and instructed to come to the ED for evaluation.  Prior records reviewed.  Patient was in the emergency room on June 23.  She did have a CT scan at that time that showed evidence of stable rectus diastases but no other acute changes.  Past Medical History:  Diagnosis Date   Anxiety    Arthritis    osteoarthritis    Asthma    has been eval. by Dr. Gwenette Greet- last 06/2014   Bipolar 1 disorder ([place])    Complication of anesthesia    09/13/14: malignant HTN with inducction, case cx but no sourse identified; 11/05/14 remifentanyl infusion used to attenuate hypergynamic induction   Depression    Diabetes mellitus    GERD (gastroesophageal reflux disease)    Hyperlipidemia    Hypertension    Knee pain, chronic    OSA on CPAP    Pain in joint, ankle and foot 02/17/2013   Pneumonia    hosp. for a couple of day- not sure when it was    PTSD (post-traumatic stress disorder)    Rotator cuff tear    bicep tendon    Shortness of breath dyspnea    Sleep apnea    uses CPAP intermittently, pt. doesn't remember where she had the study    Patient Active Problem List   Diagnosis Date Noted   Chronic obstructive pulmonary disease  ([place]) 09/15/2020   Deep venous thrombosis of peroneal vein ([place]) 09/15/2020   Dysphagia 09/15/2020   Excessive and frequent menstruation 09/15/2020   Gastro-esophageal reflux disease without esophagitis 09/15/2020   Hyperglycemia due to type 2 diabetes mellitus ([place]) 09/15/2020   Hypertensive heart disease without congestive heart failure 09/15/2020   Long term (current) use of insulin ([place]) 09/15/2020   Moderate persistent asthma, uncomplicated 32/44/0102   Primary osteoarthritis, unspecified ankle and foot 09/15/2020   Diabetic polyneuropathy associated with type 2 diabetes mellitus ([place]) 08/25/2019   Plantar fasciitis, left 08/25/2019   Tendonitis of ankle or foot 08/25/2019   Tight heel cords, acquired, bilateral 08/25/2019   Dyslipidemia 10/22/2016   Left rotator cuff tear 07/31/2016   Diabetes mellitus, insulin dependent (IDDM), uncontrolled 09/18/2015   AKI (acute kidney injury) ([place]) 09/18/2015   Morbid obesity ([place]) 09/18/2015   Asthma 09/18/2015   Uncontrolled type 2 diabetes mellitus with complication (HCC)    Chronic diastolic CHF (congestive heart failure), NYHA class 1 ([place])    PTSD (post-traumatic stress disorder)    Bipolar I disorder ([place])    Anxiety state    Knee pain, chronic    Cervical spondylosis with myelopathy 11/05/2014   HTN (hypertension), malignant 09/08/2014   HNP (herniated nucleus pulposus) with myelopathy, cervical 06/29/2014  Deformity of metatarsal bone of right foot 06/28/2014   CTS (carpal tunnel syndrome) 05/04/2014   OSA (obstructive sleep apnea) 05/04/2014   Hereditary and idiopathic peripheral neuropathy 05/04/2014   Ganglion cyst 03/10/2014   Vision loss, left eye 02/18/2014   Conjunctivitis 02/17/2014   Asthma exacerbation 01/13/2014   Abdominal pain, unspecified site 10/28/2013   Injury of foot, right, superficial 10/23/2013   Tenosynovitis of foot 08/17/2013   Pain in joint, ankle and foot 02/17/2013   Edema of foot 02/17/2013    Status post foot surgery 09/09/2012   Hyperlipidemia    Major depressive disorder, recurrent, 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/25/2012    Past Surgical History:  Procedure Laterality Date   ANTERIOR CERVICAL DECOMP/DISCECTOMY FUSION N/A 11/05/2014   Procedure: ANTERIOR CERVICAL DECOMPRESSION/DISCECTOMY FUSION 1 LEVEL;  Surgeon: Consuella Lose, MD;  Location: [facility];  Service: Neurosurgery;  Laterality: N/A;  Cervical five- cervical seven anterior cervical decompression with fusion interbody prosthesis plating and bonegraft   CARPAL TUNNEL RELEASE Left 2001   Left wrist   CERVICAL LAMINECTOMY  03/31/20176   CERVICAL 4 SEVEN LAMINECTOMIES WITH LATERAL FUSION   [place] Osteotomy w/ Bone Graft Right 09/30/2014   Rt foot @ [facility]   GASTROC RECESSION EXTREMITY Right 3//20/14   Right foot   HERNIA REPAIR     1996   LAPAROSCOPY N/A 08/23/2020   Procedure: LAPAROSCOPIC LYSIS OF ADHESIONS AND OPEN REMOVAL OF ABDOMINAL WALL MESH;  Surgeon: Clovis Riley, MD;  Location: [facility];  Service: General;  Laterality: N/A;  90   Lapidus fusion Right 01/10/12   Rt foot   MIDTARSAL ARTHRODESIS Right 07/17/12   Rt foot   POSTERIOR CERVICAL FUSION/FORAMINOTOMY N/A 09/16/2015   Procedure: Cervical four - seven laminectomies with lateral mass fusion;  Surgeon: Consuella Lose, MD;  Location: [facility];  Service: Neurosurgery;  Laterality: N/A;  C4-7 laminectomy with lateral mass fusion   Remove implant deep Right 07/17/12   Rt foot   Remove Implant Deep Right 07/22/2014   Rt foot @ [facility]   SHOULDER ARTHROSCOPY WITH ROTATOR CUFF REPAIR AND SUBACROMIAL DECOMPRESSION Left 07/31/2016   Procedure: LEFT SHOULDER ARTHROSCOPY WITH ROTATOR CUFF REPAIR;  Surgeon: Renette Butters, MD;  Location: [facility];  Service: Orthopedics;  Laterality: Left;   TUBAL LIGATION       OB History   No obstetric history on file.     Family History  Problem Relation Age of Onset   Kidney disease Mother    Heart attack Father      Social History   Tobacco Use   Smoking status: Former    Packs/day: 2.00    Years: 6.00    Pack years: 12.00    Types: Cigarettes    Quit date: 06/18/1978    Years since quitting: 42.5   Smokeless tobacco: Never  Vaping Use   Vaping Use: Never used  Substance Use Topics   Alcohol use: Not Currently   Drug use: No    Home Medications Prior to Admission medications   Medication Sig Start Date End Date Taking? Authorizing Provider  amLODipine (NORVASC) 10 MG tablet Take 10 mg by mouth daily.  01/20/16   [provider]  atenolol-chlorthalidone (TENORETIC) 100-25 MG tablet Take 1 tablet by mouth daily.    [provider]  BD PEN NEEDLE NANO 2ND GEN 32G X 4 MM MISC See admin instructions. 06/27/20   [provider]  bisacodyl (DULCOLAX) 10 MG suppository Place 10 mg rectally  daily as needed for mild constipation. 09/09/20   [provider]  budesonide-formoterol (SYMBICORT) 160-4.5 MCG/ACT inhaler Inhale 2 puffs into the lungs 2 (two) times daily. Patient taking differently: Inhale 2 puffs into the lungs 2 (two) times daily as needed (wheezing). 05/24/16   Parrett, Fonnie Mu, NP  capsaicin (ZOSTRIX) 0.025 % cream Apply topically 2 (two) times daily. Patient not taking: Reported on 12/07/2020 07/26/20   Charlesetta Shanks, MD  cyclobenzaprine (FLEXERIL) 10 MG tablet Take 10 mg by mouth every 12 (twelve) hours as needed for muscle spasms. 07/30/19   [provider]  diazepam (VALIUM) 2 MG tablet Take 1 tablet (2 mg total) by mouth every 6 (six) hours as needed for muscle spasms. 08/18/18   Nita Sells, MD  DULoxetine (CYMBALTA) 30 MG capsule Take 30 mg by mouth daily.    [provider]  glucose blood (ACCU-CHEK AVIVA PLUS) test strip TEST FOUR TIMES DAILY 07/31/19   [provider]  hydrOXYzine (ATARAX/VISTARIL) 25 MG tablet Take 25 mg by mouth at bedtime. 07/31/19   [provider]  LEVEMIR FLEXTOUCH 100 UNIT/ML FlexPen  Inject 10 Units into the skin 2 (two) times daily. 06/27/20   [provider]  mirtazapine (REMERON) 15 MG tablet Take 15 mg by mouth at bedtime. 08/11/18   [provider]  ondansetron (ZOFRAN ODT) 4 MG disintegrating tablet Take 1 tablet (4 mg total) by mouth every 8 (eight) hours as needed for nausea or vomiting. 12/08/20   Tacy Learn, PA-C  oxyCODONE (ROXICODONE) 15 MG immediate release tablet Take 15 mg by mouth every 6 (six) hours as needed for pain. 07/30/19   [provider]  VENTOLIN HFA 108 (90 Base) MCG/ACT inhaler INHALE 1 TO 2 PUFFS INTO THE LUNGS EVERY 6 (SIX) HOURS AS NEEDED FOR WHEEZING OR SHORTNESS OF BREATH. Patient taking differently: Inhale 1-2 puffs into the lungs every 6 (six) hours as needed for wheezing or shortness of breath. 10/11/16   Rigoberto Noel, MD  VICTOZA 18 MG/3ML SOPN Inject 0.6 mg into the skin daily. 07/01/20   [provider]    Allergies    Celery (apium graveolens var. dulce) skin test, Celery oil, Pork-derived products, Latex, Other, Penicillins, and Tape  Review of Systems   Review of Systems  Gastrointestinal:  Positive for abdominal pain.  All other systems reviewed and are negative.  Physical Exam Updated Vital Signs BP (!) 145/80 (BP Location: Left Arm)   Pulse 95   Temp 98.3 F (36.8 C) (Oral)   Resp 18   Ht 1.651 m (5\' 5" )   Wt 93 kg   LMP 08/13/2019   SpO2 97%   BMI 34.11 kg/m   Physical Exam Vitals and nursing note reviewed.  Constitutional:      General: She is not in acute distress.    Appearance: She is well-developed.  HENT:     Head: Normocephalic and atraumatic.     Right Ear: External ear normal.     Left Ear: External ear normal.  Eyes:     General: No scleral icterus.       Right eye: No discharge.        Left eye: No discharge.     Conjunctiva/sclera: Conjunctivae normal.  Neck:     Trachea: No tracheal deviation.  Cardiovascular:     Rate and Rhythm: Normal rate and regular  rhythm.  Pulmonary:     Effort: Pulmonary effort is normal. No respiratory distress.  Breath sounds: Normal breath sounds. No stridor. No wheezing or rales.  Abdominal:     General: Bowel sounds are normal. There is no distension.     Palpations: Abdomen is soft.     Tenderness: There is generalized abdominal tenderness. There is no guarding or rebound.     Hernia: A hernia is present. Hernia is present in the ventral area.     Comments: Hernia soft, easily reduced  Musculoskeletal:        General: No tenderness or deformity.     Cervical back: Neck supple.  Skin:    General: Skin is warm and dry.     Findings: No rash.  Neurological:     General: No focal deficit present.     Mental Status: She is alert.     Cranial Nerves: No cranial nerve deficit (no facial droop, extraocular movements intact, no slurred speech).     Sensory: No sensory deficit.     Motor: No abnormal muscle tone or seizure activity.     Coordination: Coordination normal.  Psychiatric:        Mood and Affect: Mood normal.    ED Results / Procedures / Treatments   Labs (all labs ordered are listed, but only abnormal results are displayed) Labs Reviewed  COMPREHENSIVE METABOLIC PANEL - Abnormal; Notable for the following components:      Result Value   Glucose, Bld 149 (*)    BUN 5 (*)    All other components within normal limits  URINALYSIS, ROUTINE W REFLEX MICROSCOPIC - Abnormal; Notable for the following components:   APPearance CLOUDY (*)    All other components within normal limits  LIPASE, BLOOD  CBC WITH DIFFERENTIAL/PLATELET  PREGNANCY, URINE    EKG None  Radiology CT ABDOMEN PELVIS W CONTRAST  Result Date: 12/30/2020 CLINICAL DATA:  Recurrence abdominal pain for 6 months EXAM: CT ABDOMEN AND PELVIS WITH CONTRAST TECHNIQUE: Multidetector CT imaging of the abdomen and pelvis was performed using the standard protocol following bolus administration of intravenous contrast. CONTRAST:  118mL  OMNIPAQUE IOHEXOL 300 MG/ML  SOLN COMPARISON:  12/26/2020, 12/08/2020, 11/06/2020 FINDINGS: Lower chest: No acute pleural or parenchymal lung disease. Hepatobiliary: No focal liver abnormality is seen. No gallstones, gallbladder wall thickening, or biliary dilatation. Pancreas: Unremarkable. No pancreatic ductal dilatation or surrounding inflammatory changes. Spleen: Normal in size without focal abnormality. Adrenals/Urinary Tract: Adrenal glands are unremarkable. Kidneys are normal, without renal calculi, focal lesion, or hydronephrosis. Bladder is unremarkable. Stomach/Bowel: No bowel obstruction or ileus. Normal appendix right mid abdomen. No bowel wall thickening or inflammatory change. Vascular/Lymphatic: No significant vascular findings are present. No enlarged abdominal or pelvic lymph nodes. Reproductive: Stable 2.3 cm exophytic fibroid off the left lateral aspect of the lower uterine segment. No adnexal masses. Other: No free fluid or free gas. Stable midline ventral hernia at the level of the umbilicus with a portion of small bowel protruding through the hernia defect. No evidence of bowel incarceration or obstruction. Musculoskeletal: No acute or destructive bony lesions. 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 Reconstructed images demonstrate no additional findings. IMPRESSION: 1. Stable midline ventral hernia containing a small portion of small bowel. No evidence of bowel incarceration or obstruction. 2. No acute intra-abdominal or intrapelvic process. 3. Stable fibroid uterus. 4. Stable left femoral head avascular necrosis. Electronically Signed   By: Randa Ngo M.D.   On: 12/30/2020 19:07    Procedures Procedures   Medications  Ordered in ED Medications  morphine 4 MG/ML injection 4 mg (4 mg Intravenous Given 12/30/20 1649)  ondansetron (ZOFRAN) injection 4 mg (4 mg Intravenous Given 12/30/20 1649)  iohexol (OMNIPAQUE) 300  MG/ML solution 100 mL (100 mLs Intravenous Contrast Given 12/30/20 1808)    ED Course  I have reviewed the triage vital signs and the nursing notes.  Pertinent labs & imaging results that were available during my care of the patient were reviewed by me and considered in my medical decision making (see chart for details).  Clinical Course as of 12/30/20 1944  Fri Dec 30, 2020  1711 Labs reviewed.  CBC and metabolic panel are normal.  Lipase and UA normal [JK]  1910 IMPRESSION: 1. Stable midline ventral hernia containing a small portion of small bowel. No evidence of bowel incarceration or obstruction. 2. No acute intra-abdominal or intrapelvic process. 3. Stable fibroid uterus. 4. Stable left femoral head avascular necrosis.   [JK]    Clinical Course User Index [JK] Dorie Rank, MD   MDM Rules/Calculators/A&P                          Patient presented to ED with complaints of persistent abdominal pain.  Sounds like she has been having symptoms since her prior abdominal surgery several months ago.  Patient does have evidence of ventral hernia on exam.  It is soft and easily reduced.  CT scan does show the ventral hernia but it is unchanged in size from previous imaging test.  Patient seems to be having more diffuse symptoms.  Unclear if this is the source of all of her pain but she does not have any evidence of acute complications associated with her hernia.  Reasonable for her to follow-up with her GI doctor as planned.  Also reasonable to have her follow-up with general surgery regarding the ventral hernia. Final Clinical Impression(s) / ED Diagnoses Final diagnoses:  Ventral hernia without obstruction or gangrene    Rx / DC Orders ED Discharge Orders     None        Dorie Rank, MD 12/30/20 1945

## 2020-12-31 DIAGNOSIS — E119 Type 2 diabetes mellitus without complications: Secondary | ICD-10-CM | POA: Diagnosis not present

## 2021-01-01 DIAGNOSIS — E119 Type 2 diabetes mellitus without complications: Secondary | ICD-10-CM | POA: Diagnosis not present

## 2021-01-02 DIAGNOSIS — E119 Type 2 diabetes mellitus without complications: Secondary | ICD-10-CM | POA: Diagnosis not present

## 2021-01-03 DIAGNOSIS — E119 Type 2 diabetes mellitus without complications: Secondary | ICD-10-CM | POA: Diagnosis not present

## 2021-01-04 DIAGNOSIS — E119 Type 2 diabetes mellitus without complications: Secondary | ICD-10-CM | POA: Diagnosis not present

## 2021-01-05 DIAGNOSIS — M79672 Pain in left foot: Secondary | ICD-10-CM | POA: Diagnosis not present

## 2021-01-05 DIAGNOSIS — F1120 Opioid dependence, uncomplicated: Secondary | ICD-10-CM | POA: Diagnosis not present

## 2021-01-05 DIAGNOSIS — M79642 Pain in left hand: Secondary | ICD-10-CM | POA: Diagnosis not present

## 2021-01-05 DIAGNOSIS — G8929 Other chronic pain: Secondary | ICD-10-CM | POA: Diagnosis not present

## 2021-01-05 DIAGNOSIS — M25551 Pain in right hip: Secondary | ICD-10-CM | POA: Diagnosis not present

## 2021-01-05 DIAGNOSIS — M5417 Radiculopathy, lumbosacral region: Secondary | ICD-10-CM | POA: Diagnosis not present

## 2021-01-05 DIAGNOSIS — E119 Type 2 diabetes mellitus without complications: Secondary | ICD-10-CM | POA: Diagnosis not present

## 2021-01-05 DIAGNOSIS — G894 Chronic pain syndrome: Secondary | ICD-10-CM | POA: Diagnosis not present

## 2021-01-05 DIAGNOSIS — I10 Essential (primary) hypertension: Secondary | ICD-10-CM | POA: Diagnosis not present

## 2021-01-05 DIAGNOSIS — M79641 Pain in right hand: Secondary | ICD-10-CM | POA: Diagnosis not present

## 2021-01-05 DIAGNOSIS — M25512 Pain in left shoulder: Secondary | ICD-10-CM | POA: Diagnosis not present

## 2021-01-05 DIAGNOSIS — M79671 Pain in right foot: Secondary | ICD-10-CM | POA: Diagnosis not present

## 2021-01-05 DIAGNOSIS — M545 Low back pain, unspecified: Secondary | ICD-10-CM | POA: Diagnosis not present

## 2021-01-05 DIAGNOSIS — M542 Cervicalgia: Secondary | ICD-10-CM | POA: Diagnosis not present

## 2021-01-05 DIAGNOSIS — M25511 Pain in right shoulder: Secondary | ICD-10-CM | POA: Diagnosis not present

## 2021-01-05 DIAGNOSIS — Z79891 Long term (current) use of opiate analgesic: Secondary | ICD-10-CM | POA: Diagnosis not present

## 2021-01-06 DIAGNOSIS — E119 Type 2 diabetes mellitus without complications: Secondary | ICD-10-CM | POA: Diagnosis not present

## 2021-01-07 DIAGNOSIS — E119 Type 2 diabetes mellitus without complications: Secondary | ICD-10-CM | POA: Diagnosis not present

## 2021-01-07 DIAGNOSIS — G894 Chronic pain syndrome: Secondary | ICD-10-CM | POA: Diagnosis not present

## 2021-01-08 DIAGNOSIS — E119 Type 2 diabetes mellitus without complications: Secondary | ICD-10-CM | POA: Diagnosis not present

## 2021-01-09 ENCOUNTER — Other Ambulatory Visit: Payer: Self-pay | Admitting: *Deleted

## 2021-01-09 ENCOUNTER — Encounter: Payer: Self-pay | Admitting: *Deleted

## 2021-01-09 ENCOUNTER — Ambulatory Visit (INDEPENDENT_AMBULATORY_CARE_PROVIDER_SITE_OTHER): Payer: Medicare HMO | Admitting: Podiatry

## 2021-01-09 ENCOUNTER — Other Ambulatory Visit: Payer: Self-pay

## 2021-01-09 DIAGNOSIS — E1142 Type 2 diabetes mellitus with diabetic polyneuropathy: Secondary | ICD-10-CM

## 2021-01-09 DIAGNOSIS — E1169 Type 2 diabetes mellitus with other specified complication: Secondary | ICD-10-CM | POA: Diagnosis not present

## 2021-01-09 DIAGNOSIS — M216X2 Other acquired deformities of left foot: Secondary | ICD-10-CM | POA: Diagnosis not present

## 2021-01-09 DIAGNOSIS — E119 Type 2 diabetes mellitus without complications: Secondary | ICD-10-CM | POA: Diagnosis not present

## 2021-01-09 DIAGNOSIS — M8700 Idiopathic aseptic necrosis of unspecified bone: Secondary | ICD-10-CM | POA: Insufficient documentation

## 2021-01-09 DIAGNOSIS — B351 Tinea unguium: Secondary | ICD-10-CM | POA: Diagnosis not present

## 2021-01-09 DIAGNOSIS — M216X1 Other acquired deformities of right foot: Secondary | ICD-10-CM | POA: Diagnosis not present

## 2021-01-09 NOTE — Patient Outreach (Addendum)
[facility]) Care Management  01/09/2021  Diana Henderson May 27, 1971 [id number]  Telephone Assessment/Screen for [employer] referral   Referral Date: 12/02/20 Referral Source: Holland Falling MA Referral Reason: [facility] complex care-Tier 4/ Aet[employer] MA 5 ED visits, DM, COPD/Asthma, HTN Insurance: [employer] medicare   Admission/ED  Admission 08/23/20 laparoscopic lysis of adhesion and open removal of abdominal wall mesh ED x 12- 12/30/20- abdominal pain ventral hernia 12/08/20 non intractable vomiting  12/07/20 left leg weakness 11/05/20 constipation 10/12/20 abdominal pain 09/26/20 constipation 08/10/20 dehydration 08/05/20, 08/04/20 & 07/26/20 abdominal pain 07/19/20 various complaints HTN, hernia insulin use soft tissue disorders 07/18/20 abdominal pain  [name] returned a call to RN CM  Patient is able to verify HIPAA ([place] and Accountability Act) identifiers Reviewed and addressed the purpose of the follow up call with the patient  Consent: [facility] ([place]) RN CM reviewed [facility] services with patient. Patient gave verbal consent for services.  Assessment  Ms Diana Henderson is a 50 year old disabled female  She confirms she recently had an insurance coverage change to [facility] in the last year She recently moved into her section 8 split level apartment in 2021 (bedrooms upstairs)  She reports difficulty with mobility to get up and down the stairs  She reports staying in bed most of the day and clustering activities and care needs to allow this She reports she would prefer to have a one level apartment but will not be able to change her lease for a year She today is at her daughter's as she is needing assistance with iADLs after she fell recently and acquired a hip injury Her daughter assists her with transportation to medical appointments, food, and picking up her prescriptions  Diabetes  She reports not having a cbg nor BP monitor at her apartment since  the change of coverage  She reports having an accu chek but states it is not covered now by [employer] CM educated her about insurance carriers preference of certain monitors Referred her to primary care provider (PCP) for assist with order or prescription to take to her local preferred pharmacy to have processed  [place] [address] and to have hip surgery pending MRI on 01/18/21 per pt Uses a cane now but is preferring a walker. Reports she had a walker in the last 1-3 years She does have a wheelchair (w/c) but had inquiries about how a scooter may be obtained RN CM answered questions about scooter, medicare guidelines, assessment, or PT  Hypertension She states her last recalled BP was 177/110  saw pcp Friday 01/06/21 for evaluation Reports she takes medicines as ordered  Chronic pain Goes [facility] pain clinic [facility]  Dr Consuella Lose in [place] seen on December 09, 2020, and December 28, 2020  Weight loss with history of obesity She reports losing weight  She reports she was 298 lbs. at 5'5" but is now 205 lbs. and "I am still losing weight" She reports the weight loss was initiated after her March 8,2022 open removal of abdominal wall mesh with laparoscopic lysis of adhesions She reports since 08/23/20 she has frequent constipation and emesis resulting in a hernia (developed from straining) She reports she is scheduled for an upcoming colonoscopy   RN CM reviewed Neuropathy and body mass index Discuss the importance her weight loss will have on her diabetes, hypertension management   Behavioral health She is seen at [facility] and the last telehealth visit was on July 11, 2020  Upcoming Appointments 01/25/21 [facility] gastroenterology initial consult 01/31/21 1100 & 03/10/21 1000 pcp Raelyn Number  Patient Active Problem List   Diagnosis Date Noted   Aseptic necrosis of bone ([place]) 01/09/2021   Chronic obstructive pulmonary disease ([place]) 09/15/2020   Deep venous  thrombosis of peroneal vein ([place]) 09/15/2020   Dysphagia 09/15/2020   Excessive and frequent menstruation 09/15/2020   Gastro-esophageal reflux disease without esophagitis 09/15/2020   Hyperglycemia due to type 2 diabetes mellitus ([place]) 09/15/2020   Hypertensive heart disease without congestive heart failure 09/15/2020   Long term (current) use of insulin ([place]) 09/15/2020   Moderate persistent asthma, uncomplicated Q000111Q   Primary osteoarthritis, unspecified ankle and foot 09/15/2020   Other chronic pain 07/25/2020   Chronic low back pain 07/25/2020   Body mass index (BMI) 40.0-44.9, adult ([place]) 06/29/2020   Diabetic polyneuropathy associated with type 2 diabetes mellitus ([place]) 08/25/2019   Plantar fasciitis, left 08/25/2019   Tendonitis of ankle or foot 08/25/2019   Tight heel cords, acquired, bilateral 08/25/2019   Physical deconditioning 02/03/2018   Neck pain 10/16/2017   Dyslipidemia 10/22/2016   Left rotator cuff tear 07/31/2016   Diabetes mellitus, insulin dependent (IDDM), uncontrolled 09/18/2015   AKI (acute kidney injury) ([place]) 09/18/2015   Morbid obesity ([place]) 09/18/2015   Asthma 09/18/2015   Uncontrolled type 2 diabetes mellitus with complication (HCC)    Chronic diastolic CHF (congestive heart failure), NYHA class 1 ([place])    PTSD (post-traumatic stress disorder)    Bipolar I disorder ([place])    Anxiety state    Knee pain, chronic    HTN (hypertension), malignant 09/08/2014   Cervical spondylosis with myelopathy 07/07/2014   HNP (herniated nucleus pulposus) with myelopathy, cervical 06/29/2014   Deformity of metatarsal bone of right foot 06/28/2014   CTS (carpal tunnel syndrome) 05/04/2014   OSA (obstructive sleep apnea) 05/04/2014   Hereditary and idiopathic peripheral neuropathy 05/04/2014   Ganglion cyst 03/10/2014   Vision loss, left eye 02/18/2014   Conjunctivitis 02/17/2014   Asthma exacerbation 01/13/2014   Abdominal pain, unspecified site 10/28/2013    Injury of foot, right, superficial 10/23/2013   Tenosynovitis of foot 08/17/2013   Pain in joint, ankle and foot 02/17/2013   Edema of foot 02/17/2013   Status post foot surgery 09/09/2012   Hyperlipidemia    Major depressive disorder, recurrent, 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[place]) 02/25/2012     Plan Patient agrees to care plan and follow up within the next 30 business days. Pt encouraged to return a call to [facility] RN CM prn   Goals Addressed               This Visit's Progress     Patient Stated     Cope with Chronic Pain (THN) (pt-stated)   On track     Timeframe:  Long-Range Goal Priority:  Low Start Date:                01/09/21             Expected End Date:   03/17/21                    Follow Up Date 01/17/21    - attend at least 90 percent of counseling appointments      Notes:  01/09/21 reports she has pain management doctor, reports low back neuropathy pain from hip injury       Find Help in My Community(THN) (DME) (pt-stated)   Not on  track     Timeframe:  Short-Term Goal Priority:  High Start Date:              01/09/21                Expected End Date:                      Barriers: Knowledge  Follow Up Date 01/17/21   - begin a notebook of services in my neighborhood or community - call 211 when I need some help - follow-up on any referrals for help I am given - make a list of family or friends that I can call      Notes:  01/09/21 reports need of cbg & BP monitors, walker ? scooter      Monitor and Manage My Blood Sugar-Diabetes Type 2 (THN) (pt-stated)   Not on track     Timeframe:  Long-Range Goal Priority:  Medium Start Date:               01/09/21              Expected End Date:       04/14/21                Follow Up Date 01/17/21   - check blood sugar at prescribed times    Notes:  01/09/21 reports discrepancies with availability of home monitoring device Had Accu chek reporting not covered by [place]. Lavina Hamman, RN, BSN,  [place] Coordinator Office number [phone] Main [facility] number [phone] Fax number [phone]

## 2021-01-09 NOTE — Progress Notes (Signed)
  Subjective:  Patient ID: [name], female    DOB: 1971-05-20,  MRN: [id number]  No chief complaint on file.  50 y.o. female presents with the above complaint. History confirmed with patient. Has had other medical issues and not been back for care.  Objective:  Physical Exam: warm, good capillary refill, no trophic changes or ulcerative lesions, normal DP and PT pulses and reduced sensory exam.with LOPS. Nails dystrophic and elongated.  Assessment:   1. Onychomycosis of multiple toenails with type 2 diabetes mellitus and peripheral neuropathy ([place])    Plan:  Patient was evaluated and treated and all questions answered.   Onychomycosis, DM -Nails debrided x10 -Dispense DM shoes and inserts. Written and verbal instructions on break in given to patient, patient signed forms confirming receipt.   Procedure: Nail Debridement Type of Debridement: manual, sharp debridement. Instrumentation: Nail nipper, rotary burr. Number of Nails: 10    No follow-ups on file.

## 2021-01-09 NOTE — Patient Outreach (Signed)
[place] [facility]) Care Management  01/09/2021  Kippie Rimmer 1970/09/12 [id number]   [facility] third call attempt for Telephone Assessment/Screen for [employer] referral   Referral Date: 12/02/20 Referral Source: Aetna MA Referral Reason: [facility] complex care-Tier 4/ Aet[employer] MA 5 ED visits, DM, COPD/Asthma, HTN Insurance: aetna medicare   Patient Active Problem List   Diagnosis Date Noted   Chronic obstructive pulmonary disease ([place]) 09/15/2020   Deep venous thrombosis of peroneal vein ([place]) 09/15/2020   Dysphagia 09/15/2020   Excessive and frequent menstruation 09/15/2020   Gastro-esophageal reflux disease without esophagitis 09/15/2020   Hyperglycemia due to type 2 diabetes mellitus ([place]) 09/15/2020   Hypertensive heart disease without congestive heart failure 09/15/2020   Long term (current) use of insulin ([place]) 09/15/2020   Moderate persistent asthma, uncomplicated Q000111Q   Primary osteoarthritis, unspecified ankle and foot 09/15/2020   Diabetic polyneuropathy associated with type 2 diabetes mellitus ([place]) 08/25/2019   Plantar fasciitis, left 08/25/2019   Tendonitis of ankle or foot 08/25/2019   Tight heel cords, acquired, bilateral 08/25/2019   Dyslipidemia 10/22/2016   Left rotator cuff tear 07/31/2016   Diabetes mellitus, insulin dependent (IDDM), uncontrolled 09/18/2015   AKI (acute kidney injury) ([place]) 09/18/2015   Morbid obesity ([place]) 09/18/2015   Asthma 09/18/2015   Uncontrolled type 2 diabetes mellitus with complication (HCC)    Chronic diastolic CHF (congestive heart failure), NYHA class 1 ([place])    PTSD (post-traumatic stress disorder)    Bipolar I disorder ([place])    Anxiety state    Knee pain, chronic    Cervical spondylosis with myelopathy 11/05/2014   HTN (hypertension), malignant 09/08/2014   HNP (herniated nucleus pulposus) with myelopathy, cervical 06/29/2014   Deformity of metatarsal bone of right foot 06/28/2014   CTS (carpal tunnel syndrome)  05/04/2014   OSA (obstructive sleep apnea) 05/04/2014   Hereditary and idiopathic peripheral neuropathy 05/04/2014   Ganglion cyst 03/10/2014   Vision loss, left eye 02/18/2014   Conjunctivitis 02/17/2014   Asthma exacerbation 01/13/2014   Abdominal pain, unspecified site 10/28/2013   Injury of foot, right, superficial 10/23/2013   Tenosynovitis of foot 08/17/2013   Pain in joint, ankle and foot 02/17/2013   Edema of foot 02/17/2013   Status post foot surgery 09/09/2012   Hyperlipidemia    Major depressive disorder, recurrent, 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[place]) 02/25/2012   Outreach attempt to the home number [phone] No answer. THN RN CM left HIPAA [facility] Portability and Accountability Act) compliant voicemail message along with CM's contact info  Plan: [facility] RN CM Unsuccessful outreach letter sent on 12/21/20 Unsuccessful outreach on 12/21/20,12/28/20   Joelene Millin L. Lavina Hamman, RN, BSN, [place] Coordinator Office number [phone] Mobile number [phone] Main THN number [phone] Fax number [phone]

## 2021-01-10 DIAGNOSIS — E119 Type 2 diabetes mellitus without complications: Secondary | ICD-10-CM | POA: Diagnosis not present

## 2021-01-10 DIAGNOSIS — M25552 Pain in left hip: Secondary | ICD-10-CM | POA: Diagnosis not present

## 2021-01-11 ENCOUNTER — Other Ambulatory Visit: Payer: Self-pay | Admitting: *Deleted

## 2021-01-11 DIAGNOSIS — E119 Type 2 diabetes mellitus without complications: Secondary | ICD-10-CM | POA: Diagnosis not present

## 2021-01-11 NOTE — Patient Outreach (Signed)
[place] [facility]) Care Management  01/11/2021  Diana Henderson [birth year] [id number]   [place] coordination- collaboration with primary care provider (PCP) & patient   Diana Henderson was referred to [facility] on 12/02/20 by Holland Falling MA Referral Reason: [facility] complex care-Tier 4/ [employer] MA 5 ED visits, DM, COPD/Asthma, HTN Insurance: Aetna medicare    Admission/ED  Admission 08/23/20 laparoscopic lysis of adhesion and open removal of abdominal wall mesh ED x 12- 12/30/20- abdominal pain ventral hernia 12/08/20 non intractable vomiting 12/07/20 left leg weakness 11/05/20 constipation 10/12/20 abdominal pain 09/26/20 constipation 08/10/20 dehydration 08/05/20, 08/04/20 & 07/26/20 abdominal pain 07/19/20 various complaints HTN, hernia insulin use soft tissue disorders 07/18/20 abdominal pain     Care coordination Outreach to pcp office [phone] to inquire of assistance for orders for cbg monitor covered by Holland Falling and BP cuff to be sent to her local pharmacy Also request assistance with order for a walker   Spoke with Sharee Pimple who discussed a tele visit would need be scheduled with the pcp Coordinated getting patient on the phone to conference with Sharee Pimple to schedule the tele-visit for 01/12/21 at 1640 Pt confirms she would want the call after her daughter arrives home after 3 pm Pt confirmed pt understand she would need to discuss her need for BP cuff, walker and cbg Monitor during this outreach She voiced understanding  Further outreach with pt as she shared that she had a MRI completed on the evening of 01/10/21 and is pending a 01/16/21 4 pm visit for review the results prior to being scheduled for any further medical procedure.She shared that she went for a colonoscopy today but it was rescheduled as her colon was not cleared, she had an elevated heart rate and "they told me I have a lot going on with my body" EPIC indicates colonoscopy rescheduled with [name], 03/13/21 for 2:30  PM,    Plan Patient agrees to care plan and follow up within the next 30 business days   Goals Addressed               This Visit's Progress     Patient Stated     Find Help in My Community(THN) (DME) (pt-stated)   On track     Timeframe:  Short-Term Goal Priority:  High Start Date:              01/09/21                Expected End Date:                      Barriers: Knowledge  Follow Up Date 01/17/21   - begin a notebook of services in my neighborhood or community - call 211 when I need some help - follow-up on any referrals for help I am given - make a list of family or friends that I can call     Notes:   01/11/21 Agreed to conference call with pcp to schedule tele-visit to be assessed for walker, BP cuff and cbg monitor Updated RN CM r/t colonoscopy, MRI upcoming appointments 01/09/21 reports need of cbg & BP monitors, walker ? scooter      Monitor and Manage My Blood Sugar-Diabetes Type 2 (THN) (pt-stated)   On track     Timeframe:  Long-Range Goal Priority:  Medium Start Date:               01/09/21  Expected End Date:       04/14/21                Follow Up Date 01/17/21   - check blood sugar at prescribed times     Notes:  01/11/21 Agreed to conference call with pcp to schedule tele-visit to be assessed for walker, BP cuff and cbg monitor Updated RN CM r/t colonoscopy, MRI upcoming appointments 01/09/21 reports discrepancies with availability of home monitoring device Had Accu chek reporting not covered by [place]. Lavina Hamman, RN, BSN, [place] Coordinator Office number [phone] Mobile number [phone]  Main THN number [phone] Fax number [phone]

## 2021-01-12 DIAGNOSIS — E119 Type 2 diabetes mellitus without complications: Secondary | ICD-10-CM | POA: Diagnosis not present

## 2021-01-13 DIAGNOSIS — E119 Type 2 diabetes mellitus without complications: Secondary | ICD-10-CM | POA: Diagnosis not present

## 2021-01-14 DIAGNOSIS — E119 Type 2 diabetes mellitus without complications: Secondary | ICD-10-CM | POA: Diagnosis not present

## 2021-01-15 DIAGNOSIS — E119 Type 2 diabetes mellitus without complications: Secondary | ICD-10-CM | POA: Diagnosis not present

## 2021-01-16 DIAGNOSIS — M87852 Other osteonecrosis, left femur: Secondary | ICD-10-CM | POA: Diagnosis not present

## 2021-01-16 DIAGNOSIS — E119 Type 2 diabetes mellitus without complications: Secondary | ICD-10-CM | POA: Diagnosis not present

## 2021-01-16 DIAGNOSIS — M25552 Pain in left hip: Secondary | ICD-10-CM | POA: Diagnosis not present

## 2021-01-17 ENCOUNTER — Other Ambulatory Visit: Payer: Self-pay

## 2021-01-17 ENCOUNTER — Other Ambulatory Visit: Payer: Self-pay | Admitting: *Deleted

## 2021-01-17 DIAGNOSIS — E119 Type 2 diabetes mellitus without complications: Secondary | ICD-10-CM | POA: Diagnosis not present

## 2021-01-17 NOTE — Patient Outreach (Addendum)
[place] [facility]) Care Management  01/17/2021  Kynadie Arnoux [birth year] [id number]    THN follow up with complex care patient  Ms Cenedra Sperle was referred to [facility] on 12/02/20 by Holland Falling MA Referral Reason: [facility] complex care-Tier 4/ [employer]et[employer] MA 5 ED visits, DM, COPD/Asthma, HTN Insurance: Aetna medicare    Admission/ED  Admission 08/23/20 laparoscopic lysis of adhesion and open removal of abdominal wall mesh ED x 12- 12/30/20- abdominal pain ventral hernia 12/08/20 non intractable vomiting 12/07/20 left leg weakness 11/05/20 constipation 10/12/20 abdominal pain 09/26/20 constipation 08/10/20 dehydration 08/05/20, 08/04/20 & 07/26/20 abdominal pain 07/19/20 various complaints HTN, hernia insulin use soft tissue disorders 07/18/20 abdominal pain   Patient is able to verify HIPAA ([place] and [place]) identifiers Reviewed and addressed the purpose of the follow up call with the patient  Consent: [facility] ([place]) RN CM reviewed [facility] services with patient. Patient gave verbal consent for services.   Assessment Ms Aakre states she is doing fair as she reports she continues in pain She confirms a completed  MRI on 01/16/21 with a pending a call from MD to review result. She voiced concern that she had to pay a co-pay for the MRI RN CM educated her about co-pays related to advantage medicare coverage. Discussed the differences in medicaid and medicare She confirms she no longer have medicaid services   Pain lower back and abdominal pain from hernia Pain level is 10 all day all night  Taking pain medicines every 4 hours RN CM discussed use of Tylenol prn every 2 hours for break through pain and other alternative pain options    Diabetes/Hypertension home management Outreach with patient to [facility] staff at [phone] to speak pharmacy about cbg and BP monitors for patient  No answer   Outreach with patient to [employer]  customer services # [phone] to inquire about her over the counter (OTC) benefit spoke with Sheran Lawless to get a new OTC directory and find out her OTC benefit information She has a $360 quarterly OTC benefit. Glucose monitors are her under durable medical equipment (DME) benefit. Tamesha ordered her a new OTC catalog. Tamesha provided number to order [phone] (order code [id number]) one touch customer service# [phone] to get a new one touch monitor [facility] with her [facility]TC program to offer [facility] can [facility] [place] Also there are Aetna catalog's in the stores She will be able [facility] to get [facility] BP cuff today.  Pt returned a call to RN CM about 1300 01/17/21 reporting she received a package from [facility] with a new one touch glucometer with only 10 test strips. RN CM encouraged her to follow the discussed plan from earlier in the day. She is to call her pcp to request a prescription for more test strips [facility] and then pick them up along with a new BP cuff    Plans  Patient agrees to care plan and follow up within the next 14-21 business days   Keilee Denman L. Lavina Hamman, RN, BSN, [place] Coordinator Office number [phone] Main [facility] number [phone] Fax number [phone]

## 2021-01-18 DIAGNOSIS — E119 Type 2 diabetes mellitus without complications: Secondary | ICD-10-CM | POA: Diagnosis not present

## 2021-01-19 DIAGNOSIS — E119 Type 2 diabetes mellitus without complications: Secondary | ICD-10-CM | POA: Diagnosis not present

## 2021-01-20 DIAGNOSIS — E119 Type 2 diabetes mellitus without complications: Secondary | ICD-10-CM | POA: Diagnosis not present

## 2021-01-21 DIAGNOSIS — E119 Type 2 diabetes mellitus without complications: Secondary | ICD-10-CM | POA: Diagnosis not present

## 2021-01-22 DIAGNOSIS — E119 Type 2 diabetes mellitus without complications: Secondary | ICD-10-CM | POA: Diagnosis not present

## 2021-01-23 DIAGNOSIS — E119 Type 2 diabetes mellitus without complications: Secondary | ICD-10-CM | POA: Diagnosis not present

## 2021-01-24 DIAGNOSIS — E119 Type 2 diabetes mellitus without complications: Secondary | ICD-10-CM | POA: Diagnosis not present

## 2021-01-25 DIAGNOSIS — Z800 Family history of malignant neoplasm of digestive organs: Secondary | ICD-10-CM | POA: Diagnosis not present

## 2021-01-25 DIAGNOSIS — G629 Polyneuropathy, unspecified: Secondary | ICD-10-CM | POA: Diagnosis not present

## 2021-01-25 DIAGNOSIS — R69 Illness, unspecified: Secondary | ICD-10-CM | POA: Diagnosis not present

## 2021-01-25 DIAGNOSIS — E1165 Type 2 diabetes mellitus with hyperglycemia: Secondary | ICD-10-CM | POA: Diagnosis not present

## 2021-01-25 DIAGNOSIS — R6881 Early satiety: Secondary | ICD-10-CM | POA: Diagnosis not present

## 2021-01-25 DIAGNOSIS — I119 Hypertensive heart disease without heart failure: Secondary | ICD-10-CM | POA: Diagnosis not present

## 2021-01-25 DIAGNOSIS — K6289 Other specified diseases of anus and rectum: Secondary | ICD-10-CM | POA: Diagnosis not present

## 2021-01-25 DIAGNOSIS — I10 Essential (primary) hypertension: Secondary | ICD-10-CM | POA: Diagnosis not present

## 2021-01-25 DIAGNOSIS — Z1211 Encounter for screening for malignant neoplasm of colon: Secondary | ICD-10-CM | POA: Diagnosis not present

## 2021-01-25 DIAGNOSIS — Z9889 Other specified postprocedural states: Secondary | ICD-10-CM | POA: Diagnosis not present

## 2021-01-25 DIAGNOSIS — K5900 Constipation, unspecified: Secondary | ICD-10-CM | POA: Diagnosis not present

## 2021-01-25 DIAGNOSIS — R634 Abnormal weight loss: Secondary | ICD-10-CM | POA: Diagnosis not present

## 2021-01-25 DIAGNOSIS — M8700 Idiopathic aseptic necrosis of unspecified bone: Secondary | ICD-10-CM | POA: Diagnosis not present

## 2021-01-25 DIAGNOSIS — Z794 Long term (current) use of insulin: Secondary | ICD-10-CM | POA: Diagnosis not present

## 2021-01-25 DIAGNOSIS — E119 Type 2 diabetes mellitus without complications: Secondary | ICD-10-CM | POA: Diagnosis not present

## 2021-01-25 DIAGNOSIS — Z01818 Encounter for other preprocedural examination: Secondary | ICD-10-CM | POA: Diagnosis not present

## 2021-01-25 DIAGNOSIS — J4540 Moderate persistent asthma, uncomplicated: Secondary | ICD-10-CM | POA: Diagnosis not present

## 2021-01-25 DIAGNOSIS — R109 Unspecified abdominal pain: Secondary | ICD-10-CM | POA: Diagnosis not present

## 2021-01-26 DIAGNOSIS — E119 Type 2 diabetes mellitus without complications: Secondary | ICD-10-CM | POA: Diagnosis not present

## 2021-01-27 DIAGNOSIS — E119 Type 2 diabetes mellitus without complications: Secondary | ICD-10-CM | POA: Diagnosis not present

## 2021-01-28 DIAGNOSIS — E119 Type 2 diabetes mellitus without complications: Secondary | ICD-10-CM | POA: Diagnosis not present

## 2021-01-29 DIAGNOSIS — E119 Type 2 diabetes mellitus without complications: Secondary | ICD-10-CM | POA: Diagnosis not present

## 2021-01-30 DIAGNOSIS — E119 Type 2 diabetes mellitus without complications: Secondary | ICD-10-CM | POA: Diagnosis not present

## 2021-01-31 DIAGNOSIS — E119 Type 2 diabetes mellitus without complications: Secondary | ICD-10-CM | POA: Diagnosis not present

## 2021-02-01 DIAGNOSIS — E119 Type 2 diabetes mellitus without complications: Secondary | ICD-10-CM | POA: Diagnosis not present

## 2021-02-02 DIAGNOSIS — R5383 Other fatigue: Secondary | ICD-10-CM | POA: Diagnosis not present

## 2021-02-02 DIAGNOSIS — K5900 Constipation, unspecified: Secondary | ICD-10-CM | POA: Diagnosis not present

## 2021-02-02 DIAGNOSIS — K469 Unspecified abdominal hernia without obstruction or gangrene: Secondary | ICD-10-CM | POA: Diagnosis not present

## 2021-02-02 DIAGNOSIS — R531 Weakness: Secondary | ICD-10-CM | POA: Diagnosis not present

## 2021-02-02 DIAGNOSIS — Z20822 Contact with and (suspected) exposure to covid-19: Secondary | ICD-10-CM | POA: Diagnosis not present

## 2021-02-02 DIAGNOSIS — R42 Dizziness and giddiness: Secondary | ICD-10-CM | POA: Diagnosis not present

## 2021-02-02 DIAGNOSIS — N179 Acute kidney failure, unspecified: Secondary | ICD-10-CM | POA: Diagnosis not present

## 2021-02-02 DIAGNOSIS — R6883 Chills (without fever): Secondary | ICD-10-CM | POA: Diagnosis not present

## 2021-02-02 DIAGNOSIS — K6289 Other specified diseases of anus and rectum: Secondary | ICD-10-CM | POA: Diagnosis not present

## 2021-02-02 DIAGNOSIS — R109 Unspecified abdominal pain: Secondary | ICD-10-CM | POA: Diagnosis not present

## 2021-02-02 DIAGNOSIS — E119 Type 2 diabetes mellitus without complications: Secondary | ICD-10-CM | POA: Diagnosis not present

## 2021-02-03 DIAGNOSIS — E119 Type 2 diabetes mellitus without complications: Secondary | ICD-10-CM | POA: Diagnosis not present

## 2021-02-04 DIAGNOSIS — E119 Type 2 diabetes mellitus without complications: Secondary | ICD-10-CM | POA: Diagnosis not present

## 2021-02-05 DIAGNOSIS — E119 Type 2 diabetes mellitus without complications: Secondary | ICD-10-CM | POA: Diagnosis not present

## 2021-02-06 DIAGNOSIS — E119 Type 2 diabetes mellitus without complications: Secondary | ICD-10-CM | POA: Diagnosis not present

## 2021-02-06 DIAGNOSIS — G894 Chronic pain syndrome: Secondary | ICD-10-CM | POA: Diagnosis not present

## 2021-02-07 DIAGNOSIS — M25551 Pain in right hip: Secondary | ICD-10-CM | POA: Diagnosis not present

## 2021-02-07 DIAGNOSIS — M79642 Pain in left hand: Secondary | ICD-10-CM | POA: Diagnosis not present

## 2021-02-07 DIAGNOSIS — M25552 Pain in left hip: Secondary | ICD-10-CM | POA: Diagnosis not present

## 2021-02-07 DIAGNOSIS — M5417 Radiculopathy, lumbosacral region: Secondary | ICD-10-CM | POA: Diagnosis not present

## 2021-02-07 DIAGNOSIS — M542 Cervicalgia: Secondary | ICD-10-CM | POA: Diagnosis not present

## 2021-02-07 DIAGNOSIS — M25511 Pain in right shoulder: Secondary | ICD-10-CM | POA: Diagnosis not present

## 2021-02-07 DIAGNOSIS — G894 Chronic pain syndrome: Secondary | ICD-10-CM | POA: Diagnosis not present

## 2021-02-07 DIAGNOSIS — M79672 Pain in left foot: Secondary | ICD-10-CM | POA: Diagnosis not present

## 2021-02-07 DIAGNOSIS — Z79891 Long term (current) use of opiate analgesic: Secondary | ICD-10-CM | POA: Diagnosis not present

## 2021-02-07 DIAGNOSIS — M79641 Pain in right hand: Secondary | ICD-10-CM | POA: Diagnosis not present

## 2021-02-07 DIAGNOSIS — E119 Type 2 diabetes mellitus without complications: Secondary | ICD-10-CM | POA: Diagnosis not present

## 2021-02-07 DIAGNOSIS — M79671 Pain in right foot: Secondary | ICD-10-CM | POA: Diagnosis not present

## 2021-02-07 DIAGNOSIS — M25512 Pain in left shoulder: Secondary | ICD-10-CM | POA: Diagnosis not present

## 2021-02-08 DIAGNOSIS — M8700 Idiopathic aseptic necrosis of unspecified bone: Secondary | ICD-10-CM | POA: Diagnosis not present

## 2021-02-08 DIAGNOSIS — I119 Hypertensive heart disease without heart failure: Secondary | ICD-10-CM | POA: Diagnosis not present

## 2021-02-08 DIAGNOSIS — Z794 Long term (current) use of insulin: Secondary | ICD-10-CM | POA: Diagnosis not present

## 2021-02-08 DIAGNOSIS — G629 Polyneuropathy, unspecified: Secondary | ICD-10-CM | POA: Diagnosis not present

## 2021-02-08 DIAGNOSIS — I10 Essential (primary) hypertension: Secondary | ICD-10-CM | POA: Diagnosis not present

## 2021-02-08 DIAGNOSIS — J4540 Moderate persistent asthma, uncomplicated: Secondary | ICD-10-CM | POA: Diagnosis not present

## 2021-02-08 DIAGNOSIS — E119 Type 2 diabetes mellitus without complications: Secondary | ICD-10-CM | POA: Diagnosis not present

## 2021-02-08 DIAGNOSIS — E1165 Type 2 diabetes mellitus with hyperglycemia: Secondary | ICD-10-CM | POA: Diagnosis not present

## 2021-02-08 DIAGNOSIS — R69 Illness, unspecified: Secondary | ICD-10-CM | POA: Diagnosis not present

## 2021-02-09 DIAGNOSIS — E119 Type 2 diabetes mellitus without complications: Secondary | ICD-10-CM | POA: Diagnosis not present

## 2021-02-10 DIAGNOSIS — E119 Type 2 diabetes mellitus without complications: Secondary | ICD-10-CM | POA: Diagnosis not present

## 2021-02-10 NOTE — Progress Notes (Signed)
Sent message, via epic in basket, requesting orders in epic from surgeon.  

## 2021-02-13 ENCOUNTER — Encounter (HOSPITAL_COMMUNITY): Payer: Self-pay

## 2021-02-13 ENCOUNTER — Ambulatory Visit: Payer: Self-pay | Admitting: Student

## 2021-02-13 DIAGNOSIS — E119 Type 2 diabetes mellitus without complications: Secondary | ICD-10-CM | POA: Diagnosis not present

## 2021-02-13 NOTE — Patient Instructions (Signed)
DUE TO COVID-19 ONLY ONE VISITOR IS ALLOWED TO COME WITH YOU AND STAY IN THE WAITING ROOM ONLY DURING PRE OP AND PROCEDURE DAY OF SURGERY  IF YOU ARE GOING HOME AFTER SURGERY. IF YOU ARE SPENDING THE NIGHT 2 PEOPLE MAY VISIT WITH YOU IN YOUR PRIVATE ROOM AFTER SURGERY UNTIL VISITING  HOURS ARE OVER AT 800 PM AND THE 2 VISITORS CANNOT SPEND THE NIGHT.   YOU NEED TO HAVE A COVID 19 TEST ON__9/6_____THIS TEST MUST BE DONE BEFORE SURGERY,   COVID TESTING SITE  IS LOCATED AT [place], [place]. REMAIN IN YOUR CAR THIS IS A DRIVE UP TEST.   AFTER YOUR COVID TEST PLEASE WEAR A MASK OUT IN PUBLIC AND SOCIAL DISTANCE AND [place] YOUR HANDS FREQUENTLY, ALSO ASK ALL YOUR CLOSE CONTACT PERSONS TO WEAR A MASK AND SOCIAL DISTANCE AND [place] THEIR HANDS FREQUENTLY ALSO.               Diana Henderson     Your procedure is scheduled on: 02/23/21   Report to [facility] Main  Entrance   Report to admitting at 7:45 AM     Call this number if you have problems the morning of surgery [phone]   No food after midnight.    You may have clear liquids until 7:30 am  CLEAR LIQUID DIET   Foods Allowed                                                                     Foods Excluded       NO MILK OR CREAMER  Coffee and tea, regular and decaf                             liquids that you cannot  Plain Jell-O any favor except red or purple                                           see through such as: Fruit ices (not with fruit pulp)                                     milk, soups, orange juice  Iced Popsicles                                    All solid food Carbonated beverages, regular and diet                                    Cranberry, grape and apple juices Sports drinks like Gatorade Lightly seasoned clear broth or consume(fat free) Sugar     BRUSH YOUR TEETH MORNING OF SURGERY AND RINSE YOUR MOUTH OUT, NO CHEWING GUM CANDY OR MINTS.      Take these medicines the  morning of surgery with A SIP OF WATER: Duloxetine, Amlodipine Use your inhaler  and bring it with you. Bring your mask and tubing to the hospital       How to Manage Your Diabetes Before and After Surgery  Why is it important to control my blood sugar before and after surgery? Improving blood sugar levels before and after surgery helps healing and can limit problems. A way of improving blood sugar control is eating a healthy diet by:  Eating less sugar and carbohydrates  Increasing activity/exercise  Talking with your doctor about reaching your blood sugar goals High blood sugars (greater than 180 mg/dL) can raise your risk of infections and slow your recovery, so you will need to focus on controlling your diabetes during the weeks before surgery. Make sure that the doctor who takes care of your diabetes knows about your planned surgery including the date and location.  How do I manage my blood sugar before surgery? Check your blood sugar at least 4 times a day, starting 2 days before surgery, to make sure that the level is not too high or low. Check your blood sugar the morning of your surgery when you wake up and every 2 hours until you get to the Short Stay unit. If your blood sugar is less than 70 mg/dL, you will need to treat for low blood sugar: Do not take insulin. Treat a low blood sugar (less than 70 mg/dL) with  cup of clear juice (cranberry or apple), 4 glucose tablets, OR glucose gel. Recheck blood sugar in 15 minutes after treatment (to make sure it is greater than 70 mg/dL). If your blood sugar is not greater than 70 mg/dL on recheck, call [phone] for further instructions. Report your blood sugar to the short stay nurse when you get to Short Stay.  If you are admitted to the hospital after surgery: Your blood sugar will be checked by the staff and you will probably be given insulin after surgery (instead of oral diabetes medicines) to make sure you have good blood sugar  levels. The goal for blood sugar control after surgery is 80-180 mg/dL.   WHAT DO I DO ABOUT MY DIABETES MEDICATION?  Do not take oral diabetes medicines (pills) the morning of surgery.  THE NIGHT BEFORE SURGERY, take   50 % of your dose 5   units of  Levemir  insulin.       THE MORNING OF SURGERY, take 5  units of   levemir  insulin.  T                           You may not have any metal on your body including hair pins and              piercings  Do not wear jewelry, make-up, lotions, powders or perfumes, deodorant             Do not wear nail polish on your fingernails or toes.  Do not shave  48 hours prior to surgery.               Do not bring valuables to the hospital. [place].  Contacts, dentures or bridgework may not be worn into surgery.                 [facility] - Preparing for Surgery Before surgery, you can play an important role.  Because  skin is not sterile, your skin needs to be as free of germs as possible.  You can reduce the number of germs on your skin by washing with CHG (chlorahexidine gluconate) soap before surgery.  CHG is an antiseptic cleaner which kills germs and bonds with the skin to continue killing germs even after washing. Please DO NOT use if you have an allergy to CHG or antibacterial soaps.  If your skin becomes reddened/irritated stop using the CHG and inform your nurse when you arrive at Short Stay. Do not shave (including legs and underarms) for at least 48 hours prior to the first CHG shower.    Please follow these instructions carefully:  1.  Shower with CHG Soap the night before surgery and the  morning of Surgery.  2.  If you choose to wash your hair, wash your hair first as usual with your  normal  shampoo.  3.  After you shampoo, rinse your hair and body thoroughly to remove the  shampoo.                            4.  Use CHG as you would any other liquid soap.  You can apply chg  directly  to the skin and wash                       Gently with a scrungie or clean washcloth.  5.  Apply the CHG Soap to your body ONLY FROM THE NECK DOWN.   Do not use on face/ open                           Wound or open sores. Avoid contact with eyes, ears mouth and genitals (private parts).                       Wash face,  Genitals (private parts) with your normal soap.             6.  Wash thoroughly, paying special attention to the area where your surgery  will be performed.  7.  Thoroughly rinse your body with warm water from the neck down.  8.  DO NOT shower/wash with your normal soap after using and rinsing off  the CHG Soap.             9.  Pat yourself dry with a clean towel.            10.  Wear clean pajamas.            11.  Place clean sheets on your bed the night of your first shower and do not  sleep with pets. Day of Surgery : Do not apply any lotions/deodorants the morning of surgery.  Please wear clean clothes to the hospital/surgery center.  FAILURE TO FOLLOW THESE INSTRUCTIONS MAY RESULT IN THE CANCELLATION OF YOUR SURGERY PATIENT SIGNATURE_________________________________  NURSE SIGNATURE__________________________________  ________________________________________________________________________   Adam Phenix  An incentive spirometer is a tool that can help keep your lungs clear and active. This tool measures how well you are filling your lungs with each breath. Taking long deep breaths may help reverse or decrease the chance of developing breathing (pulmonary) problems (especially infection) following: A long period of time when you are unable to move or be active. BEFORE THE PROCEDURE  If the spirometer includes an indicator to show your  best effort, your nurse or respiratory therapist will set it to a desired goal. If possible, sit up straight or lean slightly forward. Try not to slouch. Hold the incentive spirometer in an upright  position. INSTRUCTIONS FOR USE  Sit on the edge of your bed if possible, or sit up as far as you can in bed or on a chair. Hold the incentive spirometer in an upright position. Breathe out normally. Place the mouthpiece in your mouth and seal your lips tightly around it. Breathe in slowly and as deeply as possible, raising the piston or the ball toward the top of the column. Hold your breath for 3-5 seconds or for as long as possible. Allow the piston or ball to fall to the bottom of the column. Remove the mouthpiece from your mouth and breathe out normally. Rest for a few seconds and repeat Steps 1 through 7 at least 10 times every 1-2 hours when you are awake. Take your time and take a few normal breaths between deep breaths. The spirometer may include an indicator to show your best effort. Use the indicator as a goal to work toward during each repetition. After each set of 10 deep breaths, practice coughing to be sure your lungs are clear. If you have an incision (the cut made at the time of surgery), support your incision when coughing by placing a pillow or rolled up towels firmly against it. Once you are able to get out of bed, walk around indoors and cough well. You may stop using the incentive spirometer when instructed by your caregiver.  RISKS AND COMPLICATIONS Take your time so you do not get dizzy or light-headed. If you are in pain, you may need to take or ask for pain medication before doing incentive spirometry. It is harder to take a deep breath if you are having pain. AFTER USE Rest and breathe slowly and easily. It can be helpful to keep track of a log of your progress. Your caregiver can provide you with a simple table to help with this. If you are using the spirometer at home, follow these instructions: [place] IF:  You are having difficultly using the spirometer. You have trouble using the spirometer as often as instructed. Your pain medication is not giving  enough relief while using the spirometer. You develop fever of 100.5 F (38.1 C) or higher. SEEK IMMEDIATE MEDICAL CARE IF:  You cough up bloody sputum that had not been present before. You develop fever of 102 F (38.9 C) or greater. You develop worsening pain at or near the incision site. MAKE SURE YOU:  Understand these instructions. Will watch your condition. Will get help right away if you are not doing well or get worse. Document Released: 10/15/2006 Document Revised: 08/27/2011 Document Reviewed: 12/16/2006 [facility] Patient Information 2014 [place], [place].   ________________________________________________________________________

## 2021-02-14 ENCOUNTER — Encounter (HOSPITAL_COMMUNITY)
Admission: RE | Admit: 2021-02-14 | Discharge: 2021-02-14 | Disposition: A | Discharge status: Home / Self Care | Payer: Medicare HMO | Source: Ambulatory Visit | Attending: Orthopedic Surgery | Admitting: Orthopedic Surgery

## 2021-02-14 ENCOUNTER — Ambulatory Visit: Payer: Self-pay | Admitting: Student

## 2021-02-14 ENCOUNTER — Other Ambulatory Visit: Payer: Self-pay

## 2021-02-14 ENCOUNTER — Encounter (HOSPITAL_COMMUNITY): Payer: Self-pay

## 2021-02-14 DIAGNOSIS — Z01812 Encounter for preprocedural laboratory examination: Secondary | ICD-10-CM | POA: Diagnosis present

## 2021-02-14 DIAGNOSIS — Z794 Long term (current) use of insulin: Secondary | ICD-10-CM | POA: Diagnosis not present

## 2021-02-14 DIAGNOSIS — M1612 Unilateral primary osteoarthritis, left hip: Secondary | ICD-10-CM | POA: Insufficient documentation

## 2021-02-14 DIAGNOSIS — Z87891 Personal history of nicotine dependence: Secondary | ICD-10-CM | POA: Insufficient documentation

## 2021-02-14 DIAGNOSIS — E119 Type 2 diabetes mellitus without complications: Secondary | ICD-10-CM | POA: Insufficient documentation

## 2021-02-14 DIAGNOSIS — Z79899 Other long term (current) drug therapy: Secondary | ICD-10-CM | POA: Insufficient documentation

## 2021-02-14 LAB — TYPE AND SCREEN
ABO/RH(D): B POS
Antibody Screen: NEGATIVE

## 2021-02-14 LAB — COMPREHENSIVE METABOLIC PANEL
ALT: 9 U/L (ref 0–44)
AST: 12 U/L — ABNORMAL LOW (ref 15–41)
Albumin: 3.9 g/dL (ref 3.5–5.0)
Alkaline Phosphatase: 56 U/L (ref 38–126)
Anion gap: 12 (ref 5–15)
BUN: 19 mg/dL (ref 6–20)
CO2: 31 mmol/L (ref 22–32)
Calcium: 9.8 mg/dL (ref 8.9–10.3)
Chloride: 93 mmol/L — ABNORMAL LOW (ref 98–111)
Creatinine, Ser: 1.61 mg/dL — ABNORMAL HIGH (ref 0.44–1.00)
GFR, Estimated: 38 mL/min — ABNORMAL LOW (ref >60)
Glucose, Bld: 157 mg/dL — ABNORMAL HIGH (ref 70–99)
Potassium: 3.4 mmol/L — ABNORMAL LOW (ref 3.5–5.1)
Sodium: 136 mmol/L (ref 135–145)
Total Bilirubin: 0.9 mg/dL (ref 0.3–1.2)
Total Protein: 7.1 g/dL (ref 6.5–8.1)

## 2021-02-14 LAB — SURGICAL PCR SCREEN
MRSA, PCR: NEGATIVE
Staphylococcus aureus: NEGATIVE

## 2021-02-14 LAB — PROTIME-INR
INR: 0.9 (ref 0.8–1.2)
Prothrombin Time: 12.6 seconds (ref 11.4–15.2)

## 2021-02-14 LAB — CBC
HCT: 42.9 % (ref 36.0–46.0)
Hemoglobin: 13.5 g/dL (ref 12.0–15.0)
MCH: 26.5 pg (ref 26.0–34.0)
MCHC: 31.5 g/dL (ref 30.0–36.0)
MCV: 84.1 fL (ref 80.0–100.0)
Platelets: 194 10*3/uL (ref 150–400)
RBC: 5.1 MIL/uL (ref 3.87–5.11)
RDW: 14.2 % (ref 11.5–15.5)
WBC: 5.5 10*3/uL (ref 4.0–10.5)
nRBC: 0 % (ref 0.0–0.2)

## 2021-02-14 LAB — GLUCOSE, CAPILLARY: Glucose-Capillary: 154 mg/dL — ABNORMAL HIGH (ref 70–99)

## 2021-02-14 NOTE — H&P (Signed)
TOTAL HIP ADMISSION H&P  Patient is admitted for left total hip arthroplasty.  Subjective:  Chief Complaint: left hip pain  HPI: Diana Henderson, 50 y.o. female, has a history of pain and functional disability in the left hip(s) due to arthritis and patient has failed non-surgical conservative treatments for greater than 12 weeks to include NSAID's and/or analgesics and activity modification.  Onset of symptoms was gradual starting 2 years ago with gradually worsening course since that time.The patient noted no past surgery on the left hip(s).  Patient currently rates pain in the left hip at 10 out of 10 with activity. Patient has worsening of pain with activity and weight bearing, pain that interfers with activities of daily living, and pain with passive range of motion. Patient has evidence of  avascular necrosis  by imaging studies. This condition presents safety issues increasing the risk of falls. There is no current active infection.  Patient Active Problem List   Diagnosis Date Noted   Aseptic necrosis of bone ([place]) 01/09/2021   Chronic obstructive pulmonary disease ([place]) 09/15/2020   Deep venous thrombosis of peroneal vein ([place]) 09/15/2020   Dysphagia 09/15/2020   Excessive and frequent menstruation 09/15/2020   Gastro-esophageal reflux disease without esophagitis 09/15/2020   Hyperglycemia due to type 2 diabetes mellitus ([place]) 09/15/2020   Hypertensive heart disease without congestive heart failure 09/15/2020   Long term (current) use of insulin ([place]) 09/15/2020   Moderate persistent asthma, uncomplicated [id number]   Primary osteoarthritis, unspecified ankle and foot 09/15/2020   Other chronic pain 07/25/2020   Chronic low back pain 07/25/2020   Body mass index (BMI) 40.0-44.9, adult ([place]) 06/29/2020   Diabetic polyneuropathy associated with type 2 diabetes mellitus ([place]) 08/25/2019   Plantar fasciitis, left 08/25/2019   Tendonitis of ankle or foot 08/25/2019   Tight heel  cords, acquired, bilateral 08/25/2019   Physical deconditioning 02/03/2018   Neck pain 10/16/2017   Dyslipidemia 10/22/2016   Left rotator cuff tear 07/31/2016   Diabetes mellitus, insulin dependent (IDDM), uncontrolled 09/18/2015   AKI (acute kidney injury) ([place]) 09/18/2015   Morbid obesity ([place]) 09/18/2015   Asthma 09/18/2015   Uncontrolled type 2 diabetes mellitus with complication (HCC)    Chronic diastolic CHF (congestive heart failure), NYHA class 1 ([place])    PTSD (post-traumatic stress disorder)    Bipolar I disorder ([place])    Anxiety state    Knee pain, chronic    HTN (hypertension), malignant 09/08/2014   Cervical spondylosis with myelopathy 07/07/2014   HNP (herniated nucleus pulposus) with myelopathy, cervical 06/29/2014   Deformity of metatarsal bone of right foot 06/28/2014   CTS (carpal tunnel syndrome) 05/04/2014   OSA (obstructive sleep apnea) 05/04/2014   Hereditary and idiopathic peripheral neuropathy 05/04/2014   Ganglion cyst 03/10/2014   Vision loss, left eye 02/18/2014   Conjunctivitis 02/17/2014   Asthma exacerbation 01/13/2014   Abdominal pain, unspecified site 10/28/2013   Injury of foot, right, superficial 10/23/2013   Tenosynovitis of foot 08/17/2013   Pain in joint, ankle and foot 02/17/2013   Edema of foot 02/17/2013   Status post foot surgery 09/09/2012   Hyperlipidemia    Major depressive disorder, recurrent, 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 by Dr. Gwenette Greet- last 06/2014   Bipolar 1 disorder ([place])    Complication of anesthesia    09/13/14: malignant HTN  with inducction, case cx but no sourse identified; 11/05/14 remifentanyl infusion used to attenuate hypergynamic induction   Depression    Diabetes mellitus    GERD (gastroesophageal reflux disease)    Hyperlipidemia    Hypertension    Knee pain, chronic    OSA on CPAP     doesn't use it every night   Pain in joint, ankle and foot 02/17/2013   Pneumonia    hosp. for a couple of day- not sure when it was    PTSD (post-traumatic stress disorder)    Rotator cuff tear    bicep tendon    Shortness of breath dyspnea     Past Surgical History:  Procedure Laterality Date   ANTERIOR CERVICAL DECOMP/DISCECTOMY FUSION N/A 11/05/2014   Procedure: ANTERIOR CERVICAL DECOMPRESSION/DISCECTOMY FUSION 1 LEVEL;  Surgeon: Consuella Lose, MD;  Location: [facility];  Service: Neurosurgery;  Laterality: N/A;  Cervical five- cervical seven anterior cervical decompression with fusion interbody prosthesis plating and bonegraft   CARPAL TUNNEL RELEASE Left 2001   Left wrist   CERVICAL LAMINECTOMY  03/31/20176   CERVICAL 4 SEVEN LAMINECTOMIES WITH LATERAL FUSION   [place] Osteotomy w/ Bone Graft Right 09/30/2014   Rt foot @ [facility]   GASTROC RECESSION EXTREMITY Right 3//20/14   Right foot   HERNIA REPAIR     1996   LAPAROSCOPY N/A 08/23/2020   Procedure: LAPAROSCOPIC LYSIS OF ADHESIONS AND OPEN REMOVAL OF ABDOMINAL WALL MESH;  Surgeon: Clovis Riley, MD;  Location: [facility];  Service: General;  Laterality: N/A;  90   [place] fusion Right 01/10/12   Rt foot   MIDTARSAL ARTHRODESIS Right 07/17/12   Rt foot   POSTERIOR CERVICAL FUSION/FORAMINOTOMY N/A 09/16/2015   Procedure: Cervical four - seven laminectomies with lateral mass fusion;  Surgeon: Consuella Lose, MD;  Location: [facility];  Service: Neurosurgery;  Laterality: N/A;  C4-7 laminectomy with lateral mass fusion   Remove implant deep Right 07/17/12   Rt foot   Remove Implant Deep Right 07/22/2014   Rt foot @ [facility]   SHOULDER ARTHROSCOPY WITH ROTATOR CUFF REPAIR AND SUBACROMIAL DECOMPRESSION Left 07/31/2016   Procedure: LEFT SHOULDER ARTHROSCOPY WITH ROTATOR CUFF REPAIR;  Surgeon: Renette Butters, MD;  Location: [facility];  Service: Orthopedics;  Laterality: Left;   TUBAL LIGATION      Current Outpatient Medications   Medication Sig Dispense Refill Last Dose   amLODipine (NORVASC) 10 MG tablet Take 10 mg by mouth daily.       atenolol-chlorthalidone (TENORETIC) 100-25 MG tablet Take 1 tablet by mouth daily.      BD PEN NEEDLE NANO 2ND GEN 32G X 4 MM MISC See admin instructions.      budesonide-formoterol (SYMBICORT) 160-4.5 MCG/ACT inhaler Inhale 2 puffs into the lungs 2 (two) times daily. (Patient taking differently: Inhale 2 puffs into the lungs 2 (two) times daily as needed (wheezing).) 1 Inhaler 0    capsaicin (ZOSTRIX) 0.025 % cream Apply topically 2 (two) times daily. (Patient not taking: Reported on 12/07/2020) 60 g 0    cyclobenzaprine (FLEXERIL) 10 MG tablet Take 10 mg by mouth every 12 (twelve) hours as needed for muscle spasms.      diazepam (VALIUM) 2 MG tablet Take 1 tablet (2 mg total) by mouth every 6 (six) hours as needed for muscle spasms. 30 tablet 0    DULoxetine (CYMBALTA) 20 MG capsule Take 20 mg by mouth daily.      glipiZIDE (GLUCOTROL XL) 10 MG  24 hr tablet Take 10 mg by mouth daily.      glucose blood (ACCU-CHEK AVIVA PLUS) test strip TEST FOUR TIMES DAILY      LEVEMIR FLEXTOUCH 100 UNIT/ML FlexPen Inject 10 Units into the skin 2 (two) times daily.      mirtazapine (REMERON) 15 MG tablet Take 15 mg by mouth at bedtime.      ondansetron (ZOFRAN ODT) 4 MG disintegrating tablet Take 1 tablet (4 mg total) by mouth every 8 (eight) hours as needed for nausea or vomiting. (Patient not taking: No sig reported) 12 tablet 0    oxyCODONE (ROXICODONE) 15 MG immediate release tablet Take 15 mg by mouth in the morning, at noon, in the evening, and at bedtime.      pregabalin (LYRICA) 25 MG capsule Take 25 mg by mouth at bedtime.      VENTOLIN HFA 108 (90 Base) MCG/ACT inhaler INHALE 1 TO 2 PUFFS INTO THE LUNGS EVERY 6 (SIX) HOURS AS NEEDED FOR WHEEZING OR SHORTNESS OF BREATH. (Patient taking differently: Inhale 1-2 puffs into the lungs every 6 (six) hours as needed for wheezing or shortness of breath.)  18 each 2    No current facility-administered medications for this visit.   Allergies  Allergen Reactions   Celery (Apium Graveolens Var. Dulce) Skin Test Hives   Celery Oil Hives   Pork-Derived Products Nausea And Vomiting and Other (See Comments)    headache   Penicillin G     Other reaction(s): vaginal issues   Latex Itching    Powdered latex gloves when patient wears.     Other Itching and Rash    PLEASE USE COBAN WRAP, IF POSSIBLE Powdered latex gloves when patient wears.     Penicillins Rash    Reported by pt daughter   Tape Itching and Rash    PLEASE USE COBAN WRAP, IF POSSIBLE    Social History   Tobacco Use   Smoking status: Former    Packs/day: 2.00    Years: 6.00    Pack years: 12.00    Types: Cigarettes    Quit date: 06/18/1978    Years since quitting: 42.6   Smokeless tobacco: Never  Substance Use Topics   Alcohol use: Not Currently    Family History  Problem Relation Age of Onset   Kidney disease Mother    Heart attack Father      Review of Systems  Constitutional: Negative.   HENT: Negative.    Eyes: Negative.   Respiratory: Negative.    Cardiovascular: Negative.   Gastrointestinal: Negative.   Endocrine: Negative.   Genitourinary: Negative.   Musculoskeletal:  Positive for arthralgias, back pain and neck pain.  Skin: Negative.   Allergic/Immunologic: Negative.   Neurological:  Positive for weakness and numbness.   Objective:  Physical Exam Constitutional:      Appearance: She is obese.  HENT:     Head: Normocephalic.  Eyes:     Pupils: Pupils are equal, round, and reactive to light.  Cardiovascular:     Rate and Rhythm: Normal rate.     Pulses: Normal pulses.  Pulmonary:     Effort: Pulmonary effort is normal.  Abdominal:     Palpations: Abdomen is soft.     Tenderness: There is no abdominal tenderness.  Genitourinary:    Comments: Deferred Musculoskeletal:     Cervical back: Normal range of motion.     Comments: Painful ROM L  hip  Skin:    General: Skin  is warm and dry.  Neurological:     Mental Status: She is alert and oriented to person, place, and time.  Psychiatric:        Behavior: Behavior normal.    Vital signs in last 24 hours: '@VSRANGES'$ @  Labs:   Estimated body mass index is 30.79 kg/m as calculated from the following:   Height as of an earlier encounter on 02/14/21: '5\' 5"'$  (1.651 m).   Weight as of an earlier encounter on 02/14/21: 83.9 kg.   Imaging Review Plain radiographs demonstrate severe degenerative joint disease of the left hip(s). The bone quality appears to be adequate for age and reported activity level.      Assessment/Plan:  End stage arthritis, left hip(s)  The patient history, physical examination, clinical judgement of the provider and imaging studies are consistent with end stage degenerative joint disease of the left hip(s) and total hip arthroplasty is deemed medically necessary. The treatment options including medical management, injection therapy, arthroscopy and arthroplasty were discussed at length. The risks and benefits of total hip arthroplasty were presented and reviewed. The risks due to aseptic loosening, infection, stiffness, dislocation/subluxation,  thromboembolic complications and other imponderables were discussed.  The patient acknowledged the explanation, agreed to proceed with the plan and consent was signed. Patient is being admitted for inpatient treatment for surgery, pain control, PT, OT, prophylactic antibiotics, VTE prophylaxis, progressive ambulation and ADL's and discharge planning.The patient is planning to be discharged  home after overnight observation

## 2021-02-14 NOTE — Progress Notes (Signed)
COVID Test Completed:02/21/21   PCP - Dr. Darnell Level. [place] 11/16/20 Cardiologist - none  Chest x-ray - no EKG - 12/07/20-epic Stress Test - no ECHO - 09/06/14-epic Cardiac Cath - no Pacemaker/ICD device last checked:NA  Sleep Study - yes CPAP - yes  Fasting Blood Sugar - Pt doesn't know because her meter is broken and she is waiting for a new one Checks Blood Sugar _no____ times a day  Blood Thinner Instructions:NA Aspirin Instructions: Last Dose:  Anesthesia review: no  Patient denies shortness of breath, fever, cough and chest pain at PAT appointment yes. Pt hasn't been able to climb stairs but reports no SOB with ADLs. She has PTSD and bipolar depression. She has a decrease ROM in her neck due to a fusion.   Patient verbalized understanding of instructions that were given to them at the PAT appointment. Patient was also instructed that they will need to review over the PAT instructions again at home before surgery.Yes

## 2021-02-15 DIAGNOSIS — E119 Type 2 diabetes mellitus without complications: Secondary | ICD-10-CM | POA: Diagnosis not present

## 2021-02-15 LAB — HEMOGLOBIN A1C
Hgb A1c MFr Bld: 6.3 % — ABNORMAL HIGH (ref 4.8–5.6)
Mean Plasma Glucose: 134 mg/dL

## 2021-02-16 DIAGNOSIS — R69 Illness, unspecified: Secondary | ICD-10-CM | POA: Diagnosis not present

## 2021-02-16 DIAGNOSIS — E119 Type 2 diabetes mellitus without complications: Secondary | ICD-10-CM | POA: Diagnosis not present

## 2021-02-16 DIAGNOSIS — I119 Hypertensive heart disease without heart failure: Secondary | ICD-10-CM | POA: Diagnosis not present

## 2021-02-16 DIAGNOSIS — Z794 Long term (current) use of insulin: Secondary | ICD-10-CM | POA: Diagnosis not present

## 2021-02-16 DIAGNOSIS — M8700 Idiopathic aseptic necrosis of unspecified bone: Secondary | ICD-10-CM | POA: Diagnosis not present

## 2021-02-16 DIAGNOSIS — E1165 Type 2 diabetes mellitus with hyperglycemia: Secondary | ICD-10-CM | POA: Diagnosis not present

## 2021-02-16 DIAGNOSIS — I10 Essential (primary) hypertension: Secondary | ICD-10-CM | POA: Diagnosis not present

## 2021-02-16 DIAGNOSIS — J4540 Moderate persistent asthma, uncomplicated: Secondary | ICD-10-CM | POA: Diagnosis not present

## 2021-02-16 DIAGNOSIS — G629 Polyneuropathy, unspecified: Secondary | ICD-10-CM | POA: Diagnosis not present

## 2021-02-17 DIAGNOSIS — K430 Incisional hernia with obstruction, without gangrene: Secondary | ICD-10-CM | POA: Diagnosis not present

## 2021-02-17 DIAGNOSIS — E119 Type 2 diabetes mellitus without complications: Secondary | ICD-10-CM | POA: Diagnosis not present

## 2021-02-20 DIAGNOSIS — E119 Type 2 diabetes mellitus without complications: Secondary | ICD-10-CM | POA: Diagnosis not present

## 2021-02-21 DIAGNOSIS — E119 Type 2 diabetes mellitus without complications: Secondary | ICD-10-CM | POA: Diagnosis not present

## 2021-02-22 ENCOUNTER — Other Ambulatory Visit: Payer: Self-pay

## 2021-02-22 ENCOUNTER — Other Ambulatory Visit: Payer: Self-pay | Admitting: *Deleted

## 2021-02-22 DIAGNOSIS — E119 Type 2 diabetes mellitus without complications: Secondary | ICD-10-CM | POA: Diagnosis not present

## 2021-02-22 NOTE — Patient Outreach (Addendum)
[facility]) Care Management  02/22/2021  Akhia Carras 04/06/1971 [id number]   THN follow up with complex care patient   Ms Diana Henderson was referred to [facility] on 12/02/20 by Holland Falling MA Referral Reason: [facility] complex care-Tier 4/ [employer] [place] 5 ED visits, DM, COPD/Asthma, HTN Insurance: [employer] medicare     Assessment Ms Fehlman states she is doing fair as she reports she continues in pain RN CM awakened her  She reports she remains in bed Her procedures for her hernia and left hip have been cancelled She was seen by Dr Lyman Bishop on 02/17/21  for her recurrent 7 cm wide incisional hernia, s/p hernia mesh replacement. She is able to verbalize that she was informed she needs to try an abdominal binder.  RN CM discussed what an abdominal binder is and does  She reports her hip surgery was postpone related to abnormal pre lab value (potassium)  RN CM discussed with her the importance of pre op screening to decrease surgical risks   RN CM concluded the outreach as pt responses continued to be minimal. Pt agreed to another outreach at another time Empathy provided for pt's voiced disappointment related to her cancelled and postponed procedures  Plans  Patient agrees to care plan and follow up within the next 14-21 business days     Lynette Topete L. Lavina Hamman, RN, BSN, [place] Coordinator Office number [phone] Main [facility] number [phone] Fax number [phone]

## 2021-02-23 ENCOUNTER — Encounter (HOSPITAL_COMMUNITY): Admission: RE | Payer: Self-pay | Source: Home / Self Care

## 2021-02-23 ENCOUNTER — Ambulatory Visit (HOSPITAL_COMMUNITY): Admission: RE | Admit: 2021-02-23 | Payer: Medicare HMO | Source: Home / Self Care | Admitting: Orthopedic Surgery

## 2021-02-23 DIAGNOSIS — E119 Type 2 diabetes mellitus without complications: Secondary | ICD-10-CM | POA: Diagnosis not present

## 2021-02-23 SURGERY — ARTHROPLASTY, HIP, TOTAL, ANTERIOR APPROACH
Anesthesia: Spinal | Site: Hip | Laterality: Left

## 2021-02-24 DIAGNOSIS — N289 Disorder of kidney and ureter, unspecified: Secondary | ICD-10-CM | POA: Diagnosis not present

## 2021-02-24 DIAGNOSIS — G629 Polyneuropathy, unspecified: Secondary | ICD-10-CM | POA: Diagnosis not present

## 2021-02-24 DIAGNOSIS — I10 Essential (primary) hypertension: Secondary | ICD-10-CM | POA: Diagnosis not present

## 2021-02-24 DIAGNOSIS — Z794 Long term (current) use of insulin: Secondary | ICD-10-CM | POA: Diagnosis not present

## 2021-02-24 DIAGNOSIS — R69 Illness, unspecified: Secondary | ICD-10-CM | POA: Diagnosis not present

## 2021-02-24 DIAGNOSIS — E1165 Type 2 diabetes mellitus with hyperglycemia: Secondary | ICD-10-CM | POA: Diagnosis not present

## 2021-02-24 DIAGNOSIS — M8700 Idiopathic aseptic necrosis of unspecified bone: Secondary | ICD-10-CM | POA: Diagnosis not present

## 2021-02-24 DIAGNOSIS — I119 Hypertensive heart disease without heart failure: Secondary | ICD-10-CM | POA: Diagnosis not present

## 2021-02-24 DIAGNOSIS — J4540 Moderate persistent asthma, uncomplicated: Secondary | ICD-10-CM | POA: Diagnosis not present

## 2021-02-24 DIAGNOSIS — E119 Type 2 diabetes mellitus without complications: Secondary | ICD-10-CM | POA: Diagnosis not present

## 2021-02-27 DIAGNOSIS — E119 Type 2 diabetes mellitus without complications: Secondary | ICD-10-CM | POA: Diagnosis not present

## 2021-02-28 DIAGNOSIS — E119 Type 2 diabetes mellitus without complications: Secondary | ICD-10-CM | POA: Diagnosis not present

## 2021-03-01 DIAGNOSIS — E119 Type 2 diabetes mellitus without complications: Secondary | ICD-10-CM | POA: Diagnosis not present

## 2021-03-02 DIAGNOSIS — E119 Type 2 diabetes mellitus without complications: Secondary | ICD-10-CM | POA: Diagnosis not present

## 2021-03-03 DIAGNOSIS — E119 Type 2 diabetes mellitus without complications: Secondary | ICD-10-CM | POA: Diagnosis not present

## 2021-03-06 DIAGNOSIS — R69 Illness, unspecified: Secondary | ICD-10-CM | POA: Diagnosis not present

## 2021-03-06 DIAGNOSIS — E119 Type 2 diabetes mellitus without complications: Secondary | ICD-10-CM | POA: Diagnosis not present

## 2021-03-07 DIAGNOSIS — G479 Sleep disorder, unspecified: Secondary | ICD-10-CM | POA: Diagnosis not present

## 2021-03-07 DIAGNOSIS — M25562 Pain in left knee: Secondary | ICD-10-CM | POA: Diagnosis not present

## 2021-03-07 DIAGNOSIS — E1165 Type 2 diabetes mellitus with hyperglycemia: Secondary | ICD-10-CM | POA: Diagnosis not present

## 2021-03-07 DIAGNOSIS — Z79891 Long term (current) use of opiate analgesic: Secondary | ICD-10-CM | POA: Diagnosis not present

## 2021-03-07 DIAGNOSIS — I10 Essential (primary) hypertension: Secondary | ICD-10-CM | POA: Diagnosis not present

## 2021-03-07 DIAGNOSIS — E119 Type 2 diabetes mellitus without complications: Secondary | ICD-10-CM | POA: Diagnosis not present

## 2021-03-07 DIAGNOSIS — Z794 Long term (current) use of insulin: Secondary | ICD-10-CM | POA: Diagnosis not present

## 2021-03-07 DIAGNOSIS — Z23 Encounter for immunization: Secondary | ICD-10-CM | POA: Diagnosis not present

## 2021-03-07 DIAGNOSIS — M79641 Pain in right hand: Secondary | ICD-10-CM | POA: Diagnosis not present

## 2021-03-07 DIAGNOSIS — G894 Chronic pain syndrome: Secondary | ICD-10-CM | POA: Diagnosis not present

## 2021-03-07 DIAGNOSIS — M545 Low back pain, unspecified: Secondary | ICD-10-CM | POA: Diagnosis not present

## 2021-03-07 DIAGNOSIS — I119 Hypertensive heart disease without heart failure: Secondary | ICD-10-CM | POA: Diagnosis not present

## 2021-03-07 DIAGNOSIS — N289 Disorder of kidney and ureter, unspecified: Secondary | ICD-10-CM | POA: Diagnosis not present

## 2021-03-07 DIAGNOSIS — M79642 Pain in left hand: Secondary | ICD-10-CM | POA: Diagnosis not present

## 2021-03-07 DIAGNOSIS — G44319 Acute post-traumatic headache, not intractable: Secondary | ICD-10-CM | POA: Diagnosis not present

## 2021-03-07 DIAGNOSIS — M25551 Pain in right hip: Secondary | ICD-10-CM | POA: Diagnosis not present

## 2021-03-07 DIAGNOSIS — J4540 Moderate persistent asthma, uncomplicated: Secondary | ICD-10-CM | POA: Diagnosis not present

## 2021-03-07 DIAGNOSIS — M8700 Idiopathic aseptic necrosis of unspecified bone: Secondary | ICD-10-CM | POA: Diagnosis not present

## 2021-03-07 DIAGNOSIS — M25561 Pain in right knee: Secondary | ICD-10-CM | POA: Diagnosis not present

## 2021-03-07 DIAGNOSIS — Z0001 Encounter for general adult medical examination with abnormal findings: Secondary | ICD-10-CM | POA: Diagnosis not present

## 2021-03-07 DIAGNOSIS — E1142 Type 2 diabetes mellitus with diabetic polyneuropathy: Secondary | ICD-10-CM | POA: Diagnosis not present

## 2021-03-07 NOTE — Patient Outreach (Signed)
[facility]) Care Management  03/07/2021  Riko Lumsden 1971/01/25 [id number]   Opened in error   Joelene Millin L. Lavina Hamman, RN, BSN, [place] Coordinator Office number [phone] Main [facility] number [phone] Fax number [phone]

## 2021-03-08 ENCOUNTER — Other Ambulatory Visit: Payer: Self-pay

## 2021-03-08 ENCOUNTER — Other Ambulatory Visit: Payer: Self-pay | Admitting: *Deleted

## 2021-03-08 DIAGNOSIS — E119 Type 2 diabetes mellitus without complications: Secondary | ICD-10-CM | POA: Diagnosis not present

## 2021-03-08 NOTE — Patient Outreach (Signed)
[facility]) Care Management  03/08/2021  Kynadee Dam 06/09/1971 [id number]   THN follow up with complex care patient   Ms Archana Eckman was referred to [facility] on 12/02/20 by Holland Falling MA Referral Reason: [facility] complex care-Tier 4/ [employer] [place] 5 ED visits, DM, COPD/Asthma, HTN Insurance: Holland Falling medicare    Mrs Newbrough is able to verify her HIPAA identifiers  Assessment Ms Fellenz states she is doing good today but she reports she continues in  Pain  She reports she is pending surgery for her left hip and then hernia repair She reports her Pain level today is a 9  She remains in bed today, resting  She was seen by the Pain clinic and pcp appt on 03/07/21 She is pending a call from her pcp on her lab results, kidney functioning She reports she has diligently been following her diabetic diet and taking in only water and no sodas  Diabetes Education on neuropathy, electrolyte balance for potassium, sodium, fluids She reports cbg values of 109 -112  She denies constipation but some loose stools  RN Cm educated her on constipation related to pain medications She voiced understanding   [employer] home visit  She reports she received a call  from [phone] about a home visit  She requests assistance to get more details about the home visit from [employer] to [place]  [facility] confirms the patient is not scheduled for home visit with a matrix provider She refers pt and RN CM to [employer] customer services to check on a possibly other agency used by [employer] to [employer] with pt [phone]  Spoke with Sharyn Lull who is able to confirm pt does have an Airline pilot home visit with a [facility], Haematologist, NP on March 25 2021 at 230 pm Saturday  [employer] number for pt to call is [phone] Sent this information to patient in a [facility] resource letter    Plans  Patient agrees to care plan and follow up within the next 30 business  days   Norris Brumbach L. Lavina Hamman, RN, BSN, [place] Coordinator Office number [phone] Main [facility] number [phone] Fax number [phone]

## 2021-03-09 DIAGNOSIS — E119 Type 2 diabetes mellitus without complications: Secondary | ICD-10-CM | POA: Diagnosis not present

## 2021-03-10 DIAGNOSIS — E119 Type 2 diabetes mellitus without complications: Secondary | ICD-10-CM | POA: Diagnosis not present

## 2021-03-13 ENCOUNTER — Ambulatory Visit: Payer: Self-pay | Admitting: Student

## 2021-03-13 DIAGNOSIS — E119 Type 2 diabetes mellitus without complications: Secondary | ICD-10-CM | POA: Diagnosis not present

## 2021-03-13 DIAGNOSIS — K429 Umbilical hernia without obstruction or gangrene: Secondary | ICD-10-CM | POA: Diagnosis not present

## 2021-03-13 DIAGNOSIS — E1165 Type 2 diabetes mellitus with hyperglycemia: Secondary | ICD-10-CM | POA: Diagnosis not present

## 2021-03-13 DIAGNOSIS — I10 Essential (primary) hypertension: Secondary | ICD-10-CM | POA: Diagnosis not present

## 2021-03-13 DIAGNOSIS — Z1211 Encounter for screening for malignant neoplasm of colon: Secondary | ICD-10-CM | POA: Diagnosis not present

## 2021-03-13 DIAGNOSIS — G4733 Obstructive sleep apnea (adult) (pediatric): Secondary | ICD-10-CM | POA: Diagnosis not present

## 2021-03-14 DIAGNOSIS — E119 Type 2 diabetes mellitus without complications: Secondary | ICD-10-CM | POA: Diagnosis not present

## 2021-03-15 DIAGNOSIS — E119 Type 2 diabetes mellitus without complications: Secondary | ICD-10-CM | POA: Diagnosis not present

## 2021-03-16 DIAGNOSIS — E119 Type 2 diabetes mellitus without complications: Secondary | ICD-10-CM | POA: Diagnosis not present

## 2021-03-17 DIAGNOSIS — E119 Type 2 diabetes mellitus without complications: Secondary | ICD-10-CM | POA: Diagnosis not present

## 2021-03-20 DIAGNOSIS — E119 Type 2 diabetes mellitus without complications: Secondary | ICD-10-CM | POA: Diagnosis not present

## 2021-03-21 ENCOUNTER — Ambulatory Visit: Payer: Self-pay | Admitting: General Surgery

## 2021-03-21 DIAGNOSIS — E119 Type 2 diabetes mellitus without complications: Secondary | ICD-10-CM | POA: Diagnosis not present

## 2021-03-21 DIAGNOSIS — K430 Incisional hernia with obstruction, without gangrene: Secondary | ICD-10-CM | POA: Diagnosis not present

## 2021-03-21 NOTE — H&P (Signed)
Chief Complaint: Follow-up (Right breast)       History of Present Illness: Diana Henderson is a 50 y.o. female who is seen today as an office consultation at the request of Dr. Pasty Arch for evaluation of Follow-up (Right breast) .     Patient follows back up today secondary to continued abdominal pain from hernia.  As previously noted patient had a previous incisional hernia repair, underwent mesh placement.  Subsequent patient had significant pain.  Patient was thought to have pain secondary to scar tissue and mesh.  Patient had explantation of mesh and lyse adhesions by Dr. Kae Heller in 2022.  Patient had a recurrence soon thereafter.  Patient states that she continues to have pain which she feels is coming from her hernia.  Patient did have a CT scan which revealed a 7 cm wide recurrent incisional hernia.   Patient was seen within the last month and we tried conservative management to include abdominal binder.  She states this did not help.  She states that her pain is getting worse, she is losing weight secondary to decreased p.o.   Patient is on chronic pain medication and has a pain contract.           Review of Systems: A complete review of systems was obtained from the patient.  I have reviewed this information and discussed as appropriate with the patient.  See HPI as well for other ROS.   Review of Systems  Constitutional: Negative for fever.  HENT: Negative for congestion.   Eyes: Negative for blurred vision.  Respiratory: Negative for cough, shortness of breath and wheezing.   Cardiovascular: Negative for chest pain and palpitations.  Gastrointestinal: Negative for heartburn.  Genitourinary: Negative for dysuria.  Musculoskeletal: Negative for myalgias.  Skin: Negative for rash.  Neurological: Negative for dizziness and headaches.  Psychiatric/Behavioral: Negative for depression and suicidal ideas.  All other systems reviewed and are negative.       Medical  History: Past Medical History Past Medical History: Diagnosis Date  Arthritis    Asthma, unspecified asthma severity, unspecified whether complicated, unspecified whether persistent    Diabetes mellitus without complication (CMS-HCC)    Hypertension    Sleep apnea        There is no problem list on file for this patient.     Past Surgical History History reviewed. No pertinent surgical history.     Allergies Allergies Allergen Reactions  Celery (Apium Graveolens) (Umbelliferae) Hives  Latex, Natural Rubber Itching     Powdered latex gloves when patient wears.   Powdered latex gloves when patient wears.      Penicillins Other (See Comments) and Rash     Reported by pt daughter Other reaction(s): vaginal issues        Current Outpatient Medications on File Prior to Visit Medication Sig Dispense Refill  albuterol 90 mcg/actuation inhaler INHALE 2 PUFFS BY MOUTH FOUR TIMES DAILY      amLODIPine (NORVASC) 10 MG tablet Take 1 tablet by mouth once daily      cyclobenzaprine (FLEXERIL) 10 MG tablet Take by mouth      DULoxetine (CYMBALTA) 60 MG DR capsule Take by mouth      ondansetron (ZOFRAN-ODT) 4 MG disintegrating tablet Take 4 mg by mouth every 8 (eight) hours as needed      oxyCODONE (ROXICODONE) 15 MG immediate release tablet Take by mouth      pregabalin (LYRICA) 25 MG capsule Take by mouth  No current facility-administered medications on file prior to visit.     Family History History reviewed. No pertinent family history.     Social History   Tobacco Use Smoking Status Never Smoker Smokeless Tobacco Never Used     Social History Social History    Socioeconomic History  Marital status: Single Tobacco Use  Smoking status: Never Smoker  Smokeless tobacco: Never Used Surveyor, mining Use: Never used Substance and Sexual Activity  Alcohol use: Not Currently  Drug use: Defer  Sexual activity: Defer      Objective:     There were no  vitals filed for this visit.  There is no height or weight on file to calculate BMI.   Physical Exam Constitutional:      Appearance: Normal appearance.  HENT:     Head: Normocephalic and atraumatic.     Mouth/Throat:     Mouth: Mucous membranes are moist.     Pharynx: Oropharynx is clear.  Eyes:     General: No scleral icterus.    Pupils: Pupils are equal, round, and reactive to light.  Cardiovascular:     Rate and Rhythm: Normal rate and regular rhythm.     Pulses: Normal pulses.     Heart sounds: No murmur heard.   No friction rub. No gallop.  Pulmonary:     Effort: Pulmonary effort is normal. No respiratory distress.     Breath sounds: Normal breath sounds. No stridor.  Abdominal:     General: Abdomen is flat.     Hernia: A hernia is present.  Musculoskeletal:        General: No swelling.  Skin:    General: Skin is warm.  Neurological:     General: No focal deficit present.     Mental Status: She is alert and oriented to person, place, and time. Mental status is at baseline.  Psychiatric:        Mood and Affect: Mood normal.        Thought Content: Thought content normal.        Judgment: Judgment normal.        Assessment and Plan: Diagnoses and all orders for this visit:   Recurrent incisional hernia with incarceration     Diana Henderson is a 50 y.o. female    1. We will proceed to the operating for a open lysis of adhesions, ex lap, open hernia repair with mesh. 2.  I discussed with her the risk and benefits of the procedure to include but not limited to: Infection, bleeding, damage surrounding structures, possible fistula, possible recurrence, possible continued pain.  Patient voiced understanding and wishes to proceed.

## 2021-03-22 DIAGNOSIS — E119 Type 2 diabetes mellitus without complications: Secondary | ICD-10-CM | POA: Diagnosis not present

## 2021-03-23 DIAGNOSIS — I119 Hypertensive heart disease without heart failure: Secondary | ICD-10-CM | POA: Diagnosis not present

## 2021-03-23 DIAGNOSIS — G629 Polyneuropathy, unspecified: Secondary | ICD-10-CM | POA: Diagnosis not present

## 2021-03-23 DIAGNOSIS — E1165 Type 2 diabetes mellitus with hyperglycemia: Secondary | ICD-10-CM | POA: Diagnosis not present

## 2021-03-23 DIAGNOSIS — J4540 Moderate persistent asthma, uncomplicated: Secondary | ICD-10-CM | POA: Diagnosis not present

## 2021-03-23 DIAGNOSIS — R69 Illness, unspecified: Secondary | ICD-10-CM | POA: Diagnosis not present

## 2021-03-23 DIAGNOSIS — E119 Type 2 diabetes mellitus without complications: Secondary | ICD-10-CM | POA: Diagnosis not present

## 2021-03-23 DIAGNOSIS — M8700 Idiopathic aseptic necrosis of unspecified bone: Secondary | ICD-10-CM | POA: Diagnosis not present

## 2021-03-23 DIAGNOSIS — Z794 Long term (current) use of insulin: Secondary | ICD-10-CM | POA: Diagnosis not present

## 2021-03-23 DIAGNOSIS — I10 Essential (primary) hypertension: Secondary | ICD-10-CM | POA: Diagnosis not present

## 2021-03-24 ENCOUNTER — Other Ambulatory Visit: Payer: Self-pay | Admitting: *Deleted

## 2021-03-24 ENCOUNTER — Other Ambulatory Visit: Payer: Self-pay

## 2021-03-24 DIAGNOSIS — E119 Type 2 diabetes mellitus without complications: Secondary | ICD-10-CM | POA: Diagnosis not present

## 2021-03-24 NOTE — Patient Outreach (Signed)
[facility]) Care Management  03/24/2021  Tarrin Menn Jun 25, 1970 [id number]   THN follow up with complex care patient   Ms Sharlize Hoar was referred to [facility] on 12/02/20 by Holland Falling MA Referral Reason: [facility] complex care-Tier 4/ [employer] [place] 5 ED visits, DM, COPD/Asthma, HTN Insurance: Holland Falling medicare    Mrs Azpeitia is able to verify her HIPAA identifiers   Assessment She reports she is doing fair today  She continues to have neuropathy pain She is at her daughter home today  Saw primary care provider (PCP) on 03/23/21  Pending procedures Hip surgery scheduled for 04/12/21 Hernia surgery will be scheduled after this   Colonoscopy on Tuesday 03/28/21   [employer] nurse home visit is scheduled for Saturday 03/25/21 between 1430-1530  She wants to make sure the nurse will be coming to her daughter's apartment vs her apartment She received a text message from [phone] as a reminder but did not return a call to confirm the change in address Her daughter's address was entered in EPIC She was assisted to call [employer]'s [employer]  Spoke with Alease Frame at [phone] to confirm the daughter's address is the correct address for the home visit   durable medical equipment (DME) interest Has a fit bit that has been disconnected and pt will not be charged for it  Ms Kueker is interested in obtaining a [employer] RN CM reviewed the general process and medicare guidelines to get one   She reports she has used a cane, walker and wheelchair but not a scooter  She reports use of Neurontin and Lyrica for neuropathy pain and present pain management treatment She voices concern that she does not want to be addicted to pain medication and does not always know when she is stepping on items because she has numbness in her feet She is scheduled to return to the pain clinic on 04/04/21 at [employer] benefits/RN CM interventions Outreached with her to [place] services to find  out about her power wheel chair and food benefit card  Spoke with [place] at [phone]  She shared that Ms Vanderberg's MD is responsible for authorization. The MD will provide a prescription (Rx) or order, completed forms for [facility] and send a claim to [place] has a $0 deductible, $0 co pay She as the member does not pay anything as she has [employer] and medicaid (Aetna D-SNP) [place] answered Ms Sterkel's questions -Holland Falling works with [employer] provided the number [phone] to outreach to [employer] take less than 45 days to process  [place] assisted with an outreach to [place] at [place] to check on the status of food benefit card ($50-75) Pt was informed it takes 1-3 weeks for delivery and it was mailed to her on March 21 2021 They anticipate she may receive it around November 4-18 2022. Pt voiced understanding   RN CM Outreached to Mickel Baas at [place] recommends also outreach to [employer] at [phone]  RN CM provided pt demographics Pt to be contacted to more details to include her social security information She will need an appointment with preferably her primary care provider (PCP) for a face to face for  power wheel chair appointment. Mickel Baas reports it can be with either the orthopedic MD or pcp but best for the pcp to complete the forms There is no store front location the patient will be able to visit. The main  [employer] office is in [place] to patient to updated her and with her to  Benito Mccreedy, MD office to assist with scheduling a face to face for the power wheel chair visit on Thursday 03/30/21 at 1045   Outreach to [place] at [facility] to provide the pcp name, fax number, pt appointment and pt number so forms can be sent to pcp and pt outreached for powered device Patient Active Problem List   Diagnosis Date Noted   Aseptic necrosis of bone ([place]) 01/09/2021   Chronic obstructive pulmonary disease ([place]) 09/15/2020    Deep venous thrombosis of peroneal vein ([place]) 09/15/2020   Dysphagia 09/15/2020   Excessive and frequent menstruation 09/15/2020   Gastro-esophageal reflux disease without esophagitis 09/15/2020   Hyperglycemia due to type 2 diabetes mellitus ([place]) 09/15/2020   Hypertensive heart disease without congestive heart failure 09/15/2020   Long term (current) use of insulin ([place]) 09/15/2020   Moderate persistent asthma, uncomplicated 19/37/9024   Primary osteoarthritis, unspecified ankle and foot 09/15/2020   Other chronic pain 07/25/2020   Chronic low back pain 07/25/2020   Body mass index (BMI) 40.0-44.9, adult ([place]) 06/29/2020   Diabetic polyneuropathy associated with type 2 diabetes mellitus ([place]) 08/25/2019   Plantar fasciitis, left 08/25/2019   Tendonitis of ankle or foot 08/25/2019   Tight heel cords, acquired, bilateral 08/25/2019   Physical deconditioning 02/03/2018   Neck pain 10/16/2017   Dyslipidemia 10/22/2016   Left rotator cuff tear 07/31/2016   Diabetes mellitus, insulin dependent (IDDM), uncontrolled 09/18/2015   AKI (acute kidney injury) ([place]) 09/18/2015   Morbid obesity ([place]) 09/18/2015   Asthma 09/18/2015   Uncontrolled type 2 diabetes mellitus with complication    Chronic diastolic CHF (congestive heart failure), NYHA class 1 (HCC)    PTSD (post-traumatic stress disorder)    Bipolar I disorder ([place])    Anxiety state    Knee pain, chronic    HTN (hypertension), malignant 09/08/2014   Cervical spondylosis with myelopathy 07/07/2014   HNP (herniated nucleus pulposus) with myelopathy, cervical 06/29/2014   Deformity of metatarsal bone of right foot 06/28/2014   CTS (carpal tunnel syndrome) 05/04/2014   OSA (obstructive sleep apnea) 05/04/2014   Hereditary and idiopathic peripheral neuropathy 05/04/2014   Ganglion cyst 03/10/2014   Vision loss, left eye 02/18/2014   Conjunctivitis 02/17/2014   Asthma exacerbation 01/13/2014   Abdominal pain, unspecified site  10/28/2013   Injury of foot, right, superficial 10/23/2013   Tenosynovitis of foot 08/17/2013   Pain in joint, ankle and foot 02/17/2013   Edema of foot 02/17/2013   Status post foot surgery 09/09/2012   Hyperlipidemia    Major depressive disorder, recurrent, 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[place]) 02/25/2012    Plans Patient agrees to care plan and follow up within the next 30 business days  Goals Addressed               This Visit's Progress     Patient Stated     Cope with Chronic Pain (THN) (pt-stated)   On track     Timeframe:  Long-Range Goal Priority:  Low Start Date:                01/09/21             Expected End Date:   05/17/21                    Follow Up Date 03/20/2021  Barriers: Health Behaviors Knowledge    - practice  acceptance of chronic pain - use relaxation during pain     Notes:  03/24/21 continues in pain, primarily due to neuropathy and pending hip surgery for 04/12/21 (dr B Swinteck)  02/22/21 remains in pain, pending left hip surgery, was post pone, brief outreach with pt 01/17/21 reports pain level at 10, completed MRI with pending results from her pcp and taking of pain medicines every 4 hours as ordered for lower back and hernia pain  agreed to consider breakthrough tylenol and other alternative pain options 01/09/21 reports she has pain management doctor, reports low back neuropathy pain from hip injury       Find Help in My Community(THN) (DME) (pt-stated)   On track     Timeframe:  Long-Range Goal Priority:  High Start Date:              01/09/21                Expected End Date:    06/16/21                  Barriers: Knowledge  Follow Up Date 03/23/2021   - begin a notebook of services in my neighborhood or community - call 211 when I need some help - follow-up on any referrals for help I am given - make a list of family or friends that I can call   Notes:  107/22 voiced interest in power wheel chair. Agreed to outreach with RN CM to [employer],  [employer] and pcp. Has contact numbers to return calls Having [place] home visit on 03/25/21 02/22/21 brief outreach with pt 01/17/21 agreed to complete a conference call with RN CM to [facility] customer service to inquire about her OTC benefit, to get a new OTC benefit catalog as she can not find the 2022 one sent to her. She asked pertinent questions during the outreach and later returned a call to RN CM to confirm her pcp office had [employer] to bring a new one touch glucometer today She is to have pcp write prescription for more test strips and pick up a BP cuff at local [facility]   01/11/21 Agreed to conference call with pcp to schedule tele-visit to be assessed for walker, BP cuff and cbg monitor Updated RN CM r/t colonoscopy, MRI upcoming appointments 01/09/21 reports need of cbg & BP monitors, walker ? scooter      Monitor and Manage My Blood Sugar-Diabetes Type 2 (THN) (pt-stated)   On track     Timeframe:  Long-Range Goal Priority:  Medium Start Date:               01/09/21              Expected End Date:       04/14/21                Follow Up Date 03/23/2021 Barriers: Knowledge   - check blood sugar at prescribed times     Notes:  03/24/21 continues to manage well at home  02/22/21 outreach brief Noted 02/14/21 HgA1c of 6.3 improved  01/17/21 She asked pertinent questions during the outreach to [employer]  and later returned a call to RN CM to confirm her pcp office had [employer] to bring a new one touch glucometer today She is to have pcp write prescription for more test strips and pick up a BP cuff [facility] local [facility]  01/11/21 Agreed to conference call with pcp to schedule tele-visit to be  assessed for walker, BP cuff and cbg monitor Updated RN CM r/t colonoscopy, MRI upcoming appointments 01/09/21 reports discrepancies with availability of home monitoring device Had [employer] reporting not covered by [place]. Lavina Hamman, RN, BSN, [place]  Coordinator Office number [phone] Main [facility] number [phone] Fax number [phone]

## 2021-03-25 DIAGNOSIS — R69 Illness, unspecified: Secondary | ICD-10-CM | POA: Diagnosis not present

## 2021-03-25 DIAGNOSIS — E785 Hyperlipidemia, unspecified: Secondary | ICD-10-CM | POA: Diagnosis not present

## 2021-03-25 DIAGNOSIS — Z008 Encounter for other general examination: Secondary | ICD-10-CM | POA: Diagnosis not present

## 2021-03-25 DIAGNOSIS — I10 Essential (primary) hypertension: Secondary | ICD-10-CM | POA: Diagnosis not present

## 2021-03-25 DIAGNOSIS — G4733 Obstructive sleep apnea (adult) (pediatric): Secondary | ICD-10-CM | POA: Diagnosis not present

## 2021-03-25 DIAGNOSIS — Z794 Long term (current) use of insulin: Secondary | ICD-10-CM | POA: Diagnosis not present

## 2021-03-25 DIAGNOSIS — E1142 Type 2 diabetes mellitus with diabetic polyneuropathy: Secondary | ICD-10-CM | POA: Diagnosis not present

## 2021-03-25 DIAGNOSIS — J449 Chronic obstructive pulmonary disease, unspecified: Secondary | ICD-10-CM | POA: Diagnosis not present

## 2021-03-25 DIAGNOSIS — G8929 Other chronic pain: Secondary | ICD-10-CM | POA: Diagnosis not present

## 2021-03-25 DIAGNOSIS — E669 Obesity, unspecified: Secondary | ICD-10-CM | POA: Diagnosis not present

## 2021-03-27 DIAGNOSIS — E119 Type 2 diabetes mellitus without complications: Secondary | ICD-10-CM | POA: Diagnosis not present

## 2021-03-28 DIAGNOSIS — R109 Unspecified abdominal pain: Secondary | ICD-10-CM | POA: Diagnosis not present

## 2021-03-28 DIAGNOSIS — R1033 Periumbilical pain: Secondary | ICD-10-CM | POA: Diagnosis not present

## 2021-03-28 DIAGNOSIS — Z743 Need for continuous supervision: Secondary | ICD-10-CM | POA: Diagnosis not present

## 2021-03-28 DIAGNOSIS — R1084 Generalized abdominal pain: Secondary | ICD-10-CM | POA: Diagnosis not present

## 2021-03-28 DIAGNOSIS — R000 Tachycardia, unspecified: Secondary | ICD-10-CM | POA: Diagnosis not present

## 2021-03-28 DIAGNOSIS — I10 Essential (primary) hypertension: Secondary | ICD-10-CM | POA: Diagnosis not present

## 2021-03-28 DIAGNOSIS — E119 Type 2 diabetes mellitus without complications: Secondary | ICD-10-CM | POA: Diagnosis not present

## 2021-03-29 DIAGNOSIS — E119 Type 2 diabetes mellitus without complications: Secondary | ICD-10-CM | POA: Diagnosis not present

## 2021-03-30 DIAGNOSIS — E119 Type 2 diabetes mellitus without complications: Secondary | ICD-10-CM | POA: Diagnosis not present

## 2021-03-30 NOTE — Patient Instructions (Signed)
DUE TO COVID-19 ONLY ONE VISITOR IS ALLOWED TO COME WITH YOU AND STAY IN THE WAITING ROOM ONLY DURING PRE OP AND PROCEDURE DAY OF SURGERY IF YOU ARE GOING HOME AFTER SURGERY. IF YOU ARE SPENDING THE NIGHT 2 PEOPLE MAY VISIT WITH YOU IN YOUR PRIVATE ROOM AFTER SURGERY UNTIL VISITING  HOURS ARE OVER AT 8:00 PM AND 1 VISITOR CAN SPEND THE NIGHT.   YOU NEED TO HAVE A COVID 19 TEST ON_10/24___ THIS TEST MUST BE DONE BEFORE SURGERY,  COVID TESTING SITE  IS LOCATED AT [place], [place]. REMAIN IN YOUR CAR THIS IS A DRIVE UP TEST. AFTER YOUR COVID TEST PLEASE WEAR A MASK OUT IN PUBLIC AND SOCIAL DISTANCE AND [place] YOUR HANDS FREQUENTLY, ALSO ASK ALL YOUR CLOSE CONTACT PERSONS TO WEAR A MASK AND SOCIAL DISTANCE AND [place] THEIR HANDS FREQUENTLY ALSO.               Diana Henderson     Your procedure is scheduled on: 04/12/21   Report to [facility] Main  Entrance   Report to short Stay at 5:15 AM     Call this number if you have problems the morning of surgery [phone]    No food after midnight.    You may have clear liquid until 4:30 AM.    At 4:00 AM drink pre surgery drink.   Nothing by mouth after 4:30 AM.   CLEAR LIQUID DIET   Foods Allowed                                                                     Foods Excluded                                                                                        liquids that you cannot  Plain Jell-O any favor except red or purple                                           see through such as: Fruit ices (not with fruit pulp)                                     milk, soups, orange juice  Iced Popsicles                                    All solid food Carbonated beverages, regular and diet                                    Cranberry, grape  and apple juices Sports drinks like Gatorade Lightly seasoned clear broth or consume(fat free) Sugar     BRUSH YOUR TEETH MORNING OF SURGERY AND RINSE YOUR MOUTH OUT, NO  CHEWING GUM CANDY OR MINTS.     Take these medicines the morning of surgery with A SIP OF WATER: Cymbalta, Amlodipine. Use your inhaler and bring it with you. Bring your mask and tubing.                    How to Manage Your Diabetes Before and After Surgery  Why is it important to control my blood sugar before and after surgery? Improving blood sugar levels before and after surgery helps healing and can limit problems. A way of improving blood sugar control is eating a healthy diet by:  Eating less sugar and carbohydrates  Increasing activity/exercise  Talking with your doctor about reaching your blood sugar goals High blood sugars (greater than 180 mg/dL) can raise your risk of infections and slow your recovery, so you will need to focus on controlling your diabetes during the weeks before surgery. Make sure that the doctor who takes care of your diabetes knows about your planned surgery including the date and location.  How do I manage my blood sugar before surgery? Check your blood sugar at least 4 times a day, starting 2 days before surgery, to make sure that the level is not too high or low. Check your blood sugar the morning of your surgery when you wake up and every 2 hours until you get to the Short Stay unit. If your blood sugar is less than 70 mg/dL, you will need to treat for low blood sugar: Do not take insulin. Treat a low blood sugar (less than 70 mg/dL) with  cup of clear juice (cranberry or apple), 4 glucose tablets, OR glucose gel. Recheck blood sugar in 15 minutes after treatment (to make sure it is greater than 70 mg/dL). If your blood sugar is not greater than 70 mg/dL on recheck, call [phone] for further instructions. Report your blood sugar to the short stay nurse when you get to Short Stay.  If you are admitted to the hospital after surgery: Your blood sugar will be checked by the staff and you will probably be given insulin after surgery (instead of oral  diabetes medicines) to make sure you have good blood sugar levels. The goal for blood sugar control after surgery is 80-180 mg/dL.   WHAT DO I DO ABOUT MY DIABETES MEDICATION?  The day before surgery take 10 units of Levemir insulin in the AM which is your usual dose  THE NIGHT BEFORE SURGERY, take  5    units of   Levemir  insulin.(50% of your dose)      THE MORNING OF SURGERY, take  5  units of  Levemir  insulin.(50% of your dose)                  You may not have any metal on your body including hair pins and              piercings  Do not wear jewelry, make-up, lotions, powders or perfumes, deodorant             Do not wear nail polish on your fingernails.  Do not shave  48 hours prior to surgery.                 Do not bring valuables to the  hospital. [facility] IS NOT             RESPONSIBLE   FOR VALUABLES.  Contacts, dentures or bridgework may not be worn into surgery.  Leave suitcase in the car. After surgery it may be brought to your room.     Name and phone number of your driver:  Special Instructions: N/A              Please read over the following fact sheets you were given: _____________________________________________________________________              [facility] [place] - Preparing for Surgery Before surgery, you can play an important role.  Because skin is not sterile, your skin needs to be as free of germs as possible.  You can reduce the number of germs on your skin by washing with CHG (chlorahexidine gluconate) soap before surgery.  CHG is an antiseptic cleaner which kills germs and bonds with the skin to continue killing germs even after washing. Please DO NOT use if you have an allergy to CHG or antibacterial soaps.  If your skin becomes reddened/irritated stop using the CHG and inform your nurse when you arrive at Short Stay. Do not shave (including legs and underarms) for at least 48 hours prior to the first CHG shower.  Please follow these instructions  carefully:  1.  Shower with CHG Soap the night before surgery and the  morning of Surgery.  2.  If you choose to wash your hair, wash your hair first as usual with your  normal  shampoo.  3.  After you shampoo, rinse your hair and body thoroughly to remove the  shampoo.                            4.  Use CHG as you would any other liquid soap.  You can apply chg directly  to the skin and wash                       Gently with a scrungie or clean washcloth.  5.  Apply the CHG Soap to your body ONLY FROM THE NECK DOWN.   Do not use on face/ open                           Wound or open sores. Avoid contact with eyes, ears mouth and genitals (private parts).                       Wash face,  Genitals (private parts) with your normal soap.             6.  Wash thoroughly, paying special attention to the area where your surgery  will be performed.  7.  Thoroughly rinse your body with warm water from the neck down.  8.  DO NOT shower/wash with your normal soap after using and rinsing off  the CHG Soap.                9.  Pat yourself dry with a clean towel.            10.  Wear clean pajamas.            11.  Place clean sheets on your bed the night of your first shower and do not  sleep with pets. Day of Surgery : Do  not apply any lotions/deodorants the morning of surgery.  Please wear clean clothes to the hospital/surgery center.  FAILURE TO FOLLOW THESE INSTRUCTIONS MAY RESULT IN THE CANCELLATION OF YOUR SURGERY PATIENT SIGNATURE_________________________________  NURSE SIGNATURE__________________________________  ________________________________________________________________________

## 2021-03-31 ENCOUNTER — Encounter (HOSPITAL_COMMUNITY)
Admission: RE | Admit: 2021-03-31 | Discharge: 2021-03-31 | Disposition: A | Discharge status: Home / Self Care | Payer: Medicare HMO | Source: Ambulatory Visit | Attending: Orthopedic Surgery | Admitting: Orthopedic Surgery

## 2021-03-31 ENCOUNTER — Other Ambulatory Visit: Payer: Self-pay

## 2021-03-31 ENCOUNTER — Ambulatory Visit: Payer: Self-pay | Admitting: Student

## 2021-03-31 ENCOUNTER — Encounter (HOSPITAL_COMMUNITY): Payer: Self-pay

## 2021-03-31 DIAGNOSIS — Z01818 Encounter for other preprocedural examination: Secondary | ICD-10-CM | POA: Diagnosis not present

## 2021-03-31 DIAGNOSIS — Z87891 Personal history of nicotine dependence: Secondary | ICD-10-CM | POA: Diagnosis not present

## 2021-03-31 DIAGNOSIS — I10 Essential (primary) hypertension: Secondary | ICD-10-CM | POA: Diagnosis not present

## 2021-03-31 DIAGNOSIS — E118 Type 2 diabetes mellitus with unspecified complications: Secondary | ICD-10-CM | POA: Diagnosis not present

## 2021-03-31 DIAGNOSIS — Z794 Long term (current) use of insulin: Secondary | ICD-10-CM | POA: Diagnosis not present

## 2021-03-31 DIAGNOSIS — M879 Osteonecrosis, unspecified: Secondary | ICD-10-CM | POA: Insufficient documentation

## 2021-03-31 DIAGNOSIS — Z79899 Other long term (current) drug therapy: Secondary | ICD-10-CM | POA: Diagnosis not present

## 2021-03-31 DIAGNOSIS — Z7901 Long term (current) use of anticoagulants: Secondary | ICD-10-CM | POA: Diagnosis not present

## 2021-03-31 DIAGNOSIS — G4733 Obstructive sleep apnea (adult) (pediatric): Secondary | ICD-10-CM | POA: Insufficient documentation

## 2021-03-31 DIAGNOSIS — E119 Type 2 diabetes mellitus without complications: Secondary | ICD-10-CM | POA: Diagnosis not present

## 2021-03-31 LAB — COMPREHENSIVE METABOLIC PANEL
ALT: 9 U/L (ref 0–44)
AST: 12 U/L — ABNORMAL LOW (ref 15–41)
Albumin: 3.6 g/dL (ref 3.5–5.0)
Alkaline Phosphatase: 63 U/L (ref 38–126)
Anion gap: 8 (ref 5–15)
BUN: 11 mg/dL (ref 6–20)
CO2: 28 mmol/L (ref 22–32)
Calcium: 9.2 mg/dL (ref 8.9–10.3)
Chloride: 99 mmol/L (ref 98–111)
Creatinine, Ser: 1.02 mg/dL — ABNORMAL HIGH (ref 0.44–1.00)
GFR, Estimated: >60 mL/min (ref >60)
Glucose, Bld: 147 mg/dL — ABNORMAL HIGH (ref 70–99)
Potassium: 3.9 mmol/L (ref 3.5–5.1)
Sodium: 135 mmol/L (ref 135–145)
Total Bilirubin: 0.6 mg/dL (ref 0.3–1.2)
Total Protein: 7 g/dL (ref 6.5–8.1)

## 2021-03-31 LAB — SURGICAL PCR SCREEN
MRSA, PCR: NEGATIVE
Staphylococcus aureus: NEGATIVE

## 2021-03-31 LAB — CBC
HCT: 44.3 % (ref 36.0–46.0)
Hemoglobin: 14.1 g/dL (ref 12.0–15.0)
MCH: 26.8 pg (ref 26.0–34.0)
MCHC: 31.8 g/dL (ref 30.0–36.0)
MCV: 84.1 fL (ref 80.0–100.0)
Platelets: 165 10*3/uL (ref 150–400)
RBC: 5.27 MIL/uL — ABNORMAL HIGH (ref 3.87–5.11)
RDW: 14.9 % (ref 11.5–15.5)
WBC: 5.6 10*3/uL (ref 4.0–10.5)
nRBC: 0 % (ref 0.0–0.2)

## 2021-03-31 LAB — PROTIME-INR
INR: 1 (ref 0.8–1.2)
Prothrombin Time: 13.6 seconds (ref 11.4–15.2)

## 2021-03-31 LAB — TYPE AND SCREEN
ABO/RH(D): B POS
Antibody Screen: NEGATIVE

## 2021-03-31 LAB — GLUCOSE, CAPILLARY: Glucose-Capillary: 165 mg/dL — ABNORMAL HIGH (ref 70–99)

## 2021-03-31 NOTE — H&P (Signed)
TOTAL HIP ADMISSION H&P  Patient is admitted for left total hip arthroplasty.   Subjective:   Chief Complaint: left hip pain   HPI: Diana Henderson, 50 y.o. female, has a history of pain and functional disability in the left hip(s) due to arthritis and patient has failed non-surgical conservative treatments for greater than 12 weeks to include NSAID's and/or analgesics and activity modification.  Onset of symptoms was gradual starting 2 years ago with gradually worsening course since that time.The patient noted no past surgery on the left hip(s).  Patient currently rates pain in the left hip at 10 out of 10 with activity. Patient has worsening of pain with activity and weight bearing, pain that interfers with activities of daily living, and pain with passive range of motion. Patient has evidence of  avascular necrosis  by imaging studies. This condition presents safety issues increasing the risk of falls. There is no current active infection.  Patient Active Problem List   Diagnosis Date Noted   Aseptic necrosis of bone ([place]) 01/09/2021   Chronic obstructive pulmonary disease ([place]) 09/15/2020   Deep venous thrombosis of peroneal vein ([place]) 09/15/2020   Dysphagia 09/15/2020   Excessive and frequent menstruation 09/15/2020   Gastro-esophageal reflux disease without esophagitis 09/15/2020   Hyperglycemia due to type 2 diabetes mellitus ([place]) 09/15/2020   Hypertensive heart disease without congestive heart failure 09/15/2020   Long term (current) use of insulin ([employer]) 09/15/2020   Moderate persistent asthma, uncomplicated 50/35/4656   Primary osteoarthritis, unspecified ankle and foot 09/15/2020   Other chronic pain 07/25/2020   Chronic low back pain 07/25/2020   Body mass index (BMI) 40.0-44.9, adult ([place]) 06/29/2020   Diabetic polyneuropathy associated with type 2 diabetes mellitus ([place]) 08/25/2019   Plantar fasciitis, left 08/25/2019   Tendonitis of ankle or foot 08/25/2019   Tight heel  cords, acquired, bilateral 08/25/2019   Physical deconditioning 02/03/2018   Neck pain 10/16/2017   Dyslipidemia 10/22/2016   Left rotator cuff tear 07/31/2016   Diabetes mellitus, insulin dependent (IDDM), uncontrolled 09/18/2015   AKI (acute kidney injury) ([place]) 09/18/2015   Morbid obesity ([place]) 09/18/2015   Asthma 09/18/2015   Uncontrolled type 2 diabetes mellitus with complication    Chronic diastolic CHF (congestive heart failure), NYHA class 1 ([place])    PTSD (post-traumatic stress disorder)    Bipolar I disorder ([place])    Anxiety state    Knee pain, chronic    HTN (hypertension), malignant 09/08/2014   Cervical spondylosis with myelopathy 07/07/2014   HNP (herniated nucleus pulposus) with myelopathy, cervical 06/29/2014   Deformity of metatarsal bone of right foot 06/28/2014   CTS (carpal tunnel syndrome) 05/04/2014   OSA (obstructive sleep apnea) 05/04/2014   Hereditary and idiopathic peripheral neuropathy 05/04/2014   Ganglion cyst 03/10/2014   Vision loss, left eye 02/18/2014   Conjunctivitis 02/17/2014   Asthma exacerbation 01/13/2014   Abdominal pain, unspecified site 10/28/2013   Injury of foot, right, superficial 10/23/2013   Tenosynovitis of foot 08/17/2013   Pain in joint, ankle and foot 02/17/2013   Edema of foot 02/17/2013   Status post foot surgery 09/09/2012   Hyperlipidemia    Major depressive disorder, recurrent, 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 by Dr. Gwenette Greet- last 06/2014   Bipolar 1 disorder ([place])    Complication of anesthesia    09/13/14:  malignant HTN with inducction, case cx but no sourse identified; 11/05/14 remifentanyl infusion used to attenuate hypergynamic induction   Depression    Diabetes mellitus    GERD (gastroesophageal reflux disease)    Hyperlipidemia    Hypertension    Knee pain, chronic    OSA on CPAP    doesn't use  it every night   Pain in joint, ankle and foot 02/17/2013   Pneumonia    hosp. for a couple of day- not sure when it was    PTSD (post-traumatic stress disorder)    Rotator cuff tear    bicep tendon    Shortness of breath dyspnea     Past Surgical History:  Procedure Laterality Date   ANTERIOR CERVICAL DECOMP/DISCECTOMY FUSION N/A 11/05/2014   Procedure: ANTERIOR CERVICAL DECOMPRESSION/DISCECTOMY FUSION 1 LEVEL;  Surgeon: Consuella Lose, MD;  Location: [facility];  Service: Neurosurgery;  Laterality: N/A;  Cervical five- cervical seven anterior cervical decompression with fusion interbody prosthesis plating and bonegraft   CARPAL TUNNEL RELEASE Left 2001   Left wrist   CERVICAL LAMINECTOMY  03/31/20176   CERVICAL 4 SEVEN LAMINECTOMIES WITH LATERAL FUSION   [place] Osteotomy w/ Bone Graft Right 09/30/2014   Rt foot @ [facility]   GASTROC RECESSION EXTREMITY Right 3//20/14   Right foot   HERNIA REPAIR     1996   LAPAROSCOPY N/A 08/23/2020   Procedure: LAPAROSCOPIC LYSIS OF ADHESIONS AND OPEN REMOVAL OF ABDOMINAL WALL MESH;  Surgeon: Clovis Riley, MD;  Location: [facility];  Service: General;  Laterality: N/A;  90   Lapidus fusion Right 01/10/12   Rt foot   MIDTARSAL ARTHRODESIS Right 07/17/12   Rt foot   POSTERIOR CERVICAL FUSION/FORAMINOTOMY N/A 09/16/2015   Procedure: Cervical four - seven laminectomies with lateral mass fusion;  Surgeon: Consuella Lose, MD;  Location: [facility];  Service: Neurosurgery;  Laterality: N/A;  C4-7 laminectomy with lateral mass fusion   Remove implant deep Right 07/17/12   Rt foot   Remove Implant Deep Right 07/22/2014   Rt foot @ [facility]   SHOULDER ARTHROSCOPY WITH ROTATOR CUFF REPAIR AND SUBACROMIAL DECOMPRESSION Left 07/31/2016   Procedure: LEFT SHOULDER ARTHROSCOPY WITH ROTATOR CUFF REPAIR;  Surgeon: Renette Butters, MD;  Location: [facility];  Service: Orthopedics;  Laterality: Left;   TUBAL LIGATION      Current Outpatient Medications  Medication Sig  Dispense Refill Last Dose   amLODipine (NORVASC) 10 MG tablet Take 10 mg by mouth daily.       atenolol-chlorthalidone (TENORETIC) 100-25 MG tablet Take 1 tablet by mouth daily.      BD PEN NEEDLE NANO 2ND GEN 32G X 4 MM MISC See admin instructions.      budesonide-formoterol (SYMBICORT) 160-4.5 MCG/ACT inhaler Inhale 2 puffs into the lungs 2 (two) times daily. (Patient taking differently: Inhale 2 puffs into the lungs 2 (two) times daily as needed (wheezing).) 1 Inhaler 0    capsaicin (ZOSTRIX) 0.025 % cream Apply topically 2 (two) times daily. (Patient not taking: Reported on 12/07/2020) 60 g 0    cyclobenzaprine (FLEXERIL) 10 MG tablet Take 10 mg by mouth every 12 (twelve) hours as needed for muscle spasms.      diazepam (VALIUM) 2 MG tablet Take 1 tablet (2 mg total) by mouth every 6 (six) hours as needed for muscle spasms. (Patient not taking: Reported on 03/29/2021) 30 tablet 0    DULoxetine (CYMBALTA) 20 MG capsule Take 20 mg by mouth daily.  glucose blood (ACCU-CHEK AVIVA PLUS) test strip TEST FOUR TIMES DAILY      LEVEMIR FLEXTOUCH 100 UNIT/ML FlexPen Inject 10 Units into the skin 2 (two) times daily.      mirtazapine (REMERON) 15 MG tablet Take 15 mg by mouth at bedtime.      ondansetron (ZOFRAN ODT) 4 MG disintegrating tablet Take 1 tablet (4 mg total) by mouth every 8 (eight) hours as needed for nausea or vomiting. (Patient not taking: No sig reported) 12 tablet 0    oxyCODONE (ROXICODONE) 15 MG immediate release tablet Take 15 mg by mouth in the morning, at noon, in the evening, and at bedtime.      pregabalin (LYRICA) 25 MG capsule Take 25 mg by mouth at bedtime.      VENTOLIN HFA 108 (90 Base) MCG/ACT inhaler INHALE 1 TO 2 PUFFS INTO THE LUNGS EVERY 6 (SIX) HOURS AS NEEDED FOR WHEEZING OR SHORTNESS OF BREATH. 18 each 2    No current facility-administered medications for this visit.   Allergies  Allergen Reactions   Celery (Apium Graveolens Var. Dulce) Skin Test Hives   Celery  Oil Hives   Pork-Derived Products Nausea And Vomiting and Other (See Comments)    headache   Penicillin G     Other reaction(s): vaginal issues   Latex Itching    Powdered latex gloves when patient wears.     Other Itching and Rash    PLEASE USE COBAN WRAP, IF POSSIBLE Powdered latex gloves when patient wears.     Penicillins Rash    Reported by pt daughter   Tape Itching and Rash    PLEASE USE COBAN WRAP, IF POSSIBLE    Social History   Tobacco Use   Smoking status: Former    Packs/day: 2.00    Years: 6.00    Pack years: 12.00    Types: Cigarettes    Quit date: 06/18/1978    Years since quitting: 42.8   Smokeless tobacco: Never  Substance Use Topics   Alcohol use: Not Currently    Family History  Problem Relation Age of Onset   Kidney disease Mother    Heart attack Father      Review of Systems  Constitutional: Negative.   HENT: Negative.    Eyes: Negative.   Respiratory: Negative.    Cardiovascular: Negative.   Gastrointestinal: Negative.   Endocrine: Negative.   Genitourinary: Negative.   Musculoskeletal:  Positive for arthralgias, back pain and neck pain.  Skin: Negative.   Allergic/Immunologic: Negative.   Neurological:  Positive for weakness and numbness  Objective:  Physical Exam Constitutional:      Appearance: She is obese.  HENT:     Head: Normocephalic.  Eyes:     Pupils: Pupils are equal, round, and reactive to light.  Cardiovascular:     Rate and Rhythm: Normal rate.     Pulses: Normal pulses.  Pulmonary:     Effort: Pulmonary effort is normal.  Abdominal:     Palpations: Abdomen is soft.     Tenderness: There is no abdominal tenderness.  Genitourinary:    Comments: Deferred Musculoskeletal:     Cervical back: Normal range of motion.     Comments: Painful ROM L hip  Skin:    General: Skin is warm and dry.  Neurological:     Mental Status: She is alert and oriented to person, place, and time.  Psychiatric:        Behavior: Behavior  normal.  Vital  signs in last 24 hours: @VSRANGES @  Labs:   Estimated body mass index is 30.79 kg/m as calculated from the following:   Height as of 02/14/21: 5\' 5"  (1.651 m).   Weight as of 02/14/21: 83.9 kg.   Imaging Review Plain radiographs demonstrate severe degenerative joint disease of the left hip(s). The bone quality appears to be adequate for age and reported activity level.           Assessment/Plan:   End stage arthritis, left hip(s)   The patient history, physical examination, clinical judgement of the provider and imaging studies are consistent with end stage degenerative joint disease of the left hip(s) and total hip arthroplasty is deemed medically necessary. The treatment options including medical management, injection therapy, arthroscopy and arthroplasty were discussed at length. The risks and benefits of total hip arthroplasty were presented and reviewed. The risks due to aseptic loosening, infection, stiffness, dislocation/subluxation,  thromboembolic complications and other imponderables were discussed.  The patient acknowledged the explanation, agreed to proceed with the plan and consent was signed. Patient is being admitted for inpatient treatment for surgery, pain control, PT, OT, prophylactic antibiotics, VTE prophylaxis, progressive ambulation and ADL's and discharge planning.The patient is planning to be discharged  home after overnight observation

## 2021-03-31 NOTE — Progress Notes (Signed)
COVID test 10/24   PCP - Dr. Vista Lawman LOV 11/16/20 Cardiologist - no  Chest x-ray - no EKG - 12/07/20-epic Stress Test -no  ECHO - 2016 Cardiac Cath - no Pacemaker/ICD device last checked:NA  Sleep Study - yes CPAP - yes  Fasting Blood Sugar - 110-120 Checks Blood Sugar _____ times a day. Every once in a while  Blood Thinner Instructions:NA Aspirin Instructions: Last Dose:  Anesthesia review: yes  Patient denies shortness of breath, fever, cough and chest pain at PAT appointment Pt has lost significant weight. She feels better and SOB is resolved. She uses a  cane at home and a wheelchair when out.  Patient verbalized understanding of instructions that were given to them at the PAT appointment. Patient was also instructed that they will need to review over the PAT instructions again at home before surgery. yes

## 2021-04-03 ENCOUNTER — Encounter (HOSPITAL_COMMUNITY): Payer: Self-pay | Admitting: Physician Assistant

## 2021-04-03 ENCOUNTER — Encounter (HOSPITAL_COMMUNITY): Payer: Self-pay | Admitting: Registered Nurse

## 2021-04-03 NOTE — Progress Notes (Signed)
Anesthesia Chart Review   Case: [id number] Date/Time: 04/12/21 0715   Procedure: TOTAL HIP ARTHROPLASTY ANTERIOR APPROACH (Left: Hip) - 150   Anesthesia type: Spinal   Pre-op diagnosis: Left hip avascular necrosis   Location: [facility] ROOM 07 / [facility]   Surgeons: Rod Can, MD       DISCUSSION:50 y.o. former smoker with h/o HTN, DM II (A1C 6.3), OSA on CPAP, asthma, C5-6 ACDF 11/05/14, left hip AVN scheduled for above procedure 04/12/2021 with Dr. Rod Can.   Pt with history of pseudocholinesterase deficiency per previous anesthesia note.  Evaluated by cardiology in the past. Cardiac cath 09/28/2016 with normal LV function, normal coronary arteries. Advised to follow up with cardio prn.   S/p laparoscopic lysis of adhesion 08/23/2020 with no anesthesia complications noted.  VS: BP 98/71   Pulse 81   Temp 36.7 C (Oral)   Resp 20   Ht 5\' 6"  (1.676 m)   Wt 85.3 kg   LMP 08/13/2019   SpO2 100%   BMI 30.34 kg/m   PROVIDERS: Benito Mccreedy, MD is PCP    LABS: Labs reviewed: Acceptable for surgery. (all labs ordered are listed, but only abnormal results are displayed)  Labs Reviewed  CBC - Abnormal; Notable for the following components:      Result Value   RBC 5.27 (*)    All other components within normal limits  COMPREHENSIVE METABOLIC PANEL - Abnormal; Notable for the following components:   Glucose, Bld 147 (*)    Creatinine, Ser 1.02 (*)    AST 12 (*)    All other components within normal limits  GLUCOSE, CAPILLARY - Abnormal; Notable for the following components:   Glucose-Capillary 165 (*)    All other components within normal limits  SURGICAL PCR SCREEN  PROTIME-INR  TYPE AND SCREEN     IMAGES:   EKG: 12/07/20 Rate 92 bpm  Sinus rhythm  Borderline right axis deviation  No acute changes No significant change since last tracing  CV: Echo 09/06/2014 - Left ventricle: The cavity size was normal. Wall thickness was    increased in a pattern of mild  LVH. Systolic function was normal.    The estimated ejection fraction was in the range of 50% to 55%.    Wall motion was normal; there were no regional wall motion    abnormalities. Doppler parameters are consistent with abnormal    left ventricular relaxation (grade 1 diastolic dysfunction).  Past Medical History:  Diagnosis Date   Anxiety    Arthritis    osteoarthritis    Asthma    has been eval. by Dr. Gwenette Greet- last 06/2014   Bipolar 1 disorder ([place])    Complication of anesthesia    09/13/14: malignant HTN with inducction, case cx but no sourse identified; 11/05/14 remifentanyl infusion used to attenuate hypergynamic induction   Depression    Diabetes mellitus    Hyperlipidemia    Hypertension    Knee pain, chronic    OSA on CPAP    doesn't use it every night   Pain in joint, ankle and foot 02/17/2013   PTSD (post-traumatic stress disorder)    Rotator cuff tear    bicep tendon     Past Surgical History:  Procedure Laterality Date   ANTERIOR CERVICAL DECOMP/DISCECTOMY FUSION N/A 11/05/2014   Procedure: ANTERIOR CERVICAL DECOMPRESSION/DISCECTOMY FUSION 1 LEVEL;  Surgeon: Consuella Lose, MD;  Location: [facility];  Service: Neurosurgery;  Laterality: N/A;  Cervical five- cervical seven anterior cervical  decompression with fusion interbody prosthesis plating and bonegraft   CARPAL TUNNEL RELEASE Left 2001   Left wrist   CERVICAL LAMINECTOMY  03/31/20176   CERVICAL 4 SEVEN LAMINECTOMIES WITH LATERAL FUSION   [place] Osteotomy w/ Bone Graft Right 09/30/2014   Rt foot @ [facility]   GASTROC RECESSION EXTREMITY Right 3//20/14   Right foot   HERNIA REPAIR     1996   LAPAROSCOPY N/A 08/23/2020   Procedure: LAPAROSCOPIC LYSIS OF ADHESIONS AND OPEN REMOVAL OF ABDOMINAL WALL MESH;  Surgeon: Clovis Riley, MD;  Location: [facility];  Service: General;  Laterality: N/A;  90   Lapidus fusion Right 01/10/12   Rt foot   MIDTARSAL ARTHRODESIS Right 07/17/12   Rt foot   POSTERIOR CERVICAL  FUSION/FORAMINOTOMY N/A 09/16/2015   Procedure: Cervical four - seven laminectomies with lateral mass fusion;  Surgeon: Consuella Lose, MD;  Location: [place] [facility];  Service: Neurosurgery;  Laterality: N/A;  C4-7 laminectomy with lateral mass fusion   Remove implant deep Right 07/17/12   Rt foot   Remove Implant Deep Right 07/22/2014   Rt foot @ [facility]   SHOULDER ARTHROSCOPY WITH ROTATOR CUFF REPAIR AND SUBACROMIAL DECOMPRESSION Left 07/31/2016   Procedure: LEFT SHOULDER ARTHROSCOPY WITH ROTATOR CUFF REPAIR;  Surgeon: Renette Butters, MD;  Location: [place];  Service: Orthopedics;  Laterality: Left;   TUBAL LIGATION      MEDICATIONS:  amLODipine (NORVASC) 10 MG tablet   atenolol-chlorthalidone (TENORETIC) 100-25 MG tablet   BD PEN NEEDLE NANO 2ND GEN 32G X 4 MM MISC   budesonide-formoterol (SYMBICORT) 160-4.5 MCG/ACT inhaler   capsaicin (ZOSTRIX) 0.025 % cream   cyclobenzaprine (FLEXERIL) 10 MG tablet   diazepam (VALIUM) 2 MG tablet   DULoxetine (CYMBALTA) 20 MG capsule   glucose blood (ACCU-CHEK AVIVA PLUS) test strip   LEVEMIR FLEXTOUCH 100 UNIT/ML FlexPen   mirtazapine (REMERON) 15 MG tablet   ondansetron (ZOFRAN ODT) 4 MG disintegrating tablet   oxyCODONE (ROXICODONE) 15 MG immediate release tablet   pregabalin (LYRICA) 25 MG capsule   VENTOLIN HFA 108 (90 Base) MCG/ACT inhaler   No current facility-administered medications for this encounter.    Konrad Felix Ward, PA-C [facility] [phone]

## 2021-04-03 NOTE — Anesthesia Preprocedure Evaluation (Deleted)
Anesthesia Evaluation    History of Anesthesia Complications (+) history of anesthetic complications (pt w pseudocholinesterase deficiecy)  Airway        Dental  (+) Chipped, Missing   Pulmonary asthma , sleep apnea and Continuous Positive Airway Pressure Ventilation , COPD, former smoker,           Cardiovascular hypertension, Pt. on medications + DVT       Neuro/Psych Anxiety    GI/Hepatic GERD  Medicated,  Endo/Other  diabetes, Well Controlled, Type 2  Renal/GU Lab Results      Component                Value               Date                      CREATININE               1.02 (H)            03/31/2021                BUN                      11                  03/31/2021                NA                       135                 03/31/2021                K                        3.9                 03/31/2021                CL                       99                  03/31/2021                CO2                      28                  03/31/2021                Musculoskeletal  (+) Arthritis , c5-6 ACDF   Abdominal (+) + obese,   Peds  Hematology Lab Results      Component                Value               Date                      WBC                      5.6                 03/31/2021  HGB                      14.1                03/31/2021                HCT                      44.3                03/31/2021                MCV                      84.1                03/31/2021                PLT                      165                 03/31/2021              Anesthesia Other Findings All: latex, PCN  Reproductive/Obstetrics                            Anesthesia Physical Anesthesia Plan  ASA: 3  Anesthesia Plan: Spinal   Post-op Pain Management:    Induction:   PONV Risk Score and Plan: Treatment may vary due to age or medical condition and  Ondansetron  Airway Management Planned: Natural Airway and Nasal Cannula  Additional Equipment: None  Intra-op Plan:   Post-operative Plan:   Informed Consent:     Dental advisory given  Plan Discussed with:   Anesthesia Plan Comments: (See PAT note 03/31/2021, Konrad Felix Ward, PA-C spinal)       Anesthesia Quick Evaluation

## 2021-04-04 DIAGNOSIS — M25512 Pain in left shoulder: Secondary | ICD-10-CM | POA: Diagnosis not present

## 2021-04-04 DIAGNOSIS — M79642 Pain in left hand: Secondary | ICD-10-CM | POA: Diagnosis not present

## 2021-04-04 DIAGNOSIS — G894 Chronic pain syndrome: Secondary | ICD-10-CM | POA: Diagnosis not present

## 2021-04-04 DIAGNOSIS — M79672 Pain in left foot: Secondary | ICD-10-CM | POA: Diagnosis not present

## 2021-04-04 DIAGNOSIS — M25511 Pain in right shoulder: Secondary | ICD-10-CM | POA: Diagnosis not present

## 2021-04-04 DIAGNOSIS — M25561 Pain in right knee: Secondary | ICD-10-CM | POA: Diagnosis not present

## 2021-04-04 DIAGNOSIS — Z79891 Long term (current) use of opiate analgesic: Secondary | ICD-10-CM | POA: Diagnosis not present

## 2021-04-04 DIAGNOSIS — M542 Cervicalgia: Secondary | ICD-10-CM | POA: Diagnosis not present

## 2021-04-04 DIAGNOSIS — M79671 Pain in right foot: Secondary | ICD-10-CM | POA: Diagnosis not present

## 2021-04-04 DIAGNOSIS — M79641 Pain in right hand: Secondary | ICD-10-CM | POA: Diagnosis not present

## 2021-04-04 DIAGNOSIS — M545 Low back pain, unspecified: Secondary | ICD-10-CM | POA: Diagnosis not present

## 2021-04-04 DIAGNOSIS — M25551 Pain in right hip: Secondary | ICD-10-CM | POA: Diagnosis not present

## 2021-04-10 ENCOUNTER — Other Ambulatory Visit: Payer: Self-pay | Admitting: *Deleted

## 2021-04-10 NOTE — Patient Outreach (Signed)
[facility]) Care Management  04/11/2021  Diana Henderson [birth year] [id number]   [facility] Unsuccessful outreach  Diana Henderson was referred to [facility] on 12/02/20 by Holland Falling MA Referral Reason: [facility] complex care-Tier 4/ [place] 5 ED visits, DM, COPD/Asthma, HTN Insurance: Holland Falling medicare    Diana Henderson is able to verify her HIPAA identifiers   Assessment  Outreach attempt to the home number  No answer. [facility] RN CM left HIPAA [facility] Portability and Accountability Act) compliant voicemail message along with CM's contact info.   Plan: [facility] RN CM scheduled this patient for another call attempt within 4-7 business days Unsuccessful outreach on 03/25/2021   Diana Walrath L. Lavina Hamman, RN, BSN, [place] Coordinator Office number [phone] Mobile number [phone]  Main [facility] number [phone] Fax number [phone]

## 2021-04-12 ENCOUNTER — Encounter (HOSPITAL_COMMUNITY): Admission: RE | Payer: Self-pay | Source: Home / Self Care

## 2021-04-12 ENCOUNTER — Ambulatory Visit (HOSPITAL_COMMUNITY): Admission: RE | Admit: 2021-04-12 | Payer: Medicare HMO | Source: Home / Self Care | Admitting: Orthopedic Surgery

## 2021-04-12 DIAGNOSIS — Z01818 Encounter for other preprocedural examination: Secondary | ICD-10-CM

## 2021-04-12 SURGERY — ARTHROPLASTY, HIP, TOTAL, ANTERIOR APPROACH
Anesthesia: Spinal | Site: Hip | Laterality: Left

## 2021-04-12 MED ORDER — ONDANSETRON HCL 4 MG/2ML IJ SOLN
INTRAMUSCULAR | Status: AC
  Filled 2021-04-12: qty 2

## 2021-04-12 MED ORDER — PROPOFOL 500 MG/50ML IV EMUL
INTRAVENOUS | Status: AC
  Filled 2021-04-12: qty 50

## 2021-04-18 DEATH — deceased

## 2021-04-19 ENCOUNTER — Other Ambulatory Visit: Payer: Self-pay | Admitting: *Deleted

## 2021-04-19 NOTE — Patient Outreach (Signed)
[place] [facility]) Care Management  04/19/2021  Aubriegh Minch [birth year] [id number]     [facility] second Unsuccessful outreach   Ms Graclyn Lawther was referred to [facility] on 12/02/20 by Holland Falling MA Referral Reason: [facility] complex care-Tier 4/ [employer] [place] 5 ED visits, DM, COPD/Asthma, HTN Insurance: Holland Falling medicare    Mrs Biebel is able to verify her HIPAA identifiers   Assessment   Outreach attempt to the home number  No answer. [facility] RN CM left HIPAA [facility] Portability and Accountability Act) compliant voicemail message along with CM's contact info.    Plan: [facility] RN CM scheduled this patient for another call attempt within 4-7 business days Unsuccessful outreach on 03/31/2021, 04/19/21  Unsuccessful letter mailed 04/19/21     Joelene Millin L. Lavina Hamman, RN, BSN, [place] Coordinator Office number [phone] Mobile number [phone]  Main [facility] number [phone] Fax number [phone]

## 2021-04-27 ENCOUNTER — Other Ambulatory Visit: Payer: Self-pay | Admitting: *Deleted

## 2021-04-27 NOTE — Patient Outreach (Signed)
[place] [facility]) Care Management  04/27/2021  Katheleen Stella [birth year] [id number]   [facility] third Unsuccessful outreach  Ms Clorene Nerio was referred to [facility] on 12/02/20 by Holland Falling MA Referral Reason: [facility] complex care-Tier 4/ [employer] [place] 5 ED visits, DM, COPD/Asthma, HTN Insurance: [employer] medicare    Mrs January is able to verify her HIPAA identifiers   Outreach attempt to the listed at the preferred outreach number in [facility]  No answer. [facility] RN CM left HIPAA [facility] Portability and Accountability Act) compliant voicemail message along with CM's contact info.   Plan: [facility] RN CM scheduled this patient for pending case closure  Unsuccessful outreach on 04/06/2021, 04/19/21, 04/27/21  Unsuccessful letter mailed 04/19/21   Joelene Millin L. Lavina Hamman, RN, BSN, [place] Coordinator Office number [phone] Mobile number [phone]  Main [facility] number [phone] Fax number [phone]

## 2021-05-15 ENCOUNTER — Other Ambulatory Visit: Payer: Self-pay | Admitting: *Deleted

## 2021-05-15 ENCOUNTER — Encounter: Payer: Self-pay | Admitting: *Deleted

## 2021-05-15 NOTE — Patient Outreach (Signed)
[place] [facility]) Care Management  05/15/2021  Shantae Vantol 11/24/1970 [id number]   [facility] Case closure   Unsuccessful outreach on 03/29/2021, 04/19/21, 04/27/21  Unsuccessful letter mailed 04/19/21 without a response   Plan [facility] RN CM will close case after no response from patient within 10 business days. Unable to maintain contact Case closure letters sent to patient and MD  Joelene Millin L. Lavina Hamman, RN, BSN, [place] Coordinator Office number [phone] Mobile number [phone]  Main [facility] number [phone] Fax number [phone]

## 2021-05-17 ENCOUNTER — Ambulatory Visit: Payer: Medicare HMO | Admitting: Plastic Surgery

## 2021-05-18 ENCOUNTER — Inpatient Hospital Stay (HOSPITAL_COMMUNITY): Admission: RE | Admit: 2021-05-18 | Payer: Medicaid Other | Source: Home / Self Care | Admitting: General Surgery

## 2021-05-18 ENCOUNTER — Encounter (HOSPITAL_COMMUNITY): Admission: RE | Payer: Self-pay | Source: Home / Self Care

## 2021-05-18 SURGERY — LAPAROTOMY, EXPLORATORY
Anesthesia: General

## 2022-12-24 IMAGING — MR MR CERVICAL SPINE W/O CM
6 of 9 series · 26 of 48 positions shown · non-contrast
Comparison: Prior head CT from earlier the same day.

CLINICAL DATA: Initial evaluation for neuro deficit, stroke
suspected, cervical radiculopathy.

EXAM:
MRI HEAD WITHOUT CONTRAST
MRI CERVICAL SPINE WITHOUT CONTRAST
TECHNIQUE: Multiplanar, multiecho pulse sequences of the brain and surrounding
structures, and cervical spine, to include the craniocervical
junction and cervicothoracic junction, were obtained without
intravenous contrast.

[Series 5: T2 · sagittal · 3.0mm · 0.69mm/px · 3 of 15 slices shown (1 of 2)]
[im 1/15]
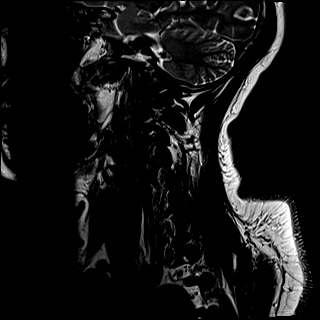
[im 8/15]
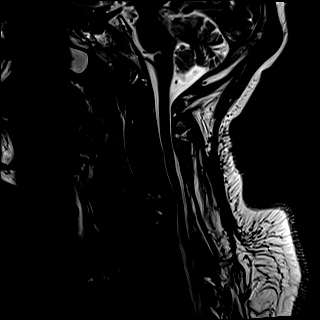
[im 15/15]
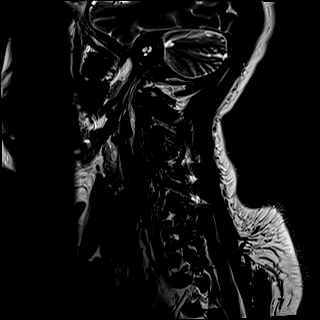

[Series 6: T1 · sagittal · 3.0mm · 0.69mm/px · 3 of 15 slices shown (1 of 2)]
[im 1/15]
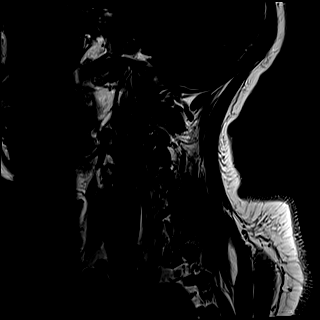
[im 8/15]
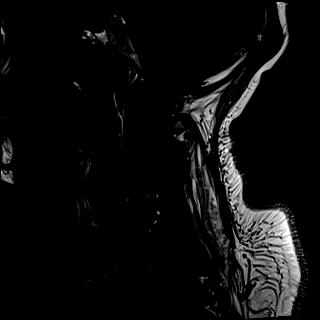
[im 15/15]
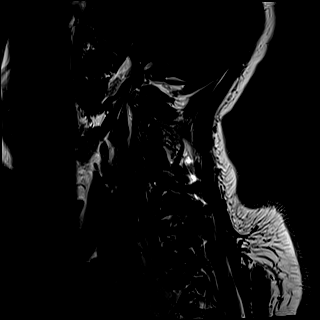

[Series 7: STIR · sagittal · 3.0mm · 0.86mm/px · 3 of 15 slices shown]
[im 1/15]
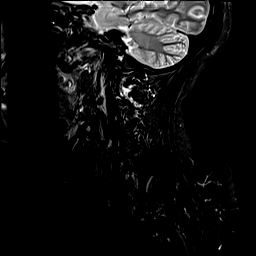
[im 8/15]
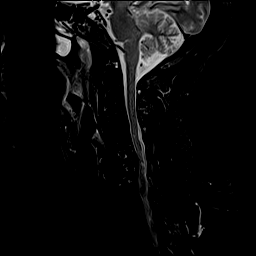
[im 15/15]
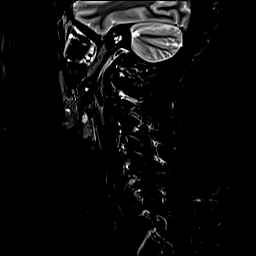

[Series 8: T2 · axial · 3.0mm · 0.70mm/px · z∈[-240,-115]mm · 7 of 38 slices shown (2 of 2)]
[im 1/38]
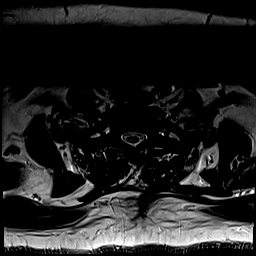
[im 7/38]
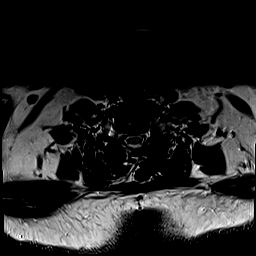
[im 13/38]
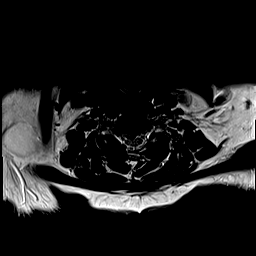
[im 19/38]
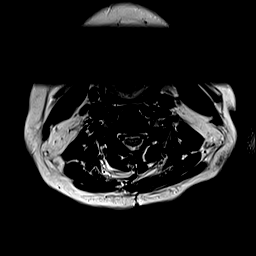
[im 25/38]
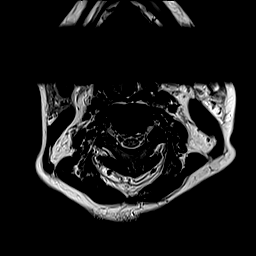
[im 31/38]
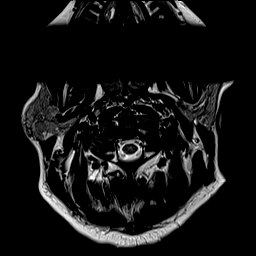
[im 38/38]
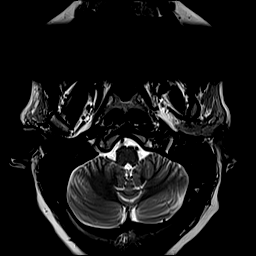

[Series 9: GRE · axial · 3.0mm · 0.35mm/px · z∈[-241,-194]mm · 3 of 30 slices shown]
[im 1/30]
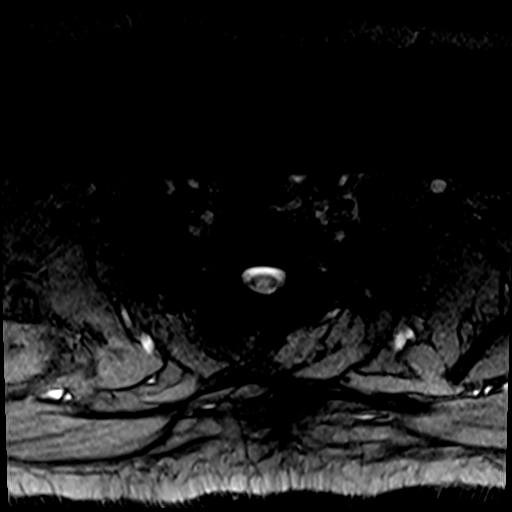
[im 8/30]
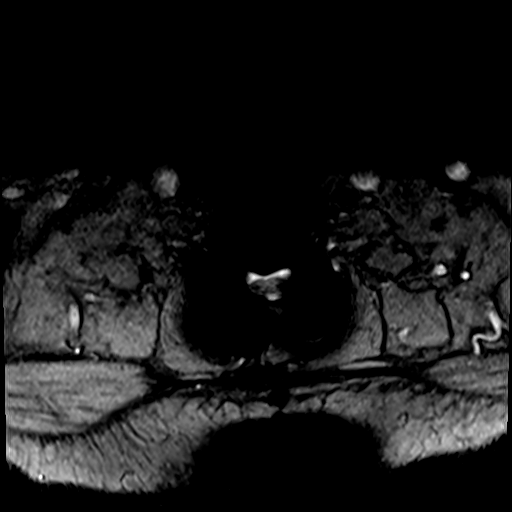
[im 15/30]
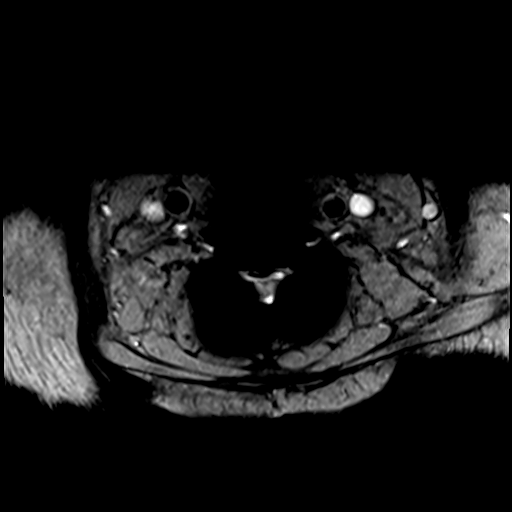

[Series 10: T1 · axial · 3.0mm · 0.35mm/px · z∈[-240,-115]mm · 7 of 38 slices shown (2 of 2)]
[im 1/38]
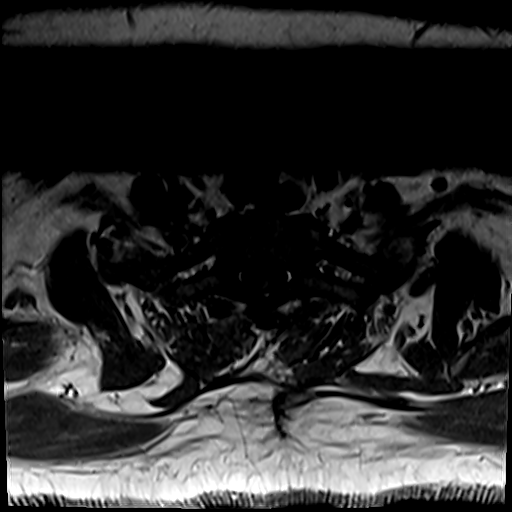
[im 7/38]
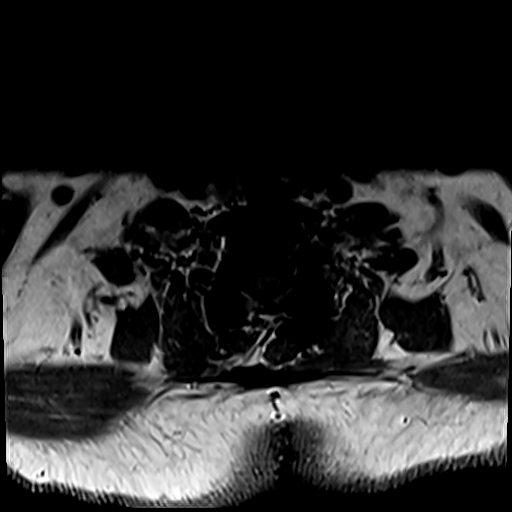
[im 13/38]
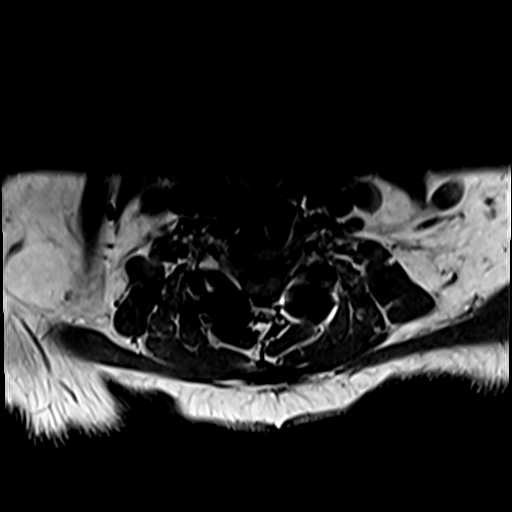
[im 19/38]
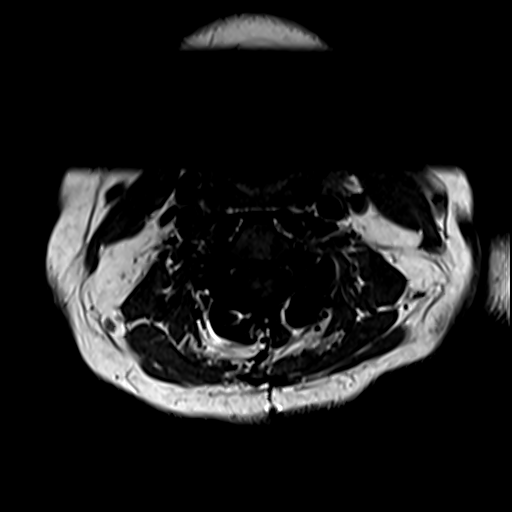
[im 25/38]
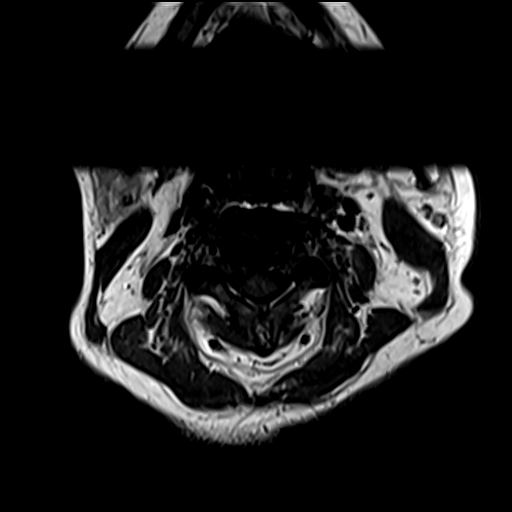
[im 31/38]
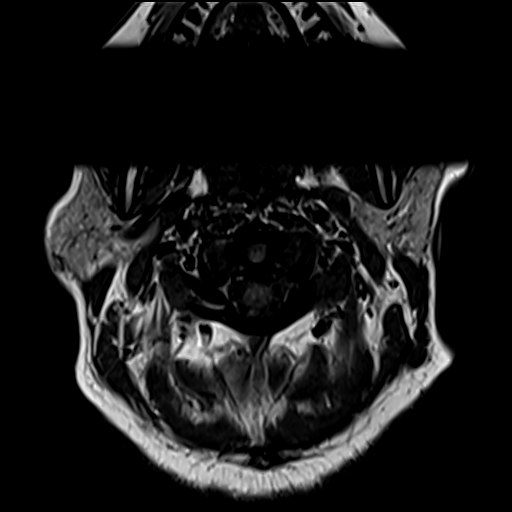
[im 38/38]
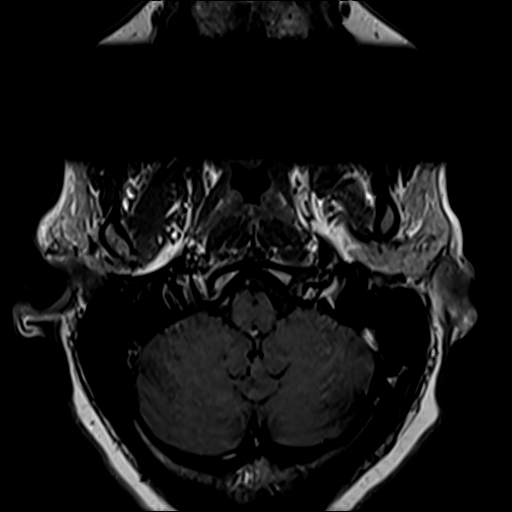

[26 of 48 positions shown; findings below may reference images not displayed]

FINDINGS: MRI HEAD FINDINGS

Brain: Cerebral volume within normal limits for age. Few scattered
foci of T2/FLAIR hyperintensity noted involving the periventricular
and deep white matter both cerebral hemispheres, nonspecific, but
most like related chronic microvascular ischemic disease, mild in
nature.

No abnormal foci of restricted diffusion to suggest acute or
subacute ischemia. Gray-white matter differentiation maintained. No
encephalomalacia to suggest chronic cortical infarction. No evidence
for acute or chronic intracranial hemorrhage.

No mass lesion, midline shift or mass effect. No hydrocephalus or
extra-axial fluid collection. Pituitary gland suprasellar region
within normal limits. Midline structures intact.

Vascular: Major intracranial vascular flow voids are maintained.
There is a clustered area of apparent vascular flow voids centered
near the left middle cerebellar peduncle (series 8, images 8, 9),
nonspecific, but could reflect a small vascular malformation.
Finding similar to perhaps slightly more prominent as compared to
previous exam.

Skull and upper cervical spine: Craniocervical junction within
normal limits. Bone marrow signal intensity normal. No scalp soft
tissue abnormality.

Sinuses/Orbits: Globes and orbital soft tissues demonstrate no acute
finding. Paranasal sinuses are largely clear. No significant mastoid
effusion. Inner ear structures grossly normal.

Other: None.

MRI CERVICAL SPINE FINDINGS

Alignment: Straightening of the normal cervical lordosis. No
listhesis.

Vertebrae: Susceptibility artifact related to prior ACDF at C5-6
with solid arthrodesis. Prior posterior fusion at C4-C7. Vertebral
body height maintained without acute or chronic fracture. Bone
marrow signal intensity somewhat diffusely decreased on T1 weighted
imaging, nonspecific, but most commonly related to anemia, smoking,
or obesity. No discrete or worrisome osseous lesions. No abnormal
marrow edema.

Cord: Flattening with question of subtle cord signal changes
involving the right hemi cord at the level of C5-6 (series 8, image
28). Signal intensity within the cord is otherwise within normal
limits.

Posterior Fossa, vertebral arteries, paraspinal tissues:
Craniocervical junction within normal limits. Paraspinous and
prevertebral soft tissues demonstrate no acute finding. Normal flow
voids seen within the vertebral arteries bilaterally.

Disc levels:

C2-C3: Unremarkable.

C3-C4: Mild disc bulge with uncovertebral hypertrophy. Flattening
and partial effacement of the ventral thecal sac without significant
spinal stenosis or cord impingement. Mild left greater than right C4
foraminal stenosis related to disc bulge and uncovertebral
hypertrophy.

C4-C5: Right paracentral disc extrusion and/or endplate spurring
indents the ventral thecal sac. Associated inferior migration
posterior to the C5 and C6 vertebral bodies. No more than mild
flattening of the right hemi cord at the level of C4-5. Prior
posterior decompression with wide patency of the thecal sac.
Foramina remain patent.

C5-C6: Prior anterior and posterior fusion with posterior
decompression. Right paracentral spurring seen posterior to the C5
and C6 vertebral bodies indents the right ventral thecal sac,
contacting and flattening the right hemicord (series 8, image 27).
Question associated subtle myelomalacia at the level of C5-6 (series
8, image 28). Thecal sac remains patent given the decompression. No
significant foraminal stenosis.

C6-C7: Negative interspace. Prior posterior decompression. No canal
or foraminal stenosis.

C7-T1: Negative interspace. Mild facet hypertrophy. No canal or
foraminal stenosis.

Visualized upper thoracic spine demonstrates no significant finding.
IMPRESSION: MRI HEAD IMPRESSION:

1. No acute intracranial abnormality.
2. Mild cerebral white matter disease, nonspecific, but most likely
related to mild chronic microvascular ischemic disease.
3. Small 11 mm cluster of apparent vascular flow voids centered at
the left middle cerebellar peduncle, nonspecific, but could reflect
a small vascular malformation/AVM. Finding is similar to perhaps
slightly more prominent as compared to previous MRI from 4692.
Finding could be further assessed with dedicated vascular imaging of
the brain as clinically warranted.

MRI CERVICAL SPINE IMPRESSION:

1. Postoperative changes including prior ACDF at C5-6 with posterior
fusion and decompression at C4-C7.
2. Right disc extrusion and/or osseous spurring at the C5-C6 level,
contacting and flattening the right hemicord. Question associated
subtle myelomalacia at this level. Overall, appearance is similar to
previous.
3. Additional mild spondylosis elsewhere within the cervical spine
as above. No other significant stenosis or impingement.
